# Patient Record
Sex: Male | Born: 1940
Health system: Southern US, Community
[De-identification: ages and names within clinical notes are randomized; demographics above are authoritative.]

## PROBLEM LIST (undated history)

## (undated) DIAGNOSIS — M109 Gout, unspecified: Secondary | ICD-10-CM

## (undated) DIAGNOSIS — T4145XA Adverse effect of unspecified anesthetic, initial encounter: Secondary | ICD-10-CM

## (undated) DIAGNOSIS — Z8719 Personal history of other diseases of the digestive system: Secondary | ICD-10-CM

## (undated) DIAGNOSIS — I5042 Chronic combined systolic (congestive) and diastolic (congestive) heart failure: Secondary | ICD-10-CM

## (undated) DIAGNOSIS — M1990 Unspecified osteoarthritis, unspecified site: Secondary | ICD-10-CM

## (undated) DIAGNOSIS — I4510 Unspecified right bundle-branch block: Secondary | ICD-10-CM

## (undated) DIAGNOSIS — T8859XA Other complications of anesthesia, initial encounter: Secondary | ICD-10-CM

## (undated) DIAGNOSIS — I10 Essential (primary) hypertension: Secondary | ICD-10-CM

## (undated) DIAGNOSIS — Z87442 Personal history of urinary calculi: Secondary | ICD-10-CM

## (undated) DIAGNOSIS — I480 Paroxysmal atrial fibrillation: Secondary | ICD-10-CM

## (undated) DIAGNOSIS — Z8679 Personal history of other diseases of the circulatory system: Secondary | ICD-10-CM

## (undated) DIAGNOSIS — K5790 Diverticulosis of intestine, part unspecified, without perforation or abscess without bleeding: Secondary | ICD-10-CM

## (undated) DIAGNOSIS — I452 Bifascicular block: Secondary | ICD-10-CM

## (undated) DIAGNOSIS — Z7901 Long term (current) use of anticoagulants: Secondary | ICD-10-CM

## (undated) HISTORY — PX: AORTIC VALVE REPLACEMENT: SHX41

## (undated) HISTORY — DX: Gout, unspecified: M10.9

## (undated) HISTORY — DX: Chronic combined systolic (congestive) and diastolic (congestive) heart failure: I50.42

## (undated) HISTORY — DX: Personal history of urinary calculi: Z87.442

## (undated) HISTORY — DX: Paroxysmal atrial fibrillation: I48.0

## (undated) HISTORY — PX: CATARACT EXTRACTION W/ INTRAOCULAR LENS  IMPLANT, BILATERAL: SHX1307

---

## 1999-07-13 ENCOUNTER — Ambulatory Visit (HOSPITAL_COMMUNITY): Admission: RE | Admit: 1999-07-13 | Discharge: 1999-07-13 | Payer: Self-pay | Admitting: Family Medicine

## 2002-07-30 ENCOUNTER — Ambulatory Visit (HOSPITAL_COMMUNITY): Admission: RE | Admit: 2002-07-30 | Discharge: 2002-07-30 | Payer: Self-pay | Admitting: Family Medicine

## 2002-07-30 ENCOUNTER — Encounter: Payer: Self-pay | Admitting: Cardiology

## 2003-04-09 ENCOUNTER — Encounter: Payer: Self-pay | Admitting: Cardiothoracic Surgery

## 2003-04-13 ENCOUNTER — Inpatient Hospital Stay (HOSPITAL_COMMUNITY): Admission: RE | Admit: 2003-04-13 | Discharge: 2003-04-23 | Payer: Self-pay | Admitting: Cardiothoracic Surgery

## 2003-04-14 ENCOUNTER — Encounter: Payer: Self-pay | Admitting: Cardiothoracic Surgery

## 2003-04-14 ENCOUNTER — Encounter (INDEPENDENT_AMBULATORY_CARE_PROVIDER_SITE_OTHER): Payer: Self-pay | Admitting: Specialist

## 2003-04-15 ENCOUNTER — Encounter: Payer: Self-pay | Admitting: Cardiothoracic Surgery

## 2003-04-16 ENCOUNTER — Encounter: Payer: Self-pay | Admitting: Cardiothoracic Surgery

## 2003-04-17 ENCOUNTER — Encounter: Payer: Self-pay | Admitting: Cardiothoracic Surgery

## 2003-05-01 ENCOUNTER — Encounter: Payer: Self-pay | Admitting: Emergency Medicine

## 2003-05-01 ENCOUNTER — Inpatient Hospital Stay (HOSPITAL_COMMUNITY): Admission: EM | Admit: 2003-05-01 | Discharge: 2003-05-04 | Payer: Self-pay | Admitting: Emergency Medicine

## 2003-05-26 ENCOUNTER — Inpatient Hospital Stay (HOSPITAL_COMMUNITY): Admission: AD | Admit: 2003-05-26 | Discharge: 2003-06-05 | Payer: Self-pay | Admitting: Internal Medicine

## 2003-10-04 ENCOUNTER — Inpatient Hospital Stay (HOSPITAL_COMMUNITY): Admission: RE | Admit: 2003-10-04 | Discharge: 2003-10-14 | Payer: Self-pay | Admitting: Surgery

## 2003-10-05 ENCOUNTER — Encounter (INDEPENDENT_AMBULATORY_CARE_PROVIDER_SITE_OTHER): Payer: Self-pay | Admitting: Specialist

## 2003-10-06 ENCOUNTER — Encounter (INDEPENDENT_AMBULATORY_CARE_PROVIDER_SITE_OTHER): Payer: Self-pay | Admitting: *Deleted

## 2003-10-06 HISTORY — PX: HEMICOLECTOMY: SHX854

## 2006-05-29 ENCOUNTER — Ambulatory Visit: Payer: Self-pay | Admitting: Internal Medicine

## 2006-07-04 ENCOUNTER — Ambulatory Visit: Payer: Self-pay | Admitting: Internal Medicine

## 2006-07-12 ENCOUNTER — Ambulatory Visit: Payer: Self-pay | Admitting: Pulmonary Disease

## 2006-07-19 ENCOUNTER — Inpatient Hospital Stay (HOSPITAL_COMMUNITY): Admission: AD | Admit: 2006-07-19 | Discharge: 2006-07-24 | Payer: Self-pay | Admitting: *Deleted

## 2006-07-23 ENCOUNTER — Encounter (INDEPENDENT_AMBULATORY_CARE_PROVIDER_SITE_OTHER): Payer: Self-pay | Admitting: Specialist

## 2006-07-23 HISTORY — PX: LAPAROSCOPIC CHOLECYSTECTOMY: SUR755

## 2006-08-07 ENCOUNTER — Ambulatory Visit: Payer: Self-pay | Admitting: Internal Medicine

## 2006-08-08 ENCOUNTER — Ambulatory Visit: Payer: Self-pay | Admitting: Cardiology

## 2006-08-27 ENCOUNTER — Ambulatory Visit: Payer: Self-pay | Admitting: Internal Medicine

## 2006-10-24 ENCOUNTER — Ambulatory Visit: Payer: Self-pay | Admitting: Internal Medicine

## 2007-09-24 ENCOUNTER — Encounter: Payer: Self-pay | Admitting: Internal Medicine

## 2007-11-04 DIAGNOSIS — K219 Gastro-esophageal reflux disease without esophagitis: Secondary | ICD-10-CM | POA: Insufficient documentation

## 2007-11-04 DIAGNOSIS — R05 Cough: Secondary | ICD-10-CM

## 2007-11-04 DIAGNOSIS — I10 Essential (primary) hypertension: Secondary | ICD-10-CM | POA: Insufficient documentation

## 2007-11-04 DIAGNOSIS — R059 Cough, unspecified: Secondary | ICD-10-CM | POA: Insufficient documentation

## 2007-11-04 DIAGNOSIS — J310 Chronic rhinitis: Secondary | ICD-10-CM | POA: Insufficient documentation

## 2007-11-05 ENCOUNTER — Ambulatory Visit: Payer: Self-pay | Admitting: Internal Medicine

## 2008-04-28 ENCOUNTER — Encounter: Payer: Self-pay | Admitting: Cardiology

## 2008-04-28 HISTORY — PX: TRANSTHORACIC ECHOCARDIOGRAM: SHX275

## 2010-04-21 ENCOUNTER — Ambulatory Visit: Payer: Self-pay | Admitting: Cardiology

## 2010-08-13 ENCOUNTER — Encounter: Payer: Self-pay | Admitting: Internal Medicine

## 2010-12-09 NOTE — Op Note (Signed)
[name], [name]              ACCOUNT NO.:  [id number]   MEDICAL RECORD NO.:  [id number]          PATIENT TYPE:  INP   LOCATION:  5732                         FACILITY:  [facility]   PHYSICIAN:  Lebron Conners, M.D.   DATE OF BIRTH:  05/16/1941   DATE OF PROCEDURE:  07/23/2006  DATE OF DISCHARGE:                               OPERATIVE REPORT   PREOPERATIVE DIAGNOSIS:  Cholelithiasis, recent pancreatitis.   POSTOPERATIVE DIAGNOSIS:  Cholelithiasis, recent pancreatitis.   OPERATION:  Laparoscopic cholecystectomy with operative cholangiogram.   SURGEON:  Lebron Conners, M.D.   ANESTHESIA:  General and local.   BLOOD LOSS:  Minimal.   COMPLICATIONS:  None.   SPECIMEN:  Gallbladder to the lab.   The patient to PACU in excellent condition.   PROCEDURE:  After the patient was monitored and asleep and had routine  preparation and draping of the abdomen, I infused the area just below  the umbilicus with local anesthetic and then made about a 3 cm  transverse incision, dissected down through the fat and cut the fascia  in the midline for about 2 cm then bluntly entered the peritoneal  cavity.  I saw some adhesions in the right lower quadrant but no other  adhesions.  I saw that after putting in the scope which was secured with  a 0 Vicryl pursestring suture in the fascia utilizing a Hassan cannula.  The gallbladder did not appear acutely inflamed.  There was some  swelling of the retroperitoneum.  I put in three additional laparoscopic  ports under direct view through anesthetized sites.  Then with the  patient positioned head-up, foot down and rotated to the left, I  retracted the fundus of the gallbladder toward the head and pulled the  infundibulum laterally.  No adhesions were present.  The hepatoduodenal  ligament was quite fatty, but I dissected it until I saw the cystic duct  and the cystic artery quite clearly.  I clipped and divided the cystic  artery, then I clipped the  cystic duct as it emerged from the  infundibulum and made a small cut in it just distal to that and  cannulated it with a Cook cholangiogram catheter.  Utilizing  fluoroscopy, I performed a cholangiogram, demonstrated normal-sized  ducts with free flow into the duodenum and no evident filling defects.  I then removed the cholangiogram catheter and clipped the distal cystic  duct with three clips and divided it.  I removed the gallbladder from  the liver utilizing the cautery and mostly using the hook dissection.  I  accidentally made a couple of holes in the gallbladder and spilled quite  a bit of bile and a few small gallstones.  I took care to collect all  the spillage.  After detaching the gallbladder from the liver, I placed  it in a plastic pouch and held it above the liver while I copiously  irrigated the area and removed the irrigant.  I was satisfied with  hemostasis, but because of that need to reinstitute heparinization in  the immediate postop period, I packed the gallbladder fossa with  Surgicel.  The sponge, needle and instrument counts were correct.  I  removed the gallbladder from the abdomen through the umbilical incision  and tied the  pursestring suture.  I saw no bleeding from that site and saw that no  viscera were trapped.  I removed the two lateral ports under direct view  and saw no bleeding.  After allowing the carbon dioxide to escape, I  removed the epigastric port.  I closed all skin incisions with  intracuticular 4-0 Vicryl and Steri-Strips.  He tolerated the operation  well.      Lebron Conners, M.D.  Electronically Signed     WB/MEDQ  D:  07/23/2006  T:  07/23/2006  Job:  [id number]

## 2010-12-09 NOTE — H&P (Signed)
NAME:  Adrian Lambert, Adrian Lambert                        ACCOUNT NO.:  [id number]   MEDICAL RECORD NO.:  [id number]                   PATIENT TYPE:  INP   LOCATION:  NA                                   FACILITY:  [facility]   PHYSICIAN:  Peter M. Swaziland, M.D.               DATE OF BIRTH:  [birth year]   DATE OF ADMISSION:  04/13/2003  DATE OF DISCHARGE:                                HISTORY & PHYSICAL   HISTORY OF PRESENT ILLNESS:  The patient is a 70 year old white male who in  general has been in excellent health.  Approximately eight months ago he was  noted to have a murmur.  This led to an echocardiogram in January 2004 which  demonstrated normal left ventricular function.  The aortic valve was  thickened.  It was a suboptimal study and Doppler demonstrated a peak aortic  valve gradient of 30 mmHg and a mean gradient of 20 mmHg.  At that time his  valve area was estimated at 1 cm squared.  Since that time the patient has  been followed medically.  More recently, he has been noticing that he gets  short of breath when he climbs up steps or walks up a hill or has an  extended walk.  Sometimes when he is playing with his grandchildren or  lifting something he will also get tightness in his chest.  He occasionally  gets lightheaded if he bends over or gets up too quickly.  He was re-  evaluated with an echocardiogram on February 04, 2003.  This showed a  significant increase in his aortic valve gradient to a peak gradient to 69  mmHg, a mean gradient of 40 mmHg and an aortic valve area of 0.52 cm  squared.  This was felt to be a technically adequate study and superior to  his prior study.  Given these findings of critical aortic stenosis and new  symptoms, it was recommended that he be considered for aortic valve  replacement.  He subsequently was seen by Dr. Tyrone Sage and is now admitted  for cardiac catheterization with subsequent aortic valve surgery.   PAST MEDICAL HISTORY:  1. History of  hypertension.  2. History of kidney stones.  3. He has no history of hypercholesterolemia or diabetes.   ALLERGIES:  He has no known allergies.   CURRENT MEDICATIONS:  1. Norvasc 10 mg per day.  2. Avapro 150 mg per day.  3. Ibuprofen 800 mg per day.   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  He is married  and has two children.  He denies tobacco use.  He drinks a couple of beers  on the weekends.   FAMILY HISTORY:  Father is age 5 and has hypertension.  Mother is age 28  with a history of coronary artery disease and diabetes.  Two siblings are  alive and well.   REVIEW OF  SYMPTOMS:  VASCULAR:  The patient has had no history of  claudication.  NEUROLOGIC:  He has no history of TIA or stroke.  HEMATOLOGIC:  No history of bleeding difficulties.  He does have a history  of gout.   PHYSICAL EXAMINATION:  GENERAL:  The patient is a pleasant white male in no  distress.  He is obese.  VITAL SIGNS:  Weight is 245, blood pressure 138/80, pulse 70 and regular.  HEENT EXAM:  Pupils equal, round and reactive to light and accommodation.  Extraocular movements are full.  The oropharynx is clear.  The neck is  without JVD, adenopathy or bruits.  Carotid upstrokes are delayed.  LUNGS:  Clear.  CARDIAC EXAM:  Regular rate and rhythm with a harsh grade 2/6 systolic  murmur heard in the aortic area radiating to the carotids and left sternal  border.  There is no S3.  ABDOMEN:  Obese, soft and non-tender.  There are no masses or  hepatosplenomegaly.  Femoral and pedal pulses are 2+ and symmetric.  He is  without edema.  NEUROLOGIC EXAM:  Nonfocal.   LABORATORY DATA:  Resting EKG is normal.  Chest x-ray shows cardiomegaly and  is otherwise normal.  Subsequent laboratories are pending.   IMPRESSION:  1. Critical aortic stenosis now with symptoms of dyspnea and occasional     chest pain.  2. Hypertension.   PLAN:  Admit for cardiac catheterization and plans for aortic  valve  replacement on April 14, 2003 by Dr. Tyrone Sage.                                                Peter M. Swaziland, M.D.    PMJ/MEDQ  D:  04/09/2003  T:  04/10/2003  Job:  [id number]   cc:   Caryn Bee L. Little, M.D.  [address]  [place]  [place] [address]  Fax: [phone]   Gwenith Daily. Tyrone Sage, M.D.  [address]  [place]  [place] [address]

## 2010-12-09 NOTE — H&P (Signed)
NAME:  Adrian Lambert, Adrian Lambert              ACCOUNT NO.:  [id number]   MEDICAL RECORD NO.:  [id number]          PATIENT TYPE:  INP   LOCATION:  5707                         FACILITY:  [facility]   PHYSICIAN:  Corinna L. Lendell Caprice, MDDATE OF BIRTH:  1941-05-19   DATE OF ADMISSION:  07/19/2006  DATE OF DISCHARGE:                              HISTORY & PHYSICAL   CHIEF COMPLAINT:  Pancreatitis.   HISTORY OF PRESENT ILLNESS:  Adrian Lambert is a pleasant 70 year old white  male patient who was diagnosed with acute pancreatitis as an outpatient  and has been directly admitted for treatment, and he reports having  sudden onset of upper abdominal pain starting last night.  He has also  had several episodes of vomiting.  He has been unable to keep much down.  He has no previous history of pancreatitis.  He drinks several drinks a  day.  Denies a binge of alcohol recently.  He has no previous liver  disease.  He had laboratories as an outpatient, which showed elevated  amylase, lipase and liver function tests.  He also had an ultrasound  done at [facility], which showed mild gallbladder sludge, no  gallstones, and a normal common duct caliber.  He also reportedly had a  CT of the abdomen.  Wet reading is reportedly unremarkable.   PAST MEDICAL HISTORY:  1. 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  2. 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  3. History of postoperative atrial fibrillation in 2004.  4. History of gout.  5. Hypertension.  6. Benign prostatic hypertrophy.  7. Pulmonary fibrosis.  8. Chronic cough, for which she sees Dr. Sherene Sires.   MEDICATIONS:  1. Exforge 5/160 mg a day.  2. Colchicine as needed.  3. Benefiber as needed.  4. Coumadin 5 mg nightly except for Saturday, 2.5 mg.  5. UroXatral 10 mg a day.   SOCIAL HISTORY:  The patient drinks 2-6 drinks on occasion.  He does not  smoke.  He is married  and is here with his daughter, who is a short Engineer, mining.   FAMILY HISTORY:  Reviewed and as per previous dictations.   REVIEW OF SYSTEMS:  As above.   PHYSICAL EXAMINATION:  VITAL SIGNS:  His temperature is 101.2, pulse  112, respiratory rate 20, blood pressure 90/69.  GENERAL:  The patient is well-nourished, well-developed, in a mild  amount of pain.  HEENT:  Normocephalic, atraumatic.  Pupils equal, round, reactive to  light.  Moist mucous membranes.  NECK:  Supple.  No JVD.  No carotid bruits.  LUNGS:  Clear to auscultation bilaterally without wheezes, rhonchi or  rales.  CARDIOVASCULAR:  Regular rate and rhythm.  Has has a mechanical click  and murmur.  ABDOMEN:  Normal bowel sounds, soft.  He does have some voluntary  guarding and tenderness in the upper abdominal area.  GU, RECTAL:  Deferred.  EXTREMITIES:  No clubbing, cyanosis or edema.  SKIN:  No rash.  PSYCHIATRIC:  Normal affect.  NEUROLOGIC:  Alert and oriented.  Cranial nerves and  sensorimotor exam  are intact.   Labs from the office show an amylase of greater than 900, lipase of  greater than 400.  Total protein 6.2, albumin 4.4, total bilirubin 3.1,  direct bilirubin 1.5, alkaline phosphatase 110, AST 285, ALT 163.  White  blood cell count 18.8 with 91% neutrophils, hemoglobin 13.8, hematocrit  43, platelet count 217.  Ultrasound from [facility] shows  mild gallbladder sludge, no gallstones.  CT scan from [facility]:  Official report is pending.   ASSESSMENT AND PLAN:  1. Acute pancreatitis, consider alcohol-related, consider a passed      gallstone.  I will repeat his labs here.  If he continues to be      febrile, may need to start empiric antibiotics.  Get an MRCP due to      his mechanical aortic valve and if there is concern about      obstruction, he may need a GI consult to evaluate for possible      ERCP.  I will also check a triglyceride level.  2. Aortic valve replacement.   I will check INR and continue Coumadin.  3. Hypertension.  Hold his medications for now.  4. History of gout, quiescent.  5. Benign prostatic hypertrophy.      Corinna L. Lendell Caprice, MD  Electronically Signed     CLS/MEDQ  D:  07/20/2006  T:  07/20/2006  Job:  [id number]   cc:   Caryn Bee L. Little, M.D.

## 2010-12-09 NOTE — Op Note (Signed)
NAME:  Adrian Lambert, Adrian Lambert                        ACCOUNT NO.:  [id number]   MEDICAL RECORD NO.:  [id number]                   PATIENT TYPE:  INP   LOCATION:  2315                                 FACILITY:  [facility]   PHYSICIAN:  Gwenith Daily. Tyrone Sage, M.D.            DATE OF BIRTH:  [birth year]   DATE OF PROCEDURE:  04/14/2003  DATE OF DISCHARGE:                                 OPERATIVE REPORT   PREOPERATIVE DIAGNOSES:  Critical aortic stenosis.   POSTOPERATIVE DIAGNOSES:  Critical aortic stenosis.   OPERATION PERFORMED:  Aortic valve replacement with a #25 aortic mechanical  valve prosthesis.   SURGEON:  Gwenith Daily. Tyrone Sage, M.D.   ASSISTANT:  Salvatore Decent. Dorris Fetch, M.D.   ANESTHESIA:  General.   INDICATIONS FOR PROCEDURE:  The patient is a 70 year old male who has had a  known murmur for several years, who presents with increasing shortness of  breath and fatigue with serial echocardiograms done over the past 12 to 18  months that showed progression of critical aortic stenosis.  Over  approximately a nine to 10 month period, his aortic valve area went from 1.2  to 0.5.  With his increased symptoms and progression of aortic stenosis,  aortic valve replacement was recommended to the patient. He was admitted for  cardiac catheterization to rule out concomitant coronary artery disease.  This was done by Dr. Swaziland demonstrating the valve was unable to be  crossed.  The coronaries were clear of significant obstructive disease.  The  patient had no contraindications to Coumadin and after discussion preferred  mechanical valve prosthesis and signed informed consent.   DESCRIPTION OF PROCEDURE:  With Swann-Ganz and arterial line monitors in  place, the patient underwent general endotracheal anesthesia without  incident.  The skin of the chest and legs was prepped with Betadine and  draped in the usual sterile manner.  A median sternotomy was performed.  The  pericardium was opened.  The  patient had evidence of left ventricular  hypertrophy.  The patient was systemically heparinized.  The ascending aorta  and the right atrium were cannulated in the aortic root.  A bent  cardioplegia needle was introduced into the ascending aorta.  The patient  was placed on cardiopulmonary bypass at 2.4L per minute per meter squared.  A right superior pulmonary vein vent was then placed.  The patient's body  temperature was cooled to 30 degrees.  An aortic crossclamp was applied.  of cold blood potassium cardioplegia was administered with rapid  diastolic arrest of the heart.  Myocardial septal temperature was monitored  throughout the crossclamp period.  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  The right  side cuff was also severely limited but to a lesser degree.  The valve was  excised, the annulus debrided.  The valve was sized with a 25 St. Jude  mechanical aortic valve prosthesis, model [id number], serial  [id number].  With  #2 Tycron pledgeted sutures on the ventricular surface were placed  circumferentially in the annulus.  With these sutures, the valve was then  secured in place and seated well with free movement of the leaflets.  There  was no impingement on the left or right coronary ostium.  The aortotomy was  then closed with horizontal mattress suture with felt strips.  Prior to  complete closure of the aortotomy, the heart was allowed to passively fill  and deair.  Aortic cross-clamp was removed.  Total crossclamp time was 80  minutes.  A 16 gauge needle was introduced into the left ventricular apex to  deair the heart.  The patient initially was in complete heart block;  however, prior to the end of the case, he converted to a sinus rhythm.  Atrial and ventricular pacing wires were applied.  The patient was rewarmed  to 37 degrees.   He was then ventilated and weaned from cardiopulmonary  bypass without difficulty.  He remained hemodynamically stable, was  decannulated in the usual fashion.  Protamine sulfate was administered.  With the operative field hemostatic, two atrial and two ventricular pacing  wires were applied.  Graft markers were applied.  A left pleural tube and  two mediastinal tubes were left in place.  Sternum was closed with #6  stainless steel wire.  Fascia closed with interrupted 0 Vicryl, running 3-0  Vicryl in the subcutaneous tissues and 4-0 subcuticular stitch in the skin  edges.  Dry dressings were applied.  Sponge and needle counts were reported  as correct at the completion of the procedure.  The patient tolerated the  procedure without obvious complication and was transferred to the surgical  intensive care unit for further postoperative care.                                                 Gwenith Daily Tyrone Sage, M.D.    Tyson Babinski  D:  04/15/2003  T:  04/15/2003  Job:  [id number]   cc:   Peter M. Swaziland, M.D.  [address]  [place], [place] [address]  Fax: [phone]

## 2010-12-09 NOTE — Op Note (Signed)
NAME:  Adrian Lambert, Adrian Lambert                        ACCOUNT NO.:  [id number]   MEDICAL RECORD NO.:  [id number]                   PATIENT TYPE:  INP   LOCATION:  2315                                 FACILITY:  [facility]   PHYSICIAN:  Burna Forts, M.D.             DATE OF BIRTH:  1941-06-14   DATE OF PROCEDURE:  04/14/2003  DATE OF DISCHARGE:                                 OPERATIVE REPORT   PROCEDURE PERFORMED:  Intraoperative transesophageal echocardiogram.   INDICATIONS FOR PROCEDURE:  Adrian Lambert is a 70 year old gentleman, a  patient of Edward B. Tyrone Sage, M.D., who presents for aortic valve  replacement.  The morning of surgery, he was brought to the holding area  where under local anesthesia with sedation, the pulmonary artery catheter  and radial arterial lines are inserted.  He was then transported to the  operating room for routine induction of general anesthesia, whereupon the  trachea was intubated, the TE probe was then lubricated and passed  oropharyngeally into the stomach and then withdrawn for imaging of the  cardiac structures.   PRECARDIOPULMONARY BYPASS TRANSESOPHAGEAL ECHOCARDIOGRAM EXAMINATION:  Left ventricle:  This is a minimally thickened left ventricular chamber.  All segments appear to contract in a short axis view; however, I am  impressed by what appears to be global depression of the cardiac function in  this short axis view.  Long axis view again reveals anterior and posterior  walls that are contractile but globally depressed in their function.  Papillary muscles are well outlined and seen.  In the long axis view, peak  and mean gradients are measured at about 48 and mean about 33.  The aortic  valve area is calculated at about 0.6.   Mitral valve:  This is a thin, compliant, mobile mitral valve apparatus.  Coaptation appears to take place below the level of the annulus as is  suspected.  There is a trace branch of regurgitant flow noted.   Aortic  valve:  This is a heavily calcified aortic valve apparatus.  It is  essentially bicuspid in its shape and function.  There is a noncoronary  leaflet portion that appears to be mobile.  The other what would have been  right and left leaflets which are either fused or a single leaflet are  essentially immobile.  On the long axis view there is 4+ turbulent flow  across the valve.  There is no aortic regurgitant flow noted.  Aortic valve  area by planimetry is again approximately 0.6 to 0.7 by planimetry.   Left atrium:  Normal left atrial chamber.  The appendage is seen and masses  are noted within.   Right ventricle, tricuspid valve and right atrium:  Essentially normal.   The patient was placed on cardiopulmonary bypass, hypothermia was begun. The  diseased aortic valve was excised and replaced with a #25 St. Jude  bivalvular apparatus.  Rewarming and deairing maneuvers were  carried out.  The patient then separated from cardiopulmonary bypass with the initial  attempt.   POST CARDIOPULMONARY BYPASS TRANSESOPHAGEAL ECHOCARDIOGRAM EXAMINATION  (LIMITED EXAM):  Left ventricle:  The left ventricular contractility is as previously  described which is globally depressed in the early postoperative period.  However, with the addition of inotropes and pacing, there does appear to be  improvement of the global left ventricular function in a short axis view.  Again, all segments appear to be moving inward and contractile.   Aortic valve:  Replacement of diseased aortic valve could now be seen.  The  bivalvular leaflets of this St. Jude apparatus.  The valve itself appeared  to be seated well in the appropriate fashion, appeared to be functioning  entirely in a normal manner.  Leaflets were mobile and the long axis view  revealed normal flow across this valve with essentially no regurgitant flow  noted.   The rest of the cardiac exam was as previously described and the patient  returned to the  cardiac intensive care unit in stable condition.                                               Burna Forts, M.D.    JTM/MEDQ  D:  04/14/2003  T:  04/15/2003  Job:  [id number]

## 2010-12-09 NOTE — Consult Note (Signed)
NAMEHALDEN, Adrian Lambert              ACCOUNT NO.:  [id number]   MEDICAL RECORD NO.:  [id number]          PATIENT TYPE:  INP   LOCATION:  5732                         FACILITY:  [facility]   PHYSICIAN:  Shirley Friar, MDDATE OF BIRTH:  27-Feb-1941   DATE OF CONSULTATION:  07/20/2006  DATE OF DISCHARGE:                                 CONSULTATION   REASON FOR CONSULTATION:  Abnormal LFT's.  Evaluate for pancreatitis.   HISTORY OF PRESENT ILLNESS:  A 70 year old white male who was in his  usual state of health until the day after Christmas when he developed  the acute onset of abdominal pain that he thought was indigestion.  It  did occur while he was at a Christmas party and was associated with dry  heaving x2 and vomiting x3.  He saw his physician the following day and  was sent for an ultrasound, which showed gallbladder sludge and no  gallstones.  The ultrasound was done at an outside facility.  A CT scan  was also done and was reportedly unremarkable, although the dictation is  not available at the time of this consult.  He had a temperature 103.0  this morning on admission and was found to have an amylase of 506 and  lipase of 1506.  His total bilirubin was 2.9, AST 218, ALT 247 with a  normal alkaline phosphatase of 92.  His white blood count was 19.1.  He  reportedly has been drinking 6 drinks a day for the last several weeks.  He normally drinks a 6-pack on the weekends.  He denies any past history  of gallstone problems or pancreatitis.  His lipase has gone down to 718  and amylase of 292.  His transaminases have also declined down to an AST  of 120, an ALT of 186.  His total bilirubin was slightly higher at 3.1  with indirect fraction of 2.2.  He is on chronic Coumadin secondary to  aortic valve replacement with an INR of 2.0 on admission.  Currently, he  is afebrile with decreased pain and no further vomiting.  Upon initial  discussion of his case with Dr. Lendell Caprice, I have  recommended a  pancreatic CT, and the results of that show possible acute cholecystitis  without evidence of acute pancreatitis.   PAST MEDICAL HISTORY:  1. 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  2. History of diverticular abscess status post laparoscopic-assisted      left hemicolectomy in 2005.  3. History of gout.  4. History of hypertension.  5. Multiple fibrosis.  6. Chronic cough.  7. History of postoperative atrial fibrillation in 2004.  8. BPH.   MEDICATIONS UPON ADMISSION:  1. Exforge, which is Norvasc and valsartan combination.  2. Colchicine as needed.  3. Coumadin.  4. UroXatral.   ALLERGIES:  1. ACE INHIBITORS.  2. CELEBREX.   SOCIAL HISTORY:  Drinks a 6-pack on the weekends.  Recently has been  drinking 6 drinks a day.  Denies smoking.   REVIEW OF SYSTEMS:  As stated above.   PHYSICAL EXAMINATION:  VITAL SIGNS:  Temperature 98.9, pulse 89, blood  pressure 102/58, O2 saturations 95% on room air.  GENERAL:  Alert and in no acute distress.  CHEST:  Clear to auscultation anteriorly.  CARDIOVASCULAR:  Regular regular and rhythm.  ABDOMEN:  Mild epigastric and right upper quadrant tenderness, soft,  nondistended, positive bowel sounds.  Obese.   LABORATORY DATA:  White blood count 14.9, hemoglobin 12, platelet count  141, INR of 2.0, total bilirubin 3.1.  AST 120, ALT 186, amylase 292,  lipase 718 (down from amylase of 506 and lipase over 1500 ).   IMPRESSION:  A 70 year old white male with a history as noted above, and  based on his level of lipase elevation and an equal ratio of AST to ALT,  I think he may have passed a gallstone causing biliary pancreatitis.  Despite his normal ALT, I think he must have passed a gallstone leading  to his constellation of symptoms.  At this time, I do not think he has  any evidence of cholangitis.  However, I will change his antibiotic to  Unasyn and recommend an abdominal ultrasound.   Currently, no indication  for urgent ERCP, but may need to reconsider this procedure if his LFTs  rise or if he has dilated bile ducts on ultrasound or onset of  cholangitis/sepsis.   Will follow closely.  Thank you for the consult.      Shirley Friar, MD  Electronically Signed     VCS/MEDQ  D:  07/20/2006  T:  07/21/2006  Job:  [id number]

## 2010-12-09 NOTE — Assessment & Plan Note (Signed)
[facility]                               PULMONARY OFFICE NOTE   NAME:SEYMOURDemetrias, Goodbar                     MRN:          [id number]  DATE:05/29/2006                            DOB:          [birth year]    HISTORY:  This is a 70 year old white male, never smoker, seen here  previously for what was felt to be an ACE cough by Dr. Jayme Cloud in 2004. The  records indicate that after the ACE inhibitor was stopped his cough was  completely eliminated and his breathing was much better. Subsequently, I  understand he has undergone an aortic valve replacement and is back now  complaining of progressively worsening dyspnea to the point where he can  only do slow ADLs without giving out and actually has worsened subsequent  to the valve replacement and has been associated with about a 10-pound  weight gain. He says that his dyspnea is about the same every day and is  associated with hoarseness and a dry cough. He did not actually recall his  previous conversation with Dr. Jayme Cloud regarding the nature of his cough or  the fact that he was intolerant to ACE inhibitors, which is labeled by our  charts as an intolerance to ACE inhibitors.   The patient denies any excessive sputum production, orthopnea, PND or  fevers, chills or sweats, overt sinus or reflux symptoms.   PAST MEDICAL HISTORY:  Significant for:  1. Hypertension, for which he is on Lotrel.  2. Status post remote colon surgery for diverticulitis.  3. Heart valve replacement surgery by Dr. Tyrone Sage in October 2004.  4. Also, has a history of nephrolithiasis.   ALLERGIES:  None known.   MEDICATIONS:  Taken include Lotrel, Coumadin, colchicine, Flomax and fiber.   SOCIAL HISTORY:  He has never smoked. 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   FAMILY HISTORY:  Positive for emphysema and heart disease and negative for  atopy.   REVIEW OF SYSTEMS:  Taken in detail  on the worksheet and is significant for  the problems as outlined above.   PHYSICAL EXAMINATION:  This is a slightly hoarse, white male with classic  voice fatigue and throat clearing during the interview and exam.  HEENT: However, was normal. Oropharynx was clear. Dentition was intact.  Nasal turbinates normal. Ear canals clear bilaterally.  NECK: Was supple without cervical adenopathy, tenderness. Trachea is midline  without organomegaly.  LUNGS: Lung fields perfectly clear bilaterally to auscultation and  percussion with minimal pseudo wheeze present. He did have a few crackles on  inspiration in the bases.  Heart is a regular rhythm without murmur, gallop or rub.  ABDOMEN: Obese, otherwise benign.  EXTREMITIES: Are warm without calf tenderness, cyanosis, clubbing or edema.   CT scan of the abdomen was reviewed from May 10, 2006, indicating very  mild fibrotic changes in the bases only bilaterally.   IMPRESSION:  1. Classic upper airway syndrome of coughing and dyspnea that is      disproportionate to objective findings and consistent with ACE  inhibitor intolerance, which was the same diagnosis made here three      years ago. Although intuitively one would think that if an ACE      inhibitor causes a problem there ought to be a clear line between the      cause and effect, this is not always the case. In my experience,      usually it takes a second insult to bring out ACE inhibitor intolerance      such as an endotracheal tube, upper respiratory infection, an allergy      or reflux, for which this patient is at risk based on his body habitus.   I therefore recommended that he substitute Enforge 5/160 one daily for the  next six weeks and then return here for PFTs to evaluate problem #2.   1. Mild fibrotic changes appear on CT scan are probably unrelated and      chronic and do not represent pulmonary fibrosis. However, I would like      the patient to return for a full  set of PFTs and a plain x-ray to see      if there is any microscopic evidence of pulmonary fibrosis when he      returns.    ______________________________  Charlaine Dalton Sherene Sires, MD, [facility]    MBW/MedQ  DD: 05/29/2006  DT: 05/29/2006  Job #: [id number]   cc:   Bertram Millard. Dahlstedt, M.D.  Anna Genre Little, M.D.  Peter M. [place], M.D.

## 2010-12-09 NOTE — H&P (Signed)
NAME:  Adrian Lambert, Adrian Lambert                        ACCOUNT NO.:  [id number]   MEDICAL RECORD NO.:  [id number]                   PATIENT TYPE:  INP   LOCATION:  2017                                 FACILITY:  [facility]   PHYSICIAN:  Sherin Quarry, MD                   DATE OF BIRTH:  09-15-1940   DATE OF ADMISSION:  05/01/2003  DATE OF DISCHARGE:                                HISTORY & PHYSICAL   HISTORY OF PRESENT ILLNESS:  Adrian Lambert is a 70 year old man who  underwent aortic valve replacement with a St. Jude mechanical aortic valve  during his hospitalization of April 13, 2003.  His postoperative course  was marked by development of atrial fibrillation which apparently has  resolved.  Since his discharge he has been doing reasonably well and has  been receiving ongoing physical therapy.  However, several problems have  occurred recently which led to his presentation to the emergency room.  First, tonight the patient was brushing his teeth when he felt intensely  dizzy.  He stumbled to his chair and sat down where his wife observed him to  apparently black out for a period of about 30 seconds.  On arousal he felt  very weak and lightheaded.  He was extremely diaphoretic.  He did not seem  to be experiencing any obvious chest pain.  He was not short of breath.  His  wife contacted [facility] and was advised to transport the  patient to the emergency room.  In the emergency room he was noted to be in  a sinus rhythm and had no recurrence of the syncopal episode.  Two, on  discharge from the hospital the patient was noted to have a phlebitic vein  in his right arm.  He was given a seven day course of Keflex and since he  has been taking this medication the redness and tenderness in area of the  vein apparently has substantially improved.  He still has an indurated,  somewhat tender vein in the antecubital fossa of the right arm.  Tonight the  patient was noted to  have a temperature of 100.2.  Review of the serial  chest x-rays obtained showed that he has resolving atelectasis at the bases  and his current chest x-ray looks substantially better than the previous  one.  Evaluation of fever will be important.  Third, the patient is  currently having an acute episode of gout presenting as podagra involving  the left great toe.  He says that he has gout about one to two times per  year and that he normally treats this with Indocin.  In light of all these  problems, the patient is being admitted for further evaluation and  treatment.   MEDICATIONS:  1. Coumadin 2.5 mg daily.  2. Amiodarone 400 mg b.i.d.  3. Lopressor 25 mg b.i.d.  4. Ultram p.r.n.  for pain.  5. Colchicine 0.6 mg which he has taken one dose for gout.   PAST MEDICAL HISTORY:  1. Hypertension.  The patient has a history of well controlled hypertension.     There is no previous history of coronary artery disease, angina, or     exertional chest pain.  2. History of kidney stones.  The patient has had episodes of kidney stone     in the past.  He has never required any surgical procedure or     lithotripsy.   OPERATION:  The only operation he has had was the previously mentioned  aortic valve replacement.   FAMILY HISTORY:  The patient's mother has congestive heart failure and  diabetes.  His siblings are in good health.   SOCIAL HISTORY:  The patient has no history of alcohol or drug abuse.  He  lives with his wife and is receiving ongoing physical therapy.   REVIEW OF SYSTEMS:  HEENT:  Head:  He denies headache or dizziness.  Eyes:  He denies visual blurring or diplopia.  Ear, nose, and throat:  Denies ear  ache, sinus pain, or sore throat.  CHEST:  He feels that his breathing is  gradually improved.  He is not having a productive cough.  CARDIOVASCULAR:  There has been no orthopnea or PND.  He feels his respiratory status is  slightly improved.  GASTROINTESTINAL:  Denies  nausea, vomiting, abdominal  pain, change in bowel habits, melena, or hematochezia.  GENITOURINARY:  Denies dysuria, urinary frequency, hesitancy, or nocturia.  RHEUMATOLOGIC:  As previously noted, he is experiencing podagra in his left great toe.  HEMATOLOGIC:  There is no history of easy bleeding or bruising.  NEUROLOGIC:  There is no history of seizure or stroke.   PHYSICAL EXAMINATION:  VITAL SIGNS:  Heart rate 65, blood pressure 142/70,  respirations 12.  GENERAL:  This is a pleasant, cooperative man who appears to be in no acute  distress.  HEENT:  Within normal limits.  CHEST:  Diminished breath sounds at the bases.  CARDIOVASCULAR:  Crisp valve click.  There are no rubs or gallops.  No  murmurs.  ABDOMEN:  Within normal limits.  There are no masses, tenderness,  organomegaly.  NEUROLOGIC:  Within normal limits.  EXTREMITIES:  Remarkable for two findings.  The first is tenderness and  redness of the MTP joint of the great toe of the left foot.  The second  significant finding is an area of phlebitis with induration in the  antecubital fossa of the right arm.  There is no apparent fluctuance.   LABORATORIES:  As mentioned previously, his chest x-ray shows resolving  bibasilar atelectasis.  INR 1.9 on Coumadin therapy.  Creatinine 1.3.  Hemoglobin 12.  Electrolytes are within normal limits.  Glucose is 119.   IMPRESSION:  1. Acute brief syncopal episode, rule out significant arrhythmia.  2. Fever of uncertain etiology.  This could represent a fever secondary to     the phlebitis in his right arm.  It is also conceivable this is a     cardiomyotomy syndrome.  It is important to consider the possibility this     could be a valve infection, although he does not seem to be particularly     ill at this time.  3. Atelectasis on chest x-ray which appears to be resolving.  4. Gout involving the great toe of the left foot. 5. Status post aortic valve replacement with #25 St. Jude  mechanical aortic     valve.  6. Hypertension.  7. History of kidney stones.  8. History of postoperative atrial fibrillation.   PLAN:  The patient will be admitted to telemetry.  We will treat his gout  with colchicine and Celebrex.  I think it would be best to withhold  prednisone at this time given the concern about his fevers.  Will evaluate  his temperatures.  I think it is important to obtain serial blood cultures.  I am going to empirically put him on vancomycin and Tequin pending the  results of these cultures.  It would probably be a good idea for the  surgeons to  take a look at the phlebitis on his right arm just to be sure that incision  and drainage is not indicated.  Will also make sure he gets incentive  spirometry.  As previously mentioned, Peter M. [place], M.D. is the patient's  cardiologist and he will be notified of the patient's admission.  His usual  medications will be continued.                                                Sherin Quarry, MD    SY/MEDQ  D:  05/01/2003  T:  05/01/2003  Job:  [id number]   cc:   Clarene Duke, M.D.  [facility]   Peter M. [place], M.D.  [address]  [place], [place] [address]  Fax: [phone]   Gwenith Daily. Tyrone Sage, M.D.  [address]  [place]  [place] [address]

## 2010-12-09 NOTE — H&P (Signed)
NAME:  Adrian Lambert, Adrian Lambert                        ACCOUNT NO.:  [id number]   MEDICAL RECORD NO.:  [id number]                   PATIENT TYPE:  INP   LOCATION:  5705                                 FACILITY:  [facility]   PHYSICIAN:  Jackie Plum, M.D.             DATE OF BIRTH:  1940-11-03   DATE OF ADMISSION:  05/26/2003  DATE OF DISCHARGE:                                HISTORY & PHYSICAL   PROBLEM LIST:  1. Left lower quadrant abdominal pain secondary to diverticulitis.  2. History of aortic valve replacement on chronic Coumadin therapy.  3. Hypertension.  4. Gouty arthritis.  5. Kidney stones.  6. History of atrial fibrillation.  7. Obesity.   CHIEF COMPLAINT:  Abdominal pain for five days.   HISTORY OF PRESENT ILLNESS:  The patient is a 70 year old Caucasian male who  presents with left lower quadrant abdominal pain.  The pain started about  five days ago, is described as pain of moderate to severe intensity.  The  pain was nonradiating.  Said there was some mild nausea without any vomiting  as well as fever without any chills.  The patient went to his primary care  physician, Dr. Caryn Bee L. Little, who referred him for CT imaging and CT  reviewed the diverticulitis.  Thus, he was subsequently referred here for  further management of diverticulitis.   The patient was recently discharged from the hospital in October after a  syncopal episode.  The patient denies any chest pain, shortness of breath,  or significant cardiopulmonary symptomatology.  Denies any dizziness or any  visual changes.  No history of sore throat, sinus pain, or headache.  He  does not have any cough, paroxysm nocturnal dyspnea, or orthopnea.  He  denies any diarrhea or constipation, bright red blood per rectum,  hematochezia, or hemoptysis.  No history of frequency, micturition, or  dysuria.  He denies any atretic pain at this moment.   PAST MEDICAL HISTORY:  Please see problem list above.   MEDICATIONS:  1. Lopressor 25 mg p.o. b.i.d.  2. Coumadin 2.5 mg p.o. daily.  3. Amiodarone 50 mg b.i.d.  4. Tramadol.   FAMILY HISTORY:  Notable for congestive heart failure and diabetes in his  mother.   SOCIAL HISTORY:  He is married and lives with his wife and children.  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  Does not smoke cigarettes or  use illicit drugs.  He drinks a few glasses of wine every night.   REVIEW OF SYMPTOMS:  Significant positive/negatives are __________  unremarkable.   PHYSICAL EXAMINATION:  VITAL SIGNS:  Blood pressure 131/83, pulse 89,  respiratory rate 20, temperature 101.9.  GENERAL:  He was in moderate painful distress.  No acute cardiopulmonary  distress.  HEENT:  Normocephalic and atraumatic.  He was not pale and nonicteric.  Pupils are equal, round, and reactive to light.  Extraocular movements  intact.  Oropharynx was slightly dry; however, there was no pharyngeal  exudation or erythema.  TMs were unremarkable.  NECK:  Supple.  No JVD.  No thyromegaly.  LUNGS:  Clear to auscultation.  CARDIOVASCULAR:  Notable for absence of any gallops or murmurs.  I could  hear the valvular click.  ABDOMEN:  Obese.  He had tenderness in his left lower quadrant area with  some guarding.  No rib tenderness was appreciated.  Bowel sounds were  present.  They were normal active.  EXTREMITIES:  No edema.  No clubbing.  NEUROLOGICAL:  Alert and oriented x 3.  No deficits.   LABORATORY DATA:  CT of the abdomen reportedly showed diverticulitis in his  colon per telephone report to me.  Not available for my evaluation.   Diverticulitis in a 70 year old gentleman with cardiac history:  Plan of  care is to admit the patient to telemetry bed.  We will obtain complete  blood count and basic metabolic panel.  We will offer him supportive care  and give him intravenous fluid supplementation.  His INR will be checked to  make sure that his INR is therapeutic for  his aortic valve replacement.  We  will start antibiotics, i.e. Flagyl and Cipro.  We will continue him on  clear liquids at this time.  He will receive pain medication as needed as  needed.   I spent some time explaining to the patient and his wife as well as his son  at bed side today regarding the course of diverticular disease and  diverticulitis.  All their questions were answered.                                                Jackie Plum, M.D.    GO/MEDQ  D:  05/26/2003  T:  05/26/2003  Job:  [id number]   cc:   Corinna L. Lendell Caprice, MD  Fax: [phone]   Anna Genre. Little, M.D.  [address]  [place]  [place] [address]  Fax: [phone]

## 2010-12-09 NOTE — H&P (Signed)
NAME:  Adrian Lambert, Adrian Lambert                        ACCOUNT NO.:  [id number]   MEDICAL RECORD NO.:  [id number]                   PATIENT TYPE:  INP   LOCATION:  5732                                 FACILITY:  [facility]   PHYSICIAN:  Abigail Miyamoto, M.D.              DATE OF BIRTH:  1941-03-15   DATE OF ADMISSION:  10/04/2003  DATE OF DISCHARGE:  10/14/2003                                HISTORY & PHYSICAL   CHIEF COMPLAINT:  History of diverticulitis.   HISTORY OF PRESENT ILLNESS:  This is a 70 year old gentleman who presented  several months earlier with a diverticulitis and diverticular abscess  needing percutaneous drainage.  He improved and has recovered and now  presents for elective laparoscopic partial colectomy.  Given the fact that  he is on Coumadin for heart valve, the decision was made to admit him to the  hospital preoperatively for placement on heparin and following of his PT,  PTT, as well as bowel preparation.   PAST MEDICAL HISTORY:  Significant for valvular heart disease status post  valve replacement.  History of atrial fibrillation and history of  hypertension.   MEDICATIONS:  1. Lopressor.  2. Amoxicillin.  3. Coumadin.   SOCIAL HISTORY:  He does not smoke and does not drink alcohol.   REVIEW OF SYSTEMS:  Otherwise negative.   PHYSICAL EXAMINATION:  GENERAL:  A well-developed, well-nourished, gentleman  in no acute distress.  He is well in appearance.  HEENT:  Eyes anicteric.  Pupils are reactive bilaterally.  LUNGS:  Clear to auscultation bilaterally with normal respiratory effort.  HEART:  Regular rate and rhythm with the sounds of his heart valve.  There  are no crackles.  ABDOMEN:  Soft and nontender.  No organomegaly.  No hernias.  EXTREMITIES:  Warm and well perfused.   IMPRESSION:  This is a patient with a history of diverticulitis with abscess  who is on chronic anticoagulation for heart valve.   PLAN:  To admit him to the hospital, place him on  heparin, and plan elective  partial colectomy.                                                Abigail Miyamoto, M.D.    DB/MEDQ  D:  11/24/2003  T:  11/24/2003  Job:  [id number]

## 2010-12-09 NOTE — Consult Note (Signed)
NAME:  Adrian Lambert, Adrian Lambert                        ACCOUNT NO.:  [id number]   MEDICAL RECORD NO.:  [id number]                   PATIENT TYPE:  INP   LOCATION:  3730                                 FACILITY:  [facility]   PHYSICIAN:  Abigail Miyamoto, M.D.              DATE OF BIRTH:  11-11-1940   DATE OF CONSULTATION:  05/30/2003  DATE OF DISCHARGE:                                   CONSULTATION   REPORT TITLE:  SURGICAL CONSULTATION   CONSULTING PHYSICIAN:  Abigail Miyamoto, M.D.   REFERRING PHYSICIAN:  Deirdre Peer. Polite, M.D.   CHIEF COMPLAINT:  Diverticular abscess.   HISTORY OF PRESENT ILLNESS:  This is a 70 year old gentleman who was  admitted with sigmoid diverticulitis on May 26, 2003.  He had been  having left lower quadrant abdominal pain for approximately five days.  I  have been asked to see him by Dr. Nehemiah Settle secondary to CT scan findings of a  diverticular abscess.  The patient reports that prior to admission he had  been having abdominal pain for about five days, which was moderate to severe  in intensity.  He has had diverticulitis approximately two years ago.  He  had had some mild nausea, but no vomiting and had had some fever, but no  chills.  He reports that he has been living in [place].  He has had no  dysuria or hematuria.  He has had no chest pain or shortness of breath.   PAST MEDICAL HISTORY:  Significant for having had an aortic valve  replacement and is on chronic Coumadin therapy secondary to this.  He also  has a history of hypertension, gouty arthritis, kidney stones, a history of  atrial fibrillation, and obesity.   MEDICATIONS:  Lopressor, Coumadin, amiodarone, and tramadol.   PAST SURGICAL HISTORY:  Aortic valve replacement.   FAMILY HISTORY:  Notable for congestive heart failure and diabetes.   SOCIAL HISTORY:  He is married and lives with his wife and children.  He  does not smoke cigarettes.  He drinks a few glasses of wine.  The review  of  systems is otherwise unremarkable.   PHYSICAL EXAMINATION:  GENERAL:  A mildly obese gentleman in no acute  distress.  VITAL SIGNS:  He is afebrile.  His vital signs are stable.  HEENT:  Eyes:  He is anicteric.  Pupils react bilaterally.  Ears, Nose,  Mouth, and Throat:  External ears and nose are normal.  Hearing is normal.  The oropharynx is clear.  NECK:  Supple.  There is no cervical adenopathy or thyromegaly.  LUNGS:  Clear to auscultation bilaterally with normal respiratory effort.  CARDIOVASCULAR:  Regular rate and rhythm with the sound of a mechanical  valve.  There is no peripheral edema.  ABDOMEN:  Soft with moderate tenderness and guarding of the left lower  quadrant.  There is no organomegaly.  There are no hernias.  EXTREMITIES:  Warm and well perfused.   LABORATORY DATA:  White blood count 10.6, hemoglobin 11.3, and platelets  244.  Creatinine 1.0 and potassium 3.9.  PT 28.8 with INR 3.4.   CT scan of the abdomen and pelvis shows sigmoid diverticulitis with a 3 x  3.5 cm abscess.   ASSESSMENT/PLAN:  This is a pleasant 70 year old gentleman with sigmoid  diverticulitis and a diverticular abscess.  This is complicated by the fact  that he is on chronic anticoagulation from his heart valve standpoint.  I  have reviewed the CT scan with the radiologist and the abscess would be  amenable to aspiration __________.  In order to do this, he would need to  start heparin and stop his Coumadin in order to allow his INR to return to  normal.  I think this would allow him to resolve his process more quickly.  I have my doubts that the abscess would resolve on antibiotics alone.   The patient is requesting his cardiologist's opinion regarding any changes  in his anticoagulation.  As he is not acutely ill, we can proceed slowly.  If he does become acutely sicker, we would need to reverse his Coumadin with  fresh frozen plasma and vitamin K.   I will follow Mr. Pellegrino  closely.                                               Abigail Miyamoto, M.D.    DB/MEDQ  D:  05/30/2003  T:  05/30/2003  Job:  [id number]   cc:   Deirdre Peer. Polite, M.D.  [address]  [place], [place] [address]  Fax: [phone]

## 2010-12-09 NOTE — Discharge Summary (Signed)
NAME:  Adrian Lambert, Adrian Lambert                        ACCOUNT NO.:  [id number]   MEDICAL RECORD NO.:  [id number]                   PATIENT TYPE:  INP   LOCATION:  2024                                 FACILITY:  [facility]   PHYSICIAN:  Gwenith Daily. Tyrone Sage, M.D.            DATE OF BIRTH:  [birth year]   DATE OF ADMISSION:  04/13/2003  DATE OF DISCHARGE:  04/23/2003                                 DISCHARGE SUMMARY   PRIMARY ADMITTING DIAGNOSIS:  Shortness of breath.   ADDITIONAL DISCHARGE DIAGNOSES:  1. Critical aortic stenosis.  2. Hypertension.  3. History of kidney stones.  4. Postoperative atrial fibrillation and atrial flutter.   PROCEDURE PERFORMED:  1. Aortic valve replacement with #25 St. Jude mechanical aortic valve.  2. Cardiac catheterization.   HISTORY:  The patient is a 70 year old white male who, over the past several  months, has noticed that he gets short of breath when climbing steps or  walking for an extended period of time.  On exam, he is noted to have a  murmur.  He underwent an echocardiogram in January 2004, which showed normal  left ventricular hypertrophy with a thickened aortic valve.  He has been  followed medically since that time, but finds that his symptoms are getting  worse and now he is getting light-headed in associated with his other  symptoms.  A repeat echo was performed on February 04, 2003, which showed a  significant increase in his aortic valve gradient to a peak gradient now of  69 mmHg, a mean gradient of 40 mmHg, and an aortic valve area of 0.52 cm  squared.  Because of these findings and his worsening symptoms, it was  recommended that he undergo cardiac catheterization for further evaluation  of his valve as well as coronaries in preparation for possible aortic valve  replacement.   HOSPITAL COURSE:  He was admitted on September 20, and underwent cardiac  catheterization by Dr. Swaziland.  He was found to have severe aortic stenosis,  as well as  normal coronaries.  He was seen in consultation by Dr. Tyrone Sage  and it was felt that he should undergo aortic valve replacement at this  time.  After discussion of the risks, benefits, and alternatives to the  procedure, he consented and was taken to the operating room on 04/14/03.  He  underwent aortic valve replacement with a #25 St. Jude mechanical valve.  He  tolerated the procedure well and was transferred to the SICU in stable  condition.  He was extubated shortly after surgery.  He was hemodynamically  stable and doing well on postoperative day #1.  He was maintained initially  on Neo-Synephrine drip.  This was weaned over the course of postoperative  day #1 and was discontinued.  By postoperative day #2, he was stable and was  transferred to the floor.  At that time, his anticoagulation was initiated.  On postoperative day #5,  he developed atrial fibrillation with rates  initially controlled, but converting over into atrial flutter with rates in  the 140s.  His INR was subtherapeutic at that time, at 1.5.  He was started  on an amiodarone drip as well as Lopressor.  He was then converted to p.o.  amiodarone.  He initially converted to normal sinus rhythm, but over the  course of the next 24 hours, went back into atrial fibrillation with rates  in the 130s.  He was followed closely by Dr. Swaziland throughout, and his  doses of amiodarone and Lopressor were titrated appropriately.  During this  time, he was also diuresed for postoperative volume overload.  By  postoperative day #9, his INR was therapeutic at 2.4.  He was still in  atrial flutter, but his rates were controlled in the 80s and had been so for  48 hours.  He had been diuresed adequately.  He had remained afebrile and  his vital signs were stable.  His surgical incision sites were healing well.  He was ambulating in the halls with cardiac rehabilitation, phase I, as well  as independently.  His pacing wires had been removed.   He had been arranged  some supplemental oxygen and was maintaining O2 saturations of greater than  90% on room air.  His other laboratories had remained stable with hemoglobin  of 9.5, hematocrit of 29.2, platelets 145, white blood count 12.6.  Blood  chemistries were also stable with a BUN of 16, creatinine 1.0, and potassium  4.0.  It was felt that if he had remained rate controlled on amiodarone and  Lopressor and his anticoagulation was therapeutic, that he could be  discharged home with close outpatient followup.   DISCHARGE MEDICATIONS:  1. Coumadin 2.5 mg daily with blood work to be checked by Dr. Elvis Coil     office in two days.  2. Amiodarone 400 mg b.i.d.  3. Lopressor 25 mg b.i.d.  4. Ultram 50-100 mg q.4h. p.r.n. pain.   DISCHARGE INSTRUCTIONS:  He is to refrain from driving, heavy lifting, or  strenuous activity.  He will continue daily ambulation and use of his  incentive spirometer.  He may continue a low-fat, low-sodium diet.  He will  shower daily and clean his incisions with soap and water.   DISCHARGE FOLLOWUP:  He is asked to make an appointment to see Dr. Swaziland in  two weeks and have a chest x-ray at that visit.  He will follow up with Dr.  Elvis Coil office for Coumadin management, as previously mentioned.  The [facility]  office will contact him with an appointment to see Dr. Tyrone Sage in  approximately three weeks.  He is asked to bring his chest x-ray to this  visit for Dr. Dennie Maizes review.  He will call our office in the interim if  he experiences any problems or has any questions.      Coral Ceo, P.A.                        Gwenith Daily Tyrone Sage, M.D.    GC/MEDQ  D:  04/29/2003  T:  04/30/2003  Job:  [id number]   cc:   Peter M. Swaziland, M.D.  [address]  [place], [place] [address]  Fax: [phone]   Anna Genre. Little, M.D.  [address]  [place]  [place] [address]  Fax: [phone]

## 2010-12-09 NOTE — Assessment & Plan Note (Signed)
[facility]                             PULMONARY OFFICE NOTE   DIVON, KRABILL                       MRN:          [id number]  DATE:07/04/2006                            DOB:          11-26-1940    HISTORY:  A very nice 70 year old white male never smoker with  refractory cough associated with dyspnea and hoarseness status post  aortic valve replacement in October 2004.  Dr. Retta Diones asked me  actually to see him for pulmonary fibrosis on abdominal scan but I did  not think that had anything to do with his symptoms and recommended he  return here today for PFTs and eliminate ACE inhibitors empirically and  replace them with Exforge 5/160 one daily.  He says the cough and  breathing are only a little bit better.  The cough tends to be worse  as the day goes on associated with marked hoarseness but no significant  sputum production, orthopnea, PND, fevers, chills, sweats or leg  swelling.   For full inventory of medication please see face sheet, dated July 04, 2006.   PHYSICAL EXAMINATION:  GENERAL:  This is a robust, ambulatory, obese  white male in no acute distress weighing 239, which is up another 7  pounds from previous visit.  Blood pressure 112/64.  HEENT:  Unremarkable, pharynx clear except for minimum erythema.  No  cobblestoning or excess postnasal drainage.  Nasal turbinates are also  normal.  NECK:  Supple without cervical adenopathy or tenderness.  Trachea is  midline, no thyromegaly.  LUNG FIELDS:  Completely clear bilaterally to auscultation and  percussion with no cough elicited on inspiratory or expiratory  maneuvers.  HEART:  Regular rate and rhythm with a 1/6 systolic murmur and slightly  mechanical-sounding S2.  ABDOMEN:  Obese but benign.  EXTREMITIES:  Warm without calf tenderness, cyanosis, clubbing, or  edema.   Chest x-ray was reviewed today and reveals minimal scarring at the  bases.  PFTs are essentially  within normal limits except for a diffusion  capacity of 73% not corrected for hemoglobin.   IMPRESSION:  1. There is no evidence that the pulmonary fibrosis is contributing to      either his dyspnea or his cough.  Pulmonary fibrosis when it causes      cough usually is associated with crackles on inspiration and cough      on inspiratory maneuvers, which the patient lacks.  2. Also, the patient had a previous history of ACE inhibitor      intolerance which suggests an upper airway source of coughing but      probably not ACE-inhibitor induced but rather exacerbated.      Specifically, given his abdominal girth most likely he has reflux      and ACE inhibitor was simply aggravating a reflux cough.  To test      this hypothesis I recommend maintaining him off of ACE inhibitors,      on Exforge 5/160 samples for another 4 weeks, and I gave him      samples of Protonix 40 mg to take  30 minutes to 60 minutes before      breakfast each and every day, and then a quick cough suppression      trial with tramadol and prednisone for 6 days.   I reviewed all these issues with him in writing.  Also, a diet was  reviewed with the patient aimed at reducing the likelihood of reflux,  and I also explained the cyclical mechanism of coughing inducing reflux  including coughing to him in detail.   Followup will be in 4 weeks.   In terms of the fibrotic changes, which I believe are real, it is  difficult to know whether these represent previous acute lung injury or  an early stage of pulmonary fibrosis.  It is critical that the patient  understand conditioning and maintain a level of conditioning where he  can tell us if he begins to notice a decrease in exercise tolerance.  Followup of the pulmonary fibrosis should probably occur in 6 months in  the context of an office visit with chest x-ray and PFTs.  Will arrange  for this after the present cough, dyspnea and hoarseness are addressed  to the  patient's satisfaction.     Charlaine Dalton. Sherene Sires, MD, [facility]  Electronically Signed    MBW/MedQ  DD: 07/04/2006  DT: 07/04/2006  Job #: [id number]   cc:   Caryn Bee L. Little, M.D.  Peter M. Swaziland, M.D.  Bertram Millard. Dahlstedt, M.D.

## 2010-12-09 NOTE — Assessment & Plan Note (Signed)
[facility]                             PULMONARY OFFICE NOTE   NAME:[name], [name]                     MRN:          [id number]  DATE:10/24/2006                            DOB:          1940/12/02    HISTORY:  A 70 year old white male who comes back all smiles having  stopped ACE inhibitors in November 2007, but having difficulty with  eradication of cough until we treated him with combination of Aciphex  during the day and Pepcid at bedtime, along with heavy suppression of  cyclical cough with Delsym and Tramadol.  He has been cough-free for the  last several weeks and on no cough suppressants or acid reflux  medications.   PHYSICAL EXAMINATION:  He is a pleasant, ambulatory, moderately obese  white male, in no acute distress.  VITAL SIGNS:  Stable with Blood pressure 134/74 after taking his Exforge  5/160 this morning.  HEENT:  Unremarkable, oropharynx clear.  LUNGS:  Fields completely clear bilaterally to auscultation and  percussion.  Regular rhythm without murmur, gallop, rub.  ABDOMEN:  Soft, benign.  EXTREMITIES:  Warm without calf tenderness, cyanosis, clubbing, edema.   Sinus CT scan reviewed from July 29, 2006 shows mild thickening but no  air fluid levels.   Chest x-ray dated August 04, 2005 is normal.   IMPRESSION:  Classic cyclical cough that probably began as a viral upper  respiratory infection and then developed secondary reflux.  I explained  the barrier disruption that occurs with viral upper respiratory  infections to help the patient understand that when he starts coughing  for any reason and has secondary reflux, the barrier disruption will  lead to a much more sensitive upper airway in terms of perpetuating the  cough.  Therefore it is important to not only suppress the cough, but  also eliminate acid.  If not chronically, at least acutely with over the  counter Prilosec and Pepcid.   Pulmonary followup can  therefore be p.r.n.   In terms of his blood pressure management he is doing great on the  Exforge 5/160 but it may not be on his drug formulary.  I gave him a  month's worth of free samples to see Dr. Clarene Duke in the meantime for  alternative therapy if needed for financial reasons but I would avoid  ACE inhibitors in this individual, who now twice has demonstrated the  same pattern of cough while on ACE inhibitors.    Adrian Lambert. Adrian Sires, MD, [facility]  Electronically Signed   MBW/MedQ  DD: 10/24/2006  DT: 10/24/2006  Job #: [id number]   cc:   Adrian Lambert, M.D.

## 2010-12-09 NOTE — Op Note (Signed)
NAME:  Adrian Lambert, Adrian Lambert                        ACCOUNT NO.:  [id number]   MEDICAL RECORD NO.:  [id number]                   PATIENT TYPE:  INP   LOCATION:  5727                                 FACILITY:  [facility]   PHYSICIAN:  Petra Kuba, M.D.                 DATE OF BIRTH:  19-Jul-1941   DATE OF PROCEDURE:  10/05/2003  DATE OF DISCHARGE:                                 OPERATIVE REPORT   PROCEDURE:  Colonoscopy.   INDICATIONS:  Abnormal CT scan preop.  Consent was signed after risks,  benefits, methods, and options thoroughly discussed in the office.   MEDICINES USED:  Demerol 50 mg, Versed 6 mg.   DESCRIPTION OF PROCEDURE:  Rectal inspection was pertinent for external  hemorrhoids, small.  Digital exam was negative.  The video pediatric  colonoscope was inserted, fairly easily advanced around the colon to the  cecum.  On insertion, significant left-sided diverticula were seen.  To  advance to the cecum required some abdominal pressure but no position  changes.  No other obvious abnormality was seen on insertion.  The cecum was  identified by the appendiceal orifice and the ileocecal valve.  The scope  was inserted a short way into the terminal ileum, which was normal.  Photo  documentation was obtained.  Prep was adequate.  There was some liquid stool  that required washing and suctioning.  On slow withdrawal through the colon  the cecum was normal.  In the ascending colon a few diverticula were seen.  The scope was slowly withdrawn with another scattered diverticula around the  splenic flexure.  In the mid-transverse a questionable small polyp was seen  and cold biopsied x2.  The scope was then further withdrawn.  The most  significant diverticula were in the more proximal sigmoid-distal descending  from about 45-35 cm.  No other abnormalities were seen.  The distal sigmoid  was normal without signs of diverticula.  Anorectal pull-through and  retroflexion back in the rectum  confirmed some small hemorrhoids.  The scope  was inserted a short way up the left side of the colon, air was suctioned,  and the scope removed.  The patient tolerated the procedure well.  There was  no obvious immediate complication.   ENDOSCOPIC DIAGNOSES:  1. Internal-external hemorrhoids.  2. At 35-45 cm significant diverticula, mostly proximal sigmoid-distal     descending.  3. Occasional splenic flexure and right-sided colon diverticula.  4. Questionable small mid-transverse polyp cold biopsied.  5. Otherwise within normal limits to the terminal ileum.   PLAN:  Per surgical options, okay to resume clear liquids and heparin.  Recheck colon pending pathology.  Be on standby to help p.r.n.  Petra Kuba, M.D.    MEM/MEDQ  D:  10/05/2003  T:  10/05/2003  Job:  [id number]   cc:   Abigail Miyamoto, M.D.  [address]  [place]  [place] [address]  Fax: [phone]   Anna Genre. Little, M.D.  [address]  [place]  [place] [address]  Fax: [phone]

## 2010-12-09 NOTE — Assessment & Plan Note (Signed)
[facility]                             PULMONARY OFFICE NOTE   NAME:Adrian Lambert, Bearce                     MRN:          [id number]  DATE:07/12/2006                            DOB:          06/17/1941    HISTORY OF PRESENT ILLNESS:  The patient is a 70 year old white male  patient of Dr. Sherene Sires, who has a known history of refractory cough,  associated with dyspnea, hoarseness, status post aortic valve  replacement in 2004 and underlying pulmonary fibrosis.  Last visit, the  patient was changed off his ACE inhibitor, given Protonix to be taken  every day and a prednisone taper, along with aggressive cough prevention  with Tramadol.  The patient reports his cough has improved.  However,  over the last two days, he has developed very loose stools, greater than  five stools, being very watery.  He has associated decreased appetite  and lower abdominal cramping with stools.  He denies any fever, bloody  stools, recent antibiotic or travel, chest pain or shortness of breath.   PAST MEDICAL HISTORY:  Reviewed.   CURRENT MEDICATIONS:  Reviewed.   PHYSICAL EXAMINATION:  The patient is a pleasant male, in no acute  distress.  He is afebrile with stable vital signs.  His O2 saturation is  96% on room air.  HEENT:  Sclerae are anicteric.  Posterior pharynx is clear.  Oral mucosa  is pink and moist.  NECK:  The neck is supple, without adenopathy.  LUNGS:  Lung sounds reveal coarse breath sounds without any wheezing  crackles.  CARDIAC:  Regular rate.  ABDOMEN:  Soft with positive bowel sounds throughout all four quadrants.  Nontender.  No guarding or rebound noted.  Negative CVA tenderness.  EXTREMITIES:  Warm without any calf cyanosis, clubbing or edema.   IMPRESSION AND PLAN:  1. Refractory cough with suspected upper airway instability, secondary      to ACE inhibitor.  The patient will continue on his current cough      suppression regimen and off the ACE  inhibitor.  Followup back with      Dr. Sherene Sires in two weeks as scheduled or sooner, if needed.  2. New onset diarrhea.  Suspect this is viral in nature.  However,      will currently hold Protonix until symptoms have totally resolved      and then we will discuss at next office visit whether we need to      restart this.  To note also, the patient is on Colchicine, which I      have advised him to hold, as well, because of the potential for      associated diarrhea.  The patient will hold his fiber in his diet,      advancing clear liquids in his diet, and may use Levsin 0.125 mg      tablets as needed every 8 hours for abdominal cramping.  The      patient also may use Imodium AD      times one dose to see if that helps decrease his stools.  If this      does not improve, he will need to follow back up with his primary      care physician.      Rubye Oaks, NP  Electronically Signed      Charlaine Dalton. Sherene Sires, MD, [facility]  Electronically Signed   TP/MedQ  DD: 07/12/2006  DT: 07/13/2006  Job #: [id number]

## 2010-12-09 NOTE — Discharge Summary (Signed)
NAME:  Adrian Lambert, Adrian Lambert                        ACCOUNT NO.:  [id number]   MEDICAL RECORD NO.:  [id number]                   PATIENT TYPE:  INP   LOCATION:  5732                                 FACILITY:  [facility]   PHYSICIAN:  Abigail Miyamoto, M.D.              DATE OF BIRTH:  [birth year]   DATE OF ADMISSION:  10/04/2003  DATE OF DISCHARGE:  10/14/2003                                 DISCHARGE SUMMARY   DISCHARGE DIAGNOSES:  1. History of sigmoid diverticulitis with abscess, status post laparoscopic-     assisted sigmoid colectomy.  2. Gout.  3. Postoperative ileus.  4. Need for long-term anticoagulation.   SUMMARY OF HISTORY:  Adrian Lambert is a 70 year old gentleman who is on  Coumadin for replaced heart valve, who has had a history of diverticulitis  with abscess and now presents for elective laparoscopic-assisted partial  colectomy.   HOSPITAL COURSE:  The patient was admitted on October 04, 2003 and placed on  intravenous heparin.  His Coumadin was stopped at this time.  On October 05, 2003, his INR was 1.3 and he was continued on his heparin.  On October 05, 2003 also, bowel preparation was begun.  On October 06, 2003, he was taken to  the operating room where he underwent a laparoscopic-assisted left partial  colectomy.  He tolerated the procedure well and was taken back in stable  condition to a regular surgical floor.  His heparin was started later that  evening.  On postoperative day 1, he was continued on his heparin and  Coumadin was initiated.  His abdomen at this point remained soft.  On  postoperative day 2, he did have a temperature of 102 and this was felt to  be secondary to his atelectasis.  He was started on lipids at this time and  his heparin was stopped as his INR had increased to 1.9.  His Foley catheter  was also removed at this time.  He began complaining of some gout and was  started on colchicine.  On postoperative day 3, he was having flatus.  He  was still  febrile but his white blood count at this time was 11 and INR was  1.9.  He was continued on his colchicine and pulmonary toilet was continued  as well.  Over the next several days, he continued to slowly improve except  for his gout; he was tried on Motrin and Toradol and this seemed to help.  He was also continued on IV antibiotics at this time.  By postoperative day  5, he was passing flatus and having bowel movements and incision was healing  well.  His white blood count returned to normal on postoperative day 6 to  8.8.  We entertained doing a CAT scan secondary to his fevers, but he  quickly defervesced, so the CAT scan was held.  At this point, his PCA was  stopped and he  began ambulating better.  By October 13, 2003, he was  continuing to do quite well, his ileus had resolved and he was also  afebrile.  By October 14, 2003, he remained afebrile, he was having bowel  movements, his INR was 2.0 and the decision was made to discharge the  patient to home.  His skin staples were removed prior to discharge.   DISCHARGE DIET:  He may take a regular diet.   DISCHARGE ACTIVITY:  He is to no heavy lifting greater than 20 pounds for 6  weeks.   DISCHARGE MEDICATIONS:  He is to resume his home medications.  He will take  Percocet and Advil for pain.   WOUND CARE:  He may shower.   DISCHARGE FOLLOWUP:  He will follow up in my office next Tuesday, post  discharge.                                                Abigail Miyamoto, M.D.    DB/MEDQ  D:  11/24/2003  T:  11/25/2003  Job:  [id number]

## 2010-12-09 NOTE — Consult Note (Signed)
NAME:  Adrian Lambert, Adrian Lambert              ACCOUNT NO.:  [id number]   MEDICAL RECORD NO.:  [id number]          PATIENT TYPE:  INP   LOCATION:  5732                         FACILITY:  [facility]   PHYSICIAN:  Shirley Friar, MDDATE OF BIRTH:  09/07/1940   DATE OF CONSULTATION:  DATE OF DISCHARGE:                                 CONSULTATION   No dictation for this job.      Shirley Friar, MD     VCS/MEDQ  D:  07/20/2006  T:  07/21/2006  Job:  [id number]

## 2010-12-09 NOTE — Assessment & Plan Note (Signed)
[facility]                             PULMONARY OFFICE NOTE   NAME:Adrian Lambert, Adrian Lambert                     MRN:          [id number]  DATE:08/27/2006                            DOB:          [birth year]    PULMONARY/EXTENDED FOLLOWUP OFFICE VISIT:   HISTORY:  This is a 70 year old white male who states his cough is  better, but still present, having stopped ACE inhibitors in November  2007.  He denies any significant dyspnea, fevers, chills, sweats,  orthopnea, PND or leg swelling.   MEDICINES:  Reviewed in the column dated August 27, 2006.  Note that he  was not able to tolerate Protonix because of diarrhea and has been  taking Pepcid consistently b.i.d.  A course of prednisone made no  difference at all in terms of the cough.   PHYSICAL EXAMINATION:  He is a pleasant, ambulatory white male who  clears his throat frequently during the exam.  HEENT:  Unremarkable.  Oropharynx clear.  LUNGS:  Fields are completely clear bilaterally to auscultation and  percussion.  HEART:  Regular rhythm without murmur, gallop or rub.  ABDOMEN:  Soft and benign.  EXTREMITIES:  Warm without calf tenderness, cyanosis, clubbing or edema.   CT scan of the sinuses was reviewed from August 08, 2006 and is normal,  except for minimal sinus thickening.   Chest x-ray from July 04, 2006 was also reviewed and is normal.   IMPRESSION:  Classical chronic cyclical cough that started on ACE  inhibitors, almost completely resolved off of ACE inhibitors, then  exacerbated again.  Most likely the ACE inhibitors are not the cause of  the cough, but rather amplified a cough that was present because of  silent reflux.  I therefore recommended the following:   1. Rechallenge him with AcipHex dose, 20-mg tablets, 30-60 minutes      before breakfast daily and change Pepcid to 20 mg nightly only.  2. For cough, use Delsym two teaspoons b.i.d. and if still coughing,      add  tramadol 50 mg one every 4 hours.  Use sugar-free Jolly      Ranchers or ice or water instead of cough drops to suppress the      urge to cough.   I spent extra time, 15-20 to 25 minutes, going over a cyclical cough  regimen with the patient in detail to get his enthusiasm.  I would like  to see him back at the end of 4 weeks.     Charlaine Dalton. Sherene Sires, MD, [facility]  Electronically Signed    MBW/MedQ  DD: 08/27/2006  DT: 08/28/2006  Job #: [id number]   cc:   Caryn Bee L. Little, M.D.

## 2010-12-09 NOTE — Discharge Summary (Signed)
NAME:  Adrian Lambert, Adrian Lambert                        ACCOUNT NO.:  [id number]   MEDICAL RECORD NO.:  [id number]                   PATIENT TYPE:  INP   LOCATION:  3730                                 FACILITY:  [facility]   PHYSICIAN:  Melissa L. Ladona Ridgel, MD               DATE OF BIRTH:  [birth year]   DATE OF ADMISSION:  05/26/2003  DATE OF DISCHARGE:  06/05/2003                                 DISCHARGE SUMMARY   ADMITTING DIAGNOSES:  1. Left lower quadrant abdominal pain.  2. Diverticulitis.  3. Aortic valve replacement.  4. Hypertension.  5. Gouty arthritis.  6. Past medical history of atrial fibrillation.   DISCHARGE DIAGNOSES:  1. Diverticulitis.  2. Intra-abdominal abscess secondary to diverticular rupture.  3. Aortic valve replacement.  4. Hypertension.  5. History of atrial fibrillation.   HISTORY OF PRESENT ILLNESS:  The patient is a 70 year old Caucasian male who  presented to the emergency room on May 26, 2003, with new-onset left  lower quadrant pain.  He states that 5 days prior to admission that he began  with some mild nausea and vomiting as well as some diarrhea.  The pain was  non radiating.  He did not report any fever or chills.  He presented to his  primary care physician who referred him for abdominal CT, which showed  diverticulitis.  He was admitted to the hospital for further management of  the increasing abdominal discomfort.  On admission, he was started on IV  rehydration, Flagyl, as well as Cipro.  He was restricted to a clear-liquid  diet because of increased abdominal discomfort.  On day 2 and 3 of  admission, the patient was noted to have still persistent tolerance for p.o.  intake, was mildly febrile, with a slight elevation in his white blood cell  count.  He remained on IV Antibiotics and IV fluid, and on November 5 a  repeat CAT scan was undertaken to determine whether there was any change in  his abdominal findings.  CT scan of the abdomen revealed a  6-mm low-density  lesion within the left hepatic lobe, too small to characterize.  Some  possible polyps or stones in the posterior wall of the gallbladder and  diffuse diverticulosis of the colon.  Of note, they also determined that  there was an acute inflammation in the left lower quadrant along the sigmoid  colon focally with adjacent inflamed sigmoid colon.  There was extra-luminal  air and fluid collection consistent with a diverticular abscess measuring 3  x 3.3 cm.  There was no associated obstruction or ascites.  General surgery  was consulted and the CT was reviewed with radiology and surgery, which  determined that the collection would be easily aspirated.  Complicating  factor for this gentleman was his need for anticoagulation secondary to  recent aortic valve replacement.  Determined that his Coumadin would be  removed and he would be permitted  to slowly trend down before attempting CT-  guided drainage of the abscess.  By November 9, the patient's INR remained  elevated despite removal of his Coumadin and it was determined that a small  dose of FFP would be utilized to try to correct his INR for CT-guided  drainage.  On November 10, the patient was determined to be safe for CT-  guided drainage despite slight elevation in his INR.  It was undertaken  under the concomitant therapy with FFP infusion and was completed without  complication.  Postprocedure, the patient had no complaints.  He tolerated  the procedure well.  He continued on Cipro and Flagyl while awaiting  cultures to return and no bridging heparin was deemed necessary by  Cardiology and thus his Coumadin was restarted without any heparin coverage.  From a cardiovascular standpoint, the patient remained stable during the  course of the hospitalization.  He was in normal sinus rhythm and continued  on his amiodarone without problem.  By November 11, the wound culture began  to show signs of gram-positive cocci in  chains and pairs.  Further  identification of the organism was awaited before changing his antibiotics.  His Coumadin was restarted and the patient was permitted to eat to ascertain  whether he would tolerate p.o. diet.  He stated his abdomen was less  painful.  He was afebrile and his white count was 10.9.  He did experience  several bouts of diarrhea which tested C. difficile-negative.  By November  12, the patient was tolerating a normal diet without difficulty.  His  diarrhea had discontinued.  His temperature was 98.5.  His blood pressure  was 132/94.  Pulse was 65.  Respiration rate was 20.  His general exam was  in no acute distress.  Pupils were equal, round, and reactive to light.  Extraocular muscles were intact.  His mucous membranes were moist.  Neck was  supple.  His chest was clear to auscultation.  There were no rhonchi, rales,  or wheezes.  Cardiovascular exam was regular rate and rhythm.  Positive S1  and S2 with obvious click.  Abdomen was soft, nontender, nondistended.  His  postprocedure course, however, was complicated by an acute rash related to  his last dose of FFP.  This resolved with Benadryl and some topical therapy.  At the time of discharge, there was no evidence for rash on his abdomen.  His extremities showed no edema and no new rashes.   His laboratories returned with finding of abundant Enterococcus growing from  the abscess fluid that was aspirated.  He was discharged to home on p.o.  Augmentin x14 days as the drainage itself was deemed therapeutic.   At the time of discharge, the patient's medications were as follows:  1. Augmentin 500 and 125 p.o. b.i.d.  2. Coumadin 5 mg p.o. nightly x2 days.  The patient was then requested to     contact his primary care physician's office after having his INR checked     to determine the appropriate dose for his Coumadin.  3. Amiodarone 15 mg p.o. b.i.d.  4. Continue Lopressor 25 mg p.o. b.i.d.  He was instructed  on the appropriate diet for his diverticular disease in  terms of irradiating nut seeds and was to continue on bland food until he  was feeling much more improved.  He was scheduled to have his Coumadin level  drawn on November 15 by his primary care physician's office and was to  follow up with Dr. Clarene Duke on Thursday, November 18 at 10:30.  He also had an  appointment to be scheduled with Dr. Rayburn Ma in 3-4 weeks and was given the  phone number to schedule that appointment.   In summary, this is a 70 year old white male status post CT-guided drainage  of a diverticular abscess.  1. GI:  Resolved abdominal pain status post CT-guided drainage of     diverticular abscess, treated with Cipro, Flagyl, and continue therapy     with Augmentin at home.  2. St. Jude valve, currently stable.  His INR at discharge is 1.9.  He was     to continue his Coumadin and have followup on Monday with his primary     care physician.  3. Atrial fibrillation.  The patient remained in normal sinus rhythm on his     amiodarone and showed no problems during the course of his     hospitalization.  The patient was discharged to home in stable condition     with followup with Dr. Clarene Duke and Dr. Rayburn Ma.                                                Melissa L. Ladona Ridgel, MD    MLT/MEDQ  D:  06/14/2003  T:  06/14/2003  Job:  [id number]

## 2010-12-09 NOTE — Assessment & Plan Note (Signed)
[facility]                             PULMONARY OFFICE NOTE   BASCOM, BIEL                     MRN:          [id number]  DATE:08/07/2006                            DOB:          [birth year]    HISTORY:  This is a 69 year old white male with a cough dating back all  the way to 2004. It was much better after she stopped ACE inhibitors but  never 100% improved and when I saw the patient on July 04, 2006 for  the first time I noted profound hoarseness with a dry cough and more of  a daytime than a nighttime pattern consistent with reflux. I had found  that the combination of ACE inhibitors and reflux appears to be  synergistic for destabilizing the upper airway and recommended initially  that he be maintained of ACE inhibitors, and rechallenged with high  doses of PPI therapy which apparently he could not tolerate due to  diarrhea.   He returns today about 50% better overall off of ACE inhibitors but  has not been able to maintain PPI therapy because of diarrhea.   The patient does state that the best medicine he has ever found to  suppress this cough is Mucinex DM and takes maybe 1 a day but no more.   PHYSICAL EXAMINATION:  GENERAL:  He is a somber but not overtly  depressed appearing ambulatory man in no acute distress.  VITAL SIGNS:  He had stable vital signs.  HEENT:  Unremarkable. Nasal turbinate is normal. Oropharynx is clear  with no evidence of excessive post nasal drainage or cobblestoning. Ear  canals clear bilaterally.  NECK:  Supple without cervical adenopathy or tenderness. The trachea is  midline, no thyromegaly.  LUNGS:  Lung fields reveal trace crackles in the bases but no  significant cough on inspiratory maneuver.  HEART:  There is a regular rate and rhythm without murmur, gallop or  rub.  ABDOMEN:  Soft and benign.  EXTREMITIES:  Warm without calf tenderness, cyanosis, clubbing or edema.   Chest x-ray reviewed  from July 04, 2006 essentially normal.   IMPRESSION:  Chronic upper airway cough probably represents a cyclical  pattern with reflux either playing a primary or secondary role. I  recommended that we rechallenge him with acid reflux treatment but  acknowledge that he cannot take high doses of proton pump inhibitors. I  thought we would try starting him on Pepcid 20 mg b.i.d. along with a 6  day course of prednisone and continuing him on Mucinex DM which he  ranked as the most effective at cough suppression but increased it to 2  tablets b.i.d.   I also recommended proceeding to a sinus CT scan at this point and  considering a methacholine challenge test if this is negative and he  fails to respond to the above measures.   I reviewed his entire clinical course with him in detail, spent an extra  15-20 minutes of a 25 minute visit emphasizing the importance of  understanding cyclical coughing and the fact that the  cough at this point may  actually be perpetuating itself so he needs to  push the Mucinex DM more vigorously and we could certainly supplement  that with Tramadol if needed.     Charlaine Dalton. Sherene Sires, MD, [facility]  Electronically Signed    MBW/MedQ  DD: 08/07/2006  DT: 08/08/2006  Job #: [id number]   cc:   Caryn Bee L. Little, M.D.

## 2010-12-09 NOTE — Discharge Summary (Signed)
NAME:  Adrian Lambert, Adrian Lambert                        ACCOUNT NO.:  [id number]   MEDICAL RECORD NO.:  [id number]                   PATIENT TYPE:  INP   LOCATION:  2017                                 FACILITY:  [facility]   PHYSICIAN:  Melissa L. Ladona Ridgel, MD               DATE OF BIRTH:  [birth year]   DATE OF ADMISSION:  05/01/2003  DATE OF DISCHARGE:  05/04/2003                                 DISCHARGE SUMMARY   PHYSICIANS:  1. Anna Genre Little, M.D., primary care physician.  2. Peter M. [place], M.D., cardiologist.  3. Gwenith Daily. Tyrone Sage, M.D., cardiovascular surgeon.   DISCHARGE DIAGNOSES:  1. Syncope, thought to be secondary to amiodarone dosing.  2. Atelectasis, resolved.  3. Gout, resolved.  Status post aortic valve replacement on chronic Coumadin     therapy.  4. Hypertension, stable.  5. History of kidney stones.  6. History of atrial fibrillation.  7. Phlebitis, resolved.   DISCHARGE MEDICATIONS:  1. Amiodarone 200 mg p.o. b.i.d.  2. Celebrex 200 mg p.o. b.i.d.  3. Colchicine 0.6 mg p.o. b.i.d. decreasing to daily.  4. Lopressor 25 mg p.o. b.i.d.  5. Ultram 50 mg q.6h. p.r.n.  6. Ambien 5 mg p.o. q.h.s.  7. Coumadin 5 mg q.p.m.  8. Cortisone 0.5% to rash t.i.d.   ALLERGIES:  No known drug allergies.   PROCEDURE:  None.   HISTORY OF PRESENT ILLNESS:  A 70 year old white male who was discharged  from the hospital seven days ago status post AVR.  Postoperatively, he  developed atrial fibrillation.  He has now returned to normal sinus rhythm  and he has been progressing relatively well but has had several new problems  recently including dizziness with syncope, phlebitis of the right arm,  resolving atelectasis, and gout.  He is admitted for further treatment.   HOSPITAL COURSE:  The patient is admitted to telemetry unit.  Gout was  treated with colchicine and Celebrex.  His phlebitis is treated with  antibiotic therapy with good result.   The patient was evaluated by the  cardiology team.  Dr. Peter M. [place] found  him to have no new arrhythmias or any worsening cardiac signs; however, the  patient was on amiodarone 400 mg b.i.d.  Dr. Peter M. [place] decreased this  to 200 mg b.i.d.  The patient responded well and had no further syncope or  dizziness.   The patient was maintained on his Coumadin for his AVR and his INR remained  therapeutic throughout his stay.  The patient was evaluated by CVTS surgery  during this hospitalization for monitoring only.  They had no other  recommendations.   The patient did have some diarrhea while in the hospital.  He was tested for  C. Difficile and found to be negative.  Blood cultures were also performed  and found to be negative in five days.  The patient's phlebitis was treated  with Avelox  with good resolution.  Also, there was an associated rash with  that phlebitis which was treated with topical cortisone with good results.  The patient is instructed to follow up with his primary care physician  regarding his rash and phlebitis.   At the time of discharge, the patient was free from complaints, signs and  symptoms of distress.  He is afebrile.  Vital signs are stable.  He is to  follow up with Dr. Peter M. [place], Dr. Anna Genre. Little, and Dr. Ramon Dredge B.  Gerhardt.   DISCHARGE LABORATORY DATA:  White blood cell count 11.8, hemoglobin 10.4,  hematocrit 33.0, platelet count 352,000.  PT 20.2, INR 2.2, sodium 140,  potassium 4.6, glucose 91, BUN 13, creatinine 1.2.   CONSULTATIONS:  1. Peter M. [place], M.D.  2. Gwenith Daily. Tyrone Sage, M.D.   CONDITION ON DISCHARGE:  Good.   DISPOSITION:  Discharge to home.   FOLLOW UP:  The patient is scheduled to follow up with Dr. Peter M. [place]  on the Friday following discharge.  CVTS service will be calling the patient  to schedule a follow-up appointment and the patient is instructed to see Dr.  Caryn Bee L. Little within seven days.      Ellender Hose. Davis, N.P.                     Melissa L. Ladona Ridgel, MD    SMD/MEDQ  D:  05/18/2003  T:  05/18/2003  Job:  [id number]

## 2010-12-09 NOTE — Op Note (Signed)
NAME:  Adrian Lambert                        ACCOUNT NO.:  [id number]   MEDICAL RECORD NO.:  [id number]                   PATIENT TYPE:  INP   LOCATION:  5732                                 FACILITY:  [facility]   PHYSICIAN:  Abigail Miyamoto, M.D.              DATE OF BIRTH:  [birth year]   DATE OF PROCEDURE:  10/06/2003  DATE OF DISCHARGE:                                 OPERATIVE REPORT   PREOPERATIVE DIAGNOSIS:  History of diverticulitis.   POSTOPERATIVE DIAGNOSIS:  History of diverticulitis.   PROCEDURE:  Laparoscopic-assisted left hemicolectomy.   SURGEON:  Abigail Miyamoto, M.D.   ASSISTANT:  Gita Kudo, M.D.   ANESTHESIA:  General endotracheal anesthesia.   ESTIMATED BLOOD LOSS:  Minimal.   INDICATIONS:  Adrian Lambert is a 70 year old gentleman who several months  ago had a severe bout of complicated diverticulitis with peridiverticular  abscess needing percutaneous drainage.  He is now resolved from that process  and the decision has been made to proceed with elective left partial  colectomy.  Perioperatively he underwent endoscopy by Petra Kuba, M.D.,  which was fairly unremarkable.   FINDINGS:  The patient was found to have a left colon full of diverticula  without any evidence of acute inflammation or peridiverticular abscess.   PROCEDURE IN DETAIL:  The patient was brought to the operating room and  identified as Adrian Lambert.  He was placed supine on the operating table  and general anesthesia was induced.  The patient was then placed in  lithotomy position.  His abdomen was then prepped and draped in the usual  sterile fashion.  Using a #15 blade, a small vertical incision was made  below the umbilicus.  The incision was carried down through the fascia,  which was then opened with a scalpel.  A hemostat was then used to pass into  the peritoneal cavity.  A 0 Vicryl pursestring suture was then placed around  the fascial opening.  The Hasson port was  placed through the opening and  insufflation of the abdomen was begun.  Next a 5 mm port was placed in the  right lower quadrant under direct vision.  Then an 11 mm port was placed in  the patient's midline in the upper midline under direct vision as well.  The  patient was then placed in a Trendelenburg position.  The sigmoid colon was  found to be quite redundant and down in the pelvis.  Some of the sigmoid  colon was also stuck to the pelvic sidewall.  The left colon was mobilized  along the white line of Toldt with the Harmonic scalpel.  The sigmoid colon  appeared to peel easily off the lateral pelvic and abdominal sidewall.  The  ureter on the left side was identified and spared.  Mobilization continued  up the left colon toward the splenic flexure.  This likewise was done with  the Harmonic scalpel.  The colon continued to be mobilized further along the  left side and easily fit toward the midline, and again the redundant sigmoid  elevated easily out of the pelvis.  The white line of Toldt was taken down  toward the pelvis as well and toward the pelvic reflection with the Harmonic  scalpel.  At this point after enough colon was mobilized, a decision was  made to convert to the open part of the procedure. Next a transverse  incision was made in the patient's left lower quadrant.  The incision was  carried down through the fascia with the electrocautery.  The muscle fibers  underneath the viscera were then cauterized with the electrocautery,  separating them.  The peritoneum was then opened the entire length of the  incision as well.  The colon was then pulled up out of the incision.  Slightly more mobilization had to be performed with the electrocautery just  to free up one further band of colon.  The rectum was identified and  proximal left colon as well that was free of diverticula.  Allen bowel  clamps were then brought across the proximal and distal colon and Kocher  clamps as  well, and the colon was transected both proximally and distally  with the scalpel.  The mesentery between the two segments of transected  colon were then taken down with Kelly clamps and 2-0 silk ties.  The entire  sigmoid and left colon were then completely removed from the field and sent  to pathology for identification.  The end of colon and rectum that were left  in place were then examined, and the mucosa appeared quite viable.  A silk  end-to-end anastomosis was then performed using interrupted 3-0 silk  sutures.  The posterior part of the anastomosis was performed first and then  the anterior part.  The circumferential anastomosis appeared to be intact  without any tension, and the ends appeared quite viable.  This anastomosis  was then returned into the abdominal cavity.  The abdominal cavity was then  irrigated with several liters of normal saline.  Again, hemostasis appeared  to be achieved.  The posterior fascia and peritoneum were then closed with a  running 0 Vicryl suture.  The anterior fascia was then closed with a running  #1 PDS suture.  The skin was then irrigated and closed with skin staples.  The abdomen was reinsufflated and the anastomosis was examined and found to  be intact, and the abdominal cavity appeared free of any gross hemorrhage.  All ports were then removed under direct vision, and the abdomen was  deflated.  The 0 Vicryl at the umbilicus was then tied in place,closing the  fascial defect.  All port sites were then closed with skin staples.  The  patient tolerated the procedure well.  All sponge, needle, and instrument  counts were correct at the end of the procedure.  The patient was then  extubated in the operating room and taken in stable condition to the  recovery room.                                               Abigail Miyamoto, M.D.    DB/MEDQ  D:  10/06/2003  T:  10/07/2003  Job:  [id number]  cc:   Petra Kuba, M.D.  [address]  [place]  [place] [address]  Fax: [phone]

## 2010-12-09 NOTE — Discharge Summary (Signed)
NAME:  BRIANA, NEWMAN              ACCOUNT NO.:  [id number]   MEDICAL RECORD NO.:  [id number]          PATIENT TYPE:  INP   LOCATION:  5732                         FACILITY:  [facility]   PHYSICIAN:  Corinna L. Lendell Caprice, MDDATE OF BIRTH:  10-01-1940   DATE OF ADMISSION:  07/19/2006  DATE OF DISCHARGE:  07/24/2006                               DISCHARGE SUMMARY   DISCHARGE DIAGNOSES:  1. Biliary pancreatitis.  2. Cholelithiasis.  3. Chronic cough.  4. 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  5. History of diverticular abscess with subsequent laparoscopic      assisted left hemicolectomy.  6. Hypertension.  7. History of gout.  8. Benign prostatic hypertrophy.  9. Pulmonary fibrosis.   DISCHARGE MEDICATIONS:  1. Lovenox 100 mg subcutaneously twice a day until INR is greater than      or equal to 2.05.  2. Warfarin 10 mg p.o. today and then 5 mg everyday.  3. Tessalon Perles 100 mg p.o. t.i.d. as needed for cough.  4. Exforge 5/160 mg day.  5. Vicodin every 4 hours as needed for pain.  6. UroXatral 10 mg a day.   Follow up with Dr. Clarene Duke within a week to make any necessary  adjustments on Coumadin.  Follow up with Dr. Orson Slick in 2-3 weeks.  Activity, no heavy lifting for 2 weeks.  Diet, low-salt.  Coumadin  friendly.  Condition stable.   CONSULTATIONS:  Dr. Doy Mince,  Dr. Jacqulyn Bath.   PROCEDURES:  Laparoscopic cholecystectomy with operative cholangiograms  on 07/23/2006 by Dr. Orson Slick   PERTINENT LABORATORY:  White blood cell count on admission was 19,000.  Otherwise unremarkable CBC.  At discharge his white count is 6.  INR on  admission was 2.0, PTT 43.  At discharge is 1.5.  Complete metabolic  panel on admission is significant for an AST of 218, ALT of 247, total  bilirubin 2.9, alkaline phosphate is normal.  Lipase 1506.  Amylase 506.  At discharge his lipase is 64, AST is 38, ALT 77, total bilirubin 1.4.   SPECIAL STUDIES IN RADIOLOGY:   CT of the pancreas showed findings  suspicious for acute cholecystitis.  No evidence for acute pancreatitis,  __________  complications, fatty liver, renal  cyst.  Right upper  quadrant ultrasound showed suspicion for acute cholecystitis.  Intraoperative cholangiogram showed negative was negative for retained  common duct stone.  EKG showed normal sinus rhythm with occasional PVC,  nonspecific interventricular conduction delay.   HISTORY AND HOSPITAL COURSE:  Mr. Duran is a pleasant 70 year old  white male patient of Dr. Catha Gosselin who had been seen by Dr. Rosey Bath the day of admission in the office.  He had acute onset of  vomiting and epigastric pain as well as fevers.  An outpatient amylase,  lipase and LFTs were done which showed evidence of pancreatitis and  transaminitis.  He had an outpatient CAT scan and ultrasound which  showed possibly some edema around the gallbladder but otherwise fairly  unremarkable.  Patient was directly admitted for treatment of the acute  pancreatitis.  Please see  H&P for complete admission details.  He had a  temperature of 101.2, pulse 112, blood pressure 90/69, respiratory rate  20.  He had voluntary guarding and tenderness in the upper abdomen.  The  patient was made n.p.o.  Started on IV fluids, antiemetics and pain  medications.  He had fevers and was started empirically on antibiotics.  Because he had a mechanical aortic valve, we did not perform an MRCP but  there was concern of a passed gallstone due to his increased LFTs.  He  does have a history of drinking 2-6 drinks a night but denies previous  pancreatitis or liver disease.  Dr. Bosie Clos was consulted and he  recommended dedicated pancreatic CAT scan which was done.  Subsequently  a right upper quadrant ultrasound was done.  Despite having a normal  alkaline phosphatase, we suspect that he did have biliary pancreatitis  and may have passed a stone.  He was on Coumadin as an  outpatient and  surgery was consulted to evaluate for a cholecystectomy.  His  pancreatitis improved clinically and by enzymes his Coumadin was held  and he was started on heparin drip.  He subsequently underwent a  laparoscopic cholecystectomy in house and did quite well.  At this time,  he will be discharged home on Coumadin and Lovenox until his INR is  greater than 2.5.  His INR was somewhat subtherapeutic on admission and  I have increased his Coumadin dose.  Home health has been arranged to  assist with Lovenox injections and daily INRs until his Coumadin is  therapeutic at 2.5 to 3.5.   His blood pressure initially was borderline low and his  antihypertensives were held throughout the entirety of his  hospitalization.  At the time of discharge, his blood pressure is about  120 systolic.  His appetite has been good and he has a blood pressure  cuff at home.  I told him that he can resume his blood pressure  medication once his blood pressure began to rise but he may need to hold  it for a day or two to prevent hypotension.   He has complaints of continued cough.  Apparently this has been worked  up as an outpatient by Dr. Sherene Sires and he was started initially on Protonix  which caused some diarrhea and was subsequently stopped.  The patient  was given Tessalon here and this did improve his symptoms.   His other medical problems remained stable during his hospitalization.      Corinna L. Lendell Caprice, MD  Electronically Signed     CLS/MEDQ  D:  07/24/2006  T:  07/24/2006  Job:  [id number]   cc:   Caryn Bee L. Little, M.D.  Lebron Conners, M.D.  Shirley Friar, MD

## 2011-06-21 ENCOUNTER — Other Ambulatory Visit: Payer: Self-pay | Admitting: Dermatology

## 2011-06-28 ENCOUNTER — Encounter: Payer: Self-pay | Admitting: Cardiology

## 2011-06-28 ENCOUNTER — Ambulatory Visit (INDEPENDENT_AMBULATORY_CARE_PROVIDER_SITE_OTHER): Payer: Medicare Other | Admitting: Cardiology

## 2011-06-28 VITALS — BP 138/80 | HR 72 | Ht 70.0 in | Wt 221.8 lb

## 2011-06-28 DIAGNOSIS — I359 Nonrheumatic aortic valve disorder, unspecified: Secondary | ICD-10-CM

## 2011-06-28 DIAGNOSIS — I10 Essential (primary) hypertension: Secondary | ICD-10-CM

## 2011-06-28 DIAGNOSIS — I452 Bifascicular block: Secondary | ICD-10-CM

## 2011-06-28 DIAGNOSIS — Z952 Presence of prosthetic heart valve: Secondary | ICD-10-CM

## 2011-06-28 DIAGNOSIS — Z8679 Personal history of other diseases of the circulatory system: Secondary | ICD-10-CM

## 2011-06-28 NOTE — Progress Notes (Signed)
Adrian Lambert Date of Birth: 11-15-1940 Medical Record #[id number]  History of Present Illness: [name] is seen today for yearly followup. He has a history of severe aortic stenosis and is status post aortic valve replacement with a #25 mm [employer] mechanical prosthesis September 2004. He is on chronic Coumadin therapy. He reports that he has done well over the past year. His wife has been ill and as a result he has had to eat out more and has gained 6 pounds. He denies any chest pain, shortness of breath, or palpitations. His Coumadin is followed by Dr. Clarene Lambert.  Current Outpatient Prescriptions on File Prior to Visit  Medication Sig Dispense Refill  . allopurinol (ZYLOPRIM) 300 MG tablet Take 300 mg by mouth daily.        Marland Kitchen amLODipine-valsartan (EXFORGE) 5-160 MG per tablet Take 1 tablet by mouth daily.        . finasteride (PROSCAR) 5 MG tablet Take 5 mg by mouth daily.        . fish oil-omega-3 fatty acids 1000 MG capsule Take 1 g by mouth daily.        Marland Kitchen warfarin (COUMADIN) 5 MG tablet Take 5 mg by mouth daily.          Allergies  Allergen Reactions  . Celecoxib     Past Medical History  Diagnosis Date  . History of hypertension   . History of kidney stones   . SOB (shortness of breath)   . Fatigue   . Aortic stenosis 2004    critical aortic stenosis  . History of kidney stones   . AF (atrial fibrillation)   . Atrial flutter   . Syncopal episodes     rule out significant arrhythmia  . Fever     Fever of uncertain etiology  . Atelectasis     on chest x-ray which appears to be resolving  . Gout     involving the great toe of the left foot  . Phlebitis     resolved  . Abdominal pain, left lower quadrant     secondary to diverticulitis  . Diverticulitis   . Gouty arthritis   . Obesity   . Pancreatitis     Biliary pancreatitis  . Cholelithiasis   . Chronic cough   . Pulmonary fibrosis     Past Surgical History  Procedure Date  . Doppler echocardiography  January 2004  . Aortic valve replacement April 13, 2003  . Hemicolectomy 10/06/2003    Laparoscopic-assisted left hemicolectomy    History  Smoking status  . Unknown If Ever Smoked  Smokeless tobacco  . Not on file    History  Alcohol Use No    Family History  Problem Relation Age of Onset  . Heart failure Mother   . Diabetes Mother     Review of Systems: As noted in history of present illness.  All other systems were reviewed and are negative.  Physical Exam: BP 138/80  Pulse 72  Ht 5\' 10"  (1.778 m)  Wt 100.608 kg (221 lb 12.8 oz)  BMI 31.83 kg/m2 The patient is alert and oriented x 3.  The mood and affect are normal.  The skin is warm and dry.  Color is normal.  The HEENT exam reveals that the sclera are nonicteric.  The mucous membranes are moist.  The carotids are 2+ without bruits.  There is no thyromegaly.  There is no JVD.  The lungs are clear.  The chest  wall is non tender.  The heart exam reveals a regular rate with a good mechanical aortic valve click.  There are no murmurs, gallops, or rubs.  The PMI is not displaced.   Abdominal exam reveals good bowel sounds.  There is no guarding or rebound.  There is no hepatosplenomegaly or tenderness.  There are no masses.  Exam of the legs reveal no clubbing, cyanosis, or edema.  The legs are without rashes.  The distal pulses are intact.  Cranial nerves II - XII are intact.  Motor and sensory functions are intact.  The gait is normal.  LABORATORY DATA: ECG today demonstrates normal sinus rhythm with occasional PACs. 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 These findings are old.  Assessment / Plan:

## 2011-06-28 NOTE — Patient Instructions (Signed)
Continue your current medications.  Try and lose weight.  I will see you again in 1 year.

## 2011-06-28 NOTE — Assessment & Plan Note (Signed)
He has a left anterior fascicular block and right bundle branch block. This is asymptomatic and chronic.

## 2011-06-28 NOTE — Assessment & Plan Note (Signed)
Blood pressure is well controlled on his current therapy. We will continue.

## 2011-06-28 NOTE — Assessment & Plan Note (Signed)
Valve is functioning well by exam. He is asymptomatic. His last echocardiogram was in October of 2009. He needs to continue on long-term anticoagulation with Coumadin. He needs routine SBE prophylaxis. I will followup again in one year.

## 2012-01-17 ENCOUNTER — Other Ambulatory Visit: Payer: Self-pay | Admitting: Dermatology

## 2012-06-26 ENCOUNTER — Encounter (HOSPITAL_COMMUNITY): Payer: Self-pay | Admitting: General Practice

## 2012-06-26 ENCOUNTER — Inpatient Hospital Stay (HOSPITAL_COMMUNITY)
Admission: AD | Admit: 2012-06-26 | Discharge: 2012-06-30 | DRG: 603 | Disposition: A | Discharge status: Home / Self Care | Payer: Medicare Other | Source: Ambulatory Visit | Attending: Internal Medicine | Admitting: Internal Medicine

## 2012-06-26 DIAGNOSIS — Z7901 Long term (current) use of anticoagulants: Secondary | ICD-10-CM

## 2012-06-26 DIAGNOSIS — L0390 Cellulitis, unspecified: Secondary | ICD-10-CM | POA: Diagnosis present

## 2012-06-26 DIAGNOSIS — Z6832 Body mass index (BMI) 32.0-32.9, adult: Secondary | ICD-10-CM

## 2012-06-26 DIAGNOSIS — Z952 Presence of prosthetic heart valve: Secondary | ICD-10-CM

## 2012-06-26 DIAGNOSIS — Z954 Presence of other heart-valve replacement: Secondary | ICD-10-CM

## 2012-06-26 DIAGNOSIS — I4891 Unspecified atrial fibrillation: Secondary | ICD-10-CM | POA: Diagnosis present

## 2012-06-26 DIAGNOSIS — Z79899 Other long term (current) drug therapy: Secondary | ICD-10-CM

## 2012-06-26 DIAGNOSIS — L02419 Cutaneous abscess of limb, unspecified: Principal | ICD-10-CM | POA: Diagnosis present

## 2012-06-26 DIAGNOSIS — I10 Essential (primary) hypertension: Secondary | ICD-10-CM | POA: Diagnosis present

## 2012-06-26 DIAGNOSIS — M109 Gout, unspecified: Secondary | ICD-10-CM | POA: Diagnosis present

## 2012-06-26 DIAGNOSIS — Z792 Long term (current) use of antibiotics: Secondary | ICD-10-CM

## 2012-06-26 DIAGNOSIS — Z9049 Acquired absence of other specified parts of digestive tract: Secondary | ICD-10-CM

## 2012-06-26 DIAGNOSIS — Z87442 Personal history of urinary calculi: Secondary | ICD-10-CM

## 2012-06-26 DIAGNOSIS — Z888 Allergy status to other drugs, medicaments and biological substances status: Secondary | ICD-10-CM

## 2012-06-26 DIAGNOSIS — L0291 Cutaneous abscess, unspecified: Secondary | ICD-10-CM

## 2012-06-26 DIAGNOSIS — E669 Obesity, unspecified: Secondary | ICD-10-CM | POA: Diagnosis present

## 2012-06-26 HISTORY — DX: Unspecified osteoarthritis, unspecified site: M19.90

## 2012-06-26 HISTORY — DX: Adverse effect of unspecified anesthetic, initial encounter: T41.45XA

## 2012-06-26 HISTORY — DX: Other complications of anesthesia, initial encounter: T88.59XA

## 2012-06-26 HISTORY — DX: Essential (primary) hypertension: I10

## 2012-06-26 LAB — LACTIC ACID, PLASMA: Lactic Acid, Venous: 2.3 mmol/L — ABNORMAL HIGH (ref 0.5–2.2)

## 2012-06-26 LAB — CBC
HCT: 40 % (ref 39.0–52.0)
MCH: 19.9 pg — ABNORMAL LOW (ref 26.0–34.0)
MCHC: 33 g/dL (ref 30.0–36.0)
MCV: 60.4 fL — ABNORMAL LOW (ref 78.0–100.0)
RDW: 15.8 % — ABNORMAL HIGH (ref 11.5–15.5)

## 2012-06-26 LAB — COMPREHENSIVE METABOLIC PANEL
Albumin: 3.7 g/dL (ref 3.5–5.2)
BUN: 19 mg/dL (ref 6–23)
Calcium: 9.2 mg/dL (ref 8.4–10.5)
Creatinine, Ser: 0.98 mg/dL (ref 0.50–1.35)
Total Bilirubin: 0.8 mg/dL (ref 0.3–1.2)
Total Protein: 6.6 g/dL (ref 6.0–8.3)

## 2012-06-26 MED ORDER — ACETAMINOPHEN 650 MG RE SUPP: 650.0000 mg | Freq: Four times a day (QID) | RECTAL | Status: DC | PRN

## 2012-06-26 MED ORDER — ONDANSETRON HCL 4 MG/2ML IJ SOLN: 4.0000 mg | Freq: Four times a day (QID) | INTRAMUSCULAR | Status: DC | PRN

## 2012-06-26 MED ORDER — FINASTERIDE 5 MG PO TABS
5.0000 mg | ORAL_TABLET | Freq: Every day | ORAL | Status: DC
  Administered 2012-06-26 – 2012-06-30 (×5): 5 mg via ORAL
  Filled 2012-06-26 (×5): qty 1

## 2012-06-26 MED ORDER — OXYCODONE HCL 5 MG PO TABS
5.0000 mg | ORAL_TABLET | ORAL | Status: DC | PRN
  Administered 2012-06-27 – 2012-06-30 (×9): 5 mg via ORAL
  Filled 2012-06-26 (×9): qty 1

## 2012-06-26 MED ORDER — VANCOMYCIN HCL 10 G IV SOLR
2000.0000 mg | Freq: Once | INTRAVENOUS | Status: AC
  Administered 2012-06-26: 2000 mg via INTRAVENOUS
  Filled 2012-06-26: qty 2000

## 2012-06-26 MED ORDER — ACETAMINOPHEN 325 MG PO TABS
650.0000 mg | ORAL_TABLET | Freq: Four times a day (QID) | ORAL | Status: DC | PRN
  Administered 2012-06-29 – 2012-06-30 (×3): 650 mg via ORAL
  Filled 2012-06-26 (×3): qty 2

## 2012-06-26 MED ORDER — WARFARIN - PHARMACIST DOSING INPATIENT
Freq: Every day | Status: DC
  Administered 2012-06-27: 18:00:00

## 2012-06-26 MED ORDER — ENOXAPARIN SODIUM 40 MG/0.4ML ~~LOC~~ SOLN
40.0000 mg | SUBCUTANEOUS | Status: DC
  Filled 2012-06-26: qty 0.4

## 2012-06-26 MED ORDER — ALLOPURINOL 300 MG PO TABS
300.0000 mg | ORAL_TABLET | Freq: Every day | ORAL | Status: DC
  Administered 2012-06-27 – 2012-06-30 (×4): 300 mg via ORAL
  Filled 2012-06-26 (×5): qty 1

## 2012-06-26 MED ORDER — ONDANSETRON HCL 4 MG PO TABS: 4.0000 mg | ORAL_TABLET | Freq: Four times a day (QID) | ORAL | Status: DC | PRN

## 2012-06-26 MED ORDER — VANCOMYCIN HCL IN DEXTROSE 1-5 GM/200ML-% IV SOLN
1000.0000 mg | Freq: Two times a day (BID) | INTRAVENOUS | Status: DC
  Administered 2012-06-27 – 2012-06-30 (×7): 1000 mg via INTRAVENOUS
  Filled 2012-06-26 (×9): qty 200

## 2012-06-26 MED ORDER — AMLODIPINE BESYLATE-VALSARTAN 5-160 MG PO TABS: 1.0000 | ORAL_TABLET | Freq: Every day | ORAL | Status: DC

## 2012-06-26 MED ORDER — AMLODIPINE BESYLATE 5 MG PO TABS
5.0000 mg | ORAL_TABLET | Freq: Every day | ORAL | Status: DC
  Administered 2012-06-27 – 2012-06-30 (×4): 5 mg via ORAL
  Filled 2012-06-26 (×5): qty 1

## 2012-06-26 MED ORDER — IRBESARTAN 150 MG PO TABS
150.0000 mg | ORAL_TABLET | Freq: Every day | ORAL | Status: DC
  Administered 2012-06-27 – 2012-06-30 (×4): 150 mg via ORAL
  Filled 2012-06-26 (×5): qty 1

## 2012-06-26 MED ORDER — WARFARIN SODIUM 5 MG PO TABS
5.0000 mg | ORAL_TABLET | Freq: Once | ORAL | Status: AC
  Administered 2012-06-26: 5 mg via ORAL
  Filled 2012-06-26: qty 1

## 2012-06-26 MED ORDER — SODIUM CHLORIDE 0.9 % IV SOLN
INTRAVENOUS | Status: DC
  Administered 2012-06-26 – 2012-06-30 (×6): via INTRAVENOUS

## 2012-06-26 MED ORDER — SENNA 8.6 MG PO TABS
1.0000 | ORAL_TABLET | Freq: Every day | ORAL | Status: DC | PRN
  Administered 2012-06-29: 8.6 mg via ORAL
  Filled 2012-06-26: qty 1

## 2012-06-26 NOTE — H&P (Signed)
[facility]          History and Physical    PCP:  Mickie Hillier, MD   Cardiologist Dr. Peter [place]  Chief Complaint:  Direct admit for cellulitis  HPI: [name] is a 71 year old white male with past medical history of mechanical aortic valve replacement on Coumadin, paroxysmal atrial fibrillation, hemicolectomy, hypertension etc. was in his usual state of health until Friday evening. Patient reports wearing new trekking boots While taking his dog for a walk on Friday subsequently noticed a little bit of friction and irritation above his right ankle. And Saturday he noticed pain and a little bit of swelling above his right ankle which progressively got worse and red and hot by the next day. The redness and swelling started spreading upwards to involve 1/2 of his right lower leg, he had a suspicion for gout and went to see his primary care doctor who gave him a IM shot of Unasyn on Monday and Tuesday, and sent him home on oral Augmentin,  he had checked labs and asked him to return on Wednesday, he was seen today by Dr. Madelin Rear and since there wasn't much improvement in the redness or swelling and in fact his white count went up to 14.9 today with low-grade fevers in the office he was sent to [place] as a direct admit. History of feeling feverish and chills over the past couple of days. Patient denies history of diabetes, any trauma, insect or animal bites. He denies any pain or swelling in his calf   Allergies:   Allergies  Allergen Reactions  . Celecoxib       Past Medical History  Diagnosis Date  . History of hypertension   . History of kidney stones   . SOB (shortness of breath)   . Fatigue   . Aortic stenosis 2004    critical aortic stenosis  . History of kidney stones   . AF (atrial fibrillation)   . Atrial flutter   . Syncopal episodes     rule out significant arrhythmia  . Fever     Fever of uncertain etiology  . Atelectasis     on  chest x-ray which appears to be resolving  . Gout     involving the great toe of the left foot  . Phlebitis     resolved  . Abdominal pain, left lower quadrant     secondary to diverticulitis  . Diverticulitis   . Gouty arthritis   . Obesity   . Pancreatitis     Biliary pancreatitis  . Cholelithiasis   . Chronic cough   . Pulmonary fibrosis     Past Surgical History  Procedure Date  . Doppler echocardiography January 2004  . Aortic valve replacement April 13, 2003  . Hemicolectomy 10/06/2003    Laparoscopic-assisted left hemicolectomy    Prior to Admission medications   Medication Sig Start Date End Date Taking? Authorizing Provider  allopurinol (ZYLOPRIM) 300 MG tablet Take 300 mg by mouth daily.      Historical Provider, MD  amLODipine-valsartan (EXFORGE) 5-160 MG per tablet Take 1 tablet by mouth daily.      Historical Provider, MD  AMOXICILLIN PO Take by mouth as needed.      Historical Provider, MD  FIBER PO Take by mouth daily.      Historical Provider, MD  finasteride (PROSCAR) 5 MG tablet Take 5 mg by mouth daily.      Historical Provider, MD  fish oil-omega-3 fatty  acids 1000 MG capsule Take 1 g by mouth daily.      Historical Provider, MD  warfarin (COUMADIN) 5 MG tablet Take 5 mg by mouth daily.      Historical Provider, MD    Social History:Married, lives at home with his wife, drinks alcohol only on occasions, denies smoking cigarettes   has an unknown smoking status. He does not have any smokeless tobacco history on file. He reports that he does not drink alcohol or use illicit drugs.  Family History  Problem Relation Age of Onset  . Heart failure Mother   . Diabetes Mother     Review of Systems:  Constitutional: Denies fever, chills, diaphoresis, appetite change and fatigue.  HEENT: Denies photophobia, eye pain, redness, hearing loss, ear pain, congestion, sore throat, rhinorrhea, sneezing, mouth sores, trouble swallowing, neck pain, neck stiffness  and tinnitus.   Respiratory: Denies SOB, DOE, cough, chest tightness,  and wheezing.   Cardiovascular: Denies chest pain, palpitations and leg swelling.  Gastrointestinal: Denies nausea, vomiting, abdominal pain, diarrhea, constipation, blood in stool and abdominal distention.  Genitourinary: Denies dysuria, urgency, frequency, hematuria, flank pain and difficulty urinating.  Musculoskeletal: Denies myalgias, back pain, joint swelling, arthralgias and gait problem.  Skin: Denies pallor, rash and wound.  Neurological: Denies dizziness, seizures, syncope, weakness, light-headedness, numbness and headaches.  Hematological: Denies adenopathy. Easy bruising, personal or family bleeding history  Psychiatric/Behavioral: Denies suicidal ideation, mood changes, confusion, nervousness, sleep disturbance and agitation   Physical Exam: Blood pressure 129/80, pulse 92, temperature 98.2 F (36.8 C), temperature source Oral, resp. rate 18, height 5\' 10"  (1.778 m), weight 101.606 kg (224 lb), SpO2 98.00%. General alert awake oriented well-developed well-nourished male in no acute distress HEENT pupils equal reactive to light oral mucosa moist and pink neck obese no JVD CVS S1-S2 regular rate rhythm metallic click noted no murmurs rubs or gallops Lungs clear auscultation bilaterally Abdomen soft obese nontender with normal bowel sounds no organomegaly Extremities right lower extremity with swelling erythema and redness involving the distal half of his lower leg, no areas of skin breakdown, dorsalis the pedis and posterior tibial pulses appreciated, distal sensations and capillary refill and intact Neurological moves all extremities no localizing signs Psychiatric appropriate mood and affect  Labs on Admission: Labs from PCPs office WBC of 14.9 with 82% granulocytes, hemoglobin of 13.4 and platelet count of 245 INR 2.6  Radiological Exams on Admission: No results found.  Assessment/Plan  1. Right  lower extremity cellulitis failed outpatient treatment   admit to MedSurg bed IV vancomycin Check stat B. met and lactic acid Elevated right lower extremity Since there is no history of diabetes or animal bites I do not feel the need for gram-negative coverage at this point.  2. history of aortic stenosis status post metallic aortic valve replacement in 2004 Continue Coumadin per pharmacy  3. history paroxysmal atrial fibrillation: Currently rate controlled, in sinus rhythm by auscultation, continue Coumadin  4.  HYPERTENSION: Continue home antihypertensives  DVT prophylaxis with Lovenox    CODE STATUS full Family communication: Discussed the plan with patient at bedside Disposition inpatient  S/P AVR (aortic valve replacement)    Time Spent on Admission:  [facility] Pager: [phone] 06/26/2012, 5:28 PM

## 2012-06-26 NOTE — Progress Notes (Signed)
ARRIVED TO 6N#16, AMBULATES WITH CANE, DR. Jomarie Longs NOTIFIED OF ADMIT, ORIENTED TO ROOM AND SURROUNDINGS, DENIES NAUSEA, C/O RIGHT LEGPAIN 4/10

## 2012-06-26 NOTE — Progress Notes (Signed)
ANTIBIOTIC & ANTICOAGULATION CONSULT NOTE - INITIAL  Pharmacy Consult for Vancomycin; Coumadin Indication: cellulitis; hx AVR and Afib  Allergies  Allergen Reactions  . Celecoxib   Patient Measurements: Height: 5\' 10"  (177.8 cm) Weight: 224 lb (101.606 kg) IBW/kg (Calculated) : 73  Vital Signs: Temp: 98.2 F (36.8 C) (12/04 1648) Temp src: Oral (12/04 1648) BP: 129/80 mmHg (12/04 1648) Pulse Rate: 92  (12/04 1648)  Labs: 12/4: SCr 0.98, WBC 11.9, LA 2.3, H/H 13.2/40, Plts 182  Microbiology: No results found for this or any previous visit (from the past 720 hour(s)).  Medications:  Prescriptions prior to admission  Medication Sig Dispense Refill  . allopurinol (ZYLOPRIM) 300 MG tablet Take 300 mg by mouth daily.        Marland Kitchen amLODipine-valsartan (EXFORGE) 5-160 MG per tablet Take 1 tablet by mouth daily.        . AMOXICILLIN PO Take by mouth as needed.        Marland Kitchen FIBER PO Take by mouth daily.        . finasteride (PROSCAR) 5 MG tablet Take 5 mg by mouth daily.        . fish oil-omega-3 fatty acids 1000 MG capsule Take 1 g by mouth daily.        Marland Kitchen warfarin (COUMADIN) 5 MG tablet Take 5 mg by mouth daily.         Assessment: 71 YO obese male with AVR and Afib on Coumadin PTA now admitted with cellulitis of right lower extremity to start Vancomycin and resume Coumadin per pharmacy dosing.  WBC is mildly elevated. SCr is 0.98/estCrCl~ 80-7mL/min.  No cultures pending.   INR is therapeutic on admission at 2.11.  Home Coumadin regimen was 5mg  daily with last dose on 06/25/12.   Goal of Therapy:  Vancomycin trough level 10-15 mcg/ml  Plan:  1. Vancomycin 2g IV now, then 1g IV q12h. 2. Follow-up cultures, renal function, and clinical status. 3. Coumadin 5mg  po x1 tonight. 4. Follow-up daily PT/INR.   Fayne Norrie 06/26/2012,5:55 PM

## 2012-06-27 LAB — BASIC METABOLIC PANEL
BUN: 17 mg/dL (ref 6–23)
Chloride: 104 mEq/L (ref 96–112)
GFR calc Af Amer: >90 mL/min (ref >90)
Potassium: 4.7 mEq/L (ref 3.5–5.1)

## 2012-06-27 LAB — CBC
HCT: 40.8 % (ref 39.0–52.0)
Hemoglobin: 13.5 g/dL (ref 13.0–17.0)
RDW: 15.8 % — ABNORMAL HIGH (ref 11.5–15.5)
WBC: 12.1 10*3/uL — ABNORMAL HIGH (ref 4.0–10.5)

## 2012-06-27 LAB — PROTIME-INR
INR: 2.03 — ABNORMAL HIGH (ref 0.00–1.49)
Prothrombin Time: 22.1 seconds — ABNORMAL HIGH (ref 11.6–15.2)

## 2012-06-27 MED ORDER — WARFARIN SODIUM 6 MG PO TABS
6.0000 mg | ORAL_TABLET | Freq: Once | ORAL | Status: AC
  Administered 2012-06-27: 6 mg via ORAL
  Filled 2012-06-27 (×2): qty 1

## 2012-06-27 MED ORDER — PIPERACILLIN-TAZOBACTAM 3.375 G IVPB
3.3750 g | Freq: Three times a day (TID) | INTRAVENOUS | Status: DC
  Administered 2012-06-27 – 2012-06-30 (×9): 3.375 g via INTRAVENOUS
  Filled 2012-06-27 (×11): qty 50

## 2012-06-27 NOTE — Progress Notes (Signed)
[facility] PROGRESS NOTE  [name]:[id number] DOB: [birth year] DOA: 06/26/2012 PCP: Mickie Hillier, MD  Assessment/Plan: Active Problems:  HYPERTENSION  S/P AVR (aortic valve replacement)  Cellulitis    1. Right lower extremity cellulitis failed outpatient treatment  Add Zosyn to vancomycin Doubt DVT and INR is therapeutic   2. history of aortic stenosis status post metallic aortic valve replacement in 2004  Continue Coumadin per pharmacy , INR is 2.03 today, pharmacy to continue dosing  3. history paroxysmal atrial fibrillation: Currently rate controlled, in sinus rhythm by auscultation, continue Coumadin  4. 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 was in his usual state of health until Friday evening. Patient reports wearing new trekking boots While taking his dog for a walk on Friday subsequently noticed a little bit of friction and irritation above his right ankle.  And Saturday he noticed pain and a little bit of swelling above his right ankle which progressively got worse and red and hot by the next day. The redness and swelling started spreading upwards to involve 1/2 of his right lower leg, he had a suspicion for gout and went to see his primary care doctor who gave him a IM shot of Unasyn on Monday and Tuesday, and sent him home on oral Augmentin, he had checked labs and asked him to return on Wednesday, he was seen today by Dr. Madelin Rear and since there wasn't much improvement in the redness or swelling and in fact his white count went up to 14.9 today with low-grade fevers in the office he was sent to [place] as a direct  admit.  History of feeling feverish and chills over the past couple of days.  Patient denies history of diabetes, any trauma, insect or animal bites. He denies any pain or swelling in his calf   Consultants:  Pharmacy  Procedures:  None  Antibiotics: Vancomycin started 12/4 HPI/Subjective: Somewhat improved today  Objective: Filed Vitals:   06/26/12 2128 06/27/12 0203 06/27/12 0549 06/27/12 0930  BP: 125/85 123/76 121/79 121/81  Pulse: 89 86 81 88  Temp: 98.7 F (37.1 C) 98.6 F (37 C) 98.7 F (37.1 C) 98.6 F (37 C)  TempSrc: Oral Oral Oral Oral  Resp: 18 18 18 18   Height:      Weight:      SpO2: 96% 97% 95% 97%    Intake/Output Summary (Last 24 hours) at 06/27/12 1253 Last data filed at 06/27/12 0930  Gross per 24 hour  Intake    600 ml  Output    900 ml  Net   -300 ml    Exam:  HENT:  Head: Atraumatic.  Nose: Nose normal.  Mouth/Throat: Oropharynx is clear and moist.  Eyes: Conjunctivae are normal. Pupils are equal, round, and reactive to light. No scleral icterus.  Neck: Neck supple. No tracheal deviation present.  Cardiovascular: Normal rate, regular rhythm, normal heart sounds and intact distal pulses.  Pulmonary/Chest: Effort normal and breath sounds normal. No respiratory distress.  Abdominal: Soft. Normal appearance and bowel sounds are normal. She exhibits no distension. There is no tenderness.  Musculoskeletal: She exhibits no edema and no tenderness.  Neurological: She is alert. No cranial nerve deficit.  Data Reviewed: Basic Metabolic Panel:  Lab 06/27/12 9147 06/26/12 1741  NA 141 137  K 4.7 4.4  CL 104 99  CO2 28 28  GLUCOSE 104* 133*  BUN 17 19  CREATININE 0.96 0.98  CALCIUM 9.2 9.2  MG -- --  PHOS -- --    Liver Function Tests:  Lab 06/26/12 1741  AST 30  ALT 34  ALKPHOS 92  BILITOT 0.8  PROT 6.6  ALBUMIN 3.7   No results found for this basename: LIPASE:5,AMYLASE:5 in the last 168 hours No results found for this  basename: AMMONIA:5 in the last 168 hours  CBC:  Lab 06/27/12 0540 06/26/12 1741  WBC 12.1* 11.9*  NEUTROABS -- --  HGB 13.5 13.2  HCT 40.8 40.0  MCV 60.5* 60.4*  PLT 197 182    Cardiac Enzymes: No results found for this basename: CKTOTAL:5,CKMB:5,CKMBINDEX:5,TROPONINI:5 in the last 168 hours BNP (last 3 results) No results found for this basename: PROBNP:3 in the last 8760 hours   CBG: No results found for this basename: GLUCAP:5 in the last 168 hours  No results found for this or any previous visit (from the past 240 hour(s)).   Studies: No results found.  Scheduled Meds:   . allopurinol  300 mg Oral Daily  . amLODipine  5 mg Oral Daily  . finasteride  5 mg Oral Daily  . irbesartan  150 mg Oral Daily  . [COMPLETED] vancomycin  2,000 mg Intravenous Once  . vancomycin  1,000 mg Intravenous Q12H  . [COMPLETED] warfarin  5 mg Oral ONCE-1800  . Warfarin - Pharmacist Dosing Inpatient   Does not apply q1800  . [DISCONTINUED] amLODipine-valsartan  1 tablet Oral Daily  . [DISCONTINUED] enoxaparin (LOVENOX) injection  40 mg Subcutaneous Q24H   Continuous Infusions:   . sodium chloride 75 mL/hr at 06/27/12 1200    Active Problems:  HYPERTENSION  S/P AVR (aortic valve replacement)  Cellulitis    Time spent: 40 minutes   [facility]  [facility] Pager [phone]. If 8PM-8AM, please contact night-coverage at [email or web address], password [facility] 06/27/2012, 12:53 PM  LOS: 1 day

## 2012-06-27 NOTE — Progress Notes (Signed)
ANTIBIOTIC & ANTICOAGULATION CONSULT NOTE - INITIAL  Pharmacy Consult for Zosyn/Coumadin Indication: cellulitis; hx AVR and Afib  Allergies  Allergen Reactions  . Celecoxib Itching and Rash    Celebrex  Patient Measurements: Height: 5\' 10"  (177.8 cm) Weight: 224 lb (101.606 kg) IBW/kg (Calculated) : 73  Vital Signs: Temp: 98.6 F (37 C) (12/05 0930) Temp src: Oral (12/05 0930) BP: 121/81 mmHg (12/05 0930) Pulse Rate: 88  (12/05 0930)  Labs: 12/4: SCr 0.98, WBC 11.9, LA 2.3, H/H 13.2/40, Plts 182  Microbiology: No results found for this or any previous visit (from the past 720 hour(s)).  Medications:  Prescriptions prior to admission  Medication Sig Dispense Refill  . allopurinol (ZYLOPRIM) 300 MG tablet Take 300 mg by mouth daily.       Marland Kitchen amLODipine-valsartan (EXFORGE) 5-160 MG per tablet Take 1 tablet by mouth daily.       Marland Kitchen amoxicillin-clavulanate (AUGMENTIN) 875-125 MG per tablet Take 1 tablet by mouth 2 (two) times daily.      . colchicine 0.6 MG tablet Take 0.6 mg by mouth daily.      . finasteride (PROSCAR) 5 MG tablet Take 5 mg by mouth daily.       Marland Kitchen warfarin (COUMADIN) 5 MG tablet Take 5 mg by mouth daily.       . fish oil-omega-3 fatty acids 1000 MG capsule Take 1 g by mouth daily.        Assessment: 71 YO obese male with AVR and Afib on Coumadin PTA now admitted with cellulitis of right lower extremity and started Vancomycin on 12/4. Pt. Is afebrile, wbc slightly elevated at 12.1. No cultures, renal function stable, est. crcl 45ml/min, will add zosyn.   Coumadin has been resumed per pharmacy dosing. INR low-end therapeutic today at 2.03, slightly dropped from yesterday. Cbc stable, no bleeding per chart.  Home Coumadin regimen was 5mg  daily with last dose on 06/25/12.   Goal of Therapy:  INR = 2-3  Plan:  1. Zosyn 3.375 g IV Q8 (4 hr infusion) 2. Coumadin 6mg  po x1 tonight. 3. Follow-up daily PT/INR.  4. renal function, and clinical status  Bayard Hugger,  PharmD, BCPS  Clinical Pharmacist  Pager: [phone]  06/27/2012,1:10 PM

## 2012-06-27 NOTE — Progress Notes (Signed)
UR completed 

## 2012-06-28 ENCOUNTER — Inpatient Hospital Stay (HOSPITAL_COMMUNITY): Payer: Medicare Other

## 2012-06-28 LAB — URIC ACID: Uric Acid, Serum: 4.4 mg/dL (ref 4.0–7.8)

## 2012-06-28 MED ORDER — COLCHICINE 0.6 MG PO TABS
0.6000 mg | ORAL_TABLET | Freq: Two times a day (BID) | ORAL | Status: DC
  Administered 2012-06-28 – 2012-06-30 (×5): 0.6 mg via ORAL
  Filled 2012-06-28 (×6): qty 1

## 2012-06-28 MED ORDER — MORPHINE SULFATE 4 MG/ML IJ SOLN
4.0000 mg | Freq: Once | INTRAMUSCULAR | Status: AC
  Administered 2012-06-28: 4 mg via INTRAVENOUS
  Filled 2012-06-28: qty 1

## 2012-06-28 MED ORDER — WARFARIN SODIUM 5 MG PO TABS
5.0000 mg | ORAL_TABLET | Freq: Every day | ORAL | Status: DC
  Administered 2012-06-28 – 2012-06-29 (×2): 5 mg via ORAL
  Filled 2012-06-28 (×3): qty 1

## 2012-06-28 NOTE — Progress Notes (Addendum)
[facility] PROGRESS NOTE  JAMONTA GOERNER RUE:[id number] DOB: 10-06-1940 DOA: 06/26/2012 PCP: Mickie Hillier, MD  Assessment/Plan: Active Problems:  HYPERTENSION  S/P AVR (aortic valve replacement)  Cellulitis    1. Right lower extremity cellulitis failed outpatient treatment  Add Zosyn to vancomycin  Doubt DVT and INR is therapeutic , patient also seems to think that he may have gout, we'll check a uric acid plain films, start colchicine 2. history of aortic stenosis status post metallic aortic valve replacement in 2004  Continue Coumadin per pharmacy , INR is 2.03 today, pharmacy to continue dosing  3. history paroxysmal atrial fibrillation: Currently rate controlled, in sinus rhythm by auscultation, continue Coumadin  4. HYPERTENSION: Continue home antihypertensives    Code Status: full  Family Communication: family updated about patient's clinical progress  Disposition Plan: As above  Brief narrative:  Mr. 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 was in his usual state of health until Friday evening. Patient reports wearing new trekking boots While taking his dog for a walk on Friday subsequently noticed a little bit of friction and irritation above his right ankle.  And Saturday he noticed pain and a little bit of swelling above his right ankle which progressively got worse and red and hot by the next day. The redness and swelling started spreading upwards to involve 1/2 of his right lower leg, he had a suspicion for gout and went to see his primary care doctor who gave him a IM shot of Unasyn on Monday and Tuesday, and sent him home on oral Augmentin, he had checked labs and asked him to return on Wednesday, he was seen today by Dr. Madelin Rear and since there wasn't much improvement in the redness or swelling and in fact his white count  went up to 14.9 today with low-grade fevers in the office he was sent to [place] as a direct admit.  History of feeling feverish and chills over the past couple of days.  Patient denies history of diabetes, any trauma, insect or animal bites. He denies any pain or swelling in his calf  Consultants:  Pharmacy Procedures:  None Antibiotics:  Vancomycin started 12/4  HPI/Subjective:  Somewhat improved today   Objective: Filed Vitals:   06/27/12 1432 06/27/12 1748 06/27/12 2152 06/28/12 0559  BP: 130/76 129/81 124/78 117/79  Pulse: 85 90 85 76  Temp: 98.9 F (37.2 C) 99 F (37.2 C) 99.1 F (37.3 C) 98.2 F (36.8 C)  TempSrc: Oral Oral Oral Oral  Resp: 18 18 18 18   Height:      Weight:      SpO2: 98% 99% 99% 95%    Intake/Output Summary (Last 24 hours) at 06/28/12 1152 Last data filed at 06/28/12 0850  Gross per 24 hour  Intake 3978.75 ml  Output   1000 ml  Net 2978.75 ml    Exam:  HENT:  Head: Atraumatic.  Nose: Nose normal.  Mouth/Throat: Oropharynx is clear and moist.  Eyes: Conjunctivae are normal. Pupils are equal, round, and reactive to light. No scleral icterus.  Neck: Neck supple. No tracheal deviation present.  Cardiovascular: Normal rate, regular rhythm, normal heart sounds and intact distal pulses.  Pulmonary/Chest: Effort normal and breath sounds normal. No respiratory distress.  Abdominal: Soft. Normal appearance and bowel sounds are normal. She exhibits no distension. There is no tenderness.  Musculoskeletal: She exhibits no edema and no tenderness.  Neurological: She is alert. No cranial nerve deficit.    Data Reviewed: Basic Metabolic Panel:  Lab 06/27/12 1610 06/26/12 1741  NA 141 137  K 4.7 4.4  CL 104 99  CO2 28 28  GLUCOSE 104* 133*  BUN 17 19  CREATININE 0.96 0.98  CALCIUM 9.2 9.2  MG -- --  PHOS -- --    Liver Function Tests:  Lab 06/26/12 1741  AST 30  ALT 34  ALKPHOS 92  BILITOT 0.8  PROT 6.6  ALBUMIN 3.7   No  results found for this basename: LIPASE:5,AMYLASE:5 in the last 168 hours No results found for this basename: AMMONIA:5 in the last 168 hours  CBC:  Lab 06/27/12 0540 06/26/12 1741  WBC 12.1* 11.9*  NEUTROABS -- --  HGB 13.5 13.2  HCT 40.8 40.0  MCV 60.5* 60.4*  PLT 197 182    Cardiac Enzymes: No results found for this basename: CKTOTAL:5,CKMB:5,CKMBINDEX:5,TROPONINI:5 in the last 168 hours BNP (last 3 results) No results found for this basename: PROBNP:3 in the last 8760 hours   CBG: No results found for this basename: GLUCAP:5 in the last 168 hours  No results found for this or any previous visit (from the past 240 hour(s)).   Studies: No results found.  Scheduled Meds:   . allopurinol  300 mg Oral Daily  . amLODipine  5 mg Oral Daily  . finasteride  5 mg Oral Daily  . irbesartan  150 mg Oral Daily  . [COMPLETED]  morphine injection  4 mg Intravenous Once  . piperacillin-tazobactam (ZOSYN)  IV  3.375 g Intravenous Q8H  . vancomycin  1,000 mg Intravenous Q12H  . [COMPLETED] warfarin  6 mg Oral ONCE-1800  . Warfarin - Pharmacist Dosing Inpatient   Does not apply q1800   Continuous Infusions:   . sodium chloride 75 mL/hr at 06/28/12 1149    Active Problems:  HYPERTENSION  S/P AVR (aortic valve replacement)  Cellulitis    Time spent: 40 minutes   [facility]  [facility] Pager [phone]. If 8PM-8AM, please contact night-coverage at [email or web address], password [facility] 06/28/2012, 11:52 AM  LOS: 2 days

## 2012-06-28 NOTE — Progress Notes (Deleted)
No change in pt since initial assessment 

## 2012-06-28 NOTE — Progress Notes (Signed)
ANTICOAGULATION CONSULT NOTE - Follow Up Consult  Pharmacy Consult:  Coumadin Indication:  Hx AVR and Afib  Allergies  Allergen Reactions  . Celecoxib Itching and Rash    Celebrex    Patient Measurements: Height: 5\' 10"  (177.8 cm) Weight: 224 lb (101.606 kg) IBW/kg (Calculated) : 73   Vital Signs: Temp: 98.2 F (36.8 C) (12/06 0559) Temp src: Oral (12/06 0559) BP: 117/79 mmHg (12/06 0559) Pulse Rate: 76  (12/06 0559)  Labs:  Basename 06/28/12 0605 06/27/12 0540 06/26/12 1818 06/26/12 1741  HGB -- 13.5 -- 13.2  HCT -- 40.8 -- 40.0  PLT -- 197 -- 182  APTT -- -- -- --  LABPROT 23.9* 22.1* 22.8* --  INR 2.25* 2.03* 2.11* --  HEPARINUNFRC -- -- -- --  CREATININE -- 0.96 -- 0.98  CKTOTAL -- -- -- --  CKMB -- -- -- --  TROPONINI -- -- -- --    Estimated Creatinine Clearance: 84.3 ml/min (by C-G formula based on Cr of 0.96).      Assessment: 71 YO obese male with AVR and Afib to continue on Coumadin therapy.  INR remains therapeutic, no bleeding reported.   Goal of Therapy:  INR 2-3 Monitor platelets by anticoagulation protocol: Yes Vanc trough 10-15 mcg/mL     Plan:  - Continue Coumadin 5mg  PO daily - Daily PT / INR for now - Vanc 1gm IV Q12H - Zosyn 3.375gm IV Q8H, 4 hr infusion - Monitor renal fxn, clinical progress, vanc trough if indicated     Jerzy Roepke D. Laney Potash, PharmD, BCPS Pager:  [phone] 06/28/2012, 12:30 PM

## 2012-06-29 ENCOUNTER — Inpatient Hospital Stay (HOSPITAL_COMMUNITY): Payer: Medicare Other

## 2012-06-29 LAB — CBC
Hemoglobin: 12.3 g/dL — ABNORMAL LOW (ref 13.0–17.0)
Platelets: 204 10*3/uL (ref 150–400)
RBC: 6.09 MIL/uL — ABNORMAL HIGH (ref 4.22–5.81)
WBC: 10.4 10*3/uL (ref 4.0–10.5)

## 2012-06-29 MED ORDER — IOHEXOL 300 MG/ML  SOLN
80.0000 mL | Freq: Once | INTRAMUSCULAR | Status: AC | PRN
  Administered 2012-06-29: 80 mL via INTRAVENOUS

## 2012-06-29 MED ORDER — METHYLPREDNISOLONE SODIUM SUCC 40 MG IJ SOLR
40.0000 mg | Freq: Every day | INTRAMUSCULAR | Status: DC
  Administered 2012-06-29 – 2012-06-30 (×2): 40 mg via INTRAVENOUS
  Filled 2012-06-29 (×2): qty 1

## 2012-06-29 MED ORDER — DOXYCYCLINE HYCLATE 100 MG PO TABS: 100.0000 mg | ORAL_TABLET | Freq: Two times a day (BID) | ORAL | Status: AC

## 2012-06-29 MED ORDER — AMOXICILLIN-POT CLAVULANATE 875-125 MG PO TABS: 1.0000 | ORAL_TABLET | Freq: Two times a day (BID) | ORAL | Status: AC

## 2012-06-29 MED ORDER — POLYETHYLENE GLYCOL 3350 17 G PO PACK
17.0000 g | PACK | Freq: Every day | ORAL | Status: DC
  Administered 2012-06-29 – 2012-06-30 (×2): 17 g via ORAL
  Filled 2012-06-29 (×2): qty 1

## 2012-06-29 MED ORDER — OXYCODONE HCL 5 MG PO TABS: 5.0000 mg | ORAL_TABLET | Freq: Three times a day (TID) | ORAL | Status: DC | PRN

## 2012-06-29 NOTE — Evaluation (Signed)
Physical Therapy Evaluation Patient Details Name: Adrian Lambert MRN: [id number] DOB: Mar 05, 1941 Today's Date: 06/29/2012 Time: 4098-1191 PT Time Calculation (min): 13 min  PT Assessment / Plan / Recommendation Clinical Impression  Pt admitted with RLE cellulitis and gout. Pt with minimal gait deficits secondary to pain with RLE weight bearing. Pt able to complete all mobility at a supervision level only, no physical assist needed. All education completed for a safe d/c. No further PT needs, will not follow    PT Assessment  Patent does not need any further PT services    Follow Up Recommendations       Does the patient have the potential to tolerate intense rehabilitation      Barriers to Discharge        Equipment Recommendations       Recommendations for Other Services     Frequency      Precautions / Restrictions Precautions Precautions: None Restrictions Weight Bearing Restrictions: Yes RLE Weight Bearing: Weight bearing as tolerated   Pertinent Vitals/Pain Pain in RLE 6/10. RN aware. Pain meds given.       Mobility  Bed Mobility Bed Mobility: Not assessed Transfers Transfers: Sit to Stand;Stand to Sit Sit to Stand: 6: Modified independent (Device/Increase time) Stand to Sit: 6: Modified independent (Device/Increase time) Ambulation/Gait Ambulation/Gait Assistance: 5: Supervision Ambulation Distance (Feet): 150 Feet Assistive device: Straight cane Ambulation/Gait Assistance Details: Supervision secondary to pt with increased limp secondary to pain and decreased tolerance of RLE weight bearing. No gait deficits Gait Pattern: Step-to pattern;Decreased stance time - right;Decreased weight shift to right;Antalgic Gait velocity: slow gait speed Stairs: Yes Stairs Assistance: 5: Supervision Stairs Assistance Details (indicate cue type and reason): Supervision for safety only Stair Management Technique: One rail Left;Step to pattern;Forwards;With cane Number  of Stairs: 4     Shoulder Instructions     Exercises     PT Diagnosis:    PT Problem List:   PT Treatment Interventions:     PT Goals    Visit Information  Last PT Received On: 06/29/12 Assistance Needed: +1    Subjective Data  Patient Stated Goal: to go home son   Prior Functioning  Home Living Lives With: Spouse Available Help at Discharge: Family;Available 24 hours/day Type of Home: House Home Access: Stairs to enter [employer] of Steps: 2 Entrance Stairs-Rails: Right;Left Home Layout: Two level Alternate Level Stairs-Number of Steps: 12 Alternate Level Stairs-Rails: Right Bathroom Shower/Tub: Walk-in shower;Door [employer] Toilet: Handicapped height Bathroom Accessibility: Yes How Accessible: Accessible via walker Home Adaptive Equipment: Straight cane;Walker - rolling;Shower chair without back Prior Function Level of Independence: Independent Able to Take Stairs?: Yes Driving: Yes Vocation: Retired Musician: No difficulties Dominant Hand: Right    Cognition  Overall Cognitive Status: Appears within functional limits for tasks assessed/performed Arousal/Alertness: Awake/alert Orientation Level: Appears intact for tasks assessed Behavior During Session: [facility] for tasks performed    Extremity/Trunk Assessment Right Lower Extremity Assessment RLE ROM/Strength/Tone: Unable to fully assess;Due to pain RLE Sensation: WFL - Light Touch Left Lower Extremity Assessment LLE ROM/Strength/Tone: Within functional levels LLE Sensation: WFL - Light Touch   Balance    End of Session PT - End of Session Equipment Utilized During Treatment: Gait belt Activity Tolerance: Patient limited by pain Patient left: in chair;with call bell/phone within reach Nurse Communication: Mobility status  GP     Milana Kidney 06/29/2012, 5:30 PM  06/29/2012 Milana Kidney DPT PAGER: [phone] OFFICE: [phone]

## 2012-06-29 NOTE — Discharge Summary (Signed)
Physician Discharge Summary  CORDEL DREWES MRN: [id number] DOB/AGE: 1941-07-21 71 y.o.  PCP: Mickie Hillier, MD   Admit date: 06/26/2012 Discharge date: 06/29/2012  Discharge Diagnoses:  Acute gout flare  HYPERTENSION  S/P AVR (aortic valve replacement)  Cellulitis     Medication List     As of 06/29/2012 10:13 AM    TAKE these medications         allopurinol 300 MG tablet   Commonly known as: ZYLOPRIM   Take 300 mg by mouth daily.      amLODipine-valsartan 5-160 MG per tablet   Commonly known as: EXFORGE   Take 1 tablet by mouth daily.      amoxicillin-clavulanate 875-125 MG per tablet   Commonly known as: AUGMENTIN   Take 1 tablet by mouth 2 (two) times daily.      colchicine 0.6 MG tablet   Take 0.6 mg by mouth daily.      doxycycline 100 MG tablet   Commonly known as: VIBRA-TABS   Take 1 tablet (100 mg total) by mouth 2 (two) times daily.      finasteride 5 MG tablet   Commonly known as: PROSCAR   Take 5 mg by mouth daily.      fish oil-omega-3 fatty acids 1000 MG capsule   Take 1 g by mouth daily.      oxyCODONE 5 MG immediate release tablet   Commonly known as: Oxy IR/ROXICODONE   Take 1 tablet (5 mg total) by mouth every 8 (eight) hours as needed.      warfarin 5 MG tablet   Commonly known as: COUMADIN   Take 5 mg by mouth daily.        Discharge Condition: *Stable   Disposition:    Consults:  Significant Diagnostic Studies: Dg Foot 2 Views Right  06/28/2012  *RADIOLOGY REPORT*  Clinical Data: Right foot pain, swelling.  Evaluate for infection.  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 Areas of erosions noted within the first metatarsal head and adjacent proximal phalanx.  No acute bony abnormality. Specifically, no fracture, subluxation, or dislocation.  Soft tissues are intact.   Small plantar calcaneal spur.  No radiographic changes of osteomyelitis.   IMPRESSION: Stable degenerative changes with erosions within the right first MTP joint. No acute bony abnormality.   Original Report Authenticated By: Charlett Nose, M.D.      Microbiology: No results found for this or any previous visit (from the past 240 hour(s)).   Labs: Results for orders placed during the hospital encounter of 06/26/12 (from the past 48 hour(s))  PROTIME-INR     Status: Abnormal   Collection Time   06/28/12  6:05 AM      Component Value Range Comment   Prothrombin Time 23.9 (*) 11.6 - 15.2 seconds    INR 2.25 (*) 0.00 - 1.49   URIC ACID     Status: Normal   Collection Time   06/28/12  3:17 PM      Component Value Range Comment   Uric Acid, Serum 4.4  4.0 - 7.8 mg/dL   PROTIME-INR     Status: Abnormal   Collection Time   06/29/12  6:40 AM      Component Value Range Comment   Prothrombin Time 22.9 (*) 11.6 - 15.2 seconds    INR 2.13 (*) 0.00 - 1.49   CBC     Status: Abnormal  Collection Time   06/29/12  6:40 AM      Component Value Range Comment   WBC 10.4  4.0 - 10.5 K/uL    RBC 6.09 (*) 4.22 - 5.81 MIL/uL    Hemoglobin 12.3 (*) 13.0 - 17.0 g/dL    HCT 40.9  81.1 - 91.4 %    MCV 71.8 (*) 78.0 - 100.0 fL    MCH 20.2 (*) 26.0 - 34.0 pg    MCHC 28.1 (*) 30.0 - 36.0 g/dL    RDW 78.2 (*) 95.6 - 15.5 %    Platelets 204  150 - 400 K/uL      Adrian  Mr. Lambert is a 71 year old white male with past medical history of mechanical aortic valve replacement on Coumadin, paroxysmal atrial fibrillation, hemicolectomy, hypertension etc. was in his usual state of health until Friday evening. Patient reports wearing new trekking boots While taking his dog for a walk on Friday subsequently noticed a little bit of friction and irritation above his right ankle.  And Saturday he noticed pain and a little bit of swelling above his right ankle which progressively got worse and red and hot by the next day. The redness and swelling started spreading upwards to involve 1/2 of his right  lower leg, he had a suspicion for gout and went to see his primary care doctor who gave him a IM shot of Unasyn on Monday and Tuesday, and sent him home on oral Augmentin, he had checked labs and asked him to return on Wednesday, he was seen today by Dr. Madelin Rear and since there wasn't much improvement in the redness or swelling and in fact his white count went up to 14.9 today with low-grade fevers in the office he was sent to [place] as a direct admit.  History of feeling feverish and chills over the past couple of days.  Patient denies history of diabetes, any trauma, insect or animal bites. He denies any pain or swelling in his calf   HOSPITAL COURSE:  #1 right lower extremity cellulitis initially started on Zosyn and vancomycin He is clinically improved He is being switched to doxycycline and Augmentin for another 10 days  #2 history of aortic stenosis Continue anticoagulation he has been therapeutic during this hospitalization  #3 acute gout. The patient is on allopurinol and colchicine, colchicine was resumed, plain films were negative, his uric acid was 4.4  #4 paroxysmal atrial fibrillation rate controlled on anticoagulation  #5 hypertension stable     Discharge Exam:  Blood pressure 122/79, pulse 89, temperature 99 F (37.2 C), temperature source Oral, resp. rate 12, height 5\' 10"  (1.778 m), weight 101.606 kg (224 lb), SpO2 95.00%. General alert awake oriented well-developed well-nourished male in no acute distress  HEENT pupils equal reactive to light oral mucosa moist and pink neck obese no JVD  CVS S1-S2 regular rate rhythm metallic click noted no murmurs rubs or gallops  Lungs clear auscultation bilaterally  Abdomen soft obese nontender with normal bowel sounds no organomegaly  Extremities right lower extremity with swelling erythema and redness involving the distal half of his lower leg, no areas of skin breakdown, dorsalis the pedis and posterior tibial pulses  appreciated, distal sensations and capillary refill and intact  Neurological moves all extremities no localizing signs  Psychiatric appropriate mood and affect         Signed: Hazley Dezeeuw 06/29/2012, 10:13 AM

## 2012-06-29 NOTE — Progress Notes (Signed)
ANTICOAGULATION CONSULT NOTE - Follow Up Consult  Pharmacy Consult:  Coumadin Indication:  History of AVR and Afib  Allergies  Allergen Reactions  . Celecoxib Itching and Rash    Celebrex    Patient Measurements: Height: 5\' 10"  (177.8 cm) Weight: 224 lb (101.606 kg) IBW/kg (Calculated) : 73   Vital Signs: Temp: 99 F (37.2 C) (12/07 0551) Temp src: Oral (12/07 0551) BP: 122/79 mmHg (12/07 0551) Pulse Rate: 89  (12/07 0551)  Labs:  Basename 06/29/12 0640 06/28/12 0605 06/27/12 0540 06/26/12 1741  HGB 12.3* -- 13.5 --  HCT 43.7 -- 40.8 40.0  PLT 204 -- 197 182  APTT -- -- -- --  LABPROT 22.9* 23.9* 22.1* --  INR 2.13* 2.25* 2.03* --  HEPARINUNFRC -- -- -- --  CREATININE -- -- 0.96 0.98  CKTOTAL -- -- -- --  CKMB -- -- -- --  TROPONINI -- -- -- --    Estimated Creatinine Clearance: 84.3 ml/min (by C-G formula based on Cr of 0.96).      Assessment: 71 YO obese male with AVR and Afib to continue on Coumadin therapy.  INR remains therapeutic, no bleeding reported.   Goal of Therapy:  INR 2-3 Monitor platelets by anticoagulation protocol: Yes Vanc trough 10-15 mcg/mL     Plan:  - Continue Coumadin 5mg  PO daily - Daily PT / INR for now - Vanc 1gm IV Q12H - Zosyn 3.375gm IV Q8H, 4 hr infusion - Monitor renal fxn, clinical progress, vanc trough if indicated - BMET in AM    Future Yeldell D. Laney Potash, PharmD, BCPS Pager:  [phone] 06/29/2012, 1:37 PM

## 2012-06-30 LAB — PROTIME-INR
INR: 2.46 — ABNORMAL HIGH (ref 0.00–1.49)
Prothrombin Time: 25.5 seconds — ABNORMAL HIGH (ref 11.6–15.2)

## 2012-06-30 LAB — BASIC METABOLIC PANEL
Chloride: 103 mEq/L (ref 96–112)
GFR calc Af Amer: >90 mL/min (ref >90)
Potassium: 4.1 mEq/L (ref 3.5–5.1)
Sodium: 138 mEq/L (ref 135–145)

## 2012-06-30 MED ORDER — PREDNISONE 5 MG PO TABS: 30.0000 mg | ORAL_TABLET | Freq: Every day | ORAL | Status: AC

## 2012-06-30 NOTE — Progress Notes (Signed)
Pt wheeled out for discharge home accomp by wife.

## 2012-06-30 NOTE — Discharge Summary (Signed)
Physician Discharge Summary  Adrian Lambert MRN: [id number] DOB/AGE: 71-10-1940 71 y.o.  PCP: Mickie Hillier, MD   Admit date: 06/26/2012 Discharge date: 06/30/2012  Discharge Diagnoses:  Discharge Diagnoses:  Acute gout flare  HYPERTENSION  S/P AVR (aortic valve replacement)  Cellulitis       Medication List     As of 06/30/2012 10:32 AM    TAKE these medications         allopurinol 300 MG tablet   Commonly known as: ZYLOPRIM   Take 300 mg by mouth daily.      amLODipine-valsartan 5-160 MG per tablet   Commonly known as: EXFORGE   Take 1 tablet by mouth daily.      amoxicillin-clavulanate 875-125 MG per tablet   Commonly known as: AUGMENTIN   Take 1 tablet by mouth 2 (two) times daily.      colchicine 0.6 MG tablet   Take 0.6 mg by mouth daily.      doxycycline 100 MG tablet   Commonly known as: VIBRA-TABS   Take 1 tablet (100 mg total) by mouth 2 (two) times daily.      finasteride 5 MG tablet   Commonly known as: PROSCAR   Take 5 mg by mouth daily.      fish oil-omega-3 fatty acids 1000 MG capsule   Take 1 g by mouth daily.      oxyCODONE 5 MG immediate release tablet   Commonly known as: Oxy IR/ROXICODONE   Take 1 tablet (5 mg total) by mouth every 8 (eight) hours as needed.      predniSONE 5 MG tablet   Commonly known as: DELTASONE   Take 6 tablets (30 mg total) by mouth daily.      warfarin 5 MG tablet   Commonly known as: COUMADIN   Take 5 mg by mouth daily.        Discharge Condition: *Stable  Disposition:  Consults:   Significant Diagnostic Studies: Ct Foot Right W Contrast  06/29/2012  *RADIOLOGY REPORT*  Clinical Data:  Right lower extremity pain and swelling.  CT RIGHT LOWER EXTREMITY WITH CONTRAST  Technique:  Multidetector CT imaging of the right lower extremity was performed according to the standard protocol following intravenous contrast administration. Multiplanar CT image reconstructions were also generated.   Contrast: 80mL OMNIPAQUE IOHEXOL 300 MG/ML  SOLN  Comparison:  Right foot radiographs  Findings:  There is diffuse subcutaneous soft tissue swelling/edema involving the entire lower extremity but most notable involving the foot.  No discrete rim enhancing fluid collection to suggest a soft tissue abscess.  No destructive bony changes to suggest osteomyelitis.  There are changes suggestive of gouty arthritis involving the first metatarsal phalangeal joint.  IMPRESSION: Diffuse cellulitis but no findings to suggest myofasciitis, septic arthritis or focal soft tissue abscess.   Original Report Authenticated By: Rudie Meyer, M.D.    Ct Tibia Fibula Right W Contrast  06/29/2012  *RADIOLOGY REPORT*  Clinical Data:  Right lower extremity pain and swelling.  CT RIGHT LOWER EXTREMITY WITH CONTRAST  Technique:  Multidetector CT imaging of the right lower extremity was performed according to the standard protocol following intravenous contrast administration. Multiplanar CT image reconstructions were also generated.  Contrast: 80mL OMNIPAQUE IOHEXOL 300 MG/ML  SOLN  Comparison:  Right foot radiographs  Findings:  There is diffuse subcutaneous soft tissue swelling/edema involving the entire lower extremity but most notable involving the foot.  No discrete rim enhancing fluid collection to suggest a soft  tissue abscess.  No destructive bony changes to suggest osteomyelitis.  There are changes suggestive of gouty arthritis involving the first metatarsal phalangeal joint.  IMPRESSION: Diffuse cellulitis but no findings to suggest myofasciitis, septic arthritis or focal soft tissue abscess.   Original Report Authenticated By: Rudie Meyer, M.D.    Dg Foot 2 Views Right  06/28/2012  *RADIOLOGY REPORT*  Clinical Data: Right foot pain, swelling.  Evaluate for infection.  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 Areas of erosions noted within the first metatarsal  head and adjacent proximal phalanx.  No acute bony abnormality. Specifically, no fracture, subluxation, or dislocation.  Soft tissues are intact.   Small plantar calcaneal spur.  No radiographic changes of osteomyelitis.  IMPRESSION: Stable degenerative changes with erosions within the right first MTP joint. No acute bony abnormality.   Original Report Authenticated By: Charlett Nose, M.D.      Microbiology: Recent Results (from the past 240 hour(s))  CULTURE, BLOOD (ROUTINE X 2)     Status: Normal (Preliminary result)   Collection Time   06/29/12  1:55 PM      Component Value Range Status Comment   Specimen Description BLOOD ARM LEFT   Final    Special Requests BOTTLES DRAWN AEROBIC AND ANAEROBIC 10CC EACH   Final    Culture  Setup Time 06/29/2012 22:02   Final    Culture     Final    Value:        BLOOD CULTURE RECEIVED NO GROWTH TO DATE CULTURE WILL BE HELD FOR 5 DAYS BEFORE ISSUING A FINAL NEGATIVE REPORT   Report Status PENDING   Incomplete   CULTURE, BLOOD (ROUTINE X 2)     Status: Normal (Preliminary result)   Collection Time   06/29/12  2:00 PM      Component Value Range Status Comment   Specimen Description BLOOD LEFT ARM   Final    Special Requests     Final    Value: BOTTLES DRAWN AEROBIC AND ANAEROBIC 10CC AER 5CC ANA   Culture  Setup Time 06/29/2012 22:02   Final    Culture     Final    Value:        BLOOD CULTURE RECEIVED NO GROWTH TO DATE CULTURE WILL BE HELD FOR 5 DAYS BEFORE ISSUING A FINAL NEGATIVE REPORT   Report Status PENDING   Incomplete      Labs: Results for orders placed during the hospital encounter of 06/26/12 (from the past 48 hour(s))  URIC ACID     Status: Normal   Collection Time   06/28/12  3:17 PM      Component Value Range Comment   Uric Acid, Serum 4.4  4.0 - 7.8 mg/dL   PROTIME-INR     Status: Abnormal   Collection Time   06/29/12  6:40 AM      Component Value Range Comment   Prothrombin Time 22.9 (*) 11.6 - 15.2 seconds    INR 2.13 (*) 0.00 -  1.49   CBC     Status: Abnormal   Collection Time   06/29/12  6:40 AM      Component Value Range Comment   WBC 10.4  4.0 - 10.5 K/uL    RBC 6.09 (*) 4.22 - 5.81 MIL/uL    Hemoglobin 12.3 (*) 13.0 - 17.0 g/dL    HCT 40.9  81.1 - 91.4 %    MCV 71.8 (*)  78.0 - 100.0 fL    MCH 20.2 (*) 26.0 - 34.0 pg    MCHC 28.1 (*) 30.0 - 36.0 g/dL    RDW 16.1 (*) 09.6 - 15.5 %    Platelets 204  150 - 400 K/uL   CULTURE, BLOOD (ROUTINE X 2)     Status: Normal (Preliminary result)   Collection Time   06/29/12  1:55 PM      Component Value Range Comment   Specimen Description BLOOD ARM LEFT      Special Requests BOTTLES DRAWN AEROBIC AND ANAEROBIC 10CC EACH      Culture  Setup Time 06/29/2012 22:02      Culture        Value:        BLOOD CULTURE RECEIVED NO GROWTH TO DATE CULTURE WILL BE HELD FOR 5 DAYS BEFORE ISSUING A FINAL NEGATIVE REPORT   Report Status PENDING     CULTURE, BLOOD (ROUTINE X 2)     Status: Normal (Preliminary result)   Collection Time   06/29/12  2:00 PM      Component Value Range Comment   Specimen Description BLOOD LEFT ARM      Special Requests        Value: BOTTLES DRAWN AEROBIC AND ANAEROBIC 10CC AER 5CC ANA   Culture  Setup Time 06/29/2012 22:02      Culture        Value:        BLOOD CULTURE RECEIVED NO GROWTH TO DATE CULTURE WILL BE HELD FOR 5 DAYS BEFORE ISSUING A FINAL NEGATIVE REPORT   Report Status PENDING     PROTIME-INR     Status: Abnormal   Collection Time   06/30/12  7:35 AM      Component Value Range Comment   Prothrombin Time 25.5 (*) 11.6 - 15.2 seconds    INR 2.46 (*) 0.00 - 1.49   BASIC METABOLIC PANEL     Status: Abnormal   Collection Time   06/30/12  7:35 AM      Component Value Range Comment   Sodium 138  135 - 145 mEq/L    Potassium 4.1  3.5 - 5.1 mEq/L    Chloride 103  96 - 112 mEq/L    CO2 25  19 - 32 mEq/L    Glucose, Bld 127 (*) 70 - 99 mg/dL    BUN 15  6 - 23 mg/dL    Creatinine, Ser 0.45  0.50 - 1.35 mg/dL    Calcium 9.1  8.4 - 40.9 mg/dL     GFR calc non Af Amer 84 (*) >90 mL/min    GFR calc Af Amer >90  >90 mL/min     HPI  Adrian Lambert is a 71 year old white male with past medical history of mechanical aortic valve replacement on Coumadin, paroxysmal atrial fibrillation, hemicolectomy, hypertension etc. was in his usual state of health until Friday evening. Patient reports wearing new trekking boots While taking his dog for a walk on Friday subsequently noticed a little bit of friction and irritation above his right ankle.  And Saturday he noticed pain and a little bit of swelling above his right ankle which progressively got worse and red and hot by the next day. The redness and swelling started spreading upwards to involve 1/2 of his right lower leg, he had a suspicion for gout and went to see his primary care doctor who gave him a IM shot of Unasyn on Monday and Tuesday, and sent  him home on oral Augmentin, he had checked labs and asked him to return on Wednesday, he was seen today by Dr. Madelin Rear and since there wasn't much improvement in the redness or swelling and in fact his white count went up to 14.9 today with low-grade fevers in the office he was sent to [place] as a direct admit.  History of feeling feverish and chills over the past couple of days.  Patient denies history of diabetes, any trauma, insect or animal bites. He denies any pain or swelling in his calf   HOSPITAL COURSE:  #1 right lower extremity cellulitis initially started on Zosyn and vancomycin  He is clinically improved  He is being switched to doxycycline and Augmentin for another 10 days  CT showed Diffuse cellulitis but no findings to suggest myofasciitis, septic  arthritis or focal soft tissue abscess. Added steroids for possible gout with significant improvement   #2 history of aortic stenosis  Continue anticoagulation he has been therapeutic during this hospitalization   #3 acute gout. The patient is on allopurinol and colchicine, colchicine  was resumed, plain films were negative, his uric acid was 4.4 . Continue steroids and colchicine  #4 paroxysmal atrial fibrillation rate controlled on anticoagulation  #5 hypertension stable    Discharge Exam:  Blood pressure 122/79, pulse 89, temperature 99 F (37.2 C), temperature source Oral, resp. rate 12, height 5\' 10"  (1.778 m), weight 101.606 kg (224 lb), SpO2 95.00%.  General alert awake oriented well-developed well-nourished male in no acute distress  HEENT pupils equal reactive to light oral mucosa moist and pink neck obese no JVD  CVS S1-S2 regular rate rhythm metallic click noted no murmurs rubs or gallops  Lungs clear auscultation bilaterally  Abdomen soft obese nontender with normal bowel sounds no organomegaly  Extremities right lower extremity with swelling erythema and redness involving the distal half of his lower leg, no areas of skin breakdown, dorsalis the pedis and posterior tibial pulses appreciated, distal sensations and capillary refill and intact  Neurological moves all extremities no localizing signs  Psychiatric appropriate mood and affect             Follow-up Information    Follow up with Mickie Hillier, MD. Schedule an appointment as soon as possible for a visit in 1 week.   Contact information:   [address] [place] [address] [phone]          Signed: Richarda Overlie 06/30/2012, 10:32 AM

## 2012-06-30 NOTE — Progress Notes (Signed)
Pt for discharge to home accomp by wife.  Rx given for augmentin, doxycycline, prednisone and oxy and explained.  All discharge instructions reviewed and copy given to pt.  FU w/ Dr. Clarene Duke for 1 week.  Pt understands what to call MD for.  No further questions about home self care.

## 2012-07-05 LAB — CULTURE, BLOOD (ROUTINE X 2): Culture: NO GROWTH

## 2012-07-19 ENCOUNTER — Telehealth: Payer: Self-pay | Admitting: Cardiology

## 2012-07-19 NOTE — Telephone Encounter (Signed)
I called and spoke with Alycia Rossetti at [facility] about the [facility]. Jude AV. Given the model number he researched that there was no problem for pt to have an MRI. Pt is aware and will inform Dr. Luiz Blare. Mylo Red RN

## 2012-07-19 NOTE — Telephone Encounter (Signed)
New problem:   Upcoming MRI ordering by Dr. Luiz Blare.  H/o artificial heart valve. Is this o.k. For him to do this.

## 2012-08-28 ENCOUNTER — Telehealth: Payer: Self-pay | Admitting: Cardiology

## 2012-08-28 NOTE — Telephone Encounter (Signed)
New Problem:    Patient called in because he needs to stop his coumadin 3 days prior to an upcomming cataract surgery and in March he needs to stop for 5 days prior to a prostate biopsy and would like to know if that would be ok.  Please call back.

## 2012-08-28 NOTE — Telephone Encounter (Signed)
Spoke to patient was told ok with Dr.Jordan to hold coumadin 3 days before cataract surgery and ok to  hold coumadin 5 days before prostate biopsy in March.

## 2012-10-08 ENCOUNTER — Telehealth: Payer: Self-pay | Admitting: Cardiology

## 2012-10-08 NOTE — Telephone Encounter (Signed)
New Problem:    Patient called in because he has a prostate biopsy, has stopped his coumadin three days ago and would like to know if he needed a Lovenox Bridge for his upcoming procedure within the next two days.  Please call back.

## 2012-10-08 NOTE — Telephone Encounter (Signed)
He does not need Lovenox bridge for prostate Bx.  Peter [place]az[place] MD, [facility]

## 2012-10-08 NOTE — Telephone Encounter (Signed)
Spoke to patient was told Dr.Jordan advised does not need a Lovenox bridge for prostate biopsy.

## 2012-10-08 NOTE — Telephone Encounter (Signed)
Spoke to patient he stated he is having a prostate biopsy tomorrow 10/09/12 and he has been holding coumadin for the past 3 days.States wants to make sure he does not need a Lovenox Bridge.Message sent to Dr.Jordan for advice.

## 2012-10-09 HISTORY — PX: PROSTATE BIOPSY: SHX241

## 2012-10-10 ENCOUNTER — Telehealth: Payer: Self-pay | Admitting: Cardiology

## 2012-10-10 NOTE — Telephone Encounter (Signed)
New problem    Pt has been off coumadin for 6 days and dr who done biopsy advise not to take coumadin until bleeding has completely stopped. Pt is concerned and want to know when should he start back. Please call pt concerning this matter.

## 2012-10-10 NOTE — Telephone Encounter (Signed)
Spoke with patient he stated he was told not to restart coumadin until bleeding stopped.States he only had a very small amount of bright red bleeding with bowel movement this morning,none since.Advised to restart coumadin this afternoon 5 mg daily.Advised to call PCP 10/11/12 for PT appointment.

## 2012-10-11 NOTE — Telephone Encounter (Signed)
Patient called no answer.LMTC. 

## 2012-10-11 NOTE — Telephone Encounter (Signed)
F/u   Patient returning nurse call, he can be reached at hm#

## 2012-11-25 ENCOUNTER — Encounter: Payer: Self-pay | Admitting: Radiation Oncology

## 2012-11-26 ENCOUNTER — Ambulatory Visit
Admission: RE | Admit: 2012-11-26 | Discharge: 2012-11-26 | Disposition: A | Discharge status: Home / Self Care | Payer: Medicare Other | Source: Ambulatory Visit | Attending: Radiation Oncology | Admitting: Radiation Oncology

## 2012-11-26 ENCOUNTER — Encounter: Payer: Self-pay | Admitting: Radiation Oncology

## 2012-11-26 VITALS — BP 124/72 | HR 78 | Temp 98.0°F | Wt 229.4 lb

## 2012-11-26 DIAGNOSIS — I4891 Unspecified atrial fibrillation: Secondary | ICD-10-CM | POA: Insufficient documentation

## 2012-11-26 DIAGNOSIS — I4892 Unspecified atrial flutter: Secondary | ICD-10-CM | POA: Insufficient documentation

## 2012-11-26 DIAGNOSIS — C61 Malignant neoplasm of prostate: Secondary | ICD-10-CM | POA: Insufficient documentation

## 2012-11-26 DIAGNOSIS — Z7901 Long term (current) use of anticoagulants: Secondary | ICD-10-CM | POA: Insufficient documentation

## 2012-11-26 DIAGNOSIS — Z954 Presence of other heart-valve replacement: Secondary | ICD-10-CM | POA: Insufficient documentation

## 2012-11-26 NOTE — Progress Notes (Signed)
[facility] Radiation Oncology NEW PATIENT EVALUATION  Name: Adrian Lambert MRN: [id number]  Date:   11/26/2012           DOB: Aug 21, 1940  Status: outpatient   CC: Mickie Hillier, MD  Marcine Matar, MD    REFERRING PHYSICIAN: Marcine Matar, MD   DIAGNOSIS: Stage TI C. intermediate risk adenocarcinoma prostate   HISTORY OF PRESENT ILLNESS:  Adrian Lambert is a 72 y.o. male who is seen today for the courtesy of Dr. Retta Diones for discussion of possible radiation therapy in the management of his stage TI C. intermediate risk adenocarcinoma prostate. In 2011 he underwent ultrasound-guided biopsies for a PSA of just over 4 and biopsies were benign. His gland volume at that time was approximately 80 cc. He was placed on finasteride. His PSA on 08/01/2012 rose to 6.37 while on finasteride. He underwent ultrasound-guided biopsies on 10/09/2012. His then have Gleason 7 (3+4) involving 40% of one core from the left lateral apex and Gleason 6 (3+3) involving less than 5% of one core from the right apex and 20% of one core from the left apex. Gland volume was approximately 49.4 cc. He still has significant obstruction symptomatology, but his I PSS score is only 11. No GI difficulties. He does have erectile dysfunction. Of note is that he is on Coumadin for aortic valve replacement.  PREVIOUS RADIATION THERAPY: No   PAST MEDICAL HISTORY:  has a past medical history of SOB (shortness of breath); Fatigue; Aortic stenosis (2004); AF (atrial fibrillation); Atrial flutter; Syncopal episodes; Fever; Atelectasis; Phlebitis; Abdominal pain, left lower quadrant; Diverticulitis; Obesity; Pancreatitis; Cholelithiasis; Chronic cough; Kidney stones; Complication of anesthesia; Hypertension; Heart murmur; Pneumonia (1970's); History of bronchitis; Gouty arthritis; Arthritis; and Cellulitis (06/26/2012).     PAST SURGICAL HISTORY:  Past Surgical History  Procedure Laterality Date  .  Doppler echocardiography  January 2004  . Aortic valve replacement  April 13, 2003  . Hemicolectomy  10/06/2003    Laparoscopic-assisted left hemicolectomy  . Colon surgery    . Cholecystectomy  ~ 2010  . Cardiac valve replacement  2004    AVR  . Cataract extraction w/ intraocular lens implant  2010    "left" (06/26/2012)  . Prostate biopsy  10/09/2012    gleason 3+4=7     FAMILY HISTORY: family history includes Diabetes in his mother; Heart failure in his mother; and Prostate cancer in his father. his follows diagnosed with prostate cancer his 28s. He does congestive heart failure at age 58. His mother died from complications of diabetes at age 30.   SOCIAL HISTORY:  reports that he has never smoked. He has never used smokeless tobacco. He reports that he drinks about 1.8 ounces of alcohol per week. He reports that he does not use illicit drugs. Married with 4 children. 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   ALLERGIES: Celecoxib   MEDICATIONS:  Current Outpatient Prescriptions  Medication Sig Dispense Refill  . allopurinol (ZYLOPRIM) 300 MG tablet Take 300 mg by mouth daily.       Marland Kitchen amLODipine-valsartan (EXFORGE) 5-160 MG per tablet Take 1 tablet by mouth daily.       . colchicine 0.6 MG tablet Take 0.6 mg by mouth daily. As needed      . finasteride (PROSCAR) 5 MG tablet Take 5 mg by mouth daily.       . polycarbophil (FIBERCON) 625 MG tablet Take 625 mg by mouth daily. 2 po bid      .  warfarin (COUMADIN) 5 MG tablet Take 5 mg by mouth daily.        No current facility-administered medications for this encounter.     REVIEW OF SYSTEMS:  Pertinent items are noted in HPI.    PHYSICAL EXAM:  weight is 229 lb 6.4 oz (104.055 kg). His temperature is 98 F (36.7 C). His blood pressure is 124/72 and his pulse is 78. His oxygen saturation is 97%.   Alert and oriented 72 year old white male appearing his stated age. Head and neck examination: Grossly  unremarkable. Nodes: Without palpable cervical or supraclavicular lymphadenopathy. Chest: Lungs clear. Heart: Regular rate rhythm. Back: Without spinal or CVA discomfort on palpation. Abdomen: Without masses organomegaly. Genitalia: Unremarkable to inspection. Rectal: The prostate gland is slightly enlarged and is without focal induration or nodularity. Extremities: Trace ankle edema. Neurologic examination: Grossly nonfocal.  LABORATORY DATA:  Lab Results  Component Value Date   WBC 10.4 06/29/2012   HGB 12.3* 06/29/2012   HCT 43.7 06/29/2012   MCV 71.8* 06/29/2012   PLT 204 06/29/2012   Lab Results  Component Value Date   NA 138 06/30/2012   K 4.1 06/30/2012   CL 103 06/30/2012   CO2 25 06/30/2012   Lab Results  Component Value Date   ALT 34 06/26/2012   AST 30 06/26/2012   ALKPHOS 92 06/26/2012   BILITOT 0.8 06/26/2012   PSA 6.37 (uncorrected) from 08/01/2012.   IMPRESSION: Stage TI C. intermediate risk adenocarcinoma prostate. I explained to the patient that his prognosis is related to his stage, PSA level, and Gleason score. His stage is favorable, his Gleason score is of intermediate favorability. His corrected PSA is just over 10, but based on his prostate volume I would consider his PSA to still be in the borderline favorable range. Altogether I feel that he has intermediate risk disease. We discussed treatment options including surgery versus close surveillance versus radiation therapy. Radiation therapy options include seed implantation with or without 5 weeks of external beam radiation therapy or 8 weeks of external beam/IMRT. Based on his relatively low disease volume and isolated Gleason 7, then he would be a candidate for seed implantation alone and not require 5 weeks of external beam. We discussed the potential acute and late toxicities of radiation therapy. Seed implantation may be to more acute urinary toxicity but based on his I PSS score of 11., Think this would certainly be  tolerable. He states that he would have to stop his Coumadin for his seed implant. After a lengthy discussion he is most interested in seed implantation. He would like to proceed with a CT arch study this morning and he is scheduled for seed implantation with Dr. Retta Diones. Consent is signed today.   PLAN: As discussed above.  I spent 60 minutes minutes face to face with the patient and more than 50% of that time was spent in counseling and/or coordination of care.

## 2012-11-26 NOTE — Progress Notes (Signed)
GU Location of Tumor / Histology: Prostate Cancer  If Prostate Cancer, Gleason 3+3=6 and 3+4 =7 PSA 6.37 on 08/01/2012  Patient has mild to moderate ED but no treatment for this.  Biopsies of prostate 08/2009 and 10/09/12 Prostate volume 49 cc's  Past/Anticipated interventions by urology, if ZOX:[id number] prescribed finasteride.Urologist believes patient good candidate for brachytherapy.  Past/Anticipated interventions by medical oncology, if any: none  Weight changes, if any: NO  Bowel/Bladder complaints, if any: NO  Nausea/Vomiting, if any: No  Pain issues, if any:  Not currently but does have flare-up of gout at times.  SAFETY ISSUES:  Prior radiation? No  Pacemaker/ICD? No  Possible current pregnancy? No  Is the patient on methotrexate? No  Current Complaints / other details:Concerned about taking care of prostate cancer so that there is no problem in the future.

## 2012-11-26 NOTE — Addendum Note (Signed)
Encounter addended by: Tessa Lerner, RN on: 11/26/2012  1:42 PM<BR>     Documentation filed: Inpatient Patient Education

## 2012-11-26 NOTE — Progress Notes (Signed)
Please see the Nurse Progress Note in the MD Initial Consult Encounter for this patient. 

## 2012-11-26 NOTE — Progress Notes (Addendum)
Complex simulation/treatment planning note: The patient was taken to the CT simulator. He was placed supine. His pelvis was scanned. The CT data set was sent to the treatment planning system. I contoured his prostate and his prostate volume was found to be 54.7 cc. Prostate length is 4.8 cm. Prostate volume was projected over the pubic arch in the arch is open. He is a candidate for seed implantation. I prescribing 14,500 cGy utilizing I-125 seeds. He is to be implanted with [employer] system.

## 2012-11-26 NOTE — Addendum Note (Signed)
Encounter addended by: Maryln Gottron, MD on: 11/26/2012 11:50 AM<BR>     Documentation filed: Notes Section

## 2012-11-27 NOTE — Addendum Note (Signed)
Encounter addended by: Jarrette Dehner Marie Anabella Capshaw, RN on: 11/27/2012  8:42 AM<BR>     Documentation filed: Charges VN

## 2012-11-29 ENCOUNTER — Other Ambulatory Visit: Payer: Self-pay | Admitting: Urology

## 2012-11-29 ENCOUNTER — Telehealth: Payer: Self-pay | Admitting: *Deleted

## 2012-11-29 NOTE — Telephone Encounter (Signed)
CALLED PATIENT TO INFORM OF IMPLANT DATE, LVM FOR A RETURN CALL 

## 2012-12-02 ENCOUNTER — Other Ambulatory Visit: Payer: Self-pay | Admitting: Urology

## 2012-12-03 ENCOUNTER — Other Ambulatory Visit: Payer: Self-pay

## 2012-12-03 ENCOUNTER — Ambulatory Visit (HOSPITAL_BASED_OUTPATIENT_CLINIC_OR_DEPARTMENT_OTHER)
Admission: RE | Admit: 2012-12-03 | Discharge: 2012-12-03 | Disposition: A | Discharge status: Home / Self Care | Payer: Medicare Other | Source: Ambulatory Visit | Attending: Urology | Admitting: Urology

## 2012-12-03 ENCOUNTER — Encounter (HOSPITAL_BASED_OUTPATIENT_CLINIC_OR_DEPARTMENT_OTHER)
Admission: RE | Admit: 2012-12-03 | Discharge: 2012-12-03 | Disposition: A | Discharge status: Home / Self Care | Payer: Medicare Other | Source: Ambulatory Visit | Attending: Urology | Admitting: Urology

## 2012-12-03 DIAGNOSIS — M5380 Other specified dorsopathies, site unspecified: Secondary | ICD-10-CM | POA: Insufficient documentation

## 2012-12-03 DIAGNOSIS — C61 Malignant neoplasm of prostate: Secondary | ICD-10-CM | POA: Insufficient documentation

## 2012-12-03 DIAGNOSIS — Z01818 Encounter for other preprocedural examination: Secondary | ICD-10-CM | POA: Insufficient documentation

## 2012-12-03 DIAGNOSIS — I700 Atherosclerosis of aorta: Secondary | ICD-10-CM | POA: Insufficient documentation

## 2012-12-03 DIAGNOSIS — Z954 Presence of other heart-valve replacement: Secondary | ICD-10-CM | POA: Insufficient documentation

## 2013-01-01 ENCOUNTER — Encounter (HOSPITAL_BASED_OUTPATIENT_CLINIC_OR_DEPARTMENT_OTHER): Payer: Self-pay | Admitting: *Deleted

## 2013-01-02 ENCOUNTER — Encounter (HOSPITAL_BASED_OUTPATIENT_CLINIC_OR_DEPARTMENT_OTHER): Payer: Self-pay | Admitting: *Deleted

## 2013-01-06 ENCOUNTER — Encounter (HOSPITAL_BASED_OUTPATIENT_CLINIC_OR_DEPARTMENT_OTHER): Payer: Self-pay | Admitting: *Deleted

## 2013-01-06 LAB — COMPREHENSIVE METABOLIC PANEL
Albumin: 4 g/dL (ref 3.5–5.2)
Alkaline Phosphatase: 106 U/L (ref 39–117)
BUN: 19 mg/dL (ref 6–23)
CO2: 26 mEq/L (ref 19–32)
Chloride: 101 mEq/L (ref 96–112)
Glucose, Bld: 129 mg/dL — ABNORMAL HIGH (ref 70–99)
Potassium: 4.2 mEq/L (ref 3.5–5.1)
Total Bilirubin: 1.1 mg/dL (ref 0.3–1.2)

## 2013-01-06 LAB — CBC
HCT: 39.4 % (ref 39.0–52.0)
Hemoglobin: 13 g/dL (ref 13.0–17.0)
RBC: 6.57 MIL/uL — ABNORMAL HIGH (ref 4.22–5.81)
RDW: 16.3 % — ABNORMAL HIGH (ref 11.5–15.5)
WBC: 9.9 10*3/uL (ref 4.0–10.5)

## 2013-01-06 LAB — PROTIME-INR: Prothrombin Time: 18.5 seconds — ABNORMAL HIGH (ref 11.6–15.2)

## 2013-01-06 LAB — APTT: aPTT: 41 seconds — ABNORMAL HIGH (ref 24–37)

## 2013-01-06 NOTE — Progress Notes (Signed)
NPO AFTER MN. ARRIVE AT 1045. CURRENT LAB WORK DONE TODAY. CURRENT EKG AND CXR IN EPIC AND CHART. WILL DO FLEET ENEMA AM OF SURG.

## 2013-01-08 ENCOUNTER — Telehealth: Payer: Self-pay | Admitting: *Deleted

## 2013-01-08 NOTE — H&P (Signed)
Urology History and Physical Exam  CC: Prostate cancer  HPI: 72 year old male presents for brachytherapy. His history is as follows:   His initial biopsy was in February 2011. Because of elevated PSA  (6.37 while on finasteride) as well as a high PCA 3 test, he underwent repeat ultrasound and biopsy of his prostate on 10/09/2012. At that time, prostatic volume was 49 cc. 3/12 biopsies came back adenocarcinoma as follows:  Right apex medial, Gleason 3+3, less than 5% of core Left apex medial, Gleason 3+3, 20% of core left apex lateral, Gleason 3+4, 40% of core.  He underwent consultation, and has chosen to have brachytherapy.   PMH: Past Medical History  Diagnosis Date  . Complication of anesthesia     "wake up w/a start; hallucinations" (06/26/2012)  . Hypertension   . History of kidney stones   . Gouty arthritis     "have had it in both feet, ankles, right knee" (06/26/2012)  . Arthritis     "probably in my thumbs" (06/26/2012)  . Anticoagulant long-term use   . History of aortic stenosis     CRITICAL  2004  S/P AVR  . S/P AVR (aortic valve replacement)     2004  . Diverticulosis   . History of diverticulitis of colon   . History of pancreatitis   . Incomplete RBBB   . Bifascicular block   . History of atrial fibrillation     PSH: Past Surgical History  Procedure Laterality Date  . Aortic valve replacement  04-14-2003  DR GERHARDT    #66mm [facility] JUDE MECHNICAL PROSTHESIS  . Hemicolectomy  10/06/2003    Laparoscopic-assisted left hemicolectomy for diverticulitis  . Prostate biopsy  10/09/2012    gleason 3+4=7  . Laparoscopic cholecystectomy  07-23-2006  . Cataract extraction w/ intraocular lens  implant, bilateral  RIGHT 2010/  LEFT DEC 2013  . Transthoracic echocardiogram  04-28-2008    MILD LVH / NORMAL LSF/ NORMAL AORTIC VALVE MECHANICAL PROSTHESIS FUNCTION/ MILD LAE/ EF 55-60%    Allergies: Allergies  Allergen Reactions  . Celecoxib Rash    Celebrex     Medications: No prescriptions prior to admission     Social History: History   Social History  . Marital Status: Married    Spouse Name: N/A    Number of Children: 4  . 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 Smoking status: Never Smoker   . Smokeless tobacco: Never Used  . Alcohol Use: 1.8 oz/week    3 Cans of beer per week     Comment:  "may have 2-3 beers on the weekend"  . Drug Use: No  . Sexually Active: Not on file   Other Topics Concern  . Not on file   Social History Narrative  . No narrative on file    Family History: Family History  Problem Relation Age of Onset  . Heart failure Mother   . Diabetes Mother   . Prostate cancer Father     Review of Systems: Positive:  Negative:   A further 10 point review of systems was negative except what is listed in the HPI.  Physical Exam: @VITALS2 @ General: No acute distress.  Awake. Head:  Normocephalic.  Atraumatic. ENT:  EOMI.  Mucous membranes moist Neck:  Supple.  No lymphadenopathy. CV:  S1 present. S2 present. Regular rate. Pulmonary: Equal effort bilaterally.  Clear to auscultation  bilaterally. Abdomen: Soft.  Non tender to palpation.Mildly obese. Skin:  Normal turgor.  No visible rash. Extremity: No gross deformity of bilateral upper extremities.  No gross deformity of    bilateral lower extremities. Neurologic: Alert. Appropriate mood.   Studies:  Recent Labs     01/06/13  0857  HGB  13.0  WBC  9.9  PLT  174    Recent Labs     01/06/13  0857  NA  137  K  4.2  CL  101  CO2  26  BUN  19  CREATININE  1.05  CALCIUM  9.3  GFRNONAA  69*  GFRAA  80*     Recent Labs     01/06/13  0857  INR  1.59*  APTT  41*     No components found with this basename: ABG,     Assessment:  Adenocarcinoma of the prostate. Clinical stage TI C.-3/12 biopsies positive for adenocarcinoma, 2 with small volume Gleason 3 +3  disease, one with Gleason 3+4, all at the apex. Prostatic volume was just under 50 cc.   Plan: I-125 brachytherapy

## 2013-01-08 NOTE — Telephone Encounter (Signed)
CALLED PATIENT TO REMIND OF IMPLANT ON 01-09-13, LVM FOR  A RETURN CALL

## 2013-01-09 ENCOUNTER — Ambulatory Visit (HOSPITAL_BASED_OUTPATIENT_CLINIC_OR_DEPARTMENT_OTHER): Payer: Medicare Other | Admitting: Anesthesiology

## 2013-01-09 ENCOUNTER — Ambulatory Visit (HOSPITAL_BASED_OUTPATIENT_CLINIC_OR_DEPARTMENT_OTHER)
Admission: RE | Admit: 2013-01-09 | Discharge: 2013-01-09 | Disposition: A | Discharge status: Home / Self Care | Payer: Medicare Other | Source: Ambulatory Visit | Attending: Urology | Admitting: Urology

## 2013-01-09 ENCOUNTER — Ambulatory Visit (HOSPITAL_COMMUNITY): Payer: Medicare Other

## 2013-01-09 ENCOUNTER — Encounter: Payer: Self-pay | Admitting: Radiation Oncology

## 2013-01-09 ENCOUNTER — Encounter (HOSPITAL_BASED_OUTPATIENT_CLINIC_OR_DEPARTMENT_OTHER): Payer: Self-pay | Admitting: *Deleted

## 2013-01-09 ENCOUNTER — Encounter (HOSPITAL_BASED_OUTPATIENT_CLINIC_OR_DEPARTMENT_OTHER)
Admission: RE | Disposition: A | Discharge status: Home / Self Care | Payer: Self-pay | Source: Ambulatory Visit | Attending: Urology

## 2013-01-09 ENCOUNTER — Encounter (HOSPITAL_BASED_OUTPATIENT_CLINIC_OR_DEPARTMENT_OTHER): Payer: Self-pay | Admitting: Anesthesiology

## 2013-01-09 DIAGNOSIS — I10 Essential (primary) hypertension: Secondary | ICD-10-CM | POA: Insufficient documentation

## 2013-01-09 DIAGNOSIS — C61 Malignant neoplasm of prostate: Secondary | ICD-10-CM

## 2013-01-09 DIAGNOSIS — Z7901 Long term (current) use of anticoagulants: Secondary | ICD-10-CM | POA: Insufficient documentation

## 2013-01-09 HISTORY — DX: Unspecified right bundle-branch block: I45.10

## 2013-01-09 HISTORY — DX: Long term (current) use of anticoagulants: Z79.01

## 2013-01-09 HISTORY — DX: Diverticulosis of intestine, part unspecified, without perforation or abscess without bleeding: K57.90

## 2013-01-09 HISTORY — DX: Personal history of other diseases of the digestive system: Z87.19

## 2013-01-09 HISTORY — DX: Bifascicular block: I45.2

## 2013-01-09 HISTORY — DX: Personal history of other diseases of the circulatory system: Z86.79

## 2013-01-09 HISTORY — PX: RADIOACTIVE SEED IMPLANT: SHX5150

## 2013-01-09 SURGERY — INSERTION, RADIATION SOURCE, PROSTATE
Anesthesia: General | Site: Prostate | Wound class: Clean

## 2013-01-09 MED ORDER — FENTANYL CITRATE 0.05 MG/ML IJ SOLN
INTRAMUSCULAR | Status: DC | PRN
  Administered 2013-01-09 (×6): 25 ug via INTRAVENOUS
  Administered 2013-01-09: 50 ug via INTRAVENOUS

## 2013-01-09 MED ORDER — SODIUM CHLORIDE 0.9 % IV SOLN
250.0000 mL | INTRAVENOUS | Status: DC | PRN
  Filled 2013-01-09: qty 250

## 2013-01-09 MED ORDER — IOHEXOL 350 MG/ML SOLN
INTRAVENOUS | Status: DC | PRN
  Administered 2013-01-09: 7 mL

## 2013-01-09 MED ORDER — ONDANSETRON HCL 4 MG/2ML IJ SOLN
4.0000 mg | Freq: Four times a day (QID) | INTRAMUSCULAR | Status: DC | PRN
  Filled 2013-01-09: qty 2

## 2013-01-09 MED ORDER — CIPROFLOXACIN IN D5W 400 MG/200ML IV SOLN
400.0000 mg | INTRAVENOUS | Status: AC
  Administered 2013-01-09: 400 mg via INTRAVENOUS
  Filled 2013-01-09: qty 200

## 2013-01-09 MED ORDER — ACETAMINOPHEN 325 MG PO TABS
650.0000 mg | ORAL_TABLET | ORAL | Status: DC | PRN
  Filled 2013-01-09: qty 2

## 2013-01-09 MED ORDER — LIDOCAINE HCL (CARDIAC) 20 MG/ML IV SOLN
INTRAVENOUS | Status: DC | PRN
  Administered 2013-01-09: 50 mg via INTRAVENOUS

## 2013-01-09 MED ORDER — CIPROFLOXACIN HCL 250 MG PO TABS: 250.0000 mg | ORAL_TABLET | Freq: Two times a day (BID) | ORAL | Status: DC

## 2013-01-09 MED ORDER — OXYCODONE HCL 5 MG PO TABS
5.0000 mg | ORAL_TABLET | ORAL | Status: DC | PRN
  Filled 2013-01-09: qty 2

## 2013-01-09 MED ORDER — HYDROCODONE-ACETAMINOPHEN 5-325 MG PO TABS: 1.0000 | ORAL_TABLET | ORAL | Status: DC | PRN

## 2013-01-09 MED ORDER — ACETAMINOPHEN 650 MG RE SUPP
650.0000 mg | RECTAL | Status: DC | PRN
  Filled 2013-01-09: qty 1

## 2013-01-09 MED ORDER — ONDANSETRON HCL 4 MG/2ML IJ SOLN
INTRAMUSCULAR | Status: DC | PRN
  Administered 2013-01-09: 4 mg via INTRAVENOUS

## 2013-01-09 MED ORDER — SODIUM CHLORIDE 0.9 % IJ SOLN
3.0000 mL | INTRAMUSCULAR | Status: DC | PRN
  Filled 2013-01-09: qty 3

## 2013-01-09 MED ORDER — MIDAZOLAM HCL 5 MG/5ML IJ SOLN
INTRAMUSCULAR | Status: DC | PRN
  Administered 2013-01-09: 1 mg via INTRAVENOUS

## 2013-01-09 MED ORDER — FENTANYL CITRATE 0.05 MG/ML IJ SOLN
25.0000 ug | INTRAMUSCULAR | Status: DC | PRN
  Filled 2013-01-09: qty 1

## 2013-01-09 MED ORDER — DEXAMETHASONE SODIUM PHOSPHATE 4 MG/ML IJ SOLN
INTRAMUSCULAR | Status: DC | PRN
  Administered 2013-01-09: 8 mg via INTRAVENOUS

## 2013-01-09 MED ORDER — EPHEDRINE SULFATE 50 MG/ML IJ SOLN
INTRAMUSCULAR | Status: DC | PRN
  Administered 2013-01-09: 5 mg via INTRAVENOUS
  Administered 2013-01-09: 10 mg via INTRAVENOUS

## 2013-01-09 MED ORDER — PROPOFOL 10 MG/ML IV BOLUS
INTRAVENOUS | Status: DC | PRN
  Administered 2013-01-09: 160 mg via INTRAVENOUS

## 2013-01-09 MED ORDER — STERILE WATER FOR IRRIGATION IR SOLN
Status: DC | PRN
  Administered 2013-01-09: 3000 mL

## 2013-01-09 MED ORDER — LACTATED RINGERS IV SOLN
INTRAVENOUS | Status: DC
  Administered 2013-01-09: 100 mL/h via INTRAVENOUS
  Administered 2013-01-09: 14:00:00 via INTRAVENOUS
  Filled 2013-01-09: qty 1000

## 2013-01-09 MED ORDER — FLEET ENEMA 7-19 GM/118ML RE ENEM
1.0000 | ENEMA | Freq: Once | RECTAL | Status: DC
  Filled 2013-01-09: qty 1

## 2013-01-09 MED ORDER — LACTATED RINGERS IV SOLN
INTRAVENOUS | Status: DC
  Filled 2013-01-09: qty 1000

## 2013-01-09 MED ORDER — SODIUM CHLORIDE 0.9 % IJ SOLN
3.0000 mL | Freq: Two times a day (BID) | INTRAMUSCULAR | Status: DC
  Filled 2013-01-09: qty 3

## 2013-01-09 SURGICAL SUPPLY — 25 items
BAG URINE DRAINAGE (UROLOGICAL SUPPLIES) ×2 IMPLANT
BLADE SURG ROTATE 9660 (MISCELLANEOUS) ×2 IMPLANT
CATH FOLEY 2WAY SLVR  5CC 16FR (CATHETERS) ×1
CATH FOLEY 2WAY SLVR 5CC 16FR (CATHETERS) ×1 IMPLANT
CATH ROBINSON RED A/P 20FR (CATHETERS) ×2 IMPLANT
CLOTH BEACON ORANGE TIMEOUT ST (SAFETY) ×2 IMPLANT
COVER MAYO STAND STRL (DRAPES) ×2 IMPLANT
COVER TABLE BACK 60X90 (DRAPES) ×2 IMPLANT
DRSG TEGADERM 4X4.75 (GAUZE/BANDAGES/DRESSINGS) ×2 IMPLANT
DRSG TEGADERM 8X12 (GAUZE/BANDAGES/DRESSINGS) ×2 IMPLANT
GAUZE SPONGE 4X4 12PLY STRL LF (GAUZE/BANDAGES/DRESSINGS) ×1 IMPLANT
GLOVE BIO SURGEON STRL SZ7.5 (GLOVE) ×5 IMPLANT
GLOVE BIO SURGEON STRL SZ8 (GLOVE) ×4 IMPLANT
GLOVE BIOGEL M 7.0 STRL (GLOVE) ×2 IMPLANT
GLOVE ECLIPSE 8.0 STRL XLNG CF (GLOVE) IMPLANT
GLOVE INDICATOR 7.0 STRL GRN (GLOVE) ×1 IMPLANT
GOWN PREVENTION PLUS LG XLONG (DISPOSABLE) ×3 IMPLANT
GOWN STRL REIN XL XLG (GOWN DISPOSABLE) ×2 IMPLANT
HOLDER FOLEY CATH W/STRAP (MISCELLANEOUS) ×2 IMPLANT
PACK CYSTOSCOPY (CUSTOM PROCEDURE TRAY) ×2 IMPLANT
SYRINGE 10CC LL (SYRINGE) ×2 IMPLANT
UNDERPAD 30X30 INCONTINENT (UNDERPADS AND DIAPERS) ×4 IMPLANT
WATER STERILE IRR 3000ML UROMA (IV SOLUTION) ×2 IMPLANT
WATER STERILE IRR 500ML POUR (IV SOLUTION) ×2 IMPLANT
brachy seeds ×66 IMPLANT

## 2013-01-09 NOTE — Anesthesia Preprocedure Evaluation (Signed)
Anesthesia Evaluation  Patient identified by MRN, date of birth, ID band Patient awake    Reviewed: Allergy & Precautions, H&P , NPO status , Patient's Chart, lab work & pertinent test results  History of Anesthesia Complications (+) Emergence Delirium  Airway Mallampati: II TM Distance: >3 FB Neck ROM: full    Dental  (+) Caps and Dental Advisory Given 2 upper front teeth capped:   Pulmonary neg pulmonary ROS,  breath sounds clear to auscultation  Pulmonary exam normal       Cardiovascular Exercise Tolerance: Good hypertension, Pt. on medications + dysrhythmias Atrial Fibrillation Rhythm:regular Rate:Normal  History AVR.  ECG NSR   Neuro/Psych negative neurological ROS  negative psych ROS   GI/Hepatic negative GI ROS, Neg liver ROS,   Endo/Other  negative endocrine ROS  Renal/GU negative Renal ROS  negative genitourinary   Musculoskeletal   Abdominal   Peds  Hematology negative hematology ROS (+)   Anesthesia Other Findings   Reproductive/Obstetrics negative OB ROS                           Anesthesia Physical Anesthesia Plan  ASA: III  Anesthesia Plan: General   Post-op Pain Management:    Induction: Intravenous  Airway Management Planned: LMA  Additional Equipment:   Intra-op Plan:   Post-operative Plan:   Informed Consent: I have reviewed the patients History and Physical, chart, labs and discussed the procedure including the risks, benefits and alternatives for the proposed anesthesia with the patient or authorized representative who has indicated his/her understanding and acceptance.   Dental Advisory Given  Plan Discussed with: CRNA and Surgeon  Anesthesia Plan Comments:         Anesthesia Quick Evaluation

## 2013-01-09 NOTE — Op Note (Signed)
Preoperative diagnosis: Clinical stage TI C adenocarcinoma the prostate   Postoperative diagnosis: Same   Procedure: I-125 prostate seed implantation with Nucletron robotic implanter   Surgeon: Bertram Millard. Deejay Koppelman M.D.  Radiation Oncologist: Chipper Herb, MD  Anesthesia: Gen.   Indications: Patient  was diagnosed with clinical stage TI C prostate cancer. We had extensive discussion with him about treatment options versus. He elected to proceed with seed implantation. He underwent consultation my office as well as with Dr. Dayton Scrape. He appeared to understand the advantages disadvantages potential risks of this treatment option. Full informed consent has been obtained.   Technique and findings: Patient was brought the operating room where he had successful induction of general anesthesia. He was placed in dorso-lithotomy position and prepped and draped in usual manner. Appropriate surgical timeout was performed. Radiation oncology department placed a transrectal ultrasound probe anchoring stand. Foley catheter with contrast in the balloon was inserted without difficulty. Anchoring needles were placed within the prostate. Rectal tube was placed. Real-time contouring of the urethra prostate and rectum were performed and the dosing parameters were established. Targeted dose was 145 gray.  I was then called  to the operating suite suite for placement of the needles. A second timeout was performed. All needle passage was done with real-time transrectal ultrasound guidance with the sagittal plane. A total of 25 needles were placed. The implantation itself was done with the robotic implanter. 66 active seeds were implanted. A Foley catheter was removed and flexible cystoscopy failed to show any seeds outside the prostate.  The patient was brought to recovery room in stable condition.

## 2013-01-09 NOTE — Progress Notes (Addendum)
[facility] Radiation Oncology Brachytherapy Operative Procedure Note  Name: VIRGILIO BROADHEAD MRN: [id number]  Date:   11/29/2012           DOB: 1940/10/06  Status:outpatient    WU:[id number],YNWGN Juel Burrow, MD  Dr. Marcine Matar DIAGNOSIS: A 72 year old gentlemen with stage T1 C. adenocarcinoma of the prostate with a Gleason of 7 and a PSA of 6.37.  PROCEDURE: Insertion of radioactive I-125 seeds into the prostate gland.  RADIATION DOSE: 145 Gy, definitive therapy.  TECHNIQUE: JAYRON MAQUEDA was brought to the operating room with Dr. Retta Diones. He was placed in the dorsolithotomy position. He was catheterized and a rectal tube was inserted. The perineum was shaved, prepped and draped. The ultrasound probe was then introduced into the rectum to see the prostate gland.  TREATMENT DEVICE: A needle grid was attached to the ultrasound probe stand and anchor needles were placed.  COMPLEX ISODOSE CALCULATION: The prostate was imaged in 3D using a sagittal sweep of the prostate probe. These images were transferred to the planning computer. There, the prostate, urethra and rectum were defined on each axial reconstructed image. Then, the software created an optimized plan and a few seed positions were adjusted. Then the accepted plan was uploaded to the seed Selectron afterloading unit.  SPECIAL TREATMENT PROCEDURE/SUPERVISION AND HANDLING: The Nucletron FIRST system was used to place the needles under sagittal guidance. A total of 24 needles were used to deposit 66 seeds in the prostate gland. The individual seed activity was 0.55 mCi for a total implant activity of 36.0 mCi.  COMPLEX SIMULATION: At the end of the procedure, an anterior radiograph of the pelvis was obtained to document seed positioning and count. Cystoscopy was performed to check the urethra and bladder.  MICRODOSIMETRY: At the end of the procedure, the patient was emitting 0.06 mrem/hr at 1 meter. Accordingly, he was  considered safe for hospital discharge.  PLAN: The patient will return to the radiation oncology clinic for post implant CT dosimetry in three weeks.

## 2013-01-09 NOTE — Transfer of Care (Signed)
Immediate Anesthesia Transfer of Care Note  Patient: Adrian Lambert  Procedure(s) Performed: Procedure(s) (LRB): RADIOACTIVE SEED IMPLANT (N/A)  Patient Location: PACU  Anesthesia Type: General  Level of Consciousness: awake, oriented, sedated and patient cooperative  Airway & Oxygen Therapy: Patient Spontanous Breathing and Patient connected to face mask oxygen  Post-op Assessment: Report given to PACU RN and Post -op Vital signs reviewed and stable  Post vital signs: Reviewed and stable  Complications: No apparent anesthesia complications

## 2013-01-09 NOTE — Interval H&P Note (Signed)
Pt ready for procedure

## 2013-01-09 NOTE — Progress Notes (Signed)
[facility] Radiation Oncology End of Treatment Note  Name:Adrian Lambert  Date: 01/09/2013 WGN:[id number] DOB:12-Aug-1940   Status:outpatient    CC: Mickie Hillier, MD  Dr. Marcine Matar  REFERRING PHYSICIAN: Dr. Marcine Matar    DIAGNOSIS: Stage TI C. intermediate risk adenocarcinoma prostate   INDICATION FOR TREATMENT: Curative   TREATMENT DATES: Implant date 01/10/2039                          SITE/DOSE:  Prostate, 14,500 cGy, isotope I-125 utilizing 66 seeds and 24 active needles. Individual seed activity 0.55 mCi per seed for a total implant activity of 36.0 mCi                                      NARRATIVE: The patient appears to undergone a successful Nucletron seed Selectron implant with Dr. Retta Diones.                         PLAN: Routine followup in 3 weeks.  We'll obtain a CT scan at that time for his post implant dosimetry.   Patient instructed to call if questions or worsening complaints in interim.

## 2013-01-09 NOTE — Anesthesia Procedure Notes (Signed)
Procedure Name: LMA Insertion Date/Time: 01/09/2013 12:25 PM Performed by: Renella Cunas D Pre-anesthesia Checklist: Patient identified, Emergency Drugs available, Suction available and Patient being monitored Patient Re-evaluated:Patient Re-evaluated prior to inductionOxygen Delivery Method: Circle System Utilized Preoxygenation: Pre-oxygenation with 100% oxygen Intubation Type: IV induction Ventilation: Mask ventilation without difficulty LMA: LMA inserted LMA Size: 4.0 Number of attempts: 1 Airway Equipment and Method: bite block Placement Confirmation: positive ETCO2 Tube secured with: Tape Dental Injury: Teeth and Oropharynx as per pre-operative assessment

## 2013-01-10 NOTE — Anesthesia Postprocedure Evaluation (Signed)
  Anesthesia Post-op Note  Patient: Adrian Lambert  Procedure(s) Performed: Procedure(s) (LRB): RADIOACTIVE SEED IMPLANT (N/A)  Patient Location: PACU  Anesthesia Type: General  Level of Consciousness: awake and alert   Airway and Oxygen Therapy: Patient Spontanous Breathing  Post-op Pain: mild  Post-op Assessment: Post-op Vital signs reviewed, Patient's Cardiovascular Status Stable, Respiratory Function Stable, Patent Airway and No signs of Nausea or vomiting  Last Vitals:  Filed Vitals:   01/09/13 1600  BP: 131/76  Pulse: 80  Temp: 36.1 C  Resp: 18    Post-op Vital Signs: stable   Complications: No apparent anesthesia complications

## 2013-01-13 ENCOUNTER — Encounter (HOSPITAL_BASED_OUTPATIENT_CLINIC_OR_DEPARTMENT_OTHER): Payer: Self-pay | Admitting: Urology

## 2013-01-23 ENCOUNTER — Telehealth: Payer: Self-pay | Admitting: *Deleted

## 2013-01-23 NOTE — Telephone Encounter (Signed)
CALLED PATIENT TO REMIND OF APPTS. FOR 01-27-13, LVM FOR A RETURN CALL

## 2013-01-27 ENCOUNTER — Ambulatory Visit
Admission: RE | Admit: 2013-01-27 | Discharge: 2013-01-27 | Disposition: A | Discharge status: Home / Self Care | Payer: Medicare Other | Source: Ambulatory Visit | Attending: Radiation Oncology | Admitting: Radiation Oncology

## 2013-01-27 ENCOUNTER — Encounter: Payer: Self-pay | Admitting: Radiation Oncology

## 2013-01-27 VITALS — BP 109/77 | HR 84 | Temp 98.3°F | Resp 18 | Wt 227.7 lb

## 2013-01-27 DIAGNOSIS — C61 Malignant neoplasm of prostate: Secondary | ICD-10-CM

## 2013-01-27 NOTE — Progress Notes (Signed)
CC: Adrian Lambert, Adrian Lambert Rectal examination not performed today Followup note:  Adrian Lambert returns today approximately 3 weeks following his prostate seed implant in the management of his stage TI C. intermediate risk adenocarcinoma prostate. He is been having difficulty with his force of stream and the sensation of incomplete emptying of his bladder. No dysuria. He does have mild constipation for which she uses a stool softener.  His CT scan today shows what appears to be an excellent seed distribution. The apex is well covered.  Physical examination: Alert and oriented. Filed Vitals:   01/27/13 1125  BP: 109/77  Pulse: 84  Temp: 98.3 F (36.8 C)  Resp: 18   Rectal examination not performed.  Impression: Satisfactory progress with the exception of mild to moderate outlet obstruction. I was going to prescribe tamsulosin, but there is apparently a contraindication in patients with an allergy to Celebrex. Therefore, I will have Adrian Lambert prescribed an alpha blocker when he sees him for a followup visit this Thursday.  Plan: Followup visit with Adrian Lambert this Thursday. We will move ahead with his post implant dosimetry and for the results of Adrian Lambert. I asked that Adrian Lambert keep me posted on his progress/PSA determinations.

## 2013-01-27 NOTE — Progress Notes (Signed)
Reports voiding every two hours during the night. Reports blood in stool and urine have resolved. Reports his urine stream starts and stops off. Reports weak urine stream. Reports difficulty completely emptying his bladder. Denies pain or burning associated with urination but, does report it "feels different." Reports mild constipation for which he is taking a stool softener.

## 2013-01-27 NOTE — Progress Notes (Signed)
Complex simulation note: The patient was taken to the CT simulator. His pelvis was scanned. The CT data set was sent to the [facility] system for contouring the prostate and rectum. He'll then undergo 3-D simulation to assess the quality of his implant.

## 2013-02-12 ENCOUNTER — Encounter: Payer: Self-pay | Admitting: Radiation Oncology

## 2013-02-12 NOTE — Progress Notes (Signed)
CC: Dr. Marcine Matar, Dr. Catha Gosselin   Post implant CT dosimetry/3-D simulation note: The patient underwent post-implant CT dosimetry/3-D simulation on 02/10/2013 to assess the quality of his implant. His intraoperative prostate volume ultrasound was 43.6 cc while his postoperative prostate volume by CT was 42.8 cc, a good correlation . Dose volume histograms were obtained for the prostate and rectum. His prostate D 90 was 105.3% and V100 93.0%, both excellent. Only 0.14 cc of rectum received the prescribed dose of 14,500 cGy. In summary, he has excellent post implant dosimetry with a low risk for late rectal toxicity.

## 2013-02-26 ENCOUNTER — Other Ambulatory Visit: Payer: Self-pay

## 2013-05-29 ENCOUNTER — Other Ambulatory Visit: Payer: Self-pay

## 2013-07-11 ENCOUNTER — Encounter: Payer: Self-pay | Admitting: *Deleted

## 2015-01-18 ENCOUNTER — Other Ambulatory Visit: Payer: Self-pay

## 2015-02-02 ENCOUNTER — Ambulatory Visit (INDEPENDENT_AMBULATORY_CARE_PROVIDER_SITE_OTHER): Payer: Medicare Other | Admitting: Cardiology

## 2015-02-02 ENCOUNTER — Encounter: Payer: Self-pay | Admitting: Cardiology

## 2015-02-02 VITALS — BP 122/80 | HR 83 | Ht 70.0 in | Wt 229.0 lb

## 2015-02-02 DIAGNOSIS — I452 Bifascicular block: Secondary | ICD-10-CM

## 2015-02-02 DIAGNOSIS — Z8679 Personal history of other diseases of the circulatory system: Secondary | ICD-10-CM

## 2015-02-02 DIAGNOSIS — I10 Essential (primary) hypertension: Secondary | ICD-10-CM

## 2015-02-02 DIAGNOSIS — Z952 Presence of prosthetic heart valve: Secondary | ICD-10-CM

## 2015-02-02 DIAGNOSIS — Z954 Presence of other heart-valve replacement: Secondary | ICD-10-CM | POA: Diagnosis not present

## 2015-02-02 NOTE — Patient Instructions (Signed)
Continue your current therapy  I will see you in one year   

## 2015-02-02 NOTE — Progress Notes (Signed)
Adrian Lambert Date of Birth: 1940/12/09 Medical Record #[id number]  History of Present Illness: [name] is seen for followup AVR. He has a history of severe aortic stenosis and is status post aortic valve replacement with a #25 mm St. Jude mechanical prosthesis September 2004. He is on chronic Coumadin therapy. He was last seen in 2012. He reports that he has done well. He denies any chest pain, shortness of breath, or palpitations. His Coumadin is followed by Dr. Rex Kras. He has been treated for prostate CA with radioactive seed implant. He does exercise regularly walking 1 mi/day.  Current Outpatient Prescriptions on File Prior to Visit  Medication Sig Dispense Refill  . allopurinol (ZYLOPRIM) 300 MG tablet Take 300 mg by mouth daily.     Marland Kitchen amLODipine-valsartan (EXFORGE) 5-160 MG per tablet Take 1 tablet by mouth daily.     . colchicine 0.6 MG tablet Take 0.6 mg by mouth as needed. As needed    . Psyllium (METAMUCIL PO) Take by mouth daily.    Marland Kitchen warfarin (COUMADIN) 5 MG tablet Take 5 mg by mouth daily.      No current facility-administered medications on file prior to visit.    Allergies  Allergen Reactions  . Celecoxib Rash    Celebrex    Past Medical History  Diagnosis Date  . Complication of anesthesia     "wake up w/a start; hallucinations" (06/26/2012)  . Hypertension   . History of kidney stones   . Gouty arthritis     "have had it in both feet, ankles, right knee" (06/26/2012)  . Arthritis     "probably in my thumbs" (06/26/2012)  . Anticoagulant long-term use   . History of aortic stenosis     CRITICAL  2004  S/P AVR  . S/P AVR (aortic valve replacement)     2004  . Diverticulosis   . History of diverticulitis of colon   . History of pancreatitis   . Incomplete RBBB   . Bifascicular block   . History of atrial fibrillation     Past Surgical History  Procedure Laterality Date  . Aortic valve replacement  04-14-2003  DR GERHARDT    #37mm ST JUDE  MECHNICAL PROSTHESIS  . Hemicolectomy  10/06/2003    Laparoscopic-assisted left hemicolectomy for diverticulitis  . Prostate biopsy  10/09/2012    gleason 3+4=7  . Laparoscopic cholecystectomy  07-23-2006  . Cataract extraction w/ intraocular lens  implant, bilateral  RIGHT 2010/  LEFT DEC 2013  . Transthoracic echocardiogram  04-28-2008    MILD LVH / NORMAL LSF/ NORMAL AORTIC VALVE MECHANICAL PROSTHESIS FUNCTION/ MILD LAE/ EF 55-60%  . Radioactive seed implant N/A 01/09/2013    Procedure: RADIOACTIVE SEED IMPLANT;  Surgeon: Franchot Gallo, MD;  Location: [facility];  Service: Urology;  Laterality: N/A;    History  Smoking status  . Never Smoker   Smokeless tobacco  . Never Used    History  Alcohol Use  . 1.8 oz/week  . 3 Cans of beer per week    Comment:  "may have 2-3 beers on the weekend"    Family History  Problem Relation Age of Onset  . Heart failure Mother   . Diabetes Mother   . Prostate cancer Father     Review of Systems: As noted in history of present illness.  All other systems were reviewed and are negative.  Physical Exam: BP 122/80 mmHg  Pulse 83  Ht 5\' 10"  (1.778 m)  Wt 103.874 kg (229 lb)  BMI 32.86 kg/m2 The patient is alert and oriented x 3.  The mood and affect are normal.  The skin is warm and dry.  Color is normal.  The HEENT exam reveals that the sclera are nonicteric.  The mucous membranes are moist.  The carotids are 2+ without bruits.  There is no thyromegaly.  There is no JVD.  The lungs are clear.  The chest wall is non tender.  The heart exam reveals a regular rate with a good mechanical aortic valve click.  There are no murmurs, gallops, or rubs.  The PMI is not displaced.   Abdominal exam reveals good bowel sounds.  There is no guarding or rebound.  There is no hepatosplenomegaly or tenderness.  There are no masses.  Exam of the legs reveal no clubbing, cyanosis, or edema.  The legs are without rashes.  The distal pulses are  intact.  Cranial nerves II - XII are intact.  Motor and sensory functions are intact.  The gait is normal.  LABORATORY DATA: ECG today demonstrates normal sinus rhythm. He has a left anterior fascicular block and an incomplete right bundle branch block. These findings are old. I have personally reviewed and interpreted this study.   Assessment / Plan: 1. History of severe AS s/p mechanical AVR in 2004. Normal valve function by exam. He is asymptomatic. Continue long term coumadin therapy. Recommendations for SBE prophylaxis reviewed.   2. HTN controlled.

## 2015-08-30 DIAGNOSIS — Z7901 Long term (current) use of anticoagulants: Secondary | ICD-10-CM | POA: Diagnosis not present

## 2015-09-15 DIAGNOSIS — D0362 Melanoma in situ of left upper limb, including shoulder: Secondary | ICD-10-CM | POA: Diagnosis not present

## 2015-09-15 DIAGNOSIS — Z86018 Personal history of other benign neoplasm: Secondary | ICD-10-CM | POA: Diagnosis not present

## 2015-09-15 DIAGNOSIS — L570 Actinic keratosis: Secondary | ICD-10-CM | POA: Diagnosis not present

## 2015-09-15 DIAGNOSIS — Z23 Encounter for immunization: Secondary | ICD-10-CM | POA: Diagnosis not present

## 2015-09-15 DIAGNOSIS — Z85828 Personal history of other malignant neoplasm of skin: Secondary | ICD-10-CM | POA: Diagnosis not present

## 2015-09-15 DIAGNOSIS — D485 Neoplasm of uncertain behavior of skin: Secondary | ICD-10-CM | POA: Diagnosis not present

## 2015-09-15 DIAGNOSIS — D225 Melanocytic nevi of trunk: Secondary | ICD-10-CM | POA: Diagnosis not present

## 2015-09-24 DIAGNOSIS — T814XXA Infection following a procedure, initial encounter: Secondary | ICD-10-CM | POA: Diagnosis not present

## 2015-10-01 DIAGNOSIS — L905 Scar conditions and fibrosis of skin: Secondary | ICD-10-CM | POA: Diagnosis not present

## 2015-10-01 DIAGNOSIS — D0359 Melanoma in situ of other part of trunk: Secondary | ICD-10-CM | POA: Diagnosis not present

## 2015-10-27 DIAGNOSIS — Z952 Presence of prosthetic heart valve: Secondary | ICD-10-CM | POA: Diagnosis not present

## 2015-10-27 DIAGNOSIS — Z7901 Long term (current) use of anticoagulants: Secondary | ICD-10-CM | POA: Diagnosis not present

## 2015-12-01 DIAGNOSIS — Z952 Presence of prosthetic heart valve: Secondary | ICD-10-CM | POA: Diagnosis not present

## 2015-12-01 DIAGNOSIS — E782 Mixed hyperlipidemia: Secondary | ICD-10-CM | POA: Diagnosis not present

## 2015-12-01 DIAGNOSIS — I10 Essential (primary) hypertension: Secondary | ICD-10-CM | POA: Diagnosis not present

## 2015-12-01 DIAGNOSIS — Z23 Encounter for immunization: Secondary | ICD-10-CM | POA: Diagnosis not present

## 2015-12-01 DIAGNOSIS — Z0000 Encounter for general adult medical examination without abnormal findings: Secondary | ICD-10-CM | POA: Diagnosis not present

## 2015-12-01 DIAGNOSIS — Z8546 Personal history of malignant neoplasm of prostate: Secondary | ICD-10-CM | POA: Diagnosis not present

## 2015-12-01 DIAGNOSIS — M109 Gout, unspecified: Secondary | ICD-10-CM | POA: Diagnosis not present

## 2015-12-01 DIAGNOSIS — R7301 Impaired fasting glucose: Secondary | ICD-10-CM | POA: Diagnosis not present

## 2015-12-29 DIAGNOSIS — Z952 Presence of prosthetic heart valve: Secondary | ICD-10-CM | POA: Diagnosis not present

## 2015-12-29 DIAGNOSIS — Z7901 Long term (current) use of anticoagulants: Secondary | ICD-10-CM | POA: Diagnosis not present

## 2016-01-12 DIAGNOSIS — C61 Malignant neoplasm of prostate: Secondary | ICD-10-CM | POA: Diagnosis not present

## 2016-01-19 DIAGNOSIS — N3281 Overactive bladder: Secondary | ICD-10-CM | POA: Diagnosis not present

## 2016-01-19 DIAGNOSIS — R3915 Urgency of urination: Secondary | ICD-10-CM | POA: Diagnosis not present

## 2016-01-19 DIAGNOSIS — N401 Enlarged prostate with lower urinary tract symptoms: Secondary | ICD-10-CM | POA: Diagnosis not present

## 2016-01-19 DIAGNOSIS — C61 Malignant neoplasm of prostate: Secondary | ICD-10-CM | POA: Diagnosis not present

## 2016-01-20 ENCOUNTER — Encounter: Payer: Self-pay | Admitting: Cardiology

## 2016-01-20 ENCOUNTER — Ambulatory Visit (INDEPENDENT_AMBULATORY_CARE_PROVIDER_SITE_OTHER): Payer: Medicare Other | Admitting: Cardiology

## 2016-01-20 VITALS — BP 120/82 | HR 78 | Ht 70.0 in | Wt 227.0 lb

## 2016-01-20 DIAGNOSIS — Z954 Presence of other heart-valve replacement: Secondary | ICD-10-CM

## 2016-01-20 DIAGNOSIS — I10 Essential (primary) hypertension: Secondary | ICD-10-CM | POA: Diagnosis not present

## 2016-01-20 DIAGNOSIS — Z8679 Personal history of other diseases of the circulatory system: Secondary | ICD-10-CM | POA: Diagnosis not present

## 2016-01-20 DIAGNOSIS — I452 Bifascicular block: Secondary | ICD-10-CM

## 2016-01-20 DIAGNOSIS — Z952 Presence of prosthetic heart valve: Secondary | ICD-10-CM

## 2016-01-20 NOTE — Patient Instructions (Signed)
We will schedule you for an Echocardiogram  Continue your current therapy   

## 2016-01-20 NOTE — Progress Notes (Signed)
Adrian Lambert Date of Birth: 1941/01/28 Medical Record [id number]  History of Present Illness: [name] is seen for followup AVR. He has a history of severe aortic stenosis and is status post aortic valve replacement with a #25 mm St. Jude mechanical prosthesis September 2004. Last Echo in 2009.  He is on chronic Coumadin therapy.  He reports that he is doing well. He denies any chest pain or palpitations. He does have some chronic SOB that he attributes to his weight. His Coumadin is followed by Dr. Rex Kras. He has been treated for prostate CA with radioactive seed implant. He reports his last PSA was excellent. He does exercise regularly walking 1 mi/day.  Current Outpatient Prescriptions on File Prior to Visit  Medication Sig Dispense Refill  . allopurinol (ZYLOPRIM) 300 MG tablet Take 300 mg by mouth daily.     Marland Kitchen amLODipine-valsartan (EXFORGE) 5-160 MG per tablet Take 1 tablet by mouth daily.     . colchicine 0.6 MG tablet Take 0.6 mg by mouth as needed. As needed    . Psyllium (METAMUCIL PO) Take by mouth daily.    . tamsulosin (FLOMAX) 0.4 MG CAPS capsule Take 0.4 mg by mouth daily.    Marland Kitchen tolterodine (DETROL LA) 4 MG 24 hr capsule Take 4 mg by mouth daily. Take 1 cap daily  0  . warfarin (COUMADIN) 5 MG tablet Take 5 mg by mouth daily.      No current facility-administered medications on file prior to visit.    Allergies  Allergen Reactions  . Celecoxib Rash    Celebrex    Past Medical History  Diagnosis Date  . Complication of anesthesia     "wake up w/a start; hallucinations" (06/26/2012)  . Hypertension   . History of kidney stones   . Gouty arthritis     "have had it in both feet, ankles, right knee" (06/26/2012)  . Arthritis     "probably in my thumbs" (06/26/2012)  . Anticoagulant long-term use   . History of aortic stenosis     CRITICAL  2004  S/P AVR  . S/P AVR (aortic valve replacement)     2004  . Diverticulosis   . History of diverticulitis of colon    . History of pancreatitis   . Incomplete RBBB   . Bifascicular block   . History of atrial fibrillation     Past Surgical History  Procedure Laterality Date  . Aortic valve replacement  04-14-2003  DR GERHARDT    #49mm ST JUDE MECHNICAL PROSTHESIS  . Hemicolectomy  10/06/2003    Laparoscopic-assisted left hemicolectomy for diverticulitis  . Prostate biopsy  10/09/2012    gleason 3+4=7  . Laparoscopic cholecystectomy  07-23-2006  . Cataract extraction w/ intraocular lens  implant, bilateral  RIGHT 2010/  LEFT DEC 2013  . Transthoracic echocardiogram  04-28-2008    MILD LVH / NORMAL LSF/ NORMAL AORTIC VALVE MECHANICAL PROSTHESIS FUNCTION/ MILD LAE/ EF 55-60%  . Radioactive seed implant N/A 01/09/2013    Procedure: RADIOACTIVE SEED IMPLANT;  Surgeon: Franchot Gallo, MD;  Location: [facility];  Service: Urology;  Laterality: N/A;    History  Smoking status  . Never Smoker   Smokeless tobacco  . Never Used    History  Alcohol Use  . 1.8 oz/week  . 3 Cans of beer per week    Comment:  "may have 2-3 beers on the weekend"    Family History  Problem Relation Age of Onset  .  Heart failure Mother   . Diabetes Mother   . Prostate cancer Father     Review of Systems: As noted in history of present illness.  All other systems were reviewed and are negative.  Physical Exam: BP 120/82 mmHg  Pulse 78  Ht 5\' 10"  (1.778 m)  Wt 227 lb (102.967 kg)  BMI 32.57 kg/m2 WD obese WM in NAD.   The HEENT exam is normal.   The carotids are 2+ without bruits.  There is no thyromegaly.  There is no JVD.  The lungs are clear.  The chest wall is non tender.  The heart exam reveals a regular rate with a good mechanical aortic valve click.  There are no murmurs, gallops, or rubs.  The PMI is not displaced.   Abdominal exam reveals good bowel sounds.  There is no guarding or rebound.  There is no hepatosplenomegaly or tenderness.  There are no masses.  Exam of the legs reveal no  clubbing, cyanosis, or edema.  The legs are without rashes.  The distal pulses are intact.  Cranial nerves II - XII are intact.  Motor and sensory functions are intact.  The gait is normal.  LABORATORY DATA: ECG today demonstrates normal sinus rhythm. He has a left anterior fascicular block and an incomplete right bundle branch block. These findings are unchanged from 2014. I have personally reviewed and interpreted this study.   Assessment / Plan: 1. History of severe AS s/p mechanical AVR in 2004. Normal valve function by exam. He does have some chronic dyspnea. Continue long term coumadin therapy. Recommendations for SBE prophylaxis reviewed. Will update Echo at this time.  2. HTN controlled.  Labs followed by primary care. Follow up in one year.

## 2016-01-20 NOTE — Addendum Note (Signed)
Addended by: Kathyrn Lass on: 01/20/2016 09:59 AM   Modules accepted: Orders

## 2016-02-08 ENCOUNTER — Other Ambulatory Visit: Payer: Self-pay

## 2016-02-08 ENCOUNTER — Other Ambulatory Visit (HOSPITAL_COMMUNITY): Payer: Self-pay | Admitting: *Deleted

## 2016-02-08 ENCOUNTER — Ambulatory Visit (HOSPITAL_COMMUNITY): Payer: Medicare Other | Attending: Internal Medicine

## 2016-02-08 DIAGNOSIS — I452 Bifascicular block: Secondary | ICD-10-CM | POA: Insufficient documentation

## 2016-02-08 DIAGNOSIS — I340 Nonrheumatic mitral (valve) insufficiency: Secondary | ICD-10-CM | POA: Insufficient documentation

## 2016-02-08 DIAGNOSIS — I351 Nonrheumatic aortic (valve) insufficiency: Secondary | ICD-10-CM | POA: Insufficient documentation

## 2016-02-08 DIAGNOSIS — Z952 Presence of prosthetic heart valve: Secondary | ICD-10-CM | POA: Insufficient documentation

## 2016-02-08 DIAGNOSIS — I119 Hypertensive heart disease without heart failure: Secondary | ICD-10-CM | POA: Diagnosis not present

## 2016-02-08 DIAGNOSIS — Z954 Presence of other heart-valve replacement: Secondary | ICD-10-CM

## 2016-02-08 DIAGNOSIS — I10 Essential (primary) hypertension: Secondary | ICD-10-CM | POA: Diagnosis not present

## 2016-02-08 DIAGNOSIS — Z8679 Personal history of other diseases of the circulatory system: Secondary | ICD-10-CM | POA: Insufficient documentation

## 2016-02-08 DIAGNOSIS — I77810 Thoracic aortic ectasia: Secondary | ICD-10-CM | POA: Diagnosis not present

## 2016-02-08 MED ORDER — PERFLUTREN LIPID MICROSPHERE
1.0000 mL | INTRAVENOUS | Status: AC | PRN
  Administered 2016-02-08: 2 mL via INTRAVENOUS

## 2016-03-01 DIAGNOSIS — Z952 Presence of prosthetic heart valve: Secondary | ICD-10-CM | POA: Diagnosis not present

## 2016-03-01 DIAGNOSIS — Z7901 Long term (current) use of anticoagulants: Secondary | ICD-10-CM | POA: Diagnosis not present

## 2016-04-03 DIAGNOSIS — M67912 Unspecified disorder of synovium and tendon, left shoulder: Secondary | ICD-10-CM | POA: Diagnosis not present

## 2016-04-19 DIAGNOSIS — Z23 Encounter for immunization: Secondary | ICD-10-CM | POA: Diagnosis not present

## 2016-04-19 DIAGNOSIS — Z952 Presence of prosthetic heart valve: Secondary | ICD-10-CM | POA: Diagnosis not present

## 2016-04-19 DIAGNOSIS — Z7901 Long term (current) use of anticoagulants: Secondary | ICD-10-CM | POA: Diagnosis not present

## 2016-05-11 DIAGNOSIS — Z952 Presence of prosthetic heart valve: Secondary | ICD-10-CM | POA: Diagnosis not present

## 2016-05-11 DIAGNOSIS — Z7901 Long term (current) use of anticoagulants: Secondary | ICD-10-CM | POA: Diagnosis not present

## 2016-06-12 DIAGNOSIS — R05 Cough: Secondary | ICD-10-CM | POA: Diagnosis not present

## 2016-06-12 DIAGNOSIS — Z7901 Long term (current) use of anticoagulants: Secondary | ICD-10-CM | POA: Diagnosis not present

## 2016-06-14 DIAGNOSIS — Z952 Presence of prosthetic heart valve: Secondary | ICD-10-CM | POA: Diagnosis not present

## 2016-06-14 DIAGNOSIS — Z7901 Long term (current) use of anticoagulants: Secondary | ICD-10-CM | POA: Diagnosis not present

## 2016-06-19 DIAGNOSIS — J329 Chronic sinusitis, unspecified: Secondary | ICD-10-CM | POA: Diagnosis not present

## 2016-06-21 DIAGNOSIS — Z952 Presence of prosthetic heart valve: Secondary | ICD-10-CM | POA: Diagnosis not present

## 2016-06-21 DIAGNOSIS — Z7901 Long term (current) use of anticoagulants: Secondary | ICD-10-CM | POA: Diagnosis not present

## 2016-07-05 DIAGNOSIS — Z7901 Long term (current) use of anticoagulants: Secondary | ICD-10-CM | POA: Diagnosis not present

## 2016-07-05 DIAGNOSIS — Z952 Presence of prosthetic heart valve: Secondary | ICD-10-CM | POA: Diagnosis not present

## 2016-07-14 DIAGNOSIS — R062 Wheezing: Secondary | ICD-10-CM | POA: Diagnosis not present

## 2016-07-14 DIAGNOSIS — J209 Acute bronchitis, unspecified: Secondary | ICD-10-CM | POA: Diagnosis not present

## 2016-07-20 DIAGNOSIS — Z952 Presence of prosthetic heart valve: Secondary | ICD-10-CM | POA: Diagnosis not present

## 2016-07-20 DIAGNOSIS — Z7901 Long term (current) use of anticoagulants: Secondary | ICD-10-CM | POA: Diagnosis not present

## 2016-07-20 DIAGNOSIS — R7301 Impaired fasting glucose: Secondary | ICD-10-CM | POA: Diagnosis not present

## 2016-07-28 DIAGNOSIS — Z7901 Long term (current) use of anticoagulants: Secondary | ICD-10-CM | POA: Diagnosis not present

## 2016-07-28 DIAGNOSIS — Z952 Presence of prosthetic heart valve: Secondary | ICD-10-CM | POA: Diagnosis not present

## 2016-08-07 ENCOUNTER — Ambulatory Visit (INDEPENDENT_AMBULATORY_CARE_PROVIDER_SITE_OTHER): Payer: Medicare Other | Admitting: Physician Assistant

## 2016-08-07 ENCOUNTER — Telehealth: Payer: Self-pay | Admitting: Cardiology

## 2016-08-07 ENCOUNTER — Encounter: Payer: Self-pay | Admitting: Physician Assistant

## 2016-08-07 VITALS — BP 100/78 | HR 111 | Ht 70.0 in | Wt 233.0 lb

## 2016-08-07 DIAGNOSIS — R0602 Shortness of breath: Secondary | ICD-10-CM | POA: Diagnosis not present

## 2016-08-07 DIAGNOSIS — I10 Essential (primary) hypertension: Secondary | ICD-10-CM

## 2016-08-07 DIAGNOSIS — I5043 Acute on chronic combined systolic (congestive) and diastolic (congestive) heart failure: Secondary | ICD-10-CM

## 2016-08-07 DIAGNOSIS — Z952 Presence of prosthetic heart valve: Secondary | ICD-10-CM

## 2016-08-07 DIAGNOSIS — Z79899 Other long term (current) drug therapy: Secondary | ICD-10-CM | POA: Diagnosis not present

## 2016-08-07 DIAGNOSIS — I4891 Unspecified atrial fibrillation: Secondary | ICD-10-CM | POA: Diagnosis not present

## 2016-08-07 MED ORDER — FUROSEMIDE 40 MG PO TABS: 40.0000 mg | ORAL_TABLET | Freq: Every day | ORAL | 3 refills | Status: DC

## 2016-08-07 MED ORDER — METOPROLOL TARTRATE 25 MG PO TABS: 12.5000 mg | ORAL_TABLET | Freq: Two times a day (BID) | ORAL | 3 refills | Status: DC

## 2016-08-07 NOTE — Telephone Encounter (Signed)
Pt reporting dyspneic on exertion, x 2 weeks. Reports cough. Denies swelling on legs, fluid/weight gain etc. He's not currently short of breath at rest. Denies chest pain.  Notes he was treated by PCP for cough/congestion, still having problem.  States concern for artificial heart valve and wanted to seek evaluation, see whether echo, other testing was recommended. Aware we can see for APP schedule opening today. Have added him to schedule and he's aware of appt details.

## 2016-08-07 NOTE — Patient Instructions (Signed)
Medication Instructions:  STOP taking amlodipine/valsartan.  START taking metoprolol tartrate 12.5mg  (1/2 tablet) two times a day. START lasix 40mg  (1 tablet) once daily.  Labwork: Have lab work completed TODAY.  Testing/Procedures: NONE  Follow-Up: Friday, 1/19 at 8am with Almyra Deforest, PA    If you need a refill on your cardiac medications before your next appointment, please call your pharmacy.

## 2016-08-07 NOTE — Progress Notes (Signed)
Cardiology Office Note    Date:  08/07/2016   ID:  Adrian Lambert, DOB [birth year], MRN [id number]  PCP:  Adrian Pac, MD  Cardiologist:  Dr. Martinique  Chief Complaint  Patient presents with  . Follow-up    pt c/o of dry cough and SOB on minimal exertion and gasping for breath when laying down--since after Thanksgiving. Has also noticed occasional lightheadedness when bending over. Antibiotics, Prednisone and cough medications have not helped. Also states he has noticed mild swelling in feet and ankles    History of Present Illness:  Adrian Lambert is a 76 y.o. male with PMH of HTN and h/o severe AS s/p #25 mm St Jude mechanical prosthetic aortic valve replacement 03/2003. He has been on chronic Coumadin therapy. His last echocardiogram obtained on 02/08/2016 showed EF 45-50%, overall poor quality and a caustic windows despite Definity IV contrast, there was inferolateral hypokinesis, grade 2 diastolic dysfunction, ascending aortic diameter was 44 mm. His Coumadin level is followed by Adrian Lambert. He has also been treated for prostate cancer with radioactive seed implant. Although paroxysmal atrial fibrillation is listed as a past diagnosis, however I am unable to locate any EKG dating back to 2012 that showed atrial fibrillation.   According to Adrian Lambert, he has been having worsening shortness of breath for the past 6 weeks. He has finished a course of antibiotic and prednisone therapy without any improvement. For the past several weeks he has also been noticing increasing lower extremity edema as well. He denies any chest discomfort or palpitation. During today office visit, and it was noted patient is in atrial fibrillation with RVR. He also appears to be massively fluid overloaded with at least 2-3+ pitting edema. He has been having a nonproductive cough for the past several weeks as well. He also endorsed orthopnea and paroxysmal nocturnal dyspnea. He is clearly in acute on chronic  combined systolic and diastolic heart failure. This is likely exacerbated by the new onset of atrial fibrillation with RVR. Despite a prior diagnosis of atrial fibrillation documented in his past medical history, patient denies any prior knowledge of atrial fibrillation and I could not locate any previous EKG that confirmed atrial fibrillation prior to this point. Fortunately, he has been followed by Adrian Lambert and his Coumadin level is well controlled. I will recheck her Coumadin level today. If his INR is therapeutic, we can potentially consider cardioversion as an option once we request PT/INR record from Adrian Lambert office. His blood pressure is borderline today, I will discontinue amlodipine-valsartan and started on rate control medication metoprolol tartrate 12.5 mg twice a day. I will also initiate Lasix 40 mg daily. I will obtain basic metabolic panel, CBC, and BNP as well. I plan to see the patient back this Friday for evaluation of his volume status and heart rate. Given the degree of volume overload, we may have to consider a repeat echocardiogram later once the heart rate is under better control and he is fully diuresed.   Past Medical History:  Diagnosis Date  . Anticoagulant long-term use   . Arthritis    "probably in my thumbs" (06/26/2012)  . Bifascicular block   . Complication of anesthesia    "wake up w/a start; hallucinations" (06/26/2012)  . Diverticulosis   . Gouty arthritis    "have had it in both feet, ankles, right knee" (06/26/2012)  . History of aortic stenosis    CRITICAL  2004  S/P AVR  . History of atrial  fibrillation   . History of diverticulitis of colon   . History of kidney stones   . History of pancreatitis   . Hypertension   . Incomplete RBBB   . S/P AVR (aortic valve replacement)    2004    Past Surgical History:  Procedure Laterality Date  . AORTIC VALVE REPLACEMENT  04-14-2003  DR Adrian Lambert   #63mm ST JUDE MECHNICAL PROSTHESIS  . CATARACT EXTRACTION W/  INTRAOCULAR LENS  IMPLANT, BILATERAL  RIGHT 2010/  LEFT DEC 2013  . HEMICOLECTOMY  10/06/2003   Laparoscopic-assisted left hemicolectomy for diverticulitis  . LAPAROSCOPIC CHOLECYSTECTOMY  07-23-2006  . PROSTATE BIOPSY  10/09/2012   gleason 3+4=7  . RADIOACTIVE SEED IMPLANT N/A 01/09/2013   Procedure: RADIOACTIVE SEED IMPLANT;  Surgeon: Adrian Gallo, MD;  Location: [facility];  Service: Urology;  Laterality: N/A;  . TRANSTHORACIC ECHOCARDIOGRAM  04-28-2008   MILD LVH / NORMAL LSF/ NORMAL AORTIC VALVE MECHANICAL PROSTHESIS FUNCTION/ MILD LAE/ EF 55-60%    Current Medications: Outpatient Medications Prior to Visit  Medication Sig Dispense Refill  . allopurinol (ZYLOPRIM) 300 MG tablet Take 300 mg by mouth daily.     . colchicine 0.6 MG tablet Take 0.6 mg by mouth as needed. As needed    . Psyllium (METAMUCIL PO) Take by mouth daily.    . tamsulosin (FLOMAX) 0.4 MG CAPS capsule Take 0.4 mg by mouth daily.    Marland Kitchen tolterodine (DETROL LA) 4 MG 24 hr capsule Take 4 mg by mouth daily. Take 1 cap daily  0  . warfarin (COUMADIN) 5 MG tablet Take 5 mg by mouth daily.     Marland Kitchen amLODipine-valsartan (EXFORGE) 5-160 MG per tablet Take 1 tablet by mouth daily.      No facility-administered medications prior to visit.      Allergies:   Celecoxib   Social History   Social History  . Marital status: Married    Spouse name: N/A  . Number of children: 4  . 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 Smoking status: Never Smoker  . Smokeless tobacco: Never Used  . Alcohol use 1.8 oz/week    3 Cans of beer per week     Comment:  "may have 2-3 beers on the weekend"  . Drug use: No  . Sexual activity: Not Asked   Other Topics Concern  . None   Social History Narrative  . None     Family History:  The patient's family history includes Diabetes in his mother; Heart failure in his mother; Prostate cancer in his  father.   ROS:   Please see the history of present illness.    ROS All other systems reviewed and are negative.   PHYSICAL EXAM:   VS:  BP 100/78 (BP Location: Right Arm, Patient Position: Sitting, Cuff Size: Large)   Pulse (!) 111   Ht 5\' 10"  (1.778 m)   Wt 233 lb (105.7 kg)   BMI 33.43 kg/m    GEN: Well nourished, well developed, in no acute distress  HEENT: normal  Neck: no JVD, carotid bruits, or masses Cardiac: Irregularly irregular; no rubs, or gallops. 2-3 + pitting edema in BLE, crisp aortic valve click Respiratory:  clear to auscultation bilaterally, normal work of breathing GI: soft, nontender, nondistended, + BS MS: no deformity or atrophy  Skin: warm and dry, no rash Neuro:  Alert and Oriented x  3, Strength and sensation are intact Psych: euthymic mood, full affect  Wt Readings from Last 3 Encounters:  08/07/16 233 lb (105.7 kg)  01/20/16 227 lb (103 kg)  02/02/15 229 lb (103.9 kg)      Studies/Labs Reviewed:   EKG:  EKG is ordered today.  The ekg ordered today demonstrates Atrial fibrillation with RVR  Recent Labs: No results found for requested labs within last 8760 hours.   Lipid Panel No results found for: CHOL, TRIG, HDL, CHOLHDL, VLDL, LDLCALC, LDLDIRECT  Additional studies/ records that were reviewed today include:   Echo 02/08/2016 LV EF: 45% -   50%  - Procedure narrative: Transthoracic echocardiography for left   ventricular function evaluation and for assessment of valvular   function. Image quality was poor. The study was technically   difficult, as a result of poor acoustic windows, poor sound wave   transmission, and body habitus. Intravenous contrast (Definity)   was administered to opacify the LV. - Left ventricle: The cavity size was normal. There was moderate   concentric hypertrophy. Incoordinate septal motion. Systolic   function was mildly reduced. The estimated ejection fraction was   in the range of 45% to 50%. Inferolateral  hypokinesis. Doppler   parameters are consistent with pseudonormal left ventricular   relaxation (grade 2 diastolic dysfunction). The E/e&' ratio is   >15, suggesting elevated LV fililng pressure. Ejection fraction   (MOD, 2-plane): 45%. - Aortic valve: Mechanical aortic valve. No obstruction. There was   trivial regurgitation. Mean gradient (S): 14 mm Hg. Peak gradient   (S): 27 mm Hg. - Aorta: Ascending aortic diameter: 44 mm (S). - Ascending aorta: The ascending aorta was mildly dilated. - Mitral valve: Mildly thickened leaflets . There was trivial   regurgitation. - Left atrium: Moderately dilated. - Tricuspid valve: There was no significant regurgitation. - Inferior vena cava: The vessel was normal in size. The   respirophasic diameter changes were in the normal range (>= 50%),   consistent with normal central venous pressure.  Impressions:  - Compared to prior studies, the LVEF is reduced to 45-50%. There   apperas to be inferolateral hypokinesis. Stage 2 DD with elevated   LV filling pressure is noted. The mechanical AVR is not   obstructed.   ASSESSMENT:    1. Acute on chronic combined systolic and diastolic CHF (congestive heart failure) ([place])   2. SOB (shortness of breath)   3. Atrial fibrillation, unspecified type ([place])   4. Medication management   5. H/O heart valve replacement with mechanical valve   6. Essential hypertension      PLAN:  In order of problems listed above:  1. Atrial fibrillation with RVR: Likely has been ongoing since early December. Fortunately patient has been taking Coumadin for mechanical aortic valve. We'll check PT/INR today. Will need better rate control, however his blood pressure is borderline, I will discontinue his amlodipine-valsartan and initiate metoprolol titrate 12.5 mg twice a day. His EF is likely lower by this point, once he is able to tolerate metoprolol tartrate, I may switch to metoprolol succinate.   2. Acute on chronic  combined systolic and diastolic heart failure: He has at least 2-3+ pitting edema in bilateral lower extremity. I will start him on Lasix 40 mg daily. He has been having orthopnea and paroxysmal nocturnal dyspnea. The only reason I did not send him to the hospital today is because his O2 saturation is 95% on room air and he also mentions his  degree of shortness of breath has not changed in the last week. We can potentially consider having a repeat echocardiogram later, however prior to that he will need his heart rate to be under better control and volume status improved. His EF is likely lower by this point given persistent atrial fibrillation with RVR. Check BMET, CBC, PT/INR and BNP  3. H/o severe AS s/p St Jude mechanical aortic valve replacement: On chronic Coumadin, level checked by Adrian Lambert  4. HTN: We will switch his amlodipine-valsartan to metoprolol tartrate 12.5 mg twice a day, we will uptitrate metoprolol as needed. Likely transition to metoprolol succinate XL at some point.    Medication Adjustments/Labs and Tests Ordered: Current medicines are reviewed at length with the patient today.  Concerns regarding medicines are outlined above.  Medication changes, Labs and Tests ordered today are listed in the Patient Instructions below. Patient Instructions  Medication Instructions:  STOP taking amlodipine/valsartan.  START taking metoprolol tartrate 12.5mg  (1/2 tablet) two times a day. START lasix 40mg  (1 tablet) once daily.  Labwork: Have lab work completed TODAY.  Testing/Procedures: NONE  Follow-Up: Friday, 1/19 at 8am with Almyra Deforest, PA    If you need a refill on your cardiac medications before your next appointment, please call your pharmacy.      Hilbert Corrigan, [place]  08/07/2016 6:55 PM    [facility] [address], [place], [place]  [address] Phone: [phone]; Fax: [phone]

## 2016-08-08 LAB — CBC
HEMATOCRIT: 41.7 % (ref 38.5–50.0)
HEMOGLOBIN: 13 g/dL — AB (ref 13.2–17.1)
MCH: 19.6 pg — ABNORMAL LOW (ref 27.0–33.0)
MCHC: 31.2 g/dL — AB (ref 32.0–36.0)
MCV: 62.9 fL — AB (ref 80.0–100.0)
Platelets: 196 10*3/uL (ref 140–400)
RBC: 6.63 MIL/uL — ABNORMAL HIGH (ref 4.20–5.80)
RDW: 18.4 % — ABNORMAL HIGH (ref 11.0–15.0)
WBC: 12 10*3/uL — ABNORMAL HIGH (ref 3.8–10.8)

## 2016-08-08 LAB — BASIC METABOLIC PANEL
BUN: 19 mg/dL (ref 7–25)
CALCIUM: 9.5 mg/dL (ref 8.6–10.3)
CO2: 27 mmol/L (ref 20–31)
Chloride: 103 mmol/L (ref 98–110)
Creat: 1.22 mg/dL — ABNORMAL HIGH (ref 0.70–1.18)
Glucose, Bld: 101 mg/dL — ABNORMAL HIGH (ref 65–99)
POTASSIUM: 5.4 mmol/L — AB (ref 3.5–5.3)
SODIUM: 139 mmol/L (ref 135–146)

## 2016-08-08 LAB — PROTIME-INR
INR: 1.6 — ABNORMAL HIGH
Prothrombin Time: 16.6 s — ABNORMAL HIGH (ref 9.0–11.5)

## 2016-08-08 LAB — BRAIN NATRIURETIC PEPTIDE: BRAIN NATRIURETIC PEPTIDE: 328.4 pg/mL — AB (ref <100)

## 2016-08-08 NOTE — Telephone Encounter (Signed)
Thank you. Patient seen yesterday, treatment plan adjusted

## 2016-08-11 ENCOUNTER — Ambulatory Visit (HOSPITAL_COMMUNITY)
Admission: RE | Admit: 2016-08-11 | Discharge: 2016-08-11 | Disposition: A | Discharge status: Home / Self Care | Payer: Medicare Other | Source: Ambulatory Visit | Attending: Family Medicine | Admitting: Family Medicine

## 2016-08-11 ENCOUNTER — Other Ambulatory Visit (HOSPITAL_COMMUNITY): Payer: Medicare Other

## 2016-08-11 ENCOUNTER — Ambulatory Visit: Payer: Medicare Other | Admitting: Physician Assistant

## 2016-08-11 ENCOUNTER — Other Ambulatory Visit: Payer: Self-pay

## 2016-08-11 DIAGNOSIS — I4891 Unspecified atrial fibrillation: Secondary | ICD-10-CM | POA: Diagnosis not present

## 2016-08-11 DIAGNOSIS — Z952 Presence of prosthetic heart valve: Secondary | ICD-10-CM

## 2016-08-11 DIAGNOSIS — Z7901 Long term (current) use of anticoagulants: Secondary | ICD-10-CM | POA: Diagnosis not present

## 2016-08-11 MED ORDER — PERFLUTREN LIPID MICROSPHERE
1.0000 mL | INTRAVENOUS | Status: AC | PRN
  Administered 2016-08-11: 3 mL via INTRAVENOUS
  Filled 2016-08-11: qty 10

## 2016-08-11 NOTE — Progress Notes (Signed)
  Echocardiogram 2D Echocardiogram has been performed.  Adrian Lambert 08/11/2016, 3:28 PM

## 2016-08-14 ENCOUNTER — Telehealth: Payer: Self-pay | Admitting: Physician Assistant

## 2016-08-14 NOTE — Telephone Encounter (Signed)
Thank you :)

## 2016-08-14 NOTE — Telephone Encounter (Signed)
Pt says Eulas Post was supposed to be getting back with him about his medicine,He says he had not heard anything from him.

## 2016-08-14 NOTE — Telephone Encounter (Signed)
I spoke to patient. States he got a call from [place] but was driving and needed a call back. He thinks this is based on echo, didn't know if changes were recommended for his medication. He sees Geneticist, molecular for his f/u. Can you give some guidance on this? Anything required as far as changes or will this be addressed at appt tomorrow?

## 2016-08-14 NOTE — Telephone Encounter (Signed)
I have called the patient myself, echo was poor study but it did show EF is down. Apparent followup has been rescheduled with Gerald Stabs tomorrow as since his 8AM appt with me last Friday was cancelled due to [place] weather. Our goal is still rate control for now and evaluate volume status. He is taking coumadin, his INR was subtherapeutic. Once his INR is therapeutic for 3 weeks, may consider DCCV. On followup tomorrow, likely change metoprolol tartrate to Metoprolol succinate and add ACEI/ARB and spironolactone. Eventually he will need a repeat echo to reassess EF, the timing will be determined later, likely not for another 3 month.

## 2016-08-15 ENCOUNTER — Encounter (HOSPITAL_COMMUNITY): Payer: Self-pay | Admitting: *Deleted

## 2016-08-15 ENCOUNTER — Emergency Department (HOSPITAL_COMMUNITY): Payer: Medicare Other

## 2016-08-15 ENCOUNTER — Inpatient Hospital Stay (HOSPITAL_COMMUNITY)
Admission: EM | Admit: 2016-08-15 | Discharge: 2016-08-29 | DRG: 291 | Disposition: A | Discharge status: Home / Self Care | Payer: Medicare Other | Attending: Cardiology | Admitting: Cardiology

## 2016-08-15 ENCOUNTER — Encounter: Payer: Self-pay | Admitting: Nurse Practitioner

## 2016-08-15 ENCOUNTER — Ambulatory Visit (INDEPENDENT_AMBULATORY_CARE_PROVIDER_SITE_OTHER): Payer: Medicare Other | Admitting: Nurse Practitioner

## 2016-08-15 DIAGNOSIS — Z888 Allergy status to other drugs, medicaments and biological substances status: Secondary | ICD-10-CM

## 2016-08-15 DIAGNOSIS — I4891 Unspecified atrial fibrillation: Secondary | ICD-10-CM | POA: Diagnosis present

## 2016-08-15 DIAGNOSIS — Z7901 Long term (current) use of anticoagulants: Secondary | ICD-10-CM

## 2016-08-15 DIAGNOSIS — I481 Persistent atrial fibrillation: Secondary | ICD-10-CM | POA: Diagnosis present

## 2016-08-15 DIAGNOSIS — I5023 Acute on chronic systolic (congestive) heart failure: Secondary | ICD-10-CM

## 2016-08-15 DIAGNOSIS — C61 Malignant neoplasm of prostate: Secondary | ICD-10-CM | POA: Diagnosis not present

## 2016-08-15 DIAGNOSIS — Z7982 Long term (current) use of aspirin: Secondary | ICD-10-CM

## 2016-08-15 DIAGNOSIS — E876 Hypokalemia: Secondary | ICD-10-CM | POA: Diagnosis present

## 2016-08-15 DIAGNOSIS — Q211 Atrial septal defect: Secondary | ICD-10-CM

## 2016-08-15 DIAGNOSIS — Z8249 Family history of ischemic heart disease and other diseases of the circulatory system: Secondary | ICD-10-CM

## 2016-08-15 DIAGNOSIS — I5043 Acute on chronic combined systolic (congestive) and diastolic (congestive) heart failure: Secondary | ICD-10-CM | POA: Diagnosis not present

## 2016-08-15 DIAGNOSIS — E669 Obesity, unspecified: Secondary | ICD-10-CM | POA: Diagnosis present

## 2016-08-15 DIAGNOSIS — Z9841 Cataract extraction status, right eye: Secondary | ICD-10-CM | POA: Diagnosis not present

## 2016-08-15 DIAGNOSIS — Z961 Presence of intraocular lens: Secondary | ICD-10-CM | POA: Diagnosis present

## 2016-08-15 DIAGNOSIS — N183 Chronic kidney disease, stage 3 (moderate): Secondary | ICD-10-CM | POA: Diagnosis present

## 2016-08-15 DIAGNOSIS — Z87442 Personal history of urinary calculi: Secondary | ICD-10-CM | POA: Diagnosis not present

## 2016-08-15 DIAGNOSIS — I340 Nonrheumatic mitral (valve) insufficiency: Secondary | ICD-10-CM | POA: Diagnosis not present

## 2016-08-15 DIAGNOSIS — Z952 Presence of prosthetic heart valve: Secondary | ICD-10-CM

## 2016-08-15 DIAGNOSIS — Z8042 Family history of malignant neoplasm of prostate: Secondary | ICD-10-CM

## 2016-08-15 DIAGNOSIS — M109 Gout, unspecified: Secondary | ICD-10-CM | POA: Diagnosis present

## 2016-08-15 DIAGNOSIS — Z833 Family history of diabetes mellitus: Secondary | ICD-10-CM

## 2016-08-15 DIAGNOSIS — E871 Hypo-osmolality and hyponatremia: Secondary | ICD-10-CM | POA: Diagnosis not present

## 2016-08-15 DIAGNOSIS — Z9889 Other specified postprocedural states: Secondary | ICD-10-CM

## 2016-08-15 DIAGNOSIS — Z9049 Acquired absence of other specified parts of digestive tract: Secondary | ICD-10-CM

## 2016-08-15 DIAGNOSIS — I110 Hypertensive heart disease with heart failure: Secondary | ICD-10-CM | POA: Diagnosis not present

## 2016-08-15 DIAGNOSIS — I10 Essential (primary) hypertension: Secondary | ICD-10-CM | POA: Diagnosis not present

## 2016-08-15 DIAGNOSIS — I480 Paroxysmal atrial fibrillation: Secondary | ICD-10-CM | POA: Diagnosis not present

## 2016-08-15 DIAGNOSIS — I429 Cardiomyopathy, unspecified: Secondary | ICD-10-CM | POA: Diagnosis not present

## 2016-08-15 DIAGNOSIS — N179 Acute kidney failure, unspecified: Secondary | ICD-10-CM | POA: Diagnosis present

## 2016-08-15 DIAGNOSIS — Z452 Encounter for adjustment and management of vascular access device: Secondary | ICD-10-CM | POA: Diagnosis not present

## 2016-08-15 DIAGNOSIS — I509 Heart failure, unspecified: Secondary | ICD-10-CM | POA: Diagnosis not present

## 2016-08-15 DIAGNOSIS — Z9842 Cataract extraction status, left eye: Secondary | ICD-10-CM

## 2016-08-15 DIAGNOSIS — I4819 Other persistent atrial fibrillation: Secondary | ICD-10-CM

## 2016-08-15 DIAGNOSIS — R05 Cough: Secondary | ICD-10-CM | POA: Diagnosis not present

## 2016-08-15 DIAGNOSIS — Z79899 Other long term (current) drug therapy: Secondary | ICD-10-CM

## 2016-08-15 DIAGNOSIS — I130 Hypertensive heart and chronic kidney disease with heart failure and stage 1 through stage 4 chronic kidney disease, or unspecified chronic kidney disease: Secondary | ICD-10-CM | POA: Diagnosis not present

## 2016-08-15 DIAGNOSIS — N289 Disorder of kidney and ureter, unspecified: Secondary | ICD-10-CM | POA: Diagnosis not present

## 2016-08-15 LAB — CBC WITH DIFFERENTIAL/PLATELET
BASOS PCT: 0 %
Basophils Absolute: 0 10*3/uL (ref 0.0–0.1)
Eosinophils Absolute: 0 10*3/uL (ref 0.0–0.7)
Eosinophils Relative: 0 %
HEMATOCRIT: 40.2 % (ref 39.0–52.0)
HEMOGLOBIN: 13.2 g/dL (ref 13.0–17.0)
LYMPHS PCT: 10 %
Lymphs Abs: 1.2 10*3/uL (ref 0.7–4.0)
MCH: 19.5 pg — ABNORMAL LOW (ref 26.0–34.0)
MCHC: 32.8 g/dL (ref 30.0–36.0)
MCV: 59.5 fL — AB (ref 78.0–100.0)
Monocytes Absolute: 0.9 10*3/uL (ref 0.1–1.0)
Monocytes Relative: 7 %
NEUTROS PCT: 83 %
Neutro Abs: 10.1 10*3/uL — ABNORMAL HIGH (ref 1.7–7.7)
PLATELETS: 283 10*3/uL (ref 150–400)
RBC: 6.76 MIL/uL — ABNORMAL HIGH (ref 4.22–5.81)
RDW: 17.1 % — ABNORMAL HIGH (ref 11.5–15.5)
WBC: 12.2 10*3/uL — ABNORMAL HIGH (ref 4.0–10.5)

## 2016-08-15 LAB — COMPREHENSIVE METABOLIC PANEL
ALK PHOS: 89 U/L (ref 38–126)
ALT: 34 U/L (ref 17–63)
AST: 35 U/L (ref 15–41)
Albumin: 3.9 g/dL (ref 3.5–5.0)
Anion gap: 13 (ref 5–15)
BILIRUBIN TOTAL: 1.5 mg/dL — AB (ref 0.3–1.2)
BUN: 24 mg/dL — AB (ref 6–20)
CHLORIDE: 90 mmol/L — AB (ref 101–111)
CO2: 25 mmol/L (ref 22–32)
CREATININE: 1.29 mg/dL — AB (ref 0.61–1.24)
Calcium: 9 mg/dL (ref 8.9–10.3)
GFR calc Af Amer: >60 mL/min (ref >60)
GFR, EST NON AFRICAN AMERICAN: 53 mL/min — AB (ref >60)
Glucose, Bld: 130 mg/dL — ABNORMAL HIGH (ref 65–99)
Potassium: 4.2 mmol/L (ref 3.5–5.1)
Sodium: 128 mmol/L — ABNORMAL LOW (ref 135–145)
Total Protein: 6.3 g/dL — ABNORMAL LOW (ref 6.5–8.1)

## 2016-08-15 LAB — CBC
HEMATOCRIT: 41.4 % (ref 39.0–52.0)
HEMOGLOBIN: 13.5 g/dL (ref 13.0–17.0)
MCH: 19.4 pg — ABNORMAL LOW (ref 26.0–34.0)
MCHC: 32.6 g/dL (ref 30.0–36.0)
MCV: 59.5 fL — ABNORMAL LOW (ref 78.0–100.0)
Platelets: 301 10*3/uL (ref 150–400)
RBC: 6.96 MIL/uL — ABNORMAL HIGH (ref 4.22–5.81)
RDW: 17 % — ABNORMAL HIGH (ref 11.5–15.5)
WBC: 13.2 10*3/uL — AB (ref 4.0–10.5)

## 2016-08-15 LAB — BASIC METABOLIC PANEL
ANION GAP: 13 (ref 5–15)
BUN: 25 mg/dL — ABNORMAL HIGH (ref 6–20)
CALCIUM: 9.1 mg/dL (ref 8.9–10.3)
CO2: 24 mmol/L (ref 22–32)
Chloride: 90 mmol/L — ABNORMAL LOW (ref 101–111)
Creatinine, Ser: 1.34 mg/dL — ABNORMAL HIGH (ref 0.61–1.24)
GFR, EST AFRICAN AMERICAN: 58 mL/min — AB (ref >60)
GFR, EST NON AFRICAN AMERICAN: 50 mL/min — AB (ref >60)
Glucose, Bld: 143 mg/dL — ABNORMAL HIGH (ref 65–99)
POTASSIUM: 4.6 mmol/L (ref 3.5–5.1)
SODIUM: 127 mmol/L — AB (ref 135–145)

## 2016-08-15 LAB — I-STAT CHEM 8, ED
BUN: 29 mg/dL — ABNORMAL HIGH (ref 6–20)
Calcium, Ion: 1 mmol/L — ABNORMAL LOW (ref 1.15–1.40)
Chloride: 92 mmol/L — ABNORMAL LOW (ref 101–111)
Creatinine, Ser: 1.2 mg/dL (ref 0.61–1.24)
GLUCOSE: 147 mg/dL — AB (ref 65–99)
HEMATOCRIT: 47 % (ref 39.0–52.0)
HEMOGLOBIN: 16 g/dL (ref 13.0–17.0)
POTASSIUM: 4.6 mmol/L (ref 3.5–5.1)
Sodium: 126 mmol/L — ABNORMAL LOW (ref 135–145)
TCO2: 25 mmol/L (ref 0–100)

## 2016-08-15 LAB — MAGNESIUM: MAGNESIUM: 2.2 mg/dL (ref 1.7–2.4)

## 2016-08-15 LAB — I-STAT CG4 LACTIC ACID, ED: Lactic Acid, Venous: 3.03 mmol/L (ref 0.5–1.9)

## 2016-08-15 LAB — PROTIME-INR
INR: 2.13
PROTHROMBIN TIME: 24.1 s — AB (ref 11.4–15.2)

## 2016-08-15 LAB — BRAIN NATRIURETIC PEPTIDE: B Natriuretic Peptide: 630.8 pg/mL — ABNORMAL HIGH (ref 0.0–100.0)

## 2016-08-15 LAB — TSH: TSH: 3.453 u[IU]/mL (ref 0.350–4.500)

## 2016-08-15 MED ORDER — SODIUM CHLORIDE 0.9% FLUSH
3.0000 mL | Freq: Two times a day (BID) | INTRAVENOUS | Status: DC
  Administered 2016-08-15 – 2016-08-26 (×14): 3 mL via INTRAVENOUS

## 2016-08-15 MED ORDER — TAMSULOSIN HCL 0.4 MG PO CAPS
0.4000 mg | ORAL_CAPSULE | Freq: Every day | ORAL | Status: DC
  Administered 2016-08-15: 0.4 mg via ORAL
  Filled 2016-08-15: qty 1

## 2016-08-15 MED ORDER — FUROSEMIDE 10 MG/ML IJ SOLN
80.0000 mg | Freq: Two times a day (BID) | INTRAMUSCULAR | Status: DC
  Administered 2016-08-16 (×2): 80 mg via INTRAVENOUS
  Filled 2016-08-15 (×4): qty 8

## 2016-08-15 MED ORDER — METOPROLOL SUCCINATE ER 25 MG PO TB24
25.0000 mg | ORAL_TABLET | Freq: Every day | ORAL | Status: DC
  Administered 2016-08-15 – 2016-08-29 (×15): 25 mg via ORAL
  Filled 2016-08-15 (×15): qty 1

## 2016-08-15 MED ORDER — ALLOPURINOL 300 MG PO TABS
300.0000 mg | ORAL_TABLET | Freq: Every day | ORAL | Status: DC
  Administered 2016-08-16 – 2016-08-29 (×14): 300 mg via ORAL
  Filled 2016-08-15 (×15): qty 1

## 2016-08-15 MED ORDER — ONDANSETRON HCL 4 MG/2ML IJ SOLN: 4.0000 mg | Freq: Four times a day (QID) | INTRAMUSCULAR | Status: DC | PRN

## 2016-08-15 MED ORDER — ACETAMINOPHEN 325 MG PO TABS: 650.0000 mg | ORAL_TABLET | ORAL | Status: DC | PRN

## 2016-08-15 MED ORDER — WARFARIN SODIUM 2.5 MG PO TABS
2.5000 mg | ORAL_TABLET | ORAL | Status: DC
  Administered 2016-08-15 – 2016-08-19 (×3): 2.5 mg via ORAL
  Filled 2016-08-15 (×4): qty 1

## 2016-08-15 MED ORDER — WARFARIN - PHARMACIST DOSING INPATIENT
Freq: Every day | Status: DC
  Administered 2016-08-15: 1
  Administered 2016-08-16 – 2016-08-21 (×4)

## 2016-08-15 MED ORDER — FESOTERODINE FUMARATE ER 4 MG PO TB24
4.0000 mg | ORAL_TABLET | Freq: Every day | ORAL | Status: DC
  Administered 2016-08-15 – 2016-08-29 (×15): 4 mg via ORAL
  Filled 2016-08-15 (×15): qty 1

## 2016-08-15 MED ORDER — SODIUM CHLORIDE 0.9 % IV SOLN
250.0000 mL | INTRAVENOUS | Status: DC | PRN
  Administered 2016-08-18: 09:00:00 via INTRAVENOUS

## 2016-08-15 MED ORDER — GUAIFENESIN-DM 100-10 MG/5ML PO SYRP
5.0000 mL | ORAL_SOLUTION | ORAL | Status: DC | PRN
  Administered 2016-08-15 – 2016-08-19 (×6): 5 mL via ORAL
  Filled 2016-08-15 (×6): qty 5

## 2016-08-15 MED ORDER — TAMSULOSIN HCL 0.4 MG PO CAPS
0.4000 mg | ORAL_CAPSULE | ORAL | Status: DC
  Administered 2016-08-17 – 2016-08-29 (×8): 0.4 mg via ORAL
  Filled 2016-08-15 (×11): qty 1

## 2016-08-15 MED ORDER — METOPROLOL SUCCINATE ER 25 MG PO TB24
25.0000 mg | ORAL_TABLET | Freq: Every day | ORAL | Status: DC
  Filled 2016-08-15: qty 1

## 2016-08-15 MED ORDER — FUROSEMIDE 10 MG/ML IJ SOLN
40.0000 mg | Freq: Once | INTRAMUSCULAR | Status: AC
  Administered 2016-08-15: 40 mg via INTRAVENOUS
  Filled 2016-08-15: qty 4

## 2016-08-15 MED ORDER — SODIUM CHLORIDE 0.9% FLUSH: 3.0000 mL | INTRAVENOUS | Status: DC | PRN

## 2016-08-15 MED ORDER — WARFARIN SODIUM 5 MG PO TABS
5.0000 mg | ORAL_TABLET | ORAL | Status: DC
  Administered 2016-08-16 – 2016-08-18 (×2): 5 mg via ORAL
  Filled 2016-08-15 (×2): qty 1

## 2016-08-15 NOTE — Progress Notes (Signed)
ANTICOAGULATION CONSULT NOTE - Initial Consult  Pharmacy Consult for coumadin Indication: AVR and afib  Allergies  Allergen Reactions  . Celecoxib Rash and Other (See Comments)    Celebrex    Patient Measurements:   Vital Signs: BP: 91/71 (01/23 1430) Pulse Rate: 102 (01/23 1430)  Labs:  Recent Labs  08/15/16 1146 08/15/16 1208  HGB 13.5 16.0  HCT 41.4 47.0  PLT 301  --   LABPROT 24.1*  --   INR 2.13  --   CREATININE 1.34* 1.20    Estimated Creatinine Clearance: 66.4 mL/min (by C-G formula based on SCr of 1.2 mg/dL).   Medical History: Past Medical History:  Diagnosis Date  . Anticoagulant long-term use   . Arthritis    "probably in my thumbs" (06/26/2012)  . Bifascicular block   . Chronic combined systolic and diastolic CHF (congestive heart failure) ([place])    a. 01/2016 Echo: EF 45-50%, gr2 DD, inflat AK (poor acoustic windows);  b. 07/2016 Echo: EF 25-30%, mild LVH, PASP 73mmHg (pt in Afib).  . Complication of anesthesia    "wake up w/a start; hallucinations" (06/26/2012)  . Critical Aortic Stenosis    a. 03/2003 s/p SJM #25 mech prosthetic AoV;  b. 07/2016 Echo: EF 25-30% (in setting of AF), AoV area 1.72 cm^2 (VTI), 1.75 cm^2 (Vmean).  . Diverticulosis   . Gouty arthritis    "have had it in both feet, ankles, right knee" (06/26/2012)  . History of diverticulitis of colon   . History of kidney stones   . History of pancreatitis   . Hypertension   . Incomplete RBBB   . PAF (paroxysmal atrial fibrillation) (HCC)    a. CHA2DS2VASc = 4-->chronic coumadin in setting of mech AoV;  b. 07/2016 Recurrent AF RVR.    Assessment: 76 yo M with DOE x 2 weeks and cough.  Pharmacy consulted to continue home coumadin.  Coumadin PTA for  [employer]  AVR 03/2003 and afib; home dose 5 mg MWFSun 2.5 mg TTSat last dose 1/22 at 2100 - followed by Dr Rex Kras, INR goal 2-3 per pt report.  INR is therapeutic at 2.13 on home dose. CBC WNL.  No bleeding reported.  Goal of Therapy:  INR  2-3   Plan: continue home dose  Daily INR  Eudelia Bunch, Pharm.D. QP:[id number] 08/15/2016 3:04 PM

## 2016-08-15 NOTE — ED Notes (Signed)
Patient in hall.  Unable to place on cardiac monitoring.

## 2016-08-15 NOTE — Care Management Note (Signed)
Case Management Note  Patient Details  Name: Adrian Lambert MRN: [id number] Date of Birth: 1941-01-21  Subjective/Objective:                  From home with spouse. /75 y/o ? with the above complex PMH including severe AS s/p SJM mech AoV in 03/2003.  He has been on chronic coumadin since. He also has a h/o HTN, obesity, and mild LV dysfxn  EF 40-45% in 01/2016.    Action/Plan: Admit status INPATIENT (CHF, AFIB); anticipate discharge [place].   Expected Discharge Date:   (unsure)               Expected Discharge Plan:  [place]  In-House Referral:  NA  Discharge planning Services  CM Consult  Post Acute Care Choice:    Choice offered to:     DME Arranged:    DME Agency:     HH Arranged:    HH Agency:     Status of Service:  In process, will continue to follow  If discussed at Long Length of Stay Meetings, dates discussed:    Additional Comments:  Fuller Mandril, RN 08/15/2016, 2:27 PM

## 2016-08-15 NOTE — Progress Notes (Signed)
Cardiology Clinic Note   Patient Name: Adrian Lambert Date of Encounter: 08/15/2016  Primary Care Provider:  Gennette Pac, MD Primary Cardiologist:  P. Martinique, MD   Patient Profile    76 y/o ? with the above complex PMH including severe AS s/p SJM mech AoV in 03/2003.  He has been on chronic coumadin since. He also has a h/o HTN, obesity, and mild LV dysfxn  EF 40-45% in 01/2016.   Past Medical History    Past Medical History:  Diagnosis Date  . Anticoagulant long-term use   . Arthritis    "probably in my thumbs" (06/26/2012)  . Bifascicular block   . Chronic combined systolic and diastolic CHF (congestive heart failure) ([place])    a. 01/2016 Echo: EF 45-50%, gr2 DD, inflat AK (poor acoustic windows);  b. 07/2016 Echo: EF 25-30%, mild LVH, PASP 60mmHg (pt in Afib).  . Complication of anesthesia    "wake up w/a start; hallucinations" (06/26/2012)  . Critical Aortic Stenosis    a. 03/2003 s/p SJM #25 mech prosthetic AoV;  b. 07/2016 Echo: EF 25-30% (in setting of AF), AoV area 1.72 cm^2 (VTI), 1.75 cm^2 (Vmean).  . Diverticulosis   . Gouty arthritis    "have had it in both feet, ankles, right knee" (06/26/2012)  . History of diverticulitis of colon   . History of kidney stones   . History of pancreatitis   . Hypertension   . Incomplete RBBB   . PAF (paroxysmal atrial fibrillation) (HCC)    a. CHA2DS2VASc = 4-->chronic coumadin in setting of mech AoV;  b. 07/2016 Recurrent AF RVR.   Past Surgical History:  Procedure Laterality Date  . AORTIC VALVE REPLACEMENT  04-14-2003  DR Servando Snare   #46mm ST JUDE MECHNICAL PROSTHESIS  . CATARACT EXTRACTION W/ INTRAOCULAR LENS  IMPLANT, BILATERAL  RIGHT 2010/  LEFT DEC 2013  . HEMICOLECTOMY  10/06/2003   Laparoscopic-assisted left hemicolectomy for diverticulitis  . LAPAROSCOPIC CHOLECYSTECTOMY  07-23-2006  . PROSTATE BIOPSY  10/09/2012   gleason 3+4=7  . RADIOACTIVE SEED IMPLANT N/A 01/09/2013   Procedure: RADIOACTIVE SEED IMPLANT;   Surgeon: Franchot Gallo, MD;  Location: [facility];  Service: Urology;  Laterality: N/A;  . TRANSTHORACIC ECHOCARDIOGRAM  04-28-2008   MILD LVH / NORMAL LSF/ NORMAL AORTIC VALVE MECHANICAL PROSTHESIS FUNCTION/ MILD LAE/ EF 55-60%    Allergies  Allergies  Allergen Reactions  . Celecoxib Rash    Celebrex    History of Present Illness    76 y/o ? with the above complex PMH including severe AS s/p SJM mech AoV in 03/2003.  He has been on chronic coumadin since. He also has a h/o HTN, obesity, and mild LV dysfxn  EF 40-45% in 01/2016.  About a month ago, he had cough and dyspnea and was dx with bronchitis.  Following outpt primary care treatment with abx and oral steroids, he actually got worse.  He was seen 1/15 and was noted to be in rapid AFib with significant volume overload and ~ 6 lbs wt gain.  He was placed on toprol and lasix and arrangements were made for echo and early f/u.  Echo was done last week and showed LV dysfxn with EF of 25-30%.  Pt says that since last visit, he has had progressive dyspnea with orthopnea, pnd, further wt gain (up 12 more lbs since last visit), increasing abd girth, lower ext edema into thighs, and scrotal swelling.  He has not had c/p or palpitations.  He remains in afib.   Home Medications    Prior to Admission medications   Medication Sig Start Date End Date Taking? Authorizing Provider  allopurinol (ZYLOPRIM) 300 MG tablet Take 300 mg by mouth daily.    Yes Historical Provider, MD  colchicine 0.6 MG tablet Take 0.6 mg by mouth as needed. As needed   Yes Historical Provider, MD  furosemide (LASIX) 40 MG tablet Take 1 tablet (40 mg total) by mouth daily. 08/07/16 11/05/16 Yes Almyra Deforest, PA  metoprolol tartrate (LOPRESSOR) 25 MG tablet Take 0.5 tablets (12.5 mg total) by mouth 2 (two) times daily. 08/07/16 11/05/16 Yes Almyra Deforest, PA  Psyllium (METAMUCIL PO) Take by mouth daily.   Yes Historical Provider, MD  tamsulosin (FLOMAX) 0.4 MG CAPS capsule  Take 0.4 mg by mouth daily.   Yes Historical Provider, MD  tolterodine (DETROL LA) 4 MG 24 hr capsule Take 4 mg by mouth daily. Take 1 cap daily 11/16/14  Yes Historical Provider, MD  warfarin (COUMADIN) 5 MG tablet Take 5 mg by mouth daily.    Yes Historical Provider, MD    Family History    Family History  Problem Relation Age of Onset  . Heart failure Mother   . Diabetes Mother   . Prostate cancer Father     Social History    Social History   Social History  . Marital status: Married    Spouse name: N/A  . Number of children: 4  . 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 Smoking status: Never Smoker  . Smokeless tobacco: Never Used  . Alcohol use 1.8 oz/week    3 Cans of beer per week     Comment:  "may have 2-3 beers on the weekend"  . Drug use: No  . Sexual activity: Not on file   Other Topics Concern  . Not on file   Social History Narrative  . No narrative on file     Review of Systems    General:  No chills, fever, night sweats or weight changes.  Cardiovascular:  No chest pain, +++ dyspnea on exertion, +++ edema, +++ orthopnea, no palpitations,+++ paroxysmal nocturnal dyspnea. Dermatological: No rash, lesions/masses Respiratory: +++ cough, +++ dyspnea Urologic: No hematuria, dysuria Abdominal:   No nausea, vomiting, diarrhea, bright red blood per rectum, melena, or hematemesis +++ bloating/inc abd girth. Neurologic:  No visual changes, wkns, changes in mental status. All other systems reviewed and are otherwise negative except as noted above.  Physical Exam    VS:  BP 124/60   Pulse (!) 111   Ht 5\' 10"  (1.778 m)   Wt 245 lb (111.1 kg)   SpO2 93% Comment: 95% after 5 mins of room air and rest.  BMI 35.15 kg/m  , BMI Body mass index is 35.15 kg/m. GEN: Well nourished, well developed, in no acute distress.  HEENT: normal.  Neck: Supple, no carotid bruits, or masses.  JVD to  earlobe. Cardiac: IR, IR, no murmurs, mech S2, no rubs, or gallops. No clubbing, cyanosis.  3+ bilat le edema to upper thighs.  Radials/DP/PT 2+ and equal bilaterally.  Respiratory:  Respirations regular and unlabored, diminished breath sounds bilat bases. GI: protuberant/distended/edematous, BS + x 4. MS: no deformity or atrophy. Skin: warm and dry, no rash. Neuro:  Strength and sensation are intact. Psych: Normal affect.  Accessory Clinical Findings    ECG -afib,  110, LAD, inc RBBB, ? Prior inf/ant infarcts  Assessment & Plan   1.  Acute on chronic combined systolic/diastolic CHF:  Pt has had progressive dyspnea, orthopnea, edema, inc abd girth, and wt gain in the setting of new afib and LV dysfxn.  Echo last week showed EF 25-30%.  He is up 12 lbs since last weeks visit and notes very little output with lasix 40 daily.  We had a long discussion today, and given massive volume overload and progressive Ss, he will be best served with admission, diuresis, and TEE/DCCV once volume is stable.  Cont  blocker.  Plan to use lasix 80 IV bid. BP was soft last week, better today.  Add ARB if BP room following diuresis.  2.  Persistent Afib:  Pt eval last week due to progressive dyspnea and found to be in afib.  Rate 110 on  blocker therapy.  He does not tolerate this well, with significant volume overload as outlined above.  Admit for diuresis  TEE/DCCV once volume stable.  INR was subRx on 1/15.  Says he was 2.1 @ PCP visit last Friday.  3.  AS s/p Mech AoV:  Stable mechanical valve by recent echo.  Cont coumadin.  4.  HTN:  Stable.  Murray Hodgkins, NP 08/15/2016, 11:11 AM

## 2016-08-15 NOTE — ED Notes (Signed)
Patient is stable and ready to be transport to the floor at this time.  Report was called to 3E RN.  Belongings taken with the patient to the floor.   

## 2016-08-15 NOTE — Patient Instructions (Addendum)
Go to the Emergency Department at [facility]

## 2016-08-15 NOTE — ED Notes (Signed)
CRITICAL VALUE ALERT  Critical value received:  Lactic 3.03  Date of notification:  01/23/20187  Time of notification:  1215  Critical value read back:Yes.    Nurse who received alert:  MG Braulio Conte, RN  MD notified (1st page):  Dr. Winfred Leeds  Time of first page:  1215  MD notified (2nd page):  Time of second page:  Responding MD:  Dr. Winfred Leeds  Time MD responded:  [phone]

## 2016-08-15 NOTE — ED Triage Notes (Signed)
Sob and cough x  3 -4 weeks has been tx for bronchititis

## 2016-08-15 NOTE — H&P (Signed)
Cardiology Clinic Note   Patient Name: Adrian Lambert Date of Encounter: 08/15/2016  Primary Care Provider:  Gennette Pac, MD Primary Cardiologist:  P. Martinique, MD   Patient Profile    76 y/o ? with the above complex PMH including severe AS s/p SJM mech AoV in 03/2003.  He has been on chronic coumadin since. He also has a h/o HTN, obesity, and mild LV dysfxn  EF 40-45% in 01/2016.   Past Medical History        Past Medical History:  Diagnosis Date  . Anticoagulant long-term use   . Arthritis    "probably in my thumbs" (06/26/2012)  . Bifascicular block   . Chronic combined systolic and diastolic CHF (congestive heart failure) ([place])    a. 01/2016 Echo: EF 45-50%, gr2 DD, inflat AK (poor acoustic windows);  b. 07/2016 Echo: EF 25-30%, mild LVH, PASP 23mmHg (pt in Afib).  . Complication of anesthesia    "wake up w/a start; hallucinations" (06/26/2012)  . Critical Aortic Stenosis    a. 03/2003 s/p SJM #25 mech prosthetic AoV;  b. 07/2016 Echo: EF 25-30% (in setting of AF), AoV area 1.72 cm^2 (VTI), 1.75 cm^2 (Vmean).  . Diverticulosis   . Gouty arthritis    "have had it in both feet, ankles, right knee" (06/26/2012)  . History of diverticulitis of colon   . History of kidney stones   . History of pancreatitis   . Hypertension   . Incomplete RBBB   . PAF (paroxysmal atrial fibrillation) (HCC)    a. CHA2DS2VASc = 4-->chronic coumadin in setting of mech AoV;  b. 07/2016 Recurrent AF RVR.   Past Surgical History:  Procedure Laterality Date  . AORTIC VALVE REPLACEMENT  04-14-2003  DR Servando Snare   #44mm ST JUDE MECHNICAL PROSTHESIS  . CATARACT EXTRACTION W/ INTRAOCULAR LENS  IMPLANT, BILATERAL  RIGHT 2010/  LEFT DEC 2013  . HEMICOLECTOMY  10/06/2003   Laparoscopic-assisted left hemicolectomy for diverticulitis  . LAPAROSCOPIC CHOLECYSTECTOMY  07-23-2006  . PROSTATE BIOPSY  10/09/2012   gleason 3+4=7  . RADIOACTIVE SEED IMPLANT N/A 01/09/2013    Procedure: RADIOACTIVE SEED IMPLANT;  Surgeon: Franchot Gallo, MD;  Location: [facility];  Service: Urology;  Laterality: N/A;  . TRANSTHORACIC ECHOCARDIOGRAM  04-28-2008   MILD LVH / NORMAL LSF/ NORMAL AORTIC VALVE MECHANICAL PROSTHESIS FUNCTION/ MILD LAE/ EF 55-60%    Allergies       Allergies  Allergen Reactions  . Celecoxib Rash    Celebrex    History of Present Illness    76 y/o ? with the above complex PMH including severe AS s/p SJM mech AoV in 03/2003.  He has been on chronic coumadin since. He also has a h/o HTN, obesity, and mild LV dysfxn  EF 40-45% in 01/2016.  About a month ago, he had cough and dyspnea and was dx with bronchitis.  Following outpt primary care treatment with abx and oral steroids, he actually got worse.  He was seen 1/15 and was noted to be in rapid AFib with significant volume overload and ~ 6 lbs wt gain.  He was placed on toprol and lasix and arrangements were made for echo and early f/u.  Echo was done last week and showed LV dysfxn with EF of 25-30%.  Pt says that since last visit, he has had progressive dyspnea with orthopnea, pnd, further wt gain (up 12 more lbs since last visit), increasing abd girth, lower ext edema into thighs, and scrotal swelling.  He has not had c/p or palpitations.  He remains in afib.   Home Medications           Prior to Admission medications   Medication Sig Start Date End Date Taking? Authorizing Provider  allopurinol (ZYLOPRIM) 300 MG tablet Take 300 mg by mouth daily.    Yes Historical Provider, MD  colchicine 0.6 MG tablet Take 0.6 mg by mouth as needed. As needed   Yes Historical Provider, MD  furosemide (LASIX) 40 MG tablet Take 1 tablet (40 mg total) by mouth daily. 08/07/16 11/05/16 Yes Almyra Deforest, PA  metoprolol tartrate (LOPRESSOR) 25 MG tablet Take 0.5 tablets (12.5 mg total) by mouth 2 (two) times daily. 08/07/16 11/05/16 Yes Almyra Deforest, PA  Psyllium (METAMUCIL PO) Take by mouth daily.    Yes Historical Provider, MD  tamsulosin (FLOMAX) 0.4 MG CAPS capsule Take 0.4 mg by mouth daily.   Yes Historical Provider, MD  tolterodine (DETROL LA) 4 MG 24 hr capsule Take 4 mg by mouth daily. Take 1 cap daily 11/16/14  Yes Historical Provider, MD  warfarin (COUMADIN) 5 MG tablet Take 5 mg by mouth daily.    Yes Historical Provider, MD    Family History         Family History  Problem Relation Age of Onset  . Heart failure Mother   . Diabetes Mother   . Prostate cancer Father     Social History    Social History        Social History  . Marital status: Married    Spouse name: N/A  . Number of children: 4  . 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 Smoking status: Never Smoker  . Smokeless tobacco: Never Used  . Alcohol use 1.8 oz/week    3 Cans of beer per week     Comment:  "may have 2-3 beers on the weekend"  . Drug use: No  . Sexual activity: Not on file       Other Topics Concern  . Not on file      Social History Narrative  . No narrative on file     Review of Systems    General:  No chills, fever, night sweats or weight changes.  Cardiovascular:  No chest pain, +++ dyspnea on exertion, +++ edema, +++ orthopnea, no palpitations,+++ paroxysmal nocturnal dyspnea. Dermatological: No rash, lesions/masses Respiratory: +++ cough, +++ dyspnea Urologic: No hematuria, dysuria Abdominal:   No nausea, vomiting, diarrhea, bright red blood per rectum, melena, or hematemesis +++ bloating/inc abd girth. Neurologic:  No visual changes, wkns, changes in mental status. All other systems reviewed and are otherwise negative except as noted above.  Physical Exam    VS:  BP 124/60   Pulse (!) 111   Ht 5\' 10"  (1.778 m)   Wt 245 lb (111.1 kg)   SpO2 93% Comment: 95% after 5 mins of room air and rest.  BMI 35.15 kg/m  , BMI Body mass index is 35.15  kg/m. GEN: Well nourished, well developed, in no acute distress.  HEENT: normal.  Neck: Supple, no carotid bruits, or masses.  JVD to earlobe. Cardiac: IR, IR, no murmurs, mech S2, no rubs, or gallops. No clubbing, cyanosis.  3+ bilat le edema to upper thighs.  Radials/DP/PT 2+ and equal bilaterally.  Respiratory:  Respirations regular  and unlabored, diminished breath sounds bilat bases. GI: protuberant/distended/edematous, BS + x 4. MS: no deformity or atrophy. Skin: warm and dry, no rash. Neuro:  Strength and sensation are intact. Psych: Normal affect.  Accessory Clinical Findings    ECG -afib, 110, LAD, inc RBBB, ? Prior inf/ant infarcts  Assessment & Plan   1.  Acute on chronic combined systolic/diastolic CHF:  Pt has had progressive dyspnea, orthopnea, edema, inc abd girth, and wt gain in the setting of new afib and LV dysfxn.  Echo last week showed EF 25-30%.  He is up 12 lbs since last weeks visit and notes very little output with lasix 40 daily.  We had a long discussion today, and given massive volume overload and progressive Ss, he will be best served with admission, diuresis, and TEE/DCCV once volume is stable.  Cont ? blocker.  Plan to use lasix 80 IV bid. BP was soft last week, better today.  Add ARB if BP room following diuresis.  2.  Persistent Afib:  Pt eval last week due to progressive dyspnea and found to be in afib.  Rate 110 on ? blocker therapy.  He does not tolerate this well, with significant volume overload as outlined above.  Admit for diuresis  TEE/DCCV once volume stable.  INR was subRx on 1/15.  Says he was 2.1 @ PCP visit last Friday.  3.  AS s/p Mech AoV:  Stable mechanical valve by recent echo.  Cont coumadin.  4.  HTN:  Stable.  Murray Hodgkins, NP 08/15/2016, 11:11 AM   Patient seen and examined. Agree with assessment and plan.  Adrian Lambert is a 76 year old male who is status post mechanical aortic valve replacement for severe aortic  stenosis in 2004 and has been on chronic warfarin anticoagulation.  Previously, ejection fraction was in the 40-45% range in July 2017.  He has recently developed symptoms consistent with acute on chronic combined systolic and diastolic heart failure in the setting of new onset atrial fibrillation.  A repeat echo Doppler study one week ago showed reduced LV function now at 25-30%.  He has been on diuretic therapy with minimal benefit.  He agree presented today complaining of progressive dyspnea, PND and orthopnea.  He will be admitted for IV diuresis.  We will plan to initiate ARB therapy in light of his LV dysfunction.  He is INR was subtherapeutic.  One week ago which had improved to 2.1.  Following diuresis, consider TEE guided DC cardioversion once volume status is stable.  If renal function remained stable with his LV dysfunction.  He may be a candidate for future ARB/neprilysin inhibition once he is stable.   Troy Sine, MD, [facility] 08/15/2016 6:39 PM

## 2016-08-15 NOTE — ED Provider Notes (Signed)
[facility] DEPT Provider Note   CSN: [id number] Arrival date & time: 08/15/16  1134     History   Chief Complaint Chief Complaint  Patient presents with  . Shortness of Breath  . Chest Pain    HPI Adrian Lambert is a 76 y.o. male.  76 yo M with a cc of sob. Going on for past month.  Occurring with lower extremity swelling.  Saw cardiology started on lasix, no noted improvement.    The history is provided by the patient.  Shortness of Breath  This is a new problem. The average episode lasts 1 month. The problem occurs continuously.The current episode started more than 1 week ago. The problem has been rapidly worsening. Associated symptoms include leg swelling. Pertinent negatives include no fever, no headaches, no chest pain, no vomiting, no abdominal pain and no rash. He has tried nothing for the symptoms. The treatment provided no relief. Associated medical issues include heart failure.  Chest Pain   Associated symptoms include shortness of breath. Pertinent negatives include no abdominal pain, no fever, no headaches, no palpitations and no vomiting.    Past Medical History:  Diagnosis Date  . Anticoagulant long-term use   . Arthritis    "probably in my thumbs" (06/26/2012)  . Bifascicular block   . Chronic combined systolic and diastolic CHF (congestive heart failure) ([place])    a. 01/2016 Echo: EF 45-50%, gr2 DD, inflat AK (poor acoustic windows);  b. 07/2016 Echo: EF 25-30%, mild LVH, PASP 12mmHg (pt in Afib).  . Complication of anesthesia    "wake up w/a start; hallucinations" (06/26/2012)  . Critical Aortic Stenosis    a. 03/2003 s/p SJM #25 mech prosthetic AoV;  b. 07/2016 Echo: EF 25-30% (in setting of AF), AoV area 1.72 cm^2 (VTI), 1.75 cm^2 (Vmean).  . Diverticulosis   . Gouty arthritis    "have had it in both feet, ankles, right knee" (06/26/2012)  . History of diverticulitis of colon   . History of kidney stones   . History of pancreatitis   . Hypertension   .  Incomplete RBBB   . PAF (paroxysmal atrial fibrillation) (HCC)    a. CHA2DS2VASc = 4-->chronic coumadin in setting of mech AoV;  b. 07/2016 Recurrent AF RVR.    Patient Active Problem List   Diagnosis Date Noted  . Acute on chronic combined systolic and diastolic CHF, NYHA class 3 ([place]) 08/15/2016  . Prostate cancer ([place]) 11/26/2012  . Cellulitis 06/26/2012  . S/P AVR (aortic valve replacement) 06/28/2011  . History of aortic stenosis 06/28/2011  . Bifascicular block 06/28/2011  . Essential hypertension 11/04/2007  . RHINITIS 11/04/2007  . GERD 11/04/2007  . COUGH 11/04/2007    Past Surgical History:  Procedure Laterality Date  . AORTIC VALVE REPLACEMENT  04-14-2003  DR Servando Snare   #36mm [facility] JUDE MECHNICAL PROSTHESIS  . CATARACT EXTRACTION W/ INTRAOCULAR LENS  IMPLANT, BILATERAL  RIGHT 2010/  LEFT DEC 2013  . HEMICOLECTOMY  10/06/2003   Laparoscopic-assisted left hemicolectomy for diverticulitis  . LAPAROSCOPIC CHOLECYSTECTOMY  07-23-2006  . PROSTATE BIOPSY  10/09/2012   gleason 3+4=7  . RADIOACTIVE SEED IMPLANT N/A 01/09/2013   Procedure: RADIOACTIVE SEED IMPLANT;  Surgeon: Franchot Gallo, MD;  Location: [facility];  Service: Urology;  Laterality: N/A;  . TRANSTHORACIC ECHOCARDIOGRAM  04-28-2008   MILD LVH / NORMAL LSF/ NORMAL AORTIC VALVE MECHANICAL PROSTHESIS FUNCTION/ MILD LAE/ EF 55-60%       Home Medications    Prior to  Admission medications   Medication Sig Start Date End Date Taking? Authorizing Provider  allopurinol (ZYLOPRIM) 300 MG tablet Take 300 mg by mouth daily.    Yes Historical Provider, MD  colchicine 0.6 MG tablet Take 0.6 mg by mouth as needed (for gout).    Yes Historical Provider, MD  furosemide (LASIX) 40 MG tablet Take 1 tablet (40 mg total) by mouth daily. 08/07/16 11/05/16 Yes Almyra Deforest, PA  metoprolol tartrate (LOPRESSOR) 25 MG tablet Take 0.5 tablets (12.5 mg total) by mouth 2 (two) times daily. 08/07/16 11/05/16 Yes Almyra Deforest, PA    Psyllium (METAMUCIL PO) Take 2 capsules by mouth 2 (two) times daily.    Yes Historical Provider, MD  tamsulosin (FLOMAX) 0.4 MG CAPS capsule Take 0.4 mg by mouth every other day.    Yes Historical Provider, MD  tolterodine (DETROL LA) 4 MG 24 hr capsule Take 4 mg by mouth daily.  11/16/14  Yes Historical Provider, MD  warfarin (COUMADIN) 5 MG tablet Take 2.5-5 mg by mouth See admin instructions. Take 5 mg by mouth Monday, Wednesday, Friday and Sunday. Take 2.5 mg by mouth on all other days.   Yes Historical Provider, MD    Family History Family History  Problem Relation Age of Onset  . Heart failure Mother   . Diabetes Mother   . Prostate cancer Father     Social History Social History  Substance Use Topics  . Smoking status: Never Smoker  . Smokeless tobacco: Never Used  . Alcohol use 1.8 oz/week    3 Cans of beer per week     Comment:  "may have 2-3 beers on the weekend"     Allergies   Celecoxib   Review of Systems Review of Systems  Constitutional: Negative for chills and fever.  HENT: Negative for congestion and facial swelling.   Eyes: Negative for discharge and visual disturbance.  Respiratory: Positive for shortness of breath.   Cardiovascular: Positive for leg swelling. Negative for chest pain and palpitations.  Gastrointestinal: Negative for abdominal pain, diarrhea and vomiting.  Musculoskeletal: Negative for arthralgias and myalgias.  Skin: Negative for color change and rash.  Neurological: Negative for tremors, syncope and headaches.  Psychiatric/Behavioral: Negative for confusion and dysphoric mood.     Physical Exam Updated Vital Signs BP 110/75 (BP Location: Right Arm)   Pulse (!) 110   Temp 97.6 F (36.4 C) (Oral)   Resp 20   Ht 5\' 10"  (1.778 m)   Wt 237 lb 12.8 oz (107.9 kg) Comment: a scale  SpO2 97%   BMI 34.12 kg/m   Physical Exam  Constitutional: He is oriented to person, place, and time. He appears well-developed and well-nourished.   HENT:  Head: Normocephalic and atraumatic.  JVD up to the jaw  Eyes: EOM are normal. Pupils are equal, round, and reactive to light.  Neck: Normal range of motion. Neck supple. No JVD present.  Cardiovascular: Normal rate and regular rhythm.  Exam reveals no gallop and no friction rub.   No murmur heard. Pulmonary/Chest: He is in respiratory distress. He has no wheezes.  Diminished diffusely  Abdominal: He exhibits no distension and no mass. There is no tenderness. There is no rebound and no guarding.  Musculoskeletal: Normal range of motion. He exhibits edema and tenderness (4+ to the abdomen).  Neurological: He is alert and oriented to person, place, and time.  Skin: No rash noted. No pallor.  Psychiatric: He has a normal mood and affect. His behavior  is normal.  Nursing note and vitals reviewed.    ED Treatments / Results  Labs (all labs ordered are listed, but only abnormal results are displayed) Labs Reviewed  BASIC METABOLIC PANEL - Abnormal; Notable for the following:       Result Value   Sodium 127 (*)    Chloride 90 (*)    Glucose, Bld 143 (*)    BUN 25 (*)    Creatinine, Ser 1.34 (*)    GFR calc non Af Amer 50 (*)    GFR calc Af Amer 58 (*)    All other components within normal limits  CBC - Abnormal; Notable for the following:    WBC 13.2 (*)    RBC 6.96 (*)    MCV 59.5 (*)    MCH 19.4 (*)    RDW 17.0 (*)    All other components within normal limits  PROTIME-INR - Abnormal; Notable for the following:    Prothrombin Time 24.1 (*)    All other components within normal limits  BRAIN NATRIURETIC PEPTIDE - Abnormal; Notable for the following:    B Natriuretic Peptide 630.8 (*)    All other components within normal limits  CBC WITH DIFFERENTIAL/PLATELET - Abnormal; Notable for the following:    WBC 12.2 (*)    RBC 6.76 (*)    MCV 59.5 (*)    MCH 19.5 (*)    RDW 17.1 (*)    Neutro Abs 10.1 (*)    All other components within normal limits  COMPREHENSIVE  METABOLIC PANEL - Abnormal; Notable for the following:    Sodium 128 (*)    Chloride 90 (*)    Glucose, Bld 130 (*)    BUN 24 (*)    Creatinine, Ser 1.29 (*)    Total Protein 6.3 (*)    Total Bilirubin 1.5 (*)    GFR calc non Af Amer 53 (*)    All other components within normal limits  BASIC METABOLIC PANEL - Abnormal; Notable for the following:    Sodium 130 (*)    Chloride 92 (*)    Glucose, Bld 116 (*)    BUN 24 (*)    GFR calc non Af Amer 59 (*)    All other components within normal limits  I-STAT CHEM 8, ED - Abnormal; Notable for the following:    Sodium 126 (*)    Chloride 92 (*)    BUN 29 (*)    Glucose, Bld 147 (*)    Calcium, Ion 1.00 (*)    All other components within normal limits  I-STAT CG4 LACTIC ACID, ED - Abnormal; Notable for the following:    Lactic Acid, Venous 3.03 (*)    All other components within normal limits  MAGNESIUM  TSH  PROTIME-INR  I-STAT TROPOININ, ED    EKG  EKG Interpretation  Date/Time:  Tuesday August 15 2016 11:40:28 EST Ventricular Rate:  110 PR Interval:    QRS Duration: 118 QT Interval:  348 QTC Calculation: 470 R Axis:   -51 Text Interpretation:  Atrial fibrillation with rapid ventricular response with premature ventricular or aberrantly conducted complexes Left axis deviation Right bundle branch block Inferior infarct , age undetermined Anterior infarct , age undetermined Abnormal ECG No significant change since last tracing Confirmed by Maleta Pacha MD, Quillian Quince [id number]) on 08/15/2016 11:49:09 AM Also confirmed by Tyrone Nine MD, DANIEL [id number]), editor Stout CT, Leda Gauze [phone])  on 08/15/2016 12:46:02 PM       Radiology Dg Chest 2  View  Result Date: 08/16/2016 CLINICAL DATA:  CHF, cough, hypertension EXAM: CHEST  2 VIEW COMPARISON:  PA and lateral chest x-ray of August 15, 2016 FINDINGS: The lungs are reasonably well inflated. The interstitial markings remain increased diffusely with areas of patchy density noted bilaterally. There small  pleural effusions bilaterally. The cardiac silhouette is enlarged. The pulmonary vascularity is slightly less engorged. The patient has undergone previous aortic valve replacement. There is calcification in the wall of the aortic arch. The observed bony thorax exhibits no acute abnormality. IMPRESSION: CHF with interstitial edema and small bilateral pleural effusions little change since yesterday's study. Thoracic aortic atherosclerosis. Electronically Signed   By: David  Martinique M.D.   On: 08/16/2016 08:24   Dg Chest 2 View  Result Date: 08/15/2016 CLINICAL DATA:  3-4 weeks of cough. Treated for bronchitis. History of CHF, aortic stenosis, and atrial fibrillation. EXAM: CHEST  2 VIEW COMPARISON:  Chest x-ray of June 12, 2016 FINDINGS: The lungs are reasonably well inflated. The interstitial markings are chronically increased but have become more conspicuous since the previous study. The cardiac silhouette is enlarged. The pulmonary vascularity is more congested centrally. There are small bilateral pleural effusions. The patient has undergone previous aortic valve replacement. There is calcification in the wall of the aortic arch. The bony thorax exhibits no acute abnormality. IMPRESSION: CHF with interstitial edema and small bilateral pleural effusions. There may be superimposed pneumonia in the appropriate clinical setting. Electronically Signed   By: David  [place] M.D.   On: 08/15/2016 12:34    Procedures Procedures (including critical care time)  Medications Ordered in ED Medications  acetaminophen (TYLENOL) tablet 650 mg (not administered)  ondansetron (ZOFRAN) injection 4 mg (not administered)  0.9 %  sodium chloride infusion (not administered)  sodium chloride flush (NS) 0.9 % injection 3 mL (3 mLs Intravenous Given 08/16/16 1000)  sodium chloride flush (NS) 0.9 % injection 3 mL (not administered)  furosemide (LASIX) injection 80 mg (80 mg Intravenous Given 08/16/16 0621)  allopurinol  (ZYLOPRIM) tablet 300 mg (300 mg Oral Given 08/16/16 0802)  metoprolol succinate (TOPROL-XL) 24 hr tablet 25 mg (25 mg Oral Given 08/16/16 0802)  warfarin (COUMADIN) tablet 5 mg (not administered)    And  warfarin (COUMADIN) tablet 2.5 mg (2.5 mg Oral Given 08/15/16 1658)  Warfarin - Pharmacist Dosing Inpatient (1 each Does not apply Given 08/15/16 1658)  tamsulosin (FLOMAX) capsule 0.4 mg (not administered)  fesoterodine (TOVIAZ) tablet 4 mg (4 mg Oral Given 08/16/16 0802)  guaiFENesin-dextromethorphan (ROBITUSSIN DM) 100-10 MG/5ML syrup 5 mL (5 mLs Oral Given 08/15/16 2201)  furosemide (LASIX) injection 40 mg (40 mg Intravenous Given 08/15/16 1239)  furosemide (LASIX) injection 40 mg (40 mg Intravenous Given 08/15/16 1424)     Initial Impression / Assessment and Plan / ED Course  I have reviewed the triage vital signs and the nursing notes.  Pertinent labs & imaging results that were available during my care of the patient were reviewed by me and considered in my medical decision making (see chart for details).     76 yo M with sob and leg swelling.  Also with cough.  Sent from cards clinic for admission.  Given lasix.  Admit.   The patients results and plan were reviewed and discussed.   Any x-rays performed were independently reviewed by myself.   Differential diagnosis were considered with the presenting HPI.  Medications  acetaminophen (TYLENOL) tablet 650 mg (not administered)  ondansetron (ZOFRAN) injection 4 mg (not administered)  0.9 %  sodium chloride infusion (not administered)  sodium chloride flush (NS) 0.9 % injection 3 mL (3 mLs Intravenous Given 08/16/16 1000)  sodium chloride flush (NS) 0.9 % injection 3 mL (not administered)  furosemide (LASIX) injection 80 mg (80 mg Intravenous Given 08/16/16 0621)  allopurinol (ZYLOPRIM) tablet 300 mg (300 mg Oral Given 08/16/16 0802)  metoprolol succinate (TOPROL-XL) 24 hr tablet 25 mg (25 mg Oral Given 08/16/16 0802)  warfarin  (COUMADIN) tablet 5 mg (not administered)    And  warfarin (COUMADIN) tablet 2.5 mg (2.5 mg Oral Given 08/15/16 1658)  Warfarin - Pharmacist Dosing Inpatient (1 each Does not apply Given 08/15/16 1658)  tamsulosin (FLOMAX) capsule 0.4 mg (not administered)  fesoterodine (TOVIAZ) tablet 4 mg (4 mg Oral Given 08/16/16 0802)  guaiFENesin-dextromethorphan (ROBITUSSIN DM) 100-10 MG/5ML syrup 5 mL (5 mLs Oral Given 08/15/16 2201)  furosemide (LASIX) injection 40 mg (40 mg Intravenous Given 08/15/16 1239)  furosemide (LASIX) injection 40 mg (40 mg Intravenous Given 08/15/16 1424)    Vitals:   08/15/16 2040 08/16/16 0537 08/16/16 0539 08/16/16 0741  BP: 122/72 (!) 125/98  110/75  Pulse: (!) 102 87  (!) 110  Resp: 20 20  20   Temp: 97.7 F (36.5 C) 97.3 F (36.3 C)  97.6 F (36.4 C)  TempSrc: Oral Oral  Oral  SpO2: 97% 93% 97% 97%  Weight:   237 lb 12.8 oz (107.9 kg)   Height:        Final diagnoses:  Acute on chronic systolic congestive heart failure (HCC)  Acute on chronic combined systolic and diastolic CHF, NYHA class 3 (HCC)  CHF (congestive heart failure) (HCC)      Final Clinical Impressions(s) / ED Diagnoses   Final diagnoses:  Acute on chronic systolic congestive heart failure (HCC)  Acute on chronic combined systolic and diastolic CHF, NYHA class 3 (HCC)  CHF (congestive heart failure) [facility])    New Prescriptions Current Discharge Medication List       Deno Etienne, DO 08/16/16 [id number]

## 2016-08-16 ENCOUNTER — Inpatient Hospital Stay (HOSPITAL_COMMUNITY): Payer: Medicare Other

## 2016-08-16 DIAGNOSIS — I4891 Unspecified atrial fibrillation: Secondary | ICD-10-CM | POA: Diagnosis present

## 2016-08-16 LAB — BASIC METABOLIC PANEL
ANION GAP: 10 (ref 5–15)
BUN: 24 mg/dL — ABNORMAL HIGH (ref 6–20)
CALCIUM: 9 mg/dL (ref 8.9–10.3)
CO2: 28 mmol/L (ref 22–32)
Chloride: 92 mmol/L — ABNORMAL LOW (ref 101–111)
Creatinine, Ser: 1.17 mg/dL (ref 0.61–1.24)
GFR calc Af Amer: >60 mL/min (ref >60)
GFR, EST NON AFRICAN AMERICAN: 59 mL/min — AB (ref >60)
GLUCOSE: 116 mg/dL — AB (ref 65–99)
POTASSIUM: 4.6 mmol/L (ref 3.5–5.1)
SODIUM: 130 mmol/L — AB (ref 135–145)

## 2016-08-16 LAB — PROTIME-INR
INR: 2.27
PROTHROMBIN TIME: 25.5 s — AB (ref 11.4–15.2)

## 2016-08-16 MED ORDER — AMIODARONE HCL 200 MG PO TABS
400.0000 mg | ORAL_TABLET | Freq: Two times a day (BID) | ORAL | Status: DC
  Administered 2016-08-16 – 2016-08-22 (×13): 400 mg via ORAL
  Filled 2016-08-16 (×13): qty 2

## 2016-08-16 MED ORDER — METOLAZONE 2.5 MG PO TABS
2.5000 mg | ORAL_TABLET | Freq: Every day | ORAL | Status: DC
  Administered 2016-08-16: 2.5 mg via ORAL
  Filled 2016-08-16 (×2): qty 1

## 2016-08-16 NOTE — Progress Notes (Addendum)
Patient complaining of being short of breath upon awakening this morning. Patient placed on 2L on [place]. Patient O2 sats 93%. Patient lung sounds clear/diminished bilaterally. Cardiology Fellow Wynonia Lawman paged and notified of patient's condition. Will continue to monitor. Verbal order received to give 8am dose of lasix now. Order initiated. Will continue to monitor patient.

## 2016-08-16 NOTE — Progress Notes (Signed)
Patient complained of being short of breath after using the bathroom. I checked his oxygen saturation levels= 97%. Instructed patient to take slow, deep, breaths in order to slow his breathing and moved the urinal closer to his bed.

## 2016-08-16 NOTE — Progress Notes (Signed)
Progress Note  Patient Name: Adrian Lambert Date of Encounter: 08/16/2016  Primary Cardiologist: Dr. [place]   Subjective   Still with orthopnea, PND. Feels SOB. Denies chest pain .   Inpatient Medications    Scheduled Meds: . allopurinol  300 mg Oral Daily  . fesoterodine  4 mg Oral Daily  . furosemide  80 mg Intravenous BID  . metoprolol succinate  25 mg Oral Daily  . sodium chloride flush  3 mL Intravenous Q12H  . [START ON 08/17/2016] tamsulosin  0.4 mg Oral QODAY  . warfarin  5 mg Oral Once per day on Sun Mon Wed Fri   And  . warfarin  2.5 mg Oral Once per day on Tue Thu Sat  . Warfarin - Pharmacist Dosing Inpatient   Does not apply q1800   Continuous Infusions:  PRN Meds: sodium chloride, acetaminophen, guaiFENesin-dextromethorphan, ondansetron (ZOFRAN) IV, sodium chloride flush   Vital Signs    Vitals:   08/15/16 2040 08/16/16 0537 08/16/16 0539 08/16/16 0741  BP: 122/72 (!) 125/98  110/75  Pulse: (!) 102 87  (!) 110  Resp: 20 20  20   Temp: 97.7 F (36.5 C) 97.3 F (36.3 C)  97.6 F (36.4 C)  TempSrc: Oral Oral  Oral  SpO2: 97% 93% 97% 97%  Weight:   237 lb 12.8 oz (107.9 kg)   Height:        Intake/Output Summary (Last 24 hours) at 08/16/16 0933 Last data filed at 08/16/16 [id number]  Gross per 24 hour  Intake              655 ml  Output             1600 ml  Net             -945 ml   Filed Weights   08/15/16 1613 08/16/16 0539  Weight: 236 lb 11.2 oz (107.4 kg) 237 lb 12.8 oz (107.9 kg)    Telemetry     Afib, rates in the 100's- Personally Reviewed  ECG    Afib, RBBB- Personally Reviewed  Physical Exam   GEN: No acute distress.  Neck: JVD to jaw.  Cardiac: IRRR, no murmurs, rubs, or gallops.  Respiratory: Diminished throughout. Crackles in bases.  GI: Soft, nontender, non-distended  MS: 2+ pretibial edema; No deformity. Neuro:  AAOx3. Psych: Normal affect  Labs    Chemistry Recent Labs Lab 08/15/16 1146 08/15/16 1208  08/15/16 1419 08/16/16 0436  NA 127* 126* 128* 130*  K 4.6 4.6 4.2 4.6  CL 90* 92* 90* 92*  CO2 24  --  25 28  GLUCOSE 143* 147* 130* 116*  BUN 25* 29* 24* 24*  CREATININE 1.34* 1.20 1.29* 1.17  CALCIUM 9.1  --  9.0 9.0  PROT  --   --  6.3*  --   ALBUMIN  --   --  3.9  --   AST  --   --  35  --   ALT  --   --  34  --   ALKPHOS  --   --  89  --   BILITOT  --   --  1.5*  --   GFRNONAA 50*  --  53* 59*  GFRAA 58*  --  >60 >60  ANIONGAP 13  --  13 10     Hematology Recent Labs Lab 08/15/16 1146 08/15/16 1208 08/15/16 1419  WBC 13.2*  --  12.2*  RBC 6.96*  --  6.76*  HGB 13.5 16.0 13.2  HCT 41.4 47.0 40.2  MCV 59.5*  --  59.5*  MCH 19.4*  --  19.5*  MCHC 32.6  --  32.8  RDW 17.0*  --  17.1*  PLT 301  --  283     BNP Recent Labs Lab 08/15/16 1147  BNP 630.8*       Radiology    Dg Chest 2 View  Result Date: 08/16/2016 CLINICAL DATA:  CHF, cough, hypertension EXAM: CHEST  2 VIEW COMPARISON:  PA and lateral chest x-ray of August 15, 2016 FINDINGS: The lungs are reasonably well inflated. The interstitial markings remain increased diffusely with areas of patchy density noted bilaterally. There small pleural effusions bilaterally. The cardiac silhouette is enlarged. The pulmonary vascularity is slightly less engorged. The patient has undergone previous aortic valve replacement. There is calcification in the wall of the aortic arch. The observed bony thorax exhibits no acute abnormality. IMPRESSION: CHF with interstitial edema and small bilateral pleural effusions little change since yesterday's study. Thoracic aortic atherosclerosis. Electronically Signed   By: David  [place] M.D.   On: 08/16/2016 08:24   Dg Chest 2 View  Result Date: 08/15/2016 CLINICAL DATA:  3-4 weeks of cough. Treated for bronchitis. History of CHF, aortic stenosis, and atrial fibrillation. EXAM: CHEST  2 VIEW COMPARISON:  Chest x-ray of June 12, 2016 FINDINGS: The lungs are reasonably well  inflated. The interstitial markings are chronically increased but have become more conspicuous since the previous study. The cardiac silhouette is enlarged. The pulmonary vascularity is more congested centrally. There are small bilateral pleural effusions. The patient has undergone previous aortic valve replacement. There is calcification in the wall of the aortic arch. The bony thorax exhibits no acute abnormality. IMPRESSION: CHF with interstitial edema and small bilateral pleural effusions. There may be superimposed pneumonia in the appropriate clinical setting. Electronically Signed   By: David  [place] M.D.   On: 08/15/2016 12:34    Cardiac Studies   Echocardiography Study Conclusions  - Left ventricle: The cavity size was normal. Wall thickness was   increased in a pattern of mild LVH. Systolic function was   severely reduced. The estimated ejection fraction was in the   range of 25% to 30%. - Aortic valve: A bioprosthesis was present. Valve area (VTI): 1.72   cm^2. Valve area (Vmax): 1.74 cm^2. Valve area (Vmean): 1.75   cm^2. - Right atrium: The atrium was mildly dilated. - Pulmonary arteries: Systolic pressure was mildly increased. PA   peak pressure: 33 mm Hg (S).  Impressions:  - Poor quality echo.   The LV endocardium is not well seen. With contrast, the LV   function is seen to be severely depressed.   The prosthetic AV is not well seen .   Patient Profile     76 y.o. male with a past medical history of severe AS s/p SJM mech AoV in 03/2003. He has been on chronic coumadin since. He also has a h/o HTN, obesity, and mild LV dysfxn EF 40-45% in 01/2016. Found to be in Afib and volume overloaded, admitted for diuresis and TEE/DCCV  Assessment & Plan    1. Acute on chronic combined systolic/diastolic CHF: Pt has had progressive dyspnea, orthopnea, edema, inc abd girth, and wt gain in the setting of new afib and LV dysfxn.   Echo last week showed EF 25-30%. He is up  12 lbs since last weeks office visit and notes very little output with lasix 40 daily.  Admitted on 08/16/16, out 1L. On IV Lasix 80mg  BID. Will add 2.5 metolazone today.   Continue metoprolol, no ACE-I in the setting of chronic renal disease.    2. Persistent Afib: Pt eval last week due to progressive dyspnea and found to be in afib. Rate 110 on ?blocker therapy. He does not tolerate this well, with significant volume overload as outlined above. TEE/DCCV on Friday. Will start antiarrhythmic therapy as well with Amiodarone 400mg  BID today to increase chance of NSR.   INR is 2.13.   3. AS s/p Mech AoV: Stable mechanical valve by recent echo. Cont coumadin.  4. HTN: Stable.    Signed, Arbutus Leas, NP  08/16/2016, 9:33 AM   Patient seen and examined and history reviewed. Agree with above findings and plan. 76 yo WM admitted with progressive CHF. Started with ? Bronchitis but did not improve with antibiotic therapy. Later found to be in Afib. He has been on chronic coumadin but one recent INR down to 1.5. Other levels therapeutic. He is diuresing some. Will give a one time dose of metolazone today. Continue IV lasix. Will start oral load of amiodarone. Plan TEE guided DCCV on Friday. Need to restrict free water with hyponatremia.   Sheala Dosh [place], [place] 08/16/2016 10:02 AM

## 2016-08-17 DIAGNOSIS — I10 Essential (primary) hypertension: Secondary | ICD-10-CM

## 2016-08-17 LAB — BASIC METABOLIC PANEL
Anion gap: 11 (ref 5–15)
BUN: 32 mg/dL — AB (ref 6–20)
CHLORIDE: 89 mmol/L — AB (ref 101–111)
CO2: 27 mmol/L (ref 22–32)
CREATININE: 1.4 mg/dL — AB (ref 0.61–1.24)
Calcium: 8.9 mg/dL (ref 8.9–10.3)
GFR calc Af Amer: 55 mL/min — ABNORMAL LOW (ref >60)
GFR calc non Af Amer: 48 mL/min — ABNORMAL LOW (ref >60)
Glucose, Bld: 129 mg/dL — ABNORMAL HIGH (ref 65–99)
POTASSIUM: 3.8 mmol/L (ref 3.5–5.1)
Sodium: 127 mmol/L — ABNORMAL LOW (ref 135–145)

## 2016-08-17 LAB — PROTIME-INR
INR: 2.53
Prothrombin Time: 27.7 seconds — ABNORMAL HIGH (ref 11.4–15.2)

## 2016-08-17 MED ORDER — MAGNESIUM HYDROXIDE 400 MG/5ML PO SUSP
30.0000 mL | Freq: Once | ORAL | Status: AC
  Administered 2016-08-17: 30 mL via ORAL
  Filled 2016-08-17: qty 30

## 2016-08-17 MED ORDER — FUROSEMIDE 10 MG/ML IJ SOLN
8.0000 mg/h | INTRAVENOUS | Status: DC
  Administered 2016-08-17: 8 mg/h via INTRAVENOUS
  Filled 2016-08-17 (×4): qty 25

## 2016-08-17 NOTE — Progress Notes (Signed)
Nutrition Education Note  RD consulted for nutrition education regarding new onset CHF.  RD provided "Low Sodium Nutrition Therapy" handout from the [employer]. Reviewed patient's dietary recall. Pt reports that he already doesn't use any added salt and he rarely eats out. His wife cooks for him at home. Provided examples on ways to decrease sodium intake in diet. Discouraged intake of processed foods. Encouraged fresh fruits and vegetables as well as whole grain sources of carbohydrates to maximize fiber intake. Discussed tips when eating out. Discussed reading nutrition labels and sodium guidelines per serving.   RD discussed why it is important for patient to adhere to diet recommendations, and emphasized the role of fluids, foods to avoid, and importance of weighing self daily. Teach back method used.  Expect good compliance. Pt states that he can eliminated american cheese and eat swiss cheese instead. He reports plans to check nutrition labels of snack foods that he eats at home and find lower sodium alternatives if necessary.   Body mass index is 33.85 kg/m. Pt meets criteria for Obesity based on current BMI. Pt states that he usually weighs about 223 lbs. (Current weight is 235 lbs).   Current diet order is Heart healthy, patient is consuming approximately 100% of meals at this time. Labs and medications reviewed. No further nutrition interventions warranted at this time. RD contact information provided. If additional nutrition issues arise, please re-consult RD.   Scarlette Ar RD, CSP, LDN Inpatient Clinical Dietitian Pager: [phone] After Hours Pager: [phone]

## 2016-08-17 NOTE — Progress Notes (Addendum)
Progress Note  Patient Name: Adrian Lambert Date of Encounter: 08/17/2016  Primary Cardiologist: Dr. Martinique   Subjective   Sitting up, feels ok this morning. Breathing better but not as baseline.   Inpatient Medications    Scheduled Meds: . allopurinol  300 mg Oral Daily  . amiodarone  400 mg Oral BID  . fesoterodine  4 mg Oral Daily  . furosemide  80 mg Intravenous BID  . metolazone  2.5 mg Oral Daily  . metoprolol succinate  25 mg Oral Daily  . sodium chloride flush  3 mL Intravenous Q12H  . tamsulosin  0.4 mg Oral QODAY  . warfarin  5 mg Oral Once per day on Sun Mon Wed Fri   And  . warfarin  2.5 mg Oral Once per day on Tue Thu Sat  . Warfarin - Pharmacist Dosing Inpatient   Does not apply q1800   Continuous Infusions:  PRN Meds: sodium chloride, acetaminophen, guaiFENesin-dextromethorphan, ondansetron (ZOFRAN) IV, sodium chloride flush   Vital Signs    Vitals:   08/16/16 0741 08/16/16 2014 08/17/16 0554 08/17/16 0751  BP: 110/75 105/80 121/77 109/88  Pulse: (!) 110 97 85 87  Resp: 20 20 20 20   Temp: 97.6 F (36.4 C) 97.8 F (36.6 C) 97.5 F (36.4 C) 98.3 F (36.8 C)  TempSrc: Oral Oral Oral Oral  SpO2: 97% 97% 97% 96%  Weight:   235 lb 14.4 oz (107 kg)   Height:        Intake/Output Summary (Last 24 hours) at 08/17/16 0836 Last data filed at 08/17/16 0600  Gross per 24 hour  Intake             1283 ml  Output             1550 ml  Net             -267 ml   Filed Weights   08/15/16 1613 08/16/16 0539 08/17/16 0554  Weight: 236 lb 11.2 oz (107.4 kg) 237 lb 12.8 oz (107.9 kg) 235 lb 14.4 oz (107 kg)    Telemetry    Afib - Personally Reviewed  ECG    Afib, RBB- Personally Reviewed  Physical Exam   GEN: No acute distress.  Neck: No JVD Cardiac: IRRR, no murmurs, rubs, or gallops.  Respiratory:Crackles in bilateral lower lobes GI: Soft, nontender, taught MS: No edema; No deformity. Neuro:  AAOx3. Psych: Normal affect  Labs      Chemistry Recent Labs Lab 08/15/16 1419 08/16/16 0436 08/17/16 0530  NA 128* 130* 127*  K 4.2 4.6 3.8  CL 90* 92* 89*  CO2 25 28 27   GLUCOSE 130* 116* 129*  BUN 24* 24* 32*  CREATININE 1.29* 1.17 1.40*  CALCIUM 9.0 9.0 8.9  PROT 6.3*  --   --   ALBUMIN 3.9  --   --   AST 35  --   --   ALT 34  --   --   ALKPHOS 89  --   --   BILITOT 1.5*  --   --   GFRNONAA 53* 59* 48*  GFRAA >60 >60 55*  ANIONGAP 13 10 11      Hematology Recent Labs Lab 08/15/16 1146 08/15/16 1208 08/15/16 1419  WBC 13.2*  --  12.2*  RBC 6.96*  --  6.76*  HGB 13.5 16.0 13.2  HCT 41.4 47.0 40.2  MCV 59.5*  --  59.5*  MCH 19.4*  --  19.5*  MCHC 32.6  --  32.8  RDW 17.0*  --  17.1*  PLT 301  --  283    Cardiac EnzymesNo results for input(s): TROPONINI in the last 168 hours. No results for input(s): TROPIPOC in the last 168 hours.   BNP Recent Labs Lab 08/15/16 1147  BNP 630.8*     DDimer No results for input(s): DDIMER in the last 168 hours.   Radiology    Dg Chest 2 View  Result Date: 08/16/2016 CLINICAL DATA:  CHF, cough, hypertension EXAM: CHEST  2 VIEW COMPARISON:  PA and lateral chest x-ray of August 15, 2016 FINDINGS: The lungs are reasonably well inflated. The interstitial markings remain increased diffusely with areas of patchy density noted bilaterally. There small pleural effusions bilaterally. The cardiac silhouette is enlarged. The pulmonary vascularity is slightly less engorged. The patient has undergone previous aortic valve replacement. There is calcification in the wall of the aortic arch. The observed bony thorax exhibits no acute abnormality. IMPRESSION: CHF with interstitial edema and small bilateral pleural effusions little change since yesterday's study. Thoracic aortic atherosclerosis. Electronically Signed   By: David  Martinique M.D.   On: 08/16/2016 08:24   Dg Chest 2 View  Result Date: 08/15/2016 CLINICAL DATA:  3-4 weeks of cough. Treated for bronchitis. History of  CHF, aortic stenosis, and atrial fibrillation. EXAM: CHEST  2 VIEW COMPARISON:  Chest x-ray of June 12, 2016 FINDINGS: The lungs are reasonably well inflated. The interstitial markings are chronically increased but have become more conspicuous since the previous study. The cardiac silhouette is enlarged. The pulmonary vascularity is more congested centrally. There are small bilateral pleural effusions. The patient has undergone previous aortic valve replacement. There is calcification in the wall of the aortic arch. The bony thorax exhibits no acute abnormality. IMPRESSION: CHF with interstitial edema and small bilateral pleural effusions. There may be superimposed pneumonia in the appropriate clinical setting. Electronically Signed   By: David  Martinique M.D.   On: 08/15/2016 12:34    Cardiac Studies   Echocardiography 08/11/16 Study Conclusions  - Left ventricle: The cavity size was normal. Wall thickness was   increased in a pattern of mild LVH. Systolic function was   severely reduced. The estimated ejection fraction was in the   range of 25% to 30%. - Aortic valve: A bioprosthesis was present. Valve area (VTI): 1.72   cm^2. Valve area (Vmax): 1.74 cm^2. Valve area (Vmean): 1.75   cm^2. - Right atrium: The atrium was mildly dilated. - Pulmonary arteries: Systolic pressure was mildly increased. PA   peak pressure: 33 mm Hg (S).  Impressions:  - Poor quality echo.   The LV endocardium is not well seen. With contrast, the LV   function is seen to be severely depressed.   The prosthetic AV is not well seen .   Patient Profile     76 y.o. male with a past medical history of severe AS s/p SJM mech AoV in 03/2003. He has been on chronic coumadin since. He also has a h/o HTN, obesity, and mild LV dysfxn EF 40-45% in 01/2016. Found to be in Afib and volume overloaded, admitted for diuresis and TEE/DCCV  Assessment & Plan    1. Acute on chronic combined systolic/diastolic CHF: Pt has  had progressive dyspnea, orthopnea, edema, inc abd girth, and wt gain in the setting of new afib and LV dysfxn.   Admitted on 08/16/16, out 1.4L. On IV Lasix 80mg  BID, continue today.   Continue metoprolol, no ACE-I in the setting  of chronic renal disease.   2. Persistent Afib: Pt eval last week due to progressive dyspnea and found to be in afib. Rate 110 on ?blocker therapy. He does not tolerate this well, with significant volume overload as outlined above. TEE/DCCV on Friday. Will start antiarrhythmic therapy as well with Amiodarone 400mg  BID  to increase chance of NSR.   INR is 2.53   3. AS s/p Mech AoV: Stable mechanical valve by recent echo. Cont coumadin.  4. HTN: Stable.  5. Acute on chronic kidney disease stage 3.   Signed, Arbutus Leas, NP  08/17/2016, 8:36 AM   Patient seen and examined and history reviewed. Agree with above findings and plan. Still notes SOB but a little better. Still has significant edema 2-3+. Diuresis is not as brisk as I would like. Creatinine increased and more hyponatremia following metolazone yesterday. Will change IV lasix to continuous infusion. No further metolazone. Restrict fluids. Rate control has improved. Coumadin is therapeutic. Starting oral amiodarone. Will plan TEE/DCCV tomorrow.   Peter Martinique, [place] 08/17/2016 9:19 AM

## 2016-08-18 ENCOUNTER — Inpatient Hospital Stay (HOSPITAL_COMMUNITY): Payer: Medicare Other | Admitting: Anesthesiology

## 2016-08-18 ENCOUNTER — Encounter (HOSPITAL_COMMUNITY)
Admission: EM | Disposition: A | Discharge status: Home / Self Care | Payer: Self-pay | Source: Home / Self Care | Attending: Cardiology

## 2016-08-18 ENCOUNTER — Encounter (HOSPITAL_COMMUNITY): Payer: Self-pay | Admitting: *Deleted

## 2016-08-18 ENCOUNTER — Inpatient Hospital Stay (HOSPITAL_COMMUNITY): Payer: Medicare Other

## 2016-08-18 DIAGNOSIS — I340 Nonrheumatic mitral (valve) insufficiency: Secondary | ICD-10-CM

## 2016-08-18 DIAGNOSIS — I4891 Unspecified atrial fibrillation: Secondary | ICD-10-CM

## 2016-08-18 HISTORY — PX: TEE WITHOUT CARDIOVERSION: SHX5443

## 2016-08-18 HISTORY — PX: CARDIOVERSION: SHX1299

## 2016-08-18 LAB — MAGNESIUM: MAGNESIUM: 2.2 mg/dL (ref 1.7–2.4)

## 2016-08-18 LAB — BASIC METABOLIC PANEL
Anion gap: 12 (ref 5–15)
BUN: 38 mg/dL — AB (ref 6–20)
CO2: 31 mmol/L (ref 22–32)
CREATININE: 1.44 mg/dL — AB (ref 0.61–1.24)
Calcium: 8.7 mg/dL — ABNORMAL LOW (ref 8.9–10.3)
Chloride: 86 mmol/L — ABNORMAL LOW (ref 101–111)
GFR calc Af Amer: 53 mL/min — ABNORMAL LOW (ref >60)
GFR, EST NON AFRICAN AMERICAN: 46 mL/min — AB (ref >60)
Glucose, Bld: 126 mg/dL — ABNORMAL HIGH (ref 65–99)
Potassium: 3.2 mmol/L — ABNORMAL LOW (ref 3.5–5.1)
SODIUM: 129 mmol/L — AB (ref 135–145)

## 2016-08-18 LAB — PROTIME-INR
INR: 2.81
PROTHROMBIN TIME: 30.2 s — AB (ref 11.4–15.2)

## 2016-08-18 SURGERY — CARDIOVERSION
Anesthesia: Monitor Anesthesia Care

## 2016-08-18 MED ORDER — POTASSIUM CHLORIDE CRYS ER 20 MEQ PO TBCR
40.0000 meq | EXTENDED_RELEASE_TABLET | Freq: Once | ORAL | Status: AC
  Administered 2016-08-18: 40 meq via ORAL
  Filled 2016-08-18: qty 2

## 2016-08-18 MED ORDER — PROPOFOL 500 MG/50ML IV EMUL
INTRAVENOUS | Status: DC | PRN
  Administered 2016-08-18: 75 ug/kg/min via INTRAVENOUS

## 2016-08-18 MED ORDER — MENTHOL 3 MG MT LOZG
1.0000 | LOZENGE | OROMUCOSAL | Status: DC | PRN
  Administered 2016-08-18: 3 mg via ORAL
  Filled 2016-08-18 (×2): qty 9

## 2016-08-18 MED ORDER — SODIUM CHLORIDE 0.9 % IV SOLN
INTRAVENOUS | Status: DC
  Administered 2016-08-18: 07:00:00 via INTRAVENOUS

## 2016-08-18 MED ORDER — LIDOCAINE HCL (CARDIAC) 20 MG/ML IV SOLN
INTRAVENOUS | Status: DC | PRN
  Administered 2016-08-18: 40 mg via INTRATRACHEAL

## 2016-08-18 MED ORDER — PHENYLEPHRINE HCL 10 MG/ML IJ SOLN
INTRAMUSCULAR | Status: DC | PRN
  Administered 2016-08-18 (×2): 120 ug via INTRAVENOUS

## 2016-08-18 MED ORDER — PROPOFOL 10 MG/ML IV BOLUS
INTRAVENOUS | Status: DC | PRN
  Administered 2016-08-18: 30 mg via INTRAVENOUS

## 2016-08-18 MED ORDER — POLYETHYLENE GLYCOL 3350 17 G PO PACK
17.0000 g | PACK | Freq: Once | ORAL | Status: AC
  Administered 2016-08-18: 17 g via ORAL
  Filled 2016-08-18: qty 1

## 2016-08-18 NOTE — H&P (View-Only) (Signed)
Progress Note  Patient Name: Adrian Lambert Date of Encounter: 08/17/2016  Primary Cardiologist: Dr. Martinique   Subjective   Sitting up, feels ok this morning. Breathing better but not as baseline.   Inpatient Medications    Scheduled Meds: . allopurinol  300 mg Oral Daily  . amiodarone  400 mg Oral BID  . fesoterodine  4 mg Oral Daily  . furosemide  80 mg Intravenous BID  . metolazone  2.5 mg Oral Daily  . metoprolol succinate  25 mg Oral Daily  . sodium chloride flush  3 mL Intravenous Q12H  . tamsulosin  0.4 mg Oral QODAY  . warfarin  5 mg Oral Once per day on Sun Mon Wed Fri   And  . warfarin  2.5 mg Oral Once per day on Tue Thu Sat  . Warfarin - Pharmacist Dosing Inpatient   Does not apply q1800   Continuous Infusions:  PRN Meds: sodium chloride, acetaminophen, guaiFENesin-dextromethorphan, ondansetron (ZOFRAN) IV, sodium chloride flush   Vital Signs    Vitals:   08/16/16 0741 08/16/16 2014 08/17/16 0554 08/17/16 0751  BP: 110/75 105/80 121/77 109/88  Pulse: (!) 110 97 85 87  Resp: 20 20 20 20   Temp: 97.6 F (36.4 C) 97.8 F (36.6 C) 97.5 F (36.4 C) 98.3 F (36.8 C)  TempSrc: Oral Oral Oral Oral  SpO2: 97% 97% 97% 96%  Weight:   235 lb 14.4 oz (107 kg)   Height:        Intake/Output Summary (Last 24 hours) at 08/17/16 0836 Last data filed at 08/17/16 0600  Gross per 24 hour  Intake             1283 ml  Output             1550 ml  Net             -267 ml   Filed Weights   08/15/16 1613 08/16/16 0539 08/17/16 0554  Weight: 236 lb 11.2 oz (107.4 kg) 237 lb 12.8 oz (107.9 kg) 235 lb 14.4 oz (107 kg)    Telemetry    Afib - Personally Reviewed  ECG    Afib, RBB- Personally Reviewed  Physical Exam   GEN: No acute distress.  Neck: No JVD Cardiac: IRRR, no murmurs, rubs, or gallops.  Respiratory:Crackles in bilateral lower lobes GI: Soft, nontender, taught MS: No edema; No deformity. Neuro:  AAOx3. Psych: Normal affect  Labs      Chemistry Recent Labs Lab 08/15/16 1419 08/16/16 0436 08/17/16 0530  NA 128* 130* 127*  K 4.2 4.6 3.8  CL 90* 92* 89*  CO2 25 28 27   GLUCOSE 130* 116* 129*  BUN 24* 24* 32*  CREATININE 1.29* 1.17 1.40*  CALCIUM 9.0 9.0 8.9  PROT 6.3*  --   --   ALBUMIN 3.9  --   --   AST 35  --   --   ALT 34  --   --   ALKPHOS 89  --   --   BILITOT 1.5*  --   --   GFRNONAA 53* 59* 48*  GFRAA >60 >60 55*  ANIONGAP 13 10 11      Hematology Recent Labs Lab 08/15/16 1146 08/15/16 1208 08/15/16 1419  WBC 13.2*  --  12.2*  RBC 6.96*  --  6.76*  HGB 13.5 16.0 13.2  HCT 41.4 47.0 40.2  MCV 59.5*  --  59.5*  MCH 19.4*  --  19.5*  MCHC 32.6  --  32.8  RDW 17.0*  --  17.1*  PLT 301  --  283    Cardiac EnzymesNo results for input(s): TROPONINI in the last 168 hours. No results for input(s): TROPIPOC in the last 168 hours.   BNP Recent Labs Lab 08/15/16 1147  BNP 630.8*     DDimer No results for input(s): DDIMER in the last 168 hours.   Radiology    Dg Chest 2 View  Result Date: 08/16/2016 CLINICAL DATA:  CHF, cough, hypertension EXAM: CHEST  2 VIEW COMPARISON:  PA and lateral chest x-ray of August 15, 2016 FINDINGS: The lungs are reasonably well inflated. The interstitial markings remain increased diffusely with areas of patchy density noted bilaterally. There small pleural effusions bilaterally. The cardiac silhouette is enlarged. The pulmonary vascularity is slightly less engorged. The patient has undergone previous aortic valve replacement. There is calcification in the wall of the aortic arch. The observed bony thorax exhibits no acute abnormality. IMPRESSION: CHF with interstitial edema and small bilateral pleural effusions little change since yesterday's study. Thoracic aortic atherosclerosis. Electronically Signed   By: David  Martinique M.D.   On: 08/16/2016 08:24   Dg Chest 2 View  Result Date: 08/15/2016 CLINICAL DATA:  3-4 weeks of cough. Treated for bronchitis. History of  CHF, aortic stenosis, and atrial fibrillation. EXAM: CHEST  2 VIEW COMPARISON:  Chest x-ray of June 12, 2016 FINDINGS: The lungs are reasonably well inflated. The interstitial markings are chronically increased but have become more conspicuous since the previous study. The cardiac silhouette is enlarged. The pulmonary vascularity is more congested centrally. There are small bilateral pleural effusions. The patient has undergone previous aortic valve replacement. There is calcification in the wall of the aortic arch. The bony thorax exhibits no acute abnormality. IMPRESSION: CHF with interstitial edema and small bilateral pleural effusions. There may be superimposed pneumonia in the appropriate clinical setting. Electronically Signed   By: David  Martinique M.D.   On: 08/15/2016 12:34    Cardiac Studies   Echocardiography 08/11/16 Study Conclusions  - Left ventricle: The cavity size was normal. Wall thickness was   increased in a pattern of mild LVH. Systolic function was   severely reduced. The estimated ejection fraction was in the   range of 25% to 30%. - Aortic valve: A bioprosthesis was present. Valve area (VTI): 1.72   cm^2. Valve area (Vmax): 1.74 cm^2. Valve area (Vmean): 1.75   cm^2. - Right atrium: The atrium was mildly dilated. - Pulmonary arteries: Systolic pressure was mildly increased. PA   peak pressure: 33 mm Hg (S).  Impressions:  - Poor quality echo.   The LV endocardium is not well seen. With contrast, the LV   function is seen to be severely depressed.   The prosthetic AV is not well seen .   Patient Profile     76 y.o. male with a past medical history of severe AS s/p SJM mech AoV in 03/2003. He has been on chronic coumadin since. He also has a h/o HTN, obesity, and mild LV dysfxn EF 40-45% in 01/2016. Found to be in Afib and volume overloaded, admitted for diuresis and TEE/DCCV  Assessment & Plan    1. Acute on chronic combined systolic/diastolic CHF: Pt has  had progressive dyspnea, orthopnea, edema, inc abd girth, and wt gain in the setting of new afib and LV dysfxn.   Admitted on 08/16/16, out 1.4L. On IV Lasix 80mg  BID, continue today.   Continue metoprolol, no ACE-I in the setting  of chronic renal disease.   2. Persistent Afib: Pt eval last week due to progressive dyspnea and found to be in afib. Rate 110 on ?blocker therapy. He does not tolerate this well, with significant volume overload as outlined above. TEE/DCCV on Friday. Will start antiarrhythmic therapy as well with Amiodarone 400mg  BID  to increase chance of NSR.   INR is 2.53   3. AS s/p Mech AoV: Stable mechanical valve by recent echo. Cont coumadin.  4. HTN: Stable.  5. Acute on chronic kidney disease stage 3.   Signed, Arbutus Leas, NP  08/17/2016, 8:36 AM   Patient seen and examined and history reviewed. Agree with above findings and plan. Still notes SOB but a little better. Still has significant edema 2-3+. Diuresis is not as brisk as I would like. Creatinine increased and more hyponatremia following metolazone yesterday. Will change IV lasix to continuous infusion. No further metolazone. Restrict fluids. Rate control has improved. Coumadin is therapeutic. Starting oral amiodarone. Will plan TEE/DCCV tomorrow.   Peter Martinique, [place] 08/17/2016 9:19 AM

## 2016-08-18 NOTE — Progress Notes (Signed)
[place] for coumadin Indication: AVR and afib  Allergies  Allergen Reactions  . Celecoxib Rash and Other (See Comments)    Celebrex    Patient Measurements:   Vital Signs: Temp: 98 F (36.7 C) (01/26 0831) Temp Source: Oral (01/26 0831) BP: 107/80 (01/26 1044) Pulse Rate: 85 (01/26 1044)  Labs:  Recent Labs  08/15/16 1146 08/15/16 1208 08/15/16 1419 08/16/16 0436 08/16/16 0923 08/17/16 0530 08/18/16 0424  HGB 13.5 16.0 13.2  --   --   --   --   HCT 41.4 47.0 40.2  --   --   --   --   PLT 301  --  283  --   --   --   --   LABPROT 24.1*  --   --   --  25.5* 27.7* 30.2*  INR 2.13  --   --   --  2.27 2.53 2.81  CREATININE 1.34* 1.20 1.29* 1.17  --  1.40* 1.44*    Estimated Creatinine Clearance: 53.9 mL/min (by C-G formula based on SCr of 1.44 mg/dL (H)).   Medical History: Past Medical History:  Diagnosis Date  . Anticoagulant long-term use   . Arthritis    "probably in my thumbs" (06/26/2012)  . Bifascicular block   . Chronic combined systolic and diastolic CHF (congestive heart failure) ([place])    a. 01/2016 Echo: EF 45-50%, gr2 DD, inflat AK (poor acoustic windows);  b. 07/2016 Echo: EF 25-30%, mild LVH, PASP 72mmHg (pt in Afib).  . Complication of anesthesia    "wake up w/a start; hallucinations" (06/26/2012)  . Critical Aortic Stenosis    a. 03/2003 s/p SJM #25 mech prosthetic AoV;  b. 07/2016 Echo: EF 25-30% (in setting of AF), AoV area 1.72 cm^2 (VTI), 1.75 cm^2 (Vmean).  . Diverticulosis   . Gouty arthritis    "have had it in both feet, ankles, right knee" (06/26/2012)  . History of diverticulitis of colon   . History of kidney stones   . History of pancreatitis   . Hypertension   . Incomplete RBBB   . PAF (paroxysmal atrial fibrillation) ([facility])    a. CHA2DS2VASc = 4-->chronic coumadin in setting of mech AoV;  b. 07/2016 Recurrent AF RVR.    Assessment: 76 yo M with DOE x 2 weeks and cough.  Pharmacy consulted to  continue home warfarin.  Warfarin PTA for [employer] AVR 03/2003 and afib.  INR remains therapeutic throughout stay. Pharmacy is continuing home regimen and will write an updated note every three days or as changes need to be made.  PTA Warfarin Dose: 2.5mg  Tues/Thurs/Sat and 5mg  AODs  Goal of Therapy:  INR 2-3   Plan:  Continue home dose  Daily INR  Andrey Cota. Diona Foley, PharmD, BCPS Clinical Pharmacist [phone] 08/18/2016 10:53 AM

## 2016-08-18 NOTE — Progress Notes (Signed)
Progress Note  Patient Name: Adrian Lambert Date of Encounter: 08/18/2016  Primary Cardiologist: Dr. Martinique   Subjective   S/p DCCV this am.  Breathing better but not as baseline.   Inpatient Medications    Scheduled Meds: . allopurinol  300 mg Oral Daily  . amiodarone  400 mg Oral BID  . fesoterodine  4 mg Oral Daily  . metoprolol succinate  25 mg Oral Daily  . sodium chloride flush  3 mL Intravenous Q12H  . tamsulosin  0.4 mg Oral QODAY  . warfarin  5 mg Oral Once per day on Sun Mon Wed Fri   And  . warfarin  2.5 mg Oral Once per day on Tue Thu Sat  . Warfarin - Pharmacist Dosing Inpatient   Does not apply q1800   Continuous Infusions: . furosemide (LASIX) infusion 8 mg/hr (08/17/16 1105)   PRN Meds: sodium chloride, acetaminophen, guaiFENesin-dextromethorphan, ondansetron (ZOFRAN) IV, sodium chloride flush   Vital Signs    Vitals:   08/18/16 1005 08/18/16 1015 08/18/16 1025 08/18/16 1044  BP: 109/69 105/72 115/85 107/80  Pulse: 85 86 87 85  Resp: 19 (!) 21 17 18   Temp:      TempSrc:      SpO2: 97% 95% 95% 97%  Weight:      Height:        Intake/Output Summary (Last 24 hours) at 08/18/16 1108 Last data filed at 08/18/16 1050  Gross per 24 hour  Intake          1074.33 ml  Output             2825 ml  Net         -1750.67 ml   Filed Weights   08/16/16 0539 08/17/16 0554 08/18/16 0439  Weight: 237 lb 12.8 oz (107.9 kg) 235 lb 14.4 oz (107 kg) 232 lb 11.2 oz (105.6 kg)    Telemetry    NSR with first degree AV block. 9 beat run NSVT - Personally Reviewed  ECG    NSR first degree AV block. RBBB. Personally Reviewed  Physical Exam   GEN: No acute distress.  Neck: No JVD Cardiac: RRR, no murmurs, rubs, or gallops. 1+ pitting edema. Respiratory: minimal crackles RLL GI: Soft, nontender, taught MS: No edema; No deformity. Neuro:  AAOx3. Psych: Normal affect  Labs    Chemistry  Recent Labs Lab 08/15/16 1419 08/16/16 0436 08/17/16 0530  08/18/16 0424  NA 128* 130* 127* 129*  K 4.2 4.6 3.8 3.2*  CL 90* 92* 89* 86*  CO2 25 28 27 31   GLUCOSE 130* 116* 129* 126*  BUN 24* 24* 32* 38*  CREATININE 1.29* 1.17 1.40* 1.44*  CALCIUM 9.0 9.0 8.9 8.7*  PROT 6.3*  --   --   --   ALBUMIN 3.9  --   --   --   AST 35  --   --   --   ALT 34  --   --   --   ALKPHOS 89  --   --   --   BILITOT 1.5*  --   --   --   GFRNONAA 53* 59* 48* 46*  GFRAA >60 >60 55* 53*  ANIONGAP 13 10 11 12      Hematology  Recent Labs Lab 08/15/16 1146 08/15/16 1208 08/15/16 1419  WBC 13.2*  --  12.2*  RBC 6.96*  --  6.76*  HGB 13.5 16.0 13.2  HCT 41.4 47.0 40.2  MCV 59.5*  --  59.5*  MCH 19.4*  --  19.5*  MCHC 32.6  --  32.8  RDW 17.0*  --  17.1*  PLT 301  --  283    Cardiac EnzymesNo results for input(s): TROPONINI in the last 168 hours. No results for input(s): TROPIPOC in the last 168 hours.   BNP  Recent Labs Lab 08/15/16 1147  BNP 630.8*     DDimer No results for input(s): DDIMER in the last 168 hours.   Radiology    No results found.  Cardiac Studies   Echocardiography 08/11/16 Study Conclusions  - Left ventricle: The cavity size was normal. Wall thickness was   increased in a pattern of mild LVH. Systolic function was   severely reduced. The estimated ejection fraction was in the   range of 25% to 30%. - Aortic valve: A bioprosthesis was present. Valve area (VTI): 1.72   cm^2. Valve area (Vmax): 1.74 cm^2. Valve area (Vmean): 1.75   cm^2. - Right atrium: The atrium was mildly dilated. - Pulmonary arteries: Systolic pressure was mildly increased. PA   peak pressure: 33 mm Hg (S).  Impressions:  - Poor quality echo.   The LV endocardium is not well seen. With contrast, the LV   function is seen to be severely depressed.   The prosthetic AV is not well seen .   Patient Profile     76 y.o. male with a past medical history of severe AS s/p SJM mech AoV in 03/2003. He has been on chronic coumadin since. He  also has a h/o HTN, obesity, and mild LV dysfxn EF 40-45% in 01/2016. Found to be in Afib and volume overloaded, admitted for diuresis and TEE/DCCV  Assessment & Plan    1. Acute on chronic combined systolic/diastolic CHF: Pt has had progressive dyspnea, orthopnea, edema, inc abd girth, and wt gain in the setting of new afib and LV dysfxn.   Improved diuresis since switched to IV lasix infusion. Negative 1.4 liters yesterday. Weight down 4 lbs.   Continue metoprolol, no ACE-I in the setting of chronic renal disease.   Continue IV lasix infusion. TEE indicates severe LV dysfunction with moderate MR. PFO. If he does not continue with clinical improvement may need heart failure to see.  2. Persistent Afib: Pt eval last week due to progressive dyspnea and found to be in afib. Rate 110 on ?blocker therapy. He does not tolerate this well, with significant volume overload as outlined above. TEE/DCCV on Friday. Loading Amiodarone 400mg  BID  to increase chance of maintaining NSR.   INR is therapeutic on coumadin. Pharmacy dosing.   3. AS s/p Mech AoV: Stable mechanical valve by recent echo. Cont coumadin.  4. HTN: Stable.  5. Acute on chronic kidney disease stage 3.  6. Hypokalemia. Will replete. Check serum magnesium.   Signed,  Peter Martinique, [place] 08/18/2016 11:08 AM

## 2016-08-18 NOTE — Interval H&P Note (Signed)
History and Physical Interval Note:  08/18/2016 9:35 AM  Adrian Lambert  has presented today for surgery, with the diagnosis of afib  The various methods of treatment have been discussed with the patient and family. After consideration of risks, benefits and other options for treatment, the patient has consented to  Procedure(s): CARDIOVERSION (N/A) TRANSESOPHAGEAL ECHOCARDIOGRAM (TEE) (N/A) as a surgical intervention .  The patient's history has been reviewed, patient examined, no change in status, stable for surgery.  I have reviewed the patient's chart and labs.  Questions were answered to the patient's satisfaction.     [name]

## 2016-08-18 NOTE — Anesthesia Postprocedure Evaluation (Signed)
Anesthesia Post Note  Patient: Adrian Lambert  Procedure(s) Performed: Procedure(s) (LRB): CARDIOVERSION (N/A) TRANSESOPHAGEAL ECHOCARDIOGRAM (TEE) (N/A)  Patient location during evaluation: Endoscopy Anesthesia Type: General Level of consciousness: awake and alert Pain management: pain level controlled Vital Signs Assessment: post-procedure vital signs reviewed and stable Respiratory status: spontaneous breathing, nonlabored ventilation, respiratory function stable and patient connected to nasal cannula oxygen Cardiovascular status: blood pressure returned to baseline and stable Postop Assessment: no signs of nausea or vomiting Anesthetic complications: no       Last Vitals:  Vitals:   08/18/16 1025 08/18/16 1044  BP: 115/85 107/80  Pulse: 87 85  Resp: 17 18  Temp:      Last Pain:  Vitals:   08/18/16 0831  TempSrc: Oral  PainSc:                  [name]

## 2016-08-18 NOTE — Transfer of Care (Signed)
Immediate Anesthesia Transfer of Care Note  Patient: Adrian Lambert  Procedure(s) Performed: Procedure(s): CARDIOVERSION (N/A) TRANSESOPHAGEAL ECHOCARDIOGRAM (TEE) (N/A)  Patient Location: Endoscopy Unit  Anesthesia Type:MAC  Level of Consciousness: awake, alert  and oriented  Airway & Oxygen Therapy: Patient Spontanous Breathing and Patient connected to nasal cannula oxygen  Post-op Assessment: Report given to RN, Post -op Vital signs reviewed and stable and Patient moving all extremities X 4  Post vital signs: Reviewed and stable  Last Vitals:  Vitals:   08/18/16 0439 08/18/16 0831  BP: 97/82 107/67  Pulse: 85 83  Resp: 18 18  Temp: 36.3 C 36.7 C    Last Pain:  Vitals:   08/18/16 0831  TempSrc: Oral  PainSc:          Complications: No apparent anesthesia complications

## 2016-08-18 NOTE — Progress Notes (Signed)
Pt had 9 bts VTach. Asymptomatic and vitals stable. Dr. Martinique notified. Will continue to monitor.

## 2016-08-18 NOTE — CV Procedure (Signed)
    Transesophageal Echocardiogram Note  Adrian Lambert [id number] 1941-06-09  Procedure: Transesophageal Echocardiogram Indications: atrial fib   Procedure Details Consent: Obtained Time Out: Verified patient identification, verified procedure, site/side was marked, verified correct patient position, special equipment/implants available, Radiology Safety Procedures followed,  medications/allergies/relevent history reviewed, required imaging and test results available.  Performed  Medications:  During this procedure the patient is administered propofol - total Propofol 175 mg IV drip  for both the TEE and Cardioversion  Left Ventrical:  Severe LV dysfunction   Mitral Valve: moderate MR   Aortic Valve: mechanical prosthetic valve   Tricuspid Valve: mild - mod TR   Pulmonic Valve: not well vis  Left Atrium/ Left atrial appendage: no thrombi   Atrial septum: He  Has a PFO with evidence of Left to Right shunting visualized by color doppler and evidence of Right to Left shunting visualized by bubble contrast   Aorta: mild calcified atheroma.    Complications: No apparent complications Patient did tolerate procedure well.   We proceded with cardioversion following the TEE      Cardioversion Note  Adrian Lambert [id number] [birth year]  Procedure: DC Cardioversion Indications: atrial fib   Procedure Details Consent: Obtained Time Out: Verified patient identification, verified procedure, site/side was marked, verified correct patient position, special equipment/implants available, Radiology Safety Procedures followed,  medications/allergies/relevent history reviewed, required imaging and test results available.  Performed  The patient has been on adequate anticoagulation.  The patient received IV Propofol ( see above )  for sedation.  Synchronous cardioversion was performed at 120  joules.  The cardioversion was successful     Complications: No apparent  complications Patient did tolerate procedure well.   Thayer Headings, Brooke Bonito., MD, [facility] 08/18/2016, 9:55 AM

## 2016-08-18 NOTE — Anesthesia Preprocedure Evaluation (Signed)
Anesthesia Evaluation  Patient identified by MRN, date of birth, ID band Patient awake    Reviewed: Allergy & Precautions, NPO status , Patient's Chart, lab work & pertinent test results  Airway Mallampati: I  TM Distance: >3 FB Neck ROM: Full    Dental   Pulmonary    Pulmonary exam normal        Cardiovascular hypertension, Normal cardiovascular exam+ dysrhythmias Atrial Fibrillation      Neuro/Psych    GI/Hepatic GERD  Medicated and Controlled,  Endo/Other    Renal/GU      Musculoskeletal   Abdominal   Peds  Hematology   Anesthesia Other Findings   Reproductive/Obstetrics                             Anesthesia Physical Anesthesia Plan  ASA: III  Anesthesia Plan: MAC   Post-op Pain Management:    Induction: Intravenous  Airway Management Planned: Simple Face Mask  Additional Equipment:   Intra-op Plan:   Post-operative Plan:   Informed Consent: I have reviewed the patients History and Physical, chart, labs and discussed the procedure including the risks, benefits and alternatives for the proposed anesthesia with the patient or authorized representative who has indicated his/her understanding and acceptance.     Plan Discussed with: CRNA and Surgeon  Anesthesia Plan Comments:         Anesthesia Quick Evaluation

## 2016-08-19 ENCOUNTER — Encounter (HOSPITAL_COMMUNITY): Payer: Self-pay | Admitting: Cardiovascular Disease

## 2016-08-19 DIAGNOSIS — I480 Paroxysmal atrial fibrillation: Secondary | ICD-10-CM

## 2016-08-19 LAB — BASIC METABOLIC PANEL
Anion gap: 13 (ref 5–15)
BUN: 37 mg/dL — AB (ref 6–20)
CHLORIDE: 84 mmol/L — AB (ref 101–111)
CO2: 34 mmol/L — ABNORMAL HIGH (ref 22–32)
CREATININE: 1.3 mg/dL — AB (ref 0.61–1.24)
Calcium: 8.5 mg/dL — ABNORMAL LOW (ref 8.9–10.3)
GFR calc Af Amer: >60 mL/min (ref >60)
GFR, EST NON AFRICAN AMERICAN: 52 mL/min — AB (ref >60)
GLUCOSE: 117 mg/dL — AB (ref 65–99)
POTASSIUM: 2.9 mmol/L — AB (ref 3.5–5.1)
Sodium: 131 mmol/L — ABNORMAL LOW (ref 135–145)

## 2016-08-19 LAB — PROTIME-INR
INR: 2.92
Prothrombin Time: 31.1 seconds — ABNORMAL HIGH (ref 11.4–15.2)

## 2016-08-19 MED ORDER — FUROSEMIDE 10 MG/ML IJ SOLN
80.0000 mg | Freq: Two times a day (BID) | INTRAMUSCULAR | Status: DC
  Administered 2016-08-19 – 2016-08-20 (×2): 80 mg via INTRAVENOUS
  Filled 2016-08-19 (×2): qty 8

## 2016-08-19 MED ORDER — DOCUSATE SODIUM 100 MG PO CAPS
100.0000 mg | ORAL_CAPSULE | Freq: Once | ORAL | Status: AC
Start: 2016-08-19 — End: 2016-08-19
  Administered 2016-08-19: 100 mg via ORAL
  Filled 2016-08-19: qty 1

## 2016-08-19 MED ORDER — POTASSIUM CHLORIDE CRYS ER 20 MEQ PO TBCR
40.0000 meq | EXTENDED_RELEASE_TABLET | Freq: Once | ORAL | Status: AC
  Administered 2016-08-19: 40 meq via ORAL
  Filled 2016-08-19: qty 2

## 2016-08-19 MED ORDER — POTASSIUM CHLORIDE CRYS ER 20 MEQ PO TBCR
40.0000 meq | EXTENDED_RELEASE_TABLET | Freq: Every day | ORAL | Status: AC
  Administered 2016-08-19 – 2016-08-21 (×3): 40 meq via ORAL
  Filled 2016-08-19 (×3): qty 2

## 2016-08-19 NOTE — Progress Notes (Signed)
Received a call from [place] patient had 9 bts of Vtach and K level of 2.9. patient asymptomatic, no complaints of any pain or discomfort.  [facility]  MD paged, awaiting to call back.

## 2016-08-19 NOTE — Progress Notes (Signed)
Progress Note  Patient Name: Adrian Lambert Date of Encounter: 08/19/2016  Primary Cardiologist: Dr. Martinique   Subjective   Dyspnea improved since DCCV; no chest pain  Inpatient Medications    Scheduled Meds: . allopurinol  300 mg Oral Daily  . amiodarone  400 mg Oral BID  . docusate sodium  100 mg Oral Once  . fesoterodine  4 mg Oral Daily  . metoprolol succinate  25 mg Oral Daily  . potassium chloride  40 mEq Oral Daily  . sodium chloride flush  3 mL Intravenous Q12H  . tamsulosin  0.4 mg Oral QODAY  . warfarin  5 mg Oral Once per day on Sun Mon Wed Fri   And  . warfarin  2.5 mg Oral Once per day on Tue Thu Sat  . Warfarin - Pharmacist Dosing Inpatient   Does not apply q1800   Continuous Infusions: . furosemide (LASIX) infusion 8 mg/hr (08/17/16 1105)   PRN Meds: sodium chloride, acetaminophen, guaiFENesin-dextromethorphan, menthol-cetylpyridinium, ondansetron (ZOFRAN) IV, sodium chloride flush   Vital Signs    Vitals:   08/18/16 1833 08/18/16 2057 08/19/16 0520 08/19/16 1023  BP: 109/77 107/85 98/65 101/72  Pulse: 88 94 81 88  Resp: 18 18 18 18   Temp:   97.7 F (36.5 C) 97.5 F (36.4 C)  TempSrc:   Oral Oral  SpO2: 98% 98% 99% 96%  Weight:   228 lb 8 oz (103.6 kg)   Height:        Intake/Output Summary (Last 24 hours) at 08/19/16 1225 Last data filed at 08/19/16 0900  Gross per 24 hour  Intake             2275 ml  Output             4195 ml  Net            -1920 ml   Filed Weights   08/17/16 0554 08/18/16 0439 08/19/16 0520  Weight: 235 lb 14.4 oz (107 kg) 232 lb 11.2 oz (105.6 kg) 228 lb 8 oz (103.6 kg)    Telemetry    NSR with first degree AV block; occasional PVCs - Personally Reviewed   Physical Exam   GEN: No acute distress.  Neck: supple Cardiac: RRR, crisp mechanical valve sound Respiratory: CTA GI: Soft, nontender, taught MS: 1+ edema; No deformity. Neuro:  AAOx3. Psych: Normal affect  Labs    Chemistry  Recent Labs Lab  08/15/16 1419  08/17/16 0530 08/18/16 0424 08/19/16 0335  NA 128*  < > 127* 129* 131*  K 4.2  < > 3.8 3.2* 2.9*  CL 90*  < > 89* 86* 84*  CO2 25  < > 27 31 34*  GLUCOSE 130*  < > 129* 126* 117*  BUN 24*  < > 32* 38* 37*  CREATININE 1.29*  < > 1.40* 1.44* 1.30*  CALCIUM 9.0  < > 8.9 8.7* 8.5*  PROT 6.3*  --   --   --   --   ALBUMIN 3.9  --   --   --   --   AST 35  --   --   --   --   ALT 34  --   --   --   --   ALKPHOS 89  --   --   --   --   BILITOT 1.5*  --   --   --   --   GFRNONAA 53*  < > 48* 46*  52*  GFRAA >60  < > 55* 53* >60  ANIONGAP 13  < > 11 12 13   < > = values in this interval not displayed.   Hematology  Recent Labs Lab 08/15/16 1146 08/15/16 1208 08/15/16 1419  WBC 13.2*  --  12.2*  RBC 6.96*  --  6.76*  HGB 13.5 16.0 13.2  HCT 41.4 47.0 40.2  MCV 59.5*  --  59.5*  MCH 19.4*  --  19.5*  MCHC 32.6  --  32.8  RDW 17.0*  --  17.1*  PLT 301  --  283    BNP  Recent Labs Lab 08/15/16 1147  BNP 630.8*     Cardiac Studies   Echocardiography 08/11/16 Study Conclusions  - Left ventricle: The cavity size was normal. Wall thickness was   increased in a pattern of mild LVH. Systolic function was   severely reduced. The estimated ejection fraction was in the   range of 25% to 30%. - Aortic valve: A bioprosthesis was present. Valve area (VTI): 1.72   cm^2. Valve area (Vmax): 1.74 cm^2. Valve area (Vmean): 1.75   cm^2. - Right atrium: The atrium was mildly dilated. - Pulmonary arteries: Systolic pressure was mildly increased. PA   peak pressure: 33 mm Hg (S).  Impressions:  - Poor quality echo.   The LV endocardium is not well seen. With contrast, the LV   function is seen to be severely depressed.   The prosthetic AV is not well seen .   Patient Profile     76 y.o. male with a past medical history of severe AS s/p SJM mech AoV in 03/2003. He has been on chronic coumadin since. He also has a h/o HTN, obesity, and mild LV dysfxn EF 40-45%  in 01/2016. Found to be in Afib and volume overloaded, admitted for diuresis and TEE/DCCV  Assessment & Plan    1. Acute on chronic combined systolic/diastolic CHF: Pts volume status improving and he feels much better following DCCV. Will change lasix to 80 mg IV BID; follow renal function. TEE indicates severe LV dysfunction with moderate MR. PFO. I/O - 1570  2. Persistent Afib: Pt remains in sinus following TEE/DCCV; continue amiodarone load and metoprolol.  3. AS s/p Mech AoV: Stable mechanical valve by recent echo. Cont coumadin.  4. Cardiomyopathy: LV function is nearly reduced. This is likely secondary to tachycardia. Continue beta blocker. We will plan to add an ACE inhibitor later if renal function and blood pressure allow. Will need follow-up echocardiogram in 3 months to see if LV function has improved.  5. Acute on chronic kidney disease stage 3- follow renal function with diuresis.  6. Hypokalemia. Will replete.    Alex Gardener, [place] 08/19/2016 12:25 PM

## 2016-08-19 NOTE — Progress Notes (Signed)
Patient alert and oriented, no shortness of breath, denies pain. Family at bedside. Will continue to monitor the patient.

## 2016-08-19 NOTE — Progress Notes (Signed)
Addressed constipation with patient, wanted to try Colace since he normally takes it. Received verbal order from MD to give Colace.

## 2016-08-20 LAB — PROTIME-INR
INR: 3.3
Prothrombin Time: 34.3 seconds — ABNORMAL HIGH (ref 11.4–15.2)

## 2016-08-20 LAB — BASIC METABOLIC PANEL
Anion gap: 13 (ref 5–15)
BUN: 33 mg/dL — ABNORMAL HIGH (ref 6–20)
CALCIUM: 8.7 mg/dL — AB (ref 8.9–10.3)
CO2: 32 mmol/L (ref 22–32)
Chloride: 86 mmol/L — ABNORMAL LOW (ref 101–111)
Creatinine, Ser: 1.21 mg/dL (ref 0.61–1.24)
GFR calc Af Amer: >60 mL/min (ref >60)
GFR calc non Af Amer: 57 mL/min — ABNORMAL LOW (ref >60)
GLUCOSE: 125 mg/dL — AB (ref 65–99)
POTASSIUM: 3.4 mmol/L — AB (ref 3.5–5.1)
Sodium: 131 mmol/L — ABNORMAL LOW (ref 135–145)

## 2016-08-20 MED ORDER — POTASSIUM CHLORIDE CRYS ER 20 MEQ PO TBCR
40.0000 meq | EXTENDED_RELEASE_TABLET | Freq: Once | ORAL | Status: AC
  Administered 2016-08-20: 40 meq via ORAL
  Filled 2016-08-20: qty 2

## 2016-08-20 MED ORDER — DEXTROSE 5 % IV SOLN
120.0000 mg | Freq: Two times a day (BID) | INTRAVENOUS | Status: DC
  Administered 2016-08-20 – 2016-08-22 (×4): 120 mg via INTRAVENOUS
  Filled 2016-08-20 (×7): qty 12

## 2016-08-20 NOTE — Progress Notes (Signed)
Progress Note  Patient Name: Adrian Lambert Date of Encounter: 08/20/2016  Primary Cardiologist: Dr. Martinique   Subjective   Mild dyspnea; no chest pain  Inpatient Medications    Scheduled Meds: . allopurinol  300 mg Oral Daily  . amiodarone  400 mg Oral BID  . fesoterodine  4 mg Oral Daily  . furosemide  80 mg Intravenous BID  . metoprolol succinate  25 mg Oral Daily  . potassium chloride  40 mEq Oral Daily  . sodium chloride flush  3 mL Intravenous Q12H  . tamsulosin  0.4 mg Oral QODAY  . Warfarin - Pharmacist Dosing Inpatient   Does not apply q1800   Continuous Infusions:  PRN Meds: sodium chloride, acetaminophen, guaiFENesin-dextromethorphan, menthol-cetylpyridinium, ondansetron (ZOFRAN) IV, sodium chloride flush   Vital Signs    Vitals:   08/20/16 0620 08/20/16 0930 08/20/16 1000 08/20/16 1028  BP: 95/77 111/64 111/64 103/72  Pulse: 81 85    Resp:  18    Temp: 97.9 F (36.6 C) 98.1 F (36.7 C)  98.2 F (36.8 C)  TempSrc: Oral Oral  Oral  SpO2: 96% 99%  98%  Weight: 228 lb 4.8 oz (103.6 kg)     Height:        Intake/Output Summary (Last 24 hours) at 08/20/16 1132 Last data filed at 08/20/16 0900  Gross per 24 hour  Intake              987 ml  Output             2150 ml  Net            -1163 ml   Filed Weights   08/18/16 0439 08/19/16 0520 08/20/16 0620  Weight: 232 lb 11.2 oz (105.6 kg) 228 lb 8 oz (103.6 kg) 228 lb 4.8 oz (103.6 kg)    Telemetry    NSR converting back to atrial fibrillation - Personally Reviewed   Physical Exam   GEN: No acute distress.  Neck: supple Cardiac: irregular, crisp mechanical valve sound Respiratory: CTA GI: Soft, nontender, taught MS: 1+ edema; No deformity. Neuro:  AAOx3. Psych: Normal affect  Labs    Chemistry  Recent Labs Lab 08/15/16 1419  08/18/16 0424 08/19/16 0335 08/20/16 0455  NA 128*  < > 129* 131* 131*  K 4.2  < > 3.2* 2.9* 3.4*  CL 90*  < > 86* 84* 86*  CO2 25  < > 31 34* 32    GLUCOSE 130*  < > 126* 117* 125*  BUN 24*  < > 38* 37* 33*  CREATININE 1.29*  < > 1.44* 1.30* 1.21  CALCIUM 9.0  < > 8.7* 8.5* 8.7*  PROT 6.3*  --   --   --   --   ALBUMIN 3.9  --   --   --   --   AST 35  --   --   --   --   ALT 34  --   --   --   --   ALKPHOS 89  --   --   --   --   BILITOT 1.5*  --   --   --   --   GFRNONAA 53*  < > 46* 52* 57*  GFRAA >60  < > 53* >60 >60  ANIONGAP 13  < > 12 13 13   < > = values in this interval not displayed.   Hematology  Recent Labs Lab 08/15/16 1146 08/15/16 1208 08/15/16  1419  WBC 13.2*  --  12.2*  RBC 6.96*  --  6.76*  HGB 13.5 16.0 13.2  HCT 41.4 47.0 40.2  MCV 59.5*  --  59.5*  MCH 19.4*  --  19.5*  MCHC 32.6  --  32.8  RDW 17.0*  --  17.1*  PLT 301  --  283    BNP  Recent Labs Lab 08/15/16 1147  BNP 630.8*     Cardiac Studies   Echocardiography 08/11/16 Study Conclusions  - Left ventricle: The cavity size was normal. Wall thickness was   increased in a pattern of mild LVH. Systolic function was   severely reduced. The estimated ejection fraction was in the   range of 25% to 30%. - Aortic valve: A bioprosthesis was present. Valve area (VTI): 1.72   cm^2. Valve area (Vmax): 1.74 cm^2. Valve area (Vmean): 1.75   cm^2. - Right atrium: The atrium was mildly dilated. - Pulmonary arteries: Systolic pressure was mildly increased. PA   peak pressure: 33 mm Hg (S).  Impressions:  - Poor quality echo.   The LV endocardium is not well seen. With contrast, the LV   function is seen to be severely depressed.   The prosthetic AV is not well seen .   Patient Profile     76 y.o. male with a past medical history of severe AS s/p SJM mech AoV in 03/2003. He has been on chronic coumadin since. He also has a h/o HTN, obesity, and mild LV dysfxn EF 40-45% in 01/2016. Found to be in Afib and volume overloaded, admitted for diuresis and TEE/DCCV  Assessment & Plan    1. Acute on chronic combined systolic/diastolic CHF:  Pts remains volume overloaded and back in atrial fibrillation. I/O-1463. Will change lasix to 120 mg IV BID; follow renal function. TEE indicates severe LV dysfunction with moderate MR. PFO. I/O - 1570  2. Persistent Afib: Pt back in atrial fibrillation following TEE/DCCV; continue amiodarone load and metoprolol. Would favor repeat DCCV 3 weeks once more amiodarone in system.  3. AS s/p Mech AoV: Stable mechanical valve by recent echo. Cont coumadin.  4. Cardiomyopathy: LV function is newly reduced. This is likely secondary to tachycardia. Continue beta blocker. We will plan to add an ACE inhibitor later if renal function and blood pressure allow. Will need follow-up echocardiogram in 3 months to see if LV function has improved.  5. Acute on chronic kidney disease stage 3- follow renal function with diuresis.  6. Hypokalemia. Will replete.    Alex Gardener, [place] 08/20/2016 11:32 AM

## 2016-08-20 NOTE — Progress Notes (Signed)
Patient did well last night, still has a cough and a little short of breath when walking.

## 2016-08-20 NOTE — Progress Notes (Signed)
[place] for coumadin Indication: AVR and afib  Allergies  Allergen Reactions  . Celecoxib Rash and Other (See Comments)    Celebrex    Patient Measurements:   Vital Signs: Temp: 97.9 F (36.6 C) (01/28 0620) Temp Source: Oral (01/28 0620) BP: 95/77 (01/28 0620) Pulse Rate: 81 (01/28 0620)  Labs:  Recent Labs  08/18/16 0424 08/19/16 0335 08/20/16 0455  LABPROT 30.2* 31.1* 34.3*  INR 2.81 2.92 3.30  CREATININE 1.44* 1.30* 1.21    Estimated Creatinine Clearance: 63.6 mL/min (by C-G formula based on SCr of 1.21 mg/dL).   Medical History: Past Medical History:  Diagnosis Date  . Anticoagulant long-term use   . Arthritis    "probably in my thumbs" (06/26/2012)  . Bifascicular block   . Chronic combined systolic and diastolic CHF (congestive heart failure) ([place])    a. 01/2016 Echo: EF 45-50%, gr2 DD, inflat AK (poor acoustic windows);  b. 07/2016 Echo: EF 25-30%, mild LVH, PASP 73mmHg (pt in Afib).  . Complication of anesthesia    "wake up w/a start; hallucinations" (06/26/2012)  . Critical Aortic Stenosis    a. 03/2003 s/p SJM #25 mech prosthetic AoV;  b. 07/2016 Echo: EF 25-30% (in setting of AF), AoV area 1.72 cm^2 (VTI), 1.75 cm^2 (Vmean).  . Diverticulosis   . Gouty arthritis    "have had it in both feet, ankles, right knee" (06/26/2012)  . History of diverticulitis of colon   . History of kidney stones   . History of pancreatitis   . Hypertension   . Incomplete RBBB   . PAF (paroxysmal atrial fibrillation) (H[facility])    a. CHA2DS2VASc = 4-->chronic coumadin in setting of mech AoV;  b. 07/2016 Recurrent AF RVR.    Assessment: 76 yo M with DOE x 2 weeks and cough.  Pharmacy consulted to continue home warfarin. Warfarin PTA for [employer] AVR 03/2003 and AFib - INR goal is 2.0-3.0 per patient. Previously initiated on PTA regimen but now INR trending up and currently supratherapeutic at 3.30. Will hold for now, consider resuming PTA  regimen once INR stabilizes.  PTA Warfarin Dose: 2.5mg  Tues/Thurs/Sat and 5mg  AODs  Goal of Therapy:  INR 2-3   Plan:  -Hold warfarin tonight -Daily INR -Resume PTA regimen as indicated  Arrie Senate, PharmD PGY-1 Pharmacy Resident Pager: [phone] 08/20/2016

## 2016-08-21 ENCOUNTER — Inpatient Hospital Stay (HOSPITAL_COMMUNITY): Payer: Medicare Other

## 2016-08-21 DIAGNOSIS — I420 Dilated cardiomyopathy: Secondary | ICD-10-CM

## 2016-08-21 LAB — BASIC METABOLIC PANEL
ANION GAP: 12 (ref 5–15)
BUN: 34 mg/dL — ABNORMAL HIGH (ref 6–20)
CALCIUM: 9.1 mg/dL (ref 8.9–10.3)
CO2: 33 mmol/L — AB (ref 22–32)
Chloride: 87 mmol/L — ABNORMAL LOW (ref 101–111)
Creatinine, Ser: 1.36 mg/dL — ABNORMAL HIGH (ref 0.61–1.24)
GFR, EST AFRICAN AMERICAN: 57 mL/min — AB (ref >60)
GFR, EST NON AFRICAN AMERICAN: 49 mL/min — AB (ref >60)
Glucose, Bld: 128 mg/dL — ABNORMAL HIGH (ref 65–99)
Potassium: 4.2 mmol/L (ref 3.5–5.1)
Sodium: 132 mmol/L — ABNORMAL LOW (ref 135–145)

## 2016-08-21 LAB — PROTIME-INR
INR: 3.09
PROTHROMBIN TIME: 32.6 s — AB (ref 11.4–15.2)

## 2016-08-21 MED ORDER — CLOTRIMAZOLE 1 % EX CREA
TOPICAL_CREAM | Freq: Two times a day (BID) | CUTANEOUS | Status: DC | PRN
  Filled 2016-08-21: qty 15

## 2016-08-21 MED ORDER — TOLNAFTATE 1 % EX CREA
TOPICAL_CREAM | Freq: Two times a day (BID) | CUTANEOUS | Status: DC | PRN
  Filled 2016-08-21: qty 30

## 2016-08-21 MED ORDER — SODIUM CHLORIDE 0.9% FLUSH
10.0000 mL | INTRAVENOUS | Status: DC | PRN
  Administered 2016-08-22: 10 mL
  Filled 2016-08-21: qty 40

## 2016-08-21 MED ORDER — WARFARIN SODIUM 2.5 MG PO TABS
2.5000 mg | ORAL_TABLET | Freq: Once | ORAL | Status: AC
  Administered 2016-08-21: 2.5 mg via ORAL
  Filled 2016-08-21: qty 1

## 2016-08-21 MED ORDER — SODIUM CHLORIDE 0.9% FLUSH
10.0000 mL | Freq: Two times a day (BID) | INTRAVENOUS | Status: DC
Start: 2016-08-21 — End: 2016-08-29
  Administered 2016-08-23 – 2016-08-28 (×3): 10 mL

## 2016-08-21 NOTE — Progress Notes (Signed)
Progress Note  Patient Name: Adrian Lambert Date of Encounter: 08/21/2016  Primary Cardiologist: Dr. Martinique   Subjective   Feels weak. Endorses orthopnea and PND last night. Denies chest pain.   Inpatient Medications    Scheduled Meds: . allopurinol  300 mg Oral Daily  . amiodarone  400 mg Oral BID  . fesoterodine  4 mg Oral Daily  . furosemide  120 mg Intravenous BID  . metoprolol succinate  25 mg Oral Daily  . sodium chloride flush  3 mL Intravenous Q12H  . tamsulosin  0.4 mg Oral QODAY  . Warfarin - Pharmacist Dosing Inpatient   Does not apply q1800   Continuous Infusions:  PRN Meds: sodium chloride, acetaminophen, guaiFENesin-dextromethorphan, menthol-cetylpyridinium, ondansetron (ZOFRAN) IV, sodium chloride flush   Vital Signs    Vitals:   08/20/16 1028 08/20/16 1400 08/20/16 2027 08/21/16 0452  BP: 103/72 110/83 110/78 118/60  Pulse:  68 67 89  Resp:  18  16  Temp: 98.2 F (36.8 C) 97.5 F (36.4 C) 98.4 F (36.9 C) 98.2 F (36.8 C)  TempSrc: Oral Oral Oral Oral  SpO2: 98% 100% 99% 98%  Weight:    229 lb 4.8 oz (104 kg)  Height:        Intake/Output Summary (Last 24 hours) at 08/21/16 0837 Last data filed at 08/21/16 0810  Gross per 24 hour  Intake             1382 ml  Output             1827 ml  Net             -445 ml   Filed Weights   08/19/16 0520 08/20/16 0620 08/21/16 0452  Weight: 228 lb 8 oz (103.6 kg) 228 lb 4.8 oz (103.6 kg) 229 lb 4.8 oz (104 kg)    Telemetry    atrial fib, rates in the 80-90's. - Personally Reviewed    Physical Exam   GEN: No acute distress.  Neck: supple, JVD to jaw. Cardiac: IRR, Mechanical valve click in S2. No rubs, or gallops.  Respiratory: crackles bilteral lower lobes, scattered expiratory wheezes.   GI: Soft, nontender, non-distended  MS: 2+ pretibial edema; No deformity. Neuro:  Nonfocal  Psych: Normal affect   Labs    Chemistry Recent Labs Lab 08/15/16 1419  08/19/16 0335 08/20/16 0455  08/21/16 0336  NA 128*  < > 131* 131* 132*  K 4.2  < > 2.9* 3.4* 4.2  CL 90*  < > 84* 86* 87*  CO2 25  < > 34* 32 33*  GLUCOSE 130*  < > 117* 125* 128*  BUN 24*  < > 37* 33* 34*  CREATININE 1.29*  < > 1.30* 1.21 1.36*  CALCIUM 9.0  < > 8.5* 8.7* 9.1  PROT 6.3*  --   --   --   --   ALBUMIN 3.9  --   --   --   --   AST 35  --   --   --   --   ALT 34  --   --   --   --   ALKPHOS 89  --   --   --   --   BILITOT 1.5*  --   --   --   --   GFRNONAA 53*  < > 52* 57* 49*  GFRAA >60  < > >60 >60 57*  ANIONGAP 13  < > 13 13 12   < > =  values in this interval not displayed.   Hematology Recent Labs Lab 08/15/16 1146 08/15/16 1208 08/15/16 1419  WBC 13.2*  --  12.2*  RBC 6.96*  --  6.76*  HGB 13.5 16.0 13.2  HCT 41.4 47.0 40.2  MCV 59.5*  --  59.5*  MCH 19.4*  --  19.5*  MCHC 32.6  --  32.8  RDW 17.0*  --  17.1*  PLT 301  --  283      BNP Recent Labs Lab 08/15/16 1147  BNP 630.8*       Radiology    No results found.  Cardiac Studies   Echocardiography 08/11/16 Study Conclusions  - Left ventricle: The cavity size was normal. Wall thickness was   increased in a pattern of mild LVH. Systolic function was   severely reduced. The estimated ejection fraction was in the   range of 25% to 30%. - Aortic valve: A bioprosthesis was present. Valve area (VTI): 1.72   cm^2. Valve area (Vmax): 1.74 cm^2. Valve area (Vmean): 1.75   cm^2. - Right atrium: The atrium was mildly dilated. - Pulmonary arteries: Systolic pressure was mildly increased. PA   peak pressure: 33 mm Hg (S).  Impressions:  - Poor quality echo.   The LV endocardium is not well seen. With contrast, the LV   function is seen to be severely depressed.   The prosthetic AV is not well seen .   Patient Profile     76 y.o. male admitted from [facility] office with new onset rapid atrial fib and reduced EF of 25-30% (previously had been 40-45% in July 2017). Also with history of mechanical AVR in 2004 (on  Coumadin).   08/15/16- admitted started IV diuresis. Weight 236 lbs.  08/16/16- PO amio started, metolazone 2.5mg  for one dose added. Weight 237.  08/17/16- Cr bumped from 1.17 to 1.44, started lasix gtt at 8mg /hr. Weight 235.  08/18/16 - weight down 3 pounds to 232 lbs. TEE - PFO with evidence of left to right shunting. Successful DCCV to NSR.  08/19/16 - Lasix changed to 120mg  IV BID. Back in Afib. Weight 228 lbs.  08/20/16 - Weight stable, still symptomatic with orthopnea.  08/21/16 - Creatinine 1.36. -6L total .   Assessment & Plan    1. Acute on chronic combined systolic/diastolic CHF: Pts remains volume overloaded and back in atrial fibrillation, rate controlled.   Continue IV diuresis, consider PICC line for CVP and Co ox.   2. Persistent Afib:Pt back in atrial fibrillation following TEE/DCCV; continue amiodarone load and metoprolol. Would favor repeat DCCV 3 weeks once more amiodarone in system.  3. AS s/p Mech [id number] mechanical valve by recent echo. Cont coumadin.  4. Cardiomyopathy: LV function is newly reduced. This is likely secondary to tachycardia. Continue beta blocker. We will plan to add an ACE inhibitor later if renal function and blood pressure allow. Will need follow-up echocardiogram in 3 months to see if LV function has improved.  5. Acute on chronic kidney disease stage 3- follow renal function with diuresis.  6. Hypokalemia.   Signed, Arbutus Leas, NP  08/21/2016, 8:37 AM    I have seen, examined and evaluated the patient this AM on rounds along with Jettie Booze, NP.  After reviewing all the available data and chart, we discussed the patients laboratory, study & physical findings as well as symptoms in detail. I agree with her findings, examination as well as impression recommendations as per our discussion.    Difficult  situation with very slow moving diuresis process. New diagnosis of exacerbated acute on chronic combined systolic and diastolic heart  failure with now worsening cardiomyopathy and reduced EF. Probably related to A. fib however we cannot exclude CAD. Unfortunately he is anticoagulated with intentions for potential redo cardioversion now that he is on amiodarone. -- Hopefully this can. I do think that obtaining some objective data with CoOx from a PICC line would help. This would also blood draws and further treatment.  PICC line is probably easier than right heart catheterization.  I would like to continue with diuresis as we are doing currently, but if he has not had any progressive improvement, we would probably need to consider potential of inotropic support. Unfortunately, we were limited and trying to use Zaroxolyn with the bump in creatinine.    Glenetta Hew, M.D., M.S. Interventional Cardiologist   Pager # [phone] Phone # [phone] [address] [place], [place] [address]

## 2016-08-21 NOTE — Progress Notes (Signed)
Peripherally Inserted Central Catheter/Midline Placement  The IV Nurse has discussed with the patient and/or persons authorized to consent for the patient, the purpose of this procedure and the potential benefits and risks involved with this procedure.  The benefits include less needle sticks, lab draws from the catheter, and the patient may be discharged home with the catheter. Risks include, but not limited to, infection, bleeding, blood clot (thrombus formation), and puncture of an artery; nerve damage and irregular heartbeat and possibility to perform a PICC exchange if needed/ordered by physician.  Alternatives to this procedure were also discussed.  Bard Power PICC patient education guide, fact sheet on infection prevention and patient information card has been provided to patient /or left at bedside.    PICC/Midline Placement Documentation  PICC Double Lumen [id number] PICC Right Basilic 42 cm 0 cm (Active)  Indication for Insertion or Continuance of Line Chronic illness with exacerbations (CF, Sickle Cell, etc.) 08/21/2016  9:02 PM  Exposed Catheter (cm) 0 cm 08/21/2016  9:02 PM  Site Assessment Clean;Dry;Intact 08/21/2016  9:02 PM  Lumen #1 Status Flushed;Saline locked;Blood return noted 08/21/2016  9:02 PM  Lumen #2 Status Flushed;Saline locked;Blood return noted 08/21/2016  9:02 PM  Dressing Type Transparent 08/21/2016  9:02 PM  Dressing Status Clean;Dry;Intact 08/21/2016  9:02 PM  Dressing Change Due 08/28/16 08/21/2016  9:02 PM       Gordan Payment 08/21/2016, 9:03 PM

## 2016-08-21 NOTE — Progress Notes (Signed)
[place] for warfarin Indication: mechanical AVR and afib  Allergies  Allergen Reactions  . Celecoxib Rash and Other (See Comments)    Celebrex    Patient Measurements:   Vital Signs: Temp: 98.2 F (36.8 C) (01/29 0452) Temp Source: Oral (01/29 0452) BP: 118/60 (01/29 0452) Pulse Rate: 89 (01/29 0452)  Labs:  Recent Labs  08/19/16 0335 08/20/16 0455 08/21/16 0336  LABPROT 31.1* 34.3* 32.6*  INR 2.92 3.30 3.09  CREATININE 1.30* 1.21 1.36*    Estimated Creatinine Clearance: 56.7 mL/min (by C-G formula based on SCr of 1.36 mg/dL (H)).   Assessment: 76 yo M with DOE x 2 weeks and cough.  Pharmacy consulted to continue home warfarin. Warfarin PTA for St Jude AVR 03/2003 and AFib - INR goal is 2-3 per patient.   INR slightly above therapeutic range at 3.09 but trending down after holding dose yesterday. Amiodarone new this admit so likely patient will need decrease in weekly regimen upon discharge.  PTA Warfarin Dose: 2.5mg  Tues/Thurs/Sat and 5mg  AODs (27.5 mg/wk)  Goal of Therapy:  INR 2-3   Plan:  -Warfarin 2.5 mg PO tonight -Daily INR -Monitor for s/sx of bleeding -Changing to 2.5 mg daily except 5 mg twice weekly would be ~22% decrease  Renold Genta, PharmD, BCPS Clinical Pharmacist Phone for today - [place] - [phone] 08/21/2016 10:43 AM

## 2016-08-21 NOTE — Progress Notes (Signed)
Pt educated about safety and bed alarm during the night however he refuses to be on bed alarm. Will continue to monitor.  Cletis Muma, RN

## 2016-08-21 NOTE — Care Management Important Message (Signed)
Important Message  Patient Details  Name: Adrian Lambert MRN: [id number] Date of Birth: [birth year]   Medicare Important Message Given:  Yes    Orbie Pyo 08/21/2016, 1:38 PM

## 2016-08-22 LAB — PROTIME-INR
INR: 2.77
Prothrombin Time: 29.8 seconds — ABNORMAL HIGH (ref 11.4–15.2)

## 2016-08-22 LAB — COOXEMETRY PANEL
Carboxyhemoglobin: 1.3 % (ref 0.5–1.5)
METHEMOGLOBIN: 0.7 % (ref 0.0–1.5)
O2 Saturation: 44 %
Total hemoglobin: 13.7 g/dL (ref 12.0–16.0)

## 2016-08-22 LAB — MRSA PCR SCREENING: MRSA BY PCR: NEGATIVE

## 2016-08-22 MED ORDER — MILRINONE LACTATE IN DEXTROSE 20-5 MG/100ML-% IV SOLN
0.1250 ug/kg/min | INTRAVENOUS | Status: AC
  Administered 2016-08-22 – 2016-08-26 (×8): 0.25 ug/kg/min via INTRAVENOUS
  Filled 2016-08-22 (×10): qty 100

## 2016-08-22 MED ORDER — ORAL CARE MOUTH RINSE
15.0000 mL | Freq: Two times a day (BID) | OROMUCOSAL | Status: DC
  Administered 2016-08-22 – 2016-08-29 (×10): 15 mL via OROMUCOSAL

## 2016-08-22 MED ORDER — WARFARIN SODIUM 2.5 MG PO TABS
2.5000 mg | ORAL_TABLET | Freq: Once | ORAL | Status: AC
  Administered 2016-08-22: 2.5 mg via ORAL
  Filled 2016-08-22: qty 1

## 2016-08-22 MED ORDER — GUAIFENESIN-DM 100-10 MG/5ML PO SYRP
5.0000 mL | ORAL_SOLUTION | ORAL | Status: DC | PRN
  Administered 2016-08-22 (×2): 5 mL via ORAL
  Filled 2016-08-22 (×3): qty 5

## 2016-08-22 MED ORDER — TRAZODONE HCL 50 MG PO TABS
25.0000 mg | ORAL_TABLET | Freq: Once | ORAL | Status: AC
  Administered 2016-08-22: 25 mg via ORAL
  Filled 2016-08-22: qty 1

## 2016-08-22 MED ORDER — AMIODARONE HCL IN DEXTROSE 360-4.14 MG/200ML-% IV SOLN
30.0000 mg/h | INTRAVENOUS | Status: DC
  Administered 2016-08-23 – 2016-08-29 (×10): 30 mg/h via INTRAVENOUS
  Filled 2016-08-22 (×16): qty 200

## 2016-08-22 MED ORDER — FUROSEMIDE 10 MG/ML IJ SOLN
120.0000 mg | Freq: Two times a day (BID) | INTRAMUSCULAR | Status: DC
  Filled 2016-08-22 (×2): qty 12

## 2016-08-22 MED ORDER — AMIODARONE HCL IN DEXTROSE 360-4.14 MG/200ML-% IV SOLN
60.0000 mg/h | INTRAVENOUS | Status: AC
  Administered 2016-08-22 (×2): 60 mg/h via INTRAVENOUS

## 2016-08-22 NOTE — Progress Notes (Signed)
Pt complained of dry cough and not being able to sleep. Paged cardiology. Verbal order given for Trazodone 25 mg and Robitussin. Will continue to monitor.   Nevah Dalal, RN

## 2016-08-22 NOTE — Progress Notes (Signed)
Progress Note  Patient Name: Adrian Lambert Date of Encounter: 08/22/2016  Primary Cardiologist: Dr. Martinique   Subjective   Feels tired, complains of orthopnea.   Inpatient Medications    Scheduled Meds: . allopurinol  300 mg Oral Daily  . amiodarone  400 mg Oral BID  . fesoterodine  4 mg Oral Daily  . furosemide  120 mg Intravenous BID  . mouth rinse  15 mL Mouth Rinse BID  . metoprolol succinate  25 mg Oral Daily  . sodium chloride flush  10-40 mL Intracatheter Q12H  . sodium chloride flush  3 mL Intravenous Q12H  . tamsulosin  0.4 mg Oral QODAY  . warfarin  2.5 mg Oral ONCE-1800  . Warfarin - Pharmacist Dosing Inpatient   Does not apply q1800   Continuous Infusions:  PRN Meds: sodium chloride, acetaminophen, clotrimazole, guaiFENesin-dextromethorphan, menthol-cetylpyridinium, ondansetron (ZOFRAN) IV, sodium chloride flush, sodium chloride flush   Vital Signs    Vitals:   08/21/16 1149 08/21/16 2005 08/22/16 0610 08/22/16 0700  BP: 95/71 105/80 102/78 112/77  Pulse: 74 80 72 78  Resp: 18 20 20    Temp: 98.2 F (36.8 C) 97.8 F (36.6 C) 97.5 F (36.4 C) 97.3 F (36.3 C)  TempSrc: Oral Oral Oral Oral  SpO2: 98% 98% 97% 99%  Weight:   231 lb 6.4 oz (105 kg)   Height:        Intake/Output Summary (Last 24 hours) at 08/22/16 1223 Last data filed at 08/22/16 0900  Gross per 24 hour  Intake              954 ml  Output             1500 ml  Net             -546 ml   Filed Weights   08/20/16 0620 08/21/16 0452 08/22/16 0610  Weight: 228 lb 4.8 oz (103.6 kg) 229 lb 4.8 oz (104 kg) 231 lb 6.4 oz (105 kg)    Telemetry    Afib, rate controlled.  - Personally Reviewed    Physical Exam   GEN: No acute distress.   Neck: JVD to jaw  Cardiac: IRRR, Mechanical valve click in S2. No rubs, or gallops.  Respiratory: Crackles in bilateral lower lobes, scattered expiratory wheezes.  GI: Soft, nontender, distended.  MS: 2+ pretibial edema.  Neuro:  Nonfocal    Psych: Normal affect   Labs    Chemistry Recent Labs Lab 08/15/16 1419  08/19/16 0335 08/20/16 0455 08/21/16 0336  NA 128*  < > 131* 131* 132*  K 4.2  < > 2.9* 3.4* 4.2  CL 90*  < > 84* 86* 87*  CO2 25  < > 34* 32 33*  GLUCOSE 130*  < > 117* 125* 128*  BUN 24*  < > 37* 33* 34*  CREATININE 1.29*  < > 1.30* 1.21 1.36*  CALCIUM 9.0  < > 8.5* 8.7* 9.1  PROT 6.3*  --   --   --   --   ALBUMIN 3.9  --   --   --   --   AST 35  --   --   --   --   ALT 34  --   --   --   --   ALKPHOS 89  --   --   --   --   BILITOT 1.5*  --   --   --   --   [facility]  53*  < > 52* 57* 49*  GFRAA >60  < > >60 >60 57*  ANIONGAP 13  < > 13 13 12   < > = values in this interval not displayed.   Hematology Recent Labs Lab 08/15/16 1419  WBC 12.2*  RBC 6.76*  HGB 13.2  HCT 40.2  MCV 59.5*  MCH 19.5*  MCHC 32.8  RDW 17.1*  PLT 283      Radiology    Dg Chest Port 1 View  Result Date: 08/21/2016 CLINICAL DATA:  Confirm line placement. EXAM: PORTABLE CHEST 1 VIEW COMPARISON:  Chest x-ray dated 08/16/2016. FINDINGS: Right-sided PICC line now in place with tip well positioned at the level of the lower SVC. Cardiomegaly appears stable. Overall cardiomediastinal silhouette appears stable in size and configuration. Atherosclerotic changes noted at the aortic arch. Patchy bilateral airspace opacities are unchanged. There are probable small bilateral pleural effusions which are unchanged. IMPRESSION: 1. Right-sided PICC line now in place with tip well-positioned at the level of the lower SVC. No evidence of procedural complicating feature. 2. Stable cardiomegaly with bilateral interstitial prominence suggesting CHF/volume overload. Probable small bilateral pleural effusions. No new lung findings. 3. Aortic atherosclerosis. Electronically Signed   By: Franki Cabot M.D.   On: 08/21/2016 21:18    Cardiac Studies   Transesophageal Echocardiography Study Conclusions  - Left ventricle: Systolic function  was severely reduced. The   estimated ejection fraction was in the range of 20% to 25%. - Aortic valve: A mechanical prosthesis was present. There was   trivial regurgitation. - Mitral valve: No evidence of vegetation. There was moderate   regurgitation. - Left atrium: No evidence of thrombus in the atrial cavity or   appendage. - Atrial septum: There was a small patent foramen ovale. There was   a bidirectional shunt through a patent foramen ovale.  Patient Profile     76 y.o. male admitted from [facility] office with new onset rapid atrial fib and reduced EF of 25-30% (previously had been 40-45% in July 2017). Also with history of mechanical AVR in 2004 (on Coumadin).   08/15/16- admitted started IV diuresis. Weight 236 lbs.  08/16/16- PO amio started, metolazone 2.5mg  for one dose added. Weight 237.  08/17/16- Cr bumped from 1.17 to 1.44, started lasix gtt at 8mg /hr. Weight 235.  08/18/16 - weight down 3 pounds to 232 lbs. TEE - PFO with evidence of left to right shunting. Successful DCCV to NSR.  08/19/16 - Lasix changed to 120mg  IV BID. Back in Afib. Weight 228 lbs.  08/20/16 - Weight stable, still symptomatic with orthopnea.  08/21/16 - Creatinine 1.36. -6L total .  08/22/16 - PICC placed, transferred to step down.   Assessment & Plan    1. Acute on chronic combined systolic/diastolic CHF: Pts remains volume overloaded and back in atrial fibrillation, rate controlled. Will start on Amio gtt.   PICC placed, will transfer to stepdown for further management CHF team to see in the am.   2. Persistent Afib:Pt back in atrial fibrillationfollowing TEE/DCCV. See above.   3. AS s/p Mech [id number] mechanical valve by recent echo. Cont coumadin.  4. Cardiomyopathy: LV function is newly reduced. This is likely secondary to tachycardia. Continue beta blocker. We will plan to add an ACE inhibitor later if renal function and blood pressure allow. Will need follow-up echocardiogram in 3  months to see if LV function has improved.  5. Acute on chronic kidney disease stage 3- follow renal function with diuresis.  6. Hypokalemia. - Replete  Signed, Arbutus Leas, NP  08/22/2016, 12:23 PM     I have seen, examined and evaluated the patient this AM along with Jettie Booze, NP.  After reviewing all the available data and chart, we discussed the patients laboratory, study & physical findings as well as symptoms in detail. I agree with her findings, examination as well as impression recommendations as per our discussion.    This point, Mr. Khaza does not seem to be progressing very rapidly despite aggressive diuresis. He is net negative, but still has significant edema and is no better as far as his dyspnea goes. Thankfully, his A. fib is well controlled from a rate standpoint with amiodarone.  We have placed a PICC line to check CoOx to determine cardiac output. After discussing with advanced heart failure team the recommendation would be to transfer to stepdown unit and oriented likely consider starting milrinone for benefit. While doing so, we will likely need to convert to IV amiodarone to avoid tachycardic response. - The use of PICC line will allow [place] to monitor his central venous pressures as well as CoOx and avoid right heart catheterization   Now would continue current dose of Lasix and monitor, but low threshold for increasing. Need to closely monitor renal function.  He is on anticoagulation, and if we are unable to improve his symptoms, may need to reconsider cardioversion attempt during this hospital stay versus waiting another 3 weeks prior to retry.  Replace potassium  Once he is transferred and started on milrinone, I will discuss with the advanced heart failure team to determine if they will take over care versus keep him on current rounding team.    Glenetta Hew, M.D., M.S. Interventional Cardiologist   Pager # [phone] Phone # [phone] [address] [place], [place] [address]

## 2016-08-22 NOTE — Progress Notes (Signed)
[place] for warfarin Indication: mechanical AVR and afib  Allergies  Allergen Reactions  . Celecoxib Rash and Other (See Comments)    Celebrex    Patient Measurements:   Vital Signs: Temp: 97.3 F (36.3 C) (01/30 0700) Temp Source: Oral (01/30 0700) BP: 112/77 (01/30 0700) Pulse Rate: 78 (01/30 0700)  Labs:  Recent Labs  08/20/16 0455 08/21/16 0336 08/22/16 0858  LABPROT 34.3* 32.6* 29.8*  INR 3.30 3.09 2.77  CREATININE 1.21 1.36*  --     Estimated Creatinine Clearance: 57 mL/min (by C-G formula based on SCr of 1.36 mg/dL (H)).   Assessment: 76 yo M with DOE x 2 weeks and cough.  Pharmacy consulted to continue home warfarin. Warfarin PTA for St Jude AVR 03/2003 and AFib - INR goal is 2-3 per patient.   INR is now at goal at 2.77. Of note, amiodarone is new this admit so may need new dose at discharge.   PTA Warfarin Dose: 2.5mg  Tues/Thurs/Sat and 5mg  AODs (27.5 mg/wk)  Goal of Therapy:  INR 2-3   Plan:  -Repeat Warfarin 2.5 mg PO tonight -Daily INR -Monitor for s/sx of bleeding -Changing to 2.5 mg daily except 5 mg twice weekly would be ~22% decrease  Salome Arnt, PharmD, BCPS Pager # [phone] 08/22/2016 11:01 AM

## 2016-08-22 NOTE — Progress Notes (Addendum)
Pt request something to help him sleep text paged on call Cardiology and was given verbal order for Trazadone

## 2016-08-22 NOTE — Progress Notes (Signed)
Patient alert and oriented, slept during the night. Still has a cough, Robitussin PRN was given during the night.   Joni Colegrove, RN

## 2016-08-23 DIAGNOSIS — I4891 Unspecified atrial fibrillation: Secondary | ICD-10-CM

## 2016-08-23 LAB — BASIC METABOLIC PANEL
Anion gap: 11 (ref 5–15)
BUN: 30 mg/dL — AB (ref 6–20)
CHLORIDE: 83 mmol/L — AB (ref 101–111)
CO2: 33 mmol/L — ABNORMAL HIGH (ref 22–32)
Calcium: 8.3 mg/dL — ABNORMAL LOW (ref 8.9–10.3)
Creatinine, Ser: 1.26 mg/dL — ABNORMAL HIGH (ref 0.61–1.24)
GFR calc Af Amer: >60 mL/min (ref >60)
GFR calc non Af Amer: 54 mL/min — ABNORMAL LOW (ref >60)
Glucose, Bld: 219 mg/dL — ABNORMAL HIGH (ref 65–99)
POTASSIUM: 3.7 mmol/L (ref 3.5–5.1)
SODIUM: 127 mmol/L — AB (ref 135–145)

## 2016-08-23 LAB — CBC
HEMATOCRIT: 40 % (ref 39.0–52.0)
HEMOGLOBIN: 12.7 g/dL — AB (ref 13.0–17.0)
MCH: 19.2 pg — ABNORMAL LOW (ref 26.0–34.0)
MCHC: 31.8 g/dL (ref 30.0–36.0)
MCV: 60.6 fL — AB (ref 78.0–100.0)
Platelets: 190 10*3/uL (ref 150–400)
RBC: 6.6 MIL/uL — AB (ref 4.22–5.81)
RDW: 16.8 % — ABNORMAL HIGH (ref 11.5–15.5)
WBC: 15.2 10*3/uL — AB (ref 4.0–10.5)

## 2016-08-23 LAB — PROTIME-INR
INR: 2.75
PROTHROMBIN TIME: 29.6 s — AB (ref 11.4–15.2)

## 2016-08-23 LAB — COOXEMETRY PANEL
Carboxyhemoglobin: 1 % (ref 0.5–1.5)
Carboxyhemoglobin: 1 % (ref 0.5–1.5)
Methemoglobin: 1 % (ref 0.0–1.5)
Methemoglobin: 1.1 % (ref 0.0–1.5)
O2 Saturation: 59.8 %
O2 Saturation: 50.6 %
TOTAL HEMOGLOBIN: 13.4 g/dL (ref 12.0–16.0)
Total hemoglobin: 13.3 g/dL (ref 12.0–16.0)

## 2016-08-23 MED ORDER — WARFARIN SODIUM 5 MG PO TABS: 5.0000 mg | ORAL_TABLET | Freq: Once | ORAL | Status: DC

## 2016-08-23 MED ORDER — POTASSIUM CHLORIDE CRYS ER 20 MEQ PO TBCR
40.0000 meq | EXTENDED_RELEASE_TABLET | Freq: Once | ORAL | Status: AC
  Administered 2016-08-23: 40 meq via ORAL
  Filled 2016-08-23: qty 2

## 2016-08-23 MED ORDER — WARFARIN SODIUM 2.5 MG PO TABS
2.5000 mg | ORAL_TABLET | Freq: Once | ORAL | Status: AC
  Administered 2016-08-23: 2.5 mg via ORAL
  Filled 2016-08-23: qty 1

## 2016-08-23 MED ORDER — SPIRONOLACTONE 25 MG PO TABS
12.5000 mg | ORAL_TABLET | Freq: Every day | ORAL | Status: DC
  Administered 2016-08-23 – 2016-08-29 (×7): 12.5 mg via ORAL
  Filled 2016-08-23 (×7): qty 1

## 2016-08-23 MED ORDER — ASPIRIN 81 MG PO CHEW
81.0000 mg | CHEWABLE_TABLET | Freq: Every day | ORAL | Status: DC
  Administered 2016-08-23 – 2016-08-29 (×7): 81 mg via ORAL
  Filled 2016-08-23 (×7): qty 1

## 2016-08-23 MED ORDER — FUROSEMIDE 10 MG/ML IJ SOLN
80.0000 mg | Freq: Once | INTRAMUSCULAR | Status: AC
  Administered 2016-08-23: 80 mg via INTRAVENOUS
  Filled 2016-08-23: qty 8

## 2016-08-23 MED ORDER — DIGOXIN 125 MCG PO TABS
0.1250 mg | ORAL_TABLET | Freq: Every day | ORAL | Status: DC
  Administered 2016-08-23 – 2016-08-29 (×7): 0.125 mg via ORAL
  Filled 2016-08-23 (×7): qty 1

## 2016-08-23 MED ORDER — METOLAZONE 5 MG PO TABS
2.5000 mg | ORAL_TABLET | Freq: Once | ORAL | Status: AC
Start: 2016-08-23 — End: 2016-08-23
  Administered 2016-08-23: 2.5 mg via ORAL
  Filled 2016-08-23: qty 1

## 2016-08-23 MED ORDER — FUROSEMIDE 10 MG/ML IJ SOLN
12.0000 mg/h | INTRAVENOUS | Status: DC
  Administered 2016-08-23 – 2016-08-25 (×2): 12 mg/h via INTRAVENOUS
  Filled 2016-08-23 (×5): qty 25

## 2016-08-23 NOTE — Progress Notes (Signed)
Patient ID: Adrian Lambert, male   DOB: 1941/05/13, 76 y.o.   MRN: [id number]   SUBJECTIVE: Patient only got 1 dose of Lasix yesterday, minimal diuresis.  He was started on milrinone yesterday with rise in co-ox from 44% to 60%.  No labs today.  Dyspnea walking around room, +orthopnea.   Patient was admitted on 1/23 with afib/RVR and volume overload.  Fairly minimal diuresis so far.  DCCV attempted on amiodarone on 1/26 but went back into atrial fibrillation.  Remains in atrial fibrillation currently.   TEE (1/18): EF 20-25%, mechanical AoV ok.    Vitals:   08/22/16 2002 08/23/16 0015 08/23/16 0436 08/23/16 0810  BP: 107/81 115/81 116/87 115/89  Pulse: 83 87 98 97  Resp: 20 15 17 16   Temp: 98.1 F (36.7 C) 97.8 F (36.6 C) 97.7 F (36.5 C) 97.9 F (36.6 C)  TempSrc: Oral Oral Oral Oral  SpO2: 96% 96% 97% 98%  Weight:   234 lb 1.6 oz (106.2 kg)   Height:        Intake/Output Summary (Last 24 hours) at 08/23/16 0827 Last data filed at 08/23/16 0600  Gross per 24 hour  Intake          1504.96 ml  Output              650 ml  Net           854.96 ml    LABS: Basic Metabolic Panel:  Recent Labs  08/21/16 0336  NA 132*  K 4.2  CL 87*  CO2 33*  GLUCOSE 128*  BUN 34*  CREATININE 1.36*  CALCIUM 9.1   Liver Function Tests: No results for input(s): AST, ALT, ALKPHOS, BILITOT, PROT, ALBUMIN in the last 72 hours. No results for input(s): LIPASE, AMYLASE in the last 72 hours. CBC: No results for input(s): WBC, NEUTROABS, HGB, HCT, MCV, PLT in the last 72 hours. Cardiac Enzymes: No results for input(s): CKTOTAL, CKMB, CKMBINDEX, TROPONINI in the last 72 hours. BNP: Invalid input(s): POCBNP D-Dimer: No results for input(s): DDIMER in the last 72 hours. Hemoglobin A1C: No results for input(s): HGBA1C in the last 72 hours. Fasting Lipid Panel: No results for input(s): CHOL, HDL, LDLCALC, TRIG, CHOLHDL, LDLDIRECT in the last 72 hours. Thyroid Function Tests: No results  for input(s): TSH, T4TOTAL, T3FREE, THYROIDAB in the last 72 hours.  Invalid input(s): FREET3 Anemia Panel: No results for input(s): VITAMINB12, FOLATE, FERRITIN, TIBC, IRON, RETICCTPCT in the last 72 hours.  RADIOLOGY: Dg Chest 2 View  Result Date: 08/16/2016 CLINICAL DATA:  CHF, cough, hypertension EXAM: CHEST  2 VIEW COMPARISON:  PA and lateral chest x-ray of August 15, 2016 FINDINGS: The lungs are reasonably well inflated. The interstitial markings remain increased diffusely with areas of patchy density noted bilaterally. There small pleural effusions bilaterally. The cardiac silhouette is enlarged. The pulmonary vascularity is slightly less engorged. The patient has undergone previous aortic valve replacement. There is calcification in the wall of the aortic arch. The observed bony thorax exhibits no acute abnormality. IMPRESSION: CHF with interstitial edema and small bilateral pleural effusions little change since yesterday's study. Thoracic aortic atherosclerosis. Electronically Signed   By: David  Martinique M.D.   On: 08/16/2016 08:24   Dg Chest 2 View  Result Date: 08/15/2016 CLINICAL DATA:  3-4 weeks of cough. Treated for bronchitis. History of CHF, aortic stenosis, and atrial fibrillation. EXAM: CHEST  2 VIEW COMPARISON:  Chest x-ray of June 12, 2016 FINDINGS: The lungs are reasonably  well inflated. The interstitial markings are chronically increased but have become more conspicuous since the previous study. The cardiac silhouette is enlarged. The pulmonary vascularity is more congested centrally. There are small bilateral pleural effusions. The patient has undergone previous aortic valve replacement. There is calcification in the wall of the aortic arch. The bony thorax exhibits no acute abnormality. IMPRESSION: CHF with interstitial edema and small bilateral pleural effusions. There may be superimposed pneumonia in the appropriate clinical setting. Electronically Signed   By: David   Martinique M.D.   On: 08/15/2016 12:34   Dg Chest Port 1 View  Result Date: 08/21/2016 CLINICAL DATA:  Confirm line placement. EXAM: PORTABLE CHEST 1 VIEW COMPARISON:  Chest x-ray dated 08/16/2016. FINDINGS: Right-sided PICC line now in place with tip well positioned at the level of the lower SVC. Cardiomegaly appears stable. Overall cardiomediastinal silhouette appears stable in size and configuration. Atherosclerotic changes noted at the aortic arch. Patchy bilateral airspace opacities are unchanged. There are probable small bilateral pleural effusions which are unchanged. IMPRESSION: 1. Right-sided PICC line now in place with tip well-positioned at the level of the lower SVC. No evidence of procedural complicating feature. 2. Stable cardiomegaly with bilateral interstitial prominence suggesting CHF/volume overload. Probable small bilateral pleural effusions. No new lung findings. 3. Aortic atherosclerosis. Electronically Signed   By: Franki Cabot M.D.   On: 08/21/2016 21:18    PHYSICAL EXAM General: NAD Neck: JVP 14+ cm, no thyromegaly or thyroid nodule.  Lungs: Basilar crackles. CV: Nondisplaced PMI.  Heart irregular S1/S2, mechanical S2, no S3/S4, 2/6 early SEM RUSB.  2+ edema to knees.   Abdomen: Soft, nontender, no hepatosplenomegaly, moderate distention.  Neurologic: Alert and oriented x 3.  Psych: Normal affect. Extremities: No clubbing or cyanosis.   TELEMETRY: Reviewed telemetry pt in atrial fibrillation, rate 70s.   ASSESSMENT AND PLAN: 76 yo with history of mechanical aortic valve and prior echo (7/17) with EF 40-45% was admitted with atrial fibrillation/RVR and acute on chronic systolic CHF.  1. Acute on chronic systolic CHF: EF on TEE this admission 20-25%, mechanical aortic valve ok.  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  No chest pain.  He is short of breath with minimal exertion.  PICC placed, co-ox 44% initially.  Co-ox rose to 60% after starting milrinone 0.25.  He remains markedly volume overloaded and has not had much diuresis so far.  No labs today.  - Send stat BMET.  - If BMET ok, Lasix 80 mg IV x 1 then Lasix gtt at 12 mg/hr with a dose of metolazone 2.5.   - Continue milrinone 0.25, follow co-ox.  - Follow CVP off line.  - Add digoxin 0.125 daily.  - Add spironolactone 12.5 daily.  - Can continue low dose Toprol XL for now.  - Eventually add ARB.  - Ideally, would get him back in NSR.  After diuresis, will wean off milrinone and try again with DCCV.  Hopefully more success with more amiodarone on board.   - Needs eventual workup for CAD.  Given no ACS/chest pain, reasonable to do DCCV to NSR and wait a couple of months with repeat echo.  If EF remains low, can then hold anticoagulation for cath.  2. Mechanical aortic valve: Continue warfarin and should be on ASA 81. Valve looked ok on TEE. 3. Atrial fibrillation: Admitted with new atrial fibrillation with RVR and CHF.  EF down to 20-25%.  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  Need to get him back in NSR.  DCCV on 1/26 but back to afib after.  Rate controlled now.  - After full diuresis, would wean off milrinone and repeat DCCV.  Hopefully will hold better after more amiodarone loading.  - Continue warfarin.  4. AKI: Mild rise in creatinine in hospital.  Follow closely with diuresis, needs BMET today.   35 minutes critical care time.   [name] 08/23/2016 8:40 AM

## 2016-08-23 NOTE — Progress Notes (Signed)
[place] for warfarin Indication: mechanical AVR and afib  Allergies  Allergen Reactions  . Celecoxib Rash and Other (See Comments)    Celebrex    Patient Measurements:   Vital Signs: Temp: 98 F (36.7 C) (01/31 1206) Temp Source: Oral (01/31 1206) BP: 116/79 (01/31 1206) Pulse Rate: 86 (01/31 1220)  Labs:  Recent Labs  08/21/16 0336 08/22/16 0858 08/23/16 0500 08/23/16 0830 08/23/16 0838  HGB  --   --   --   --  12.7*  HCT  --   --   --   --  40.0  PLT  --   --   --   --  190  LABPROT 32.6* 29.8* 29.6*  --   --   INR 3.09 2.77 2.75  --   --   CREATININE 1.36*  --   --  1.26*  --     Estimated Creatinine Clearance: 61.8 mL/min (by C-G formula based on SCr of 1.26 mg/dL (H)).   Assessment: 76 yo M with DOE x 2 weeks and cough.  Pharmacy consulted to continue home warfarin. Warfarin PTA for [employer] AVR 03/2003 and AFib - INR goal is 2-3 per patient.   INR is now at goal at 2.75. Of note, amiodarone is new this admit so may need new dose at discharge. Dose not given on 1/28  PTA Warfarin Dose: 2.5mg  Tues/Thurs/Sat and 5mg  AODs (27.5 mg/wk)  Goal of Therapy:  INR 2-3   Plan:  -Repeat Warfarin 2.5 mg PO tonight -Daily INR -Monitor for s/sx of bleeding -Changing to 2.5 mg daily except 5 mg twice weekly would be ~22% decrease   Thank you for allowing [place] to participate in this patients care.  Jens Som, PharmD Clinical phone for 08/23/2016 from 7a-3:30p: x [phone] If after 3:30p, please call main pharmacy at: [phone] 08/23/2016 12:48 PM

## 2016-08-24 LAB — BASIC METABOLIC PANEL
ANION GAP: 13 (ref 5–15)
BUN: 28 mg/dL — ABNORMAL HIGH (ref 6–20)
CO2: 37 mmol/L — AB (ref 22–32)
Calcium: 8.9 mg/dL (ref 8.9–10.3)
Chloride: 80 mmol/L — ABNORMAL LOW (ref 101–111)
Creatinine, Ser: 1.45 mg/dL — ABNORMAL HIGH (ref 0.61–1.24)
GFR, EST AFRICAN AMERICAN: 53 mL/min — AB (ref >60)
GFR, EST NON AFRICAN AMERICAN: 46 mL/min — AB (ref >60)
GLUCOSE: 165 mg/dL — AB (ref 65–99)
POTASSIUM: 3.6 mmol/L (ref 3.5–5.1)
Sodium: 130 mmol/L — ABNORMAL LOW (ref 135–145)

## 2016-08-24 LAB — COOXEMETRY PANEL
CARBOXYHEMOGLOBIN: 1.4 % (ref 0.5–1.5)
Methemoglobin: 0.7 % (ref 0.0–1.5)
O2 Saturation: 62.8 %
TOTAL HEMOGLOBIN: 13.8 g/dL (ref 12.0–16.0)

## 2016-08-24 LAB — PROTIME-INR
INR: 2.51
PROTHROMBIN TIME: 27.6 s — AB (ref 11.4–15.2)

## 2016-08-24 LAB — CBC
HCT: 40.3 % (ref 39.0–52.0)
HEMOGLOBIN: 12.9 g/dL — AB (ref 13.0–17.0)
MCH: 19.1 pg — ABNORMAL LOW (ref 26.0–34.0)
MCHC: 32 g/dL (ref 30.0–36.0)
MCV: 59.6 fL — ABNORMAL LOW (ref 78.0–100.0)
Platelets: 184 10*3/uL (ref 150–400)
RBC: 6.76 MIL/uL — ABNORMAL HIGH (ref 4.22–5.81)
RDW: 16.3 % — ABNORMAL HIGH (ref 11.5–15.5)
WBC: 14.7 10*3/uL — ABNORMAL HIGH (ref 4.0–10.5)

## 2016-08-24 MED ORDER — WARFARIN SODIUM 2.5 MG PO TABS
2.5000 mg | ORAL_TABLET | Freq: Once | ORAL | Status: AC
  Administered 2016-08-24: 2.5 mg via ORAL
  Filled 2016-08-24: qty 1

## 2016-08-24 MED ORDER — POTASSIUM CHLORIDE CRYS ER 20 MEQ PO TBCR
40.0000 meq | EXTENDED_RELEASE_TABLET | Freq: Once | ORAL | Status: AC
  Administered 2016-08-24: 40 meq via ORAL
  Filled 2016-08-24: qty 2

## 2016-08-24 NOTE — Care Management Important Message (Signed)
Important Message  Patient Details  Name: Adrian Lambert MRN: [id number] Date of Birth: [birth year]   Medicare Important Message Given:  Yes    [name] 08/24/2016, 11:59 AM

## 2016-08-24 NOTE — Progress Notes (Signed)
Patient ID: Adrian Lambert, male   DOB: [birth year], 76 y.o.   MRN: [id number]   SUBJECTIVE:  Patient was admitted on 1/23 with afib/RVR and volume overload.  Fairly minimal diuresis so far.  DCCV attempted on amiodarone on 1/26 but went back into atrial fibrillation.    Yesterday diuresed with lasix drip + metolazone. Also on milrinone for low output. Brisk diuresis noted. Negative 4.2 liters. Weight down 9 pounds. Remains in A fib  Denies SOB. Feeling better.   TEE (1/18): EF 20-25%, mechanical AoV ok.    Vitals:   08/23/16 2135 08/24/16 0041 08/24/16 0408 08/24/16 0730  BP: 99/61 98/67 98/83  96/69  Pulse: 86 95 77 81  Resp: 18 17 (!) 22 18  Temp: 98.1 F (36.7 C) 98.6 F (37 C) 98 F (36.7 C) 97.8 F (36.6 C)  TempSrc:  Oral Oral Oral  SpO2: 96% 93% 95% 95%  Weight:   225 lb (102.1 kg)   Height:        Intake/Output Summary (Last 24 hours) at 08/24/16 0757 Last data filed at 08/24/16 0716  Gross per 24 hour  Intake           1464.8 ml  Output             6375 ml  Net          -4910.2 ml    LABS: Basic Metabolic Panel:  Recent Labs  08/23/16 0830 08/24/16 0430  NA 127* 130*  K 3.7 3.6  CL 83* 80*  CO2 33* 37*  GLUCOSE 219* 165*  BUN 30* 28*  CREATININE 1.26* 1.45*  CALCIUM 8.3* 8.9   Liver Function Tests: No results for input(s): AST, ALT, ALKPHOS, BILITOT, PROT, ALBUMIN in the last 72 hours. No results for input(s): LIPASE, AMYLASE in the last 72 hours. CBC:  Recent Labs  08/23/16 0838 08/24/16 0430  WBC 15.2* 14.7*  HGB 12.7* 12.9*  HCT 40.0 40.3  MCV 60.6* 59.6*  PLT 190 184   Cardiac Enzymes: No results for input(s): CKTOTAL, CKMB, CKMBINDEX, TROPONINI in the last 72 hours. BNP: Invalid input(s): POCBNP D-Dimer: No results for input(s): DDIMER in the last 72 hours. Hemoglobin A1C: No results for input(s): HGBA1C in the last 72 hours. Fasting Lipid Panel: No results for input(s): CHOL, HDL, LDLCALC, TRIG, CHOLHDL, LDLDIRECT in the last  72 hours. Thyroid Function Tests: No results for input(s): TSH, T4TOTAL, T3FREE, THYROIDAB in the last 72 hours.  Invalid input(s): FREET3 Anemia Panel: No results for input(s): VITAMINB12, FOLATE, FERRITIN, TIBC, IRON, RETICCTPCT in the last 72 hours.  RADIOLOGY: Dg Chest 2 View  Result Date: 08/16/2016 CLINICAL DATA:  CHF, cough, hypertension EXAM: CHEST  2 VIEW COMPARISON:  PA and lateral chest x-ray of August 15, 2016 FINDINGS: The lungs are reasonably well inflated. The interstitial markings remain increased diffusely with areas of patchy density noted bilaterally. There small pleural effusions bilaterally. The cardiac silhouette is enlarged. The pulmonary vascularity is slightly less engorged. The patient has undergone previous aortic valve replacement. There is calcification in the wall of the aortic arch. The observed bony thorax exhibits no acute abnormality. IMPRESSION: CHF with interstitial edema and small bilateral pleural effusions little change since yesterday's study. Thoracic aortic atherosclerosis. Electronically Signed   By: David  Martinique M.D.   On: 08/16/2016 08:24   Dg Chest 2 View  Result Date: 08/15/2016 CLINICAL DATA:  3-4 weeks of cough. Treated for bronchitis. History of CHF, aortic stenosis, and atrial fibrillation. EXAM: CHEST  2 VIEW COMPARISON:  Chest x-ray of June 12, 2016 FINDINGS: The lungs are reasonably well inflated. The interstitial markings are chronically increased but have become more conspicuous since the previous study. The cardiac silhouette is enlarged. The pulmonary vascularity is more congested centrally. There are small bilateral pleural effusions. The patient has undergone previous aortic valve replacement. There is calcification in the wall of the aortic arch. The bony thorax exhibits no acute abnormality. IMPRESSION: CHF with interstitial edema and small bilateral pleural effusions. There may be superimposed pneumonia in the appropriate clinical  setting. Electronically Signed   By: David  Martinique M.D.   On: 08/15/2016 12:34   Dg Chest Port 1 View  Result Date: 08/21/2016 CLINICAL DATA:  Confirm line placement. EXAM: PORTABLE CHEST 1 VIEW COMPARISON:  Chest x-ray dated 08/16/2016. FINDINGS: Right-sided PICC line now in place with tip well positioned at the level of the lower SVC. Cardiomegaly appears stable. Overall cardiomediastinal silhouette appears stable in size and configuration. Atherosclerotic changes noted at the aortic arch. Patchy bilateral airspace opacities are unchanged. There are probable small bilateral pleural effusions which are unchanged. IMPRESSION: 1. Right-sided PICC line now in place with tip well-positioned at the level of the lower SVC. No evidence of procedural complicating feature. 2. Stable cardiomegaly with bilateral interstitial prominence suggesting CHF/volume overload. Probable small bilateral pleural effusions. No new lung findings. 3. Aortic atherosclerosis. Electronically Signed   By: Franki Cabot M.D.   On: 08/21/2016 21:18    PHYSICAL EXAM CVP ~6-7 General: NAD Neck: JVP 6-7 cm, no thyromegaly or thyroid nodule.  Lungs: LLL crackles.  CV: Nondisplaced PMI.  Heart irregular S1/S2, mechanical S2, no S3/S4, 2/6 early SEM RUSB.  2+ edema to knees.   Abdomen: Soft, nontender, no hepatosplenomegaly, moderate distention.  Neurologic: Alert and oriented x 3.  Psych: Normal affect. Extremities: No clubbing or cyanosis. RUE PICC   TELEMETRY: A fib 70--80s   ASSESSMENT AND PLAN: 76 yo with history of mechanical aortic valve and prior echo (7/17) with EF 40-45% was admitted with atrial fibrillation/RVR and acute on chronic systolic CHF.  1. Acute on chronic systolic CHF: EF on TEE this admission 20-25%, mechanical aortic valve ok.  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  No chest pain.  He is short of breath with minimal exertion.  PICC placed, co-ox  44% initially. Co-ox rose to 60% after starting milrinone 0.25.  Todays CO-OX is 63% on milrinone 0.25 mcg. Creatinine trending up 1.2>1.4.  CVP coming down ~6 but with significant lower extremity edema.  - Continue  Lasix gtt at 12 mg/hr another day (no metolazone today). Hopefully can transition to po tomorrow.  - Continue milrinone 0.25, follow co-ox.  - Continue digoxin 0.125 daily.  - Continue spironolactone 12.5 daily. BP soft and with creatinine trending up will continue at current dose.  - Can continue low dose Toprol XL for now.  - Eventually add ARB. BP soft will not add for now. - Add ted hose   - Ideally, would get him back in NSR.  After diuresis, will wean off milrinone and try again with DCCV.  Hopefully more success with more amiodarone on board.   - Needs eventual workup for CAD.  Given no ACS/chest pain, reasonable to do DCCV to NSR and wait a couple of months with repeat echo.  If EF remains low, can then hold anticoagulation for cath.  2. Mechanical aortic valve: Continue warfarin  and should be on ASA 81. Valve looked ok on TEE. 3. Atrial fibrillation: Admitted with new atrial fibrillation with RVR and CHF.  EF down to 20-25%.  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  Need to get him back in NSR.  DCCV on 1/26 but back to afib after.  Rate controlled now.  - After full diuresis, would wean off milrinone and repeat DCCV.  Hopefully will hold better after more amiodarone loading.  - Continue warfarin. INR 2.5  4. AKI: Creatinine trending up 1.2>1.4. BMET daily.   Amy Clegg NP-C  08/24/2016 7:57 AM   Patient seen with NP, agree with the above note.  Good diuresis yesterday and CVP down.  Co-ox better.  Still with distended abdomen and a fair amount of peripheral edema.  Will continue Lasix gtt today, no metolazone.  Once fully diuresed and we have titrated down on milrinone, will re-attempt DCCV.   [name] 08/24/2016 9:40 AM

## 2016-08-24 NOTE — Progress Notes (Signed)
[place] for warfarin Indication: mechanical AVR and afib  Allergies  Allergen Reactions  . Celecoxib Rash and Other (See Comments)    Celebrex    Patient Measurements:   Vital Signs: Temp: 97.8 F (36.6 C) (02/01 0730) Temp Source: Oral (02/01 0730) BP: 100/71 (02/01 0955) Pulse Rate: 87 (02/01 0955)  Labs:  Recent Labs  08/22/16 0858 08/23/16 0500 08/23/16 0830 08/23/16 0838 08/24/16 0430  HGB  --   --   --  12.7* 12.9*  HCT  --   --   --  40.0 40.3  PLT  --   --   --  190 184  LABPROT 29.8* 29.6*  --   --  27.6*  INR 2.77 2.75  --   --  2.51  CREATININE  --   --  1.26*  --  1.45*    Estimated Creatinine Clearance: 52.7 mL/min (by C-G formula based on SCr of 1.45 mg/dL (H)).   Assessment: 76 yo M with DOE x 2 weeks and cough.  Pharmacy consulted to continue home warfarin. Warfarin PTA for [employer] AVR 03/2003 and AFib - INR goal is 2-3 per patient.   INR is therapeutic at 2.51. No bleeding noted, CBC stable, PO intake good.  Of note, amiodarone is new this admit so may need new dose at discharge. Dose not given on 1/28  PTA Warfarin Dose: 2.5mg  Tues/Thurs/Sat and 5mg  AODs (27.5 mg/wk)  Goal of Therapy:  INR 2-3   Plan:  -Repeat Warfarin 2.5 mg PO tonight -Daily INR -Monitor for s/sx of bleeding -Changing to 2.5 mg daily except 5 mg twice weekly would be ~22% decrease   Thank you for allowing [place] to participate in this patients care.  Jens Som, PharmD Clinical phone for 08/24/2016 from 7a-3:30p: x [phone] If after 3:30p, please call main pharmacy at: [phone] 08/24/2016 10:53 AM

## 2016-08-24 NOTE — Care Management Note (Addendum)
Case Management Note  Patient Details  Name: Adrian Lambert MRN: [id number] Date of Birth: 02/28/1941  Subjective/Objective:  Pt  Presented with Acute on Chronic CHF. Initiated on IV Milrinone and IV Lasix Gtt. Per notes plan to re-attempt DCCV.  Pt is from home with spouse. Plan will be to return home once stable.                   Action/Plan: CM will continue to monitor if pt will need [place] once stable for d/c.   Expected Discharge Date:                 Expected Discharge Plan:  [place]  In-House Referral:  NA  Discharge planning Services  CM Consult  Post Acute Care Choice:   Home Health Choice offered to:   Patient, Spouse  DME Arranged:   [facility] DME Agency:    [place]:   PT  [place]:   [place]  Status of Service:Completed If discussed at [place] of Stay Meetings, dates discussed:  08-24-16  Additional Comments: 1158 08-29-16 Jacqlyn Krauss, RN,BSN [phone] PT recommendations for HHPT- CM did speak with pt in regards to home PT. Agency List provided and pt chose [facility]. CM will make referral and SOC to begin within 24-48 hours post d/c. DME RW to be delivered to room before d/c. No further needs from CM at this time.  Bethena Roys, RN 08/24/2016, 10:35 AM

## 2016-08-25 LAB — PROTIME-INR
INR: 2.3
PROTHROMBIN TIME: 25.7 s — AB (ref 11.4–15.2)

## 2016-08-25 LAB — CBC
HEMATOCRIT: 41.4 % (ref 39.0–52.0)
HEMOGLOBIN: 13.4 g/dL (ref 13.0–17.0)
MCH: 19.3 pg — AB (ref 26.0–34.0)
MCHC: 32.4 g/dL (ref 30.0–36.0)
MCV: 59.5 fL — ABNORMAL LOW (ref 78.0–100.0)
Platelets: 176 10*3/uL (ref 150–400)
RBC: 6.96 MIL/uL — ABNORMAL HIGH (ref 4.22–5.81)
RDW: 16.4 % — ABNORMAL HIGH (ref 11.5–15.5)
WBC: 14.9 10*3/uL — ABNORMAL HIGH (ref 4.0–10.5)

## 2016-08-25 LAB — BASIC METABOLIC PANEL
ANION GAP: 13 (ref 5–15)
BUN: 26 mg/dL — ABNORMAL HIGH (ref 6–20)
CO2: 39 mmol/L — AB (ref 22–32)
Calcium: 9.1 mg/dL (ref 8.9–10.3)
Chloride: 77 mmol/L — ABNORMAL LOW (ref 101–111)
Creatinine, Ser: 1.31 mg/dL — ABNORMAL HIGH (ref 0.61–1.24)
GFR calc Af Amer: 60 mL/min — ABNORMAL LOW (ref >60)
GFR calc non Af Amer: 52 mL/min — ABNORMAL LOW (ref >60)
GLUCOSE: 121 mg/dL — AB (ref 65–99)
POTASSIUM: 3.4 mmol/L — AB (ref 3.5–5.1)
Sodium: 129 mmol/L — ABNORMAL LOW (ref 135–145)

## 2016-08-25 LAB — COOXEMETRY PANEL
Carboxyhemoglobin: 1 % (ref 0.5–1.5)
Methemoglobin: 0.9 % (ref 0.0–1.5)
O2 Saturation: 75.7 %
Total hemoglobin: 13.9 g/dL (ref 12.0–16.0)

## 2016-08-25 MED ORDER — MAGNESIUM HYDROXIDE 400 MG/5ML PO SUSP
15.0000 mL | Freq: Once | ORAL | Status: AC
  Administered 2016-08-25: 15 mL via ORAL
  Filled 2016-08-25: qty 30

## 2016-08-25 MED ORDER — WARFARIN SODIUM 5 MG PO TABS
5.0000 mg | ORAL_TABLET | Freq: Once | ORAL | Status: AC
  Administered 2016-08-25: 5 mg via ORAL
  Filled 2016-08-25: qty 1

## 2016-08-25 MED ORDER — POTASSIUM CHLORIDE CRYS ER 20 MEQ PO TBCR
40.0000 meq | EXTENDED_RELEASE_TABLET | Freq: Once | ORAL | Status: AC
  Administered 2016-08-25: 40 meq via ORAL
  Filled 2016-08-25: qty 2

## 2016-08-25 MED ORDER — SODIUM CHLORIDE 0.9% FLUSH
3.0000 mL | Freq: Two times a day (BID) | INTRAVENOUS | Status: DC
  Administered 2016-08-27 – 2016-08-28 (×2): 3 mL via INTRAVENOUS

## 2016-08-25 MED ORDER — SODIUM CHLORIDE 0.9 % IV SOLN
250.0000 mL | INTRAVENOUS | Status: DC
  Administered 2016-08-28: 13:00:00 via INTRAVENOUS

## 2016-08-25 MED ORDER — SODIUM CHLORIDE 0.9% FLUSH: 3.0000 mL | INTRAVENOUS | Status: DC | PRN

## 2016-08-25 NOTE — Progress Notes (Addendum)
Patient ID: Adrian Lambert, male   DOB: [birth year], 76 y.o.   MRN: [id number]   SUBJECTIVE:  Patient was admitted on 1/23 with afib/RVR and volume overload.  Fairly minimal diuresis initially.  DCCV attempted on amiodarone on 1/26 but went back into atrial fibrillation.    Yesterday diuresed with lasix drip + metolazone. Also on milrinone for low output. CO-OX stable.  Brisk diuresis noted. Negative 4.8 liters. Weight down another 4 pounds. Remains in A fib  Denies SOB.    TEE (1/18): EF 20-25%, mechanical AoV ok.   Scheduled Meds: . allopurinol  300 mg Oral Daily  . aspirin  81 mg Oral Daily  . digoxin  0.125 mg Oral Daily  . fesoterodine  4 mg Oral Daily  . mouth rinse  15 mL Mouth Rinse BID  . metoprolol succinate  25 mg Oral Daily  . sodium chloride flush  10-40 mL Intracatheter Q12H  . sodium chloride flush  3 mL Intravenous Q12H  . sodium chloride flush  3 mL Intravenous Q12H  . spironolactone  12.5 mg Oral Daily  . tamsulosin  0.4 mg Oral QODAY  . Warfarin - Pharmacist Dosing Inpatient   Does not apply q1800   Continuous Infusions: . sodium chloride    . amiodarone 30 mg/hr (08/25/16 0417)  . furosemide (LASIX) infusion 12 mg/hr (08/24/16 1834)  . milrinone 0.25 mcg/kg/min (08/24/16 1834)   PRN Meds:.sodium chloride, acetaminophen, clotrimazole, guaiFENesin-dextromethorphan, menthol-cetylpyridinium, ondansetron (ZOFRAN) IV, sodium chloride flush, sodium chloride flush, sodium chloride flush    Vitals:   08/24/16 1145 08/24/16 1557 08/24/16 2312 08/25/16 0421  BP: 103/71 95/61 92/60  95/60  Pulse: 80 75 88 73  Resp: 16 17 18 17   Temp: 97.6 F (36.4 C) 98.2 F (36.8 C)  98.5 F (36.9 C)  TempSrc: Oral Oral Oral Oral  SpO2: 97% 97% 97% 95%  Weight:    221 lb 9.6 oz (100.5 kg)  Height:        Intake/Output Summary (Last 24 hours) at 08/25/16 0723 Last data filed at 08/25/16 0600  Gross per 24 hour  Intake           1191.6 ml  Output             5350 ml  Net           -4158.4 ml    LABS: Basic Metabolic Panel:  Recent Labs  08/24/16 0430 08/25/16 0513  NA 130* 129*  K 3.6 3.4*  CL 80* 77*  CO2 37* 39*  GLUCOSE 165* 121*  BUN 28* 26*  CREATININE 1.45* 1.31*  CALCIUM 8.9 9.1   Liver Function Tests: No results for input(s): AST, ALT, ALKPHOS, BILITOT, PROT, ALBUMIN in the last 72 hours. No results for input(s): LIPASE, AMYLASE in the last 72 hours. CBC:  Recent Labs  08/24/16 0430 08/25/16 0513  WBC 14.7* 14.9*  HGB 12.9* 13.4  HCT 40.3 41.4  MCV 59.6* 59.5*  PLT 184 176   Cardiac Enzymes: No results for input(s): CKTOTAL, CKMB, CKMBINDEX, TROPONINI in the last 72 hours. BNP: Invalid input(s): POCBNP D-Dimer: No results for input(s): DDIMER in the last 72 hours. Hemoglobin A1C: No results for input(s): HGBA1C in the last 72 hours. Fasting Lipid Panel: No results for input(s): CHOL, HDL, LDLCALC, TRIG, CHOLHDL, LDLDIRECT in the last 72 hours. Thyroid Function Tests: No results for input(s): TSH, T4TOTAL, T3FREE, THYROIDAB in the last 72 hours.  Invalid input(s): FREET3 Anemia Panel: No results for input(s): VITAMINB12, FOLATE,  FERRITIN, TIBC, IRON, RETICCTPCT in the last 72 hours.  RADIOLOGY: Dg Chest 2 View  Result Date: 08/16/2016 CLINICAL DATA:  CHF, cough, hypertension EXAM: CHEST  2 VIEW COMPARISON:  PA and lateral chest x-ray of August 15, 2016 FINDINGS: The lungs are reasonably well inflated. The interstitial markings remain increased diffusely with areas of patchy density noted bilaterally. There small pleural effusions bilaterally. The cardiac silhouette is enlarged. The pulmonary vascularity is slightly less engorged. The patient has undergone previous aortic valve replacement. There is calcification in the wall of the aortic arch. The observed bony thorax exhibits no acute abnormality. IMPRESSION: CHF with interstitial edema and small bilateral pleural effusions little change since yesterday's study. Thoracic  aortic atherosclerosis. Electronically Signed   By: David  Martinique M.D.   On: 08/16/2016 08:24   Dg Chest 2 View  Result Date: 08/15/2016 CLINICAL DATA:  3-4 weeks of cough. Treated for bronchitis. History of CHF, aortic stenosis, and atrial fibrillation. EXAM: CHEST  2 VIEW COMPARISON:  Chest x-ray of June 12, 2016 FINDINGS: The lungs are reasonably well inflated. The interstitial markings are chronically increased but have become more conspicuous since the previous study. The cardiac silhouette is enlarged. The pulmonary vascularity is more congested centrally. There are small bilateral pleural effusions. The patient has undergone previous aortic valve replacement. There is calcification in the wall of the aortic arch. The bony thorax exhibits no acute abnormality. IMPRESSION: CHF with interstitial edema and small bilateral pleural effusions. There may be superimposed pneumonia in the appropriate clinical setting. Electronically Signed   By: David  [place] M.D.   On: 08/15/2016 12:34   Dg Chest Port 1 View  Result Date: 08/21/2016 CLINICAL DATA:  Confirm line placement. EXAM: PORTABLE CHEST 1 VIEW COMPARISON:  Chest x-ray dated 08/16/2016. FINDINGS: Right-sided PICC line now in place with tip well positioned at the level of the lower SVC. Cardiomegaly appears stable. Overall cardiomediastinal silhouette appears stable in size and configuration. Atherosclerotic changes noted at the aortic arch. Patchy bilateral airspace opacities are unchanged. There are probable small bilateral pleural effusions which are unchanged. IMPRESSION: 1. Right-sided PICC line now in place with tip well-positioned at the level of the lower SVC. No evidence of procedural complicating feature. 2. Stable cardiomegaly with bilateral interstitial prominence suggesting CHF/volume overload. Probable small bilateral pleural effusions. No new lung findings. 3. Aortic atherosclerosis. Electronically Signed   By: Franki Cabot M.D.   On:  08/21/2016 21:18    PHYSICAL EXAM CVP ~4-5 General: NAD Neck: JVP 5-6 cm, no thyromegaly or thyroid nodule.  Lungs: decreased LLL  CV: Nondisplaced PMI.  Heart irregular S1/S2, mechanical S2, no S3/S4, 2/6 early SEM RUSB.  2+ edema to knees.   Abdomen: Soft, nontender, no hepatosplenomegaly, moderate distention.  Neurologic: Alert and oriented x 3.  Psych: Normal affect. Extremities: No clubbing or cyanosis. RUE PICC   TELEMETRY: A fib 70s   ASSESSMENT AND PLAN: 76 yo with history of mechanical aortic valve and prior echo (7/17) with EF 40-45% was admitted with atrial fibrillation/RVR and acute on chronic systolic CHF.  1. Acute on chronic systolic CHF: EF on TEE this admission 20-25%, mechanical aortic valve ok.  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  No chest pain.  He is short of breath with minimal exertion.  PICC placed, co-ox 44% initially. Co-ox rose to 60% after starting milrinone 0.25.  Todays CO-OX is 76% on milrinone 0.25 mcg. Creatinine trend  flat 1.3  CVP down but with significant lower extremity edema.  - Continue  Lasix gtt at 12 mg/hr another day (no metolazone today). Push one-two more day with IV diuresis as renal function remains stable.  - Continue milrinone 0.25, follow co-ox.  - Continue digoxin 0.125 daily.  - Continue spironolactone 12.5 daily. BP soft  - Can continue low dose Toprol XL for now.  - Eventually add ARB. BP remains soft will not add for now. - Continue ted hose.    - Consult cardiac rehab.  - Ideally, would get him back in NSR.  After diuresis, will wean off milrinone and try again with DCCV.  Hopefully more success with more amiodarone on board.   - Needs eventual workup for CAD.  Given no ACS/chest pain, reasonable to do DCCV to NSR and wait a couple of months with repeat echo.  If EF remains low, can then hold anticoagulation for cath.  2. Mechanical aortic valve: Continue warfarin and  should be on ASA 81. Valve looked ok on TEE. 3. Atrial fibrillation: Admitted with new atrial fibrillation with RVR and CHF.  EF down to 20-25%.  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  Need to get him back in NSR.  DCCV on 1/26 but back to afib after.  Rate controlled now.  - After full diuresis, would wean off milrinone and repeat DCCV.  Hopefully will hold better after more amiodarone loading.  - Continue warfarin. INR 2.3 - Set up DC-CV Monday or Tuesday. 4. AKI: Creatinine trending up 1.2>1.4>1.3. BMET daily. 5. Hypokalemia-  Supplement potassium   Amy Clegg NP-C  08/25/2016 7:23 AM   Patient seen with NP, agree with the above note.  Doing well, weight coming down and diuresing nicely.  Good co-ox.  Would give 1 more day of IV Lasix then to po tomorrow.  At that point, will begin titrating off milrinone.  Would like to arrange DCCV for Monday.   [name] 08/25/2016 9:12 AM

## 2016-08-25 NOTE — Progress Notes (Signed)
CARDIAC REHAB PHASE I   PRE:  Rate/Rhythm: 76 afib    BP: sitting 98/77    SaO2: 96 2L  MODE:  Ambulation: 500 ft   POST:  Rate/Rhythm: 82 afib    BP: sitting 109/80     SaO2: 96 2L  Able to stand and walk with RW and 2L O2. No actual assist needed except for equipment. He prefers to wear O2 for comfort. No real SOB walking at slow pace, sts he feels much better. Return to recliner. Encouraged more walking, to read materials and watch videos (HF and afib). Will f/u tomorrow. [place], ACSM 08/25/2016 3:07 PM

## 2016-08-25 NOTE — Progress Notes (Signed)
Castor for warfarin Indication: mechanical AVR and afib  Allergies  Allergen Reactions  . Celecoxib Rash and Other (See Comments)    Celebrex    Patient Measurements:   Vital Signs: Temp: 97.5 F (36.4 C) (02/02 0916) Temp Source: Axillary (02/02 0916) BP: 98/63 (02/02 0916) Pulse Rate: 67 (02/02 0916)  Labs:  Recent Labs  08/23/16 0500 08/23/16 0830  08/23/16 0838 08/24/16 0430 08/25/16 0513  HGB  --   --   < > 12.7* 12.9* 13.4  HCT  --   --   --  40.0 40.3 41.4  PLT  --   --   --  190 184 176  LABPROT 29.6*  --   --   --  27.6* 25.7*  INR 2.75  --   --   --  2.51 2.30  CREATININE  --  1.26*  --   --  1.45* 1.31*  < > = values in this interval not displayed.  Estimated Creatinine Clearance: 57.6 mL/min (by C-G formula based on SCr of 1.31 mg/dL (H)).   Assessment: 76 yo M with DOE x 2 weeks and cough.  Pharmacy consulted to continue home warfarin. Warfarin PTA for St Jude AVR 03/2003 and AFib - INR goal is 2-3 per patient.   INR is therapeutic at 2.3. No bleeding noted, CBC stable, PO intake good.  Of note, amiodarone is new this admit so may need new dose at discharge. Dose not given on 1/28  PTA Warfarin Dose: 2.5mg  Tues/Thurs/Sat and 5mg  AODs (27.5 mg/wk)  Goal of Therapy:  INR 2-3   Plan:  -Give Warfarin 5 mg PO tonight -Daily INR -Monitor for s/sx of bleeding -Changing to 2.5 mg daily except 5 mg twice weekly would be ~22% decrease   Thank you for allowing [place] to participate in this patients care.  Jens Som, PharmD Clinical phone for 08/25/2016 from 7a-3:30p: x [phone] If after 3:30p, please call main pharmacy at: [phone] 08/25/2016 12:07 PM

## 2016-08-26 DIAGNOSIS — N289 Disorder of kidney and ureter, unspecified: Secondary | ICD-10-CM

## 2016-08-26 LAB — CBC WITH DIFFERENTIAL/PLATELET
BASOS ABS: 0 10*3/uL (ref 0.0–0.1)
BASOS PCT: 0 %
EOS ABS: 0.1 10*3/uL (ref 0.0–0.7)
Eosinophils Relative: 1 %
HCT: 40.9 % (ref 39.0–52.0)
Hemoglobin: 13.4 g/dL (ref 13.0–17.0)
Lymphocytes Relative: 8 %
Lymphs Abs: 1.1 10*3/uL (ref 0.7–4.0)
MCH: 19.5 pg — AB (ref 26.0–34.0)
MCHC: 32.8 g/dL (ref 30.0–36.0)
MCV: 59.4 fL — ABNORMAL LOW (ref 78.0–100.0)
MONO ABS: 1.3 10*3/uL — AB (ref 0.1–1.0)
Monocytes Relative: 10 %
NEUTROS PCT: 81 %
Neutro Abs: 10.9 10*3/uL — ABNORMAL HIGH (ref 1.7–7.7)
PLATELETS: 181 10*3/uL (ref 150–400)
RBC: 6.88 MIL/uL — ABNORMAL HIGH (ref 4.22–5.81)
RDW: 16.7 % — ABNORMAL HIGH (ref 11.5–15.5)
WBC: 13.4 10*3/uL — AB (ref 4.0–10.5)

## 2016-08-26 LAB — COOXEMETRY PANEL
CARBOXYHEMOGLOBIN: 1 % (ref 0.5–1.5)
METHEMOGLOBIN: 0.9 % (ref 0.0–1.5)
O2 SAT: 60.7 %
Total hemoglobin: 13.9 g/dL (ref 12.0–16.0)

## 2016-08-26 LAB — BASIC METABOLIC PANEL
ANION GAP: 10 (ref 5–15)
BUN: 26 mg/dL — ABNORMAL HIGH (ref 6–20)
CALCIUM: 9.3 mg/dL (ref 8.9–10.3)
CO2: 39 mmol/L — AB (ref 22–32)
CREATININE: 1.41 mg/dL — AB (ref 0.61–1.24)
Chloride: 81 mmol/L — ABNORMAL LOW (ref 101–111)
GFR, EST AFRICAN AMERICAN: 55 mL/min — AB (ref >60)
GFR, EST NON AFRICAN AMERICAN: 47 mL/min — AB (ref >60)
Glucose, Bld: 127 mg/dL — ABNORMAL HIGH (ref 65–99)
Potassium: 3.7 mmol/L (ref 3.5–5.1)
SODIUM: 130 mmol/L — AB (ref 135–145)

## 2016-08-26 LAB — PROTIME-INR
INR: 2.39
Prothrombin Time: 26.5 seconds — ABNORMAL HIGH (ref 11.4–15.2)

## 2016-08-26 MED ORDER — FUROSEMIDE 80 MG PO TABS
120.0000 mg | ORAL_TABLET | Freq: Two times a day (BID) | ORAL | Status: DC
  Administered 2016-08-26 – 2016-08-28 (×5): 120 mg via ORAL
  Filled 2016-08-26 (×6): qty 1

## 2016-08-26 MED ORDER — WARFARIN SODIUM 5 MG PO TABS
5.0000 mg | ORAL_TABLET | Freq: Once | ORAL | Status: AC
Start: 2016-08-26 — End: 2016-08-26
  Administered 2016-08-26: 5 mg via ORAL
  Filled 2016-08-26: qty 1

## 2016-08-26 NOTE — Progress Notes (Signed)
ANTICOAGULATION CONSULT NOTE - Follow Up Consult  Pharmacy Consult for Coumadin Indication: mechanical AVR and afib  Allergies  Allergen Reactions  . Celecoxib Rash and Other (See Comments)    Celebrex    Patient Measurements: Height: 5\' 10"  (177.8 cm) Weight: 220 lb 14.4 oz (100.2 kg) IBW/kg (Calculated) : 73  Vital Signs: Temp: 97.5 F (36.4 C) (02/03 1100) Temp Source: Oral (02/03 1100) BP: 95/62 (02/03 1100) Pulse Rate: 62 (02/03 1100)  Labs:  Recent Labs  08/24/16 0430 08/25/16 0513 08/26/16 0436  HGB 12.9* 13.4 13.4  HCT 40.3 41.4 40.9  PLT 184 176 181  LABPROT 27.6* 25.7* 26.5*  INR 2.51 2.30 2.39  CREATININE 1.45* 1.31* 1.41*    Estimated Creatinine Clearance: 53.7 mL/min (by C-G formula based on SCr of 1.41 mg/dL (H)).  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   INR is therapeutic today at 2.39.  New to amiodarone this admission., has had variable Coumadin doses.    Most recent outpatient regimen is 2.5 mg on MWFSun and 5 mg on TTSat.   Wife and patient report previously on 5 mg daily until 10-day Prednisone course, beginning 07/14/16, which effected INR.  Goal of Therapy:  INR 2-3 per patient report Monitor platelets by anticoagulation protocol: Yes   Plan:   Coumadin 5 mg again today.  Daily PT/INR.  Arty Baumgartner, [place] Pager: [phone] 08/26/2016,12:03 PM

## 2016-08-26 NOTE — Progress Notes (Signed)
CARDIAC REHAB PHASE I   PRE:  Rate/Rhythm: 82 Afib  BP:  Supine:   Sitting: 107/73  Standing:    SaO2: 99% RA  MODE:  Ambulation: 500 ft   POST:  Rate/Rhythm: 84 Afib  BP:  Supine:   Sitting: 115/74  Standing:    SaO2: 95% RA  1400-1425 Pt tolerated ambulation well with assist x1 and pushing rolling walker. Gait steady, encouraged pursed-lip breathing, maintained SaO2 between 92-94% on room air during ambulation. SaO2 dropped to 90% briefly at the end of the walk but recovered quickly. Back to chair after ambulation, with legs elevated, call bell within reach.  Seward Carol, MS, ACSM CEP

## 2016-08-26 NOTE — Progress Notes (Signed)
Progress Note  Patient Name: Adrian Lambert Date of Encounter: 08/26/2016  Primary Cardiologist: [place]  Subjective   Feels better. Breathing improved. Edema substantially reduced. Difficulty sleeping due to frequent urination. Denies angina  Inpatient Medications    Scheduled Meds: . allopurinol  300 mg Oral Daily  . aspirin  81 mg Oral Daily  . digoxin  0.125 mg Oral Daily  . fesoterodine  4 mg Oral Daily  . mouth rinse  15 mL Mouth Rinse BID  . metoprolol succinate  25 mg Oral Daily  . sodium chloride flush  10-40 mL Intracatheter Q12H  . sodium chloride flush  3 mL Intravenous Q12H  . sodium chloride flush  3 mL Intravenous Q12H  . spironolactone  12.5 mg Oral Daily  . tamsulosin  0.4 mg Oral QODAY  . Warfarin - Pharmacist Dosing Inpatient   Does not apply q1800   Continuous Infusions: . sodium chloride    . amiodarone 30 mg/hr (08/26/16 0645)  . furosemide (LASIX) infusion 12 mg/hr (08/25/16 2037)  . milrinone 0.25 mcg/kg/min (08/25/16 2201)   PRN Meds: sodium chloride, acetaminophen, clotrimazole, guaiFENesin-dextromethorphan, menthol-cetylpyridinium, ondansetron (ZOFRAN) IV, sodium chloride flush, sodium chloride flush, sodium chloride flush   Vital Signs    Vitals:   08/25/16 2055 08/25/16 2352 08/26/16 0455 08/26/16 0803  BP: 117/75 104/74 91/66   Pulse: 84 69 71 75  Resp: (!) 22 14 16 15   Temp: 98.3 F (36.8 C) 98.7 F (37.1 C) 98 F (36.7 C)   TempSrc: Oral Oral Oral   SpO2: 95% 95% 100% 93%  Weight:   100.2 kg (220 lb 14.4 oz)   Height:        Intake/Output Summary (Last 24 hours) at 08/26/16 0817 Last data filed at 08/26/16 0645  Gross per 24 hour  Intake          1015.93 ml  Output             4845 ml  Net         -3829.07 ml   Filed Weights   08/25/16 0421 08/25/16 0809 08/26/16 0455  Weight: 100.5 kg (221 lb 9.6 oz) 99.4 kg (219 lb 1.6 oz) 100.2 kg (220 lb 14.4 oz)    Telemetry    Atrial fibrillation with controlled response -  Personally Reviewed  Physical Exam  Alert and oriented, comfortable GEN: No acute distress.   Neck: No JVD Cardiac: RRR, no murmurs, rubs, or gallops.  Respiratory: Clear to auscultation bilaterally. GI: Soft, nontender, non-distended  MS: No edema; developing deep wrinkles on his legs No deformity. Neuro:  Nonfocal  Psych: Normal affect   Labs    Chemistry Recent Labs Lab 08/24/16 0430 08/25/16 0513 08/26/16 0436  NA 130* 129* 130*  K 3.6 3.4* 3.7  CL 80* 77* 81*  CO2 37* 39* 39*  GLUCOSE 165* 121* 127*  BUN 28* 26* 26*  CREATININE 1.45* 1.31* 1.41*  CALCIUM 8.9 9.1 9.3  GFRNONAA 46* 52* 47*  GFRAA 53* 60* 55*  ANIONGAP 13 13 10      Hematology Recent Labs Lab 08/24/16 0430 08/25/16 0513 08/26/16 0436  WBC 14.7* 14.9* 13.4*  RBC 6.76* 6.96* 6.88*  HGB 12.9* 13.4 13.4  HCT 40.3 41.4 40.9  MCV 59.6* 59.5* 59.4*  MCH 19.1* 19.3* 19.5*  MCHC 32.0 32.4 32.8  RDW 16.3* 16.4* 16.7*  PLT 184 176 181    Radiology    No results found.  Cardiac Studies   Echo 08/11/2016 -  Left ventricle: The cavity size was normal. Wall thickness was   increased in a pattern of mild LVH. Systolic function was   severely reduced. The estimated ejection fraction was in the   range of 25% to 30%. - Aortic valve: A bioprosthesis was present. Valve area (VTI): 1.72   cm^2. Valve area (Vmax): 1.74 cm^2. Valve area (Vmean): 1.75   cm^2. - Right atrium: The atrium was mildly dilated. - Pulmonary arteries: Systolic pressure was mildly increased. PA   peak pressure: 33 mm Hg (S).  Impressions:  - Poor quality echo.   The LV endocardium is not well seen. With contrast, the LV   function is seen to be severely depressed.   The prosthetic AV is not well seen .  TEE 01/262018  - Left ventricle: Systolic function was severely reduced. The   estimated ejection fraction was in the range of 20% to 25%. - Aortic valve: A mechanical prosthesis was present. There was   trivial  regurgitation. - Mitral valve: No evidence of vegetation. There was moderate   regurgitation. - Left atrium: No evidence of thrombus in the atrial cavity or   appendage. - Atrial septum: There was a small patent foramen ovale. There was   a bidirectional shunt through a patent foramen ovale.  Patient Profile     76 y.o. male with severe cardiomyopathy and acute decompensation of heart failure in the setting of atrial fibrillation of uncertain duration and background of previous aortic valve replacement with mechanical prosthesis  Assessment & Plan    1.Acute on chronic systolic heart failure: Prodigious diuresis (net 20 L), lost over 7 kg since admission and is at lowest weight in a year. We'll switch to oral diuretics. Plan to wean off milrinone over the weekend. Hopefully the biggest component of his cardio myopathy is tachycardia related and will gradually improve with rate and rhythm control. If this does not occur over the next several weeks, will need to repeat evaluation for possible coronary disease. 2. Atrial fibrillation: Had failed cardioversion attempt earlier this week, attempt again after amiodarone loading on Monday. This procedure has been fully reviewed with the patient and written informed consent has been obtained. 3. Mechanical AVR: Continue full anticoagulation, no abnormalities seen on transthoracic or transesophageal echo. 4. Acute on chronic renal insufficiency: Slight increase in creatinine has stabilized at around 1.4. Note persistent hyponatremia, mild.  Signed, Sanda Klein, MD  08/26/2016, 8:17 AM

## 2016-08-27 LAB — BASIC METABOLIC PANEL
ANION GAP: 14 (ref 5–15)
BUN: 29 mg/dL — ABNORMAL HIGH (ref 6–20)
CHLORIDE: 81 mmol/L — AB (ref 101–111)
CO2: 37 mmol/L — AB (ref 22–32)
CREATININE: 1.47 mg/dL — AB (ref 0.61–1.24)
Calcium: 9.2 mg/dL (ref 8.9–10.3)
GFR calc non Af Amer: 45 mL/min — ABNORMAL LOW (ref >60)
GFR, EST AFRICAN AMERICAN: 52 mL/min — AB (ref >60)
Glucose, Bld: 122 mg/dL — ABNORMAL HIGH (ref 65–99)
POTASSIUM: 3.4 mmol/L — AB (ref 3.5–5.1)
Sodium: 132 mmol/L — ABNORMAL LOW (ref 135–145)

## 2016-08-27 LAB — PROTIME-INR
INR: 2.43
Prothrombin Time: 26.8 seconds — ABNORMAL HIGH (ref 11.4–15.2)

## 2016-08-27 LAB — COOXEMETRY PANEL
Carboxyhemoglobin: 1.5 % (ref 0.5–1.5)
Methemoglobin: 0.7 % (ref 0.0–1.5)
O2 SAT: 57.9 %
Total hemoglobin: 14.1 g/dL (ref 12.0–16.0)

## 2016-08-27 MED ORDER — WARFARIN SODIUM 5 MG PO TABS
5.0000 mg | ORAL_TABLET | Freq: Once | ORAL | Status: AC
  Administered 2016-08-27: 5 mg via ORAL
  Filled 2016-08-27: qty 1

## 2016-08-27 NOTE — Progress Notes (Signed)
Progress Note  Patient Name: Adrian Lambert Date of Encounter: 08/27/2016  Primary Cardiologist: [place]  Subjective   Was able to walk with physical therapy yesterday. He did have some oxygen desaturation, but rebounded quickly. Continues to have good urine output even after switching to oral diuretics. Sodium levels continue to improve, slight deterioration in creatinine.  Plan to wean off milrinone today. Atrial fibrillation with controlled ventricular rate in the 70s on the monitor.  Inpatient Medications    Scheduled Meds: . allopurinol  300 mg Oral Daily  . aspirin  81 mg Oral Daily  . digoxin  0.125 mg Oral Daily  . fesoterodine  4 mg Oral Daily  . furosemide  120 mg Oral BID  . mouth rinse  15 mL Mouth Rinse BID  . metoprolol succinate  25 mg Oral Daily  . sodium chloride flush  10-40 mL Intracatheter Q12H  . sodium chloride flush  3 mL Intravenous Q12H  . sodium chloride flush  3 mL Intravenous Q12H  . spironolactone  12.5 mg Oral Daily  . tamsulosin  0.4 mg Oral QODAY  . Warfarin - Pharmacist Dosing Inpatient   Does not apply q1800   Continuous Infusions: . sodium chloride    . amiodarone 30 mg/hr (08/27/16 [id number])  . milrinone 0.25 mcg/kg/min (08/26/16 2351)   PRN Meds: sodium chloride, acetaminophen, clotrimazole, guaiFENesin-dextromethorphan, menthol-cetylpyridinium, ondansetron (ZOFRAN) IV, sodium chloride flush, sodium chloride flush, sodium chloride flush   Vital Signs    Vitals:   08/27/16 0402 08/27/16 0455 08/27/16 0833 08/27/16 0838  BP: (!) 86/68 (!) 89/65 (!) 96/58   Pulse: 70 71 65 73  Resp: 18 17 16 18   Temp: 98.1 F (36.7 C)   97.2 F (36.2 C)  TempSrc: Oral   Oral  SpO2: 94% 93% 94% 96%  Weight: 97.8 kg (215 lb 11.2 oz)     Height:        Intake/Output Summary (Last 24 hours) at 08/27/16 1015 Last data filed at 08/27/16 0500  Gross per 24 hour  Intake           977.78 ml  Output             2025 ml  Net         -1047.22 ml    Filed Weights   08/25/16 0809 08/26/16 0455 08/27/16 0402  Weight: 99.4 kg (219 lb 1.6 oz) 100.2 kg (220 lb 14.4 oz) 97.8 kg (215 lb 11.2 oz)    Telemetry    Atrial fibrillation with controlled rate - Personally Reviewed   Physical Exam  Alert and oriented, comfortable GEN: No acute distress.   Neck: No JVD Cardiac: Irregular, no murmurs, rubs, or gallops.  Respiratory: Clear to auscultation bilaterally. GI: Soft, nontender, non-distended  MS: No calf edema, 1+ ankle edema; No deformity. Neuro:  Nonfocal  Psych: Normal affect   Labs    Chemistry Recent Labs Lab 08/25/16 0513 08/26/16 0436 08/27/16 0500  NA 129* 130* 132*  K 3.4* 3.7 3.4*  CL 77* 81* 81*  CO2 39* 39* 37*  GLUCOSE 121* 127* 122*  BUN 26* 26* 29*  CREATININE 1.31* 1.41* 1.47*  CALCIUM 9.1 9.3 9.2  GFRNONAA 52* 47* 45*  GFRAA 60* 55* 52*  ANIONGAP 13 10 14      Hematology Recent Labs Lab 08/24/16 0430 08/25/16 0513 08/26/16 0436  WBC 14.7* 14.9* 13.4*  RBC 6.76* 6.96* 6.88*  HGB 12.9* 13.4 13.4  HCT 40.3 41.4 40.9  MCV 59.6*  59.5* 59.4*  MCH 19.1* 19.3* 19.5*  MCHC 32.0 32.4 32.8  RDW 16.3* 16.4* 16.7*  PLT 184 176 181    Radiology    No results found.  Cardiac Studies   Echo 08/11/2016 - Left ventricle: The cavity size was normal. Wall thickness was increased in a pattern of mild LVH. Systolic function was severely reduced. The estimated ejection fraction was in the range of 25% to 30%. - Aortic valve: A bioprosthesis was present. Valve area (VTI): 1.72 cm^2. Valve area (Vmax): 1.74 cm^2. Valve area (Vmean): 1.75 cm^2. - Right atrium: The atrium was mildly dilated. - Pulmonary arteries: Systolic pressure was mildly increased. PA peak pressure: 33 mm Hg (S).  Impressions:  - Poor quality echo. The LV endocardium is not well seen. With contrast, the LV function is seen to be severely depressed. The prosthetic AV is not well seen .  TEE  01/262018  - Left ventricle: Systolic function was severely reduced. The estimated ejection fraction was in the range of 20% to 25%. - Aortic valve: A mechanical prosthesis was present. There was trivial regurgitation. - Mitral valve: No evidence of vegetation. There was moderate regurgitation. - Left atrium: No evidence of thrombus in the atrial cavity or appendage. - Atrial septum: There was a small patent foramen ovale. There was a bidirectional shunt through a patent foramen ovale.  Patient Profile     76 y.o. male with severe cardiomyopathy (EF 25%) and acute decompensation of chronic heart failure in the setting of atrial fibrillation of uncertain duration and previous history of aortic valve replacement with a mechanical prosthesis.  Assessment & Plan    1. Acute on chronic systolic heart failure: He has lost about 10 kg since admission, appears to be approaching euvolemia. Hyponatremia is improving. Switched to oral diuretics. Will wean off milrinone today. Hopefully most of the problem is tachycardia related cardiomyopathy and will see recovery of left ventricular systolic function. Before current events, estimated EF was around 40%. 2. AFib: Scheduled for another attempt at cardioversion tomorrow afternoon with Dr. Benjamine Mola. Will keep on intravenous amiodarone until then. This procedure has been fully reviewed with the patient and written informed consent has been obtained. 3. Acute on chronic renal insufficiency: Slight worsening of renal function, but suspect trend will reverse since we have switched him to oral diuretics. 4. Mechanical AVR: Normal function by echo and TEE. Continue warfarin anticoagulation.  Signed, Sanda Klein, MD  08/27/2016, 10:15 AM

## 2016-08-27 NOTE — Progress Notes (Signed)
ANTICOAGULATION CONSULT NOTE - Follow Up Consult  Pharmacy Consult for Coumadin Indication: mechanical AVR and afib  Allergies  Allergen Reactions  . Celecoxib Rash and Other (See Comments)    Celebrex    Patient Measurements: Height: 5\' 10"  (177.8 cm) Weight: 215 lb 11.2 oz (97.8 kg) IBW/kg (Calculated) : 73  Vital Signs: Temp: 97.2 F (36.2 C) (02/04 0838) Temp Source: Oral (02/04 0838) BP: 96/58 (02/04 0833) Pulse Rate: 73 (02/04 0838)  Labs:  Recent Labs  08/25/16 0513 08/26/16 0436 08/27/16 0500  HGB 13.4 13.4  --   HCT 41.4 40.9  --   PLT 176 181  --   LABPROT 25.7* 26.5* 26.8*  INR 2.30 2.39 2.43  CREATININE 1.31* 1.41* 1.47*    Estimated Creatinine Clearance: 50.9 mL/min (by C-G formula based on SCr of 1.47 mg/dL (H)).  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   INR is therapeutic today at 2.43.  New to amiodarone this admission., has had variable Coumadin doses.    Most recent outpatient regimen is 2.5 mg on MWFSun and 5 mg on TTSat.   Wife and patient report previously on 5 mg daily until 10-day Prednisone course, beginning 07/14/16, which effected INR.  Goal of Therapy:  INR 2-3 per patient report Monitor platelets by anticoagulation protocol: Yes   Plan:   Coumadin 5 mg again today.  Daily PT/INR.  Arty Baumgartner, [place] Pager: [phone] 08/27/2016,12:40 PM

## 2016-08-28 ENCOUNTER — Encounter (HOSPITAL_COMMUNITY): Payer: Self-pay | Admitting: *Deleted

## 2016-08-28 ENCOUNTER — Inpatient Hospital Stay (HOSPITAL_COMMUNITY): Payer: Medicare Other | Admitting: Certified Registered Nurse Anesthetist

## 2016-08-28 ENCOUNTER — Encounter (HOSPITAL_COMMUNITY)
Admission: EM | Disposition: A | Discharge status: Home / Self Care | Payer: Self-pay | Source: Home / Self Care | Attending: Cardiology

## 2016-08-28 DIAGNOSIS — I4891 Unspecified atrial fibrillation: Secondary | ICD-10-CM

## 2016-08-28 HISTORY — PX: CARDIOVERSION: SHX1299

## 2016-08-28 LAB — BASIC METABOLIC PANEL
Anion gap: 10 (ref 5–15)
BUN: 28 mg/dL — AB (ref 6–20)
CO2: 36 mmol/L — ABNORMAL HIGH (ref 22–32)
CREATININE: 1.41 mg/dL — AB (ref 0.61–1.24)
Calcium: 9 mg/dL (ref 8.9–10.3)
Chloride: 86 mmol/L — ABNORMAL LOW (ref 101–111)
GFR calc Af Amer: 55 mL/min — ABNORMAL LOW (ref >60)
GFR, EST NON AFRICAN AMERICAN: 47 mL/min — AB (ref >60)
Glucose, Bld: 116 mg/dL — ABNORMAL HIGH (ref 65–99)
Potassium: 3.7 mmol/L (ref 3.5–5.1)
SODIUM: 132 mmol/L — AB (ref 135–145)

## 2016-08-28 LAB — PROTIME-INR
INR: 2.67
Prothrombin Time: 28.9 seconds — ABNORMAL HIGH (ref 11.4–15.2)

## 2016-08-28 LAB — COOXEMETRY PANEL
Carboxyhemoglobin: 0.8 % (ref 0.5–1.5)
METHEMOGLOBIN: 0.9 % (ref 0.0–1.5)
O2 SAT: 53 %
TOTAL HEMOGLOBIN: 14.5 g/dL (ref 12.0–16.0)

## 2016-08-28 SURGERY — CARDIOVERSION
Anesthesia: General

## 2016-08-28 MED ORDER — LIDOCAINE 2% (20 MG/ML) 5 ML SYRINGE
INTRAMUSCULAR | Status: DC | PRN
  Administered 2016-08-28: 60 mg via INTRAVENOUS

## 2016-08-28 MED ORDER — PROPOFOL 10 MG/ML IV BOLUS
INTRAVENOUS | Status: DC | PRN
  Administered 2016-08-28: 60 mg via INTRAVENOUS
  Administered 2016-08-28: 20 mg via INTRAVENOUS

## 2016-08-28 MED ORDER — WARFARIN SODIUM 2.5 MG PO TABS
2.5000 mg | ORAL_TABLET | Freq: Once | ORAL | Status: AC
  Administered 2016-08-28: 2.5 mg via ORAL
  Filled 2016-08-28: qty 1

## 2016-08-28 MED ORDER — TORSEMIDE 20 MG PO TABS
60.0000 mg | ORAL_TABLET | Freq: Every day | ORAL | Status: DC
  Administered 2016-08-29: 60 mg via ORAL
  Filled 2016-08-28: qty 3

## 2016-08-28 NOTE — H&P (View-Only) (Signed)
Patient ID: Adrian Lambert, male   DOB: 07-15-1941, 76 y.o.   MRN: [id number]   SUBJECTIVE:  Patient was admitted on 1/23 with afib/RVR and volume overload.  Fairly minimal diuresis initially.  DCCV attempted on amiodarone on 1/26 but went back into atrial fibrillation.    He was started on milrinone for low output and diuresed well, CVP down to 5-6.  22 lbs down total.  Transitioned to po yesterday.  Milrinone stopped yesterday.  Co-ox lower at 53% off milrinone.  He remains in atrial fibrillation.   Denies SOB.    TEE (1/18): EF 20-25%, mechanical AoV ok.   Scheduled Meds: . allopurinol  300 mg Oral Daily  . aspirin  81 mg Oral Daily  . digoxin  0.125 mg Oral Daily  . fesoterodine  4 mg Oral Daily  . mouth rinse  15 mL Mouth Rinse BID  . metoprolol succinate  25 mg Oral Daily  . sodium chloride flush  10-40 mL Intracatheter Q12H  . sodium chloride flush  3 mL Intravenous Q12H  . sodium chloride flush  3 mL Intravenous Q12H  . spironolactone  12.5 mg Oral Daily  . tamsulosin  0.4 mg Oral QODAY  . [START ON 08/29/2016] torsemide  60 mg Oral Daily  . warfarin  2.5 mg Oral ONCE-1800  . Warfarin - Pharmacist Dosing Inpatient   Does not apply q1800   Continuous Infusions: . sodium chloride    . amiodarone 30 mg/hr (08/27/16 1815)   PRN Meds:.sodium chloride, acetaminophen, clotrimazole, guaiFENesin-dextromethorphan, menthol-cetylpyridinium, ondansetron (ZOFRAN) IV, sodium chloride flush, sodium chloride flush, sodium chloride flush    Vitals:   08/28/16 0500 08/28/16 0519 08/28/16 0807 08/28/16 0835  BP:  (!) 88/66 96/69 96/73   Pulse: 61 69 67 65  Resp: 17 16 18    Temp: 98.1 F (36.7 C)  98.3 F (36.8 C)   TempSrc: Oral  Oral   SpO2: 97% 94% 97%   Weight: 214 lb 4.8 oz (97.2 kg)     Height:        Intake/Output Summary (Last 24 hours) at 08/28/16 0844 Last data filed at 08/28/16 0800  Gross per 24 hour  Intake          1420.15 ml  Output             2900 ml  Net          -1479.85 ml    LABS: Basic Metabolic Panel:  Recent Labs  08/27/16 0500 08/28/16 0500  NA 132* 132*  K 3.4* 3.7  CL 81* 86*  CO2 37* 36*  GLUCOSE 122* 116*  BUN 29* 28*  CREATININE 1.47* 1.41*  CALCIUM 9.2 9.0   Liver Function Tests: No results for input(s): AST, ALT, ALKPHOS, BILITOT, PROT, ALBUMIN in the last 72 hours. No results for input(s): LIPASE, AMYLASE in the last 72 hours. CBC:  Recent Labs  08/26/16 0436  WBC 13.4*  NEUTROABS 10.9*  HGB 13.4  HCT 40.9  MCV 59.4*  PLT 181   Cardiac Enzymes: No results for input(s): CKTOTAL, CKMB, CKMBINDEX, TROPONINI in the last 72 hours. BNP: Invalid input(s): POCBNP D-Dimer: No results for input(s): DDIMER in the last 72 hours. Hemoglobin A1C: No results for input(s): HGBA1C in the last 72 hours. Fasting Lipid Panel: No results for input(s): CHOL, HDL, LDLCALC, TRIG, CHOLHDL, LDLDIRECT in the last 72 hours. Thyroid Function Tests: No results for input(s): TSH, T4TOTAL, T3FREE, THYROIDAB in the last 72 hours.  Invalid input(s): FREET3 Anemia  Panel: No results for input(s): VITAMINB12, FOLATE, FERRITIN, TIBC, IRON, RETICCTPCT in the last 72 hours.  RADIOLOGY: Dg Chest 2 View  Result Date: 08/16/2016 CLINICAL DATA:  CHF, cough, hypertension EXAM: CHEST  2 VIEW COMPARISON:  PA and lateral chest x-ray of August 15, 2016 FINDINGS: The lungs are reasonably well inflated. The interstitial markings remain increased diffusely with areas of patchy density noted bilaterally. There small pleural effusions bilaterally. The cardiac silhouette is enlarged. The pulmonary vascularity is slightly less engorged. The patient has undergone previous aortic valve replacement. There is calcification in the wall of the aortic arch. The observed bony thorax exhibits no acute abnormality. IMPRESSION: CHF with interstitial edema and small bilateral pleural effusions little change since yesterday's study. Thoracic aortic atherosclerosis.  Electronically Signed   By: David  Martinique M.D.   On: 08/16/2016 08:24   Dg Chest 2 View  Result Date: 08/15/2016 CLINICAL DATA:  3-4 weeks of cough. Treated for bronchitis. History of CHF, aortic stenosis, and atrial fibrillation. EXAM: CHEST  2 VIEW COMPARISON:  Chest x-ray of June 12, 2016 FINDINGS: The lungs are reasonably well inflated. The interstitial markings are chronically increased but have become more conspicuous since the previous study. The cardiac silhouette is enlarged. The pulmonary vascularity is more congested centrally. There are small bilateral pleural effusions. The patient has undergone previous aortic valve replacement. There is calcification in the wall of the aortic arch. The bony thorax exhibits no acute abnormality. IMPRESSION: CHF with interstitial edema and small bilateral pleural effusions. There may be superimposed pneumonia in the appropriate clinical setting. Electronically Signed   By: David  Martinique M.D.   On: 08/15/2016 12:34   Dg Chest Port 1 View  Result Date: 08/21/2016 CLINICAL DATA:  Confirm line placement. EXAM: PORTABLE CHEST 1 VIEW COMPARISON:  Chest x-ray dated 08/16/2016. FINDINGS: Right-sided PICC line now in place with tip well positioned at the level of the lower SVC. Cardiomegaly appears stable. Overall cardiomediastinal silhouette appears stable in size and configuration. Atherosclerotic changes noted at the aortic arch. Patchy bilateral airspace opacities are unchanged. There are probable small bilateral pleural effusions which are unchanged. IMPRESSION: 1. Right-sided PICC line now in place with tip well-positioned at the level of the lower SVC. No evidence of procedural complicating feature. 2. Stable cardiomegaly with bilateral interstitial prominence suggesting CHF/volume overload. Probable small bilateral pleural effusions. No new lung findings. 3. Aortic atherosclerosis. Electronically Signed   By: Franki Cabot M.D.   On: 08/21/2016 21:18     PHYSICAL EXAM CVP ~4-5 General: NAD Neck: JVP 5-6 cm, no thyromegaly or thyroid nodule.  Lungs: decreased LLL  CV: Nondisplaced PMI.  Heart irregular S1/S2, mechanical S2, no S3/S4, 2/6 early SEM RUSB.  1+ edema 3/4 to knees.   Abdomen: Soft, nontender, no hepatosplenomegaly, moderate distention.  Neurologic: Alert and oriented x 3.  Psych: Normal affect. Extremities: No clubbing or cyanosis. RUE PICC   TELEMETRY: A fib 70s   ASSESSMENT AND PLAN: 76 yo with history of mechanical aortic valve and prior echo (7/17) with EF 40-45% was admitted with atrial fibrillation/RVR and acute on chronic systolic CHF.  1. Acute on chronic systolic CHF: EF on TEE this admission 20-25%, mechanical aortic valve ok.  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  No chest pain.  He was short of breath with minimal exertion.  PICC placed, co-ox 44% initially. Co-ox rose to 60% after starting milrinone 0.25. He has been  diuresed, weight down total 22 lbs with CVP 5-6.  Milrinone stopped 2/4, co-ox lower at 53% today.  - Will use torsemide 60 mg daily for home. - Leave off milrinone.  - Continue digoxin 0.125 daily, check level in am.  - Continue spironolactone 12.5 daily.   - Can continue low dose Toprol XL.  - Eventually add ARB. BP remains soft will not add for now. - Continue ted hose.    - Continue cardiac rehab.  - Ideally, would get him back in NSR.  Will attempt DCCV tomorrow now that off milrinone and fully diuresed. - Needs eventual workup for CAD.  Given no ACS/chest pain, reasonable to do DCCV to NSR and wait a couple of months with repeat echo.  If EF remains low, can then hold anticoagulation for cath.  2. Mechanical aortic valve: Continue warfarin and should be on ASA 81. Valve looked ok on TEE. 3. Atrial fibrillation: Admitted with new atrial fibrillation with RVR and CHF.  EF down to 20-25%.  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  Need to get  him back in NSR.  DCCV on 1/26 but back to afib after.  Rate controlled now.  - DCCV today now that off milrinone and diuresed.  Hopefully will hold better after more amiodarone loading.  - Continue warfarin. INR therapeutic.  - Continue IV amiodarone until after DCCV.  4. AKI: Creatinine 1.4, now on po diuretics.   [name] 08/28/2016 8:44 AM

## 2016-08-28 NOTE — Care Management Important Message (Signed)
Important Message  Patient Details  Name: Adrian Lambert MRN: [id number] Date of Birth: Oct 04, 1940   Medicare Important Message Given:  Yes    Orbie Pyo 08/28/2016, 4:16 PM

## 2016-08-28 NOTE — Progress Notes (Signed)
CARDIAC REHAB PHASE I   PRE:  Rate/Rhythm: 62 afib  BP:  Supine:   Sitting: 90/62  Standing:    SaO2: 92-95%RA  MODE:  Ambulation: 500 ft   POST:  Rate/Rhythm: 84 afib  BP:  Supine:   Sitting: 111/84  Standing:    SaO2: 96%RA 1011-1053 Pt walked 500 ft on RA with rolling walker and I monitored IV pole. Pt tolerated well. Pt stated he might go home today after procedure. I reviewed zones of CHF booklet  with pt and stressed when to call MD. Encouraged daily weights and reviewed when to call MD with weight gain. Gave pt low sodium diets and stressed 2000 mg restriction. Offered CRP 2 but pt declined as he has attended before and wants to walk on his own. Tried to put CHF video on for pt to view but system not working at this time.   Graylon Good, RN BSN  08/28/2016 10:48 AM

## 2016-08-28 NOTE — Progress Notes (Signed)
Patient ID: Adrian Lambert, male   DOB: [birth year], 76 y.o.   MRN: [id number]   SUBJECTIVE:  Patient was admitted on 1/23 with afib/RVR and volume overload.  Fairly minimal diuresis initially.  DCCV attempted on amiodarone on 1/26 but went back into atrial fibrillation.    He was started on milrinone for low output and diuresed well, CVP down to 5-6.  22 lbs down total.  Transitioned to po yesterday.  Milrinone stopped yesterday.  Co-ox lower at 53% off milrinone.  He remains in atrial fibrillation.   Denies SOB.    TEE (1/18): EF 20-25%, mechanical AoV ok.   Scheduled Meds: . allopurinol  300 mg Oral Daily  . aspirin  81 mg Oral Daily  . digoxin  0.125 mg Oral Daily  . fesoterodine  4 mg Oral Daily  . mouth rinse  15 mL Mouth Rinse BID  . metoprolol succinate  25 mg Oral Daily  . sodium chloride flush  10-40 mL Intracatheter Q12H  . sodium chloride flush  3 mL Intravenous Q12H  . sodium chloride flush  3 mL Intravenous Q12H  . spironolactone  12.5 mg Oral Daily  . tamsulosin  0.4 mg Oral QODAY  . [START ON 08/29/2016] torsemide  60 mg Oral Daily  . warfarin  2.5 mg Oral ONCE-1800  . Warfarin - Pharmacist Dosing Inpatient   Does not apply q1800   Continuous Infusions: . sodium chloride    . amiodarone 30 mg/hr (08/27/16 1815)   PRN Meds:.sodium chloride, acetaminophen, clotrimazole, guaiFENesin-dextromethorphan, menthol-cetylpyridinium, ondansetron (ZOFRAN) IV, sodium chloride flush, sodium chloride flush, sodium chloride flush    Vitals:   08/28/16 0500 08/28/16 0519 08/28/16 0807 08/28/16 0835  BP:  (!) 88/66 96/69 96/73   Pulse: 61 69 67 65  Resp: 17 16 18    Temp: 98.1 F (36.7 C)  98.3 F (36.8 C)   TempSrc: Oral  Oral   SpO2: 97% 94% 97%   Weight: 214 lb 4.8 oz (97.2 kg)     Height:        Intake/Output Summary (Last 24 hours) at 08/28/16 0844 Last data filed at 08/28/16 0800  Gross per 24 hour  Intake          1420.15 ml  Output             2900 ml  Net          -1479.85 ml    LABS: Basic Metabolic Panel:  Recent Labs  08/27/16 0500 08/28/16 0500  NA 132* 132*  K 3.4* 3.7  CL 81* 86*  CO2 37* 36*  GLUCOSE 122* 116*  BUN 29* 28*  CREATININE 1.47* 1.41*  CALCIUM 9.2 9.0   Liver Function Tests: No results for input(s): AST, ALT, ALKPHOS, BILITOT, PROT, ALBUMIN in the last 72 hours. No results for input(s): LIPASE, AMYLASE in the last 72 hours. CBC:  Recent Labs  08/26/16 0436  WBC 13.4*  NEUTROABS 10.9*  HGB 13.4  HCT 40.9  MCV 59.4*  PLT 181   Cardiac Enzymes: No results for input(s): CKTOTAL, CKMB, CKMBINDEX, TROPONINI in the last 72 hours. BNP: Invalid input(s): POCBNP D-Dimer: No results for input(s): DDIMER in the last 72 hours. Hemoglobin A1C: No results for input(s): HGBA1C in the last 72 hours. Fasting Lipid Panel: No results for input(s): CHOL, HDL, LDLCALC, TRIG, CHOLHDL, LDLDIRECT in the last 72 hours. Thyroid Function Tests: No results for input(s): TSH, T4TOTAL, T3FREE, THYROIDAB in the last 72 hours.  Invalid input(s): FREET3 Anemia  Panel: No results for input(s): VITAMINB12, FOLATE, FERRITIN, TIBC, IRON, RETICCTPCT in the last 72 hours.  RADIOLOGY: Dg Chest 2 View  Result Date: 08/16/2016 CLINICAL DATA:  CHF, cough, hypertension EXAM: CHEST  2 VIEW COMPARISON:  PA and lateral chest x-ray of August 15, 2016 FINDINGS: The lungs are reasonably well inflated. The interstitial markings remain increased diffusely with areas of patchy density noted bilaterally. There small pleural effusions bilaterally. The cardiac silhouette is enlarged. The pulmonary vascularity is slightly less engorged. The patient has undergone previous aortic valve replacement. There is calcification in the wall of the aortic arch. The observed bony thorax exhibits no acute abnormality. IMPRESSION: CHF with interstitial edema and small bilateral pleural effusions little change since yesterday's study. Thoracic aortic atherosclerosis.  Electronically Signed   By: David  Martinique M.D.   On: 08/16/2016 08:24   Dg Chest 2 View  Result Date: 08/15/2016 CLINICAL DATA:  3-4 weeks of cough. Treated for bronchitis. History of CHF, aortic stenosis, and atrial fibrillation. EXAM: CHEST  2 VIEW COMPARISON:  Chest x-ray of June 12, 2016 FINDINGS: The lungs are reasonably well inflated. The interstitial markings are chronically increased but have become more conspicuous since the previous study. The cardiac silhouette is enlarged. The pulmonary vascularity is more congested centrally. There are small bilateral pleural effusions. The patient has undergone previous aortic valve replacement. There is calcification in the wall of the aortic arch. The bony thorax exhibits no acute abnormality. IMPRESSION: CHF with interstitial edema and small bilateral pleural effusions. There may be superimposed pneumonia in the appropriate clinical setting. Electronically Signed   By: David  Martinique M.D.   On: 08/15/2016 12:34   Dg Chest Port 1 View  Result Date: 08/21/2016 CLINICAL DATA:  Confirm line placement. EXAM: PORTABLE CHEST 1 VIEW COMPARISON:  Chest x-ray dated 08/16/2016. FINDINGS: Right-sided PICC line now in place with tip well positioned at the level of the lower SVC. Cardiomegaly appears stable. Overall cardiomediastinal silhouette appears stable in size and configuration. Atherosclerotic changes noted at the aortic arch. Patchy bilateral airspace opacities are unchanged. There are probable small bilateral pleural effusions which are unchanged. IMPRESSION: 1. Right-sided PICC line now in place with tip well-positioned at the level of the lower SVC. No evidence of procedural complicating feature. 2. Stable cardiomegaly with bilateral interstitial prominence suggesting CHF/volume overload. Probable small bilateral pleural effusions. No new lung findings. 3. Aortic atherosclerosis. Electronically Signed   By: Franki Cabot M.D.   On: 08/21/2016 21:18     PHYSICAL EXAM CVP ~4-5 General: NAD Neck: JVP 5-6 cm, no thyromegaly or thyroid nodule.  Lungs: decreased LLL  CV: Nondisplaced PMI.  Heart irregular S1/S2, mechanical S2, no S3/S4, 2/6 early SEM RUSB.  1+ edema 3/4 to knees.   Abdomen: Soft, nontender, no hepatosplenomegaly, moderate distention.  Neurologic: Alert and oriented x 3.  Psych: Normal affect. Extremities: No clubbing or cyanosis. RUE PICC   TELEMETRY: A fib 70s   ASSESSMENT AND PLAN: 76 yo with history of mechanical aortic valve and prior echo (7/17) with EF 40-45% was admitted with atrial fibrillation/RVR and acute on chronic systolic CHF.  1. Acute on chronic systolic CHF: EF on TEE this admission 20-25%, mechanical aortic valve ok.  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  No chest pain.  He was short of breath with minimal exertion.  PICC placed, co-ox 44% initially. Co-ox rose to 60% after starting milrinone 0.25. He has been  diuresed, weight down total 22 lbs with CVP 5-6.  Milrinone stopped 2/4, co-ox lower at 53% today.  - Will use torsemide 60 mg daily for home. - Leave off milrinone.  - Continue digoxin 0.125 daily, check level in am.  - Continue spironolactone 12.5 daily.   - Can continue low dose Toprol XL.  - Eventually add ARB. BP remains soft will not add for now. - Continue ted hose.    - Continue cardiac rehab.  - Ideally, would get him back in NSR.  Will attempt DCCV tomorrow now that off milrinone and fully diuresed. - Needs eventual workup for CAD.  Given no ACS/chest pain, reasonable to do DCCV to NSR and wait a couple of months with repeat echo.  If EF remains low, can then hold anticoagulation for cath.  2. Mechanical aortic valve: Continue warfarin and should be on ASA 81. Valve looked ok on TEE. 3. Atrial fibrillation: Admitted with new atrial fibrillation with RVR and CHF.  EF down to 20-25%.  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  Need to get  him back in NSR.  DCCV on 1/26 but back to afib after.  Rate controlled now.  - DCCV today now that off milrinone and diuresed.  Hopefully will hold better after more amiodarone loading.  - Continue warfarin. INR therapeutic.  - Continue IV amiodarone until after DCCV.  4. AKI: Creatinine 1.4, now on po diuretics.   [name] 08/28/2016 8:44 AM

## 2016-08-28 NOTE — Transfer of Care (Signed)
Immediate Anesthesia Transfer of Care Note  Patient: Adrian Lambert  Procedure(s) Performed: Procedure(s): CARDIOVERSION (N/A)  Patient Location: Endoscopy Unit  Anesthesia Type:General  Level of Consciousness: awake  Airway & Oxygen Therapy: Patient Spontanous Breathing  Post-op Assessment: Report given to RN and Post -op Vital signs reviewed and stable  Post vital signs: Reviewed and stable  Last Vitals:  Vitals:   08/28/16 1154 08/28/16 1250  BP: 98/75 106/81  Pulse: 61 66  Resp: 18 19  Temp: 37 C 36.6 C    Last Pain:  Vitals:   08/28/16 1250  TempSrc: Oral  PainSc:       Patients Stated Pain Goal: 0 ([id number])  Complications: No apparent anesthesia complications

## 2016-08-28 NOTE — Progress Notes (Signed)
ANTICOAGULATION CONSULT NOTE - Follow Up Consult  Pharmacy Consult for Coumadin Indication: mechanical AVR and afib  Allergies  Allergen Reactions  . Celecoxib Rash and Other (See Comments)    Celebrex    Patient Measurements: Height: 5\' 10"  (177.8 cm) Weight: 214 lb 4.8 oz (97.2 kg) IBW/kg (Calculated) : 73  Vital Signs: Temp: 98.3 F (36.8 C) (02/05 0807) Temp Source: Oral (02/05 0807) BP: 96/69 (02/05 0807) Pulse Rate: 67 (02/05 0807)  Labs:  Recent Labs  08/26/16 0436 08/27/16 0500 08/28/16 0500  HGB 13.4  --   --   HCT 40.9  --   --   PLT 181  --   --   LABPROT 26.5* 26.8* 28.9*  INR 2.39 2.43 2.67  CREATININE 1.41* 1.47* 1.41*    Estimated Creatinine Clearance: 53 mL/min (by C-G formula based on SCr of 1.41 mg/dL (H)).   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  INR therapeutic; no bleeding reported.  Most recent outpatient regimen is 2.5 mg on MWFSun and 5 mg on TTSat.   Goal of Therapy:  INR 2-3 per patient report    Plan:  - Coumadin 2.5mg  PO today - Daily PT / INR - F/U checking digoxin level, add ACEi/ARB when appropriate    Royal Vandevoort D. Mina Marble, PharmD, BCPS Pager:  [phone] 08/28/2016, 8:26 AM

## 2016-08-28 NOTE — Anesthesia Postprocedure Evaluation (Signed)
Anesthesia Post Note  Patient: Adrian Lambert  Procedure(s) Performed: Procedure(s) (LRB): CARDIOVERSION (N/A)  Patient location during evaluation: PACU Anesthesia Type: General Level of consciousness: awake Pain management: pain level controlled Vital Signs Assessment: post-procedure vital signs reviewed and stable Anesthetic complications: no       Last Vitals:  Vitals:   08/28/16 1400 08/28/16 1410  BP: (P) 100/69 102/68  Pulse:  65  Resp:  20  Temp:      Last Pain:  Vitals:   08/28/16 1357  TempSrc: Oral  PainSc:                  Leatta Alewine

## 2016-08-28 NOTE — Procedures (Signed)
Electrical Cardioversion Procedure Note Adrian Lambert [id number] 06-13-1941  Procedure: Electrical Cardioversion Indications:  Atrial Fibrillation  Procedure Details Consent: Risks of procedure as well as the alternatives and risks of each were explained to the (patient/caregiver).  Consent for procedure obtained. Time Out: Verified patient identification, verified procedure, site/side was marked, verified correct patient position, special equipment/implants available, medications/allergies/relevent history reviewed, required imaging and test results available.  Performed  Patient placed on cardiac monitor, pulse oximetry, supplemental oxygen as necessary.  Sedation given: Propofol per anesthesiology Pacer pads placed anterior and posterior chest.  Cardioverted 1 time(s).  Cardioverted at [name].  Evaluation Findings: Post procedure EKG shows: NSR Complications: None Patient did tolerate procedure well.   [name] 08/28/2016, 1:41 PM

## 2016-08-28 NOTE — Anesthesia Preprocedure Evaluation (Addendum)
Anesthesia Evaluation  Patient identified by MRN, date of birth, ID band Patient awake    Reviewed: Allergy & Precautions, NPO status , Patient's Chart, lab work & pertinent test results  History of Anesthesia Complications (+) PONV  Airway Mallampati: II  TM Distance: >3 FB     Dental   Pulmonary    breath sounds clear to auscultation       Cardiovascular hypertension, +CHF  + dysrhythmias  Rhythm:Irregular Rate:Normal     Neuro/Psych negative neurological ROS     GI/Hepatic Neg liver ROS, GERD  ,  Endo/Other  negative endocrine ROS  Renal/GU negative Renal ROS     Musculoskeletal  (+) Arthritis ,   Abdominal   Peds  Hematology   Anesthesia Other Findings   Reproductive/Obstetrics                             Anesthesia Physical Anesthesia Plan  ASA: III  Anesthesia Plan: General   Post-op Pain Management:    Induction: Intravenous  Airway Management Planned: Mask  Additional Equipment:   Intra-op Plan:   Post-operative Plan:   Informed Consent: I have reviewed the patients History and Physical, chart, labs and discussed the procedure including the risks, benefits and alternatives for the proposed anesthesia with the patient or authorized representative who has indicated his/her understanding and acceptance.   Dental advisory given  Plan Discussed with: CRNA, Anesthesiologist and Surgeon  Anesthesia Plan Comments:         Anesthesia Quick Evaluation

## 2016-08-28 NOTE — Interval H&P Note (Signed)
History and Physical Interval Note:  08/28/2016 1:34 PM  Adrian Lambert  has presented today for surgery, with the diagnosis of AFIB  The various methods of treatment have been discussed with the patient and family. After consideration of risks, benefits and other options for treatment, the patient has consented to  Procedure(s): CARDIOVERSION (N/A) as a surgical intervention .  The patient's history has been reviewed, patient examined, no change in status, stable for surgery.  I have reviewed the patient's chart and labs.  Questions were answered to the patient's satisfaction.     Adrian Lambert [employer]

## 2016-08-28 NOTE — Progress Notes (Signed)
Pharmacist Heart Failure Core Measure Documentation  Assessment: Adrian Lambert has an EF documented as 25-30% on ECHO by 08/11/16.  Rationale: Heart failure patients with left ventricular systolic dysfunction (LVSD) and an EF < 40% should be prescribed an angiotensin converting enzyme inhibitor (ACEI) or angiotensin receptor blocker (ARB) at discharge unless a contraindication is documented in the medical record.  This patient is not currently on an ACEI or ARB for HF.  This note is being placed in the record in order to provide documentation that a contraindication to the use of these agents is present for this encounter.  ACE Inhibitor or Angiotensin Receptor Blocker is contraindicated (specify all that apply)  []   ACEI allergy AND ARB allergy []   Angioedema []   Moderate or severe aortic stenosis []   Hyperkalemia [x]   Hypotension []   Renal artery stenosis [x]   Worsening renal function, preexisting renal disease or dysfunction   Legrand Lasser D. Mina Marble, PharmD, BCPS Pager:  [phone] 08/28/2016, 8:29 AM

## 2016-08-29 ENCOUNTER — Encounter (HOSPITAL_COMMUNITY): Payer: Self-pay | Admitting: Cardiology

## 2016-08-29 LAB — COOXEMETRY PANEL
Carboxyhemoglobin: 1.1 % (ref 0.5–1.5)
Methemoglobin: 1 % (ref 0.0–1.5)
O2 Saturation: 71.6 %
Total hemoglobin: 14.2 g/dL (ref 12.0–16.0)

## 2016-08-29 LAB — BASIC METABOLIC PANEL WITH GFR
Anion gap: 11 (ref 5–15)
BUN: 25 mg/dL — ABNORMAL HIGH (ref 6–20)
CO2: 36 mmol/L — ABNORMAL HIGH (ref 22–32)
Calcium: 8.9 mg/dL (ref 8.9–10.3)
Chloride: 87 mmol/L — ABNORMAL LOW (ref 101–111)
Creatinine, Ser: 1.34 mg/dL — ABNORMAL HIGH (ref 0.61–1.24)
GFR calc Af Amer: 58 mL/min — ABNORMAL LOW
GFR calc non Af Amer: 50 mL/min — ABNORMAL LOW
Glucose, Bld: 89 mg/dL (ref 65–99)
Potassium: 3.5 mmol/L (ref 3.5–5.1)
Sodium: 134 mmol/L — ABNORMAL LOW (ref 135–145)

## 2016-08-29 LAB — CBC
HEMATOCRIT: 41.9 % (ref 39.0–52.0)
Hemoglobin: 13.7 g/dL (ref 13.0–17.0)
MCH: 19.5 pg — AB (ref 26.0–34.0)
MCHC: 32.7 g/dL (ref 30.0–36.0)
MCV: 59.5 fL — AB (ref 78.0–100.0)
Platelets: 163 10*3/uL (ref 150–400)
RBC: 7.04 MIL/uL — AB (ref 4.22–5.81)
RDW: 17 % — ABNORMAL HIGH (ref 11.5–15.5)
WBC: 13.2 10*3/uL — ABNORMAL HIGH (ref 4.0–10.5)

## 2016-08-29 LAB — DIGOXIN LEVEL: Digoxin Level: 0.4 ng/mL — ABNORMAL LOW (ref 0.8–2.0)

## 2016-08-29 LAB — PROTIME-INR
INR: 3.38
PROTHROMBIN TIME: 35 s — AB (ref 11.4–15.2)

## 2016-08-29 MED ORDER — SPIRONOLACTONE 25 MG PO TABS: 12.5000 mg | ORAL_TABLET | Freq: Every day | ORAL | 6 refills | Status: DC

## 2016-08-29 MED ORDER — METOPROLOL SUCCINATE ER 25 MG PO TB24: 25.0000 mg | ORAL_TABLET | Freq: Every day | ORAL | 6 refills | Status: DC

## 2016-08-29 MED ORDER — DIGOXIN 125 MCG PO TABS: 0.1250 mg | ORAL_TABLET | Freq: Every day | ORAL | 6 refills | Status: DC

## 2016-08-29 MED ORDER — AMIODARONE HCL 200 MG PO TABS
200.0000 mg | ORAL_TABLET | Freq: Two times a day (BID) | ORAL | Status: DC
Start: 2016-08-29 — End: 2016-08-29
  Administered 2016-08-29: 200 mg via ORAL
  Filled 2016-08-29: qty 1

## 2016-08-29 MED ORDER — POTASSIUM CHLORIDE CRYS ER 20 MEQ PO TBCR
20.0000 meq | EXTENDED_RELEASE_TABLET | Freq: Every day | ORAL | Status: DC
  Administered 2016-08-29: 20 meq via ORAL
  Filled 2016-08-29: qty 1

## 2016-08-29 MED ORDER — ASPIRIN 81 MG PO CHEW: 81.0000 mg | CHEWABLE_TABLET | Freq: Every day | ORAL | 6 refills | Status: DC

## 2016-08-29 MED ORDER — POTASSIUM CHLORIDE CRYS ER 20 MEQ PO TBCR: 20.0000 meq | EXTENDED_RELEASE_TABLET | Freq: Every day | ORAL | 6 refills | Status: DC

## 2016-08-29 MED ORDER — AMIODARONE HCL 200 MG PO TABS: ORAL_TABLET | ORAL | 6 refills | Status: DC

## 2016-08-29 MED ORDER — TORSEMIDE 20 MG PO TABS: 60.0000 mg | ORAL_TABLET | Freq: Every day | ORAL | 6 refills | Status: DC

## 2016-08-29 MED ORDER — AMIODARONE HCL 200 MG PO TABS: 200.0000 mg | ORAL_TABLET | Freq: Two times a day (BID) | ORAL | 6 refills | Status: DC

## 2016-08-29 MED ORDER — WARFARIN SODIUM 5 MG PO TABS: 2.5000 mg | ORAL_TABLET | ORAL | 6 refills | Status: DC

## 2016-08-29 NOTE — Progress Notes (Signed)
Patient ID: Adrian Lambert, male   DOB: [birth year], 76 y.o.   MRN: [id number]   SUBJECTIVE:  Patient was admitted on 1/23 with afib/RVR and volume overload.  Fairly minimal diuresis initially.  DCCV attempted on amiodarone on 1/26 but went back into atrial fibrillation.    Yesterday he had successful DC-CV. On amio drip overnight.   Denies SOB.  Wants to go home   TEE (1/18): EF 20-25%, mechanical AoV ok.   Scheduled Meds: . allopurinol  300 mg Oral Daily  . aspirin  81 mg Oral Daily  . digoxin  0.125 mg Oral Daily  . fesoterodine  4 mg Oral Daily  . mouth rinse  15 mL Mouth Rinse BID  . metoprolol succinate  25 mg Oral Daily  . sodium chloride flush  10-40 mL Intracatheter Q12H  . spironolactone  12.5 mg Oral Daily  . tamsulosin  0.4 mg Oral QODAY  . torsemide  60 mg Oral Daily  . Warfarin - Pharmacist Dosing Inpatient   Does not apply q1800   Continuous Infusions: . amiodarone 30 mg/hr (08/29/16 0358)   PRN Meds:.sodium chloride, acetaminophen, clotrimazole, guaiFENesin-dextromethorphan, menthol-cetylpyridinium, ondansetron (ZOFRAN) IV, sodium chloride flush    Vitals:   08/28/16 1410 08/28/16 1944 08/29/16 0500 08/29/16 0805  BP: 102/68 96/61 97/62    Pulse: 65 64 70 64  Resp: 20 18 16 15   Temp: 97.8 F (36.6 C) 98.7 F (37.1 C) 98.4 F (36.9 C) 97.9 F (36.6 C)  TempSrc: Oral Oral Oral Oral  SpO2:  96% 94% 97%  Weight:   212 lb 14.4 oz (96.6 kg)   Height:        Intake/Output Summary (Last 24 hours) at 08/29/16 0815 Last data filed at 08/29/16 0400  Gross per 24 hour  Intake          1398.56 ml  Output             2175 ml  Net          -776.44 ml    LABS: Basic Metabolic Panel:  Recent Labs  08/28/16 0500 08/29/16 0437  NA 132* 134*  K 3.7 3.5  CL 86* 87*  CO2 36* 36*  GLUCOSE 116* 89  BUN 28* 25*  CREATININE 1.41* 1.34*  CALCIUM 9.0 8.9   Liver Function Tests: No results for input(s): AST, ALT, ALKPHOS, BILITOT, PROT, ALBUMIN in the last  72 hours. No results for input(s): LIPASE, AMYLASE in the last 72 hours. CBC:  Recent Labs  08/29/16 0437  WBC 13.2*  HGB 13.7  HCT 41.9  MCV 59.5*  PLT 163   Cardiac Enzymes: No results for input(s): CKTOTAL, CKMB, CKMBINDEX, TROPONINI in the last 72 hours. BNP: Invalid input(s): POCBNP D-Dimer: No results for input(s): DDIMER in the last 72 hours. Hemoglobin A1C: No results for input(s): HGBA1C in the last 72 hours. Fasting Lipid Panel: No results for input(s): CHOL, HDL, LDLCALC, TRIG, CHOLHDL, LDLDIRECT in the last 72 hours. Thyroid Function Tests: No results for input(s): TSH, T4TOTAL, T3FREE, THYROIDAB in the last 72 hours.  Invalid input(s): FREET3 Anemia Panel: No results for input(s): VITAMINB12, FOLATE, FERRITIN, TIBC, IRON, RETICCTPCT in the last 72 hours.  RADIOLOGY: Dg Chest 2 View  Result Date: 08/16/2016 CLINICAL DATA:  CHF, cough, hypertension EXAM: CHEST  2 VIEW COMPARISON:  PA and lateral chest x-ray of August 15, 2016 FINDINGS: The lungs are reasonably well inflated. The interstitial markings remain increased diffusely with areas of patchy density noted bilaterally. There  small pleural effusions bilaterally. The cardiac silhouette is enlarged. The pulmonary vascularity is slightly less engorged. The patient has undergone previous aortic valve replacement. There is calcification in the wall of the aortic arch. The observed bony thorax exhibits no acute abnormality. IMPRESSION: CHF with interstitial edema and small bilateral pleural effusions little change since yesterday's study. Thoracic aortic atherosclerosis. Electronically Signed   By: David  Martinique M.D.   On: 08/16/2016 08:24   Dg Chest 2 View  Result Date: 08/15/2016 CLINICAL DATA:  3-4 weeks of cough. Treated for bronchitis. History of CHF, aortic stenosis, and atrial fibrillation. EXAM: CHEST  2 VIEW COMPARISON:  Chest x-ray of June 12, 2016 FINDINGS: The lungs are reasonably well inflated. The  interstitial markings are chronically increased but have become more conspicuous since the previous study. The cardiac silhouette is enlarged. The pulmonary vascularity is more congested centrally. There are small bilateral pleural effusions. The patient has undergone previous aortic valve replacement. There is calcification in the wall of the aortic arch. The bony thorax exhibits no acute abnormality. IMPRESSION: CHF with interstitial edema and small bilateral pleural effusions. There may be superimposed pneumonia in the appropriate clinical setting. Electronically Signed   By: David  Martinique M.D.   On: 08/15/2016 12:34   Dg Chest Port 1 View  Result Date: 08/21/2016 CLINICAL DATA:  Confirm line placement. EXAM: PORTABLE CHEST 1 VIEW COMPARISON:  Chest x-ray dated 08/16/2016. FINDINGS: Right-sided PICC line now in place with tip well positioned at the level of the lower SVC. Cardiomegaly appears stable. Overall cardiomediastinal silhouette appears stable in size and configuration. Atherosclerotic changes noted at the aortic arch. Patchy bilateral airspace opacities are unchanged. There are probable small bilateral pleural effusions which are unchanged. IMPRESSION: 1. Right-sided PICC line now in place with tip well-positioned at the level of the lower SVC. No evidence of procedural complicating feature. 2. Stable cardiomegaly with bilateral interstitial prominence suggesting CHF/volume overload. Probable small bilateral pleural effusions. No new lung findings. 3. Aortic atherosclerosis. Electronically Signed   By: Franki Cabot M.D.   On: 08/21/2016 21:18    PHYSICAL EXAM CVP ~2-3 General: NAD Neck: JVP 5-6 cm, no thyromegaly or thyroid nodule.  Lungs: decreased LLL  CV: Nondisplaced PMI.  Heart irregular S1/S2, mechanical S2, no S3/S4, 2/6 early SEM RUSB.  Trace-1+ edema   Abdomen: Soft, nontender, no hepatosplenomegaly, moderate distention.  Neurologic: Alert and oriented x 3.  Psych: Normal  affect. Extremities: No clubbing or cyanosis. RUE PICC   TELEMETRY: NSR 60s   ASSESSMENT AND PLAN: 76 yo with history of mechanical aortic valve and prior echo (7/17) with EF 40-45% was admitted with atrial fibrillation/RVR and acute on chronic systolic CHF.  1. Acute on chronic systolic CHF: EF on TEE this admission 20-25%, mechanical aortic valve ok.  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  No chest pain.  He was short of breath with minimal exertion.  PICC placed, co-ox 44% initially. Co-ox rose to 60% after starting milrinone 0.25. He has been diuresed, weight down total 26 lbs with CVP 5-6.  Milrinone stopped 2/4, co-ox stable 72%.  Volume status stable.  - Continue current dose to torsemide.  - Leave off milrinone.  - Continue digoxin 0.125 daily, dig level 0.4  - Continue spironolactone 12.5 daily.   - Can continue low dose Toprol XL.  - Eventually add ARB. BP remains soft will not add for now. Consider as an outpatient.  -  Given no ACS/chest pain, and with restoration of NSR, will repeat ECHO in a couple months.  If pure tachy-mediated CMP, would expect EF to improve.  If EF remains low, should have coronary angiography.  2. Mechanical aortic valve: Continue warfarin and should be on ASA 81. Valve looked ok on TEE. 3. Atrial fibrillation: Admitted with new atrial fibrillation with RVR and CHF.  EF down to 20-25%.  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  Need to keep him in NSR.  DCCV on 1/26 but back to afib after.  Successful DCCV on 2/5, remains in NSR now.  - Continue warfarin. INR 3.38 . Hold coumadin today then resume current home regimen.  Recheck INR on Friday with Dr Rex Kras PCP.   -Stop IV amio. Start amio 200 mg twice a day.  4. AKI: Creatinine 1.34. Stable.  Home today. Follow up in HF clinic 09/12/16.     Amy Clegg NP-C  08/29/2016 8:15 AM   Patient seen with NP, agree with the above note.  He remains in NSR.  Rate  continues to trend down.  No dyspnea.  Would send home today, followup in 2 wks.  Has coumadin clinic followup later this week.  Cardiac meds for home: ASA 81, warfarin, spironolactone 12.5 daily, digoxin 0.125 daily, Toprol XL 25 daily, torsemide 60 daily, amiodarone 200 mg bid x 7 days then 200 daily, KCl 20 daily.   [name] 08/29/2016 9:06 AM

## 2016-08-29 NOTE — Discharge Instructions (Signed)
Heart Failure  Heart failure means your heart has trouble pumping blood. This makes it hard for your body to work well. Heart failure is usually a long-term (chronic) condition. You must take good care of yourself and follow your doctor's treatment plan.  HOME CARE   Take your heart medicine as told by your doctor.    Do not stop taking medicine unless your doctor tells you to.    Do not skip any dose of medicine.    Refill your medicines before they run out.    Take other medicines only as told by your doctor or pharmacist.   Stay active if told by your doctor. The elderly and people with severe heart failure should talk with a doctor about physical activity.   Eat heart-healthy foods. Choose foods that are without trans fat and are low in saturated fat, cholesterol, and salt (sodium). This includes fresh or frozen fruits and vegetables, fish, lean meats, fat-free or low-fat dairy foods, whole grains, and high-fiber foods. Lentils and dried peas and beans (legumes) are also good choices.   Limit salt if told by your doctor.   Cook in a healthy way. Roast, grill, broil, bake, poach, steam, or stir-fry foods.   Limit fluids as told by your doctor.   Weigh yourself every morning. Do this after you pee (urinate) and before you eat breakfast. Write down your weight to give to your doctor.   Take your blood pressure and write it down if your doctor tells you to.   Ask your doctor how to check your pulse. Check your pulse as told.   Lose weight if told by your doctor.   Stop smoking or chewing tobacco. Do not use gum or patches that help you quit without your doctor's approval.   Schedule and go to doctor visits as told.   Nonpregnant women should have no more than 1 drink a day. Men should have no more than 2 drinks a day. Talk to your doctor about drinking alcohol.   Stop illegal drug use.   Stay current with shots (immunizations).   Manage your health conditions as told by your doctor.   Learn to  manage your stress.   Rest when you are tired.   If it is really hot outside:    Avoid intense activities.    Use air conditioning or fans, or get in a cooler place.    Avoid caffeine and alcohol.    Wear loose-fitting, lightweight, and light-colored clothing.   If it is really cold outside:    Avoid intense activities.    Layer your clothing.    Wear mittens or gloves, a hat, and a scarf when going outside.    Avoid alcohol.   Learn about heart failure and get support as needed.   Get help to maintain or improve your quality of life and your ability to care for yourself as needed.  GET HELP IF:    You gain weight quickly.   You are more short of breath than usual.   You cannot do your normal activities.   You tire easily.   You cough more than normal, especially with activity.   You have any or more puffiness (swelling) in areas such as your hands, feet, ankles, or belly (abdomen).   You cannot sleep because it is hard to breathe.   You feel like your heart is beating fast (palpitations).   You get dizzy or light-headed when you stand up.  GET HELP   RIGHT AWAY IF:    You have trouble breathing.   There is a change in mental status, such as becoming less alert or not being able to focus.   You have chest pain or discomfort.   You faint.  MAKE SURE YOU:    Understand these instructions.   Will watch your condition.   Will get help right away if you are not doing well or get worse.     This information is not intended to replace advice given to you by your health care provider. Make sure you discuss any questions you have with your health care provider.     Document Released: 04/18/2008 Document Revised: 07/31/2014 Document Reviewed: 08/26/2012  Elsevier Interactive Patient Education 2017 [employer]

## 2016-08-29 NOTE — Discharge Summary (Signed)
Advanced Heart Failure Team  Discharge Summary   Patient ID: Adrian Lambert MRN: [id number], DOB/AGE: [birth year] 76 y.o. Admit date: 08/15/2016 D/C date:     08/29/2016   Primary Discharge Diagnoses:  1. A/C Systolic Heart Failure - EF on TEE this admission 20-25%, mechanical aortic valve ok.  2. Mechanical aortic valve 3. A fib RVR 4. AKI  Hospital Course:  76 y/o ?with the above complex PMH including severe AS s/p SJM mech AoV in 03/2003. He has been on chronic coumadin since. He also has a h/o HTN, obesity, and mild LV dysfxn EF 40-45% in 01/2016. Prior to admit he had a cough and dyspnea and was dx with bronchitis. Following outpt primary care treatment with abx and oral steroids, he actually got worse. He was seen 1/15 and was noted to be in rapid AFib with significant volume overload and ~ 6 lbs wt gain. He was placed on toprol and lasix and arrangements were made for echo and early f/u. Echo prior to admit with  LV dysfxn with EF of 25-30%. Admitted with increased dyspnea a leg edema.    1. Acute on chronic systolic CHF: EF on TEE this admission 20-25%, mechanical aortic valve ok.  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  No chest pain.  He was short of breath with minimal exertion.  PICC placed, co-ox 44% initially. Co-ox rose to 60% after starting milrinone 0.25. Once fully diuresed milrinone was weaned off. He was diuresed with IV lasix and transitioned to torsemide 60 mg daily. Overall diuresed 26 pounds. CO-OX remained stable off milrinone.  - Continue low dose Toprol XL, dig, and spiro.  Anticipate adding ARB in the community. BP soft while hospitalized so ARB was not started.  -  Given no ACS/chest pain, and with restoration of NSR, will repeat ECHO in a couple months.  If pure tachy-mediated CMP, would expect EF to improve.  If EF remains low, should have coronary angiography.  2. Mechanical aortic valve: Continue  warfarin and should be on ASA 81. Valve looked ok on TEE. 3. Atrial fibrillation: Admitted with new atrial fibrillation with RVR and CHF.  EF down to 20-25%.  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  Need to keep him in NSR.  DCCV on 1/26 but back to afib after.  Successful DCCV on 2/5, remains in NSR now. Plan to continue amio 200 mg twice a day x7 days then 200 mg daily. .  - Continue warfarin. INR 3.38 . Hold coumadin today then resume current home regimen.  Recheck INR on Friday with Dr Rex Kras PCP.   4. AKI: Creatinine 1.34. Stable.  He is being discharged in stable condition. Plan to follow up in the HF clinic later this month and check BMET/EKG at that time.   Discharge Weight : 212 pounds.  Discharge Vitals: Blood pressure 92/63, pulse 64, temperature 97.9 F (36.6 C), temperature source Oral, resp. rate 15, height 5\' 10"  (1.778 m), weight 212 lb 14.4 oz (96.6 kg), SpO2 97 %.  Labs: Lab Results  Component Value Date   WBC 13.2 (H) 08/29/2016   HGB 13.7 08/29/2016   HCT 41.9 08/29/2016   MCV 59.5 (L) 08/29/2016   PLT 163 08/29/2016     Recent Labs Lab 08/29/16 0437  NA 134*  K 3.5  CL 87*  CO2 36*  BUN 25*  CREATININE 1.34*  CALCIUM 8.9  GLUCOSE 89   No results found for:  CHOL, HDL, LDLCALC, TRIG BNP (last 3 results)  Recent Labs  08/07/16 1548 08/15/16 1147  BNP 328.4* 630.8*    ProBNP (last 3 results) No results for input(s): PROBNP in the last 8760 hours.   Diagnostic Studies/Procedures   No results found.  Discharge Medications   Allergies as of 08/29/2016      Reactions   Celecoxib Rash, Other (See Comments)   Celebrex      Medication List    STOP taking these medications   furosemide 40 MG tablet Commonly known as:  LASIX   metoprolol tartrate 25 MG tablet Commonly known as:  LOPRESSOR     TAKE these medications   allopurinol 300 MG tablet Commonly known as:  ZYLOPRIM Take 300 mg by mouth daily.   amiodarone 200 MG tablet Commonly known as:   PACERONE Take 1 tablet (200 mg total) by mouth 2 (two) times daily.   aspirin 81 MG chewable tablet Chew 1 tablet (81 mg total) by mouth daily.   colchicine 0.6 MG tablet Take 0.6 mg by mouth as needed (for gout).   digoxin 0.125 MG tablet Commonly known as:  LANOXIN Take 1 tablet (0.125 mg total) by mouth daily.   METAMUCIL PO Take 2 capsules by mouth 2 (two) times daily.   metoprolol succinate 25 MG 24 hr tablet Commonly known as:  TOPROL-XL Take 1 tablet (25 mg total) by mouth daily.   potassium chloride SA 20 MEQ tablet Commonly known as:  K-DUR,KLOR-CON Take 1 tablet (20 mEq total) by mouth daily.   spironolactone 25 MG tablet Commonly known as:  ALDACTONE Take 0.5 tablets (12.5 mg total) by mouth daily.   tamsulosin 0.4 MG Caps capsule Commonly known as:  FLOMAX Take 0.4 mg by mouth every other day.   tolterodine 4 MG 24 hr capsule Commonly known as:  DETROL LA Take 4 mg by mouth daily.   torsemide 20 MG tablet Commonly known as:  DEMADEX Take 3 tablets (60 mg total) by mouth daily.   warfarin 5 MG tablet Commonly known as:  COUMADIN Take 0.5-1 tablets (2.5-5 mg total) by mouth See admin instructions. Take 5 mg by mouth Monday, Wednesday, Friday and Sunday. Take 2.5 mg by mouth on all other days. Start taking on:  08/30/2016       Disposition   The patient will be discharged in stable condition to home. Discharge Instructions    (HEART FAILURE PATIENTS) Call MD:  Anytime you have any of the following symptoms: 1) 3 pound weight gain in 24 hours or 5 pounds in 1 week 2) shortness of breath, with or without a dry hacking cough 3) swelling in the hands, feet or stomach 4) if you have to sleep on extra pillows at night in order to breathe.    Complete by:  As directed    Diet - low sodium heart healthy    Complete by:  As directed    Heart Failure patients record your daily weight using the same scale at the same time of day    Complete by:  As directed     Increase activity slowly    Complete by:  As directed    PICC line removal    Complete by:  As directed      Follow-up Information    [name], MD Follow up on 09/12/2016.   Specialty:  Cardiology Why:  1:30  [id number]  Contact information: [address] [place] [address] [phone]  Gennette Pac, MD Follow up on 09/01/2016.   Specialty:  Family Medicine Why:  at 10:00 Contact information: [place] [address] [phone]             Duration of Discharge Encounter: Greater than 35 minutes   Signed, [place]  Np-C  08/29/2016, 9:13 AM

## 2016-08-29 NOTE — Progress Notes (Signed)
CARDIAC REHAB PHASE I   PRE:  Rate/Rhythm: 87  BP:  Sitting: 111/79        SaO2: 97 RA  MODE:  Ambulation: 550 ft   POST:  Rate/Rhythm: 79  BP:  Sitting: 118/87         SaO2: 95 RA  Pt ambulated 550 ft on RA, rolling walker, assist x1, steady gait, tolerated well, denies any complaints. Briefly reviewed education with pt and wife, able to perform teach back. Pt to recliner after walk, call bell within reach, awaiting discharge.   [id number] Lenna Sciara, RN, BSN 08/29/2016 12:09 PM

## 2016-08-29 NOTE — Progress Notes (Signed)
Physical Therapy Evaluation Patient Details Name: Adrian Lambert MRN: [id number] DOB: [birth year] Today's Date: 08/29/2016   History of Present Illness  Patient presented to the hospital with chronic cystolic and dystolic CHF. He hasd been in the hospital for 2 weeks. He is to be discharged today.   Clinical Impression  Patient presents with deconditioning after being in the hospital for 2 weeks. He ambulated 180' with fatigue. He would benefit from a few visits of home health to establish a home exercise plan. He has a treadmill he would like to use again but he was advised to start with a guided exercise program first to build strength. He has no need for further skilled acute therapy.     Follow Up Recommendations Home health PT    Equipment Recommendations  Rolling walker with 5" wheels    Recommendations for Other Services       Precautions / Restrictions Precautions Precautions: None Restrictions Weight Bearing Restrictions: No      Mobility  Bed Mobility Overal bed mobility: Independent             General bed mobility comments: was abkle to sit up without assistance   Transfers Overall transfer level: Needs assistance Equipment used: Rolling walker (2 wheeled) Transfers: Sit to/from Stand Sit to Stand: Supervision         General transfer comment: supervision for balacne upon initial standing   Ambulation/Gait Ambulation/Gait assistance: Supervision Ambulation Distance (Feet): 175 Feet Assistive device: Rolling walker (2 wheeled)       General Gait Details: slow but steady gait pattern. No syncope or dyspnea duirng ambualtion   Stairs            Wheelchair Mobility    Modified Rankin (Stroke Patients Only)       Balance Overall balance assessment: Needs assistance Sitting-balance support: No upper extremity supported Sitting balance-Leahy Scale: Good     Standing balance support: Bilateral upper extremity supported Standing  balance-Leahy Scale: Poor                               Pertinent Vitals/Pain Pain Assessment: No/denies pain    Home Living Family/patient expects to be discharged to:: Private residence Living Arrangements: Spouse/significant other Available Help at Discharge: Family Type of Home: Apartment Home Access: Level entry     Home Layout: One level   Additional Comments: stairs to go down into the basemet to his tredmill     Prior Function Level of Independence: Independent         Comments: Patient has a walker but he only used it occasionally      Hand Dominance   Dominant Hand: Right    Extremity/Trunk Assessment   Upper Extremity Assessment Upper Extremity Assessment: Overall WFL for tasks assessed    Lower Extremity Assessment Lower Extremity Assessment: Overall WFL for tasks assessed       Communication   Communication: No difficulties  Cognition   Behavior During Therapy: WFL for tasks assessed/performed Overall Cognitive Status: Within Functional Limits for tasks assessed                      General Comments      Exercises     Assessment/Plan    PT Assessment All further PT needs can be met in the next venue of care  PT Problem List Decreased strength;Decreased activity tolerance  PT Treatment Interventions      PT Goals (Current goals can be found in the Care Plan section)  Acute Rehab PT Goals Patient Stated Goal: to go home  PT Goal Formulation: With patient Time For Goal Achievement: 09/01/16 Potential to Achieve Goals: Good    Frequency     Barriers to discharge        Co-evaluation               End of Session Equipment Utilized During Treatment: Gait belt Activity Tolerance: Patient tolerated treatment well Patient left: in chair;with call bell/phone within reach;with family/visitor present Nurse Communication: Mobility status;Other (comment)         Time: 3754-3606 PT Time  Calculation (min) (ACUTE ONLY): 16 min   Charges:   PT Evaluation $PT Eval Low Complexity: 1 Procedure     PT G Codes:        Carney Living PT DPT  08/29/2016, 11:32 AM

## 2016-09-01 DIAGNOSIS — Z7901 Long term (current) use of anticoagulants: Secondary | ICD-10-CM | POA: Diagnosis not present

## 2016-09-01 DIAGNOSIS — Z952 Presence of prosthetic heart valve: Secondary | ICD-10-CM | POA: Diagnosis not present

## 2016-09-04 ENCOUNTER — Ambulatory Visit (HOSPITAL_COMMUNITY)
Admission: RE | Admit: 2016-09-04 | Discharge: 2016-09-04 | Disposition: A | Discharge status: Home / Self Care | Payer: Medicare Other | Source: Ambulatory Visit | Attending: Internal Medicine | Admitting: Internal Medicine

## 2016-09-04 ENCOUNTER — Telehealth (HOSPITAL_COMMUNITY): Payer: Self-pay

## 2016-09-04 ENCOUNTER — Telehealth: Payer: Self-pay | Admitting: Cardiology

## 2016-09-04 VITALS — BP 128/82 | HR 84 | Wt 202.0 lb

## 2016-09-04 DIAGNOSIS — Z7901 Long term (current) use of anticoagulants: Secondary | ICD-10-CM | POA: Diagnosis not present

## 2016-09-04 DIAGNOSIS — N179 Acute kidney failure, unspecified: Secondary | ICD-10-CM | POA: Insufficient documentation

## 2016-09-04 DIAGNOSIS — Z7982 Long term (current) use of aspirin: Secondary | ICD-10-CM | POA: Diagnosis not present

## 2016-09-04 DIAGNOSIS — I951 Orthostatic hypotension: Secondary | ICD-10-CM | POA: Insufficient documentation

## 2016-09-04 DIAGNOSIS — R55 Syncope and collapse: Secondary | ICD-10-CM

## 2016-09-04 DIAGNOSIS — I5022 Chronic systolic (congestive) heart failure: Secondary | ICD-10-CM | POA: Diagnosis not present

## 2016-09-04 DIAGNOSIS — I480 Paroxysmal atrial fibrillation: Secondary | ICD-10-CM | POA: Diagnosis not present

## 2016-09-04 DIAGNOSIS — Z952 Presence of prosthetic heart valve: Secondary | ICD-10-CM

## 2016-09-04 DIAGNOSIS — Z5189 Encounter for other specified aftercare: Secondary | ICD-10-CM | POA: Insufficient documentation

## 2016-09-04 DIAGNOSIS — Z79899 Other long term (current) drug therapy: Secondary | ICD-10-CM | POA: Insufficient documentation

## 2016-09-04 LAB — BRAIN NATRIURETIC PEPTIDE: B NATRIURETIC PEPTIDE 5: 325.9 pg/mL — AB (ref 0.0–100.0)

## 2016-09-04 LAB — CBC
HCT: 47 % (ref 39.0–52.0)
HEMOGLOBIN: 15.9 g/dL (ref 13.0–17.0)
MCH: 19.9 pg — AB (ref 26.0–34.0)
MCHC: 33.8 g/dL (ref 30.0–36.0)
MCV: 58.9 fL — ABNORMAL LOW (ref 78.0–100.0)
PLATELETS: 178 10*3/uL (ref 150–400)
RBC: 7.98 MIL/uL — AB (ref 4.22–5.81)
RDW: 17.6 % — ABNORMAL HIGH (ref 11.5–15.5)
WBC: 13.8 10*3/uL — AB (ref 4.0–10.5)

## 2016-09-04 LAB — BASIC METABOLIC PANEL
ANION GAP: 12 (ref 5–15)
BUN: 33 mg/dL — ABNORMAL HIGH (ref 6–20)
CHLORIDE: 93 mmol/L — AB (ref 101–111)
CO2: 30 mmol/L (ref 22–32)
Calcium: 9.7 mg/dL (ref 8.9–10.3)
Creatinine, Ser: 1.77 mg/dL — ABNORMAL HIGH (ref 0.61–1.24)
GFR calc Af Amer: 42 mL/min — ABNORMAL LOW (ref >60)
GFR, EST NON AFRICAN AMERICAN: 36 mL/min — AB (ref >60)
Glucose, Bld: 126 mg/dL — ABNORMAL HIGH (ref 65–99)
POTASSIUM: 5 mmol/L (ref 3.5–5.1)
SODIUM: 135 mmol/L (ref 135–145)

## 2016-09-04 LAB — DIGOXIN LEVEL: DIGOXIN LVL: 0.7 ng/mL — AB (ref 0.8–2.0)

## 2016-09-04 MED ORDER — AMIODARONE HCL 200 MG PO TABS: 200.0000 mg | ORAL_TABLET | Freq: Every day | ORAL | 6 refills | Status: DC

## 2016-09-04 MED ORDER — TORSEMIDE 20 MG PO TABS: 40.0000 mg | ORAL_TABLET | Freq: Every day | ORAL | 6 refills | Status: DC

## 2016-09-04 NOTE — Telephone Encounter (Signed)
Patient called CHF clinic to report syncopal episode last night x 1 event after standing up from chair to go to the bathroom.  States "I hit the floor and the next thing I remember I was looking up at the ceiling". Patient reports weight today 197 lbs (212 lbs 6 days ago at DC). Denies any s/s of CP, dizziness, SOB, and states he otherwise "feels fine". [facility] advised to hold am medications and have wife bring him to CHF clinic for evaluationa nd treatment this morning as soon as they are able. Patient aware and agreeable. Aware and agreeable to plan as stated above.  Renee Pain

## 2016-09-04 NOTE — Telephone Encounter (Signed)
New message       Pt was discharged from the hosp last Tuesday.  Talk to the nurse. Pt would not tell me any more

## 2016-09-04 NOTE — Telephone Encounter (Signed)
Returned call to patient.He stated he is presently on his way to [place].Stated he already spoke to someone about syncopal episode last night was told to come to [facility] now.

## 2016-09-04 NOTE — Patient Instructions (Signed)
STOP Potassium.  HOLD FOR TWO DAYS:  Torsemide and Spironolactone.  On Wednesday 09/06/2016:  Restart Torsemide at a REDUCED dose on 40 mg (2 tabs) once daily. Restart Spironolactone at same dose of 12.5 mg (1/2 tablet) once daily.  NO driving until your next appointment with Dr. Aundra Dubin.  Follow up 2 weeks with Dr. Aundra Dubin.  Do the following things EVERYDAY: 1) Weigh yourself in the morning before breakfast. Write it down and keep it in a log. 2) Take your medicines as prescribed 3) Eat low salt foods-Limit salt (sodium) to 2000 mg per day.  4) Stay as active as you can everyday 5) Limit all fluids for the day to less than 2 liters

## 2016-09-04 NOTE — Progress Notes (Signed)
PCP: Dr Rex Kras - Also manages INR  Primary Cardiologist: Dr Aundra Dubin    HPI: Adrian Lambert is a 76 year old male with a history of severe AS s/p SJM mech AoV in 03/2003, on chronic coumadin, HTN, obesity, and mild LV dysfxn EF 40-45% in 01/2016. Prior to admit in January he had a cough and dyspnea that was thought to be bronchitis so he was treated with abx and oral steroids. He was seen 08/07/16 by PCP and was noted to be in rapid AFib with significant volume overload.   Admitted 1/23 through 08/29/16 with marked volume overload, lower extremity edema, and A fib RVR. Placed on short term milrinone for low output and diuresed with IV lasix. Hospital course complicated by A fib. Once fully diuresed he had successful DC-CV on 2/5. Discharged on amio 200 mg twice a day. Placed torsemide  60 mg daily. Discharge weight 212 pounds.   Today he contacted to HF clinic after he had an unwitnessed syncopal episode. Says he stood up beside his bed to use the urinal and passed out. No associated symptoms. He was instructed to follow up in the HF clinic. Overall feeling ok. Denies SOB/PND/Orthopnea. Weight at home has been trending down since discharge 209>197 pounds. Following low salt diet. Not drinking much fluid. Appetite ok. Taking all medications.  Lives with his wife. Does not drink alcohol or smoke.    Labs  09/04/2016  Hgb 15.9 K 5.0 Creatinine 1.77   1. 08/18/2016 TEE 20-25%.  2. 08/11/2016: EF 20-25% ECHO 3. 08/28/2016 Successful DC-CV --> NSR 4 09/04/2016 Sycope--> over diuresis 5. AS S/P SJM mech AoV 03/2013 6. HTN 7. Obesity  ROS: All systems negative except as listed in HPI, PMH and Problem List.  SH:  Social History   Social History  . Marital status: Married    Spouse name: N/A  . Number of children: 4  . 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 Smoking status: Never Smoker  . Smokeless tobacco: Never Used  . Alcohol  use 1.8 oz/week    3 Cans of beer per week     Comment:  "may have 2-3 beers on the weekend"  . Drug use: No  . Sexual activity: Not on file   Other Topics Concern  . Not on file   Social History Narrative  . No narrative on file    FH:  Family History  Problem Relation Age of Onset  . Heart failure Mother   . Diabetes Mother   . Prostate cancer Father     Past Medical History:  Diagnosis Date  . Anticoagulant long-term use   . Arthritis    "probably in my thumbs" (06/26/2012)  . Bifascicular block   . Chronic combined systolic and diastolic CHF (congestive heart failure) ([facility])    a. 01/2016 Echo: EF 45-50%, gr2 DD, inflat AK (poor acoustic windows);  b. 07/2016 Echo: EF 25-30%, mild LVH, PASP 70mmHg (pt in Afib).  . Complication of anesthesia    "wake up w/a start; hallucinations" (06/26/2012)  . Critical Aortic Stenosis    a. 03/2003 s/p SJM #25 mech prosthetic AoV;  b. 07/2016 Echo: EF 25-30% (in setting of AF), AoV area 1.72 cm^2 (VTI), 1.75 cm^2 (Vmean).  . Diverticulosis   . Gouty arthritis    "have had it in both feet, ankles, right knee" (06/26/2012)  . History of diverticulitis of  colon   . History of kidney stones   . History of pancreatitis   . Hypertension   . Incomplete RBBB   . PAF (paroxysmal atrial fibrillation) ([facility])    a. CHA2DS2VASc = 4-->chronic coumadin in setting of mech AoV;  b. 07/2016 Recurrent AF RVR.    Current Outpatient Prescriptions  Medication Sig Dispense Refill  . allopurinol (ZYLOPRIM) 300 MG tablet Take 300 mg by mouth daily.     Marland Kitchen amiodarone (PACERONE) 200 MG tablet 200 mg twice a day x 1 week then 200 mg daily 60 tablet 6  . aspirin 81 MG chewable tablet Chew 1 tablet (81 mg total) by mouth daily. 30 tablet 6  . colchicine 0.6 MG tablet Take 0.6 mg by mouth as needed (for gout).     Marland Kitchen digoxin (LANOXIN) 0.125 MG tablet Take 1 tablet (0.125 mg total) by mouth daily. 30 tablet 6  . metoprolol succinate (TOPROL-XL) 25 MG 24 hr tablet Take 1  tablet (25 mg total) by mouth daily. 30 tablet 6  . potassium chloride SA (K-DUR,KLOR-CON) 20 MEQ tablet Take 1 tablet (20 mEq total) by mouth daily. 30 tablet 6  . Psyllium (METAMUCIL PO) Take 2 capsules by mouth 2 (two) times daily.     Marland Kitchen spironolactone (ALDACTONE) 25 MG tablet Take 0.5 tablets (12.5 mg total) by mouth daily. 30 tablet 6  . tamsulosin (FLOMAX) 0.4 MG CAPS capsule Take 0.4 mg by mouth every other day.     . tolterodine (DETROL LA) 4 MG 24 hr capsule Take 4 mg by mouth daily.   0  . torsemide (DEMADEX) 20 MG tablet Take 3 tablets (60 mg total) by mouth daily. 90 tablet 6  . warfarin (COUMADIN) 5 MG tablet Take 0.5-1 tablets (2.5-5 mg total) by mouth See admin instructions. Take 5 mg by mouth Monday, Wednesday, Friday and Sunday. Take 2.5 mg by mouth on all other days. 45 tablet 6   No current facility-administered medications for this encounter.     Vitals:   09/04/16 1102  BP: 128/82  Pulse: 84  SpO2: 94%  Weight: 202 lb (91.6 kg)   Filed Weights   09/04/16 1102  Weight: 202 lb (91.6 kg)   PHYSICAL EXAM: No other changes on PE unless listed 09/04/2016  Sitting 128/82 Standing 112/80  General:  Well appearing. No resp difficulty. Ambulated in the clinic  HEENT: normal Neck: supple. JVP flat. Carotids 2+ bilaterally; no bruits. No lymphadenopathy or thryomegaly appreciated. Cor: PMI normal. Regular. S1/S2, mechanical S2, no S3/S4. Lungs: clear Abdomen: soft, nontender, nondistended. No hepatosplenomegaly. No bruits or masses. Good bowel sounds. Extremities: no cyanosis, clubbing, rash, no lower extremtiy edema.  Neuro: alert & orientedx3, cranial nerves grossly intact. Moves all 4 extremities w/o difficulty. Affect pleasant.   ECG: Sinus Rhythm 80 bpm PR 226 ms    ASSESSMENT & PLAN: 1. Chronic Systolic HF: EF on TEE this admission 20-25%, mechanical aortic valve ok. Fall in EF may be related to tachycardia-mediated CMP but he has not had a coronary disease  workup. No chest pain. NYHA II- functionally doing ok despite syncopal episode.   Volume status low. Orthostatic BP dropped as above. Had syncopal episode earlier today likely due to over diuresis.   Hold torsemide and spiro for 2 days then cut back torsemide to 40 mg daily and restart 12.5 mg spiro daily.  Stop potassium.  Continue low dose Toprol XL and dig. Check dig level --> 0.7 today. BMET today with elevated creatinine.  Plan to repeat ECHO in 3 months after HF meds optimized.    2. Mechanical aortic valve: Continue warfarin and should be on ASA 81. Valve looked ok on TEE 07/2016  3. Atrial fibrillation: S/P DC-CV 08/28/16 Remains in NSR. INR managed by PCP.  Today he will cut back amio to 200 mg daily. He understands he will need yearly eyes exams. TSH LFTs ok in 07/2016. Check TSH/LFTs in a few months.   4. AKI: Creatinine  trending up from 1.34>1.77 As above over diuresed. Repeat in BMEt in 2 weeks. May need to lower torsemide dose.   5. Syncope- Orthostatic Hypotension. I suspect this is from over diuresis as above. Holding diuretics for few days. I have asked him to not drive until he is evaluated by Dr Aundra Dubin in a couple of weeks.    Follow up in 2 weeks with Dr Aundra Dubin. Check BMET today.   Fran Mcree NP-C  1:43 PM

## 2016-09-07 ENCOUNTER — Telehealth (HOSPITAL_COMMUNITY): Payer: Self-pay | Admitting: *Deleted

## 2016-09-07 DIAGNOSIS — Z952 Presence of prosthetic heart valve: Secondary | ICD-10-CM | POA: Diagnosis not present

## 2016-09-07 DIAGNOSIS — Z7901 Long term (current) use of anticoagulants: Secondary | ICD-10-CM | POA: Diagnosis not present

## 2016-09-07 NOTE — Telephone Encounter (Signed)
Patient called in reporting that he was standing using the bathroom this morning and fell straight down. He was seen here in clinic on Monday for a syncopal episode and was advised to hold all diuretics for a couple of days.  Patient restarted them yesterday but had not taking his meds yet this morning.    I spoke with Dr. Aundra Dubin and he advises patient to start wearing his compression hose and to hold diuretics for today again.    Patient understands and is agreeable with plan.  I told him to call back if it occurs again.  No further questions at this time.

## 2016-09-08 DIAGNOSIS — I5043 Acute on chronic combined systolic (congestive) and diastolic (congestive) heart failure: Secondary | ICD-10-CM | POA: Diagnosis not present

## 2016-09-12 ENCOUNTER — Inpatient Hospital Stay (HOSPITAL_COMMUNITY): Payer: Medicare Other

## 2016-09-12 ENCOUNTER — Telehealth (HOSPITAL_COMMUNITY): Payer: Self-pay

## 2016-09-12 NOTE — Telephone Encounter (Signed)
Have him come back in this week, work in with me later in week.  May need event monitor.

## 2016-09-12 NOTE — Telephone Encounter (Signed)
Called to check in on patient has had 2 reported syncopal episodes. States he is only taking 1 20 mg tablet of torsemide as "it's all I can stand to take, taking 2 makes me pass out". States he is still dizzy and disoriented and has no energy. Will forward to Dr. Aundra Dubin to see what other recommendations he may have.  Patient due to follow up in clinic in 2 weeks. Advised is s/s worsen to cal CHF clinic triage or report to ED.  Renee Pain, RN

## 2016-09-14 DIAGNOSIS — Z952 Presence of prosthetic heart valve: Secondary | ICD-10-CM | POA: Diagnosis not present

## 2016-09-14 DIAGNOSIS — Z7901 Long term (current) use of anticoagulants: Secondary | ICD-10-CM | POA: Diagnosis not present

## 2016-09-15 ENCOUNTER — Other Ambulatory Visit (HOSPITAL_COMMUNITY): Payer: Self-pay | Admitting: Cardiology

## 2016-09-15 NOTE — Telephone Encounter (Signed)
appt sch °

## 2016-09-18 ENCOUNTER — Encounter (HOSPITAL_COMMUNITY): Payer: Self-pay

## 2016-09-18 ENCOUNTER — Encounter (HOSPITAL_COMMUNITY): Payer: Self-pay | Admitting: *Deleted

## 2016-09-18 ENCOUNTER — Ambulatory Visit (HOSPITAL_COMMUNITY)
Admission: RE | Admit: 2016-09-18 | Discharge: 2016-09-18 | Disposition: A | Discharge status: Home / Self Care | Payer: Medicare Other | Source: Ambulatory Visit | Attending: Cardiology | Admitting: Cardiology

## 2016-09-18 VITALS — BP 115/70 | HR 80 | Wt 201.2 lb

## 2016-09-18 DIAGNOSIS — I4819 Other persistent atrial fibrillation: Secondary | ICD-10-CM

## 2016-09-18 DIAGNOSIS — Z8546 Personal history of malignant neoplasm of prostate: Secondary | ICD-10-CM | POA: Diagnosis not present

## 2016-09-18 DIAGNOSIS — I4891 Unspecified atrial fibrillation: Secondary | ICD-10-CM | POA: Diagnosis not present

## 2016-09-18 DIAGNOSIS — Z952 Presence of prosthetic heart valve: Secondary | ICD-10-CM | POA: Diagnosis not present

## 2016-09-18 DIAGNOSIS — I481 Persistent atrial fibrillation: Secondary | ICD-10-CM | POA: Diagnosis not present

## 2016-09-18 DIAGNOSIS — Z7901 Long term (current) use of anticoagulants: Secondary | ICD-10-CM | POA: Insufficient documentation

## 2016-09-18 DIAGNOSIS — I130 Hypertensive heart and chronic kidney disease with heart failure and stage 1 through stage 4 chronic kidney disease, or unspecified chronic kidney disease: Secondary | ICD-10-CM | POA: Diagnosis not present

## 2016-09-18 DIAGNOSIS — Z79899 Other long term (current) drug therapy: Secondary | ICD-10-CM | POA: Diagnosis not present

## 2016-09-18 DIAGNOSIS — R55 Syncope and collapse: Secondary | ICD-10-CM

## 2016-09-18 DIAGNOSIS — I712 Thoracic aortic aneurysm, without rupture: Secondary | ICD-10-CM | POA: Insufficient documentation

## 2016-09-18 DIAGNOSIS — I5022 Chronic systolic (congestive) heart failure: Secondary | ICD-10-CM | POA: Diagnosis not present

## 2016-09-18 LAB — BASIC METABOLIC PANEL
Anion gap: 12 (ref 5–15)
BUN: 28 mg/dL — AB (ref 6–20)
CO2: 28 mmol/L (ref 22–32)
Calcium: 9.7 mg/dL (ref 8.9–10.3)
Chloride: 93 mmol/L — ABNORMAL LOW (ref 101–111)
Creatinine, Ser: 1.54 mg/dL — ABNORMAL HIGH (ref 0.61–1.24)
GFR calc Af Amer: 49 mL/min — ABNORMAL LOW (ref >60)
GFR calc non Af Amer: 42 mL/min — ABNORMAL LOW (ref >60)
GLUCOSE: 173 mg/dL — AB (ref 65–99)
POTASSIUM: 4.5 mmol/L (ref 3.5–5.1)
Sodium: 133 mmol/L — ABNORMAL LOW (ref 135–145)

## 2016-09-18 LAB — PROTIME-INR
INR: 2.14
Prothrombin Time: 24.2 seconds — ABNORMAL HIGH (ref 11.4–15.2)

## 2016-09-18 LAB — DIGOXIN LEVEL: Digoxin Level: 1.2 ng/mL (ref 0.8–2.0)

## 2016-09-18 MED ORDER — AMIODARONE HCL 200 MG PO TABS: 200.0000 mg | ORAL_TABLET | Freq: Two times a day (BID) | ORAL | 6 refills | Status: DC

## 2016-09-18 NOTE — Progress Notes (Signed)
PCP: Dr Rex Kras - Also manages INR Cardiology: Dr. Martinique  HF Cardiology: Dr Aundra Dubin    HPI: [name] is a 76 year old male with a history of severe AS s/p St Jude mechanical AVR in 03/2003 on chronic coumadin, HTN, obesity, and mild LV dysfxn EF 40-45% on 01/2016 echo. Prior to admit in 1/18, he had a cough and dyspnea that was thought to be bronchitis so he was treated with abx and oral steroids. He was seen in cardiology office in 1/18 and was noted to be in rapid atrial fibrillation with significant volume overload.   Admitted 1/23 through 08/29/16 with marked volume overload, lower extremity edema, and atrial fibrillation with RVR.  Initial cardioversion failed and he was difficult to diurese. He was placed on milrinone for low output and diuresed with IV lasix with good response.  Weight came down considerably. Amiodarone was begun. Once fully diuresed, he had successful DC-CV on 08/28/16. Discharged on torsemide 60 mg daily. Discharge weight 212 pounds.   He continued to lose weight as an outpatient.  He called [place] a couple of weeks ago after a syncopal episode.  He stood up from sitting and got lightheaded with transient loss of consciousness.  He had been having orthostatic symptoms.  We saw him in the office and cut back on torsemide from 60 mg daily down eventually to 20 mg daily.  Creatinine up some to 1.7.    Today, he is feeling better.  Still no peripheral edema.  No palpitations but he is noted to be back in atrial fibrillation today.  Rate is controlled.  No dyspnea walking on flat ground but does get short of breath with steps.   No chest pain.  Rare orthostatic symptoms now, no further syncope/presyncope.   No BRBPR/melena.  He is wearing compression stockings.  No orthopnea/PND.   Labs (09/04/2016):  Hgb 15.9, K 5.0, Creatinine 1.77, digoxin 0.7, BNP 326   ECG: atrial fibrillation, RBBB, LAFB, inferior Qs, anterior Qs (reviewed personally)  PMH: 1. Aortic stenosis: # 25 St Jude  mechanical aortic valve in 9/14.   2. Chronic systolic CHF: Echo in [id number] with EF 45-50%.  Admitted in 1/18 with afib with RVR, ?tachycardia-mediated cardiomyopathy.  - Echo (1/18) with EF 25-30%, stable mechanical aortic valve.  - TEE (1/18) with EF 20-25%, stable mechanical aortic valve, moderate MR, small PFO.  - Admission 1/18 with acute on chronic systolic CHF, required milrinone, extensively diuresed.  3. Atrial fibrillation: First noted in [id number], uncertain how long it had been present (with RVR).  08/28/2016 Successful DC-CV --> NSR.  Back in atrial fibrillation at 09/18/16 office visit.  4. 09/04/2016 Syncope--> over-diuresis most likely.  5. HTN 6. Ascending aortic aneurysm: 4.4 cm on 7/17 echo.  7. Prostate cancer: Treated with radioactive seed implantation.  8. Nephrolithiasis.   ROS: All systems negative except as listed in HPI, PMH and Problem List.  SH:  Social History   Social History  . Marital status: Married    Spouse name: N/A  . Number of children: 4  . 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 Smoking status: Never Smoker  . Smokeless tobacco: Never Used  . Alcohol use 1.8 oz/week    3 Cans of beer per week     Comment:  "may have 2-3 beers on the weekend"  . Drug use: No  . Sexual activity: Not on  file   Other Topics Concern  . Not on file   Social History Narrative  . No narrative on file    FH:  Family History  Problem Relation Age of Onset  . Heart failure Mother   . Diabetes Mother   . Prostate cancer Father     Current Outpatient Prescriptions  Medication Sig Dispense Refill  . allopurinol (ZYLOPRIM) 300 MG tablet Take 300 mg by mouth daily.     Marland Kitchen amiodarone (PACERONE) 200 MG tablet Take 1 tablet (200 mg total) by mouth daily. 30 tablet 6  . aspirin 81 MG chewable tablet Chew 1 tablet (81 mg total) by mouth daily. 30 tablet 6  . colchicine 0.6 MG tablet Take 0.6 mg by mouth as  needed (for gout).     Marland Kitchen digoxin (LANOXIN) 0.125 MG tablet Take 1 tablet (0.125 mg total) by mouth daily. 30 tablet 6  . metoprolol succinate (TOPROL-XL) 25 MG 24 hr tablet Take 1 tablet (25 mg total) by mouth daily. 30 tablet 6  . Psyllium (METAMUCIL PO) Take 2 capsules by mouth 2 (two) times daily.     Marland Kitchen spironolactone (ALDACTONE) 25 MG tablet Take 0.5 tablets (12.5 mg total) by mouth daily. 30 tablet 6  . tamsulosin (FLOMAX) 0.4 MG CAPS capsule Take 0.4 mg by mouth every other day.     . tolterodine (DETROL LA) 4 MG 24 hr capsule Take 4 mg by mouth daily.   0  . torsemide (DEMADEX) 20 MG tablet Take 20 mg by mouth daily.    Marland Kitchen warfarin (COUMADIN) 5 MG tablet Take 0.5-1 tablets (2.5-5 mg total) by mouth See admin instructions. Take 5 mg by mouth Monday, Wednesday, Friday and Sunday. Take 2.5 mg by mouth on all other days. 45 tablet 6   No current facility-administered medications for this encounter.     Vitals:   09/18/16 1044  BP: 115/70  Pulse: 80  SpO2: 98%  Weight: 201 lb 4 oz (91.3 kg)   Filed Weights   09/18/16 1044  Weight: 201 lb 4 oz (91.3 kg)   PHYSICAL EXAM: General:  Well appearing. No resp difficulty. Ambulated into the clinic  HEENT: normal Neck: supple. JVP flat. Carotids 2+ bilaterally; no bruits. No lymphadenopathy or thryomegaly appreciated. Cor: PMI normal. Regular. S1/S2, mechanical S2, no S3/S4. 1/6 SEM RUSB.  Lungs: clear Abdomen: soft, nontender, nondistended. No hepatosplenomegaly. No bruits or masses. Good bowel sounds. Extremities: no cyanosis, clubbing, rash, no lower extremity edema.  Neuro: alert & orientedx3, cranial nerves grossly intact. Moves all 4 extremities w/o difficulty. Affect pleasant   ASSESSMENT & PLAN: 1. Chronic systolic CHF: EF on TEE in 1/18 20-25%, mechanical aortic valve ok.  Fall in EF may be related to tachycardia-mediated CMP but he has not had a coronary disease workup (echo 7/17 with EF 45-50%).  No chest pain.  He required  milrinone to assess diuresis during 1/18-2/18 admission.  Currently, NYHA class II symptoms and does not appear volume overloaded.  Unfortunately, he is back in atrial fibrillation. - Continue current dose to torsemide, 20 mg daily.  Needs BMET today.  - Continue digoxin 0.125 daily, check level today.   - Continue spironolactone 12.5 daily, K was 5 when last checked so will not increase today.   - Can continue low dose Toprol XL.  - Would like to add losartan 12.5 mg qhs.  Will get BMET today, and if K/creatinine have trended down, will likely start him on losartan.  - Given  no ACS/chest pain, will repeat echo in a couple months.  If pure tachycardia-mediated cardiomyopathy, would expect EF to improve to baseline.  If EF remains low, should have coronary angiography.  2. Mechanical aortic valve: Continue warfarin and ASA 81. Valve looked ok on 1/18 TEE. 3. Atrial fibrillation: Admitted 1/18 with atrial fibrillation with RVR and CHF.  EF down to 20-25%.  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  Need to keep him in NSR if possible.  DCCV during 2/18 admission but back in atrial fibrillation today.  Rate is controlled.  He has loaded more amiodarone so hopefully will have better chance of maintaining NSR if we repeat cardioversion. - He has had therapeutic INR since TEE.  Last INRs since discharge from hospital have been 3.38 on 2/6, 3.5 on 2/15, and 2.6 last week.  Will get INR today.  If today's INR is ok, will arrange for DCCV (without TEE) later this week (get point of care INR day of DCCV).  We discussed risks/benefits of procedure and he agrees to have it done.  - Increase amiodarone to 200 mg bid until DCCV, then back to 200 daily.  Will need to follow LFTs/TSH.  Will need regular eye exam.  Will need baseline PFTs.  - Long-term, would consider atrial fibrillation ablation.  Will have him see Dr. Rayann Heman or Dr. Curt Bears eventually.    4. CKD: Stage III.  Creatinine up recently with over-diuresis.  Recheck today.  5. Syncope: Suspect episode of syncope prior to last visit was due to over-diuresis (orthostatic).  6. Refer for cardiac rehab.   Followup in 3 wks.   [name] 09/18/2016 11:46 AM

## 2016-09-18 NOTE — Patient Instructions (Addendum)
Increase Amiodarone to 200 mg Twice daily FOR 1 WEEK, THEN back to 200 mg daily Labs today  Your physician has recommended that you have a Cardioversion (DCCV). Electrical Cardioversion uses a jolt of electricity to your heart either through paddles or wired patches attached to your chest. This is a controlled, usually prescheduled, procedure. Defibrillation is done under light anesthesia in the hospital, and you usually go home the day of the procedure. This is done to get your heart back into a normal rhythm. You are not awake for the procedure. Please see the instruction sheet given to you today.  You have been referred to Cardiac Rehab  Your physician recommends that you schedule a follow-up appointment in: 3 weeks

## 2016-09-20 ENCOUNTER — Telehealth (HOSPITAL_COMMUNITY): Payer: Self-pay | Admitting: *Deleted

## 2016-09-20 ENCOUNTER — Other Ambulatory Visit (HOSPITAL_COMMUNITY): Payer: Self-pay | Admitting: *Deleted

## 2016-09-20 DIAGNOSIS — I5042 Chronic combined systolic (congestive) and diastolic (congestive) heart failure: Secondary | ICD-10-CM

## 2016-09-20 MED ORDER — LOSARTAN POTASSIUM 25 MG PO TABS: 12.5000 mg | ORAL_TABLET | Freq: Every day | ORAL | 3 refills | Status: DC

## 2016-09-20 NOTE — Telephone Encounter (Signed)
Basic metabolic panel  Order: [id number]  Status:  Final result Visible to patient:  Yes (MyChart) Dx:  Atrial fibrillation, unspecified type...  Notes Recorded by Kennieth Rad, RN on 09/20/2016 at 3:55 PM EST Spoke with patient and he is agreeable with plan. Prescription sent to pharmacy, lab orders placed, and appointment scheduled. ------  Notes Recorded by Harvie Junior, CMA on 09/19/2016 at 4:19 PM EST Attempted to reach pt on preferred number listed in chart. No answer left VM for pt to callback for lab results and med changes. ------  Notes Recorded by Larey Dresser, MD on 09/18/2016 at 1:57 PM EST Have patient start losartan 12.5 mg qhs. He needs BMET again in 1 week, when he gets the BMET, have him also get a digoxin trough (level done prior to taking his daily dose).

## 2016-09-21 ENCOUNTER — Telehealth (HOSPITAL_COMMUNITY): Payer: Self-pay | Admitting: *Deleted

## 2016-09-21 NOTE — Telephone Encounter (Signed)
Patient called back this morning regarding Losartan that was started yesterday.  He is aware that it is prescribed for BP and he was confused why he was starting it because he doesn't have blood pressure issues.  I called him back but had to leave message.  I explained that it can be given for several reasons but he was prescribed to help improve his heart failure.  Told him to call us back if he still had more questions.

## 2016-09-22 ENCOUNTER — Ambulatory Visit (HOSPITAL_COMMUNITY): Payer: Medicare Other | Admitting: Anesthesiology

## 2016-09-22 ENCOUNTER — Ambulatory Visit (HOSPITAL_COMMUNITY)
Admission: RE | Admit: 2016-09-22 | Discharge: 2016-09-22 | Disposition: A | Discharge status: Home / Self Care | Payer: Medicare Other | Source: Ambulatory Visit | Attending: Cardiology | Admitting: Cardiology

## 2016-09-22 ENCOUNTER — Encounter (HOSPITAL_COMMUNITY): Payer: Medicare Other

## 2016-09-22 ENCOUNTER — Encounter (HOSPITAL_COMMUNITY)
Admission: RE | Disposition: A | Discharge status: Home / Self Care | Payer: Self-pay | Source: Ambulatory Visit | Attending: Cardiology

## 2016-09-22 DIAGNOSIS — I4891 Unspecified atrial fibrillation: Secondary | ICD-10-CM | POA: Diagnosis not present

## 2016-09-22 DIAGNOSIS — Z538 Procedure and treatment not carried out for other reasons: Secondary | ICD-10-CM | POA: Diagnosis not present

## 2016-09-22 DIAGNOSIS — I110 Hypertensive heart disease with heart failure: Secondary | ICD-10-CM | POA: Insufficient documentation

## 2016-09-22 DIAGNOSIS — I509 Heart failure, unspecified: Secondary | ICD-10-CM | POA: Insufficient documentation

## 2016-09-22 DIAGNOSIS — M1990 Unspecified osteoarthritis, unspecified site: Secondary | ICD-10-CM | POA: Diagnosis not present

## 2016-09-22 DIAGNOSIS — Z7901 Long term (current) use of anticoagulants: Secondary | ICD-10-CM | POA: Diagnosis not present

## 2016-09-22 DIAGNOSIS — K219 Gastro-esophageal reflux disease without esophagitis: Secondary | ICD-10-CM | POA: Diagnosis not present

## 2016-09-22 LAB — PROTIME-INR: INR: 2.5 — AB (ref 0.9–1.1)

## 2016-09-22 SURGERY — CANCELLED PROCEDURE
Anesthesia: General

## 2016-09-22 NOTE — Anesthesia Preprocedure Evaluation (Deleted)
Anesthesia Evaluation  Patient identified by MRN, date of birth, ID band Patient awake    Reviewed: Allergy & Precautions, H&P , NPO status , Patient's Chart, lab work & pertinent test results, reviewed documented beta blocker date and time   Airway Mallampati: II  TM Distance: >3 FB Neck ROM: Full    Dental no notable dental hx. (+) Teeth Intact, Dental Advisory Given   Pulmonary neg pulmonary ROS,    Pulmonary exam normal breath sounds clear to auscultation       Cardiovascular hypertension, Pt. on medications and Pt. on home beta blockers +CHF  + dysrhythmias Atrial Fibrillation  Rhythm:Regular Rate:Normal  S/p AVR   Neuro/Psych negative neurological ROS  negative psych ROS   GI/Hepatic Neg liver ROS, GERD  Controlled,  Endo/Other  negative endocrine ROS  Renal/GU negative Renal ROS  negative genitourinary   Musculoskeletal  (+) Arthritis , Osteoarthritis,    Abdominal   Peds  Hematology negative hematology ROS (+)   Anesthesia Other Findings   Reproductive/Obstetrics negative OB ROS                            Anesthesia Physical Anesthesia Plan  ASA: III  Anesthesia Plan: General   Post-op Pain Management:    Induction: Intravenous  Airway Management Planned: Mask  Additional Equipment:   Intra-op Plan:   Post-operative Plan:   Informed Consent: I have reviewed the patients History and Physical, chart, labs and discussed the procedure including the risks, benefits and alternatives for the proposed anesthesia with the patient or authorized representative who has indicated his/her understanding and acceptance.   Dental advisory given  Plan Discussed with: CRNA  Anesthesia Plan Comments:         Anesthesia Quick Evaluation

## 2016-09-22 NOTE — Progress Notes (Signed)
Patient arrived to endoscopy for outpatient cardioversion today. Sinus rhythm on monitor, 12 lead EKG confirms same. Call placed to Dr. Aundra Dubin, he reviewed EKG and gave order to cancel procedure for patient self converted. Patient and wife advised of this, they express understanding and agree.

## 2016-09-27 ENCOUNTER — Ambulatory Visit (HOSPITAL_COMMUNITY)
Admission: RE | Admit: 2016-09-27 | Discharge: 2016-09-27 | Disposition: A | Discharge status: Home / Self Care | Payer: Medicare Other | Source: Ambulatory Visit | Attending: Cardiology | Admitting: Cardiology

## 2016-09-27 DIAGNOSIS — I5042 Chronic combined systolic (congestive) and diastolic (congestive) heart failure: Secondary | ICD-10-CM | POA: Diagnosis present

## 2016-09-27 LAB — BASIC METABOLIC PANEL
Anion gap: 10 (ref 5–15)
BUN: 26 mg/dL — AB (ref 6–20)
CHLORIDE: 95 mmol/L — AB (ref 101–111)
CO2: 30 mmol/L (ref 22–32)
CREATININE: 1.44 mg/dL — AB (ref 0.61–1.24)
Calcium: 9.7 mg/dL (ref 8.9–10.3)
GFR calc Af Amer: 53 mL/min — ABNORMAL LOW (ref >60)
GFR calc non Af Amer: 46 mL/min — ABNORMAL LOW (ref >60)
Glucose, Bld: 126 mg/dL — ABNORMAL HIGH (ref 65–99)
Potassium: 4.8 mmol/L (ref 3.5–5.1)
SODIUM: 135 mmol/L (ref 135–145)

## 2016-09-27 LAB — DIGOXIN LEVEL: Digoxin Level: 0.8 ng/mL (ref 0.8–2.0)

## 2016-09-29 ENCOUNTER — Telehealth (HOSPITAL_COMMUNITY): Payer: Self-pay | Admitting: Family Medicine

## 2016-09-29 NOTE — Telephone Encounter (Signed)
Patient has [employer], insurance benefits verified. BCBS Medicare: $0 co-payment, no deductible, out of pocket $6700/$2013.32 has been met, 0% co-insurance, no pre-authorization, and limit 36 visits. Passport/reference [phone]. Information given to [facility] for review.

## 2016-10-06 ENCOUNTER — Encounter (HOSPITAL_COMMUNITY): Payer: Self-pay | Admitting: Pharmacist

## 2016-10-17 ENCOUNTER — Telehealth (HOSPITAL_COMMUNITY): Payer: Self-pay | Admitting: Pharmacist

## 2016-10-17 NOTE — Telephone Encounter (Signed)
Torsemide appeal approved through 10/05/17.   Ruta Hinds. Velva Harman, PharmD, BCPS, CPP Clinical Pharmacist Pager: [phone] Phone: [phone] 10/17/2016 10:21 AM

## 2016-10-18 ENCOUNTER — Ambulatory Visit (HOSPITAL_COMMUNITY)
Admission: RE | Admit: 2016-10-18 | Discharge: 2016-10-18 | Disposition: A | Discharge status: Home / Self Care | Payer: Medicare Other | Source: Ambulatory Visit | Attending: Cardiology | Admitting: Cardiology

## 2016-10-18 ENCOUNTER — Encounter (HOSPITAL_COMMUNITY): Payer: Self-pay

## 2016-10-18 VITALS — BP 102/63 | HR 79 | Wt 196.8 lb

## 2016-10-18 DIAGNOSIS — Z79899 Other long term (current) drug therapy: Secondary | ICD-10-CM | POA: Diagnosis not present

## 2016-10-18 DIAGNOSIS — N183 Chronic kidney disease, stage 3 (moderate): Secondary | ICD-10-CM | POA: Diagnosis not present

## 2016-10-18 DIAGNOSIS — E669 Obesity, unspecified: Secondary | ICD-10-CM | POA: Insufficient documentation

## 2016-10-18 DIAGNOSIS — I5022 Chronic systolic (congestive) heart failure: Secondary | ICD-10-CM | POA: Diagnosis not present

## 2016-10-18 DIAGNOSIS — I4891 Unspecified atrial fibrillation: Secondary | ICD-10-CM | POA: Diagnosis not present

## 2016-10-18 DIAGNOSIS — Z7982 Long term (current) use of aspirin: Secondary | ICD-10-CM | POA: Diagnosis not present

## 2016-10-18 DIAGNOSIS — Z952 Presence of prosthetic heart valve: Secondary | ICD-10-CM | POA: Diagnosis not present

## 2016-10-18 DIAGNOSIS — I130 Hypertensive heart and chronic kidney disease with heart failure and stage 1 through stage 4 chronic kidney disease, or unspecified chronic kidney disease: Secondary | ICD-10-CM | POA: Diagnosis not present

## 2016-10-18 DIAGNOSIS — R55 Syncope and collapse: Secondary | ICD-10-CM | POA: Diagnosis not present

## 2016-10-18 DIAGNOSIS — Z7901 Long term (current) use of anticoagulants: Secondary | ICD-10-CM | POA: Insufficient documentation

## 2016-10-18 LAB — COMPREHENSIVE METABOLIC PANEL
ALT: 49 U/L (ref 17–63)
AST: 54 U/L — AB (ref 15–41)
Albumin: 4.2 g/dL (ref 3.5–5.0)
Alkaline Phosphatase: 108 U/L (ref 38–126)
Anion gap: 14 (ref 5–15)
BUN: 17 mg/dL (ref 6–20)
CHLORIDE: 95 mmol/L — AB (ref 101–111)
CO2: 29 mmol/L (ref 22–32)
CREATININE: 1.43 mg/dL — AB (ref 0.61–1.24)
Calcium: 9.5 mg/dL (ref 8.9–10.3)
GFR calc Af Amer: 54 mL/min — ABNORMAL LOW (ref >60)
GFR calc non Af Amer: 46 mL/min — ABNORMAL LOW (ref >60)
Glucose, Bld: 134 mg/dL — ABNORMAL HIGH (ref 65–99)
POTASSIUM: 3.9 mmol/L (ref 3.5–5.1)
SODIUM: 138 mmol/L (ref 135–145)
Total Bilirubin: 1.8 mg/dL — ABNORMAL HIGH (ref 0.3–1.2)
Total Protein: 6.6 g/dL (ref 6.5–8.1)

## 2016-10-18 LAB — CBC
HEMATOCRIT: 40.9 % (ref 39.0–52.0)
HEMOGLOBIN: 13.3 g/dL (ref 13.0–17.0)
MCH: 19.1 pg — ABNORMAL LOW (ref 26.0–34.0)
MCHC: 32.5 g/dL (ref 30.0–36.0)
MCV: 58.6 fL — AB (ref 78.0–100.0)
Platelets: 172 10*3/uL (ref 150–400)
RBC: 6.98 MIL/uL — ABNORMAL HIGH (ref 4.22–5.81)
RDW: 18.7 % — AB (ref 11.5–15.5)
WBC: 10.6 10*3/uL — AB (ref 4.0–10.5)

## 2016-10-18 LAB — URINALYSIS, ROUTINE W REFLEX MICROSCOPIC
Bilirubin Urine: NEGATIVE
GLUCOSE, UA: NEGATIVE mg/dL
Hgb urine dipstick: NEGATIVE
KETONES UR: NEGATIVE mg/dL
LEUKOCYTES UA: NEGATIVE
Nitrite: NEGATIVE
PH: 5 (ref 5.0–8.0)
Protein, ur: NEGATIVE mg/dL
SPECIFIC GRAVITY, URINE: 1.01 (ref 1.005–1.030)

## 2016-10-18 LAB — TSH: TSH: 3.254 u[IU]/mL (ref 0.350–4.500)

## 2016-10-18 LAB — PROTIME-INR: INR: 2.1 — AB (ref 0.9–1.1)

## 2016-10-18 MED ORDER — LOSARTAN POTASSIUM 25 MG PO TABS: 25.0000 mg | ORAL_TABLET | Freq: Every day | ORAL | 3 refills | Status: DC

## 2016-10-18 NOTE — Patient Instructions (Signed)
Increase Losartan to 25 mg at bedtime  Labs today  Labs in 10 days  Your physician has recommended that you have a pulmonary function test. Pulmonary Function Tests are a group of tests that measure how well air moves in and out of your lungs.  You have been referred to Dr Rayann Heman or Dr Curt Bears for a-fib ablation  Your physician recommends that you schedule a follow-up appointment in: May with echocardiogram

## 2016-10-19 NOTE — Progress Notes (Signed)
PCP: Dr Rex Kras - Also manages [facility] Cardiology: Dr. [place]  HF Cardiology: Dr Aundra Dubin   HPI: Mr Dolney is a 76 year old male with a history of severe AS s/p St Jude mechanical AVR in 03/2003 on chronic coumadin, HTN, obesity, and mild LV dysfxn EF 40-45% on 01/2016 echo. Prior to admit in 1/18, he had a cough and dyspnea that was thought to be bronchitis so he was treated with abx and oral steroids. He was seen in cardiology office in 1/18 and was noted to be in rapid atrial fibrillation with significant volume overload.   Admitted 1/23 through 08/29/16 with marked volume overload, lower extremity edema, and atrial fibrillation with RVR.  Initial cardioversion failed and he was difficult to diurese. He was placed on milrinone for low output and diuresed with IV lasix with good response.  Weight came down considerably. Amiodarone was begun. Once fully diuresed, he had successful DC-CV on 08/28/16. Discharged on torsemide 60 mg daily. Discharge weight 212 pounds.   He continued to lose weight as an outpatient.  He had a syncopal episode where he stood up from sitting and got lightheaded with transient loss of consciousness.  He had been having orthostatic symptoms.  We saw him in the office and cut back on torsemide from 60 mg daily down eventually to 20 mg daily.  Creatinine up some to 1.7 at that time.    At last appointment, he was in atrial fibrillation.  He was set up for DCCV in 3/18, but was in NSR when he arrived to short stay. He is in NSR today.   He is doing well currently.  No palpitations.  Lightheaded only if he stands up fast from lying down.  Generally ok when he changes positions gradually.  No further syncope. No dyspnea walking on flat ground.  Mild dypsnea with steps.  No chest pain.  No BRBPR/melena.  He is wearing compression stockings.  No orthopnea/PND.   Labs (09/04/2016):  Hgb 15.9, K 5.0, Creatinine 1.77, digoxin 0.7, BNP 326  Labs (3/18): digoxin 0.8, K 4.8, creatinine 1.44  ECG  (personally reviewed): NSR, 1st degree AV block, poor RWP, RBBB, LAFB.   PMH: 1. Aortic stenosis: # 25 St Jude mechanical aortic valve in 9/14.   2. Chronic systolic CHF: Echo in 2/33 with EF 45-50%.  Admitted in 1/18 with afib with RVR, ?tachycardia-mediated cardiomyopathy.  - Echo (1/18) with EF 25-30%, stable mechanical aortic valve.  - TEE (1/18) with EF 20-25%, stable mechanical aortic valve, moderate MR, small PFO.  - Admission 1/18 with acute on chronic systolic CHF, required milrinone, extensively diuresed.  3. Atrial fibrillation: First noted in 0/07, uncertain how long it had been present (with RVR).  08/28/2016 Successful DC-CV --> NSR.  Back in atrial fibrillation at 09/18/16 office visit.  Set up for DCCV in 3/18 but back in NSR.   4. 09/04/2016 Syncope--> over-diuresis most likely.  5. HTN 6. Ascending aortic aneurysm: 4.4 cm on 7/17 echo.  7. Prostate cancer: Treated with radioactive seed implantation.  8. Nephrolithiasis.   ROS: All systems negative except as listed in HPI, PMH and Problem List.  SH:  Social History   Social History  . Marital status: Married    Spouse name: N/A  . Number of children: 4  . 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 Smoking status: Never Smoker  . Smokeless tobacco: Never  Used  . Alcohol use 1.8 oz/week    3 Cans of beer per week     Comment:  "may have 2-3 beers on the weekend"  . Drug use: No  . Sexual activity: Not on file   Other Topics Concern  . Not on file   Social History Narrative  . No narrative on file    FH:  Family History  Problem Relation Age of Onset  . Heart failure Mother   . Diabetes Mother   . Prostate cancer Father     Current Outpatient Prescriptions  Medication Sig Dispense Refill  . allopurinol (ZYLOPRIM) 300 MG tablet Take 300 mg by mouth daily.     Marland Kitchen amiodarone (PACERONE) 200 MG tablet Take 200 mg by mouth daily.    Marland Kitchen aspirin  81 MG chewable tablet Chew 1 tablet (81 mg total) by mouth daily. 30 tablet 6  . colchicine 0.6 MG tablet Take 0.6 mg by mouth as needed (for gout).     Marland Kitchen digoxin (LANOXIN) 0.125 MG tablet Take 1 tablet (0.125 mg total) by mouth daily. 30 tablet 6  . losartan (COZAAR) 25 MG tablet Take 1 tablet (25 mg total) by mouth at bedtime. 30 tablet 3  . metoprolol succinate (TOPROL-XL) 25 MG 24 hr tablet Take 1 tablet (25 mg total) by mouth daily. 30 tablet 6  . Psyllium (METAMUCIL PO) Take 2 capsules by mouth 2 (two) times daily.     Marland Kitchen spironolactone (ALDACTONE) 25 MG tablet Take 0.5 tablets (12.5 mg total) by mouth daily. 30 tablet 6  . tamsulosin (FLOMAX) 0.4 MG CAPS capsule Take 0.4 mg by mouth every other day.     . tolterodine (DETROL LA) 4 MG 24 hr capsule Take 4 mg by mouth daily.   0  . torsemide (DEMADEX) 20 MG tablet Take 20 mg by mouth daily.    Marland Kitchen warfarin (COUMADIN) 5 MG tablet Take 0.5-1 tablets (2.5-5 mg total) by mouth See admin instructions. Take 5 mg by mouth Monday, Wednesday, Friday and Sunday. Take 2.5 mg by mouth on all other days. 45 tablet 6   No current facility-administered medications for this encounter.     Vitals:   10/18/16 1501  BP: 102/63  Pulse: 79  SpO2: 97%  Weight: 196 lb 12 oz (89.2 kg)   Filed Weights   10/18/16 1501  Weight: 196 lb 12 oz (89.2 kg)   PHYSICAL EXAM: General:  NAD  HEENT: normal Neck: supple. JVP 7 cm. Carotids 2+ bilaterally; no bruits. No lymphadenopathy or thryomegaly appreciated. Cor: PMI lateral. Regular. S1/S2, mechanical S2, no S3/S4. 1/6 SEM RUSB.  Lungs: CTAB Abdomen: soft, nontender, nondistended. No hepatosplenomegaly. No bruits or masses. Good bowel sounds. Extremities: no cyanosis, clubbing, rash, no lower extremity edema.  Neuro: alert & orientedx3, cranial nerves grossly intact. Moves all 4 extremities w/o difficulty. Affect pleasant   ASSESSMENT & PLAN: 1. Chronic systolic CHF: EF on TEE in 1/18 20-25%, mechanical aortic  valve ok.  Fall in EF may be related to tachycardia-mediated CMP but he has not had a coronary disease workup (echo 7/17 with EF 45-50%).  No chest pain.  He required milrinone to assess diuresis during 1/18-2/18 admission.  Currently, NYHA class II symptoms and does not appear volume overloaded.  Medication titration is limited by soft BP and orthostasis.  - Continue current dose to torsemide, 20 mg daily.  Needs BMET today.  - Continue digoxin 0.125 daily, recent level ok.    - Continue  spironolactone 12.5 daily, K runs high normal so will not increase today.   - Can continue low dose Toprol XL.  - Increase losartan to 25 mg qhs, check BMET today and in 10 days.  - Given no ACS/chest pain, will repeat echo in 5/18 with followup.  If pure tachycardia-mediated cardiomyopathy, would expect EF to improve to baseline.  If EF remains low, should have coronary angiography.  - He will start cardiac rehab soon.  2. Mechanical aortic valve: Continue warfarin and ASA 81. Valve looked ok on 1/18 TEE. 3. Atrial fibrillation: Admitted 1/18 with atrial fibrillation with RVR and CHF.  EF down to 20-25%.  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  Need to keep him in NSR if possible.  DCCV during 2/18 admission and in NSR today. - Continue amiodarone 200 daily.  Will need to follow LFTs/TSH, check today.  Will need regular eye exam.  Will need baseline PFTs, will order today.  - Long-term, would consider atrial fibrillation ablation.  Will refer him to see Dr. Rayann Heman or Dr. Curt Bears.  4. CKD: Stage III.  BMET today.  5. Syncope: Suspect prior episode of syncope was due to over-diuresis (orthostatic).   Followup in 5/18 with echo.   [name] 10/19/2016

## 2016-10-29 ENCOUNTER — Telehealth: Payer: Self-pay | Admitting: Physician Assistant

## 2016-10-29 MED ORDER — LOSARTAN POTASSIUM 25 MG PO TABS: 25.0000 mg | ORAL_TABLET | Freq: Every day | ORAL | 11 refills | Status: DC

## 2016-10-29 NOTE — Telephone Encounter (Signed)
Pharmacy called to get Losartan prescription updated.  Dose was recently changed to 25 mg QD. New Rx called in to pharmacy.

## 2016-10-30 ENCOUNTER — Inpatient Hospital Stay (HOSPITAL_COMMUNITY): Admission: RE | Admit: 2016-10-30 | Payer: Medicare Other | Source: Ambulatory Visit

## 2016-10-30 ENCOUNTER — Encounter (HOSPITAL_COMMUNITY): Payer: Medicare Other

## 2016-11-01 ENCOUNTER — Encounter: Payer: Self-pay | Admitting: Cardiology

## 2016-11-01 ENCOUNTER — Ambulatory Visit (INDEPENDENT_AMBULATORY_CARE_PROVIDER_SITE_OTHER): Payer: Medicare Other | Admitting: Cardiology

## 2016-11-01 ENCOUNTER — Encounter: Payer: Self-pay | Admitting: *Deleted

## 2016-11-01 ENCOUNTER — Other Ambulatory Visit: Payer: Self-pay | Admitting: Cardiology

## 2016-11-01 VITALS — BP 114/72 | HR 68 | Ht 70.0 in | Wt 194.8 lb

## 2016-11-01 DIAGNOSIS — I481 Persistent atrial fibrillation: Secondary | ICD-10-CM

## 2016-11-01 DIAGNOSIS — I4819 Other persistent atrial fibrillation: Secondary | ICD-10-CM

## 2016-11-01 NOTE — Progress Notes (Signed)
Electrophysiology Office Note   Date:  11/01/2016   ID:  Adrian Lambert, DOB Dec 21, 1940, MRN [id number]  PCP:  Gennette Pac, MD  Cardiologist:  [place], [place] Primary Electrophysiologist:  Constance Haw, MD    Chief Complaint  Patient presents with  . Advice Only    PAF     History of Present Illness: Adrian Lambert is a 76 y.o. male who is being seen today for the evaluation of atrial fibrillation at the request of Hulan Fess, MD. Presenting today for electrophysiology evaluation. 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 He was admitted on 123 with volume overload, lower extremity edema and atrial fibrillation with rapid rates. His initial cardioversion attempt failed. He was diuresed with IV Lasix and was started on amiodarone and had successful cardioversion on 08/28/16. He was seen in heart failure clinic and was noted to be in atrial fibrillation. We'll set up for cardioversion on 3/18 but was in sinus rhythm when he arrived.    Today, he denies symptoms of palpitations, chest pain, orthopnea, PND, lower extremity edema, claudication, dizziness, presyncope, syncope, bleeding, or neurologic sequela. The patient is tolerating medications without difficulties. He says that he can walk up to half a mile on flat ground. He does get short of breath when climbing stairs. His main complaint is fatigue.   Past Medical History:  Diagnosis Date  . Anticoagulant long-term use   . Arthritis    "probably in my thumbs" (06/26/2012)  . Bifascicular block   . Chronic combined systolic and diastolic CHF (congestive heart failure) ([place])    a. 01/2016 Echo: EF 45-50%, gr2 DD, inflat AK (poor acoustic windows);  b. 07/2016 Echo: EF 25-30%, mild LVH, PASP  70mmHg (pt in Afib).  . Complication of anesthesia    "wake up w/a start; hallucinations" (06/26/2012)  . Critical Aortic Stenosis    a. 03/2003 s/p SJM #25 mech prosthetic AoV;  b. 07/2016 Echo: EF 25-30% (in setting of AF), AoV area 1.72 cm^2 (VTI), 1.75 cm^2 (Vmean).  . Diverticulosis   . Gouty arthritis    "have had it in both feet, ankles, right knee" (06/26/2012)  . History of diverticulitis of colon   . History of kidney stones   . History of pancreatitis   . Hypertension   . Incomplete RBBB   . PAF (paroxysmal atrial fibrillation) ([facility])    a. CHA2DS2VASc = 4-->chronic coumadin in setting of mech AoV;  b. 07/2016 Recurrent AF RVR.   Past Surgical History:  Procedure Laterality Date  . AORTIC VALVE REPLACEMENT  04-14-2003  DR Servando Snare   #56mm ST JUDE MECHNICAL PROSTHESIS  . CARDIOVERSION N/A 08/18/2016   Procedure: CARDIOVERSION;  Surgeon: Thayer Headings, MD;  Location: [facility] ENDOSCOPY;  Service: Cardiovascular;  Laterality: N/A;  . CARDIOVERSION N/A 08/28/2016   Procedure: CARDIOVERSION;  Surgeon: Larey Dresser, MD;  Location: [facility];  Service: Cardiovascular;  Laterality: N/A;  . CATARACT EXTRACTION W/ INTRAOCULAR LENS  IMPLANT, BILATERAL  RIGHT 2010/  LEFT DEC 2013  . HEMICOLECTOMY  10/06/2003   Laparoscopic-assisted left hemicolectomy for diverticulitis  . LAPAROSCOPIC CHOLECYSTECTOMY  07-23-2006  . PROSTATE BIOPSY  10/09/2012   gleason 3+4=7  . RADIOACTIVE SEED IMPLANT N/A 01/09/2013   Procedure: RADIOACTIVE SEED IMPLANT;  Surgeon: Franchot Gallo, MD;  Location:  [facility];  Service: Urology;  Laterality: N/A;  . TEE WITHOUT CARDIOVERSION N/A 08/18/2016   Procedure: TRANSESOPHAGEAL ECHOCARDIOGRAM (TEE);  Surgeon: Thayer Headings, MD;  Location: [facility];  Service: Cardiovascular;  Laterality: N/A;  . TRANSTHORACIC ECHOCARDIOGRAM  04-28-2008   MILD LVH / NORMAL LSF/ NORMAL AORTIC VALVE MECHANICAL PROSTHESIS FUNCTION/ MILD LAE/ EF 55-60%     Current  Outpatient Prescriptions  Medication Sig Dispense Refill  . allopurinol (ZYLOPRIM) 300 MG tablet Take 300 mg by mouth daily.     Marland Kitchen amiodarone (PACERONE) 200 MG tablet Take 200 mg by mouth daily.    Marland Kitchen aspirin 81 MG chewable tablet Chew 1 tablet (81 mg total) by mouth daily. 30 tablet 6  . colchicine 0.6 MG tablet Take 0.6 mg by mouth as needed (for gout).     Marland Kitchen digoxin (LANOXIN) 0.125 MG tablet Take 1 tablet (0.125 mg total) by mouth daily. 30 tablet 6  . losartan (COZAAR) 25 MG tablet Take 1 tablet (25 mg total) by mouth at bedtime. 30 tablet 11  . metoprolol succinate (TOPROL-XL) 25 MG 24 hr tablet Take 1 tablet (25 mg total) by mouth daily. 30 tablet 6  . Psyllium (METAMUCIL PO) Take 2 capsules by mouth 2 (two) times daily.     Marland Kitchen spironolactone (ALDACTONE) 25 MG tablet Take 25 mg by mouth daily.    . tamsulosin (FLOMAX) 0.4 MG CAPS capsule Take 0.4 mg by mouth every other day.     . tolterodine (DETROL LA) 4 MG 24 hr capsule Take 4 mg by mouth daily.   0  . torsemide (DEMADEX) 20 MG tablet Take 20 mg by mouth daily.    Marland Kitchen warfarin (COUMADIN) 5 MG tablet Take 0.5-1 tablets (2.5-5 mg total) by mouth See admin instructions. Take 5 mg by mouth Monday, Wednesday, Friday and Sunday. Take 2.5 mg by mouth on all other days. 45 tablet 6   No current facility-administered medications for this visit.     Allergies:   Celecoxib   Social History:  The patient  reports that he has never smoked. He has never used smokeless tobacco. He reports that he drinks about 1.8 oz of alcohol per week . He reports that he does not use drugs.   Family History:  The patient's family history includes Diabetes in his mother; Heart failure in his mother; Prostate cancer in his father.    ROS:  Please see the history of present illness.   Otherwise, review of systems is positive for DOE, fatigue.   All other systems are reviewed and negative.    PHYSICAL EXAM: VS:  BP 114/72   Pulse 68   Ht 5\' 10"  (1.778 m)   Wt  194 lb 12.8 oz (88.4 kg)   BMI 27.95 kg/m  , BMI Body mass index is 27.95 kg/m. GEN: Well nourished, well developed, in no acute distress  HEENT: normal  Neck: no JVD, carotid bruits, or masses Cardiac: RRR; mechanical click, no murmurs, rubs, or gallops,no edema  Respiratory:  clear to auscultation bilaterally, normal work of breathing GI: soft, nontender, nondistended, + BS MS: no deformity or atrophy  Skin: warm and dry Neuro:  Strength and sensation are intact Psych: euthymic mood, full affect  EKG:  EKG is not ordered today. Personal review of the ekg ordered 10/18/16 shows sinus rhythm, RBBB, LAFB, possible septal infarct  Recent Labs: 08/18/2016: Magnesium 2.2 09/04/2016: B Natriuretic Peptide 325.9 10/18/2016: ALT 49; BUN 17; Creatinine, Ser 1.43; Hemoglobin 13.3;  Platelets 172; Potassium 3.9; Sodium 138; TSH 3.254    Lipid Panel  No results found for: CHOL, TRIG, HDL, CHOLHDL, VLDL, LDLCALC, LDLDIRECT   Wt Readings from Last 3 Encounters:  11/01/16 194 lb 12.8 oz (88.4 kg)  10/18/16 196 lb 12 oz (89.2 kg)  09/22/16 201 lb (91.2 kg)      Other studies Reviewed: Additional studies/ records that were reviewed today include: TTE 08/11/16, TEE 08/18/16  Review of the above records today demonstrates:  - Left ventricle: The cavity size was normal. Wall thickness was   increased in a pattern of mild LVH. Systolic function was   severely reduced. The estimated ejection fraction was in the   range of 25% to 30%. - Aortic valve: A bioprosthesis was present. Valve area (VTI): 1.72   cm^2. Valve area (Vmax): 1.74 cm^2. Valve area (Vmean): 1.75   cm^2. - Right atrium: The atrium was mildly dilated. - Pulmonary arteries: Systolic pressure was mildly increased. PA   peak pressure: 33 mm Hg (S).  - Left ventricle: Systolic function was severely reduced. The   estimated ejection fraction was in the range of 20% to 25%. - Aortic valve: A mechanical prosthesis was present. There  was   trivial regurgitation. - Mitral valve: No evidence of vegetation. There was moderate   regurgitation. - Left atrium: No evidence of thrombus in the atrial cavity or   appendage. - Atrial septum: There was a small patent foramen ovale. There was   a bidirectional shunt through a patent foramen ovale.   ASSESSMENT AND PLAN:  1.  Atrial fibrillation: Currently on amiodarone. Warfarin for anticoagulation. 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 I discussed with him further options of therapy for his atrial fibrillation. We discussed both medical and ablative options. He would like to be off of his antiarrhythmic medications. Risks and benefits of ablation were discussed. Risks include bleeding, tamponade, heart block, stroke, and damage to surrounding organs. He understands these risks and has agreed to ablation.  This patients CHA2DS2-VASc Score and unadjusted Ischemic Stroke Rate (% per year) is equal to 4.8 % stroke rate/year from a score of 4  Above score calculated as 1 point each if present [CHF, HTN, DM, Vascular=MI/PAD/Aortic Plaque, Age if 65-74, or Male] Above score calculated as 2 points each if present [Age > 75, or Stroke/TIA/TE]   2. Chronic systolic heart failure: EF 20-25% to possibly related to a tachycardia myopathy due to his atrial fibrillation. Currently on optimal medical therapy with Aldactone, losartan, and Toprol-XL. Signa Cheek plan for a rhythm control strategy for his atrial fibrillation.  3. Aortic stenosis status post St. Jude mechanical valve: Valve functioning appropriately on recent echo. No changes necessary today.    Current medicines are reviewed at length with the patient today.   The patient does not have concerns regarding his medicines.  The following changes were made today:  none  Labs/ tests ordered today include:  No orders of the defined types were placed in this encounter.    Disposition:   FU with  Narda Fundora 3 months  Signed, Syrenity Klepacki Meredith Leeds, MD  11/01/2016 3:39 PM     [address] [place] [id number] [phone] (office) [phone] (fax)

## 2016-11-01 NOTE — Patient Instructions (Signed)
Medication Instructions:    Your physician recommends that you continue on your current medications as directed. Please refer to the Current Medication list given to you today.  --- If you need a refill on your cardiac medications before your next appointment, please call your pharmacy. ---  Labwork:  Please have pre procedure lab work drawn at your primary doctor's office between 4/24-5/5.  You will need a BMET, CBC w/ diff, & INR  Testing/Procedures: Your physician has recommended that you have an ablation. Catheter ablation is a medical procedure used to treat some cardiac arrhythmias (irregular heartbeats). During catheter ablation, a long, thin, flexible tube is put into a blood vessel in your groin (upper thigh), or neck. This tube is called an ablation catheter. It is then guided to your heart through the blood vessel. Radio frequency waves destroy small areas of heart tissue where abnormal heartbeats may cause an arrhythmia to start. Please see the instruction sheet given to you today.  Follow-Up:  Office will call you to arrange post ablation follow up appointments.  4 weeks post ablation with Roderic Palau, NP in the AFib clinic  3 months post ablation with Dr. Curt Bears  Thank you for choosing [facility]!!   Trinidad Curet, RN [phone]    Any Other Special Instructions Will Be Listed Below (If Applicable).   Cardiac Ablation Cardiac ablation is a procedure to disable (ablate) a small amount of heart tissue in very specific places. The heart has many electrical connections. Sometimes these connections are abnormal and can cause the heart to beat very fast or irregularly. Ablating some of the problem areas can improve the heart rhythm or return it to normal. Ablation may be done for people who:  Have Wolff-Parkinson-White syndrome.  Have fast heart rhythms (tachycardia).  Have taken medicines for an abnormal heart rhythm (arrhythmia) that were not effective or  caused side effects.  Have a high-risk heartbeat that may be life-threatening. During the procedure, a small incision is made in the neck or the groin, and a long, thin, flexible tube (catheter) is inserted into the incision and moved to the heart. Small devices (electrodes) on the tip of the catheter will send out electrical currents. A type of X-ray (fluoroscopy) will be used to help guide the catheter and to provide images of the heart. Tell a health care provider about:  Any allergies you have.  All medicines you are taking, including vitamins, herbs, eye drops, creams, and over-the-counter medicines.  Any problems you or family members have had with anesthetic medicines.  Any blood disorders you have.  Any surgeries you have had.  Any medical conditions you have, such as kidney failure.  Whether you are pregnant or may be pregnant. What are the risks? Generally, this is a safe procedure. However, problems may occur, including:  Infection.  Bruising and bleeding at the catheter insertion site.  Bleeding into the chest, especially into the sac that surrounds the heart. This is a serious complication.  Stroke or blood clots.  Damage to other structures or organs.  Allergic reaction to medicines or dyes.  Need for a permanent pacemaker if the normal electrical system is damaged. A pacemaker is a small computer that sends electrical signals to the heart and helps your heart beat normally.  The procedure not being fully effective. This may not be recognized until months later. Repeat ablation procedures are sometimes required. What happens before the procedure?  Follow instructions from your health care provider about eating or  drinking restrictions.  Ask your health care provider about:  Changing or stopping your regular medicines. This is especially important if you are taking diabetes medicines or blood thinners.  Taking medicines such as aspirin and ibuprofen. These  medicines can thin your blood. Do not take these medicines before your procedure if your health care provider instructs you not to.  Plan to have someone take you home from the hospital or clinic.  If you will be going home right after the procedure, plan to have someone with you for 24 hours. What happens during the procedure?  To lower your risk of infection:  Your health care team will wash or sanitize their hands.  Your skin will be washed with soap.  Hair may be removed from the incision area.  An IV tube will be inserted into one of your veins.  You will be given a medicine to help you relax (sedative).  The skin on your neck or groin will be numbed.  An incision will be made in your neck or your groin.  A needle will be inserted through the incision and into a large vein in your neck or groin.  A catheter will be inserted into the needle and moved to your heart.  Dye may be injected through the catheter to help your surgeon see the area of the heart that needs treatment.  Electrical currents will be sent from the catheter to ablate heart tissue in desired areas. There are three types of energy that may be used to ablate heart tissue:  Heat (radiofrequency energy).  Laser energy.  Extreme cold (cryoablation).  When the necessary tissue has been ablated, the catheter will be removed.  Pressure will be held on the catheter insertion area to prevent excessive bleeding.  A bandage (dressing) will be placed over the catheter insertion area. The procedure may vary among health care providers and hospitals. What happens after the procedure?  Your blood pressure, heart rate, breathing rate, and blood oxygen level will be monitored until the medicines you were given have worn off.  Your catheter insertion area will be monitored for bleeding. You will need to lie still for a few hours to ensure that you do not bleed from the catheter insertion area.  Do not drive for 24  hours or as long as directed by your health care provider. Summary  Cardiac ablation is a procedure to disable (ablate) a small amount of heart tissue in very specific places. Ablating some of the problem areas can improve the heart rhythm or return it to normal.  During the procedure, electrical currents will be sent from the catheter to ablate heart tissue in desired areas. This information is not intended to replace advice given to you by your health care provider. Make sure you discuss any questions you have with your health care provider. Document Released: 11/26/2008 Document Revised: 05/29/2016 Document Reviewed: 05/29/2016 Elsevier Interactive Patient Education  2017 [employer].

## 2016-11-02 ENCOUNTER — Ambulatory Visit (HOSPITAL_COMMUNITY)
Admission: RE | Admit: 2016-11-02 | Discharge: 2016-11-02 | Disposition: A | Discharge status: Home / Self Care | Payer: Medicare Other | Source: Ambulatory Visit | Attending: Cardiology | Admitting: Cardiology

## 2016-11-02 DIAGNOSIS — I5022 Chronic systolic (congestive) heart failure: Secondary | ICD-10-CM

## 2016-11-02 DIAGNOSIS — I4891 Unspecified atrial fibrillation: Secondary | ICD-10-CM | POA: Diagnosis not present

## 2016-11-02 LAB — BASIC METABOLIC PANEL
ANION GAP: 8 (ref 5–15)
BUN: 18 mg/dL (ref 6–20)
CO2: 29 mmol/L (ref 22–32)
Calcium: 9.4 mg/dL (ref 8.9–10.3)
Chloride: 100 mmol/L — ABNORMAL LOW (ref 101–111)
Creatinine, Ser: 1.31 mg/dL — ABNORMAL HIGH (ref 0.61–1.24)
GFR calc Af Amer: 60 mL/min — ABNORMAL LOW (ref >60)
GFR calc non Af Amer: 52 mL/min — ABNORMAL LOW (ref >60)
GLUCOSE: 192 mg/dL — AB (ref 65–99)
POTASSIUM: 4.3 mmol/L (ref 3.5–5.1)
Sodium: 137 mmol/L (ref 135–145)

## 2016-11-02 LAB — PULMONARY FUNCTION TEST
DL/VA % PRED: 72 %
DL/VA: 3.34 ml/min/mmHg/L
DLCO unc % pred: 50 %
DLCO unc: 16.46 ml/min/mmHg
FEF 25-75 Pre: 2.72 L/sec
FEF2575-%PRED-PRE: 123 %
FEV1-%PRED-PRE: 82 %
FEV1-Pre: 2.5 L
FEV1FVC-%PRED-PRE: 113 %
FEV6-%PRED-PRE: 77 %
FEV6-PRE: 3.05 L
FEV6FVC-%Pred-Pre: 106 %
FVC-%PRED-PRE: 72 %
FVC-PRE: 3.06 L
Pre FEV1/FVC ratio: 82 %
Pre FEV6/FVC Ratio: 100 %
RV % PRED: 100 %
RV: 2.58 L
TLC % PRED: 82 %
TLC: 5.83 L

## 2016-11-03 NOTE — Addendum Note (Signed)
Addended by: Stanton Kidney on: 11/03/2016 04:04 PM   Modules accepted: Orders

## 2016-11-09 ENCOUNTER — Telehealth (HOSPITAL_COMMUNITY): Payer: Self-pay | Admitting: *Deleted

## 2016-11-09 NOTE — Telephone Encounter (Signed)
Pulmonary function test  Order: [id number]  Status:  Edited Result - FINAL Visible to patient:  Yes (MyChart) Dx:  Atrial fibrillation, unspecified type...  Notes recorded by Kennieth Rad, RN on 11/09/2016 at 10:52 AM EDT Patient called back and he is aware and no further questions at this time. ------  Notes recorded by Scarlette Calico, RN on 11/08/2016 at 1:32 PM EDT Left message to call back ------  Notes recorded by Larey Dresser, MD on 11/05/2016 at 4:35 PM EDT He has mild restriction on his PFTs. Hopefully can stop amiodarone after ablation.

## 2016-11-09 NOTE — Telephone Encounter (Signed)
-----   Message from Adrian Meredith Leeds, MD sent at 11/09/2016  8:09 AM EDT ----- Regarding: RE: ok to proceed with cardiac rehab?? OK to proceed with rehab. Patient Adrian need 7 days after ablation until rehab can resume. ----- Message ----- From: Rowe Pavy, RN Sent: 11/08/2016   4:21 PM To: Larey Dresser, MD, Adrian Meredith Leeds, MD Subject: ok to proceed with cardiac rehab??             The above pt is scheduled to begin cardiac rehab on 4/26.  In preparation for this appt, medical review completed.  Pt is scheduled to have ablation completed on 5/8.  Do we need to hold on beginning exercise until ablation completed?  Or ok to proceed with CR as scheduled?  If so, may we have exertional high heart rate parameters to use until the ablation is completed.  Thanks for the guidance Cherre Huger, BSN Cardiac and Pulmonary Rehab Nurse Navigator

## 2016-11-09 NOTE — Telephone Encounter (Signed)
-----   Message from Larey Dresser, MD sent at 11/09/2016  1:27 PM EDT ----- Regarding: RE: ok to proceed with cardiac rehab?? Go for 130 ----- Message ----- From: Rowe Pavy, RN Sent: 11/09/2016  11:59 AM To: Larey Dresser, MD Subject: RE: ok to proceed with cardiac rehab??         Thanks For this patient who is 75 his target HR would be 116 (80% of the  age predicted max HR) I would anticipate with his Afib he may go higher what is an acceptable HR for exercise pre ablation? 100% of his age predicted HR is 145.  Your thoughts ----- Message ----- From: Larey Dresser, MD Sent: 11/08/2016  11:03 PM To: Rowe Pavy, RN Subject: RE: ok to proceed with cardiac rehab??         Ok to proceed ----- Message ----- From: Rowe Pavy, RN Sent: 11/08/2016   4:21 PM To: Larey Dresser, MD, Will Meredith Leeds, MD Subject: ok to proceed with cardiac rehab??             The above pt is scheduled to begin cardiac rehab on 4/26.  In preparation for this appt, medical review completed.  Pt is scheduled to have ablation completed on 5/8.  Do we need to hold on beginning exercise until ablation completed?  Or ok to proceed with CR as scheduled?  If so, may we have exertional high heart rate parameters to use until the ablation is completed.  Thanks for the guidance Cherre Huger, BSN Cardiac and Pulmonary Rehab Nurse Navigator

## 2016-11-13 ENCOUNTER — Telehealth (HOSPITAL_COMMUNITY): Payer: Self-pay | Admitting: *Deleted

## 2016-11-13 ENCOUNTER — Telehealth: Payer: Self-pay | Admitting: Cardiology

## 2016-11-13 DIAGNOSIS — H52223 Regular astigmatism, bilateral: Secondary | ICD-10-CM | POA: Diagnosis not present

## 2016-11-13 NOTE — Telephone Encounter (Signed)
Pt called stating he was supposed to be scheduled for a CT.  Looking through his orders it looks like Dr.Camnitz ordered it. Advised pt to call [place] office as they should be scheduling the appt since he was ordering physician .

## 2016-11-13 NOTE — Telephone Encounter (Signed)
Informed wife (DPR on file) that we are still waiting on insurance approval prior to scheduling the cardiac CT.  Explained that we will send another message to our billing dept to see status of things.  She understands the office will call once insurance has responded.

## 2016-11-13 NOTE — Telephone Encounter (Signed)
New message    Pt is calling to find out about scheduling cardiac CT. He said he's supposed to have one before his ablation, which is scheduled May 8th.

## 2016-11-14 ENCOUNTER — Telehealth (HOSPITAL_COMMUNITY): Payer: Self-pay | Admitting: Family Medicine

## 2016-11-14 ENCOUNTER — Telehealth (HOSPITAL_COMMUNITY): Payer: Self-pay | Admitting: Cardiac Rehabilitation

## 2016-11-14 ENCOUNTER — Encounter: Payer: Self-pay | Admitting: Cardiology

## 2016-11-14 ENCOUNTER — Other Ambulatory Visit: Payer: Self-pay | Admitting: *Deleted

## 2016-11-14 DIAGNOSIS — I480 Paroxysmal atrial fibrillation: Secondary | ICD-10-CM

## 2016-11-14 NOTE — Telephone Encounter (Signed)
-----   Message from Larey Dresser, MD sent at 11/14/2016  2:42 PM EDT ----- Regarding: RE: cardiac rehab  < 40 lbs lifting.  No HR restriction from what you normally would use.   ----- Message ----- From: Lowell Guitar, RN Sent: 11/14/2016  12:15 PM To: Larey Dresser, MD Subject: cardiac rehab                                  Dear Dr. Aundra Dubin,  Pt is scheduled to begin cardiac rehab.  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  Are there any activity restrictions?  Please indicate blood pressure parameters at rest and with exercise?   Thank you,  Andi Hence, RN, BSN Cardiac Pulmonary Rehab

## 2016-11-14 NOTE — Telephone Encounter (Signed)
Update:Verified [employer], insurance benefits through [employer]. No co-payment, no deductible, No Coinsurance, Out of pocket $6700/ pt has met $2162.76, 0% co-insurance. Reference [id number].... KJ

## 2016-11-16 ENCOUNTER — Encounter (HOSPITAL_COMMUNITY)
Admission: RE | Admit: 2016-11-16 | Discharge: 2016-11-16 | Disposition: A | Discharge status: Home / Self Care | Payer: Medicare Other | Source: Ambulatory Visit | Attending: Cardiology | Admitting: Cardiology

## 2016-11-16 VITALS — BP 114/70 | HR 70 | Ht 68.0 in | Wt 192.5 lb

## 2016-11-16 DIAGNOSIS — I5022 Chronic systolic (congestive) heart failure: Secondary | ICD-10-CM | POA: Diagnosis present

## 2016-11-16 DIAGNOSIS — I10 Essential (primary) hypertension: Secondary | ICD-10-CM | POA: Diagnosis not present

## 2016-11-16 DIAGNOSIS — Z7901 Long term (current) use of anticoagulants: Secondary | ICD-10-CM | POA: Diagnosis not present

## 2016-11-16 DIAGNOSIS — Z952 Presence of prosthetic heart valve: Secondary | ICD-10-CM | POA: Diagnosis not present

## 2016-11-16 NOTE — Progress Notes (Signed)
Cardiac Individual Treatment Plan  Patient Details  Name: Adrian Lambert MRN: [id number] Date of Birth: 19-Dec-1940 Referring Provider:     CARDIAC REHAB PHASE II ORIENTATION from 11/16/2016 in [place]  Referring Provider  [name] MD      Initial Encounter Date:    CARDIAC REHAB PHASE II ORIENTATION from 11/16/2016 in [place]  Date  11/16/16  Referring Provider  [name] MD      Visit Diagnosis: Heart failure, chronic systolic ([place])  Patient's Home Medications on Admission:  Current Outpatient Prescriptions:  .  allopurinol (ZYLOPRIM) 300 MG tablet, Take 300 mg by mouth daily. , Disp: , Rfl:  .  amiodarone (PACERONE) 200 MG tablet, Take 200 mg by mouth daily., Disp: , Rfl:  .  aspirin 81 MG chewable tablet, Chew 1 tablet (81 mg total) by mouth daily., Disp: 30 tablet, Rfl: 6 .  colchicine 0.6 MG tablet, Take 0.6 mg by mouth as needed (for gout). , Disp: , Rfl:  .  digoxin (LANOXIN) 0.125 MG tablet, Take 1 tablet (0.125 mg total) by mouth daily., Disp: 30 tablet, Rfl: 6 .  docusate sodium (COLACE) 100 MG capsule, Take 100 mg by mouth daily., Disp: , Rfl:  .  losartan (COZAAR) 25 MG tablet, Take 1 tablet (25 mg total) by mouth at bedtime., Disp: 30 tablet, Rfl: 11 .  metoprolol succinate (TOPROL-XL) 25 MG 24 hr tablet, Take 1 tablet (25 mg total) by mouth daily., Disp: 30 tablet, Rfl: 6 .  Psyllium (METAMUCIL PO), Take 2 capsules by mouth 2 (two) times daily. , Disp: , Rfl:  .  spironolactone (ALDACTONE) 25 MG tablet, Take 12.5 mg by mouth daily. Take 1/2 tab once daily, Disp: , Rfl:  .  tamsulosin (FLOMAX) 0.4 MG CAPS capsule, Take 0.4 mg by mouth every other day. , Disp: , Rfl:  .  tolterodine (DETROL LA) 4 MG 24 hr capsule, Take 4 mg by mouth daily. , Disp: , Rfl: 0 .  torsemide (DEMADEX) 20 MG tablet, Take 20 mg by mouth daily., Disp: , Rfl:  .  warfarin (COUMADIN) 5 MG tablet, Take 0.5-1 tablets  (2.5-5 mg total) by mouth See admin instructions. Take 5 mg by mouth Monday, Wednesday, Friday and Sunday. Take 2.5 mg by mouth on all other days. (Patient taking differently: Take 2.5-5 mg by mouth See admin instructions. Take 5 mg by mouth Monday. Take 2.5 mg by mouth on all other days.), Disp: 45 tablet, Rfl: 6  Past Medical History: Past Medical History:  Diagnosis Date  . Anticoagulant long-term use   . Arthritis    "probably in my thumbs" (06/26/2012)  . Bifascicular block   . Chronic combined systolic and diastolic CHF (congestive heart failure) ([place])    a. 01/2016 Echo: EF 45-50%, gr2 DD, inflat AK (poor acoustic windows);  b. 07/2016 Echo: EF 25-30%, mild LVH, PASP 38mmHg (pt in Afib).  . Complication of anesthesia    "wake up w/a start; hallucinations" (06/26/2012)  . Critical Aortic Stenosis    a. 03/2003 s/p SJM #25 mech prosthetic AoV;  b. 07/2016 Echo: EF 25-30% (in setting of AF), AoV area 1.72 cm^2 (VTI), 1.75 cm^2 (Vmean).  . Diverticulosis   . Gouty arthritis    "have had it in both feet, ankles, right knee" (06/26/2012)  . History of diverticulitis of colon   . History of kidney stones   . History of pancreatitis   . Hypertension   .  Incomplete RBBB   . PAF (paroxysmal atrial fibrillation) ([facility])    a. CHA2DS2VASc = 4-->chronic coumadin in setting of mech AoV;  b. 07/2016 Recurrent AF RVR.    Tobacco Use: History  Smoking Status  . Never Smoker  Smokeless Tobacco  . Never Used    Labs: Recent Review Flowsheet Data    Labs for ITP Cardiac and Pulmonary Rehab Latest Ref Rng & Units 08/25/2016 08/26/2016 08/27/2016 08/28/2016 08/29/2016   TCO2 0 - 100 mmol/L - - - - -   O2SAT % 75.7 60.7 57.9 53.0 71.6      Capillary Blood Glucose: Lab Results  Component Value Date   GLUCAP 123 (H) 01/09/2013     Exercise Target Goals: Date: 11/16/16  Exercise Program Goal: Individual exercise prescription set with THRR, safety & activity barriers. Participant demonstrates ability  to understand and report RPE using BORG scale, to self-measure pulse accurately, and to acknowledge the importance of the exercise prescription.  Exercise Prescription Goal: Starting with aerobic activity 30 plus minutes a day, 3 days per week for initial exercise prescription. Provide home exercise prescription and guidelines that participant acknowledges understanding prior to discharge.  Activity Barriers & Risk Stratification:     Activity Barriers & Cardiac Risk Stratification - 11/16/16 0810      Activity Barriers & Cardiac Risk Stratification   Activity Barriers Deconditioning;Muscular Weakness;Balance Concerns;History of Falls   Cardiac Risk Stratification High      6 Minute Walk:     6 Minute Walk    Row Name 11/16/16 1327         6 Minute Walk   Phase Initial     Distance 1046 feet     Walk Time 6 minutes     # of Rest Breaks 0     MPH 1.98     METS 1.75     RPE 12     VO2 Peak 6.13     Symptoms No     Resting HR 70 bpm     Resting BP 114/70     Max Ex. HR 77 bpm     Max Ex. BP 110/62     2 Minute Post BP 108/64        Oxygen Initial Assessment:   Oxygen Re-Evaluation:   Oxygen Discharge (Final Oxygen Re-Evaluation):   Initial Exercise Prescription:     Initial Exercise Prescription - 11/16/16 1300      Date of Initial Exercise RX and Referring Provider   Date 11/16/16   Referring Provider [name] MD     Treadmill   MPH 1.7   Grade 0   Minutes 10   METs 2.3     Recumbant Bike   Level 1.5   Minutes 10   METs 1     NuStep   Level 2   SPM 70   Minutes 10   METs 2     Prescription Details   Frequency (times per week) 3   Duration Progress to 30 minutes of continuous aerobic without signs/symptoms of physical distress     Intensity   THRR 40-80% of Max Heartrate 58-116   Ratings of Perceived Exertion 11-13   Perceived Dyspnea 0-4     Progression   Progression Continue to progress workloads to maintain intensity without  signs/symptoms of physical distress.     Resistance Training   Training Prescription Yes   Weight 2lbs   Reps 10-15      Perform Capillary Blood Glucose  checks as needed.  Exercise Prescription Changes:   Exercise Comments:   Exercise Goals and Review:     Exercise Goals    Row Name 11/16/16 0810             Exercise Goals   Increase Physical Activity Yes       Intervention Provide advice, education, support and counseling about physical activity/exercise needs.;Develop an individualized exercise prescription for aerobic and resistive training based on initial evaluation findings, risk stratification, comorbidities and participant's personal goals.       Expected Outcomes Achievement of increased cardiorespiratory fitness and enhanced flexibility, muscular endurance and strength shown through measurements of functional capacity and personal statement of participant.       Increase Strength and Stamina Yes       Intervention Provide advice, education, support and counseling about physical activity/exercise needs.;Develop an individualized exercise prescription for aerobic and resistive training based on initial evaluation findings, risk stratification, comorbidities and participant's personal goals.  get back to hiking and fishing. Learn how to exercise safely.       Expected Outcomes Achievement of increased cardiorespiratory fitness and enhanced flexibility, muscular endurance and strength shown through measurements of functional capacity and personal statement of participant.          Exercise Goals Re-Evaluation :    Discharge Exercise Prescription (Final Exercise Prescription Changes):   Nutrition:  Target Goals: Understanding of nutrition guidelines, daily intake of sodium 1500mg , cholesterol 200mg , calories 30% from fat and 7% or less from saturated fats, daily to have 5 or more servings of fruits and vegetables.  Biometrics:     Pre Biometrics - 11/16/16  1328      Pre Biometrics   Waist Circumference 42 inches   Hip Circumference 40 inches   Waist to Hip Ratio 1.05 %   Triceps Skinfold 14 mm   % Body Fat 27.7 %   Grip Strength 35 kg   Flexibility 9.25 in   Single Leg Stand 1.65 seconds       Nutrition Therapy Plan and Nutrition Goals:   Nutrition Discharge: Nutrition Scores:   Nutrition Goals Re-Evaluation:   Nutrition Goals Re-Evaluation:   Nutrition Goals Discharge (Final Nutrition Goals Re-Evaluation):   Psychosocial: Target Goals: Acknowledge presence or absence of significant depression and/or stress, maximize coping skills, provide positive support system. Participant is able to verbalize types and ability to use techniques and skills needed for reducing stress and depression.  Initial Review & Psychosocial Screening:     Initial Psych Review & Screening - 11/16/16 1608      Initial Review   Current issues with None Identified     Family Dynamics   Good Support System? Yes     Barriers   Psychosocial barriers to participate in program There are no identifiable barriers or psychosocial needs.     Screening Interventions   Interventions Encouraged to exercise      Quality of Life Scores:     Quality of Life - 11/16/16 1331      Quality of Life Scores   Health/Function Pre 24.47 %   Socioeconomic Pre 24.64 %   Psych/Spiritual Pre 30 %   Family Pre 26.4 %   GLOBAL Pre 25.93 %      PHQ-9: Recent Review Flowsheet Data    There is no flowsheet data to display.     Interpretation of Total Score  Total Score Depression Severity:  1-4 = Minimal depression, 5-9 = Mild depression, 10-14 = Moderate  depression, 15-19 = Moderately severe depression, 20-27 = Severe depression   Psychosocial Evaluation and Intervention:   Psychosocial Re-Evaluation:   Psychosocial Discharge (Final Psychosocial Re-Evaluation):   Vocational Rehabilitation: Provide vocational rehab assistance to qualifying  candidates.   Vocational Rehab Evaluation & Intervention:     Vocational Rehab - 11/16/16 1610      Initial Vocational Rehab Evaluation & Intervention   Assessment shows need for Vocational Rehabilitation No      Education: Education Goals: Education classes will be provided on a weekly basis, covering required topics. Participant will state understanding/return demonstration of topics presented.  Learning Barriers/Preferences:     Learning Barriers/Preferences - 11/16/16 1610      Learning Barriers/Preferences   Learning Barriers None   Learning Preferences Written Material;Video;Verbal Instruction;Skilled Demonstration      Education Topics: Count Your Pulse:  -Group instruction provided by verbal instruction, demonstration, patient participation and written materials to support subject.  Instructors address importance of being able to find your pulse and how to count your pulse when at home without a heart monitor.  Patients get hands on experience counting their pulse with staff help and individually.   Heart Attack, Angina, and Risk Factor Modification:  -Group instruction provided by verbal instruction, video, and written materials to support subject.  Instructors address signs and symptoms of angina and heart attacks.    Also discuss risk factors for heart disease and how to make changes to improve heart health risk factors.   Functional Fitness:  -Group instruction provided by verbal instruction, demonstration, patient participation, and written materials to support subject.  Instructors address safety measures for doing things around the house.  Discuss how to get up and down off the floor, how to pick things up properly, how to safely get out of a chair without assistance, and balance training.   Meditation and Mindfulness:  -Group instruction provided by verbal instruction, patient participation, and written materials to support subject.  Instructor addresses  importance of mindfulness and meditation practice to help reduce stress and improve awareness.  Instructor also leads participants through a meditation exercise.    Stretching for Flexibility and Mobility:  -Group instruction provided by verbal instruction, patient participation, and written materials to support subject.  Instructors lead participants through series of stretches that are designed to increase flexibility thus improving mobility.  These stretches are additional exercise for major muscle groups that are typically performed during regular warm up and cool down.   Hands Only CPR Anytime:  -Group instruction provided by verbal instruction, video, patient participation and written materials to support subject.  Instructors co-teach with AHA video for hands only CPR.  Participants get hands on experience with mannequins.   Nutrition I class: Heart Healthy Eating:  -Group instruction provided by PowerPoint slides, verbal discussion, and written materials to support subject matter. The instructor gives an explanation and review of the Therapeutic Lifestyle Changes diet recommendations, which includes a discussion on lipid goals, dietary fat, sodium, fiber, plant stanol/sterol esters, sugar, and the components of a well-balanced, healthy diet.   Nutrition II class: Lifestyle Skills:  -Group instruction provided by PowerPoint slides, verbal discussion, and written materials to support subject matter. The instructor gives an explanation and review of label reading, grocery shopping for heart health, heart healthy recipe modifications, and ways to make healthier choices when eating out.   Diabetes Question & Answer:  -Group instruction provided by PowerPoint slides, verbal discussion, and written materials to support subject matter. The instructor gives an  explanation and review of diabetes co-morbidities, pre- and post-prandial blood glucose goals, pre-exercise blood glucose goals, signs,  symptoms, and treatment of hypoglycemia and hyperglycemia, and foot care basics.   Diabetes Blitz:  -Group instruction provided by PowerPoint slides, verbal discussion, and written materials to support subject matter. The instructor gives an explanation and review of the physiology behind type 1 and type 2 diabetes, diabetes medications and rational behind using different medications, pre- and post-prandial blood glucose recommendations and Hemoglobin A1c goals, diabetes diet, and exercise including blood glucose guidelines for exercising safely.    Portion Distortion:  -Group instruction provided by PowerPoint slides, verbal discussion, written materials, and food models to support subject matter. The instructor gives an explanation of serving size versus portion size, changes in portions sizes over the last 20 years, and what consists of a serving from each food group.   Stress Management:  -Group instruction provided by verbal instruction, video, and written materials to support subject matter.  Instructors review role of stress in heart disease and how to cope with stress positively.     Exercising on Your Own:  -Group instruction provided by verbal instruction, power point, and written materials to support subject.  Instructors discuss benefits of exercise, components of exercise, frequency and intensity of exercise, and end points for exercise.  Also discuss use of nitroglycerin and activating EMS.  Review options of places to exercise outside of rehab.  Review guidelines for sex with heart disease.   Cardiac Drugs I:  -Group instruction provided by verbal instruction and written materials to support subject.  Instructor reviews cardiac drug classes: antiplatelets, anticoagulants, beta blockers, and statins.  Instructor discusses reasons, side effects, and lifestyle considerations for each drug class.   Cardiac Drugs II:  -Group instruction provided by verbal instruction and written  materials to support subject.  Instructor reviews cardiac drug classes: angiotensin converting enzyme inhibitors (ACE-I), angiotensin II receptor blockers (ARBs), nitrates, and calcium channel blockers.  Instructor discusses reasons, side effects, and lifestyle considerations for each drug class.   Anatomy and Physiology of the Circulatory System:  -Group instruction provided by verbal instruction, video, and written materials to support subject.  Reviews functional anatomy of heart, how it relates to various diagnoses, and what role the heart plays in the overall system.   Knowledge Questionnaire Score:     Knowledge Questionnaire Score - 11/16/16 1326      Knowledge Questionnaire Score   Pre Score 23/24      Core Components/Risk Factors/Patient Goals at Admission:     Personal Goals and Risk Factors at Admission - 11/16/16 1337      Core Components/Risk Factors/Patient Goals on Admission    Weight Management Yes;Weight Loss;Weight Maintenance   Intervention Weight Management: Develop a combined nutrition and exercise program designed to reach desired caloric intake, while maintaining appropriate intake of nutrient and fiber, sodium and fats, and appropriate energy expenditure required for the weight goal.;Weight Management: Provide education and appropriate resources to help participant work on and attain dietary goals.;Weight Management/Obesity: Establish reasonable short term and long term weight goals.   Expected Outcomes Short Term: Continue to assess and modify interventions until short term weight is achieved;Long Term: Adherence to nutrition and physical activity/exercise program aimed toward attainment of established weight goal;Weight Maintenance: Understanding of the daily nutrition guidelines, which includes 25-35% calories from fat, 7% or less cal from saturated fats, less than 200mg  cholesterol, less than 1.5gm of sodium, & 5 or more servings of fruits and vegetables  daily;Weight Loss: Understanding of general recommendations for a balanced deficit meal plan, which promotes 1-2 lb weight loss per week and includes a negative energy balance of [phone] kcal/d;Understanding recommendations for meals to include 15-35% energy as protein, 25-35% energy from fat, 35-60% energy from carbohydrates, less than 200mg  of dietary cholesterol, 20-35 gm of total fiber daily;Understanding of distribution of calorie intake throughout the day with the consumption of 4-5 meals/snacks   Hypertension Yes   Intervention Provide education on lifestyle modifcations including regular physical activity/exercise, weight management, moderate sodium restriction and increased consumption of fresh fruit, vegetables, and low fat dairy, alcohol moderation, and smoking cessation.;Monitor prescription use compliance.   Expected Outcomes Short Term: Continued assessment and intervention until BP is < 140/24mm HG in hypertensive participants. < 130/95mm HG in hypertensive participants with diabetes, heart failure or chronic kidney disease.;Long Term: Maintenance of blood pressure at goal levels.   Personal Goal Other Yes   Personal Goal Learn better medication management   Intervention Provide cardiac education classes geared toward medication management and risk factor modification to assist with improving pt's overall quality of life   Expected Outcomes Pt have increased knowledge and understanding of medication and modifiable risk factors      Core Components/Risk Factors/Patient Goals Review:    Core Components/Risk Factors/Patient Goals at Discharge (Final Review):    ITP Comments:     ITP Comments    Row Name 11/16/16 0809           ITP Comments Medical Director, Dr. Fransico Him          Comments: Patient attended orientation from 0800 to 1015 to review rules and guidelines for program.As a part of the orientation appt, pt competed warm up stretches and  completed 6 minute  walk test, Intitial ITP, and exercise prescription.  VSS. Telemetry-SR with first degree. Pt is scheduled for an ablation on 5/8.  Asymptomatic. Brief psychosocial assessment reveals no barriers to participation in cardiac rehab. Pt is pleasant and is looking forward to participating in cardiac rehab next week. Cherre Huger, BSN Cardiac and Training and development officer

## 2016-11-16 NOTE — Progress Notes (Signed)
Cardiac Rehab Medication Review by a Pharmacist  Does the patient  feel that his/her medications are working for him/her?  Yes  Has the patient been experiencing any side effects to the medications prescribed?  Yes  Does the patient measure his/her own blood pressure or blood glucose at home?  Yes  Does the patient have any problems obtaining medications due to transportation or finances?  No  Understanding of regimen: good Understanding of indications: good Potential of compliance: good   Pharmacist comments: 76 y/o M presents to cardiac rehab in good spirits and no distress. Med list updated. Pt endorses some dizziness sitting to standing. Counseled on the potential for low blood pressure and to follow up with his PCP if side effects continue.   Angela Burke, PharmD, BCPS Pharmacy Resident Pager: [phone] 11/16/2016 8:57 AM

## 2016-11-20 ENCOUNTER — Encounter (HOSPITAL_COMMUNITY): Payer: Self-pay

## 2016-11-20 ENCOUNTER — Encounter (HOSPITAL_COMMUNITY)
Admission: RE | Admit: 2016-11-20 | Discharge: 2016-11-20 | Disposition: A | Discharge status: Home / Self Care | Payer: Medicare Other | Source: Ambulatory Visit | Attending: Cardiology | Admitting: Cardiology

## 2016-11-20 DIAGNOSIS — I5022 Chronic systolic (congestive) heart failure: Secondary | ICD-10-CM

## 2016-11-20 NOTE — Progress Notes (Signed)
Daily Session Note  Patient Details  Name: Adrian Lambert MRN: [id number] Date of Birth: [birth year] Referring Provider:     CARDIAC REHAB PHASE II ORIENTATION from 11/16/2016 in [place]  Referring Provider  [name] MD      Encounter Date: 11/20/2016  Check In:     Session Check In - 11/20/16 0848      Check-In   Location [facility]   Staff Present Cleda Mccreedy, MS, Exercise Physiologist;Molly diVincenzo, MS, ACSM RCEP, Exercise Physiologist;Amber Fair, MS, ACSM RCEP, Exercise Physiologist;Joann Rion, RN, BSN   Supervising physician immediately available to respond to emergencies [facility] immediately available   Physician(s) Dr. Posey Pronto   Medication changes reported     No   Fall or balance concerns reported    No   Tobacco Cessation No Change   Warm-up and Cool-down Performed as group-led instruction   Resistance Training Performed Yes   VAD Patient? No     Pain Assessment   Currently in Pain? No/denies   Multiple Pain Sites No      Capillary Blood Glucose: No results found for this or any previous visit (from the past 24 hour(s)).    History  Smoking Status  . Never Smoker  Smokeless Tobacco  . Never Used    Goals Met:  Exercise tolerated well  Goals Unmet:  Not Applicable  Comments: Pt started cardiac rehab today.  Pt tolerated light exercise without difficulty. VSS, telemetry-sinus rhythm,  asymptomatic.  Medication list reconciled. Pt denies barriers to medicaiton compliance.  PSYCHOSOCIAL ASSESSMENT:  PHQ-0. Pt exhibits positive coping skills, hopeful outlook with supportive family. No psychosocial needs identified at this time, no psychosocial interventions necessary.    Pt enjoys fishing and photography, especially taking photos of his 11 grandchildren.   Pt goals for cardiac rehab are to resume usual activities without dyspnea and fatigue.   Pt oriented to exercise equipment and  routine.    Understanding verbalized.   Dr. Fransico Him is Medical Director for Cardiac Rehab at [facility].

## 2016-11-22 ENCOUNTER — Encounter (HOSPITAL_COMMUNITY)
Admission: RE | Admit: 2016-11-22 | Discharge: 2016-11-22 | Disposition: A | Discharge status: Home / Self Care | Payer: Medicare Other | Source: Ambulatory Visit | Attending: Cardiology | Admitting: Cardiology

## 2016-11-22 DIAGNOSIS — I5022 Chronic systolic (congestive) heart failure: Secondary | ICD-10-CM | POA: Diagnosis present

## 2016-11-23 ENCOUNTER — Encounter (HOSPITAL_COMMUNITY): Payer: Self-pay

## 2016-11-23 ENCOUNTER — Ambulatory Visit (HOSPITAL_COMMUNITY)
Admission: RE | Admit: 2016-11-23 | Discharge: 2016-11-23 | Disposition: A | Discharge status: Home / Self Care | Payer: Medicare Other | Source: Ambulatory Visit | Attending: Cardiology | Admitting: Cardiology

## 2016-11-23 DIAGNOSIS — I4891 Unspecified atrial fibrillation: Secondary | ICD-10-CM | POA: Diagnosis not present

## 2016-11-23 DIAGNOSIS — I480 Paroxysmal atrial fibrillation: Secondary | ICD-10-CM

## 2016-11-23 DIAGNOSIS — I712 Thoracic aortic aneurysm, without rupture: Secondary | ICD-10-CM | POA: Diagnosis not present

## 2016-11-23 MED ORDER — IOPAMIDOL (ISOVUE-370) INJECTION 76%
INTRAVENOUS | Status: AC
Start: 2016-11-23 — End: 2016-11-23
  Administered 2016-11-23: 80 mL
  Filled 2016-11-23: qty 100

## 2016-11-24 ENCOUNTER — Encounter (HOSPITAL_COMMUNITY)
Admission: RE | Admit: 2016-11-24 | Discharge: 2016-11-24 | Disposition: A | Discharge status: Home / Self Care | Payer: Medicare Other | Source: Ambulatory Visit | Attending: Cardiology | Admitting: Cardiology

## 2016-11-24 DIAGNOSIS — I5022 Chronic systolic (congestive) heart failure: Secondary | ICD-10-CM | POA: Diagnosis not present

## 2016-11-27 ENCOUNTER — Encounter (HOSPITAL_COMMUNITY)
Admission: RE | Admit: 2016-11-27 | Discharge: 2016-11-27 | Disposition: A | Discharge status: Home / Self Care | Payer: Medicare Other | Source: Ambulatory Visit | Attending: Cardiology | Admitting: Cardiology

## 2016-11-27 DIAGNOSIS — I5022 Chronic systolic (congestive) heart failure: Secondary | ICD-10-CM

## 2016-11-27 NOTE — Anesthesia Preprocedure Evaluation (Addendum)
Anesthesia Evaluation  Patient identified by MRN, date of birth, ID band Patient awake    Reviewed: Allergy & Precautions, H&P , Patient's Chart, lab work & pertinent test results, reviewed documented beta blocker date and time   Airway Mallampati: II  TM Distance: >3 FB Neck ROM: full    Dental no notable dental hx. (+) Teeth Intact   Pulmonary shortness of breath and with exertion,    Pulmonary exam normal breath sounds clear to auscultation       Cardiovascular hypertension, Pt. on medications and Pt. on home beta blockers + dysrhythmias Atrial Fibrillation  Rhythm:regular Rate:Normal     Neuro/Psych    GI/Hepatic GERD  Medicated and Controlled,  Endo/Other    Renal/GU      Musculoskeletal  (+) Arthritis , Osteoarthritis,    Abdominal   Peds  Hematology   Anesthesia Other Findings Hallucinations w/ anesthesia. We have long MAC case..... EF 25%. May add Versed if we use Ketamine as part of MAC  Reproductive/Obstetrics                            Anesthesia Physical Anesthesia Plan  ASA: II  Anesthesia Plan: MAC   Post-op Pain Management:    Induction: Intravenous  Airway Management Planned: Mask and Natural Airway  Additional Equipment:   Intra-op Plan:   Post-operative Plan:   Informed Consent: I have reviewed the patients History and Physical, chart, labs and discussed the procedure including the risks, benefits and alternatives for the proposed anesthesia with the patient or authorized representative who has indicated his/her understanding and acceptance.   Dental Advisory Given and Dental advisory given  Plan Discussed with: CRNA, Surgeon and Anesthesiologist  Anesthesia Plan Comments:        Anesthesia Quick Evaluation

## 2016-11-28 ENCOUNTER — Ambulatory Visit (HOSPITAL_COMMUNITY): Payer: Medicare Other | Admitting: Anesthesiology

## 2016-11-28 ENCOUNTER — Encounter (HOSPITAL_COMMUNITY): Payer: Self-pay | Admitting: Certified Registered Nurse Anesthetist

## 2016-11-28 ENCOUNTER — Ambulatory Visit (HOSPITAL_COMMUNITY)
Admission: RE | Admit: 2016-11-28 | Discharge: 2016-11-29 | Disposition: A | Discharge status: Home / Self Care | Payer: Medicare Other | Source: Ambulatory Visit | Attending: Cardiology | Admitting: Cardiology

## 2016-11-28 ENCOUNTER — Encounter (HOSPITAL_COMMUNITY)
Admission: RE | Disposition: A | Discharge status: Home / Self Care | Payer: Self-pay | Source: Ambulatory Visit | Attending: Cardiology

## 2016-11-28 DIAGNOSIS — I4891 Unspecified atrial fibrillation: Secondary | ICD-10-CM | POA: Diagnosis not present

## 2016-11-28 DIAGNOSIS — I480 Paroxysmal atrial fibrillation: Secondary | ICD-10-CM | POA: Diagnosis not present

## 2016-11-28 DIAGNOSIS — I10 Essential (primary) hypertension: Secondary | ICD-10-CM | POA: Diagnosis not present

## 2016-11-28 DIAGNOSIS — K219 Gastro-esophageal reflux disease without esophagitis: Secondary | ICD-10-CM | POA: Insufficient documentation

## 2016-11-28 DIAGNOSIS — Z7982 Long term (current) use of aspirin: Secondary | ICD-10-CM | POA: Insufficient documentation

## 2016-11-28 DIAGNOSIS — I429 Cardiomyopathy, unspecified: Secondary | ICD-10-CM | POA: Diagnosis not present

## 2016-11-28 DIAGNOSIS — Z79899 Other long term (current) drug therapy: Secondary | ICD-10-CM | POA: Diagnosis not present

## 2016-11-28 DIAGNOSIS — N183 Chronic kidney disease, stage 3 (moderate): Secondary | ICD-10-CM | POA: Diagnosis not present

## 2016-11-28 DIAGNOSIS — I5022 Chronic systolic (congestive) heart failure: Secondary | ICD-10-CM | POA: Insufficient documentation

## 2016-11-28 DIAGNOSIS — Z7901 Long term (current) use of anticoagulants: Secondary | ICD-10-CM | POA: Insufficient documentation

## 2016-11-28 DIAGNOSIS — I130 Hypertensive heart and chronic kidney disease with heart failure and stage 1 through stage 4 chronic kidney disease, or unspecified chronic kidney disease: Secondary | ICD-10-CM | POA: Diagnosis not present

## 2016-11-28 DIAGNOSIS — Z952 Presence of prosthetic heart valve: Secondary | ICD-10-CM | POA: Diagnosis not present

## 2016-11-28 DIAGNOSIS — I5043 Acute on chronic combined systolic (congestive) and diastolic (congestive) heart failure: Secondary | ICD-10-CM | POA: Diagnosis not present

## 2016-11-28 HISTORY — PX: ATRIAL FIBRILLATION ABLATION: EP1191

## 2016-11-28 LAB — POCT ACTIVATED CLOTTING TIME
ACTIVATED CLOTTING TIME: 312 s
ACTIVATED CLOTTING TIME: 335 s
ACTIVATED CLOTTING TIME: 180 s

## 2016-11-28 LAB — PROTIME-INR
INR: 2
Prothrombin Time: 23 seconds — ABNORMAL HIGH (ref 11.4–15.2)

## 2016-11-28 SURGERY — ATRIAL FIBRILLATION ABLATION
Anesthesia: Monitor Anesthesia Care

## 2016-11-28 MED ORDER — PROTAMINE SULFATE 10 MG/ML IV SOLN
INTRAVENOUS | Status: DC | PRN
  Administered 2016-11-28 (×4): 10 mg via INTRAVENOUS

## 2016-11-28 MED ORDER — BUPIVACAINE HCL (PF) 0.25 % IJ SOLN
INTRAMUSCULAR | Status: DC | PRN
  Administered 2016-11-28: 50 mL

## 2016-11-28 MED ORDER — FENTANYL CITRATE (PF) 100 MCG/2ML IJ SOLN
INTRAMUSCULAR | Status: DC | PRN
  Administered 2016-11-28: 100 ug via INTRAVENOUS

## 2016-11-28 MED ORDER — LIDOCAINE HCL (CARDIAC) 20 MG/ML IV SOLN
INTRAVENOUS | Status: DC | PRN
  Administered 2016-11-28: 80 mg via INTRAVENOUS

## 2016-11-28 MED ORDER — DIGOXIN 125 MCG PO TABS: 0.1250 mg | ORAL_TABLET | Freq: Every day | ORAL | Status: DC

## 2016-11-28 MED ORDER — WARFARIN SODIUM 5 MG PO TABS
5.0000 mg | ORAL_TABLET | ORAL | Status: AC
  Administered 2016-11-28: 5 mg via ORAL
  Filled 2016-11-28: qty 1

## 2016-11-28 MED ORDER — WARFARIN SODIUM 2.5 MG PO TABS: 2.5000 mg | ORAL_TABLET | ORAL | Status: DC

## 2016-11-28 MED ORDER — ROCURONIUM BROMIDE 100 MG/10ML IV SOLN
INTRAVENOUS | Status: DC | PRN
  Administered 2016-11-28: 50 mg via INTRAVENOUS
  Administered 2016-11-28: 10 mg via INTRAVENOUS

## 2016-11-28 MED ORDER — IOPAMIDOL (ISOVUE-370) INJECTION 76%
INTRAVENOUS | Status: AC
  Filled 2016-11-28: qty 50

## 2016-11-28 MED ORDER — SODIUM CHLORIDE 0.9% FLUSH
3.0000 mL | Freq: Two times a day (BID) | INTRAVENOUS | Status: DC
  Administered 2016-11-28: 3 mL via INTRAVENOUS

## 2016-11-28 MED ORDER — HEPARIN (PORCINE) IN NACL 2-0.9 UNIT/ML-% IJ SOLN
INTRAMUSCULAR | Status: AC
  Filled 2016-11-28: qty 500

## 2016-11-28 MED ORDER — TAMSULOSIN HCL 0.4 MG PO CAPS
0.4000 mg | ORAL_CAPSULE | ORAL | Status: DC
  Administered 2016-11-28: 0.4 mg via ORAL
  Filled 2016-11-28: qty 1

## 2016-11-28 MED ORDER — ASPIRIN 81 MG PO CHEW: 81.0000 mg | CHEWABLE_TABLET | Freq: Every day | ORAL | Status: DC

## 2016-11-28 MED ORDER — ONDANSETRON HCL 4 MG/2ML IJ SOLN: 4.0000 mg | Freq: Four times a day (QID) | INTRAMUSCULAR | Status: DC | PRN

## 2016-11-28 MED ORDER — DOCUSATE SODIUM 100 MG PO CAPS: 100.0000 mg | ORAL_CAPSULE | Freq: Every day | ORAL | Status: DC

## 2016-11-28 MED ORDER — METOPROLOL SUCCINATE ER 25 MG PO TB24: 25.0000 mg | ORAL_TABLET | Freq: Every day | ORAL | Status: DC

## 2016-11-28 MED ORDER — BUPIVACAINE HCL (PF) 0.25 % IJ SOLN
INTRAMUSCULAR | Status: AC
Start: 2016-11-28 — End: 2016-11-28
  Filled 2016-11-28: qty 30

## 2016-11-28 MED ORDER — HEPARIN SODIUM (PORCINE) 1000 UNIT/ML IJ SOLN
INTRAMUSCULAR | Status: DC | PRN
  Administered 2016-11-28: 2000 [IU] via INTRAVENOUS
  Administered 2016-11-28: 14000 [IU] via INTRAVENOUS
  Administered 2016-11-28: 1000 [IU] via INTRAVENOUS

## 2016-11-28 MED ORDER — SODIUM CHLORIDE 0.9% FLUSH: 3.0000 mL | INTRAVENOUS | Status: DC | PRN

## 2016-11-28 MED ORDER — ACETAMINOPHEN 325 MG PO TABS: 650.0000 mg | ORAL_TABLET | ORAL | Status: DC | PRN

## 2016-11-28 MED ORDER — BUPIVACAINE HCL (PF) 0.25 % IJ SOLN
INTRAMUSCULAR | Status: AC
  Filled 2016-11-28: qty 30

## 2016-11-28 MED ORDER — ONDANSETRON HCL 4 MG/2ML IJ SOLN
INTRAMUSCULAR | Status: DC | PRN
  Administered 2016-11-28: 4 mg via INTRAVENOUS

## 2016-11-28 MED ORDER — DOBUTAMINE IN D5W 4-5 MG/ML-% IV SOLN
INTRAVENOUS | Status: AC
Start: 2016-11-28 — End: 2016-11-28
  Filled 2016-11-28: qty 250

## 2016-11-28 MED ORDER — SPIRONOLACTONE 25 MG PO TABS: 12.5000 mg | ORAL_TABLET | Freq: Every day | ORAL | Status: DC

## 2016-11-28 MED ORDER — HEPARIN (PORCINE) IN NACL 2-0.9 UNIT/ML-% IJ SOLN
INTRAMUSCULAR | Status: DC | PRN
  Administered 2016-11-28 (×2): 1000 mL

## 2016-11-28 MED ORDER — SODIUM CHLORIDE 0.9 % IV SOLN: 250.0000 mL | INTRAVENOUS | Status: DC | PRN

## 2016-11-28 MED ORDER — PHENYLEPHRINE HCL 10 MG/ML IJ SOLN
INTRAMUSCULAR | Status: DC | PRN
  Administered 2016-11-28: 40 ug/min via INTRAVENOUS

## 2016-11-28 MED ORDER — ALLOPURINOL 300 MG PO TABS: 300.0000 mg | ORAL_TABLET | Freq: Every day | ORAL | Status: DC

## 2016-11-28 MED ORDER — LACTATED RINGERS IV SOLN
INTRAVENOUS | Status: DC | PRN
  Administered 2016-11-28: 08:00:00 via INTRAVENOUS

## 2016-11-28 MED ORDER — LOSARTAN POTASSIUM 25 MG PO TABS
25.0000 mg | ORAL_TABLET | Freq: Every day | ORAL | Status: DC
  Administered 2016-11-28: 25 mg via ORAL
  Filled 2016-11-28: qty 1

## 2016-11-28 MED ORDER — AMIODARONE HCL 200 MG PO TABS
200.0000 mg | ORAL_TABLET | Freq: Every day | ORAL | Status: DC
  Administered 2016-11-28: 200 mg via ORAL
  Filled 2016-11-28: qty 1

## 2016-11-28 MED ORDER — HEPARIN SODIUM (PORCINE) 1000 UNIT/ML IJ SOLN
INTRAMUSCULAR | Status: DC | PRN
  Administered 2016-11-28: 1000 [IU] via INTRAVENOUS

## 2016-11-28 MED ORDER — KETAMINE HCL 10 MG/ML IJ SOLN
INTRAMUSCULAR | Status: DC | PRN
  Administered 2016-11-28: 20 mg via INTRAVENOUS

## 2016-11-28 MED ORDER — HEPARIN SODIUM (PORCINE) 1000 UNIT/ML IJ SOLN
INTRAMUSCULAR | Status: AC
  Filled 2016-11-28: qty 1

## 2016-11-28 MED ORDER — DOBUTAMINE IN D5W 4-5 MG/ML-% IV SOLN
INTRAVENOUS | Status: DC | PRN
  Administered 2016-11-28: 20 ug/kg/min via INTRAVENOUS

## 2016-11-28 MED ORDER — WARFARIN - PHARMACIST DOSING INPATIENT: Freq: Every day | Status: DC

## 2016-11-28 MED ORDER — PROPOFOL 10 MG/ML IV BOLUS
INTRAVENOUS | Status: DC | PRN
  Administered 2016-11-28: 90 mg via INTRAVENOUS

## 2016-11-28 MED ORDER — TORSEMIDE 20 MG PO TABS: 20.0000 mg | ORAL_TABLET | Freq: Every day | ORAL | Status: DC

## 2016-11-28 MED ORDER — COLCHICINE 0.6 MG PO TABS: 0.6000 mg | ORAL_TABLET | Freq: Every day | ORAL | Status: DC | PRN

## 2016-11-28 MED ORDER — SUGAMMADEX SODIUM 200 MG/2ML IV SOLN
INTRAVENOUS | Status: DC | PRN
  Administered 2016-11-28: 200 mg via INTRAVENOUS

## 2016-11-28 MED ORDER — PSYLLIUM 95 % PO PACK
1.0000 | PACK | Freq: Two times a day (BID) | ORAL | Status: DC
  Administered 2016-11-28: 1 via ORAL
  Filled 2016-11-28: qty 1

## 2016-11-28 MED ORDER — FESOTERODINE FUMARATE ER 4 MG PO TB24
4.0000 mg | ORAL_TABLET | Freq: Every day | ORAL | Status: DC
  Administered 2016-11-28: 4 mg via ORAL
  Filled 2016-11-28 (×2): qty 1

## 2016-11-28 SURGICAL SUPPLY — 22 items
BAG SNAP BAND KOVER 36X36 (MISCELLANEOUS) ×2 IMPLANT
BLANKET WARM UNDERBOD FULL ACC (MISCELLANEOUS) ×2 IMPLANT
CATH SMTCH THERMOCOOL SF DF (CATHETERS) ×1 IMPLANT
CATH SOUNDSTAR ECO REPROCESSED (CATHETERS) ×1 IMPLANT
CATH VARIABLE LASSO NAV 2515 (CATHETERS) ×1 IMPLANT
CATH WEBSTER BI DIR CS D-F CRV (CATHETERS) ×1 IMPLANT
COVER SWIFTLINK CONNECTOR (BAG) ×2 IMPLANT
NDL TRANSSEPTAL BRK 98CM (NEEDLE) IMPLANT
NEEDLE TRANSSEPTAL BRK 98CM (NEEDLE) ×2 IMPLANT
PACK EP LATEX FREE (CUSTOM PROCEDURE TRAY) ×2
PACK EP LF (CUSTOM PROCEDURE TRAY) ×1 IMPLANT
PAD DEFIB LIFELINK (PAD) ×2 IMPLANT
PATCH CARTO3 (PAD) ×1 IMPLANT
PINNACLE LONG 7F 25CM (SHEATH) ×2
SHEATH AGILIS NXT 8.5F 71CM (SHEATH) ×2 IMPLANT
SHEATH AVANTI 11F 11CM (SHEATH) ×1 IMPLANT
SHEATH INTRO PINNACLE 7F 25CM (SHEATH) IMPLANT
SHEATH PINNACLE 7F 10CM (SHEATH) ×1 IMPLANT
SHEATH PINNACLE 8F 10CM (SHEATH) ×2 IMPLANT
SHEATH PINNACLE 9F 10CM (SHEATH) ×2 IMPLANT
TUBING SMART ABLATE COOLFLOW (TUBING) ×1 IMPLANT
WIRE HI TORQ VERSACORE-J 145CM (WIRE) ×1 IMPLANT

## 2016-11-28 NOTE — Transfer of Care (Signed)
Immediate Anesthesia Transfer of Care Note  Patient: Adrian Lambert  Procedure(s) Performed: Procedure(s): Atrial Fibrillation Ablation (N/A)  Patient Location: Cath Lab  Anesthesia Type:General  Level of Consciousness: awake, alert , oriented and patient cooperative  Airway & Oxygen Therapy: Patient Spontanous Breathing and Patient connected to nasal cannula oxygen  Post-op Assessment: Report given to RN, Post -op Vital signs reviewed and stable and Patient moving all extremities X 4  Post vital signs: Reviewed and stable  Last Vitals:  Vitals:   11/28/16 0544 11/28/16 1119  BP: 112/72   Pulse: 63   Temp: 36.7 C 36.4 C    Last Pain:  Vitals:   11/28/16 0544  TempSrc: Oral      Patients Stated Pain Goal: 3 (42/87/68 1157)  Complications: No apparent anesthesia complications

## 2016-11-28 NOTE — H&P (Signed)
Adrian Lambert is a 76 y.o. male with a history of atrial fibrillation and systolic heart failure with an EF of 45-50%. His EF has dropped since going into AF to 25-30%. He presents today for ablation of AF. On exam, RRR, no murmurs, lungs clear. Risks and benefits of the procedure were discussed. Risks include but not limited to bleeding, tamponade, heart block, stroke, and damage to surrounding organs. He understands the risks and has agreed to the procedure.  Neiman Roots,MD 11/28/2016 7:07 AM

## 2016-11-28 NOTE — Progress Notes (Addendum)
Site area: Right groin a 9 french venous sheath X2  Site Prior to Removal:  Level 0  Pressure Applied For 20 MINUTES    Bedrest Beginning at 1220p  Manual:   Yes.    Patient Status During Pull:  stable  Post Pull Groin Site:  Level 0  Post Pull Instructions Given:  Yes.    Post Pull Pulses Present:  Yes.    Dressing Applied:  Yes.    Comments:  VS remain stable BP 9546

## 2016-11-28 NOTE — Anesthesia Procedure Notes (Signed)
Procedure Name: Intubation Date/Time: 11/28/2016 8:14 AM Performed by: Carney Living Pre-anesthesia Checklist: Patient identified, Emergency Drugs available, Suction available, Patient being monitored and Timeout performed Patient Re-evaluated:Patient Re-evaluated prior to inductionOxygen Delivery Method: Circle system utilized Preoxygenation: Pre-oxygenation with 100% oxygen Intubation Type: IV induction Ventilation: Mask ventilation without difficulty and Oral airway inserted - appropriate to patient size Laryngoscope Size: Mac and 4 Grade View: Grade I Tube type: Oral Tube size: 7.5 mm Number of attempts: 1 Airway Equipment and Method: Stylet Placement Confirmation: ETT inserted through vocal cords under direct vision,  positive ETCO2 and breath sounds checked- equal and bilateral Secured at: 22 cm Tube secured with: Tape Dental Injury: Teeth and Oropharynx as per pre-operative assessment

## 2016-11-28 NOTE — Progress Notes (Addendum)
Site area: Left arterial radial sheath was removed by Nelda Severe RRT  Site Prior to Removal:  Level 0  Pressure Applied For 10 MINUTES    Minutes Beginning at 1020am   Manual:   Yes.    Patient Status During Pull:  stable  Post Pull Groin Site:  Level 0  Post Pull Instructions Given:  Yes.    Post Pull Pulses Present:  Yes.    Dressing Applied:  Yes.  Pressure dressing applied  Comments:

## 2016-11-28 NOTE — Progress Notes (Addendum)
Site area: Left groin a 7, and 11 french venous sheath was removed Nelda Severe RTR  Site Prior to Removal:  Level 0  Pressure Applied For 20 MINUTES    Bedrest Beginning at 1220p  Manual:   Yes.    Patient Status During Pull:  stable  Post Pull Groin Site:  Level 0  Post Pull Instructions Given:  Yes.    Post Pull Pulses Present:  Yes.    Dressing Applied:  Yes.    Comments:

## 2016-11-28 NOTE — Anesthesia Postprocedure Evaluation (Signed)
Anesthesia Post Note  Patient: Adrian Lambert  Procedure(s) Performed: Procedure(s) (LRB): Atrial Fibrillation Ablation (N/A)  Patient location during evaluation: PACU Anesthesia Type: General Level of consciousness: awake and alert Pain management: pain level controlled Vital Signs Assessment: post-procedure vital signs reviewed and stable Respiratory status: spontaneous breathing, nonlabored ventilation, respiratory function stable and patient connected to nasal cannula oxygen Cardiovascular status: blood pressure returned to baseline and stable Postop Assessment: no signs of nausea or vomiting Anesthetic complications: no       Last Vitals:  Vitals:   11/28/16 1545 11/28/16 2128  BP: (!) 90/56 (!) 91/59  Pulse: (!) 50 (!) 57  Resp: 15 12  Temp:  36.4 C    Last Pain:  Vitals:   11/28/16 2128  TempSrc: Oral                 Tashima Scarpulla EDWARD

## 2016-11-28 NOTE — Progress Notes (Addendum)
ANTICOAGULATION CONSULT NOTE - Initial Consult  Pharmacy Consult for Coumadin Indication: h/o afib on chronic coumadin pta s/p ablation on 11/28/16  Allergies  Allergen Reactions  . Celecoxib Rash and Other (See Comments)    Celebrex    Patient Measurements: Height: 5\' 9"  (175.3 cm) Weight: 184 lb (83.5 kg) IBW/kg (Calculated) : 70.7 Heparin Dosing Weight: 83.5 kg  Vital Signs: Temp: 98 F (36.7 C) (05/08 1300) Temp Source: Oral (05/08 1300) BP: 87/58 (05/08 1345) Pulse Rate: 55 (05/08 1422)  Labs:  Recent Labs  11/28/16 0644  LABPROT 23.0*  INR 2.00    CrCl cannot be calculated (Patient's most recent lab result is older than the maximum 21 days allowed.).   Medical History: Past Medical History:  Diagnosis Date  . Anticoagulant long-term use   . Arthritis    "probably in my thumbs" (06/26/2012)  . Bifascicular block   . Chronic combined systolic and diastolic CHF (congestive heart failure) ([place])    a. 01/2016 Echo: EF 45-50%, gr2 DD, inflat AK (poor acoustic windows);  b. 07/2016 Echo: EF 25-30%, mild LVH, PASP 63mmHg (pt in Afib).  . Complication of anesthesia    "wake up w/a start; hallucinations" (06/26/2012)  . Critical Aortic Stenosis    a. 03/2003 s/p SJM #25 mech prosthetic AoV;  b. 07/2016 Echo: EF 25-30% (in setting of AF), AoV area 1.72 cm^2 (VTI), 1.75 cm^2 (Vmean).  . Diverticulosis   . Gouty arthritis    "have had it in both feet, ankles, right knee" (06/26/2012)  . History of diverticulitis of colon   . History of kidney stones   . History of pancreatitis   . Hypertension   . Incomplete RBBB   . PAF (paroxysmal atrial fibrillation) ([facility])    a. CHA2DS2VASc = 4-->chronic coumadin in setting of mech AoV;  b. 07/2016 Recurrent AF RVR.    Medications:  Prescriptions Prior to Admission  Medication Sig Dispense Refill Last Dose  . allopurinol (ZYLOPRIM) 300 MG tablet Take 300 mg by mouth daily.    11/27/2016  . amiodarone (PACERONE) 200 MG tablet Take 200  mg by mouth daily.   11/27/2016  . aspirin 81 MG chewable tablet Chew 1 tablet (81 mg total) by mouth daily. 30 tablet 6 11/27/2016  . digoxin (LANOXIN) 0.125 MG tablet Take 1 tablet (0.125 mg total) by mouth daily. 30 tablet 6 11/27/2016  . docusate sodium (COLACE) 100 MG capsule Take 100 mg by mouth daily.   11/27/2016  . losartan (COZAAR) 25 MG tablet Take 1 tablet (25 mg total) by mouth at bedtime. 30 tablet 11 11/27/2016  . metoprolol succinate (TOPROL-XL) 25 MG 24 hr tablet Take 1 tablet (25 mg total) by mouth daily. 30 tablet 6 11/27/2016  . Psyllium (METAMUCIL PO) Take 2 capsules by mouth 2 (two) times daily.    11/27/2016  . spironolactone (ALDACTONE) 25 MG tablet Take 12.5 mg by mouth daily. Take 1/2 tab once daily   11/27/2016  . tamsulosin (FLOMAX) 0.4 MG CAPS capsule Take 0.4 mg by mouth every other day.    11/27/2016  . tolterodine (DETROL LA) 4 MG 24 hr capsule Take 4 mg by mouth daily.   0 11/27/2016  . torsemide (DEMADEX) 20 MG tablet Take 20 mg by mouth daily.   11/27/2016  . warfarin (COUMADIN) 5 MG tablet Take 0.5-1 tablets (2.5-5 mg total) by mouth See admin instructions. Take 5 mg by mouth Monday, Wednesday, Friday and Sunday. Take 2.5 mg by mouth on all other  days. (Patient taking differently: Take 2.5-5 mg by mouth See admin instructions. Takes 5 mg on Tuesday, Thursday, and Saturday and takes 2.5 mg (1/2) tab on Sun, Mon, Wed, and Fri) 45 tablet 6 11/27/2016  . colchicine 0.6 MG tablet Take 0.6 mg by mouth daily as needed (for gout).    More than a month at Unknown time    Assessment: 76 y.o male with a history of atrial fibrillation and systolic heart failure with an EF of 45-50%. He is on coumadin PTA.  He presents to [facility] 11/28/16 for ablation of AF.  Now s/p ablation, pharmacy to manage his coumadin therapy.  He takes Coumadin 5mg  every TTSat and 2.5 mg qMWFSun., last taken yesterday 11/27/16.  INR on admit = 2.0 is therapeutic.  No CBC done today. On 10/18/16 CBC was wnl:  Hgb was 13.3,  Hct was 40.9 and pltc was  172k.   No bleeding or hematoma reported post procedure.    Goal of Therapy:  INR 2-3 Monitor platelets by anticoagulation protocol: Yes   Plan:  Resume his PTA coumadin dose 5mg  every TTSat and 2.5 mg qMWFSun. Protime/INR daily  Thank you for allowing pharmacy to be part of this patients care team. Nicole Cella, [place] Clinical Pharmacist Pager: [phone] 8A-4P [phone] [phone] [employer] [phone] 11/28/2016,3:11 PM

## 2016-11-29 ENCOUNTER — Encounter (HOSPITAL_COMMUNITY): Admission: RE | Admit: 2016-11-29 | Payer: Medicare Other | Source: Ambulatory Visit

## 2016-11-29 DIAGNOSIS — Z79899 Other long term (current) drug therapy: Secondary | ICD-10-CM | POA: Diagnosis not present

## 2016-11-29 DIAGNOSIS — I429 Cardiomyopathy, unspecified: Secondary | ICD-10-CM | POA: Diagnosis not present

## 2016-11-29 DIAGNOSIS — Z952 Presence of prosthetic heart valve: Secondary | ICD-10-CM | POA: Diagnosis not present

## 2016-11-29 DIAGNOSIS — I480 Paroxysmal atrial fibrillation: Secondary | ICD-10-CM | POA: Diagnosis not present

## 2016-11-29 DIAGNOSIS — Z7901 Long term (current) use of anticoagulants: Secondary | ICD-10-CM | POA: Diagnosis not present

## 2016-11-29 DIAGNOSIS — N183 Chronic kidney disease, stage 3 (moderate): Secondary | ICD-10-CM | POA: Diagnosis not present

## 2016-11-29 DIAGNOSIS — I5022 Chronic systolic (congestive) heart failure: Secondary | ICD-10-CM | POA: Diagnosis not present

## 2016-11-29 DIAGNOSIS — I481 Persistent atrial fibrillation: Secondary | ICD-10-CM | POA: Diagnosis not present

## 2016-11-29 DIAGNOSIS — Z7982 Long term (current) use of aspirin: Secondary | ICD-10-CM | POA: Diagnosis not present

## 2016-11-29 DIAGNOSIS — I130 Hypertensive heart and chronic kidney disease with heart failure and stage 1 through stage 4 chronic kidney disease, or unspecified chronic kidney disease: Secondary | ICD-10-CM | POA: Diagnosis not present

## 2016-11-29 DIAGNOSIS — K219 Gastro-esophageal reflux disease without esophagitis: Secondary | ICD-10-CM | POA: Diagnosis not present

## 2016-11-29 LAB — PROTIME-INR
INR: 2.21
Prothrombin Time: 24.9 seconds — ABNORMAL HIGH (ref 11.4–15.2)

## 2016-11-29 NOTE — Discharge Summary (Signed)
ELECTROPHYSIOLOGY PROCEDURE DISCHARGE SUMMARY    Patient ID: Adrian Lambert,  MRN: [id number], DOB/AGE: [birth year] 76 y.o.  Admit date: 11/28/2016 Discharge date: 11/29/2016  Primary Care Physician: Adrian Fess, MD  Primary Cardiologist: Dr. Aundra Lambert Electrophysiologist: Dr. Curt Lambert  Primary Discharge Diagnosis:  1. Paroxysmal Afib     CHA2DS2Vasc is at least 4, on warfarin, monitored and managed by his PMD The patient has an appointment in place for an INR check, he cannot recall the date, believes is next week.      Secondary Discharge Diagnosis:  1. VHD w/mechanical AVR 2. NICM 3. Chronic CHF (systolic) 4. HTN 5. CRI (stageIII)   Procedures This Admission:  1.  Electrophysiology study and radiofrequency catheter ablation on 11/28/16 by Dr Adrian Lambert.   This study demonstrated  CONCLUSIONS: 1. Sinus rhythm upon presentation.   2. Successful electrical isolation and anatomical encircling of all four pulmonary veins with radiofrequency current. 3. No inducible arrhythmias following ablation both on and off of dobutamine 4. No early apparent complications.  Brief HPI: Adrian Lambert is a 76 y.o. male with a history of paroxysmal atrial fibrillation.  He has failed medical therapy with amiodarone. Risks, benefits, and alternatives to catheter ablation of atrial fibrillation were reviewed with the patient who wished to proceed.  The patient underwent cardiac CT prior to the procedure which demonstrated no LAA thrombus.    Hospital Course:  The patient was admitted and underwent EPS/RFCA of atrial fibrillation with details as outlined above.  He was monitored on telemetry overnight which demonstrated SR.  Groin sites were without complication on the day of discharge.  The patient was examined by Dr. Curt Lambert and considered to be stable for discharge.  Wound care and restrictions were reviewed with the patient.    Patient reports his baseline BP generally in 45'X systolic, this  morning I checked is 103/60.  He feels well this morning without complaints.  The patient Adrian Lambert be seen back by Adrian Palau, NP in 4 weeks and Dr Adrian Lambert in 12 weeks for post ablation follow up.    Physical Exam: Vitals:   11/28/16 1422 11/28/16 1545 11/28/16 2128 11/29/16 0339  BP:  (!) 90/56 (!) 91/59 93/60  Pulse: (!) 55 (!) 50 (!) 57 (!) 56  Resp:  15 12 14   Temp:   97.5 F (36.4 C) 98.1 F (36.7 C)  TempSrc:   Oral Oral  SpO2:  98% 100% 95%  Weight:    188 lb 3.2 oz (85.4 kg)  Height:        GEN- The patient is well appearing, alert and oriented x 3 today.   HEENT: normocephalic, atraumatic; sclera clear, conjunctiva pink; hearing intact; oropharynx clear; neck supple  Lungs- CTA b/l, normal work of breathing.  No wheezes, rales, rhonchi Heart- RRR,valve click is appreciated, rubs or gallops  GI- soft, non-tender, non-distended, bowel sounds present  Extremities- no clubbing, cyanosis, or edema; DP/PT/radial pulses 2+ bilaterally, groin sites are without hematoma/bruit MS- no significant deformity or atrophy Skin- warm and dry, no rash or lesion Psych- euthymic mood, full affect Neuro- strength and sensation are intact   Labs:   Lab Results  Component Value Date   WBC 10.6 (H) 10/18/2016   HGB 13.3 10/18/2016   HCT 40.9 10/18/2016   MCV 58.6 (L) 10/18/2016   PLT 172 10/18/2016   No results for input(s): NA, K, CL, CO2, BUN, CREATININE, CALCIUM, PROT, BILITOT, ALKPHOS, ALT, AST, GLUCOSE in the last 168 hours.  Invalid input(s): LABALBU   Discharge Medications:  Allergies as of 11/29/2016      Reactions   Celecoxib Rash, Other (See Comments)   Celebrex      Medication List    TAKE these medications   allopurinol 300 MG tablet Commonly known as:  ZYLOPRIM Take 300 mg by mouth daily.   amiodarone 200 MG tablet Commonly known as:  PACERONE Take 200 mg by mouth daily.   aspirin 81 MG chewable tablet Chew 1 tablet (81 mg total) by mouth daily.     colchicine 0.6 MG tablet Take 0.6 mg by mouth daily as needed (for gout).   digoxin 0.125 MG tablet Commonly known as:  LANOXIN Take 1 tablet (0.125 mg total) by mouth daily.   docusate sodium 100 MG capsule Commonly known as:  COLACE Take 100 mg by mouth daily.   losartan 25 MG tablet Commonly known as:  COZAAR Take 1 tablet (25 mg total) by mouth at bedtime.   METAMUCIL PO Take 2 capsules by mouth 2 (two) times daily.   metoprolol succinate 25 MG 24 hr tablet Commonly known as:  TOPROL-XL Take 1 tablet (25 mg total) by mouth daily.   spironolactone 25 MG tablet Commonly known as:  ALDACTONE Take 12.5 mg by mouth daily. Take 1/2 tab once daily   tamsulosin 0.4 MG Caps capsule Commonly known as:  FLOMAX Take 0.4 mg by mouth every other day.   tolterodine 4 MG 24 hr capsule Commonly known as:  DETROL LA Take 4 mg by mouth daily.   torsemide 20 MG tablet Commonly known as:  DEMADEX Take 20 mg by mouth daily.   warfarin 5 MG tablet Commonly known as:  COUMADIN Take 0.5-1 tablets (2.5-5 mg total) by mouth See admin instructions. Take 5 mg by mouth Monday, Wednesday, Friday and Sunday. Take 2.5 mg by mouth on all other days. What changed:  additional instructions       Disposition:  Home Discharge Instructions    Diet - low sodium heart healthy    Complete by:  As directed    Increase activity slowly    Complete by:  As directed      Follow-up Information    Adrian Fess, MD Follow up.   Specialty:  Family Medicine Why:  Please call and confirm your next lab draw/coumadin check, to have done within the next week Contact information: [place] [address] [phone]        [facility] Follow up on 12/26/2016.   Specialty:  Cardiology Why:  8:30AM Contact information: [address] [id number] [place] [address] [phone]       Constance Haw, MD Follow up on  02/28/2017.   Specialty:  Cardiology Why:  1:30PM Contact information: [address] [place] [address] [phone]        [place], Peter M, MD Follow up on 01/10/2017.   Specialty:  Cardiology Why:  3:40PM Contact information: [address] [place] [address] [place] Follow up on 12/05/2016.   Specialty:  Cardiology Why:  10:00AM Contact information: [address] [id number] [place] [phone]          Duration of Discharge Encounter: Greater than 30 minutes including physician time.  SignedTommye Standard, PA-C 11/29/2016 9:25 AM  I have seen and examined this patient  with Tommye Standard.  Agree with above, note added to reflect my findings.  On exam, RRR, no murmurs, lungs clear. Had ablation for atrial fibrillation. Procedure tolerated well. In sinus rhythm overnight. Plan for discharge with follow up in AF clinic.    Serina Nichter M. Adar Rase MD 12/04/2016 8:42 AM

## 2016-12-01 ENCOUNTER — Encounter (HOSPITAL_COMMUNITY): Admission: RE | Admit: 2016-12-01 | Payer: Medicare Other | Source: Ambulatory Visit

## 2016-12-04 ENCOUNTER — Encounter (HOSPITAL_COMMUNITY)
Admission: RE | Admit: 2016-12-04 | Discharge: 2016-12-04 | Disposition: A | Discharge status: Home / Self Care | Payer: Medicare Other | Source: Ambulatory Visit | Attending: Cardiology | Admitting: Cardiology

## 2016-12-04 DIAGNOSIS — I5022 Chronic systolic (congestive) heart failure: Secondary | ICD-10-CM

## 2016-12-05 ENCOUNTER — Ambulatory Visit (HOSPITAL_COMMUNITY)
Admission: RE | Admit: 2016-12-05 | Discharge: 2016-12-05 | Disposition: A | Discharge status: Home / Self Care | Payer: Medicare Other | Source: Ambulatory Visit | Attending: Family Medicine | Admitting: Family Medicine

## 2016-12-05 ENCOUNTER — Ambulatory Visit (HOSPITAL_BASED_OUTPATIENT_CLINIC_OR_DEPARTMENT_OTHER)
Admission: RE | Admit: 2016-12-05 | Discharge: 2016-12-05 | Disposition: A | Discharge status: Home / Self Care | Payer: Medicare Other | Source: Ambulatory Visit | Attending: Cardiology | Admitting: Cardiology

## 2016-12-05 ENCOUNTER — Encounter (HOSPITAL_COMMUNITY): Payer: Self-pay

## 2016-12-05 VITALS — BP 111/62 | HR 64 | Wt 188.2 lb

## 2016-12-05 DIAGNOSIS — I444 Left anterior fascicular block: Secondary | ICD-10-CM | POA: Diagnosis not present

## 2016-12-05 DIAGNOSIS — Z952 Presence of prosthetic heart valve: Secondary | ICD-10-CM | POA: Insufficient documentation

## 2016-12-05 DIAGNOSIS — I4891 Unspecified atrial fibrillation: Secondary | ICD-10-CM | POA: Diagnosis not present

## 2016-12-05 DIAGNOSIS — I517 Cardiomegaly: Secondary | ICD-10-CM | POA: Insufficient documentation

## 2016-12-05 DIAGNOSIS — I5042 Chronic combined systolic (congestive) and diastolic (congestive) heart failure: Secondary | ICD-10-CM | POA: Diagnosis not present

## 2016-12-05 DIAGNOSIS — I452 Bifascicular block: Secondary | ICD-10-CM | POA: Diagnosis not present

## 2016-12-05 DIAGNOSIS — I4510 Unspecified right bundle-branch block: Secondary | ICD-10-CM | POA: Diagnosis not present

## 2016-12-05 DIAGNOSIS — I5022 Chronic systolic (congestive) heart failure: Secondary | ICD-10-CM | POA: Diagnosis not present

## 2016-12-05 DIAGNOSIS — I77810 Thoracic aortic ectasia: Secondary | ICD-10-CM | POA: Diagnosis not present

## 2016-12-05 DIAGNOSIS — I348 Other nonrheumatic mitral valve disorders: Secondary | ICD-10-CM | POA: Insufficient documentation

## 2016-12-05 LAB — COMPREHENSIVE METABOLIC PANEL
ALK PHOS: 118 U/L (ref 38–126)
ALT: 39 U/L (ref 17–63)
AST: 52 U/L — ABNORMAL HIGH (ref 15–41)
Albumin: 4 g/dL (ref 3.5–5.0)
Anion gap: 8 (ref 5–15)
BILIRUBIN TOTAL: 1.6 mg/dL — AB (ref 0.3–1.2)
BUN: 21 mg/dL — ABNORMAL HIGH (ref 6–20)
CALCIUM: 9.3 mg/dL (ref 8.9–10.3)
CO2: 29 mmol/L (ref 22–32)
CREATININE: 1.16 mg/dL (ref 0.61–1.24)
Chloride: 101 mmol/L (ref 101–111)
GFR, EST NON AFRICAN AMERICAN: 60 mL/min — AB (ref >60)
Glucose, Bld: 102 mg/dL — ABNORMAL HIGH (ref 65–99)
Potassium: 4.2 mmol/L (ref 3.5–5.1)
SODIUM: 138 mmol/L (ref 135–145)
Total Protein: 6.6 g/dL (ref 6.5–8.1)

## 2016-12-05 LAB — PROTIME-INR
INR: 1.87
Prothrombin Time: 21.8 seconds — ABNORMAL HIGH (ref 11.4–15.2)

## 2016-12-05 LAB — TSH: TSH: 3.21 u[IU]/mL (ref 0.350–4.500)

## 2016-12-05 LAB — DIGOXIN LEVEL: DIGOXIN LVL: 0.9 ng/mL (ref 0.8–2.0)

## 2016-12-05 NOTE — Progress Notes (Signed)
  Echocardiogram 2D Echocardiogram has been performed.  Adrian Lambert 12/05/2016, 9:49 AM

## 2016-12-05 NOTE — Patient Instructions (Signed)
Labs today  We will contact you in 3 months to schedule your next appointment.  

## 2016-12-06 ENCOUNTER — Encounter (HOSPITAL_COMMUNITY): Payer: Medicare Other

## 2016-12-06 ENCOUNTER — Other Ambulatory Visit: Payer: Self-pay | Admitting: Family Medicine

## 2016-12-06 DIAGNOSIS — Z7901 Long term (current) use of anticoagulants: Secondary | ICD-10-CM | POA: Diagnosis not present

## 2016-12-06 DIAGNOSIS — R748 Abnormal levels of other serum enzymes: Secondary | ICD-10-CM | POA: Diagnosis not present

## 2016-12-06 DIAGNOSIS — I480 Paroxysmal atrial fibrillation: Secondary | ICD-10-CM | POA: Diagnosis not present

## 2016-12-06 DIAGNOSIS — I10 Essential (primary) hypertension: Secondary | ICD-10-CM | POA: Diagnosis not present

## 2016-12-06 NOTE — Progress Notes (Signed)
PCP: Dr Rex Kras - Also manages [facility] Cardiology: Dr. Martinique  HF Cardiology: Dr Aundra Dubin   HPI: Adrian Lambert is a 76 year old male with a history of severe AS s/p St Jude mechanical AVR in 03/2003 on chronic coumadin, HTN, obesity, and mild LV dysfxn EF 40-45% on 01/2016 echo. Prior to admit in 1/18, he had a cough and dyspnea that was thought to be bronchitis so he was treated with abx and oral steroids. He was seen in cardiology office in 1/18 and was noted to be in rapid atrial fibrillation with significant volume overload.   Admitted 1/23 through 08/29/16 with marked volume overload, lower extremity edema, and atrial fibrillation with RVR.  Initial cardioversion failed and he was difficult to diurese. He was placed on milrinone for low output and diuresed with IV lasix with good response.  Weight came down considerably. Amiodarone was begun. Once fully diuresed, he had successful DC-CV on 08/28/16. Discharged on torsemide 60 mg daily. Discharge weight 212 pounds. Echo and TEE in 1/18 had shown fall in EF to around 25%.   He continued to lose weight as an outpatient.  He had a syncopal episode where he stood up from sitting and got lightheaded with transient loss of consciousness.  He had been having orthostatic symptoms.  We saw him in the office and cut back on torsemide from 60 mg daily down eventually to 20 mg daily.  Creatinine up some to 1.7 at that time.    At a prior appt, he was back in atrial fibrillation.  He was set up for DCCV in 3/18, but was in NSR when he arrived to short stay. He had atrial fibrillation ablation with Dr. Curt Bears in 5/18.   Echo was done today and reviewed, EF up to 45% with mild diffuse hypokinesis, normal mechanical aortic valve, mildly decreased RV systolic function.    He is doing well currently.  No palpitations.  He is in NSR today.  He is short of breath with long walks, mild dyspnea with flight of stairs.  Weight is down another 8 lbs.  He has started cardiac rehab.  Lightheaded only if he stands up fast from lying down.  Generally ok when he changes positions gradually.  No further syncope.  No chest pain.  No BRBPR/melena.  No orthopnea/PND.   Labs (09/04/2016):  Hgb 15.9, K 5.0, Creatinine 1.77, digoxin 0.7, BNP 326  Labs (3/18): digoxin 0.8, K 4.8, creatinine 1.44, TSH normal, AST 54, ALT 49 Labs (4/18): K 4.3, creatinine 1.3  ECG (personally reviewed): NSR, RBBB, LAFB.   PMH: 1. Aortic stenosis: # 25 St Jude mechanical aortic valve in 9/14.   2. Chronic systolic CHF: Echo in 2/58 with EF 45-50%.  Admitted in 1/18 with afib with RVR, ?tachycardia-mediated cardiomyopathy.  - Echo (1/18) with EF 25-30%, stable mechanical aortic valve.  - TEE (1/18) with EF 20-25%, stable mechanical aortic valve, moderate Adrian, small PFO.  - Admission 1/18 with acute on chronic systolic CHF, required milrinone, extensively diuresed.  - Echo (5/18) with EF 45%, diffuse hypokinesis, normal RV size with mildly decreased systolic function, mechanical aortic valve with mean gradient 14 mmHg, ascending aorta 4.4 cm.  3. Atrial fibrillation: First noted in 5/27, uncertain how long it had been present (with RVR).  08/28/2016 Successful DC-CV --> NSR.  Back in atrial fibrillation at 09/18/16 office visit.  Set up for DCCV in 3/18 but back in NSR.   - Atrial fibrillation ablation in 5/18.  4. 09/04/2016  Syncope--> over-diuresis most likely.  5. HTN 6. Ascending aortic aneurysm: 4.4 cm on 7/17 echo, 4.4 cm on 5/18 echo.  7. Prostate cancer: Treated with radioactive seed implantation.  8. Nephrolithiasis.  9. PFTs (4/18): Mild restriction.   ROS: All systems negative except as listed in HPI, PMH and Problem List.  SH:  Social History   Social History  . Marital status: Married    Spouse name: N/A  . Number of children: 4  . 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 Smoking status: Never Smoker  . Smokeless  tobacco: Never Used  . Alcohol use 1.8 oz/week    3 Cans of beer per week     Comment:  "may have 2-3 beers on the weekend"  . Drug use: No  . Sexual activity: Not on file   Other Topics Concern  . Not on file   Social History Narrative  . No narrative on file    FH:  Family History  Problem Relation Age of Onset  . Heart failure Mother   . Diabetes Mother   . Prostate cancer Father     Current Outpatient Prescriptions  Medication Sig Dispense Refill  . allopurinol (ZYLOPRIM) 300 MG tablet Take 300 mg by mouth daily.     Marland Kitchen amiodarone (PACERONE) 200 MG tablet Take 200 mg by mouth daily.    Marland Kitchen aspirin 81 MG chewable tablet Chew 1 tablet (81 mg total) by mouth daily. 30 tablet 6  . colchicine 0.6 MG tablet Take 0.6 mg by mouth daily as needed (for gout).     Marland Kitchen digoxin (LANOXIN) 0.125 MG tablet Take 1 tablet (0.125 mg total) by mouth daily. 30 tablet 6  . docusate sodium (COLACE) 100 MG capsule Take 100 mg by mouth daily.    Marland Kitchen losartan (COZAAR) 25 MG tablet Take 1 tablet (25 mg total) by mouth at bedtime. 30 tablet 11  . metoprolol succinate (TOPROL-XL) 25 MG 24 hr tablet Take 1 tablet (25 mg total) by mouth daily. 30 tablet 6  . Psyllium (METAMUCIL PO) Take 2 capsules by mouth 2 (two) times daily.     Marland Kitchen spironolactone (ALDACTONE) 25 MG tablet Take 12.5 mg by mouth daily. Take 1/2 tab once daily    . tamsulosin (FLOMAX) 0.4 MG CAPS capsule Take 0.4 mg by mouth every other day.     . tolterodine (DETROL LA) 4 MG 24 hr capsule Take 4 mg by mouth daily.   0  . torsemide (DEMADEX) 20 MG tablet Take 20 mg by mouth daily.    Marland Kitchen warfarin (COUMADIN) 5 MG tablet Take 0.5-1 tablets (2.5-5 mg total) by mouth See admin instructions. Take 5 mg by mouth Monday, Wednesday, Friday and Sunday. Take 2.5 mg by mouth on all other days. (Patient taking differently: Take 2.5-5 mg by mouth See admin instructions. Takes 5 mg on Tuesday, Thursday, and Saturday and takes 2.5 mg (1/2) tab on Sun, Mon, Wed, and  Fri) 45 tablet 6   No current facility-administered medications for this encounter.     Vitals:   12/05/16 0958  BP: 111/62  Pulse: 64  SpO2: 96%  Weight: 188 lb 4 oz (85.4 kg)   Filed Weights   12/05/16 0958  Weight: 188 lb 4 oz (85.4 kg)   PHYSICAL EXAM: General:  NAD  HEENT: normal Neck: supple. JVP not elevated. Carotids 2+ bilaterally; no bruits. No lymphadenopathy  or thryomegaly appreciated. Cor: PMI normal. Regular. S1/S2, mechanical S2, no S3/S4. 1/6 SEM RUSB.  Lungs: CTAB Abdomen: soft, nontender, nondistended. No hepatosplenomegaly. No bruits or masses. Good bowel sounds. Extremities: no cyanosis, clubbing, rash.  Trace ankle edema.  Neuro: alert & orientedx3, cranial nerves grossly intact. Moves all 4 extremities w/o difficulty. Affect pleasant   ASSESSMENT & PLAN: 1. Chronic systolic CHF: EF on TEE in 1/18 20-25%, mechanical aortic valve ok.  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  He required milrinone to assist diuresis during 1/18-2/18 admission.  Now that he is back in NSR s/p atrial fibrillation ablation, echo today shows EF back up to 45% (personally reviewed). Currently, NYHA class II symptoms and does not appear volume overloaded.  Medication titration has been limited by lightheadedness, he is doing ok on current regimen.  - Continue current dose to torsemide, 20 mg daily.  Needs BMET today.  - Continue digoxin 0.125 daily, check level today.    - Continue spironolactone 12.5 daily.   - Can continue Toprol XL and losartan.  - Given no ACS/chest pain and improvement in EF back to his baseline (45%), I think we can defer coronary angiography.  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   - Continue cardiac rehab.   2. Mechanical aortic valve: Continue warfarin and ASA 81. Valve looked ok on 5/18 echo. 3. Atrial fibrillation: Admitted 1/18 with atrial fibrillation with RVR  and CHF.  EF down to 20-25%.  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  Need to keep him in NSR.  DCCV during 2/18 admission and now s/p atrial fibrillation ablation.  He is in NSR today.  - Continue amiodarone 200 daily for about 3 months post-ablation, then can hopefully stop.  Will need to follow LFTs/TSH, check today (AST was mildly elevated when last checked).  Will need regular eye exam.  Baseline PFTs done.  4. CKD: Stage III.  BMET today.  5. Syncope: Suspect prior episode of syncope was due to over-diuresis (orthostatic).  6. Ascending aortic aneurysm: 4.4 cm on 5/18 echo.  Will get MRA chest to follow in 5/19.   Followup in 3 months.   [name] 12/06/2016

## 2016-12-08 ENCOUNTER — Encounter (HOSPITAL_COMMUNITY): Payer: Medicare Other

## 2016-12-08 ENCOUNTER — Telehealth (HOSPITAL_COMMUNITY): Payer: Self-pay | Admitting: Cardiac Rehabilitation

## 2016-12-08 NOTE — Telephone Encounter (Signed)
-----   Message from Will Meredith Leeds, MD sent at 12/08/2016 11:46 AM EDT ----- Regarding: RE: cardiac rehab  That should be just fine. Patients usually need a week post ablation until normal activities.  Thanks! ----- Message ----- From: Lowell Guitar, RN Sent: 12/08/2016  10:06 AM To: Constance Haw, MD Subject: cardiac rehab                                  Dear Dr. Curt Bears,  Pt s/p afib ablation 11/28/16.  Is it ok for him to return to cardiac rehab?  Thank you, Andi Hence, RN, BSN Cardiac Pulmonary Rehab

## 2016-12-11 ENCOUNTER — Encounter (HOSPITAL_COMMUNITY)
Admission: RE | Admit: 2016-12-11 | Discharge: 2016-12-11 | Disposition: A | Discharge status: Home / Self Care | Payer: Medicare Other | Source: Ambulatory Visit | Attending: Cardiology | Admitting: Cardiology

## 2016-12-11 DIAGNOSIS — I5022 Chronic systolic (congestive) heart failure: Secondary | ICD-10-CM | POA: Diagnosis not present

## 2016-12-11 NOTE — Progress Notes (Signed)
Reviewed home exercise with pt today.  Pt plans to walk on treadmill (2, 15 minute bouts) for exercise 1-2x/week in addition to coming to cardiac rehab.  Reviewed THR, pulse, RPE, sign and symptoms, and when to call 911 or MD.  Also discussed weather considerations and indoor options.  Pt voiced understanding.    Adrian Lambert [employer]

## 2016-12-13 ENCOUNTER — Encounter (HOSPITAL_COMMUNITY)
Admission: RE | Admit: 2016-12-13 | Discharge: 2016-12-13 | Disposition: A | Discharge status: Home / Self Care | Payer: Medicare Other | Source: Ambulatory Visit | Attending: Cardiology | Admitting: Cardiology

## 2016-12-13 DIAGNOSIS — I5022 Chronic systolic (congestive) heart failure: Secondary | ICD-10-CM | POA: Diagnosis not present

## 2016-12-13 NOTE — Progress Notes (Signed)
Cardiac Individual Treatment Plan  Patient Details  Name: Adrian Lambert MRN: [id number] Date of Birth: 21-Aug-1940 Referring Provider:     CARDIAC REHAB PHASE II ORIENTATION from 11/16/2016 in [place]  Referring Provider  [name] MD      Initial Encounter Date:    CARDIAC REHAB PHASE II ORIENTATION from 11/16/2016 in [place]  Date  11/16/16  Referring Provider  [name] MD      Visit Diagnosis: Heart failure, chronic systolic ([place])  Patient's Home Medications on Admission:  Current Outpatient Prescriptions:  .  allopurinol (ZYLOPRIM) 300 MG tablet, Take 300 mg by mouth daily. , Disp: , Rfl:  .  amiodarone (PACERONE) 200 MG tablet, Take 200 mg by mouth daily., Disp: , Rfl:  .  aspirin 81 MG chewable tablet, Chew 1 tablet (81 mg total) by mouth daily., Disp: 30 tablet, Rfl: 6 .  colchicine 0.6 MG tablet, Take 0.6 mg by mouth daily as needed (for gout). , Disp: , Rfl:  .  digoxin (LANOXIN) 0.125 MG tablet, Take 1 tablet (0.125 mg total) by mouth daily., Disp: 30 tablet, Rfl: 6 .  docusate sodium (COLACE) 100 MG capsule, Take 100 mg by mouth daily., Disp: , Rfl:  .  losartan (COZAAR) 25 MG tablet, Take 1 tablet (25 mg total) by mouth at bedtime., Disp: 30 tablet, Rfl: 11 .  metoprolol succinate (TOPROL-XL) 25 MG 24 hr tablet, Take 1 tablet (25 mg total) by mouth daily., Disp: 30 tablet, Rfl: 6 .  Psyllium (METAMUCIL PO), Take 2 capsules by mouth 2 (two) times daily. , Disp: , Rfl:  .  spironolactone (ALDACTONE) 25 MG tablet, Take 12.5 mg by mouth daily. Take 1/2 tab once daily, Disp: , Rfl:  .  tamsulosin (FLOMAX) 0.4 MG CAPS capsule, Take 0.4 mg by mouth every other day. , Disp: , Rfl:  .  tolterodine (DETROL LA) 4 MG 24 hr capsule, Take 4 mg by mouth daily. , Disp: , Rfl: 0 .  torsemide (DEMADEX) 20 MG tablet, Take 20 mg by mouth daily., Disp: , Rfl:  .  warfarin (COUMADIN) 5 MG tablet, Take 0.5-1  tablets (2.5-5 mg total) by mouth See admin instructions. Take 5 mg by mouth Monday, Wednesday, Friday and Sunday. Take 2.5 mg by mouth on all other days. (Patient taking differently: Take 2.5-5 mg by mouth See admin instructions. Takes 5 mg on Tuesday, Thursday, and Saturday and takes 2.5 mg (1/2) tab on Sun, Mon, Wed, and Fri), Disp: 45 tablet, Rfl: 6  Past Medical History: Past Medical History:  Diagnosis Date  . Anticoagulant long-term use   . Arthritis    "probably in my thumbs" (06/26/2012)  . Bifascicular block   . Chronic combined systolic and diastolic CHF (congestive heart failure) ([place])    a. 01/2016 Echo: EF 45-50%, gr2 DD, inflat AK (poor acoustic windows);  b. 07/2016 Echo: EF 25-30%, mild LVH, PASP 54mmHg (pt in Afib).  . Complication of anesthesia    "wake up w/a start; hallucinations" (06/26/2012)  . Critical Aortic Stenosis    a. 03/2003 s/p SJM #25 mech prosthetic AoV;  b. 07/2016 Echo: EF 25-30% (in setting of AF), AoV area 1.72 cm^2 (VTI), 1.75 cm^2 (Vmean).  . Diverticulosis   . Gouty arthritis    "have had it in both feet, ankles, right knee" (06/26/2012)  . History of diverticulitis of colon   . History of kidney stones   . History of  pancreatitis   . Hypertension   . Incomplete RBBB   . PAF (paroxysmal atrial fibrillation) ([facility])    a. CHA2DS2VASc = 4-->chronic coumadin in setting of mech AoV;  b. 07/2016 Recurrent AF RVR.    Tobacco Use: History  Smoking Status  . Never Smoker  Smokeless Tobacco  . Never Used    Labs: Recent Review Flowsheet Data    Labs for ITP Cardiac and Pulmonary Rehab Latest Ref Rng & Units 08/25/2016 08/26/2016 08/27/2016 08/28/2016 08/29/2016   TCO2 0 - 100 mmol/L - - - - -   O2SAT % 75.7 60.7 57.9 53.0 71.6      Capillary Blood Glucose: Lab Results  Component Value Date   GLUCAP 123 (H) 01/09/2013     Exercise Target Goals:    Exercise Program Goal: Individual exercise prescription set with THRR, safety & activity barriers.  Participant demonstrates ability to understand and report RPE using BORG scale, to self-measure pulse accurately, and to acknowledge the importance of the exercise prescription.  Exercise Prescription Goal: Starting with aerobic activity 30 plus minutes a day, 3 days per week for initial exercise prescription. Provide home exercise prescription and guidelines that participant acknowledges understanding prior to discharge.  Activity Barriers & Risk Stratification:     Activity Barriers & Cardiac Risk Stratification - 11/16/16 0810      Activity Barriers & Cardiac Risk Stratification   Activity Barriers Deconditioning;Muscular Weakness;Balance Concerns;History of Falls   Cardiac Risk Stratification High      6 Minute Walk:     6 Minute Walk    Row Name 11/16/16 1327         6 Minute Walk   Phase Initial     Distance 1046 feet     Walk Time 6 minutes     # of Rest Breaks 0     MPH 1.98     METS 1.75     RPE 12     VO2 Peak 6.13     Symptoms No     Resting HR 70 bpm     Resting BP 114/70     Max Ex. HR 77 bpm     Max Ex. BP 110/62     2 Minute Post BP 108/64        Oxygen Initial Assessment:   Oxygen Re-Evaluation:   Oxygen Discharge (Final Oxygen Re-Evaluation):   Initial Exercise Prescription:     Initial Exercise Prescription - 11/16/16 1300      Date of Initial Exercise RX and Referring Provider   Date 11/16/16   Referring Provider [name] MD     Treadmill   MPH 1.7   Grade 0   Minutes 10   METs 2.3     Recumbant Bike   Level 1.5   Minutes 10   METs 1     NuStep   Level 2   SPM 70   Minutes 10   METs 2     Prescription Details   Frequency (times per week) 3   Duration Progress to 30 minutes of continuous aerobic without signs/symptoms of physical distress     Intensity   THRR 40-80% of Max Heartrate 58-116   Ratings of Perceived Exertion 11-13   Perceived Dyspnea 0-4     Progression   Progression Continue to progress  workloads to maintain intensity without signs/symptoms of physical distress.     Resistance Training   Training Prescription Yes   Weight 2lbs   Reps 10-15  Perform Capillary Blood Glucose checks as needed.  Exercise Prescription Changes:     Exercise Prescription Changes    Row Name 11/27/16 1000 12/13/16 1000           Response to Exercise   Blood Pressure (Admit) 94/60 95/60      Blood Pressure (Exercise) 126/64 118/70      Blood Pressure (Exit) 102/62 102/62      Heart Rate (Admit) 70 bpm 83 bpm      Heart Rate (Exercise) 87 bpm 100 bpm      Heart Rate (Exit) 67 bpm 76 bpm      Rating of Perceived Exertion (Exercise) 13 12      Symptoms none none      Comments pt was oriented to exercise equipment on 11/20/16. Pt responded well to first week of cardiac rehab pt was oriented to exercise equipment on 11/20/16. Pt responded well to first week of cardiac rehab      Duration Continue with 30 min of aerobic exercise without signs/symptoms of physical distress. Continue with 30 min of aerobic exercise without signs/symptoms of physical distress.      Intensity THRR unchanged THRR unchanged        Progression   Progression Continue to progress workloads to maintain intensity without signs/symptoms of physical distress. Continue to progress workloads to maintain intensity without signs/symptoms of physical distress.      Average METs 2.9 3        Resistance Training   Training Prescription Yes Yes      Weight 3lbs 3lbs      Reps 10-15 10-15      Time 10 Minutes 10 Minutes        Treadmill   MPH 2 2.1      Grade 0 1      Minutes 10 10      METs 2.53 2.9        Recumbant Bike   Level 2 2.5      Minutes 10 10      METs 2.8 2.8        NuStep   Level 3 3      SPM 80 80      Minutes 10 10      METs 3.5 3.2        Home Exercise Plan   Plans to continue exercise at  - Home (comment)  on treadmill 2(15' bouts)      Frequency  - Add 2 additional days to program  exercise sessions.      Initial Home Exercises Provided  - 12/11/16         Exercise Comments:     Exercise Comments    Row Name 12/13/16 1033           Exercise Comments Reviewed METs and goals. Pt is tolerating exercise well; will continue to monitor pt's exercise progression          Exercise Goals and Review:     Exercise Goals    Row Name 11/16/16 0810             Exercise Goals   Increase Physical Activity Yes       Intervention Provide advice, education, support and counseling about physical activity/exercise needs.;Develop an individualized exercise prescription for aerobic and resistive training based on initial evaluation findings, risk stratification, comorbidities and participant's personal goals.       Expected Outcomes Achievement of increased cardiorespiratory fitness and enhanced flexibility, muscular endurance  and strength shown through measurements of functional capacity and personal statement of participant.       Increase Strength and Stamina Yes       Intervention Provide advice, education, support and counseling about physical activity/exercise needs.;Develop an individualized exercise prescription for aerobic and resistive training based on initial evaluation findings, risk stratification, comorbidities and participant's personal goals.  get back to hiking and fishing. Learn how to exercise safely.       Expected Outcomes Achievement of increased cardiorespiratory fitness and enhanced flexibility, muscular endurance and strength shown through measurements of functional capacity and personal statement of participant.          Exercise Goals Re-Evaluation :     Exercise Goals Re-Evaluation    Row Name 12/11/16 1627             Exercise Goal Re-Evaluation   Exercise Goals Review Increase Physical Activity;Increase Strenth and Stamina       Comments Reviewed home exercise with pt today.  Pt plans to walk on treadmill (2, 15 minute bouts) for  exercise 1-2x/week in addition to coming to cardiac rehab.  Reviewed THR, pulse, RPE, sign and symptoms, and when to call 911 or MD.  Also discussed weather considerations and indoor options.  Pt voiced understanding.       Expected Outcomes Pt will be compliant with HEP and improve in cardiorespiratory fitness           Discharge Exercise Prescription (Final Exercise Prescription Changes):     Exercise Prescription Changes - 12/13/16 1000      Response to Exercise   Blood Pressure (Admit) 95/60   Blood Pressure (Exercise) 118/70   Blood Pressure (Exit) 102/62   Heart Rate (Admit) 83 bpm   Heart Rate (Exercise) 100 bpm   Heart Rate (Exit) 76 bpm   Rating of Perceived Exertion (Exercise) 12   Symptoms none   Comments pt was oriented to exercise equipment on 11/20/16. Pt responded well to first week of cardiac rehab   Duration Continue with 30 min of aerobic exercise without signs/symptoms of physical distress.   Intensity THRR unchanged     Progression   Progression Continue to progress workloads to maintain intensity without signs/symptoms of physical distress.   Average METs 3     Resistance Training   Training Prescription Yes   Weight 3lbs   Reps 10-15   Time 10 Minutes     Treadmill   MPH 2.1   Grade 1   Minutes 10   METs 2.9     Recumbant Bike   Level 2.5   Minutes 10   METs 2.8     NuStep   Level 3   SPM 80   Minutes 10   METs 3.2     Home Exercise Plan   Plans to continue exercise at Home (comment)  on treadmill 2(15' bouts)   Frequency Add 2 additional days to program exercise sessions.   Initial Home Exercises Provided 12/11/16      Nutrition:  Target Goals: Understanding of nutrition guidelines, daily intake of sodium 1500mg , cholesterol 200mg , calories 30% from fat and 7% or less from saturated fats, daily to have 5 or more servings of fruits and vegetables.  Biometrics:     Pre Biometrics - 11/16/16 1328      Pre Biometrics   Waist  Circumference 42 inches   Hip Circumference 40 inches   Waist to Hip Ratio 1.05 %   Triceps Skinfold 14 mm   %  Body Fat 27.7 %   Grip Strength 35 kg   Flexibility 9.25 in   Single Leg Stand 1.65 seconds       Nutrition Therapy Plan and Nutrition Goals:   Nutrition Discharge: Nutrition Scores:   Nutrition Goals Re-Evaluation:   Nutrition Goals Re-Evaluation:   Nutrition Goals Discharge (Final Nutrition Goals Re-Evaluation):   Psychosocial: Target Goals: Acknowledge presence or absence of significant depression and/or stress, maximize coping skills, provide positive support system. Participant is able to verbalize types and ability to use techniques and skills needed for reducing stress and depression.  Initial Review & Psychosocial Screening:     Initial Psych Review & Screening - 11/16/16 1608      Initial Review   Current issues with None Identified     Family Dynamics   Good Support System? Yes     Barriers   Psychosocial barriers to participate in program There are no identifiable barriers or psychosocial needs.     Screening Interventions   Interventions Encouraged to exercise      Quality of Life Scores:     Quality of Life - 11/24/16 1030      Quality of Life Scores   Health/Function Pre 24.47 %  pt concerned about fatigue, decreased stamina.  pt is concerned symptoms are medication related. pt scheduled for ablation 11/28/2016.  pt has discussed his concerns with Dr. Aundra Dubin.    Socioeconomic Pre 24.64 %   Psych/Spiritual Pre 30 %   Family Pre 26.4 %   GLOBAL Pre 25.93 %  overall scores good. pt encouraged continued CR participation will likely decrease symptoms, in addition to ablation intervention for arrhythmia control.   pt offered emotional support and reassurance.       PHQ-9: Recent Review Flowsheet Data    Depression screen [facility] 2/9 11/20/2016   Decreased Interest 0   Down, Depressed, Hopeless 0   PHQ - 2 Score 0     Interpretation of  Total Score  Total Score Depression Severity:  1-4 = Minimal depression, 5-9 = Mild depression, 10-14 = Moderate depression, 15-19 = Moderately severe depression, 20-27 = Severe depression   Psychosocial Evaluation and Intervention:     Psychosocial Evaluation - 11/20/16 0913      Psychosocial Evaluation & Interventions   Interventions Encouraged to exercise with the program and follow exercise prescription   Comments no psychosocial needs identified, no interventions necessary.    Expected Outcomes pt will demonstrate positive hopeful outlook with good coping skills.    Continue Psychosocial Services  No Follow up required      Psychosocial Re-Evaluation:   Psychosocial Discharge (Final Psychosocial Re-Evaluation):   Vocational Rehabilitation: Provide vocational rehab assistance to qualifying candidates.   Vocational Rehab Evaluation & Intervention:     Vocational Rehab - 11/16/16 1610      Initial Vocational Rehab Evaluation & Intervention   Assessment shows need for Vocational Rehabilitation No      Education: Education Goals: Education classes will be provided on a weekly basis, covering required topics. Participant will state understanding/return demonstration of topics presented.  Learning Barriers/Preferences:     Learning Barriers/Preferences - 11/16/16 4098      Learning Barriers/Preferences   Learning Barriers None   Learning Preferences Written Material;Video;Verbal Instruction;Skilled Demonstration      Education Topics: Count Your Pulse:  -Group instruction provided by verbal instruction, demonstration, patient participation and written materials to support subject.  Instructors address importance of being able to find your pulse and how to  count your pulse when at home without a heart monitor.  Patients get hands on experience counting their pulse with staff help and individually.   CARDIAC REHAB PHASE II EXERCISE from 11/24/2016 in [place]  Date  11/24/16  Instruction Review Code  2- meets goals/outcomes      Heart Attack, Angina, and Risk Factor Modification:  -Group instruction provided by verbal instruction, video, and written materials to support subject.  Instructors address signs and symptoms of angina and heart attacks.    Also discuss risk factors for heart disease and how to make changes to improve heart health risk factors.   Functional Fitness:  -Group instruction provided by verbal instruction, demonstration, patient participation, and written materials to support subject.  Instructors address safety measures for doing things around the house.  Discuss how to get up and down off the floor, how to pick things up properly, how to safely get out of a chair without assistance, and balance training.   Meditation and Mindfulness:  -Group instruction provided by verbal instruction, patient participation, and written materials to support subject.  Instructor addresses importance of mindfulness and meditation practice to help reduce stress and improve awareness.  Instructor also leads participants through a meditation exercise.    Stretching for Flexibility and Mobility:  -Group instruction provided by verbal instruction, patient participation, and written materials to support subject.  Instructors lead participants through series of stretches that are designed to increase flexibility thus improving mobility.  These stretches are additional exercise for major muscle groups that are typically performed during regular warm up and cool down.   Hands Only CPR:  -Group verbal, video, and participation provides a basic overview of AHA guidelines for community CPR. Role-play of emergencies allow participants the opportunity to practice calling for help and chest compression technique with discussion of AED use.   Hypertension: -Group verbal and written instruction that provides a basic overview of  hypertension including the most recent diagnostic guidelines, risk factor reduction with self-care instructions and medication management.    Nutrition I class: Heart Healthy Eating:  -Group instruction provided by PowerPoint slides, verbal discussion, and written materials to support subject matter. The instructor gives an explanation and review of the Therapeutic Lifestyle Changes diet recommendations, which includes a discussion on lipid goals, dietary fat, sodium, fiber, plant stanol/sterol esters, sugar, and the components of a well-balanced, healthy diet.   Nutrition II class: Lifestyle Skills:  -Group instruction provided by PowerPoint slides, verbal discussion, and written materials to support subject matter. The instructor gives an explanation and review of label reading, grocery shopping for heart health, heart healthy recipe modifications, and ways to make healthier choices when eating out.   Diabetes Question & Answer:  -Group instruction provided by PowerPoint slides, verbal discussion, and written materials to support subject matter. The instructor gives an explanation and review of diabetes co-morbidities, pre- and post-prandial blood glucose goals, pre-exercise blood glucose goals, signs, symptoms, and treatment of hypoglycemia and hyperglycemia, and foot care basics.   Diabetes Blitz:  -Group instruction provided by PowerPoint slides, verbal discussion, and written materials to support subject matter. The instructor gives an explanation and review of the physiology behind type 1 and type 2 diabetes, diabetes medications and rational behind using different medications, pre- and post-prandial blood glucose recommendations and Hemoglobin A1c goals, diabetes diet, and exercise including blood glucose guidelines for exercising safely.    Portion Distortion:  -Group instruction provided by PowerPoint slides, verbal discussion, written materials,  and food models to support subject  matter. The instructor gives an explanation of serving size versus portion size, changes in portions sizes over the last 20 years, and what consists of a serving from each food group.   Stress Management:  -Group instruction provided by verbal instruction, video, and written materials to support subject matter.  Instructors review role of stress in heart disease and how to cope with stress positively.     CARDIAC REHAB PHASE II EXERCISE from 11/24/2016 in [place]  Date  11/22/16  Instruction Review Code  2- meets goals/outcomes      Exercising on Your Own:  -Group instruction provided by verbal instruction, power point, and written materials to support subject.  Instructors discuss benefits of exercise, components of exercise, frequency and intensity of exercise, and end points for exercise.  Also discuss use of nitroglycerin and activating EMS.  Review options of places to exercise outside of rehab.  Review guidelines for sex with heart disease.   Cardiac Drugs I:  -Group instruction provided by verbal instruction and written materials to support subject.  Instructor reviews cardiac drug classes: antiplatelets, anticoagulants, beta blockers, and statins.  Instructor discusses reasons, side effects, and lifestyle considerations for each drug class.   Cardiac Drugs II:  -Group instruction provided by verbal instruction and written materials to support subject.  Instructor reviews cardiac drug classes: angiotensin converting enzyme inhibitors (ACE-I), angiotensin II receptor blockers (ARBs), nitrates, and calcium channel blockers.  Instructor discusses reasons, side effects, and lifestyle considerations for each drug class.   Anatomy and Physiology of the Circulatory System:  Group verbal and written instruction and models provide basic cardiac anatomy and physiology, with the coronary electrical and arterial systems. Review of: AMI, Angina, Valve disease, Heart  Failure, Peripheral Artery Disease, Cardiac Arrhythmia, Pacemakers, and the ICD.   Other Education:  -Group or individual verbal, written, or video instructions that support the educational goals of the cardiac rehab program.   Knowledge Questionnaire Score:     Knowledge Questionnaire Score - 11/16/16 1326      Knowledge Questionnaire Score   Pre Score 23/24      Core Components/Risk Factors/Patient Goals at Admission:     Personal Goals and Risk Factors at Admission - 11/16/16 1337      Core Components/Risk Factors/Patient Goals on Admission    Weight Management Yes;Weight Loss;Weight Maintenance   Intervention Weight Management: Develop a combined nutrition and exercise program designed to reach desired caloric intake, while maintaining appropriate intake of nutrient and fiber, sodium and fats, and appropriate energy expenditure required for the weight goal.;Weight Management: Provide education and appropriate resources to help participant work on and attain dietary goals.;Weight Management/Obesity: Establish reasonable short term and long term weight goals.   Expected Outcomes Short Term: Continue to assess and modify interventions until short term weight is achieved;Long Term: Adherence to nutrition and physical activity/exercise program aimed toward attainment of established weight goal;Weight Maintenance: Understanding of the daily nutrition guidelines, which includes 25-35% calories from fat, 7% or less cal from saturated fats, less than 200mg  cholesterol, less than 1.5gm of sodium, & 5 or more servings of fruits and vegetables daily;Weight Loss: Understanding of general recommendations for a balanced deficit meal plan, which promotes 1-2 lb weight loss per week and includes a negative energy balance of [phone] kcal/d;Understanding recommendations for meals to include 15-35% energy as protein, 25-35% energy from fat, 35-60% energy from carbohydrates, less than 200mg  of dietary  cholesterol, 20-35 gm  of total fiber daily;Understanding of distribution of calorie intake throughout the day with the consumption of 4-5 meals/snacks   Hypertension Yes   Intervention Provide education on lifestyle modifcations including regular physical activity/exercise, weight management, moderate sodium restriction and increased consumption of fresh fruit, vegetables, and low fat dairy, alcohol moderation, and smoking cessation.;Monitor prescription use compliance.   Expected Outcomes Short Term: Continued assessment and intervention until BP is < 140/67mm HG in hypertensive participants. < 130/48mm HG in hypertensive participants with diabetes, heart failure or chronic kidney disease.;Long Term: Maintenance of blood pressure at goal levels.   Personal Goal Other Yes   Personal Goal Learn better medication management   Intervention Provide cardiac education classes geared toward medication management and risk factor modification to assist with improving pt's overall quality of life   Expected Outcomes Pt have increased knowledge and understanding of medication and modifiable risk factors      Core Components/Risk Factors/Patient Goals Review:      Goals and Risk Factor Review    Row Name 12/13/16 1641             Core Components/Risk Factors/Patient Goals Review   Personal Goals Review Weight Management/Obesity;Hypertension;Other       Review pt demonstrates eagerness to participate in CR activities.        Expected Outcomes pt will participate in CR exercise, nutrition and lifestyle education opportunities to decrease overall CAD risk factors.           Core Components/Risk Factors/Patient Goals at Discharge (Final Review):      Goals and Risk Factor Review - 12/13/16 1641      Core Components/Risk Factors/Patient Goals Review   Personal Goals Review Weight Management/Obesity;Hypertension;Other   Review pt demonstrates eagerness to participate in CR activities.    Expected  Outcomes pt will participate in CR exercise, nutrition and lifestyle education opportunities to decrease overall CAD risk factors.       ITP Comments:     ITP Comments    Row Name 11/16/16 0809           ITP Comments Medical Director, Dr. Fransico Him          Comments: Pt is making expected progress toward personal goals after completing 6 sessions. Recommend continued exercise and life style modification education including  stress management and relaxation techniques to decrease cardiac risk profile.

## 2016-12-14 ENCOUNTER — Ambulatory Visit
Admission: RE | Admit: 2016-12-14 | Discharge: 2016-12-14 | Disposition: A | Discharge status: Home / Self Care | Payer: Medicare Other | Source: Ambulatory Visit | Attending: Family Medicine | Admitting: Family Medicine

## 2016-12-14 DIAGNOSIS — R7989 Other specified abnormal findings of blood chemistry: Secondary | ICD-10-CM | POA: Diagnosis not present

## 2016-12-14 DIAGNOSIS — R748 Abnormal levels of other serum enzymes: Secondary | ICD-10-CM

## 2016-12-15 ENCOUNTER — Telehealth: Payer: Self-pay | Admitting: Cardiology

## 2016-12-15 ENCOUNTER — Encounter (HOSPITAL_COMMUNITY): Payer: Medicare Other

## 2016-12-15 DIAGNOSIS — I4891 Unspecified atrial fibrillation: Secondary | ICD-10-CM | POA: Diagnosis not present

## 2016-12-15 DIAGNOSIS — Z8546 Personal history of malignant neoplasm of prostate: Secondary | ICD-10-CM | POA: Diagnosis not present

## 2016-12-15 DIAGNOSIS — Z0000 Encounter for general adult medical examination without abnormal findings: Secondary | ICD-10-CM | POA: Diagnosis not present

## 2016-12-15 DIAGNOSIS — Z952 Presence of prosthetic heart valve: Secondary | ICD-10-CM | POA: Diagnosis not present

## 2016-12-15 DIAGNOSIS — I5043 Acute on chronic combined systolic (congestive) and diastolic (congestive) heart failure: Secondary | ICD-10-CM

## 2016-12-15 NOTE — Telephone Encounter (Signed)
Cardiologist [place] Forwarding to [place] office to address as he saw the patient last week.   (Dr. Curt Bears is currently out on vacation and will not return until 6/5)

## 2016-12-15 NOTE — Telephone Encounter (Signed)
°  New Prob  Pt c/o BP issue: STAT if pt c/o blurred vision, one-sided weakness or slurred speech  1. What are your last 5 BP readings?  Most recent:100/68  Orthostatics performed at PCP office today- Lying: 80/60 Sitting: 82/60 Standing: 78/60  2. Are you having any other symptoms (ex. Dizziness, headache, blurred vision, passed out)? Pt states he occasionally gets lightheaded when standing up.  3. What is your BP issue?  Pt states PCP asked him to notify cardiology regarding his blood pressure readings today. States PCP is recommended possibly reducing blood pressure medication or eliminated all together per patient.

## 2016-12-19 MED ORDER — LOSARTAN POTASSIUM 25 MG PO TABS: 12.5000 mg | ORAL_TABLET | Freq: Every day | ORAL | 11 refills | Status: DC

## 2016-12-19 MED ORDER — METOPROLOL SUCCINATE ER 25 MG PO TB24: 12.5000 mg | ORAL_TABLET | Freq: Every day | ORAL | 6 refills | Status: DC

## 2016-12-19 NOTE — Telephone Encounter (Signed)
Pt aware, agreeable and verbalizes understanding 

## 2016-12-19 NOTE — Telephone Encounter (Addendum)
At last OV 12/05/16, BP was 111/62, no meds were added or changed.  Will send to Dr Aundra Dubin for review and recommendation

## 2016-12-19 NOTE — Telephone Encounter (Signed)
Would cut losartan in half to 12.5 mg daily and Toprol XL in half to 12.5 mg daily.  Call in with BP readings in a few days.

## 2016-12-20 ENCOUNTER — Encounter (HOSPITAL_COMMUNITY)
Admission: RE | Admit: 2016-12-20 | Discharge: 2016-12-20 | Disposition: A | Discharge status: Home / Self Care | Payer: Medicare Other | Source: Ambulatory Visit | Attending: Cardiology | Admitting: Cardiology

## 2016-12-20 DIAGNOSIS — I5022 Chronic systolic (congestive) heart failure: Secondary | ICD-10-CM

## 2016-12-22 ENCOUNTER — Encounter (HOSPITAL_COMMUNITY)
Admission: RE | Admit: 2016-12-22 | Discharge: 2016-12-22 | Disposition: A | Discharge status: Home / Self Care | Payer: Medicare Other | Source: Ambulatory Visit | Attending: Cardiology | Admitting: Cardiology

## 2016-12-22 DIAGNOSIS — I5022 Chronic systolic (congestive) heart failure: Secondary | ICD-10-CM | POA: Diagnosis present

## 2016-12-25 ENCOUNTER — Encounter (HOSPITAL_COMMUNITY)
Admission: RE | Admit: 2016-12-25 | Discharge: 2016-12-25 | Disposition: A | Discharge status: Home / Self Care | Payer: Medicare Other | Source: Ambulatory Visit | Attending: Cardiology | Admitting: Cardiology

## 2016-12-25 DIAGNOSIS — I5022 Chronic systolic (congestive) heart failure: Secondary | ICD-10-CM | POA: Diagnosis not present

## 2016-12-26 ENCOUNTER — Other Ambulatory Visit (HOSPITAL_COMMUNITY): Payer: Self-pay | Admitting: *Deleted

## 2016-12-26 ENCOUNTER — Encounter (HOSPITAL_COMMUNITY): Payer: Self-pay | Admitting: Nurse Practitioner

## 2016-12-26 ENCOUNTER — Ambulatory Visit (HOSPITAL_COMMUNITY)
Admission: RE | Admit: 2016-12-26 | Discharge: 2016-12-26 | Disposition: A | Discharge status: Home / Self Care | Payer: Medicare Other | Source: Ambulatory Visit | Attending: Nurse Practitioner | Admitting: Nurse Practitioner

## 2016-12-26 ENCOUNTER — Encounter (INDEPENDENT_AMBULATORY_CARE_PROVIDER_SITE_OTHER): Payer: Self-pay

## 2016-12-26 VITALS — BP 114/64 | HR 74 | Ht 69.0 in | Wt 187.4 lb

## 2016-12-26 DIAGNOSIS — Z888 Allergy status to other drugs, medicaments and biological substances status: Secondary | ICD-10-CM | POA: Diagnosis not present

## 2016-12-26 DIAGNOSIS — Z87442 Personal history of urinary calculi: Secondary | ICD-10-CM | POA: Diagnosis not present

## 2016-12-26 DIAGNOSIS — I110 Hypertensive heart disease with heart failure: Secondary | ICD-10-CM | POA: Insufficient documentation

## 2016-12-26 DIAGNOSIS — Z8249 Family history of ischemic heart disease and other diseases of the circulatory system: Secondary | ICD-10-CM | POA: Insufficient documentation

## 2016-12-26 DIAGNOSIS — I4891 Unspecified atrial fibrillation: Secondary | ICD-10-CM | POA: Diagnosis not present

## 2016-12-26 DIAGNOSIS — Z952 Presence of prosthetic heart valve: Secondary | ICD-10-CM | POA: Diagnosis not present

## 2016-12-26 DIAGNOSIS — Z7982 Long term (current) use of aspirin: Secondary | ICD-10-CM | POA: Diagnosis not present

## 2016-12-26 DIAGNOSIS — I5042 Chronic combined systolic (congestive) and diastolic (congestive) heart failure: Secondary | ICD-10-CM | POA: Diagnosis not present

## 2016-12-26 DIAGNOSIS — I959 Hypotension, unspecified: Secondary | ICD-10-CM | POA: Insufficient documentation

## 2016-12-26 DIAGNOSIS — Z833 Family history of diabetes mellitus: Secondary | ICD-10-CM | POA: Diagnosis not present

## 2016-12-26 DIAGNOSIS — Z7901 Long term (current) use of anticoagulants: Secondary | ICD-10-CM | POA: Diagnosis not present

## 2016-12-26 DIAGNOSIS — M1990 Unspecified osteoarthritis, unspecified site: Secondary | ICD-10-CM | POA: Insufficient documentation

## 2016-12-26 DIAGNOSIS — Z8042 Family history of malignant neoplasm of prostate: Secondary | ICD-10-CM | POA: Diagnosis not present

## 2016-12-26 DIAGNOSIS — Z9889 Other specified postprocedural states: Secondary | ICD-10-CM | POA: Diagnosis not present

## 2016-12-26 DIAGNOSIS — Z79899 Other long term (current) drug therapy: Secondary | ICD-10-CM | POA: Insufficient documentation

## 2016-12-26 MED ORDER — SPIRONOLACTONE 25 MG PO TABS: 12.5000 mg | ORAL_TABLET | ORAL | Status: DC

## 2016-12-26 MED ORDER — TORSEMIDE 20 MG PO TABS: 20.0000 mg | ORAL_TABLET | ORAL | Status: DC

## 2016-12-26 NOTE — Addendum Note (Signed)
Encounter addended by: Sherran Needs, NP on: 12/26/2016  1:43 PM<BR>    Actions taken: Sign clinical note

## 2016-12-26 NOTE — Progress Notes (Addendum)
Primary Care Physician: Hulan Fess, MD Referring Physician: Dr. Curt Bears AHF: Dr.. Beverley Fiedler is a 76 y.o. male with a h/o  afib ablation one month ago as well as chronic combined systolic and diastolic heart failure, as weall as AVR with SJ mechanical valve, on warfarin. He reports that he has not noted any afib since the procedure. No swallowing or rt groin issues. He does report since the ablation that he has had issues with low BP in cardiac rehab to the point that they will not let him exercise until he drinks water to get BP up. Often times he reports sys BP 80-90.  Also received word form PCP that liver enzyme  mildly elevated, ADT 41, questioning effect of amiodarone.   Today, he denies symptoms of palpitations, chest pain, shortness of breath, orthopnea, PND, lower extremity edema, dizziness, presyncope, syncope, or neurologic sequela. The patient is tolerating medications without difficulties and is otherwise without complaint today.   Past Medical History:  Diagnosis Date  . Anticoagulant long-term use   . Arthritis    "probably in my thumbs" (06/26/2012)  . Bifascicular block   . Chronic combined systolic and diastolic CHF (congestive heart failure) ([place])    a. 01/2016 Echo: EF 45-50%, gr2 DD, inflat AK (poor acoustic windows);  b. 07/2016 Echo: EF 25-30%, mild LVH, PASP 81mmHg (pt in Afib).  . Complication of anesthesia    "wake up w/a start; hallucinations" (06/26/2012)  . Critical Aortic Stenosis    a. 03/2003 s/p SJM #25 mech prosthetic AoV;  b. 07/2016 Echo: EF 25-30% (in setting of AF), AoV area 1.72 cm^2 (VTI), 1.75 cm^2 (Vmean).  . Diverticulosis   . Gouty arthritis    "have had it in both feet, ankles, right knee" (06/26/2012)  . History of diverticulitis of colon   . History of kidney stones   . History of pancreatitis   . Hypertension   . Incomplete RBBB   . PAF (paroxysmal atrial fibrillation) (HCC)    a. CHA2DS2VASc = 4-->chronic coumadin in  setting of mech AoV;  b. 07/2016 Recurrent AF RVR.   Past Surgical History:  Procedure Laterality Date  . AORTIC VALVE REPLACEMENT  04-14-2003  DR Servando Snare   #38mm ST JUDE MECHNICAL PROSTHESIS  . ATRIAL FIBRILLATION ABLATION N/A 11/28/2016   Procedure: Atrial Fibrillation Ablation;  Surgeon: Constance Haw, MD;  Location: [place] [facility];  Service: Cardiovascular;  Laterality: N/A;  . CARDIOVERSION N/A 08/18/2016   Procedure: CARDIOVERSION;  Surgeon: Thayer Headings, MD;  Location: [facility];  Service: Cardiovascular;  Laterality: N/A;  . CARDIOVERSION N/A 08/28/2016   Procedure: CARDIOVERSION;  Surgeon: Larey Dresser, MD;  Location: [place];  Service: Cardiovascular;  Laterality: N/A;  . CATARACT EXTRACTION W/ INTRAOCULAR LENS  IMPLANT, BILATERAL  RIGHT 2010/  LEFT DEC 2013  . HEMICOLECTOMY  10/06/2003   Laparoscopic-assisted left hemicolectomy for diverticulitis  . LAPAROSCOPIC CHOLECYSTECTOMY  07-23-2006  . PROSTATE BIOPSY  10/09/2012   gleason 3+4=7  . RADIOACTIVE SEED IMPLANT N/A 01/09/2013   Procedure: RADIOACTIVE SEED IMPLANT;  Surgeon: Franchot Gallo, MD;  Location: [facility];  Service: Urology;  Laterality: N/A;  . TEE WITHOUT CARDIOVERSION N/A 08/18/2016   Procedure: TRANSESOPHAGEAL ECHOCARDIOGRAM (TEE);  Surgeon: Thayer Headings, MD;  Location: [place];  Service: Cardiovascular;  Laterality: N/A;  . TRANSTHORACIC ECHOCARDIOGRAM  04-28-2008   MILD LVH / NORMAL LSF/ NORMAL AORTIC VALVE MECHANICAL PROSTHESIS FUNCTION/ MILD LAE/ EF 55-60%  Current Outpatient Prescriptions  Medication Sig Dispense Refill  . allopurinol (ZYLOPRIM) 300 MG tablet Take 300 mg by mouth daily.     Marland Kitchen amiodarone (PACERONE) 200 MG tablet Take 200 mg by mouth daily.    Marland Kitchen aspirin 81 MG chewable tablet Chew 1 tablet (81 mg total) by mouth daily. 30 tablet 6  . colchicine 0.6 MG tablet Take 0.6 mg by mouth daily as needed (for gout).     Marland Kitchen digoxin (LANOXIN) 0.125 MG  tablet Take 1 tablet (0.125 mg total) by mouth daily. 30 tablet 6  . docusate sodium (COLACE) 100 MG capsule Take 100 mg by mouth daily.    Marland Kitchen losartan (COZAAR) 25 MG tablet Take 0.5 tablets (12.5 mg total) by mouth at bedtime. 30 tablet 11  . metoprolol succinate (TOPROL-XL) 25 MG 24 hr tablet Take 0.5 tablets (12.5 mg total) by mouth daily. 30 tablet 6  . Psyllium (METAMUCIL PO) Take 2 capsules by mouth 2 (two) times daily.     Marland Kitchen spironolactone (ALDACTONE) 25 MG tablet Take 12.5 mg by mouth daily. Take 1/2 tab once daily    . tamsulosin (FLOMAX) 0.4 MG CAPS capsule Take 0.4 mg by mouth every other day.     . tolterodine (DETROL LA) 4 MG 24 hr capsule Take 4 mg by mouth daily.   0  . torsemide (DEMADEX) 20 MG tablet Take 20 mg by mouth daily.    Marland Kitchen warfarin (COUMADIN) 5 MG tablet Take 0.5-1 tablets (2.5-5 mg total) by mouth See admin instructions. Take 5 mg by mouth Monday, Wednesday, Friday and Sunday. Take 2.5 mg by mouth on all other days. (Patient taking differently: Take 2.5-5 mg by mouth See admin instructions. Takes 5 mg on Tuesday, Thursday, and Saturday and takes 2.5 mg (1/2) tab on Sun, Mon, Wed, and Fri) 45 tablet 6   No current facility-administered medications for this encounter.     Allergies  Allergen Reactions  . Celecoxib Rash and Other (See Comments)    Celebrex    Social History   Social History  . Marital status: Married    Spouse name: N/A  . Number of children: 4  . 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 Smoking status: Never Smoker  . Smokeless tobacco: Never Used  . Alcohol use 1.8 oz/week    3 Cans of beer per week     Comment:  "may have 2-3 beers on the weekend"  . Drug use: No  . Sexual activity: Not on file   Other Topics Concern  . Not on file   Social History Narrative  . No narrative on file    Family History  Problem Relation Age of Onset  . Heart failure Mother   .  Diabetes Mother   . Prostate cancer Father     ROS- All systems are reviewed and negative except as per the HPI above  Physical Exam: Vitals:   12/26/16 0829  BP: 114/64  Pulse: 74  Weight: 187 lb 6.4 oz (85 kg)  Height: 5\' 9"  (1.753 m)   Wt Readings from Last 3 Encounters:  12/26/16 187 lb 6.4 oz (85 kg)  12/05/16 188 lb 4 oz (85.4 kg)  11/29/16 188 lb 3.2 oz (85.4 kg)    Labs: Lab Results  Component Value Date   NA 138 12/05/2016   K 4.2 12/05/2016   CL 101 12/05/2016  CO2 29 12/05/2016   GLUCOSE 102 (H) 12/05/2016   BUN 21 (H) 12/05/2016   CREATININE 1.16 12/05/2016   CALCIUM 9.3 12/05/2016   MG 2.2 08/18/2016   Lab Results  Component Value Date   INR 1.87 12/05/2016   No results found for: CHOL, HDL, LDLCALC, TRIG   GEN- The patient is well appearing, alert and oriented x 3 today.   Head- normocephalic, atraumatic Eyes-  Sclera clear, conjunctiva pink Ears- hearing intact Oropharynx- clear Neck- supple, no JVP Lymph- no cervical lymphadenopathy Lungs- Clear to ausculation bilaterally, normal work of breathing Heart- Regular rate and rhythm, no murmurs, rubs or gallops, normal sounding mechanical valve, PMI not laterally displaced GI- soft, NT, ND, + BS Extremities- no clubbing, cyanosis, or edema MS- no significant deformity or atrophy Skin- no rash or lesion Psych- euthymic mood, full affect Neuro- strength and sensation are intact  EKG- NSR at 74 bpm, LAD , pr int 198 ms, qrs 116 ms, qtc 450 ms Epic records reviewed    Assessment and Plan: 1. Afib  S/p ablation and appears to be enjoying SR Continue warfarin for h/o mechanical valve and chadsvasc score of at least 3 Discussed with Dr. Curt Bears and he is ok with stopping amiodarone if he is staying in [place], pt aware  2. AM hypotension Move torsemide to lunch Discussed with Dr. Aundra Dubin as well and he also suggested to move spironolactone to bedtime  3. CHF Weight stable  F/u with Dr. Curt Bears  8/8 and Dr. Aundra Dubin 8/21   Geroge Baseman. Marybell Robards, [facility] [address] [place], [place] [address] [phone]

## 2016-12-27 ENCOUNTER — Encounter (HOSPITAL_COMMUNITY)
Admission: RE | Admit: 2016-12-27 | Discharge: 2016-12-27 | Disposition: A | Discharge status: Home / Self Care | Payer: Medicare Other | Source: Ambulatory Visit | Attending: Cardiology | Admitting: Cardiology

## 2016-12-27 DIAGNOSIS — I5022 Chronic systolic (congestive) heart failure: Secondary | ICD-10-CM

## 2016-12-27 NOTE — Progress Notes (Signed)
Adrian Lambert 76 y.o. male Nutrition Note Spoke with pt. Nutrition Plan and Nutrition Survey goals reviewed with pt. Pt is following Step 2 of the Therapeutic Lifestyle Changes diet. Pt with dx of CHF. Per discussion, pt does not use canned/convenience foods often. Pt eats most fresh/frozen vegetables. Pt reports he has been taking Coumadin "for a while." Pt aware of the need to follow a diet with consistent vitamin K intake. Pt expressed understanding of the information reviewed. Pt aware of nutrition education classes offered.  No results found for: HGBA1C Wt Readings from Last 3 Encounters:  12/26/16 187 lb 6.4 oz (85 kg)  12/05/16 188 lb 4 oz (85.4 kg)  11/29/16 188 lb 3.2 oz (85.4 kg)   Nutrition Diagnosis Food-and nutrition-related knowledge deficit related to lack of exposure to information as related to diagnosis of: ? CVD  Nutrition Intervention ? Pt's individual nutrition plan reviewed with pt. ? Benefits of adopting Therapeutic Lifestyle Changes discussed when Medficts reviewed. ? Pt to attend the Portion Distortion class ? Pt to attend the   ? Nutrition I class                  ? Nutrition II class ?  Continue client-centered nutrition education by RD, as part of interdisciplinary care.  Goal(s) ? Pt to describe the benefit of including fruits, vegetables, whole grains, and low-fat dairy products in a heart healthy meal plan.  Monitor and Evaluate progress toward nutrition goal with team. Derek Mound, M.Ed, RD, LDN, CDE 12/27/2016 9:54 AM

## 2016-12-29 ENCOUNTER — Encounter (HOSPITAL_COMMUNITY)
Admission: RE | Admit: 2016-12-29 | Discharge: 2016-12-29 | Disposition: A | Discharge status: Home / Self Care | Payer: Medicare Other | Source: Ambulatory Visit | Attending: Cardiology | Admitting: Cardiology

## 2016-12-29 DIAGNOSIS — I5022 Chronic systolic (congestive) heart failure: Secondary | ICD-10-CM

## 2017-01-01 ENCOUNTER — Encounter (HOSPITAL_COMMUNITY)
Admission: RE | Admit: 2017-01-01 | Discharge: 2017-01-01 | Disposition: A | Discharge status: Home / Self Care | Payer: Medicare Other | Source: Ambulatory Visit | Attending: Cardiology | Admitting: Cardiology

## 2017-01-01 DIAGNOSIS — I5022 Chronic systolic (congestive) heart failure: Secondary | ICD-10-CM

## 2017-01-03 ENCOUNTER — Encounter (HOSPITAL_COMMUNITY)
Admission: RE | Admit: 2017-01-03 | Discharge: 2017-01-03 | Disposition: A | Discharge status: Home / Self Care | Payer: Medicare Other | Source: Ambulatory Visit | Attending: Cardiology | Admitting: Cardiology

## 2017-01-03 DIAGNOSIS — Z952 Presence of prosthetic heart valve: Secondary | ICD-10-CM | POA: Diagnosis not present

## 2017-01-03 DIAGNOSIS — Z7901 Long term (current) use of anticoagulants: Secondary | ICD-10-CM | POA: Diagnosis not present

## 2017-01-03 DIAGNOSIS — I5022 Chronic systolic (congestive) heart failure: Secondary | ICD-10-CM

## 2017-01-05 ENCOUNTER — Encounter (HOSPITAL_COMMUNITY)
Admission: RE | Admit: 2017-01-05 | Discharge: 2017-01-05 | Disposition: A | Discharge status: Home / Self Care | Payer: Medicare Other | Source: Ambulatory Visit | Attending: Cardiology | Admitting: Cardiology

## 2017-01-05 DIAGNOSIS — I5022 Chronic systolic (congestive) heart failure: Secondary | ICD-10-CM | POA: Diagnosis not present

## 2017-01-08 ENCOUNTER — Encounter (HOSPITAL_COMMUNITY)
Admission: RE | Admit: 2017-01-08 | Discharge: 2017-01-08 | Disposition: A | Discharge status: Home / Self Care | Payer: Medicare Other | Source: Ambulatory Visit | Attending: Cardiology | Admitting: Cardiology

## 2017-01-08 DIAGNOSIS — I5022 Chronic systolic (congestive) heart failure: Secondary | ICD-10-CM

## 2017-01-08 NOTE — Progress Notes (Signed)
Adrian Lambert Date of Birth: 1941-01-29 Medical Record #[id number]  History of Present Illness: Adrian Lambert is seen for followup AVR and CHF. He has a history of severe aortic stenosis and is status post aortic valve replacement with a #25 mm [employer] Jude mechanical prosthesis September 2004.   He is on chronic Coumadin therapy. He has a history of obesity and HTN. Echo in 2017 showed EF 40-45%.  He was admitted 1/23 through 08/29/16 with marked volume overload, lower extremity edema, and atrial fibrillation with RVR.  Initial cardioversion failed and he was difficult to diurese. He was placed on milrinone for low output and diuresed with IV lasix with good response.  Weight came down considerably. Amiodarone was begun. Once fully diuresed, he had successful DC-CV on 08/28/16.  On post hospital appointment, he was back in atrial fibrillation.  He was set up for DCCV in 3/18, but was in NSR when he arrived to short stay. He was later seen by Dr. Curt Bears and underwent Afib ablation on 11/28/16. He has maintained NSR on follow up. He is also followed in the CHF clinic. Repeat Echo in May showed improvement in EF to 45%. His amiodarone was discontinued on 12/26/16.   On follow up today he is doing well. He is in Cardiac Rehab and feels his energy level is better. No dyspnea. Monitors weight daily and it has been stable. Weight is down from 245 lbs in January to 185 now. No palpitations. No dizziness. Tolerating medication well but titration limited by low BP. He does note more bruising on his arms.  Current Outpatient Prescriptions on File Prior to Visit  Medication Sig Dispense Refill  . allopurinol (ZYLOPRIM) 300 MG tablet Take 300 mg by mouth daily.     Marland Kitchen aspirin 81 MG chewable tablet Chew 1 tablet (81 mg total) by mouth daily. 30 tablet 6  . colchicine 0.6 MG tablet Take 0.6 mg by mouth daily as needed (for gout).     Marland Kitchen digoxin (LANOXIN) 0.125 MG tablet Take 1 tablet (0.125 mg total) by mouth daily. 30  tablet 6  . docusate sodium (COLACE) 100 MG capsule Take 100 mg by mouth daily.    Marland Kitchen losartan (COZAAR) 25 MG tablet Take 0.5 tablets (12.5 mg total) by mouth at bedtime. 30 tablet 11  . metoprolol succinate (TOPROL-XL) 25 MG 24 hr tablet Take 0.5 tablets (12.5 mg total) by mouth daily. 30 tablet 6  . Psyllium (METAMUCIL PO) Take 2 capsules by mouth 2 (two) times daily.     Marland Kitchen spironolactone (ALDACTONE) 25 MG tablet Take 0.5 tablets (12.5 mg total) by mouth daily after supper. Take 1/2 tab once daily    . tamsulosin (FLOMAX) 0.4 MG CAPS capsule Take 0.4 mg by mouth every other day.     . tolterodine (DETROL LA) 4 MG 24 hr capsule Take 4 mg by mouth daily.   0  . torsemide (DEMADEX) 20 MG tablet Take 1 tablet (20 mg total) by mouth PC lunch.    . warfarin (COUMADIN) 5 MG tablet Take 0.5-1 tablets (2.5-5 mg total) by mouth See admin instructions. Take 5 mg by mouth Monday, Wednesday, Friday and Sunday. Take 2.5 mg by mouth on all other days. (Patient taking differently: Take 2.5-5 mg by mouth See admin instructions. Takes 5 mg on Tuesday, Thursday, and Saturday and takes 2.5 mg (1/2) tab on Sun, Mon, Wed, and Fri) 45 tablet 6   No current facility-administered medications on file prior to visit.  Allergies  Allergen Reactions  . Celecoxib Rash and Other (See Comments)    Celebrex    Past Medical History:  Diagnosis Date  . Anticoagulant long-term use   . Arthritis    "probably in my thumbs" (06/26/2012)  . Bifascicular block   . Chronic combined systolic and diastolic CHF (congestive heart failure) ([place])    a. 01/2016 Echo: EF 45-50%, gr2 DD, inflat AK (poor acoustic windows);  b. 07/2016 Echo: EF 25-30%, mild LVH, PASP 61mmHg (pt in Afib).  . Complication of anesthesia    "wake up w/a start; hallucinations" (06/26/2012)  . Critical Aortic Stenosis    a. 03/2003 s/p SJM #25 mech prosthetic AoV;  b. 07/2016 Echo: EF 25-30% (in setting of AF), AoV area 1.72 cm^2 (VTI), 1.75 cm^2 (Vmean).  .  Diverticulosis   . Gouty arthritis    "have had it in both feet, ankles, right knee" (06/26/2012)  . History of diverticulitis of colon   . History of kidney stones   . History of pancreatitis   . Hypertension   . Incomplete RBBB   . PAF (paroxysmal atrial fibrillation) (HCC)    a. CHA2DS2VASc = 4-->chronic coumadin in setting of mech AoV;  b. 07/2016 Recurrent AF RVR.    Past Surgical History:  Procedure Laterality Date  . AORTIC VALVE REPLACEMENT  04-14-2003  DR Servando Snare   #76mm ST JUDE MECHNICAL PROSTHESIS  . ATRIAL FIBRILLATION ABLATION N/A 11/28/2016   Procedure: Atrial Fibrillation Ablation;  Surgeon: Constance Haw, MD;  Location: [place] [facility];  Service: Cardiovascular;  Laterality: N/A;  . CARDIOVERSION N/A 08/18/2016   Procedure: CARDIOVERSION;  Surgeon: Thayer Headings, MD;  Location: [facility] ENDOSCOPY;  Service: Cardiovascular;  Laterality: N/A;  . CARDIOVERSION N/A 08/28/2016   Procedure: CARDIOVERSION;  Surgeon: Larey Dresser, MD;  Location: [place];  Service: Cardiovascular;  Laterality: N/A;  . CATARACT EXTRACTION W/ INTRAOCULAR LENS  IMPLANT, BILATERAL  RIGHT 2010/  LEFT DEC 2013  . HEMICOLECTOMY  10/06/2003   Laparoscopic-assisted left hemicolectomy for diverticulitis  . LAPAROSCOPIC CHOLECYSTECTOMY  07-23-2006  . PROSTATE BIOPSY  10/09/2012   gleason 3+4=7  . RADIOACTIVE SEED IMPLANT N/A 01/09/2013   Procedure: RADIOACTIVE SEED IMPLANT;  Surgeon: Franchot Gallo, MD;  Location: [facility];  Service: Urology;  Laterality: N/A;  . TEE WITHOUT CARDIOVERSION N/A 08/18/2016   Procedure: TRANSESOPHAGEAL ECHOCARDIOGRAM (TEE);  Surgeon: Thayer Headings, MD;  Location: [place];  Service: Cardiovascular;  Laterality: N/A;  . TRANSTHORACIC ECHOCARDIOGRAM  04-28-2008   MILD LVH / NORMAL LSF/ NORMAL AORTIC VALVE MECHANICAL PROSTHESIS FUNCTION/ MILD LAE/ EF 55-60%    History  Smoking Status  . Never Smoker  Smokeless Tobacco  . Never Used     History  Alcohol Use  . 1.8 oz/week  . 3 Cans of beer per week    Comment:  "may have 2-3 beers on the weekend"    Family History  Problem Relation Age of Onset  . Heart failure Mother   . Diabetes Mother   . Prostate cancer Father     Review of Systems: As noted in history of present illness.  All other systems were reviewed and are negative.  Physical Exam: BP 104/60   Pulse 80   Ht 5\' 10"  (1.778 m)   Wt 185 lb (83.9 kg)   BMI 26.54 kg/m  WD  WM in NAD.   The HEENT exam is normal.   The carotids are 2+ without bruits.  There is  no thyromegaly.  There is no JVD.  The lungs are clear.  The heart exam reveals a regular rate with a good mechanical aortic valve click.  There are no murmurs, gallops, or rubs.  The PMI is not displaced.   Abdominal exam reveals good bowel sounds.  There is no guarding or rebound.  There is no hepatosplenomegaly or tenderness.  There are no masses.  Exam of the legs reveal tr-1+ edema.  The legs are without rashes.  The distal pulses are intact.  Some bruising on forearms. Cranial nerves II - XII are intact.  Motor and sensory functions are intact.  The gait is normal.  LABORATORY DATA:  Lab Results  Component Value Date   WBC 10.6 (H) 10/18/2016   HGB 13.3 10/18/2016   HCT 40.9 10/18/2016   PLT 172 10/18/2016   GLUCOSE 102 (H) 12/05/2016   ALT 39 12/05/2016   AST 52 (H) 12/05/2016   NA 138 12/05/2016   K 4.2 12/05/2016   CL 101 12/05/2016   CREATININE 1.16 12/05/2016   BUN 21 (H) 12/05/2016   CO2 29 12/05/2016   TSH 3.210 12/05/2016   INR 1.87 12/05/2016    Echo 12/05/16: Study Conclusions  - Left ventricle: The cavity size was normal. Wall thickness was   increased in a pattern of mild LVH. The estimated ejection   fraction was 45%. Diffuse hypokinesis. Doppler parameters are   consistent with abnormal left ventricular relaxation (grade 1   diastolic dysfunction). - Aortic valve: There is a Probation officer aortic valve. No    significant stenosis. There was trivial regurgitation. Mean   gradient (S): 12 mm Hg. - Aorta: Dilated ascending aorta. Ascending aortic diameter: 44 mm   (S). - Mitral valve: Mildly calcified annulus. There was trivial   regurgitation. - Left atrium: The atrium was mildly dilated. - Right ventricle: The cavity size was normal. Systolic function   was mildly reduced. - Tricuspid valve: Peak RV-RA gradient (S): 21 mm Hg. - Pulmonary arteries: PA peak pressure: 24 mm Hg (S). - Inferior vena cava: The vessel was normal in size. The   respirophasic diameter changes were in the normal range (>= 50%),   consistent with normal central venous pressure.  Impressions:  - Normal LV size with mild LV hypertrophy. EF 45%, diffuse   hypokinesis. Normal RV size with mildly decreased systolic   function. Mechanical aortic valve functioning normally.   Assessment / Plan: 1. History of severe AS s/p mechanical AVR in 2004. Normal valve function by exam and Echo.  Continue long term coumadin therapy. On ASA 81 mg daily. Recommendations for SBE prophylaxis reviewed.   2. . Chronic systolic CHF: EF on TEE in 1/18 20-25%, mechanical aortic valve ok. 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 Since Afib ablation and restoration of NSR EF improved to 45% which was his baseline. Currently, NYHA class II symptoms and does not appear volume overloaded. Weight is down considerably.  Medication titration has been limited by lightheadedness, he is doing ok on current regimen.  - Continue current dose to torsemide, 20 mg daily.  - Continue digoxin 0.125 daily, level 0.9.  - Continue spironolactone 12.5 daily.  - Can continue Toprol XL and losartan at low doses.   3. Atrial fibrillation: Admitted 1/18 with atrial fibrillation with RVR and CHF. EF down to 20-25%.  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 Need to keep himin NSR. DCCV during 2/18 admission and now s/p atrial fibrillation ablation.  He is in NSR today.  - amiodarone already discontinued.  4. CKD: Stage III.  stable.  5. HTN- controlled.  6. Ascending aortic aneurysm: 4.4 cm on 5/18 echo.  F/u with MRI one year.

## 2017-01-09 NOTE — Progress Notes (Signed)
Cardiac Individual Treatment Plan  Patient Details  Name: Adrian Lambert MRN: [id number] Date of Birth: [birth year] Referring Provider:     CARDIAC REHAB PHASE II ORIENTATION from 11/16/2016 in [place]  Referring Provider  [name] MD      Initial Encounter Date:    CARDIAC REHAB PHASE II ORIENTATION from 11/16/2016 in [place]  Date  11/16/16  Referring Provider  [name] MD      Visit Diagnosis: Heart failure, chronic systolic ([place])  Patient's Home Medications on Admission:  Current Outpatient Prescriptions:  .  allopurinol (ZYLOPRIM) 300 MG tablet, Take 300 mg by mouth daily. , Disp: , Rfl:  .  aspirin 81 MG chewable tablet, Chew 1 tablet (81 mg total) by mouth daily., Disp: 30 tablet, Rfl: 6 .  colchicine 0.6 MG tablet, Take 0.6 mg by mouth daily as needed (for gout). , Disp: , Rfl:  .  digoxin (LANOXIN) 0.125 MG tablet, Take 1 tablet (0.125 mg total) by mouth daily., Disp: 30 tablet, Rfl: 6 .  docusate sodium (COLACE) 100 MG capsule, Take 100 mg by mouth daily., Disp: , Rfl:  .  losartan (COZAAR) 25 MG tablet, Take 0.5 tablets (12.5 mg total) by mouth at bedtime., Disp: 30 tablet, Rfl: 11 .  metoprolol succinate (TOPROL-XL) 25 MG 24 hr tablet, Take 0.5 tablets (12.5 mg total) by mouth daily., Disp: 30 tablet, Rfl: 6 .  Psyllium (METAMUCIL PO), Take 2 capsules by mouth 2 (two) times daily. , Disp: , Rfl:  .  spironolactone (ALDACTONE) 25 MG tablet, Take 0.5 tablets (12.5 mg total) by mouth daily after supper. Take 1/2 tab once daily, Disp: , Rfl:  .  tamsulosin (FLOMAX) 0.4 MG CAPS capsule, Take 0.4 mg by mouth every other day. , Disp: , Rfl:  .  tolterodine (DETROL LA) 4 MG 24 hr capsule, Take 4 mg by mouth daily. , Disp: , Rfl: 0 .  torsemide (DEMADEX) 20 MG tablet, Take 1 tablet (20 mg total) by mouth PC lunch., Disp: , Rfl:  .  warfarin (COUMADIN) 5 MG tablet, Take 0.5-1 tablets (2.5-5 mg  total) by mouth See admin instructions. Take 5 mg by mouth Monday, Wednesday, Friday and Sunday. Take 2.5 mg by mouth on all other days. (Patient taking differently: Take 2.5-5 mg by mouth See admin instructions. Takes 5 mg on Tuesday, Thursday, and Saturday and takes 2.5 mg (1/2) tab on Sun, Mon, Wed, and Fri), Disp: 45 tablet, Rfl: 6  Past Medical History: Past Medical History:  Diagnosis Date  . Anticoagulant long-term use   . Arthritis    "probably in my thumbs" (06/26/2012)  . Bifascicular block   . Chronic combined systolic and diastolic CHF (congestive heart failure) ([place])    a. 01/2016 Echo: EF 45-50%, gr2 DD, inflat AK (poor acoustic windows);  b. 07/2016 Echo: EF 25-30%, mild LVH, PASP 26mmHg (pt in Afib).  . Complication of anesthesia    "wake up w/a start; hallucinations" (06/26/2012)  . Critical Aortic Stenosis    a. 03/2003 s/p SJM #25 mech prosthetic AoV;  b. 07/2016 Echo: EF 25-30% (in setting of AF), AoV area 1.72 cm^2 (VTI), 1.75 cm^2 (Vmean).  . Diverticulosis   . Gouty arthritis    "have had it in both feet, ankles, right knee" (06/26/2012)  . History of diverticulitis of colon   . History of kidney stones   . History of pancreatitis   . Hypertension   .  Incomplete RBBB   . PAF (paroxysmal atrial fibrillation) ([facility])    a. CHA2DS2VASc = 4-->chronic coumadin in setting of mech AoV;  b. 07/2016 Recurrent AF RVR.    Tobacco Use: History  Smoking Status  . Never Smoker  Smokeless Tobacco  . Never Used    Labs: Recent Review Flowsheet Data    Labs for ITP Cardiac and Pulmonary Rehab Latest Ref Rng & Units 08/25/2016 08/26/2016 08/27/2016 08/28/2016 08/29/2016   TCO2 0 - 100 mmol/L - - - - -   O2SAT % 75.7 60.7 57.9 53.0 71.6      Capillary Blood Glucose: Lab Results  Component Value Date   GLUCAP 123 (H) 01/09/2013     Exercise Target Goals:    Exercise Program Goal: Individual exercise prescription set with THRR, safety & activity barriers. Participant demonstrates  ability to understand and report RPE using BORG scale, to self-measure pulse accurately, and to acknowledge the importance of the exercise prescription.  Exercise Prescription Goal: Starting with aerobic activity 30 plus minutes a day, 3 days per week for initial exercise prescription. Provide home exercise prescription and guidelines that participant acknowledges understanding prior to discharge.  Activity Barriers & Risk Stratification:     Activity Barriers & Cardiac Risk Stratification - 11/16/16 0810      Activity Barriers & Cardiac Risk Stratification   Activity Barriers Deconditioning;Muscular Weakness;Balance Concerns;History of Falls   Cardiac Risk Stratification High      6 Minute Walk:     6 Minute Walk    Row Name 11/16/16 1327         6 Minute Walk   Phase Initial     Distance 1046 feet     Walk Time 6 minutes     # of Rest Breaks 0     MPH 1.98     METS 1.75     RPE 12     VO2 Peak 6.13     Symptoms No     Resting HR 70 bpm     Resting BP 114/70     Max Ex. HR 77 bpm     Max Ex. BP 110/62     2 Minute Post BP 108/64        Oxygen Initial Assessment:   Oxygen Re-Evaluation:   Oxygen Discharge (Final Oxygen Re-Evaluation):   Initial Exercise Prescription:     Initial Exercise Prescription - 11/16/16 1300      Date of Initial Exercise RX and Referring Provider   Date 11/16/16   Referring Provider [name] MD     Treadmill   MPH 1.7   Grade 0   Minutes 10   METs 2.3     Recumbant Bike   Level 1.5   Minutes 10   METs 1     NuStep   Level 2   SPM 70   Minutes 10   METs 2     Prescription Details   Frequency (times per week) 3   Duration Progress to 30 minutes of continuous aerobic without signs/symptoms of physical distress     Intensity   THRR 40-80% of Max Heartrate 58-116   Ratings of Perceived Exertion 11-13   Perceived Dyspnea 0-4     Progression   Progression Continue to progress workloads to maintain intensity  without signs/symptoms of physical distress.     Resistance Training   Training Prescription Yes   Weight 2lbs   Reps 10-15      Perform Capillary Blood Glucose  checks as needed.  Exercise Prescription Changes:     Exercise Prescription Changes    Row Name 11/27/16 1000 12/13/16 1000 12/26/16 1200         Response to Exercise   Blood Pressure (Admit) 94/60 95/60 104/70     Blood Pressure (Exercise) 126/64 118/70 122/74     Blood Pressure (Exit) 102/62 102/62 90/56  recheck after water 99/61     Heart Rate (Admit) 70 bpm 83 bpm 79 bpm     Heart Rate (Exercise) 87 bpm 100 bpm 99 bpm     Heart Rate (Exit) 67 bpm 76 bpm 79 bpm     Rating of Perceived Exertion (Exercise) 13 12 13      Symptoms none none none     Comments pt was oriented to exercise equipment on 11/20/16. Pt responded well to first week of cardiac rehab pt was oriented to exercise equipment on 11/20/16. Pt responded well to first week of cardiac rehab pt was oriented to exercise equipment on 11/20/16. Pt responded well to first week of cardiac rehab     Duration Continue with 30 min of aerobic exercise without signs/symptoms of physical distress. Continue with 30 min of aerobic exercise without signs/symptoms of physical distress. Continue with 30 min of aerobic exercise without signs/symptoms of physical distress.     Intensity THRR unchanged THRR unchanged THRR unchanged       Progression   Progression Continue to progress workloads to maintain intensity without signs/symptoms of physical distress. Continue to progress workloads to maintain intensity without signs/symptoms of physical distress. Continue to progress workloads to maintain intensity without signs/symptoms of physical distress.     Average METs 2.9 3 3.1       Resistance Training   Training Prescription Yes Yes Yes     Weight 3lbs 3lbs 3lbs     Reps 10-15 10-15 10-15     Time 10 Minutes 10 Minutes 10 Minutes       Treadmill   MPH 2 2.1 2.1     Grade  0 1 1     Minutes 10 10 10      METs 2.53 2.9 2.9       Recumbant Bike   Level 2 2.5 2.5     Minutes 10 10 10      METs 2.8 2.8 2.8       NuStep   Level 3 3 3      SPM 80 80 90     Minutes 10 10 10      METs 3.5 3.2 3.5       Home Exercise Plan   Plans to continue exercise at  - Home (comment)  on treadmill 2(15' bouts) Home (comment)  on treadmill 2(15' bouts)     Frequency  - Add 2 additional days to program exercise sessions. Add 2 additional days to program exercise sessions.     Initial Home Exercises Provided  - 12/11/16 12/11/16        Exercise Comments:     Exercise Comments    Row Name 12/13/16 1033 01/08/17 1011         Exercise Comments Reviewed METs and goals. Pt is tolerating exercise well; will continue to monitor pt's exercise progression Reviewed METs and goals. Pt is tolerating exercise well; will continue to monitor pt's exercise progression         Exercise Goals and Review:     Exercise Goals    Row Name 11/16/16 [id number]  Exercise Goals   Increase Physical Activity Yes       Intervention Provide advice, education, support and counseling about physical activity/exercise needs.;Develop an individualized exercise prescription for aerobic and resistive training based on initial evaluation findings, risk stratification, comorbidities and participant's personal goals.       Expected Outcomes Achievement of increased cardiorespiratory fitness and enhanced flexibility, muscular endurance and strength shown through measurements of functional capacity and personal statement of participant.       Increase Strength and Stamina Yes       Intervention Provide advice, education, support and counseling about physical activity/exercise needs.;Develop an individualized exercise prescription for aerobic and resistive training based on initial evaluation findings, risk stratification, comorbidities and participant's personal goals.  get back to hiking and  fishing. Learn how to exercise safely.       Expected Outcomes Achievement of increased cardiorespiratory fitness and enhanced flexibility, muscular endurance and strength shown through measurements of functional capacity and personal statement of participant.          Exercise Goals Re-Evaluation :     Exercise Goals Re-Evaluation    Row Name 12/11/16 1627 01/01/17 1732 01/08/17 1011         Exercise Goal Re-Evaluation   Exercise Goals Review Increase Physical Activity;Increase Strenth and Stamina Increase Physical Activity;Increase Strenth and Stamina  -     Comments Reviewed home exercise with pt today.  Pt plans to walk on treadmill (2, 15 minute bouts) for exercise 1-2x/week in addition to coming to cardiac rehab.  Reviewed THR, pulse, RPE, sign and symptoms, and when to call 911 or MD.  Also discussed weather considerations and indoor options.  Pt voiced understanding. HEP is walking at home.  pt reports increased ability to climb steps adn walk longer distances without dyspea.   HEP is walking at home.  pt reports increased ability to climb steps and walk longer distances without dyspnea.  Pt also struggles with balance. Gave HO on balance exercises.     Expected Outcomes Pt will be compliant with HEP and improve in cardiorespiratory fitness Pt will be compliant with HEP and improve in cardiorespiratory fitness Pt will be compliant with HEP and improve in cardiorespiratory fitness and balance         Discharge Exercise Prescription (Final Exercise Prescription Changes):     Exercise Prescription Changes - 12/26/16 1200      Response to Exercise   Blood Pressure (Admit) 104/70   Blood Pressure (Exercise) 122/74   Blood Pressure (Exit) 90/56  recheck after water 99/61   Heart Rate (Admit) 79 bpm   Heart Rate (Exercise) 99 bpm   Heart Rate (Exit) 79 bpm   Rating of Perceived Exertion (Exercise) 13   Symptoms none   Comments pt was oriented to exercise equipment on 11/20/16. Pt  responded well to first week of cardiac rehab   Duration Continue with 30 min of aerobic exercise without signs/symptoms of physical distress.   Intensity THRR unchanged     Progression   Progression Continue to progress workloads to maintain intensity without signs/symptoms of physical distress.   Average METs 3.1     Resistance Training   Training Prescription Yes   Weight 3lbs   Reps 10-15   Time 10 Minutes     Treadmill   MPH 2.1   Grade 1   Minutes 10   METs 2.9     Recumbant Bike   Level 2.5   Minutes 10   METs 2.8  NuStep   Level 3   SPM 90   Minutes 10   METs 3.5     Home Exercise Plan   Plans to continue exercise at Home (comment)  on treadmill 2(15' bouts)   Frequency Add 2 additional days to program exercise sessions.   Initial Home Exercises Provided 12/11/16      Nutrition:  Target Goals: Understanding of nutrition guidelines, daily intake of sodium 1500mg , cholesterol 200mg , calories 30% from fat and 7% or less from saturated fats, daily to have 5 or more servings of fruits and vegetables.  Biometrics:     Pre Biometrics - 11/16/16 1328      Pre Biometrics   Waist Circumference 42 inches   Hip Circumference 40 inches   Waist to Hip Ratio 1.05 %   Triceps Skinfold 14 mm   % Body Fat 27.7 %   Grip Strength 35 kg   Flexibility 9.25 in   Single Leg Stand 1.65 seconds       Nutrition Therapy Plan and Nutrition Goals:   Nutrition Discharge: Nutrition Scores:   Nutrition Goals Re-Evaluation:   Nutrition Goals Re-Evaluation:   Nutrition Goals Discharge (Final Nutrition Goals Re-Evaluation):   Psychosocial: Target Goals: Acknowledge presence or absence of significant depression and/or stress, maximize coping skills, provide positive support system. Participant is able to verbalize types and ability to use techniques and skills needed for reducing stress and depression.  Initial Review & Psychosocial Screening:     Initial  Psych Review & Screening - 11/16/16 1608      Initial Review   Current issues with None Identified     Family Dynamics   Good Support System? Yes     Barriers   Psychosocial barriers to participate in program There are no identifiable barriers or psychosocial needs.     Screening Interventions   Interventions Encouraged to exercise      Quality of Life Scores:     Quality of Life - 11/24/16 1030      Quality of Life Scores   Health/Function Pre 24.47 %  pt concerned about fatigue, decreased stamina.  pt is concerned symptoms are medication related. pt scheduled for ablation 11/28/2016.  pt has discussed his concerns with Dr. Aundra Dubin.    Socioeconomic Pre 24.64 %   Psych/Spiritual Pre 30 %   Family Pre 26.4 %   GLOBAL Pre 25.93 %  overall scores good. pt encouraged continued CR participation will likely decrease symptoms, in addition to ablation intervention for arrhythmia control.   pt offered emotional support and reassurance.       PHQ-9: Recent Review Flowsheet Data    Depression screen [facility] 2/9 11/20/2016   Decreased Interest 0   Down, Depressed, Hopeless 0   PHQ - 2 Score 0     Interpretation of Total Score  Total Score Depression Severity:  1-4 = Minimal depression, 5-9 = Mild depression, 10-14 = Moderate depression, 15-19 = Moderately severe depression, 20-27 = Severe depression   Psychosocial Evaluation and Intervention:     Psychosocial Evaluation - 11/20/16 0913      Psychosocial Evaluation & Interventions   Interventions Encouraged to exercise with the program and follow exercise prescription   Comments no psychosocial needs identified, no interventions necessary.    Expected Outcomes pt will demonstrate positive hopeful outlook with good coping skills.    Continue Psychosocial Services  No Follow up required      Psychosocial Re-Evaluation:     Psychosocial Re-Evaluation  [place] Name 01/01/17 1731             Psychosocial Re-Evaluation    Current issues with None Identified       Comments no psychosocial needs identified, no interventions necessary        Expected Outcomes pt will exhibit positive outlook with good coping skills.        Interventions Encouraged to attend Cardiac Rehabilitation for the exercise       Continue Psychosocial Services  No Follow up required          Psychosocial Discharge (Final Psychosocial Re-Evaluation):     Psychosocial Re-Evaluation - 01/01/17 1731      Psychosocial Re-Evaluation   Current issues with None Identified   Comments no psychosocial needs identified, no interventions necessary    Expected Outcomes pt will exhibit positive outlook with good coping skills.    Interventions Encouraged to attend Cardiac Rehabilitation for the exercise   Continue Psychosocial Services  No Follow up required      Vocational Rehabilitation: Provide vocational rehab assistance to qualifying candidates.   Vocational Rehab Evaluation & Intervention:     Vocational Rehab - 11/16/16 1610      Initial Vocational Rehab Evaluation & Intervention   Assessment shows need for Vocational Rehabilitation No      Education: Education Goals: Education classes will be provided on a weekly basis, covering required topics. Participant will state understanding/return demonstration of topics presented.  Learning Barriers/Preferences:     Learning Barriers/Preferences - 11/16/16 1610      Learning Barriers/Preferences   Learning Barriers None   Learning Preferences Written Material;Video;Verbal Instruction;Skilled Demonstration      Education Topics: Count Your Pulse:  -Group instruction provided by verbal instruction, demonstration, patient participation and written materials to support subject.  Instructors address importance of being able to find your pulse and how to count your pulse when at home without a heart monitor.  Patients get hands on experience counting their pulse with staff help and  individually.   CARDIAC REHAB PHASE II EXERCISE from 01/05/2017 in [place]  Date  12/22/16  Instruction Review Code  2- meets goals/outcomes      Heart Attack, Angina, and Risk Factor Modification:  -Group instruction provided by verbal instruction, video, and written materials to support subject.  Instructors address signs and symptoms of angina and heart attacks.    Also discuss risk factors for heart disease and how to make changes to improve heart health risk factors.   Functional Fitness:  -Group instruction provided by verbal instruction, demonstration, patient participation, and written materials to support subject.  Instructors address safety measures for doing things around the house.  Discuss how to get up and down off the floor, how to pick things up properly, how to safely get out of a chair without assistance, and balance training.   CARDIAC REHAB PHASE II EXERCISE from 01/05/2017 in [place]  Date  01/05/17  Instruction Review Code  2- meets goals/outcomes      Meditation and Mindfulness:  -Group instruction provided by verbal instruction, patient participation, and written materials to support subject.  Instructor addresses importance of mindfulness and meditation practice to help reduce stress and improve awareness.  Instructor also leads participants through a meditation exercise.    CARDIAC REHAB PHASE II EXERCISE from 01/05/2017 in [place]  Date  12/27/16  Instruction Review Code  2- meets goals/outcomes  Stretching for Flexibility and Mobility:  -Group instruction provided by verbal instruction, patient participation, and written materials to support subject.  Instructors lead participants through series of stretches that are designed to increase flexibility thus improving mobility.  These stretches are additional exercise for major muscle groups that are typically  performed during regular warm up and cool down.   Hands Only CPR:  -Group verbal, video, and participation provides a basic overview of AHA guidelines for community CPR. Role-play of emergencies allow participants the opportunity to practice calling for help and chest compression technique with discussion of AED use.   Hypertension: -Group verbal and written instruction that provides a basic overview of hypertension including the most recent diagnostic guidelines, risk factor reduction with self-care instructions and medication management.   CARDIAC REHAB PHASE II EXERCISE from 01/05/2017 in [place]  Date  12/29/16  Instruction Review Code  2- meets goals/outcomes       Nutrition I class: Heart Healthy Eating:  -Group instruction provided by PowerPoint slides, verbal discussion, and written materials to support subject matter. The instructor gives an explanation and review of the Therapeutic Lifestyle Changes diet recommendations, which includes a discussion on lipid goals, dietary fat, sodium, fiber, plant stanol/sterol esters, sugar, and the components of a well-balanced, healthy diet.   Nutrition II class: Lifestyle Skills:  -Group instruction provided by PowerPoint slides, verbal discussion, and written materials to support subject matter. The instructor gives an explanation and review of label reading, grocery shopping for heart health, heart healthy recipe modifications, and ways to make healthier choices when eating out.   Diabetes Question & Answer:  -Group instruction provided by PowerPoint slides, verbal discussion, and written materials to support subject matter. The instructor gives an explanation and review of diabetes co-morbidities, pre- and post-prandial blood glucose goals, pre-exercise blood glucose goals, signs, symptoms, and treatment of hypoglycemia and hyperglycemia, and foot care basics.   Diabetes Blitz:  -Group instruction  provided by PowerPoint slides, verbal discussion, and written materials to support subject matter. The instructor gives an explanation and review of the physiology behind type 1 and type 2 diabetes, diabetes medications and rational behind using different medications, pre- and post-prandial blood glucose recommendations and Hemoglobin A1c goals, diabetes diet, and exercise including blood glucose guidelines for exercising safely.    Portion Distortion:  -Group instruction provided by PowerPoint slides, verbal discussion, written materials, and food models to support subject matter. The instructor gives an explanation of serving size versus portion size, changes in portions sizes over the last 20 years, and what consists of a serving from each food group.   Stress Management:  -Group instruction provided by verbal instruction, video, and written materials to support subject matter.  Instructors review role of stress in heart disease and how to cope with stress positively.     CARDIAC REHAB PHASE II EXERCISE from 01/05/2017 in [place]  Date  11/22/16  Instruction Review Code  2- meets goals/outcomes      Exercising on Your Own:  -Group instruction provided by verbal instruction, power point, and written materials to support subject.  Instructors discuss benefits of exercise, components of exercise, frequency and intensity of exercise, and end points for exercise.  Also discuss use of nitroglycerin and activating EMS.  Review options of places to exercise outside of rehab.  Review guidelines for sex with heart disease.   Cardiac Drugs I:  -Group instruction provided by verbal instruction and written materials to  support subject.  Instructor reviews cardiac drug classes: antiplatelets, anticoagulants, beta blockers, and statins.  Instructor discusses reasons, side effects, and lifestyle considerations for each drug class.   Cardiac Drugs II:  -Group instruction  provided by verbal instruction and written materials to support subject.  Instructor reviews cardiac drug classes: angiotensin converting enzyme inhibitors (ACE-I), angiotensin II receptor blockers (ARBs), nitrates, and calcium channel blockers.  Instructor discusses reasons, side effects, and lifestyle considerations for each drug class.   CARDIAC REHAB PHASE II EXERCISE from 01/05/2017 in [place]  Date  01/03/17  Educator  Kennyth Lose  Instruction Review Code  2- meets goals/outcomes      Anatomy and Physiology of the Circulatory System:  Group verbal and written instruction and models provide basic cardiac anatomy and physiology, with the coronary electrical and arterial systems. Review of: AMI, Angina, Valve disease, Heart Failure, Peripheral Artery Disease, Cardiac Arrhythmia, Pacemakers, and the ICD.   Other Education:  -Group or individual verbal, written, or video instructions that support the educational goals of the cardiac rehab program.   Knowledge Questionnaire Score:     Knowledge Questionnaire Score - 11/16/16 1326      Knowledge Questionnaire Score   Pre Score 23/24      Core Components/Risk Factors/Patient Goals at Admission:     Personal Goals and Risk Factors at Admission - 11/16/16 1337      Core Components/Risk Factors/Patient Goals on Admission    Weight Management Yes;Weight Loss;Weight Maintenance   Intervention Weight Management: Develop a combined nutrition and exercise program designed to reach desired caloric intake, while maintaining appropriate intake of nutrient and fiber, sodium and fats, and appropriate energy expenditure required for the weight goal.;Weight Management: Provide education and appropriate resources to help participant work on and attain dietary goals.;Weight Management/Obesity: Establish reasonable short term and long term weight goals.   Expected Outcomes Short Term: Continue to assess and modify  interventions until short term weight is achieved;Long Term: Adherence to nutrition and physical activity/exercise program aimed toward attainment of established weight goal;Weight Maintenance: Understanding of the daily nutrition guidelines, which includes 25-35% calories from fat, 7% or less cal from saturated fats, less than 200mg  cholesterol, less than 1.5gm of sodium, & 5 or more servings of fruits and vegetables daily;Weight Loss: Understanding of general recommendations for a balanced deficit meal plan, which promotes 1-2 lb weight loss per week and includes a negative energy balance of [phone] kcal/d;Understanding recommendations for meals to include 15-35% energy as protein, 25-35% energy from fat, 35-60% energy from carbohydrates, less than 200mg  of dietary cholesterol, 20-35 gm of total fiber daily;Understanding of distribution of calorie intake throughout the day with the consumption of 4-5 meals/snacks   Hypertension Yes   Intervention Provide education on lifestyle modifcations including regular physical activity/exercise, weight management, moderate sodium restriction and increased consumption of fresh fruit, vegetables, and low fat dairy, alcohol moderation, and smoking cessation.;Monitor prescription use compliance.   Expected Outcomes Short Term: Continued assessment and intervention until BP is < 140/33mm HG in hypertensive participants. < 130/26mm HG in hypertensive participants with diabetes, heart failure or chronic kidney disease.;Long Term: Maintenance of blood pressure at goal levels.   Personal Goal Other Yes   Personal Goal Learn better medication management   Intervention Provide cardiac education classes geared toward medication management and risk factor modification to assist with improving pt's overall quality of life   Expected Outcomes Pt have increased knowledge and understanding of medication and modifiable risk factors  Core Components/Risk Factors/Patient Goals  Review:      Goals and Risk Factor Review    Row Name 12/13/16 1641 01/01/17 1731           Core Components/Risk Factors/Patient Goals Review   Personal Goals Review Weight Management/Obesity;Hypertension;Other Weight Management/Obesity;Hypertension;Other      Review pt demonstrates eagerness to participate in CR activities.  pt demonstrates eagerness to participate in CR activities.       Expected Outcomes pt will participate in CR exercise, nutrition and lifestyle education opportunities to decrease overall CAD risk factors.  pt will participate in CR exercise, nutrition and lifestyle education opportunities to decrease overall CAD risk factors.          Core Components/Risk Factors/Patient Goals at Discharge (Final Review):      Goals and Risk Factor Review - 01/01/17 1731      Core Components/Risk Factors/Patient Goals Review   Personal Goals Review Weight Management/Obesity;Hypertension;Other   Review pt demonstrates eagerness to participate in CR activities.    Expected Outcomes pt will participate in CR exercise, nutrition and lifestyle education opportunities to decrease overall CAD risk factors.       ITP Comments:     ITP Comments    Row Name 11/16/16 0809 01/09/17 1557         ITP Comments Medical Director, Dr. Fransico Him Medical Director, Dr. Fransico Him         Comments: Pt is making expected progress toward personal goals after completing 15 sessions. Recommend continued exercise and life style modification education including  stress management and relaxation techniques to decrease cardiac risk profile.

## 2017-01-10 ENCOUNTER — Encounter (HOSPITAL_COMMUNITY)
Admission: RE | Admit: 2017-01-10 | Discharge: 2017-01-10 | Disposition: A | Discharge status: Home / Self Care | Payer: Medicare Other | Source: Ambulatory Visit | Attending: Cardiology | Admitting: Cardiology

## 2017-01-10 ENCOUNTER — Ambulatory Visit (INDEPENDENT_AMBULATORY_CARE_PROVIDER_SITE_OTHER): Payer: Medicare Other | Admitting: Cardiology

## 2017-01-10 ENCOUNTER — Encounter: Payer: Self-pay | Admitting: Cardiology

## 2017-01-10 VITALS — BP 104/60 | HR 80 | Ht 70.0 in | Wt 185.0 lb

## 2017-01-10 DIAGNOSIS — Z952 Presence of prosthetic heart valve: Secondary | ICD-10-CM

## 2017-01-10 DIAGNOSIS — I5022 Chronic systolic (congestive) heart failure: Secondary | ICD-10-CM

## 2017-01-10 DIAGNOSIS — I480 Paroxysmal atrial fibrillation: Secondary | ICD-10-CM | POA: Diagnosis not present

## 2017-01-10 DIAGNOSIS — I5042 Chronic combined systolic (congestive) and diastolic (congestive) heart failure: Secondary | ICD-10-CM

## 2017-01-10 DIAGNOSIS — C61 Malignant neoplasm of prostate: Secondary | ICD-10-CM | POA: Diagnosis not present

## 2017-01-10 NOTE — Patient Instructions (Addendum)
Continue your current therapy  Continue sodium restriction  I will see you in 3 months

## 2017-01-12 ENCOUNTER — Encounter (HOSPITAL_COMMUNITY)
Admission: RE | Admit: 2017-01-12 | Discharge: 2017-01-12 | Disposition: A | Discharge status: Home / Self Care | Payer: Medicare Other | Source: Ambulatory Visit | Attending: Cardiology | Admitting: Cardiology

## 2017-01-12 DIAGNOSIS — I5022 Chronic systolic (congestive) heart failure: Secondary | ICD-10-CM

## 2017-01-15 ENCOUNTER — Encounter (HOSPITAL_COMMUNITY)
Admission: RE | Admit: 2017-01-15 | Discharge: 2017-01-15 | Disposition: A | Discharge status: Home / Self Care | Payer: Medicare Other | Source: Ambulatory Visit | Attending: Cardiology | Admitting: Cardiology

## 2017-01-15 DIAGNOSIS — I5022 Chronic systolic (congestive) heart failure: Secondary | ICD-10-CM | POA: Diagnosis not present

## 2017-01-17 ENCOUNTER — Encounter (HOSPITAL_COMMUNITY)
Admission: RE | Admit: 2017-01-17 | Discharge: 2017-01-17 | Disposition: A | Discharge status: Home / Self Care | Payer: Medicare Other | Source: Ambulatory Visit | Attending: Cardiology | Admitting: Cardiology

## 2017-01-17 DIAGNOSIS — R350 Frequency of micturition: Secondary | ICD-10-CM | POA: Diagnosis not present

## 2017-01-17 DIAGNOSIS — I5022 Chronic systolic (congestive) heart failure: Secondary | ICD-10-CM

## 2017-01-17 DIAGNOSIS — N3281 Overactive bladder: Secondary | ICD-10-CM | POA: Diagnosis not present

## 2017-01-17 DIAGNOSIS — Z8546 Personal history of malignant neoplasm of prostate: Secondary | ICD-10-CM | POA: Diagnosis not present

## 2017-01-17 DIAGNOSIS — N401 Enlarged prostate with lower urinary tract symptoms: Secondary | ICD-10-CM | POA: Diagnosis not present

## 2017-01-19 ENCOUNTER — Encounter (HOSPITAL_COMMUNITY)
Admission: RE | Admit: 2017-01-19 | Discharge: 2017-01-19 | Disposition: A | Discharge status: Home / Self Care | Payer: Medicare Other | Source: Ambulatory Visit | Attending: Cardiology | Admitting: Cardiology

## 2017-01-19 DIAGNOSIS — I5022 Chronic systolic (congestive) heart failure: Secondary | ICD-10-CM | POA: Diagnosis not present

## 2017-01-22 ENCOUNTER — Encounter (HOSPITAL_COMMUNITY): Payer: Medicare Other

## 2017-01-26 ENCOUNTER — Encounter (HOSPITAL_COMMUNITY)
Admission: RE | Admit: 2017-01-26 | Discharge: 2017-01-26 | Disposition: A | Discharge status: Home / Self Care | Payer: Medicare Other | Source: Ambulatory Visit | Attending: Cardiology | Admitting: Cardiology

## 2017-01-26 DIAGNOSIS — I5022 Chronic systolic (congestive) heart failure: Secondary | ICD-10-CM | POA: Insufficient documentation

## 2017-01-28 ENCOUNTER — Emergency Department (HOSPITAL_BASED_OUTPATIENT_CLINIC_OR_DEPARTMENT_OTHER): Payer: Medicare Other

## 2017-01-28 ENCOUNTER — Emergency Department (HOSPITAL_BASED_OUTPATIENT_CLINIC_OR_DEPARTMENT_OTHER)
Admission: EM | Admit: 2017-01-28 | Discharge: 2017-01-28 | Disposition: A | Discharge status: Home / Self Care | Payer: Medicare Other | Attending: Emergency Medicine | Admitting: Emergency Medicine

## 2017-01-28 ENCOUNTER — Encounter (HOSPITAL_BASED_OUTPATIENT_CLINIC_OR_DEPARTMENT_OTHER): Payer: Self-pay | Admitting: Emergency Medicine

## 2017-01-28 DIAGNOSIS — Y929 Unspecified place or not applicable: Secondary | ICD-10-CM | POA: Insufficient documentation

## 2017-01-28 DIAGNOSIS — Z7982 Long term (current) use of aspirin: Secondary | ICD-10-CM | POA: Insufficient documentation

## 2017-01-28 DIAGNOSIS — Z79899 Other long term (current) drug therapy: Secondary | ICD-10-CM | POA: Insufficient documentation

## 2017-01-28 DIAGNOSIS — X58XXXA Exposure to other specified factors, initial encounter: Secondary | ICD-10-CM | POA: Diagnosis not present

## 2017-01-28 DIAGNOSIS — S01311A Laceration without foreign body of right ear, initial encounter: Secondary | ICD-10-CM | POA: Diagnosis not present

## 2017-01-28 DIAGNOSIS — Z8546 Personal history of malignant neoplasm of prostate: Secondary | ICD-10-CM | POA: Diagnosis not present

## 2017-01-28 DIAGNOSIS — I110 Hypertensive heart disease with heart failure: Secondary | ICD-10-CM | POA: Diagnosis not present

## 2017-01-28 DIAGNOSIS — Y999 Unspecified external cause status: Secondary | ICD-10-CM | POA: Insufficient documentation

## 2017-01-28 DIAGNOSIS — Z952 Presence of prosthetic heart valve: Secondary | ICD-10-CM | POA: Diagnosis not present

## 2017-01-28 DIAGNOSIS — Y939 Activity, unspecified: Secondary | ICD-10-CM | POA: Diagnosis not present

## 2017-01-28 DIAGNOSIS — I5043 Acute on chronic combined systolic (congestive) and diastolic (congestive) heart failure: Secondary | ICD-10-CM | POA: Diagnosis not present

## 2017-01-28 DIAGNOSIS — S00401A Unspecified superficial injury of right ear, initial encounter: Secondary | ICD-10-CM | POA: Diagnosis present

## 2017-01-28 DIAGNOSIS — Z7901 Long term (current) use of anticoagulants: Secondary | ICD-10-CM | POA: Diagnosis not present

## 2017-01-28 DIAGNOSIS — Z23 Encounter for immunization: Secondary | ICD-10-CM | POA: Insufficient documentation

## 2017-01-28 DIAGNOSIS — S0990XA Unspecified injury of head, initial encounter: Secondary | ICD-10-CM | POA: Insufficient documentation

## 2017-01-28 MED ORDER — LIDOCAINE HCL (PF) 1 % IJ SOLN
5.0000 mL | Freq: Once | INTRAMUSCULAR | Status: AC
  Administered 2017-01-28: 5 mL via INTRADERMAL
  Filled 2017-01-28: qty 5

## 2017-01-28 MED ORDER — LIDOCAINE HCL (PF) 1 % IJ SOLN: 5.0000 mL | Freq: Once | INTRAMUSCULAR | Status: DC

## 2017-01-28 MED ORDER — TETANUS-DIPHTH-ACELL PERTUSSIS 5-2.5-18.5 LF-MCG/0.5 IM SUSP
0.5000 mL | Freq: Once | INTRAMUSCULAR | Status: AC
  Administered 2017-01-28: 0.5 mL via INTRAMUSCULAR
  Filled 2017-01-28: qty 0.5

## 2017-01-28 NOTE — ED Notes (Signed)
Patient transported to CT 

## 2017-01-28 NOTE — ED Provider Notes (Signed)
[facility] DEPT [facility] Provider Note   CSN: [id number] Arrival date & time: 01/28/17  1353 By signing my name below, I, Adrian Lambert, attest that this documentation has been prepared under the direction and in the presence of [name], MD . Electronically Signed: Dyke Lambert, Scribe. 01/28/2017. 3:22 PM.   History   Chief Complaint Chief Complaint  Patient presents with  . Ear Laceration   HPI Adrian Lambert is a 76 y.o. male with a history of long-term anticoagulant use who presents to the Emergency Department complaining of laceration to right ear sustained today PTA. Pt states he was helping his grandchildren move furniture when he struck his head, lacerating his right ear. He reports associated sudden onset, 8/10 pain to the area. Pt is anticoagulated on coumadin. Tetanus status unknown. He denies any other injuries.  He denies any LOC and has no other acute complaints at this time.   The history is provided by the patient. No language interpreter was used.    Past Medical History:  Diagnosis Date  . Anticoagulant long-term use   . Arthritis    "probably in my thumbs" (06/26/2012)  . Bifascicular block   . Chronic combined systolic and diastolic CHF (congestive heart failure) ([facility])    a. 01/2016 Echo: EF 45-50%, gr2 DD, inflat AK (poor acoustic windows);  b. 07/2016 Echo: EF 25-30%, mild LVH, PASP 22mmHg (pt in Afib).  . Complication of anesthesia    "wake up w/a start; hallucinations" (06/26/2012)  . Critical Aortic Stenosis    a. 03/2003 s/p SJM #25 mech prosthetic AoV;  b. 07/2016 Echo: EF 25-30% (in setting of AF), AoV area 1.72 cm^2 (VTI), 1.75 cm^2 (Vmean).  . Diverticulosis   . Gouty arthritis    "have had it in both feet, ankles, right knee" (06/26/2012)  . History of diverticulitis of colon   . History of kidney stones   . History of pancreatitis   . Hypertension   . Incomplete RBBB   . PAF (paroxysmal atrial fibrillation) ([facility])    a. CHA2DS2VASc =  4-->chronic coumadin in setting of mech AoV;  b. 07/2016 Recurrent AF RVR.    Patient Active Problem List   Diagnosis Date Noted  . AF (atrial fibrillation) ([place]) 11/28/2016  . Atrial fibrillation ([place]) 08/16/2016  . Acute on chronic combined systolic and diastolic CHF, NYHA class 3 ([place]) 08/15/2016  . Prostate cancer ([place]) 11/26/2012  . Cellulitis 06/26/2012  . S/P AVR (aortic valve replacement) 06/28/2011  . History of aortic stenosis 06/28/2011  . Bifascicular block 06/28/2011  . Essential hypertension 11/04/2007  . RHINITIS 11/04/2007  . GERD 11/04/2007  . COUGH 11/04/2007    Past Surgical History:  Procedure Laterality Date  . AORTIC VALVE REPLACEMENT  04-14-2003  DR Servando Snare   #11mm [facility] MECHNICAL PROSTHESIS  . ATRIAL FIBRILLATION ABLATION N/A 11/28/2016   Procedure: Atrial Fibrillation Ablation;  Surgeon: Constance Haw, MD;  Location: [facility];  Service: Cardiovascular;  Laterality: N/A;  . CARDIOVERSION N/A 08/18/2016   Procedure: CARDIOVERSION;  Surgeon: Thayer Headings, MD;  Location: [facility];  Service: Cardiovascular;  Laterality: N/A;  . CARDIOVERSION N/A 08/28/2016   Procedure: CARDIOVERSION;  Surgeon: Larey Dresser, MD;  Location: [facility];  Service: Cardiovascular;  Laterality: N/A;  . CATARACT EXTRACTION W/ INTRAOCULAR LENS  IMPLANT, BILATERAL  RIGHT 2010/  LEFT DEC 2013  . HEMICOLECTOMY  10/06/2003   Laparoscopic-assisted left hemicolectomy for diverticulitis  . LAPAROSCOPIC CHOLECYSTECTOMY  07-23-2006  . PROSTATE  BIOPSY  10/09/2012   gleason 3+4=7  . RADIOACTIVE SEED IMPLANT N/A 01/09/2013   Procedure: RADIOACTIVE SEED IMPLANT;  Surgeon: Franchot Gallo, MD;  Location: [facility];  Service: Urology;  Laterality: N/A;  . TEE WITHOUT CARDIOVERSION N/A 08/18/2016   Procedure: TRANSESOPHAGEAL ECHOCARDIOGRAM (TEE);  Surgeon: Thayer Headings, MD;  Location: [facility];  Service: Cardiovascular;  Laterality: N/A;  .  TRANSTHORACIC ECHOCARDIOGRAM  04-28-2008   MILD LVH / NORMAL LSF/ NORMAL AORTIC VALVE MECHANICAL PROSTHESIS FUNCTION/ MILD LAE/ EF 55-60%       Home Medications    Prior to Admission medications   Medication Sig Start Date End Date Taking? Authorizing Provider  allopurinol (ZYLOPRIM) 300 MG tablet Take 300 mg by mouth daily.     [provider]  aspirin 81 MG chewable tablet Chew 1 tablet (81 mg total) by mouth daily. 08/29/16   Clegg, Amy D, NP  colchicine 0.6 MG tablet Take 0.6 mg by mouth daily as needed (for gout).     [provider]  digoxin (LANOXIN) 0.125 MG tablet Take 1 tablet (0.125 mg total) by mouth daily. 08/29/16   Clegg, Amy D, NP  docusate sodium (COLACE) 100 MG capsule Take 100 mg by mouth daily.    [provider]  losartan (COZAAR) 25 MG tablet Take 0.5 tablets (12.5 mg total) by mouth at bedtime. 12/19/16 12/19/17  Larey Dresser, MD  metoprolol succinate (TOPROL-XL) 25 MG 24 hr tablet Take 0.5 tablets (12.5 mg total) by mouth daily. 12/19/16   Larey Dresser, MD  Psyllium (METAMUCIL PO) Take 2 capsules by mouth 2 (two) times daily.     [provider]  spironolactone (ALDACTONE) 25 MG tablet Take 0.5 tablets (12.5 mg total) by mouth daily after supper. Take 1/2 tab once daily 12/26/16   Sherran Needs, NP  tamsulosin (FLOMAX) 0.4 MG CAPS capsule Take 0.4 mg by mouth every other day.     [provider]  tolterodine (DETROL LA) 4 MG 24 hr capsule Take 4 mg by mouth daily.  11/16/14   [provider]  torsemide (DEMADEX) 20 MG tablet Take 1 tablet (20 mg total) by mouth PC lunch. 12/26/16   Sherran Needs, NP  warfarin (COUMADIN) 5 MG tablet Take 0.5-1 tablets (2.5-5 mg total) by mouth See admin instructions. Take 5 mg by mouth Monday, Wednesday, Friday and Sunday. Take 2.5 mg by mouth on all other days. Patient taking differently: Take 2.5-5 mg by mouth See admin instructions. Takes 5 mg on Tuesday, Thursday, and Saturday  and takes 2.5 mg (1/2) tab on Sun, Mon, Wed, and Fri 08/30/16   Darrick Grinder D, NP    Family History Family History  Problem Relation Age of Onset  . Heart failure Mother   . Diabetes Mother   . Prostate cancer Father     Social History Social History  Substance Use Topics  . Smoking status: Never Smoker  . Smokeless tobacco: Never Used  . Alcohol use 1.8 oz/week    3 Cans of beer per week     Comment:  "may have 2-3 beers on the weekend"     Allergies   Celecoxib   Review of Systems Review of Systems All systems reviewed and are negative for acute change except as noted in the HPI.   Physical Exam Updated Vital Signs BP 114/70 (BP Location: Right Arm)   Pulse 84   Temp 98.5 F (36.9 C) (Oral)   Resp 18  Ht 5\' 10"  (1.778 m)   Wt 83.9 kg (185 lb)   SpO2 98%   BMI 26.54 kg/m   Physical Exam  Constitutional: He is oriented to person, place, and time. He appears well-developed and well-nourished. No distress.  HENT:  Head: Normocephalic.  Laceration through his ear to mid right ear  Eyes: Conjunctivae are normal.  Cardiovascular: Normal rate.   Pulmonary/Chest: Effort normal.  Abdominal: He exhibits no distension.  Neurological: He is alert and oriented to person, place, and time.  Skin: Skin is warm and dry.  Nursing note and vitals reviewed.   ED Treatments / Results  DIAGNOSTIC STUDIES:  Oxygen Saturation is 98% on RA, normal by my interpretation.    COORDINATION OF CARE:  3:20 PM Discussed treatment plan with pt at bedside and pt agreed to plan.   Labs (all labs ordered are listed, but only abnormal results are displayed) Labs Reviewed - No data to display  EKG  EKG Interpretation None       Radiology Ct Head Wo Contrast  Result Date: 01/28/2017 CLINICAL DATA:  Laceration to right ear.  On Coumadin. EXAM: CT HEAD WITHOUT CONTRAST TECHNIQUE: Contiguous axial images were obtained from the base of the skull through the vertex without  intravenous contrast. COMPARISON:  None. FINDINGS: Brain: Cerebral and cerebellar volume loss. No mass lesion, hemorrhage, hydrocephalus, acute infarct, intra-axial, or extra-axial fluid collection. Vascular: Intracranial atherosclerosis. Skull: soft tissue injury about the right ear including on image 16/series 3. No other soft tissue swelling identified. No skull fracture. Sinuses/Orbits: Normal imaged portions of the orbits and globes. Right ethmoid air cell mucosal thickening. Clear mastoid air cells. Other: None. IMPRESSION: 1.  No acute intracranial abnormality. 2. Cerebral and cerebellar atrophy. Electronically Signed   By: Abigail Miyamoto M.D.   On: 01/28/2017 16:10    Procedures Procedures (including critical care time)  Medications Ordered in ED Medications  lidocaine (PF) (XYLOCAINE) 1 % injection 5 mL (not administered)  lidocaine (PF) (XYLOCAINE) 1 % injection 5 mL (5 mLs Intradermal Given by Other 01/28/17 1515)  Tdap (BOOSTRIX) injection 0.5 mL (0.5 mLs Intramuscular Given 01/28/17 1515)     Initial Impression / Assessment and Plan / ED Course  I have reviewed the triage vital signs and the nursing notes.  Pertinent labs & imaging results that were available during my care of the patient were reviewed by me and considered in my medical decision making (see chart for details).   Laceration repaired by PA Kirichenko.  Pt tolerated well.  He is to hold coumadin tonight.  Return for any concerns.  Final Clinical Impressions(s) / ED Diagnoses   Final diagnoses:  Ear lobe laceration, right, initial encounter    New Prescriptions New Prescriptions   No medications on file   I personally performed the services described in this documentation, which was scribed in my presence. The recorded information has been reviewed and is accurate.    [name], MD 01/28/17 [phone]

## 2017-01-28 NOTE — ED Provider Notes (Signed)
LACERATION REPAIR Performed by: Renold Genta Authorized by: Jeannett Senior A Consent: Verbal consent obtained. Risks and benefits: risks, benefits and alternatives were discussed Consent given by: patient Patient identity confirmed: provided demographic data Prepped and Draped in normal sterile fashion Wound explored  Laceration Location: right ear  Laceration Length: 4cm  No Foreign Bodies seen or palpated  Anesthesia: local infiltration  Local anesthetic: lidocaine 1% wo epinephrine  Anesthetic total: 3 ml  Irrigation method: syringe Amount of cleaning: standard  Skin closure: prolene 6.0  Number of sutures: 13  Technique: simple interrupted  Patient tolerance: Patient tolerated the procedure well with no immediate complications.    Jeannett Senior, PA-C 01/29/17 0002    [name], MD 01/29/17 [phone]

## 2017-01-28 NOTE — ED Triage Notes (Signed)
patient states that he was helping move some furniture and he hit his right ear - right hear is lacerated and bleeding

## 2017-01-28 NOTE — ED Notes (Signed)
ED Provider at bedside for suture repair 

## 2017-01-29 ENCOUNTER — Encounter (HOSPITAL_COMMUNITY)
Admission: RE | Admit: 2017-01-29 | Discharge: 2017-01-29 | Disposition: A | Discharge status: Home / Self Care | Payer: Medicare Other | Source: Ambulatory Visit | Attending: Cardiology | Admitting: Cardiology

## 2017-01-29 DIAGNOSIS — I5022 Chronic systolic (congestive) heart failure: Secondary | ICD-10-CM

## 2017-01-31 ENCOUNTER — Encounter (HOSPITAL_COMMUNITY)
Admission: RE | Admit: 2017-01-31 | Discharge: 2017-01-31 | Disposition: A | Discharge status: Home / Self Care | Payer: Medicare Other | Source: Ambulatory Visit | Attending: Cardiology | Admitting: Cardiology

## 2017-01-31 DIAGNOSIS — I5022 Chronic systolic (congestive) heart failure: Secondary | ICD-10-CM

## 2017-02-02 ENCOUNTER — Encounter (HOSPITAL_COMMUNITY)
Admission: RE | Admit: 2017-02-02 | Discharge: 2017-02-02 | Disposition: A | Discharge status: Home / Self Care | Payer: Medicare Other | Source: Ambulatory Visit | Attending: Cardiology | Admitting: Cardiology

## 2017-02-02 DIAGNOSIS — I5022 Chronic systolic (congestive) heart failure: Secondary | ICD-10-CM

## 2017-02-02 NOTE — Addendum Note (Signed)
Addendum  created 02/02/17 1236 by Lyndle Herrlich, MD   Sign clinical note

## 2017-02-02 NOTE — Anesthesia Postprocedure Evaluation (Signed)
Anesthesia Post Note  Patient: Adrian Lambert  Procedure(s) Performed: Procedure(s) (LRB): Atrial Fibrillation Ablation (N/A)     Anesthesia Post Evaluation  Last Vitals:  Vitals:   11/28/16 2128 11/29/16 0339  BP: (!) 91/59 93/60  Pulse: (!) 57 (!) 56  Resp: 12 14  Temp: 36.4 C 36.7 C    Last Pain:  Vitals:   11/29/16 0734  TempSrc:   PainSc: 0-No pain                 Exavier Lina EDWARD

## 2017-02-05 ENCOUNTER — Encounter (HOSPITAL_COMMUNITY)
Admission: RE | Admit: 2017-02-05 | Discharge: 2017-02-05 | Disposition: A | Discharge status: Home / Self Care | Payer: Medicare Other | Source: Ambulatory Visit | Attending: Cardiology | Admitting: Cardiology

## 2017-02-05 DIAGNOSIS — I5022 Chronic systolic (congestive) heart failure: Secondary | ICD-10-CM

## 2017-02-05 DIAGNOSIS — S01311A Laceration without foreign body of right ear, initial encounter: Secondary | ICD-10-CM | POA: Diagnosis not present

## 2017-02-06 NOTE — Progress Notes (Signed)
Cardiac Individual Treatment Plan  Patient Details  Name: Adrian Lambert MRN: [id number] Date of Birth: [birth year] Referring Provider:     CARDIAC REHAB PHASE II ORIENTATION from 11/16/2016 in [place]  Referring Provider  [name] MD      Initial Encounter Date:    CARDIAC REHAB PHASE II ORIENTATION from 11/16/2016 in [place]  Date  11/16/16  Referring Provider  [name] MD      Visit Diagnosis: Heart failure, chronic systolic ([place])  Patient's Home Medications on Admission:  Current Outpatient Prescriptions:  .  allopurinol (ZYLOPRIM) 300 MG tablet, Take 300 mg by mouth daily. , Disp: , Rfl:  .  aspirin 81 MG chewable tablet, Chew 1 tablet (81 mg total) by mouth daily., Disp: 30 tablet, Rfl: 6 .  colchicine 0.6 MG tablet, Take 0.6 mg by mouth daily as needed (for gout). , Disp: , Rfl:  .  digoxin (LANOXIN) 0.125 MG tablet, Take 1 tablet (0.125 mg total) by mouth daily., Disp: 30 tablet, Rfl: 6 .  docusate sodium (COLACE) 100 MG capsule, Take 100 mg by mouth daily., Disp: , Rfl:  .  losartan (COZAAR) 25 MG tablet, Take 0.5 tablets (12.5 mg total) by mouth at bedtime., Disp: 30 tablet, Rfl: 11 .  metoprolol succinate (TOPROL-XL) 25 MG 24 hr tablet, Take 0.5 tablets (12.5 mg total) by mouth daily., Disp: 30 tablet, Rfl: 6 .  Psyllium (METAMUCIL PO), Take 2 capsules by mouth 2 (two) times daily. , Disp: , Rfl:  .  spironolactone (ALDACTONE) 25 MG tablet, Take 0.5 tablets (12.5 mg total) by mouth daily after supper. Take 1/2 tab once daily, Disp: , Rfl:  .  tamsulosin (FLOMAX) 0.4 MG CAPS capsule, Take 0.4 mg by mouth every other day. , Disp: , Rfl:  .  tolterodine (DETROL LA) 4 MG 24 hr capsule, Take 4 mg by mouth daily. , Disp: , Rfl: 0 .  torsemide (DEMADEX) 20 MG tablet, Take 1 tablet (20 mg total) by mouth PC lunch., Disp: , Rfl:  .  warfarin (COUMADIN) 5 MG tablet, Take 0.5-1 tablets (2.5-5 mg total)  by mouth See admin instructions. Take 5 mg by mouth Monday, Wednesday, Friday and Sunday. Take 2.5 mg by mouth on all other days. (Patient taking differently: Take 2.5-5 mg by mouth See admin instructions. Takes 5 mg on Tuesday, Thursday, and Saturday and takes 2.5 mg (1/2) tab on Sun, Mon, Wed, and Fri), Disp: 45 tablet, Rfl: 6  Past Medical History: Past Medical History:  Diagnosis Date  . Anticoagulant long-term use   . Arthritis    "probably in my thumbs" (06/26/2012)  . Bifascicular block   . Chronic combined systolic and diastolic CHF (congestive heart failure) ([place])    a. 01/2016 Echo: EF 45-50%, gr2 DD, inflat AK (poor acoustic windows);  b. 07/2016 Echo: EF 25-30%, mild LVH, PASP 12mmHg (pt in Afib).  . Complication of anesthesia    "wake up w/a start; hallucinations" (06/26/2012)  . Critical Aortic Stenosis    a. 03/2003 s/p SJM #25 mech prosthetic AoV;  b. 07/2016 Echo: EF 25-30% (in setting of AF), AoV area 1.72 cm^2 (VTI), 1.75 cm^2 (Vmean).  . Diverticulosis   . Gouty arthritis    "have had it in both feet, ankles, right knee" (06/26/2012)  . History of diverticulitis of colon   . History of kidney stones   . History of pancreatitis   . Hypertension   .  Incomplete RBBB   . PAF (paroxysmal atrial fibrillation) ([facility])    a. CHA2DS2VASc = 4-->chronic coumadin in setting of mech AoV;  b. 07/2016 Recurrent AF RVR.    Tobacco Use: History  Smoking Status  . Never Smoker  Smokeless Tobacco  . Never Used    Labs: Recent Review Flowsheet Data    Labs for ITP Cardiac and Pulmonary Rehab Latest Ref Rng & Units 08/25/2016 08/26/2016 08/27/2016 08/28/2016 08/29/2016   TCO2 0 - 100 mmol/L - - - - -   O2SAT % 75.7 60.7 57.9 53.0 71.6      Capillary Blood Glucose: Lab Results  Component Value Date   GLUCAP 123 (H) 01/09/2013     Exercise Target Goals:    Exercise Program Goal: Individual exercise prescription set with THRR, safety & activity barriers. Participant demonstrates ability  to understand and report RPE using BORG scale, to self-measure pulse accurately, and to acknowledge the importance of the exercise prescription.  Exercise Prescription Goal: Starting with aerobic activity 30 plus minutes a day, 3 days per week for initial exercise prescription. Provide home exercise prescription and guidelines that participant acknowledges understanding prior to discharge.  Activity Barriers & Risk Stratification:     Activity Barriers & Cardiac Risk Stratification - 11/16/16 0810      Activity Barriers & Cardiac Risk Stratification   Activity Barriers Deconditioning;Muscular Weakness;Balance Concerns;History of Falls   Cardiac Risk Stratification High      6 Minute Walk:     6 Minute Walk    Row Name 11/16/16 1327         6 Minute Walk   Phase Initial     Distance 1046 feet     Walk Time 6 minutes     # of Rest Breaks 0     MPH 1.98     METS 1.75     RPE 12     VO2 Peak 6.13     Symptoms No     Resting HR 70 bpm     Resting BP 114/70     Max Ex. HR 77 bpm     Max Ex. BP 110/62     2 Minute Post BP 108/64        Oxygen Initial Assessment:   Oxygen Re-Evaluation:   Oxygen Discharge (Final Oxygen Re-Evaluation):   Initial Exercise Prescription:     Initial Exercise Prescription - 11/16/16 1300      Date of Initial Exercise RX and Referring Provider   Date 11/16/16   Referring Provider [name] MD     Treadmill   MPH 1.7   Grade 0   Minutes 10   METs 2.3     Recumbant Bike   Level 1.5   Minutes 10   METs 1     NuStep   Level 2   SPM 70   Minutes 10   METs 2     Prescription Details   Frequency (times per week) 3   Duration Progress to 30 minutes of continuous aerobic without signs/symptoms of physical distress     Intensity   THRR 40-80% of Max Heartrate 58-116   Ratings of Perceived Exertion 11-13   Perceived Dyspnea 0-4     Progression   Progression Continue to progress workloads to maintain intensity without  signs/symptoms of physical distress.     Resistance Training   Training Prescription Yes   Weight 2lbs   Reps 10-15      Perform Capillary Blood Glucose  checks as needed.  Exercise Prescription Changes:     Exercise Prescription Changes    Row Name 11/27/16 1000 12/13/16 1000 12/26/16 1200 01/19/17 1528 02/01/17 1500     Response to Exercise   Blood Pressure (Admit) 94/60 95/60 104/70 98/64 106/67   Blood Pressure (Exercise) 126/64 118/70 122/74 118/50 110/60   Blood Pressure (Exit) 102/62 102/62 90/56  recheck after water 99/61 94/60 104/72  recheck after water 99/61   Heart Rate (Admit) 70 bpm 83 bpm 79 bpm 84 bpm 88 bpm   Heart Rate (Exercise) 87 bpm 100 bpm 99 bpm 107 bpm 107 bpm   Heart Rate (Exit) 67 bpm 76 bpm 79 bpm 84 bpm 88 bpm   Rating of Perceived Exertion (Exercise) 13 12 13 13 12    Symptoms none none none none none   Comments pt was oriented to exercise equipment on 11/20/16. Pt responded well to first week of cardiac rehab pt was oriented to exercise equipment on 11/20/16. Pt responded well to first week of cardiac rehab pt was oriented to exercise equipment on 11/20/16. Pt responded well to first week of cardiac rehab  -  -   Duration Continue with 30 min of aerobic exercise without signs/symptoms of physical distress. Continue with 30 min of aerobic exercise without signs/symptoms of physical distress. Continue with 30 min of aerobic exercise without signs/symptoms of physical distress. Continue with 30 min of aerobic exercise without signs/symptoms of physical distress. Continue with 30 min of aerobic exercise without signs/symptoms of physical distress.   Intensity THRR unchanged THRR unchanged THRR unchanged THRR unchanged THRR unchanged     Progression   Progression Continue to progress workloads to maintain intensity without signs/symptoms of physical distress. Continue to progress workloads to maintain intensity without signs/symptoms of physical distress.  Continue to progress workloads to maintain intensity without signs/symptoms of physical distress. Continue to progress workloads to maintain intensity without signs/symptoms of physical distress. Continue to progress workloads to maintain intensity without signs/symptoms of physical distress.   Average METs 2.9 3 3.1 3.3 3.3     Resistance Training   Training Prescription Yes Yes Yes Yes Yes   Weight 3lbs 3lbs 3lbs 5lbs 4lbs   Reps 10-15 10-15 10-15 10-15 10-15   Time 10 Minutes 10 Minutes 10 Minutes 10 Minutes 10 Minutes     Treadmill   MPH 2 2.1 2.1 2.5 2.5   Grade 0 1 1 1 1    Minutes 10 10 10 10 10    METs 2.53 2.9 2.9 3.26 3.26     Recumbant Bike   Level 2 2.5 2.5 2.5 2.5   Minutes 10 10 10 10 10    METs 2.8 2.8 2.8 2.8 2.8     NuStep   Level 3 3 3 4 4    SPM 80 80 90 90 90   Minutes 10 10 10 10 10    METs 3.5 3.2 3.5 3.9 3.7     Home Exercise Plan   Plans to continue exercise at  - Home (comment)  on treadmill 2(15' bouts) Home (comment)  on treadmill 2(15' bouts) Home (comment)  treadmill 2(15'bouts) Home (comment)  on treadmill 2(15' bouts)   Frequency  - Add 2 additional days to program exercise sessions. Add 2 additional days to program exercise sessions. Add 2 additional days to program exercise sessions. Add 2 additional days to program exercise sessions.   Initial Home Exercises Provided  - 12/11/16 12/11/16 12/11/16 12/11/16      Exercise Comments:  Exercise Comments    Row Name 12/13/16 1033 01/08/17 1011 02/01/17 1532       Exercise Comments Reviewed METs and goals. Pt is tolerating exercise well; will continue to monitor pt's exercise progression Reviewed METs and goals. Pt is tolerating exercise well; will continue to monitor pt's exercise progression Reviewed METs and goals. Pt is tolerating exercise well; will continue to monitor pt's exercise progression        Exercise Goals and Review:     Exercise Goals    Row Name 11/16/16 0810              Exercise Goals   Increase Physical Activity Yes       Intervention Provide advice, education, support and counseling about physical activity/exercise needs.;Develop an individualized exercise prescription for aerobic and resistive training based on initial evaluation findings, risk stratification, comorbidities and participant's personal goals.       Expected Outcomes Achievement of increased cardiorespiratory fitness and enhanced flexibility, muscular endurance and strength shown through measurements of functional capacity and personal statement of participant.       Increase Strength and Stamina Yes       Intervention Provide advice, education, support and counseling about physical activity/exercise needs.;Develop an individualized exercise prescription for aerobic and resistive training based on initial evaluation findings, risk stratification, comorbidities and participant's personal goals.  get back to hiking and fishing. Learn how to exercise safely.       Expected Outcomes Achievement of increased cardiorespiratory fitness and enhanced flexibility, muscular endurance and strength shown through measurements of functional capacity and personal statement of participant.          Exercise Goals Re-Evaluation :     Exercise Goals Re-Evaluation    Row Name 12/11/16 1627 01/01/17 1732 01/08/17 1011 02/01/17 1532       Exercise Goal Re-Evaluation   Exercise Goals Review Increase Physical Activity;Increase Strenth and Stamina Increase Physical Activity;Increase Strenth and Stamina  -  -    Comments Reviewed home exercise with pt today.  Pt plans to walk on treadmill (2, 15 minute bouts) for exercise 1-2x/week in addition to coming to cardiac rehab.  Reviewed THR, pulse, RPE, sign and symptoms, and when to call 911 or MD.  Also discussed weather considerations and indoor options.  Pt voiced understanding. HEP is walking at home.  pt reports increased ability to climb steps adn walk longer  distances without dyspea.   HEP is walking at home.  pt reports increased ability to climb steps and walk longer distances without dyspnea.  Pt also struggles with balance. Gave HO on balance exercises. Pt is compliant with HEP and is doing balance exercises. Pt is also increasing in strength and actvity tolerance in cardiac rehab.    Expected Outcomes Pt will be compliant with HEP and improve in cardiorespiratory fitness Pt will be compliant with HEP and improve in cardiorespiratory fitness Pt will be compliant with HEP and improve in cardiorespiratory fitness and balance Pt will be compliant with HEP and improve in cardiorespiratory fitness and balance        Discharge Exercise Prescription (Final Exercise Prescription Changes):     Exercise Prescription Changes - 02/01/17 1500      Response to Exercise   Blood Pressure (Admit) 106/67   Blood Pressure (Exercise) 110/60   Blood Pressure (Exit) 104/72  recheck after water 99/61   Heart Rate (Admit) 88 bpm   Heart Rate (Exercise) 107 bpm   Heart Rate (Exit) 88 bpm  Rating of Perceived Exertion (Exercise) 12   Symptoms none   Duration Continue with 30 min of aerobic exercise without signs/symptoms of physical distress.   Intensity THRR unchanged     Progression   Progression Continue to progress workloads to maintain intensity without signs/symptoms of physical distress.   Average METs 3.3     Resistance Training   Training Prescription Yes   Weight 4lbs   Reps 10-15   Time 10 Minutes     Treadmill   MPH 2.5   Grade 1   Minutes 10   METs 3.26     Recumbant Bike   Level 2.5   Minutes 10   METs 2.8     NuStep   Level 4   SPM 90   Minutes 10   METs 3.7     Home Exercise Plan   Plans to continue exercise at Home (comment)  on treadmill 2(15' bouts)   Frequency Add 2 additional days to program exercise sessions.   Initial Home Exercises Provided 12/11/16      Nutrition:  Target Goals: Understanding of nutrition  guidelines, daily intake of sodium 1500mg , cholesterol 200mg , calories 30% from fat and 7% or less from saturated fats, daily to have 5 or more servings of fruits and vegetables.  Biometrics:     Pre Biometrics - 11/16/16 1328      Pre Biometrics   Waist Circumference 42 inches   Hip Circumference 40 inches   Waist to Hip Ratio 1.05 %   Triceps Skinfold 14 mm   % Body Fat 27.7 %   Grip Strength 35 kg   Flexibility 9.25 in   Single Leg Stand 1.65 seconds       Nutrition Therapy Plan and Nutrition Goals:   Nutrition Discharge: Nutrition Scores:   Nutrition Goals Re-Evaluation:   Nutrition Goals Re-Evaluation:   Nutrition Goals Discharge (Final Nutrition Goals Re-Evaluation):   Psychosocial: Target Goals: Acknowledge presence or absence of significant depression and/or stress, maximize coping skills, provide positive support system. Participant is able to verbalize types and ability to use techniques and skills needed for reducing stress and depression.  Initial Review & Psychosocial Screening:     Initial Psych Review & Screening - 11/16/16 1608      Initial Review   Current issues with None Identified     Family Dynamics   Good Support System? Yes     Barriers   Psychosocial barriers to participate in program There are no identifiable barriers or psychosocial needs.     Screening Interventions   Interventions Encouraged to exercise      Quality of Life Scores:     Quality of Life - 11/24/16 1030      Quality of Life Scores   Health/Function Pre 24.47 %  pt concerned about fatigue, decreased stamina.  pt is concerned symptoms are medication related. pt scheduled for ablation 11/28/2016.  pt has discussed his concerns with Dr. Aundra Dubin.    Socioeconomic Pre 24.64 %   Psych/Spiritual Pre 30 %   Family Pre 26.4 %   GLOBAL Pre 25.93 %  overall scores good. pt encouraged continued CR participation will likely decrease symptoms, in addition to ablation  intervention for arrhythmia control.   pt offered emotional support and reassurance.       PHQ-9: Recent Review Flowsheet Data    Depression screen [facility] 2/9 11/20/2016   Decreased Interest 0   Down, Depressed, Hopeless 0   PHQ - 2 Score 0  Interpretation of Total Score  Total Score Depression Severity:  1-4 = Minimal depression, 5-9 = Mild depression, 10-14 = Moderate depression, 15-19 = Moderately severe depression, 20-27 = Severe depression   Psychosocial Evaluation and Intervention:     Psychosocial Evaluation - 11/20/16 0913      Psychosocial Evaluation & Interventions   Interventions Encouraged to exercise with the program and follow exercise prescription   Comments no psychosocial needs identified, no interventions necessary.    Expected Outcomes pt will demonstrate positive hopeful outlook with good coping skills.    Continue Psychosocial Services  No Follow up required      Psychosocial Re-Evaluation:     Psychosocial Re-Evaluation    [place] Name 01/01/17 1731 02/05/17 1215           Psychosocial Re-Evaluation   Current issues with None Identified None Identified      Comments no psychosocial needs identified, no interventions necessary  no psychosocial needs identified, no interventions necessary       Expected Outcomes pt will exhibit positive outlook with good coping skills.  pt will exhibit positive outlook with good coping skills.       Interventions Encouraged to attend Cardiac Rehabilitation for the exercise Encouraged to attend Cardiac Rehabilitation for the exercise      Continue Psychosocial Services  No Follow up required No Follow up required         Psychosocial Discharge (Final Psychosocial Re-Evaluation):     Psychosocial Re-Evaluation - 02/05/17 1215      Psychosocial Re-Evaluation   Current issues with None Identified   Comments no psychosocial needs identified, no interventions necessary    Expected Outcomes pt will exhibit positive  outlook with good coping skills.    Interventions Encouraged to attend Cardiac Rehabilitation for the exercise   Continue Psychosocial Services  No Follow up required      Vocational Rehabilitation: Provide vocational rehab assistance to qualifying candidates.   Vocational Rehab Evaluation & Intervention:     Vocational Rehab - 11/16/16 1610      Initial Vocational Rehab Evaluation & Intervention   Assessment shows need for Vocational Rehabilitation No      Education: Education Goals: Education classes will be provided on a weekly basis, covering required topics. Participant will state understanding/return demonstration of topics presented.  Learning Barriers/Preferences:     Learning Barriers/Preferences - 11/16/16 3244      Learning Barriers/Preferences   Learning Barriers None   Learning Preferences Written Material;Video;Verbal Instruction;Skilled Demonstration      Education Topics: Count Your Pulse:  -Group instruction provided by verbal instruction, demonstration, patient participation and written materials to support subject.  Instructors address importance of being able to find your pulse and how to count your pulse when at home without a heart monitor.  Patients get hands on experience counting their pulse with staff help and individually.   CARDIAC REHAB PHASE II EXERCISE from 02/02/2017 in [place]  Date  01/26/17  Educator  RN  Instruction Review Code  R- Review/reinforce      Heart Attack, Angina, and Risk Factor Modification:  -Group instruction provided by verbal instruction, video, and written materials to support subject.  Instructors address signs and symptoms of angina and heart attacks.    Also discuss risk factors for heart disease and how to make changes to improve heart health risk factors.   Functional Fitness:  -Group instruction provided by verbal instruction, demonstration, patient participation, and written  materials to  support subject.  Instructors address safety measures for doing things around the house.  Discuss how to get up and down off the floor, how to pick things up properly, how to safely get out of a chair without assistance, and balance training.   CARDIAC REHAB PHASE II EXERCISE from 02/02/2017 in [place]  Date  01/05/17  Instruction Review Code  2- meets goals/outcomes      Meditation and Mindfulness:  -Group instruction provided by verbal instruction, patient participation, and written materials to support subject.  Instructor addresses importance of mindfulness and meditation practice to help reduce stress and improve awareness.  Instructor also leads participants through a meditation exercise.    CARDIAC REHAB PHASE II EXERCISE from 02/02/2017 in [place]  Date  12/27/16  Instruction Review Code  2- meets goals/outcomes      Stretching for Flexibility and Mobility:  -Group instruction provided by verbal instruction, patient participation, and written materials to support subject.  Instructors lead participants through series of stretches that are designed to increase flexibility thus improving mobility.  These stretches are additional exercise for major muscle groups that are typically performed during regular warm up and cool down.   CARDIAC REHAB PHASE II EXERCISE from 02/02/2017 in [place]  Date  01/12/17  Instruction Review Code  2- meets goals/outcomes      Hands Only CPR:  -Group verbal, video, and participation provides a basic overview of AHA guidelines for community CPR. Role-play of emergencies allow participants the opportunity to practice calling for help and chest compression technique with discussion of AED use.   Hypertension: -Group verbal and written instruction that provides a basic overview of hypertension including the most recent diagnostic guidelines,  risk factor reduction with self-care instructions and medication management.   CARDIAC REHAB PHASE II EXERCISE from 02/02/2017 in [place]  Date  12/29/16  Instruction Review Code  2- meets goals/outcomes       Nutrition I class: Heart Healthy Eating:  -Group instruction provided by PowerPoint slides, verbal discussion, and written materials to support subject matter. The instructor gives an explanation and review of the Therapeutic Lifestyle Changes diet recommendations, which includes a discussion on lipid goals, dietary fat, sodium, fiber, plant stanol/sterol esters, sugar, and the components of a well-balanced, healthy diet.   Nutrition II class: Lifestyle Skills:  -Group instruction provided by PowerPoint slides, verbal discussion, and written materials to support subject matter. The instructor gives an explanation and review of label reading, grocery shopping for heart health, heart healthy recipe modifications, and ways to make healthier choices when eating out.   Diabetes Question & Answer:  -Group instruction provided by PowerPoint slides, verbal discussion, and written materials to support subject matter. The instructor gives an explanation and review of diabetes co-morbidities, pre- and post-prandial blood glucose goals, pre-exercise blood glucose goals, signs, symptoms, and treatment of hypoglycemia and hyperglycemia, and foot care basics.   CARDIAC REHAB PHASE II EXERCISE from 02/02/2017 in [place]  Date  02/02/17  Educator  RD  Instruction Review Code  2- meets goals/outcomes      Diabetes Blitz:  -Group instruction provided by PowerPoint slides, verbal discussion, and written materials to support subject matter. The instructor gives an explanation and review of the physiology behind type 1 and type 2 diabetes, diabetes medications and rational behind using different medications, pre- and post-prandial blood  glucose recommendations  and Hemoglobin A1c goals, diabetes diet, and exercise including blood glucose guidelines for exercising safely.    Portion Distortion:  -Group instruction provided by PowerPoint slides, verbal discussion, written materials, and food models to support subject matter. The instructor gives an explanation of serving size versus portion size, changes in portions sizes over the last 20 years, and what consists of a serving from each food group.   CARDIAC REHAB PHASE II EXERCISE from 02/02/2017 in [place]  Date  01/10/17  Educator  RD  Instruction Review Code  2- meets goals/outcomes      Stress Management:  -Group instruction provided by verbal instruction, video, and written materials to support subject matter.  Instructors review role of stress in heart disease and how to cope with stress positively.     CARDIAC REHAB PHASE II EXERCISE from 02/02/2017 in [place]  Date  01/17/17  Instruction Review Code  2- meets goals/outcomes      Exercising on Your Own:  -Group instruction provided by verbal instruction, power point, and written materials to support subject.  Instructors discuss benefits of exercise, components of exercise, frequency and intensity of exercise, and end points for exercise.  Also discuss use of nitroglycerin and activating EMS.  Review options of places to exercise outside of rehab.  Review guidelines for sex with heart disease.   Cardiac Drugs I:  -Group instruction provided by verbal instruction and written materials to support subject.  Instructor reviews cardiac drug classes: antiplatelets, anticoagulants, beta blockers, and statins.  Instructor discusses reasons, side effects, and lifestyle considerations for each drug class.   Cardiac Drugs II:  -Group instruction provided by verbal instruction and written materials to support subject.  Instructor reviews cardiac drug classes:  angiotensin converting enzyme inhibitors (ACE-I), angiotensin II receptor blockers (ARBs), nitrates, and calcium channel blockers.  Instructor discusses reasons, side effects, and lifestyle considerations for each drug class.   CARDIAC REHAB PHASE II EXERCISE from 02/02/2017 in [place]  Date  01/31/17  Educator  Kennyth Lose  Instruction Review Code  2- meets goals/outcomes      Anatomy and Physiology of the Circulatory System:  Group verbal and written instruction and models provide basic cardiac anatomy and physiology, with the coronary electrical and arterial systems. Review of: AMI, Angina, Valve disease, Heart Failure, Peripheral Artery Disease, Cardiac Arrhythmia, Pacemakers, and the ICD.   Other Education:  -Group or individual verbal, written, or video instructions that support the educational goals of the cardiac rehab program.   Knowledge Questionnaire Score:     Knowledge Questionnaire Score - 11/16/16 1326      Knowledge Questionnaire Score   Pre Score 23/24      Core Components/Risk Factors/Patient Goals at Admission:     Personal Goals and Risk Factors at Admission - 11/16/16 1337      Core Components/Risk Factors/Patient Goals on Admission    Weight Management Yes;Weight Loss;Weight Maintenance   Intervention Weight Management: Develop a combined nutrition and exercise program designed to reach desired caloric intake, while maintaining appropriate intake of nutrient and fiber, sodium and fats, and appropriate energy expenditure required for the weight goal.;Weight Management: Provide education and appropriate resources to help participant work on and attain dietary goals.;Weight Management/Obesity: Establish reasonable short term and long term weight goals.   Expected Outcomes Short Term: Continue to assess and modify interventions until short term weight is achieved;Long Term: Adherence to nutrition and physical activity/exercise program  aimed toward attainment of established weight goal;Weight Maintenance: Understanding of the daily nutrition guidelines, which includes 25-35% calories from fat, 7% or less cal from saturated fats, less than 200mg  cholesterol, less than 1.5gm of sodium, & 5 or more servings of fruits and vegetables daily;Weight Loss: Understanding of general recommendations for a balanced deficit meal plan, which promotes 1-2 lb weight loss per week and includes a negative energy balance of [phone] kcal/d;Understanding recommendations for meals to include 15-35% energy as protein, 25-35% energy from fat, 35-60% energy from carbohydrates, less than 200mg  of dietary cholesterol, 20-35 gm of total fiber daily;Understanding of distribution of calorie intake throughout the day with the consumption of 4-5 meals/snacks   Hypertension Yes   Intervention Provide education on lifestyle modifcations including regular physical activity/exercise, weight management, moderate sodium restriction and increased consumption of fresh fruit, vegetables, and low fat dairy, alcohol moderation, and smoking cessation.;Monitor prescription use compliance.   Expected Outcomes Short Term: Continued assessment and intervention until BP is < 140/57mm HG in hypertensive participants. < 130/25mm HG in hypertensive participants with diabetes, heart failure or chronic kidney disease.;Long Term: Maintenance of blood pressure at goal levels.   Personal Goal Other Yes   Personal Goal Learn better medication management   Intervention Provide cardiac education classes geared toward medication management and risk factor modification to assist with improving pt's overall quality of life   Expected Outcomes Pt have increased knowledge and understanding of medication and modifiable risk factors      Core Components/Risk Factors/Patient Goals Review:      Goals and Risk Factor Review    Row Name 12/13/16 1641 01/01/17 1731 02/05/17 1215         Core  Components/Risk Factors/Patient Goals Review   Personal Goals Review Weight Management/Obesity;Hypertension;Other Weight Management/Obesity;Hypertension;Other Weight Management/Obesity;Hypertension;Other     Review pt demonstrates eagerness to participate in CR activities.  pt demonstrates eagerness to participate in CR activities.  pt demonstrates eagerness to participate in CR activities.      Expected Outcomes pt will participate in CR exercise, nutrition and lifestyle education opportunities to decrease overall CAD risk factors.  pt will participate in CR exercise, nutrition and lifestyle education opportunities to decrease overall CAD risk factors.  pt will participate in CR exercise, nutrition and lifestyle education opportunities to decrease overall CAD risk factors.         Core Components/Risk Factors/Patient Goals at Discharge (Final Review):      Goals and Risk Factor Review - 02/05/17 1215      Core Components/Risk Factors/Patient Goals Review   Personal Goals Review Weight Management/Obesity;Hypertension;Other   Review pt demonstrates eagerness to participate in CR activities.    Expected Outcomes pt will participate in CR exercise, nutrition and lifestyle education opportunities to decrease overall CAD risk factors.       ITP Comments:     ITP Comments    Row Name 11/16/16 0809 01/09/17 1557 02/05/17 1215 02/06/17 1620     ITP Comments Medical Director, Dr. Fransico Him Medical Director, Dr. Fransico Him Medical Director, Dr. Fransico Him Medical Director, Dr. Fransico Him       Comments: Pt is making expected progress toward personal goals after completing 25 sessions. Recommend continued exercise and life style modification education including  stress management and relaxation techniques to decrease cardiac risk profile.

## 2017-02-07 ENCOUNTER — Other Ambulatory Visit: Payer: Self-pay | Admitting: Family

## 2017-02-07 ENCOUNTER — Encounter (HOSPITAL_COMMUNITY)
Admission: RE | Admit: 2017-02-07 | Discharge: 2017-02-07 | Disposition: A | Discharge status: Home / Self Care | Payer: Medicare Other | Source: Ambulatory Visit | Attending: Cardiology | Admitting: Cardiology

## 2017-02-07 DIAGNOSIS — D649 Anemia, unspecified: Secondary | ICD-10-CM

## 2017-02-07 DIAGNOSIS — I5022 Chronic systolic (congestive) heart failure: Secondary | ICD-10-CM | POA: Diagnosis not present

## 2017-02-08 ENCOUNTER — Ambulatory Visit: Payer: Medicare Other

## 2017-02-08 ENCOUNTER — Other Ambulatory Visit (HOSPITAL_BASED_OUTPATIENT_CLINIC_OR_DEPARTMENT_OTHER): Payer: Medicare Other

## 2017-02-08 ENCOUNTER — Ambulatory Visit (HOSPITAL_BASED_OUTPATIENT_CLINIC_OR_DEPARTMENT_OTHER): Payer: Medicare Other | Admitting: Family

## 2017-02-08 VITALS — BP 105/60 | HR 84 | Temp 98.6°F | Resp 18 | Wt 184.8 lb

## 2017-02-08 DIAGNOSIS — D649 Anemia, unspecified: Secondary | ICD-10-CM | POA: Diagnosis not present

## 2017-02-08 DIAGNOSIS — D500 Iron deficiency anemia secondary to blood loss (chronic): Secondary | ICD-10-CM

## 2017-02-08 DIAGNOSIS — Z952 Presence of prosthetic heart valve: Secondary | ICD-10-CM | POA: Diagnosis not present

## 2017-02-08 DIAGNOSIS — Z8042 Family history of malignant neoplasm of prostate: Secondary | ICD-10-CM | POA: Diagnosis not present

## 2017-02-08 DIAGNOSIS — D509 Iron deficiency anemia, unspecified: Secondary | ICD-10-CM

## 2017-02-08 DIAGNOSIS — Z7901 Long term (current) use of anticoagulants: Secondary | ICD-10-CM

## 2017-02-08 LAB — CMP (CANCER CENTER ONLY)
ALK PHOS: 128 U/L — AB (ref 26–84)
ALT(SGPT): 31 U/L (ref 10–47)
AST: 44 U/L — ABNORMAL HIGH (ref 11–38)
Albumin: 3.6 g/dL (ref 3.3–5.5)
BUN: 18 mg/dL (ref 7–22)
CALCIUM: 9.2 mg/dL (ref 8.0–10.3)
CHLORIDE: 102 meq/L (ref 98–108)
CO2: 31 mEq/L (ref 18–33)
Creat: 0.9 mg/dl (ref 0.6–1.2)
GLUCOSE: 126 mg/dL — AB (ref 73–118)
POTASSIUM: 3.8 meq/L (ref 3.3–4.7)
Sodium: 137 mEq/L (ref 128–145)
Total Bilirubin: 1.6 mg/dl (ref 0.20–1.60)
Total Protein: 6.5 g/dL (ref 6.4–8.1)

## 2017-02-08 LAB — CBC WITH DIFFERENTIAL (CANCER CENTER ONLY)
BASO#: 0.1 10*3/uL (ref 0.0–0.2)
BASO%: 0.7 % (ref 0.0–2.0)
EOS ABS: 0.5 10*3/uL (ref 0.0–0.5)
EOS%: 4.3 % (ref 0.0–7.0)
HCT: 36 % — ABNORMAL LOW (ref 38.7–49.9)
HGB: 11.9 g/dL — ABNORMAL LOW (ref 13.0–17.1)
LYMPH#: 1.5 10*3/uL (ref 0.9–3.3)
LYMPH%: 14.1 % (ref 14.0–48.0)
MCH: 20.5 pg — AB (ref 28.0–33.4)
MCHC: 33.1 g/dL (ref 32.0–35.9)
MCV: 62 fL — AB (ref 82–98)
MONO#: 1 10*3/uL — AB (ref 0.1–0.9)
MONO%: 9 % (ref 0.0–13.0)
NEUT#: 7.8 10*3/uL — ABNORMAL HIGH (ref 1.5–6.5)
NEUT%: 71.9 % (ref 40.0–80.0)
RBC: 5.81 10*6/uL — ABNORMAL HIGH (ref 4.20–5.70)
RDW: 17.1 % — AB (ref 11.1–15.7)
WBC: 10.8 10*3/uL — AB (ref 4.0–10.0)

## 2017-02-08 LAB — CHCC SATELLITE - SMEAR

## 2017-02-08 NOTE — Progress Notes (Signed)
Hematology/Oncology Consultation   Name: Adrian Lambert      MRN: [id number]    Location: Room/bed info not found  Date: 02/08/2017 Time:4:08 PM   REFERRING PHYSICIAN: Hulan Fess, MD  REASON FOR CONSULT: Anemia   DIAGNOSIS: Iron deficiency anemia     HISTORY OF PRESENT ILLNESS: Adrian Lambert is a very pleasant 76 yo caucasian gentleman with recent anemia.  He was hospitalized with atrial fib in May and had an ablation where he converted back to sinus rhythm. He is in cardiac rehab 3 times a week and walks on a treadmill the other 2 days of the weak. He also enjoys working out in his garden.  He has an artificial aortic valve and takes both coumadin and aspirin daily.  He is symptomatic with fatigue and chewing ice.  No family history of bleeding or anemia that he is aware of.  He denies any episodes of bleeding, no petechiae. He does bruise easily on anticoagulation.  He states that his last colonoscopy was in 2010 and was negative. He is due again in 2020.  He has had no issue with infection. No fever, chills, n/v, cough, rash, dizziness, SOB, chest pain, palpitations, abdominal pain or changes in bowel or bladder habits.  No swelling, tenderness, numbness or tingling in her extremities. No c/o pain.  He has maintained a good appetite and is staying well hydrated. His weight is stable. He has history of prostate cancer in 2014 treated with radioactive seed implant. He has done well and states that his last PSA was normal.  His father also had history of prostate cancer.  He was never a smoker and does not drink alcoholic beverages.  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   ROS: All other 10 point review of systems is negative.   PAST MEDICAL HISTORY:   Past Medical History:  Diagnosis Date  . Anticoagulant long-term use   . Arthritis    "probably in my thumbs" (06/26/2012)   . Bifascicular block   . Chronic combined systolic and diastolic CHF (congestive heart failure) ([place])    a. 01/2016 Echo: EF 45-50%, gr2 DD, inflat AK (poor acoustic windows);  b. 07/2016 Echo: EF 25-30%, mild LVH, PASP 36mmHg (pt in Afib).  . Complication of anesthesia    "wake up w/a start; hallucinations" (06/26/2012)  . Critical Aortic Stenosis    a. 03/2003 s/p SJM #25 mech prosthetic AoV;  b. 07/2016 Echo: EF 25-30% (in setting of AF), AoV area 1.72 cm^2 (VTI), 1.75 cm^2 (Vmean).  . Diverticulosis   . Gouty arthritis    "have had it in both feet, ankles, right knee" (06/26/2012)  . History of diverticulitis of colon   . History of kidney stones   . History of pancreatitis   . Hypertension   . Incomplete RBBB   . PAF (paroxysmal atrial fibrillation) ([facility])    a. CHA2DS2VASc = 4-->chronic coumadin in setting of mech AoV;  b. 07/2016 Recurrent AF RVR.    ALLERGIES: Allergies  Allergen Reactions  . Celecoxib Rash and Other (See Comments)    Celebrex      MEDICATIONS:  Current Outpatient Prescriptions on File Prior to Visit  Medication Sig Dispense Refill  . allopurinol (ZYLOPRIM) 300 MG tablet Take 300 mg by mouth daily.     Marland Kitchen aspirin 81 MG chewable tablet Chew 1 tablet (  81 mg total) by mouth daily. 30 tablet 6  . colchicine 0.6 MG tablet Take 0.6 mg by mouth daily as needed (for gout).     Marland Kitchen digoxin (LANOXIN) 0.125 MG tablet Take 1 tablet (0.125 mg total) by mouth daily. 30 tablet 6  . docusate sodium (COLACE) 100 MG capsule Take 100 mg by mouth daily.    Marland Kitchen losartan (COZAAR) 25 MG tablet Take 0.5 tablets (12.5 mg total) by mouth at bedtime. 30 tablet 11  . metoprolol succinate (TOPROL-XL) 25 MG 24 hr tablet Take 0.5 tablets (12.5 mg total) by mouth daily. 30 tablet 6  . Psyllium (METAMUCIL PO) Take 2 capsules by mouth 2 (two) times daily.     Marland Kitchen spironolactone (ALDACTONE) 25 MG tablet Take 0.5 tablets (12.5 mg total) by mouth daily after supper. Take 1/2 tab once daily    .  tamsulosin (FLOMAX) 0.4 MG CAPS capsule Take 0.4 mg by mouth every other day.     . tolterodine (DETROL LA) 4 MG 24 hr capsule Take 4 mg by mouth daily.   0  . torsemide (DEMADEX) 20 MG tablet Take 1 tablet (20 mg total) by mouth PC lunch.    . warfarin (COUMADIN) 5 MG tablet Take 0.5-1 tablets (2.5-5 mg total) by mouth See admin instructions. Take 5 mg by mouth Monday, Wednesday, Friday and Sunday. Take 2.5 mg by mouth on all other days. (Patient taking differently: Take 2.5-5 mg by mouth See admin instructions. Takes 5 mg on Tuesday, Thursday, and Saturday and takes 2.5 mg (1/2) tab on Sun, Mon, Wed, and Fri) 45 tablet 6   No current facility-administered medications on file prior to visit.      PAST SURGICAL HISTORY Past Surgical History:  Procedure Laterality Date  . AORTIC VALVE REPLACEMENT  04-14-2003  DR Servando Snare   #64mm [facility] MECHNICAL PROSTHESIS  . ATRIAL FIBRILLATION ABLATION N/A 11/28/2016   Procedure: Atrial Fibrillation Ablation;  Surgeon: Constance Haw, MD;  Location: [place] [facility];  Service: Cardiovascular;  Laterality: N/A;  . CARDIOVERSION N/A 08/18/2016   Procedure: CARDIOVERSION;  Surgeon: Thayer Headings, MD;  Location[facility];  Service: Cardiovascular;  Laterality: N/A;  . CARDIOVERSION N/A 08/28/2016   Procedure: CARDIOVERSION;  Surgeon: Larey Dresser, MD;  Location: [place];  Service: Cardiovascular;  Laterality: N/A;  . CATARACT EXTRACTION W/ INTRAOCULAR LENS  IMPLANT, BILATERAL  RIGHT 2010/  LEFT DEC 2013  . HEMICOLECTOMY  10/06/2003   Laparoscopic-assisted left hemicolectomy for diverticulitis  . LAPAROSCOPIC CHOLECYSTECTOMY  07-23-2006  . PROSTATE BIOPSY  10/09/2012   gleason 3+4=7  . RADIOACTIVE SEED IMPLANT N/A 01/09/2013   Procedure: RADIOACTIVE SEED IMPLANT;  Surgeon: Franchot Gallo, MD;  Location[facility];  Service: Urology;  Laterality: N/A;  . TEE WITHOUT CARDIOVERSION N/A 08/18/2016   Procedure: TRANSESOPHAGEAL  ECHOCARDIOGRAM (TEE);  Surgeon: Thayer Headings, MD;  Location: [place];  Service: Cardiovascular;  Laterality: N/A;  . TRANSTHORACIC ECHOCARDIOGRAM  04-28-2008   MILD LVH / NORMAL LSF/ NORMAL AORTIC VALVE MECHANICAL PROSTHESIS FUNCTION/ MILD LAE/ EF 55-60%    FAMILY HISTORY: Family History  Problem Relation Age of Onset  . Heart failure Mother   . Diabetes Mother   . Prostate cancer Father     SOCIAL HISTORY:  reports that he has never smoked. He has never used smokeless tobacco. He reports that he drinks about 1.8 oz of alcohol per week . He reports that he does not use drugs.  PERFORMANCE STATUS:  The patient's performance status is 1 - Symptomatic but completely ambulatory  PHYSICAL EXAM: Most Recent Vital Signs: Blood pressure 105/60, pulse 84, temperature 98.6 F (37 C), temperature source Oral, resp. rate 18, weight 184 lb 12.8 oz (83.8 kg), SpO2 99 %. BP 105/60 (BP Location: Right Arm, Patient Position: Sitting)   Pulse 84   Temp 98.6 F (37 C) (Oral)   Resp 18   Wt 184 lb 12.8 oz (83.8 kg)   SpO2 99%   BMI 26.52 kg/m   General Appearance:    Alert, cooperative, no distress, appears stated age  Head:    Normocephalic, without obvious abnormality, atraumatic  Eyes:    PERRL, conjunctiva/corneas clear, EOM's intact, fundi    benign, both eyes              Throat:   Lips, mucosa, and tongue normal; teeth and gums normal  Neck:   Supple, symmetrical, trachea midline, no adenopathy;       thyroid:  No enlargement/tenderness/nodules; no carotid   bruit or JVD  Back:     Symmetric, no curvature, ROM normal, no CVA tenderness  Lungs:     Clear to auscultation bilaterally, respirations unlabored  Chest wall:    No tenderness or deformity  Heart:    Regular rate and rhythm, S1 and S2 normal, no murmur, rub   or gallop  Abdomen:     Soft, non-tender, bowel sounds active all four quadrants,    no masses, no organomegaly        Extremities:   Extremities normal,  atraumatic, no cyanosis or edema  Pulses:   2+ and symmetric all extremities  Skin:   Skin color, texture, turgor normal, no rashes or lesions  Lymph nodes:   Cervical, supraclavicular, and axillary nodes normal  Neurologic:   CNII-XII intact. Normal strength, sensation and reflexes      throughout    LABORATORY DATA:  Results for orders placed or performed in visit on 02/08/17 (from the past 48 hour(s))  CBC w/Diff     Status: Abnormal   Collection Time: 02/08/17  2:01 PM  Result Value Ref Range   WBC 10.8 (H) 4.0 - 10.0 10e3/uL   RBC 5.81 (H) 4.20 - 5.70 10e6/uL   HGB 11.9 (L) 13.0 - 17.1 g/dL   HCT 36.0 (L) 38.7 - 49.9 %   MCV 62 (L) 82 - 98 fL   MCH 20.5 (L) 28.0 - 33.4 pg   MCHC 33.1 32.0 - 35.9 g/dL   RDW 17.1 (H) 11.1 - 15.7 %   Platelets 210 Few Large platelets present 145 - 400 10e3/uL   NEUT# 7.8 (H) 1.5 - 6.5 10e3/uL   LYMPH# 1.5 0.9 - 3.3 10e3/uL   MONO# 1.0 (H) 0.1 - 0.9 10e3/uL   Eosinophils Absolute 0.5 0.0 - 0.5 10e3/uL   BASO# 0.1 0.0 - 0.2 10e3/uL   NEUT% 71.9 40.0 - 80.0 %   LYMPH% 14.1 14.0 - 48.0 %   MONO% 9.0 0.0 - 13.0 %   EOS% 4.3 0.0 - 7.0 %   BASO% 0.7 0.0 - 2.0 %  Smear     Status: None   Collection Time: 02/08/17  2:01 PM  Result Value Ref Range   Smear Result Smear Available   CMP STAT     Status: Abnormal   Collection Time: 02/08/17  2:01 PM  Result Value Ref Range   Sodium 137 128 - 145 mEq/L   Potassium 3.8 3.3 - 4.7 mEq/L  Chloride 102 98 - 108 mEq/L   CO2 31 18 - 33 mEq/L   Glucose, Bld 126 (H) 73 - 118 mg/dL   BUN, Bld 18 7 - 22 mg/dL   Creat 0.9 0.6 - 1.2 mg/dl   Total Bilirubin 1.60 0.20 - 1.60 mg/dl   Alkaline Phosphatase 128 (H) 26 - 84 U/L   AST 44 (H) 11 - 38 U/L   ALT(SGPT) 31 10 - 47 U/L   Total Protein 6.5 6.4 - 8.1 g/dL   Albumin 3.6 3.3 - 5.5 g/dL   Calcium 9.2 8.0 - 10.3 mg/dL      RADIOGRAPHY: No results found.     PATHOLOGY: None   ASSESSMENT/PLAN: Mr. Dunaj is a very pleasant 76 yo caucasian gentleman  with recent diagnosis of anemia. Hgb is 11.9 with an MCV of 62. He is mildly symptomatic with fatigue and chewing ice.  He is on anticoagulation for his artificial aortic valve and recently had an ablation for atrial fib.  He denies any episodes of obvious bleeding.  His exam today was negative and [place] in May showed no cirrhosis or splenomegaly. Iron studies and epo level are pending. We will plan to bring him back in next week for infusion or injection if needed.   We will see him back in 6 weeks for repeat lab work and follow-up.   All questions were answered. He will contact our office with any questions or concerns. We can certainly see him much sooner if necessary.  He was discussed with and also seen by Dr. Marin Olp and he is in agreement with the aforementioned.   [facility]   ADDENDUM:  I saw and examined the patient with Rommel Hogston. I agree with the above assessment. He clearly has microcytic red blood cells. We will have to see what his iron studies show.  I would not think that he has a hemoglobinopathy.  I suppose myelodysplasia is a possibility. Sometimes this can present with a markedly decreased MCV.  I looked at his blood on the microscope. I really do not see anything that looked suspicious.  He is on blood thinner. This would certainly account for any type of bleeding.  He did have a ultrasound done earlier this year. This was of the abdomen. This was unremarkable. There was no splenomegaly.  We will have to see how things look. He really is not that symptomatic. He really is only mildly anemic. I don't think we have to do an aggressive workup. Again, he is asymptomatic.  We will plan to get him back in another 6 weeks. We will await the lab results.  He spent about 40 minutes with him. We reviewed the last. We answered his questions.   Lattie Haw, MD

## 2017-02-09 ENCOUNTER — Encounter (HOSPITAL_COMMUNITY)
Admission: RE | Admit: 2017-02-09 | Discharge: 2017-02-09 | Disposition: A | Discharge status: Home / Self Care | Payer: Medicare Other | Source: Ambulatory Visit | Attending: Cardiology | Admitting: Cardiology

## 2017-02-09 DIAGNOSIS — I5022 Chronic systolic (congestive) heart failure: Secondary | ICD-10-CM | POA: Diagnosis not present

## 2017-02-09 LAB — FERRITIN: Ferritin: 564 ng/ml — ABNORMAL HIGH (ref 22–316)

## 2017-02-09 LAB — IRON AND TIBC
%SAT: 40 % (ref 20–55)
Iron: 105 ug/dL (ref 42–163)
TIBC: 264 ug/dL (ref 202–409)
UIBC: 158 ug/dL (ref 117–376)

## 2017-02-09 LAB — LACTATE DEHYDROGENASE: LDH: 335 U/L — ABNORMAL HIGH (ref 125–245)

## 2017-02-09 LAB — RETICULOCYTES: Reticulocyte Count: 1.5 % (ref 0.6–2.6)

## 2017-02-09 LAB — ERYTHROPOIETIN: Erythropoietin: 21 m[IU]/mL — ABNORMAL HIGH (ref 2.6–18.5)

## 2017-02-12 ENCOUNTER — Encounter (HOSPITAL_COMMUNITY)
Admission: RE | Admit: 2017-02-12 | Discharge: 2017-02-12 | Disposition: A | Discharge status: Home / Self Care | Payer: Medicare Other | Source: Ambulatory Visit | Attending: Cardiology | Admitting: Cardiology

## 2017-02-12 DIAGNOSIS — I5022 Chronic systolic (congestive) heart failure: Secondary | ICD-10-CM | POA: Diagnosis not present

## 2017-02-14 ENCOUNTER — Encounter (HOSPITAL_COMMUNITY)
Admission: RE | Admit: 2017-02-14 | Discharge: 2017-02-14 | Disposition: A | Discharge status: Home / Self Care | Payer: Medicare Other | Source: Ambulatory Visit | Attending: Cardiology | Admitting: Cardiology

## 2017-02-14 DIAGNOSIS — I5022 Chronic systolic (congestive) heart failure: Secondary | ICD-10-CM | POA: Diagnosis not present

## 2017-02-15 ENCOUNTER — Ambulatory Visit: Payer: Medicare Other | Admitting: Cardiology

## 2017-02-16 ENCOUNTER — Encounter (HOSPITAL_COMMUNITY): Payer: Medicare Other

## 2017-02-19 ENCOUNTER — Encounter (HOSPITAL_COMMUNITY)
Admission: RE | Admit: 2017-02-19 | Discharge: 2017-02-19 | Disposition: A | Discharge status: Home / Self Care | Payer: Medicare Other | Source: Ambulatory Visit | Attending: Cardiology | Admitting: Cardiology

## 2017-02-19 DIAGNOSIS — I5022 Chronic systolic (congestive) heart failure: Secondary | ICD-10-CM

## 2017-02-20 ENCOUNTER — Other Ambulatory Visit (HOSPITAL_COMMUNITY): Payer: Self-pay | Admitting: Adult Health

## 2017-02-20 DIAGNOSIS — I5043 Acute on chronic combined systolic (congestive) and diastolic (congestive) heart failure: Secondary | ICD-10-CM

## 2017-02-21 ENCOUNTER — Encounter (HOSPITAL_COMMUNITY)
Admission: RE | Admit: 2017-02-21 | Discharge: 2017-02-21 | Disposition: A | Discharge status: Home / Self Care | Payer: Medicare Other | Source: Ambulatory Visit | Attending: Cardiology | Admitting: Cardiology

## 2017-02-21 DIAGNOSIS — I5022 Chronic systolic (congestive) heart failure: Secondary | ICD-10-CM | POA: Diagnosis present

## 2017-02-23 ENCOUNTER — Encounter (HOSPITAL_COMMUNITY)
Admission: RE | Admit: 2017-02-23 | Discharge: 2017-02-23 | Disposition: A | Discharge status: Home / Self Care | Payer: Medicare Other | Source: Ambulatory Visit | Attending: Cardiology | Admitting: Cardiology

## 2017-02-23 VITALS — Ht 68.0 in | Wt 183.4 lb

## 2017-02-23 DIAGNOSIS — I5022 Chronic systolic (congestive) heart failure: Secondary | ICD-10-CM

## 2017-02-26 ENCOUNTER — Encounter (HOSPITAL_COMMUNITY)
Admission: RE | Admit: 2017-02-26 | Discharge: 2017-02-26 | Disposition: A | Discharge status: Home / Self Care | Payer: Medicare Other | Source: Ambulatory Visit | Attending: Cardiology | Admitting: Cardiology

## 2017-02-26 DIAGNOSIS — I5022 Chronic systolic (congestive) heart failure: Secondary | ICD-10-CM | POA: Diagnosis not present

## 2017-02-28 ENCOUNTER — Encounter: Payer: Self-pay | Admitting: Cardiology

## 2017-02-28 ENCOUNTER — Ambulatory Visit (INDEPENDENT_AMBULATORY_CARE_PROVIDER_SITE_OTHER): Payer: Medicare Other | Admitting: Cardiology

## 2017-02-28 ENCOUNTER — Encounter (HOSPITAL_COMMUNITY): Payer: Medicare Other

## 2017-02-28 VITALS — BP 106/74 | HR 78 | Ht 69.5 in | Wt 182.8 lb

## 2017-02-28 DIAGNOSIS — B079 Viral wart, unspecified: Secondary | ICD-10-CM | POA: Diagnosis not present

## 2017-02-28 DIAGNOSIS — I428 Other cardiomyopathies: Secondary | ICD-10-CM

## 2017-02-28 DIAGNOSIS — D485 Neoplasm of uncertain behavior of skin: Secondary | ICD-10-CM | POA: Diagnosis not present

## 2017-02-28 DIAGNOSIS — I481 Persistent atrial fibrillation: Secondary | ICD-10-CM | POA: Diagnosis not present

## 2017-02-28 DIAGNOSIS — L814 Other melanin hyperpigmentation: Secondary | ICD-10-CM | POA: Diagnosis not present

## 2017-02-28 DIAGNOSIS — Z85828 Personal history of other malignant neoplasm of skin: Secondary | ICD-10-CM | POA: Diagnosis not present

## 2017-02-28 DIAGNOSIS — D225 Melanocytic nevi of trunk: Secondary | ICD-10-CM | POA: Diagnosis not present

## 2017-02-28 DIAGNOSIS — D1801 Hemangioma of skin and subcutaneous tissue: Secondary | ICD-10-CM | POA: Diagnosis not present

## 2017-02-28 DIAGNOSIS — L309 Dermatitis, unspecified: Secondary | ICD-10-CM | POA: Diagnosis not present

## 2017-02-28 DIAGNOSIS — Z85820 Personal history of malignant melanoma of skin: Secondary | ICD-10-CM | POA: Diagnosis not present

## 2017-02-28 DIAGNOSIS — I4819 Other persistent atrial fibrillation: Secondary | ICD-10-CM

## 2017-02-28 DIAGNOSIS — Z952 Presence of prosthetic heart valve: Secondary | ICD-10-CM

## 2017-02-28 DIAGNOSIS — L570 Actinic keratosis: Secondary | ICD-10-CM | POA: Diagnosis not present

## 2017-02-28 DIAGNOSIS — L821 Other seborrheic keratosis: Secondary | ICD-10-CM | POA: Diagnosis not present

## 2017-02-28 DIAGNOSIS — Z86018 Personal history of other benign neoplasm: Secondary | ICD-10-CM | POA: Diagnosis not present

## 2017-02-28 MED ORDER — TORSEMIDE 20 MG PO TABS: 20.0000 mg | ORAL_TABLET | ORAL | Status: DC

## 2017-02-28 NOTE — Addendum Note (Signed)
Addended by: Cristopher Estimable on: 02/28/2017 02:11 PM   Modules accepted: Orders

## 2017-02-28 NOTE — Patient Instructions (Addendum)
Your physician recommends that you schedule a follow-up appointment in: 3 MONTHS WITH DR CAMNITZ   STOP DIGOXIN  TAKE TORSEMIDE AS NEEDED

## 2017-02-28 NOTE — Progress Notes (Signed)
Electrophysiology Office Note   Date:  02/28/2017   ID:  Adrian Lambert, DOB February 14, 1941, MRN [id number]  PCP:  Hulan Fess, MD  Cardiologist:  [place], [place] Primary Electrophysiologist:  Constance Haw, MD    Chief Complaint  Patient presents with  . Follow-up    Persistent Afib/3 months post ablation     History of Present Illness: Adrian Lambert is a 76 y.o. male who is being seen today for the evaluation of atrial fibrillation at the request of Hulan Fess, MD. Presenting today for electrophysiology evaluation. 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 He was admitted on 123 with volume overload, lower extremity edema and atrial fibrillation with rapid rates. His initial cardioversion attempt failed. He was diuresed with IV Lasix and was started on amiodarone and had successful cardioversion on 08/28/16. Had AF ablation 11/28/16 and improvement in his LVEF on his last echo to 45%.  Today, denies symptoms of palpitations, chest pain, shortness of breath, orthopnea, PND, lower extremity edema, claudication, presyncope, syncope, bleeding, or neurologic sequela. The patient is tolerating medications without difficulties and is otherwise without complaint today. He continues to feel mildly fatigued, but significantly better than he did prior to his ablation. He does have some dizziness mainly when he stands up. This is after working in the yard for quite some time.    Past Medical History:  Diagnosis Date  . Anticoagulant long-term use   . Arthritis    "probably in my thumbs" (06/26/2012)  . Bifascicular block   . Chronic combined systolic and diastolic CHF (congestive heart failure) ([facility])    a. 01/2016 Echo: EF 45-50%, gr2 DD, inflat AK (poor acoustic  windows);  b. 07/2016 Echo: EF 25-30%, mild LVH, PASP 73mmHg (pt in Afib).  . Complication of anesthesia    "wake up w/a start; hallucinations" (06/26/2012)  . Critical Aortic Stenosis    a. 03/2003 s/p SJM #25 mech prosthetic AoV;  b. 07/2016 Echo: EF 25-30% (in setting of AF), AoV area 1.72 cm^2 (VTI), 1.75 cm^2 (Vmean).  . Diverticulosis   . Gouty arthritis    "have had it in both feet, ankles, right knee" (06/26/2012)  . History of diverticulitis of colon   . History of kidney stones   . History of pancreatitis   . Hypertension   . Incomplete RBBB   . PAF (paroxysmal atrial fibrillation) (HCC)    a. CHA2DS2VASc = 4-->chronic coumadin in setting of mech AoV;  b. 07/2016 Recurrent AF RVR.   Past Surgical History:  Procedure Laterality Date  . AORTIC VALVE REPLACEMENT  04-14-2003  DR Servando Snare   #22mm [facility] MECHNICAL PROSTHESIS  . ATRIAL FIBRILLATION ABLATION N/A 11/28/2016   Procedure: Atrial Fibrillation Ablation;  Surgeon: Constance Haw, MD;  Location: [facility];  Service: Cardiovascular;  Laterality: N/A;  . CARDIOVERSION N/A 08/18/2016   Procedure: CARDIOVERSION;  Surgeon: Thayer Headings, MD;  Location: [facility]OS[facility];  Service: Cardiovascular;  Laterality: N/A;  . CARDIOVERSION N/A 08/28/2016   Procedure: CARDIOVERSION;  Surgeon: Larey Dresser, MD;  Location: [facility];  Service: Cardiovascular;  Laterality: N/A;  . CATARACT EXTRACTION W/ INTRAOCULAR LENS  IMPLANT, BILATERAL  RIGHT 2010/  LEFT DEC 2013  . HEMICOLECTOMY  10/06/2003   Laparoscopic-assisted left hemicolectomy for diverticulitis  .  LAPAROSCOPIC CHOLECYSTECTOMY  07-23-2006  . PROSTATE BIOPSY  10/09/2012   gleason 3+4=7  . RADIOACTIVE SEED IMPLANT N/A 01/09/2013   Procedure: RADIOACTIVE SEED IMPLANT;  Surgeon: Franchot Gallo, MD;  Location: [facility];  Service: Urology;  Laterality: N/A;  . TEE WITHOUT CARDIOVERSION N/A 08/18/2016   Procedure: TRANSESOPHAGEAL ECHOCARDIOGRAM (TEE);   Surgeon: Thayer Headings, MD;  Location: [facility];  Service: Cardiovascular;  Laterality: N/A;  . TRANSTHORACIC ECHOCARDIOGRAM  04-28-2008   MILD LVH / NORMAL LSF/ NORMAL AORTIC VALVE MECHANICAL PROSTHESIS FUNCTION/ MILD LAE/ EF 55-60%     Current Outpatient Prescriptions  Medication Sig Dispense Refill  . allopurinol (ZYLOPRIM) 300 MG tablet Take 300 mg by mouth daily.     Marland Kitchen aspirin 81 MG chewable tablet Chew 1 tablet (81 mg total) by mouth daily. 30 tablet 6  . colchicine 0.6 MG tablet Take 0.6 mg by mouth daily as needed (for gout).     . DIGOX 125 MCG tablet TAKE 1 TABLET(0.125 MG) BY MOUTH DAILY 90 tablet 6  . docusate sodium (COLACE) 100 MG capsule Take 100 mg by mouth daily.    Marland Kitchen losartan (COZAAR) 25 MG tablet Take 0.5 tablets (12.5 mg total) by mouth at bedtime. 30 tablet 11  . metoprolol succinate (TOPROL-XL) 25 MG 24 hr tablet TAKE 1 TABLET(25 MG) BY MOUTH DAILY 90 tablet 6  . Psyllium (METAMUCIL PO) Take 2 capsules by mouth 2 (two) times daily.     Marland Kitchen spironolactone (ALDACTONE) 25 MG tablet Take 0.5 tablets (12.5 mg total) by mouth daily after supper. Take 1/2 tab once daily    . tamsulosin (FLOMAX) 0.4 MG CAPS capsule Take 0.4 mg by mouth every other day.     . tolterodine (DETROL LA) 4 MG 24 hr capsule Take 4 mg by mouth daily.   0  . torsemide (DEMADEX) 20 MG tablet Take 1 tablet (20 mg total) by mouth PC lunch.    . warfarin (COUMADIN) 5 MG tablet Take 0.5-1 tablets (2.5-5 mg total) by mouth See admin instructions. Take 5 mg by mouth Monday, Wednesday, Friday and Sunday. Take 2.5 mg by mouth on all other days. (Patient taking differently: Take 2.5-5 mg by mouth See admin instructions. Takes 5 mg on Tuesday, Thursday, and Saturday and takes 2.5 mg (1/2) tab on Sun, Mon, Wed, and Fri) 45 tablet 6   No current facility-administered medications for this visit.     Allergies:   Celecoxib   Social History:  The patient  reports that he has never smoked. He has never used  smokeless tobacco. He reports that he drinks about 1.8 oz of alcohol per week . He reports that he does not use drugs.   Family History:  The patient's family history includes Diabetes in his mother; Heart failure in his mother; Prostate cancer in his father.    ROS:  Please see the history of present illness.   Otherwise, review of systems is positive for dizziness.   All other systems are reviewed and negative.   PHYSICAL EXAM: VS:  BP 106/74   Pulse 78   Ht 5' 9.5" (1.765 m)   Wt 182 lb 12.8 oz (82.9 kg)   SpO2 98%   BMI 26.61 kg/m  , BMI Body mass index is 26.61 kg/m. GEN: Well nourished, well developed, in no acute distress  HEENT: normal  Neck: no JVD, carotid bruits, or masses Cardiac: RRR; no murmurs, rubs, or gallops,no edema  Respiratory:  clear to auscultation bilaterally,  normal work of breathing GI: soft, nontender, nondistended, + BS MS: no deformity or atrophy  Skin: warm and dry Neuro:  Strength and sensation are intact Psych: euthymic mood, full affect  EKG:  EKG is ordered today. Personal review of the ekg ordered shows sinus rhythm, left anterior fascicular block, right bundle branch block   Recent Labs: 08/18/2016: Magnesium 2.2 09/04/2016: B Natriuretic Peptide 325.9 12/05/2016: TSH 3.210 02/08/2017: ALT(SGPT) 31; BUN, Bld 18; Creat 0.9; HGB 11.9; Platelets 210 Few Large platelets present; Potassium 3.8; Sodium 137    Lipid Panel  No results found for: CHOL, TRIG, HDL, CHOLHDL, VLDL, LDLCALC, LDLDIRECT   Wt Readings from Last 3 Encounters:  02/28/17 182 lb 12.8 oz (82.9 kg)  02/23/17 183 lb 6.8 oz (83.2 kg)  02/08/17 184 lb 12.8 oz (83.8 kg)      Other studies Reviewed: Additional studies/ records that were reviewed today include: TTE 12/05/16 Review of the above records today demonstrates:  - Left ventricle: The cavity size was normal. Wall thickness was   increased in a pattern of mild LVH. The estimated ejection   fraction was 45%. Diffuse  hypokinesis. Doppler parameters are   consistent with abnormal left ventricular relaxation (grade 1   diastolic dysfunction). - Aortic valve: There is a Probation officer aortic valve. No   significant stenosis. There was trivial regurgitation. Mean   gradient (S): 12 mm Hg. - Aorta: Dilated ascending aorta. Ascending aortic diameter: 44 mm   (S). - Mitral valve: Mildly calcified annulus. There was trivial   regurgitation. - Left atrium: The atrium was mildly dilated. - Right ventricle: The cavity size was normal. Systolic function   was mildly reduced. - Tricuspid valve: Peak RV-RA gradient (S): 21 mm Hg. - Pulmonary arteries: PA peak pressure: 24 mm Hg (S). - Inferior vena cava: The vessel was normal in size. The   respirophasic diameter changes were in the normal range (>= 50%),   consistent with normal central venous pressure.  ASSESSMENT AND PLAN:  1.  Atrial fibrillation: On amiodarone and warfarin. AF ablation 11/28/16. Remained in sinus rhythm today. Has previously been taken off of his amiodarone. We'll plan to stop his digoxin today. No further changes.  This patients CHA2DS2-VASc Score and unadjusted Ischemic Stroke Rate (% per year) is equal to 4.8 % stroke rate/year from a score of 4  Above score calculated as 1 point each if present [CHF, HTN, DM, Vascular=MI/PAD/Aortic Plaque, Age if 65-74, or Male] Above score calculated as 2 points each if present [Age > 75, or Stroke/TIA/TE]  2. Chronic systolic heart failure: Ejection fraction has improved to 45%. No indication for defibrillator at this time. Currently on optimal medical therapy.  3. Aortic stenosis status post [employer] Jude mechanical valve: Valve functioning appropriately on most recent echo. No changes at this time.    Current medicines are reviewed at length with the patient today.   The patient does not have concerns regarding his medicines.  The following changes were made today:  none  Labs/ tests ordered  today include:  Orders Placed This Encounter  Procedures  . EKG 12-Lead     Disposition:   FU with Kelwin Gibler 3 months  Signed, Jerid Catherman Meredith Leeds, MD  02/28/2017 2:07 PM     [address] [place] [address] [phone] (office) [phone] (fax)

## 2017-03-02 ENCOUNTER — Encounter (HOSPITAL_COMMUNITY)
Admission: RE | Admit: 2017-03-02 | Discharge: 2017-03-02 | Disposition: A | Discharge status: Home / Self Care | Payer: Medicare Other | Source: Ambulatory Visit | Attending: Cardiology | Admitting: Cardiology

## 2017-03-02 VITALS — BP 114/70 | HR 70 | Ht 68.0 in | Wt 192.5 lb

## 2017-03-02 DIAGNOSIS — I5022 Chronic systolic (congestive) heart failure: Secondary | ICD-10-CM

## 2017-03-05 ENCOUNTER — Encounter (HOSPITAL_COMMUNITY): Payer: Medicare Other

## 2017-03-05 ENCOUNTER — Encounter (HOSPITAL_COMMUNITY)
Admission: RE | Admit: 2017-03-05 | Discharge: 2017-03-05 | Disposition: A | Discharge status: Home / Self Care | Payer: Medicare Other | Source: Ambulatory Visit | Attending: Cardiology | Admitting: Cardiology

## 2017-03-05 DIAGNOSIS — I5022 Chronic systolic (congestive) heart failure: Secondary | ICD-10-CM

## 2017-03-05 NOTE — Progress Notes (Signed)
Discharge Summary  Patient Details  Name: Adrian Lambert MRN: [id number] Date of Birth: [birth year] Referring Provider:     CARDIAC REHAB PHASE II ORIENTATION from 11/16/2016 in [facility]  Referring Provider  [name] MD       Number of Visits: 36  Reason for Discharge:  Patient independent in their exercise.  Smoking History:  History  Smoking Status  . Never Smoker  Smokeless Tobacco  . Never Used    Diagnosis:  Heart failure, chronic systolic (HCC)  ADL UCSD:   Initial Exercise Prescription:     Initial Exercise Prescription - 11/16/16 1300      Date of Initial Exercise RX and Referring Provider   Date 11/16/16   Referring Provider [name] MD     Treadmill   MPH 1.7   Grade 0   Minutes 10   METs 2.3     Recumbant Bike   Level 1.5   Minutes 10   METs 1     NuStep   Level 2   SPM 70   Minutes 10   METs 2     Prescription Details   Frequency (times per week) 3   Duration Progress to 30 minutes of continuous aerobic without signs/symptoms of physical distress     Intensity   THRR 40-80% of Max Heartrate 58-116   Ratings of Perceived Exertion 11-13   Perceived Dyspnea 0-4     Progression   Progression Continue to progress workloads to maintain intensity without signs/symptoms of physical distress.     Resistance Training   Training Prescription Yes   Weight 2lbs   Reps 10-15      Discharge Exercise Prescription (Final Exercise Prescription Changes):     Exercise Prescription Changes - 03/06/17 1100      Response to Exercise   Blood Pressure (Admit) 98/64   Blood Pressure (Exercise) 112/60   Blood Pressure (Exit) 107/68  recheck after water 99/61   Heart Rate (Admit) 75 bpm   Heart Rate (Exercise) 110 bpm   Heart Rate (Exit) 86 bpm   Rating of Perceived Exertion (Exercise) 12   Symptoms none   Duration Continue with 30 min of aerobic exercise without signs/symptoms of physical  distress.   Intensity THRR unchanged     Progression   Progression Continue to progress workloads to maintain intensity without signs/symptoms of physical distress.   Average METs 3.5     Resistance Training   Training Prescription Yes   Weight 5lbs   Reps 10-15   Time 10 Minutes     Treadmill   MPH 2.5   Grade 1   Minutes 10   METs 3.26     Recumbant Bike   Level 3   Minutes 10   METs 3.2     NuStep   Level 4   SPM 100   Minutes 10   METs 3.9     Home Exercise Plan   Plans to continue exercise at Home (comment)  on treadmill 2(15' bouts)   Frequency Add 2 additional days to program exercise sessions.   Initial Home Exercises Provided 12/11/16      Functional Capacity:     6 Minute Walk    Row Name 11/16/16 1327 02/23/17 1006 03/06/17 1158     6 Minute Walk   Phase Initial Discharge  -   Distance 1046 feet 1498 feet  -   Distance % Change  -  - 43.21 %  Walk Time 6 minutes 6 minutes  -   # of Rest Breaks 0 0  -   MPH 1.98 2.83  -   METS 1.75 2.76  -   RPE 12 12  -   VO2 Peak 6.13 9.65  -   Symptoms No No  -   Resting HR 70 bpm 88 bpm  -   Resting BP 114/70 100/60  -   Max Ex. HR 77 bpm 95 bpm  -   Max Ex. BP 110/62 108/60  -   2 Minute Post BP 108/64 104/64  -      Psychological, QOL, Others - Outcomes: PHQ 2/9: Depression screen [facility] 2/9 03/06/2017 11/20/2016  Decreased Interest 0 0  Down, Depressed, Hopeless 0 0  PHQ - 2 Score 0 0    Quality of Life:     Quality of Life - 03/05/17 1114      Quality of Life Scores   Health/Function Pre 24.47 %   Health/Function Post 24.21 %   Health/Function % Change -1.06 %   Socioeconomic Pre 24.64 %   Socioeconomic Post 24.21 %   Socioeconomic % Change  -1.75 %   Psych/Spiritual Pre 30 %   Psych/Spiritual Post 26.57 %   Psych/Spiritual % Change -11.43 %   Family Pre 26.4 %   Family Post 27.6 %   Family % Change 4.55 %   GLOBAL Pre 25.93 %   GLOBAL Post 25.23 %   GLOBAL % Change -2.7 %       Personal Goals: Goals established at orientation with interventions provided to work toward goal.     Personal Goals and Risk Factors at Admission - 11/16/16 1337      Core Components/Risk Factors/Patient Goals on Admission    Weight Management Yes;Weight Loss;Weight Maintenance   Intervention Weight Management: Develop a combined nutrition and exercise program designed to reach desired caloric intake, while maintaining appropriate intake of nutrient and fiber, sodium and fats, and appropriate energy expenditure required for the weight goal.;Weight Management: Provide education and appropriate resources to help participant work on and attain dietary goals.;Weight Management/Obesity: Establish reasonable short term and long term weight goals.   Expected Outcomes Short Term: Continue to assess and modify interventions until short term weight is achieved;Long Term: Adherence to nutrition and physical activity/exercise program aimed toward attainment of established weight goal;Weight Maintenance: Understanding of the daily nutrition guidelines, which includes 25-35% calories from fat, 7% or less cal from saturated fats, less than 267m cholesterol, less than 1.5gm of sodium, & 5 or more servings of fruits and vegetables daily;Weight Loss: Understanding of general recommendations for a balanced deficit meal plan, which promotes 1-2 lb weight loss per week and includes a negative energy balance of [phone] kcal/d;Understanding recommendations for meals to include 15-35% energy as protein, 25-35% energy from fat, 35-60% energy from carbohydrates, less than 2081mof dietary cholesterol, 20-35 gm of total fiber daily;Understanding of distribution of calorie intake throughout the day with the consumption of 4-5 meals/snacks   Hypertension Yes   Intervention Provide education on lifestyle modifcations including regular physical activity/exercise, weight management, moderate sodium restriction and increased  consumption of fresh fruit, vegetables, and low fat dairy, alcohol moderation, and smoking cessation.;Monitor prescription use compliance.   Expected Outcomes Short Term: Continued assessment and intervention until BP is < 140/9056mG in hypertensive participants. < 130/16m29m in hypertensive participants with diabetes, heart failure or chronic kidney disease.;Long Term: Maintenance of blood pressure at goal levels.  Personal Goal Other Yes   Personal Goal Learn better medication management   Intervention Provide cardiac education classes geared toward medication management and risk factor modification to assist with improving pt's overall quality of life   Expected Outcomes Pt have increased knowledge and understanding of medication and modifiable risk factors       Personal Goals Discharge:     Goals and Risk Factor Review    Row Name 12/13/16 1641 01/01/17 1731 02/05/17 1215 03/06/17 0900 03/20/17 0714     Core Components/Risk Factors/Patient Goals Review   Personal Goals Review Weight Management/Obesity;Hypertension;Other Weight Management/Obesity;Hypertension;Other Weight Management/Obesity;Hypertension;Other Weight Management/Obesity;Hypertension;Other Weight Management/Obesity;Hypertension;Other   Review pt demonstrates eagerness to participate in CR activities.  pt demonstrates eagerness to participate in CR activities.  pt demonstrates eagerness to participate in CR activities.  pt demonstrates eagerness to participate in CR activities.  pt with excellent participation in CR activities. pt plans to continue exercising on his own.    Expected Outcomes pt will participate in CR exercise, nutrition and lifestyle education opportunities to decrease overall CAD risk factors.  pt will participate in CR exercise, nutrition and lifestyle education opportunities to decrease overall CAD risk factors.  pt will participate in CR exercise, nutrition and lifestyle education opportunities to decrease  overall CAD risk factors.  pt will participate in CR exercise, nutrition and lifestyle education opportunities to decrease overall CAD risk factors.  pt will continue exercise, nutrition and lifestyle modifications to decrease overall CAD risk factors.       Nutrition & Weight - Outcomes:     Pre Biometrics - 11/16/16 1328      Pre Biometrics   Waist Circumference 42 inches   Hip Circumference 40 inches   Waist to Hip Ratio 1.05 %   Triceps Skinfold 14 mm   % Body Fat 27.7 %   Grip Strength 35 kg   Flexibility 9.25 in   Single Leg Stand 1.65 seconds         Post Biometrics - 02/23/17 1006       Post  Biometrics   Height 5' 8"  (1.727 m)   Weight 183 lb 6.8 oz (83.2 kg)   Waist Circumference 38 inches   Hip Circumference 39.75 inches   Waist to Hip Ratio 0.96 %   BMI (Calculated) 27.9   Triceps Skinfold 14 mm   % Body Fat 26.6 %   Grip Strength 38 kg   Flexibility 10 in   Single Leg Stand 1.62 seconds      Nutrition:     Nutrition Therapy & Goals - 03/16/17 1131      Nutrition Therapy   Diet Therapeutic Lifestyle Changes     Personal Nutrition Goals   Nutrition Goal Pt to describe the benefit of including fruits, vegetables, whole grains, and low-fat dairy products in a heart healthy meal plan.     Intervention Plan   Intervention Prescribe, educate and counsel regarding individualized specific dietary modifications aiming towards targeted core components such as weight, hypertension, lipid management, diabetes, heart failure and other comorbidities.   Expected Outcomes Short Term Goal: Understand basic principles of dietary content, such as calories, fat, sodium, cholesterol and nutrients.;Long Term Goal: Adherence to prescribed nutrition plan.      Nutrition Discharge:     Nutrition Assessments - 03/16/17 1130      MEDFICTS Scores   Pre Score 27   Post Score 22   Score Difference -5      Education Questionnaire Score:  Knowledge Questionnaire  Score - 03/05/17 1114      Knowledge Questionnaire Score   Post Score 22/24      Goals reviewed with patient; copy given to patient. Adrian Lambert graduated from cardiac rehab program today with completion of 36 exercise sessions in Phase II. Pt maintained good attendance and progressed nicely during his participation in rehab as evidenced by increased MET level.   Medication list reconciled. Repeat  PHQ score-0  .  Pt has made significant lifestyle changes and should be commended for his success. Pt feels he has achieved his goals during cardiac rehab.   Pt plans to continue exercise by walking on the treadmill and riding his bike at home.Barnet Pall, RN,BSN 03/27/2017 11:46 AM

## 2017-03-07 ENCOUNTER — Encounter: Payer: Self-pay | Admitting: Cardiology

## 2017-03-07 ENCOUNTER — Encounter (HOSPITAL_COMMUNITY): Payer: Medicare Other

## 2017-03-09 ENCOUNTER — Encounter (HOSPITAL_COMMUNITY): Payer: Medicare Other

## 2017-03-12 ENCOUNTER — Encounter (HOSPITAL_COMMUNITY): Payer: Medicare Other

## 2017-03-13 ENCOUNTER — Encounter (HOSPITAL_COMMUNITY): Payer: Self-pay | Admitting: Cardiology

## 2017-03-13 ENCOUNTER — Ambulatory Visit (HOSPITAL_COMMUNITY)
Admission: RE | Admit: 2017-03-13 | Discharge: 2017-03-13 | Disposition: A | Discharge status: Home / Self Care | Payer: Medicare Other | Source: Ambulatory Visit | Attending: Cardiology | Admitting: Cardiology

## 2017-03-13 VITALS — BP 110/65 | HR 79 | Wt 184.2 lb

## 2017-03-13 DIAGNOSIS — I5042 Chronic combined systolic (congestive) and diastolic (congestive) heart failure: Secondary | ICD-10-CM | POA: Diagnosis not present

## 2017-03-13 DIAGNOSIS — N183 Chronic kidney disease, stage 3 (moderate): Secondary | ICD-10-CM | POA: Insufficient documentation

## 2017-03-13 DIAGNOSIS — E669 Obesity, unspecified: Secondary | ICD-10-CM | POA: Diagnosis not present

## 2017-03-13 DIAGNOSIS — I712 Thoracic aortic aneurysm, without rupture: Secondary | ICD-10-CM | POA: Insufficient documentation

## 2017-03-13 DIAGNOSIS — Z7982 Long term (current) use of aspirin: Secondary | ICD-10-CM | POA: Diagnosis not present

## 2017-03-13 DIAGNOSIS — Z952 Presence of prosthetic heart valve: Secondary | ICD-10-CM | POA: Insufficient documentation

## 2017-03-13 DIAGNOSIS — I480 Paroxysmal atrial fibrillation: Secondary | ICD-10-CM

## 2017-03-13 DIAGNOSIS — Z79899 Other long term (current) drug therapy: Secondary | ICD-10-CM | POA: Diagnosis not present

## 2017-03-13 DIAGNOSIS — I130 Hypertensive heart and chronic kidney disease with heart failure and stage 1 through stage 4 chronic kidney disease, or unspecified chronic kidney disease: Secondary | ICD-10-CM | POA: Insufficient documentation

## 2017-03-13 DIAGNOSIS — I5022 Chronic systolic (congestive) heart failure: Secondary | ICD-10-CM | POA: Diagnosis not present

## 2017-03-13 DIAGNOSIS — Z9889 Other specified postprocedural states: Secondary | ICD-10-CM | POA: Diagnosis not present

## 2017-03-13 DIAGNOSIS — Z7901 Long term (current) use of anticoagulants: Secondary | ICD-10-CM | POA: Diagnosis not present

## 2017-03-13 DIAGNOSIS — Z8546 Personal history of malignant neoplasm of prostate: Secondary | ICD-10-CM | POA: Insufficient documentation

## 2017-03-13 DIAGNOSIS — I4891 Unspecified atrial fibrillation: Secondary | ICD-10-CM | POA: Insufficient documentation

## 2017-03-13 DIAGNOSIS — R55 Syncope and collapse: Secondary | ICD-10-CM | POA: Diagnosis not present

## 2017-03-13 MED ORDER — TORSEMIDE 20 MG PO TABS: 20.0000 mg | ORAL_TABLET | ORAL | Status: DC

## 2017-03-13 NOTE — Patient Instructions (Signed)
Your physician recommends that you schedule a follow-up appointment in: 4 months  

## 2017-03-14 ENCOUNTER — Encounter (HOSPITAL_COMMUNITY): Payer: Medicare Other

## 2017-03-14 NOTE — Progress Notes (Signed)
PCP: Dr Rex Kras - Also manages [facility] Cardiology: Dr. Martinique  HF Cardiology: Dr Aundra Dubin   HPI: [name] is a 76 year old male with a history of severe AS s/p St Jude mechanical AVR in 03/2003 on chronic coumadin, HTN, obesity, and mild LV dysfxn EF 40-45% on 01/2016 echo. Prior to admit in 1/18, he had a cough and dyspnea that was thought to be bronchitis so he was treated with abx and oral steroids. He was seen in cardiology office in 1/18 and was noted to be in rapid atrial fibrillation with significant volume overload.   Admitted 1/23 through 08/29/16 with marked volume overload, lower extremity edema, and atrial fibrillation with RVR.  Initial cardioversion failed and he was difficult to diurese. He was placed on milrinone for low output and diuresed with IV lasix with good response.  Weight came down considerably. Amiodarone was begun. Once fully diuresed, he had successful DC-CV on 08/28/16. Discharged on torsemide 60 mg daily. Discharge weight 212 pounds. Echo and TEE in 1/18 had shown fall in EF to around 25%.   He continued to lose weight as an outpatient.  He had a syncopal episode where he stood up from sitting and got lightheaded with transient loss of consciousness.  He had been having orthostatic symptoms.  We saw him in the office and cut back on torsemide from 60 mg daily down eventually to 20 mg daily.  Creatinine up some to 1.7 at that time.    At a prior appt, he was back in atrial fibrillation.  He was set up for DCCV in 3/18, but was in NSR when he arrived to short stay. He had atrial fibrillation ablation with Dr. Curt Bears in 5/18.   Echo was done in 5/18, EF up to 45% with mild diffuse hypokinesis, normal mechanical aortic valve, mildly decreased RV systolic function.    He is doing well currently.  He is now off digoxin and amiodarone.  BP is stable, no lightheaded spells.  He has finished cardiac rehab.  He has been taking torsemide prn since earlier this month and has not gained  weight of developed dyspnea.  He walks on a treadmill daily and can climb a flight of steps without problems.  No chest pain.  No orthopnea/PND.    ECG (personally reviewed): NSR, LAFB, inferior Qs, iRBBB  Labs (09/04/2016):  Hgb 15.9, K 5.0, Creatinine 1.77, digoxin 0.7, BNP 326  Labs (3/18): digoxin 0.8, K 4.8, creatinine 1.44, TSH normal, AST 54, ALT 49 Labs (4/18): K 4.3, creatinine 1.3 Labs (7/18): K 3.8, creatinine 0.9, hgb 11.9  PMH: 1. Aortic stenosis: # 25 St Jude mechanical aortic valve in 9/14.   2. Chronic systolic CHF: Echo in 2/35 with EF 45-50%.  Admitted in 1/18 with afib with RVR, ?tachycardia-mediated cardiomyopathy.  - Echo (1/18) with EF 25-30%, stable mechanical aortic valve.  - TEE (1/18) with EF 20-25%, stable mechanical aortic valve, moderate MR, small PFO.  - Admission 1/18 with acute on chronic systolic CHF, required milrinone, extensively diuresed.  - Echo (5/18) with EF 45%, diffuse hypokinesis, normal RV size with mildly decreased systolic function, mechanical aortic valve with mean gradient 14 mmHg, ascending aorta 4.4 cm.  3. Atrial fibrillation: First noted in 3/61, uncertain how long it had been present (with RVR).  08/28/2016 Successful DC-CV --> NSR.  Back in atrial fibrillation at 09/18/16 office visit.  Set up for DCCV in 3/18 but back in NSR.   - Atrial fibrillation ablation in 5/18.  Now off amiodarone.  4. 09/04/2016 Syncope--> over-diuresis most likely.  5. HTN 6. Ascending aortic aneurysm: 4.4 cm on 7/17 echo, 4.4 cm on 5/18 echo.  7. Prostate cancer: Treated with radioactive seed implantation.  8. Nephrolithiasis.  9. PFTs (4/18): Mild restriction.   ROS: All systems negative except as listed in HPI, PMH and Problem List.  SH:  Social History   Social History  . Marital status: Married    Spouse name: N/A  . Number of children: 4  . 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  Smoking status: Never Smoker  . Smokeless tobacco: Never Used  . Alcohol use 1.8 oz/week    3 Cans of beer per week     Comment:  "may have 2-3 beers on the weekend"  . Drug use: No  . Sexual activity: Not on file   Other Topics Concern  . Not on file   Social History Narrative  . No narrative on file    FH:  Family History  Problem Relation Age of Onset  . Heart failure Mother   . Diabetes Mother   . Prostate cancer Father     Current Outpatient Prescriptions  Medication Sig Dispense Refill  . allopurinol (ZYLOPRIM) 300 MG tablet Take 300 mg by mouth daily.     Marland Kitchen aspirin 81 MG chewable tablet Chew 1 tablet (81 mg total) by mouth daily. 30 tablet 6  . colchicine 0.6 MG tablet Take 0.6 mg by mouth daily as needed (for gout).     Marland Kitchen docusate sodium (COLACE) 100 MG capsule Take 100 mg by mouth daily.    Marland Kitchen losartan (COZAAR) 25 MG tablet Take 0.5 tablets (12.5 mg total) by mouth at bedtime. 30 tablet 11  . metoprolol succinate (TOPROL-XL) 25 MG 24 hr tablet Take 12.5 mg by mouth daily.    . Psyllium (METAMUCIL PO) Take 2 capsules by mouth 2 (two) times daily.     Marland Kitchen spironolactone (ALDACTONE) 25 MG tablet Take 0.5 tablets (12.5 mg total) by mouth daily after supper. Take 1/2 tab once daily    . tamsulosin (FLOMAX) 0.4 MG CAPS capsule Take 0.4 mg by mouth every other day.     . tolterodine (DETROL LA) 4 MG 24 hr capsule Take 4 mg by mouth daily.   0  . warfarin (COUMADIN) 5 MG tablet Take 0.5-1 tablets (2.5-5 mg total) by mouth See admin instructions. Take 5 mg by mouth Monday, Wednesday, Friday and Sunday. Take 2.5 mg by mouth on all other days. (Patient taking differently: Take 2.5-5 mg by mouth See admin instructions. Takes 5 mg on Tuesday, Thursday, and Saturday and takes 2.5 mg (1/2) tab on Sun, Mon, Wed, and Fri) 45 tablet 6  . torsemide (DEMADEX) 20 MG tablet Take 1 tablet (20 mg total) by mouth PC lunch. AS NEEDED     No current facility-administered medications for this  encounter.     Vitals:   03/13/17 0858  BP: 110/65  Pulse: 79  SpO2: 99%  Weight: 184 lb 4 oz (83.6 kg)   Filed Weights   03/13/17 0858  Weight: 184 lb 4 oz (83.6 kg)   PHYSICAL EXAM: General: NAD Neck: No JVD, no thyromegaly or thyroid nodule.  Lungs: Clear to auscultation bilaterally with normal respiratory effort. CV: Nondisplaced PMI.  Heart regular S1/S2 with mechanical S2, no S3/S4, 2/6 SEM RUSB.  No peripheral edema.  No carotid bruit.  Normal pedal pulses.  Abdomen: Soft, nontender, no hepatosplenomegaly, no distention.  Skin: Intact without lesions or rashes.  Neurologic: Alert and oriented x 3.  Psych: Normal affect. Extremities: No clubbing or cyanosis.  HEENT: Normal.   ASSESSMENT & PLAN: 1. Chronic systolic CHF: EF on TEE in 1/18 20-25%, mechanical aortic valve ok.  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  He required milrinone to assist diuresis during 1/18-2/18 admission.  Now that he is back in NSR s/p atrial fibrillation ablation, echo in 5/18 showed EF back up to 45%. Currently, NYHA class II symptoms and does not appear volume overloaded.  Medication titration has been limited by lightheadedness, he is doing ok on current regimen.  - He may continue prn use of torsemide as long as weight and symptoms remain stable.   - he is now off digoxin.  - Continue spironolactone 12.5 daily.   - Can continue Toprol XL and losartan.  - Given no ACS/chest pain and improvement in EF back to his baseline (45%), I think we can defer coronary angiography.  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    2. Mechanical aortic valve: Continue warfarin and ASA 81. Valve looked ok on 5/18 echo. 3. Atrial fibrillation: Admitted 1/18 with atrial fibrillation with RVR and CHF.  EF down to 20-25%.  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  Need to keep him in NSR.  DCCV during 2/18 admission and now s/p atrial fibrillation ablation.  He is in NSR today.  -  He is now off amiodarone.  4. CKD: Stage III.   5. Syncope: Suspect prior episode of syncope was due to over-diuresis (orthostatic).  6. Ascending aortic aneurysm: 4.4 cm on 5/18 echo.  Will get MRA chest to follow in 5/19.   Followup in 4 months.   [name] 03/14/2017

## 2017-03-16 ENCOUNTER — Encounter (HOSPITAL_COMMUNITY): Payer: Medicare Other

## 2017-03-20 NOTE — Progress Notes (Signed)
Discharge Progress Report  Patient Details  Name: Adrian Lambert MRN: [id number] Date of Birth: 04-14-1941 Referring Provider:     CARDIAC REHAB PHASE II ORIENTATION from 11/16/2016 in [facility]  Referring Provider  [name] MD       Number of Visits: 36   Reason for Discharge:  Patient has met program and personal goals.  Smoking History:  History  Smoking Status  . Never Smoker  Smokeless Tobacco  . Never Used    Diagnosis:  Heart failure, chronic systolic (HCC)  ADL UCSD:   Initial Exercise Prescription:     Initial Exercise Prescription - 11/16/16 1300      Date of Initial Exercise RX and Referring Provider   Date 11/16/16   Referring Provider [name] MD     Treadmill   MPH 1.7   Grade 0   Minutes 10   METs 2.3     Recumbant Bike   Level 1.5   Minutes 10   METs 1     NuStep   Level 2   SPM 70   Minutes 10   METs 2     Prescription Details   Frequency (times per week) 3   Duration Progress to 30 minutes of continuous aerobic without signs/symptoms of physical distress     Intensity   THRR 40-80% of Max Heartrate 58-116   Ratings of Perceived Exertion 11-13   Perceived Dyspnea 0-4     Progression   Progression Continue to progress workloads to maintain intensity without signs/symptoms of physical distress.     Resistance Training   Training Prescription Yes   Weight 2lbs   Reps 10-15      Discharge Exercise Prescription (Final Exercise Prescription Changes):     Exercise Prescription Changes - 03/06/17 1100      Response to Exercise   Blood Pressure (Admit) 98/64   Blood Pressure (Exercise) 112/60   Blood Pressure (Exit) 107/68  recheck after water 99/61   Heart Rate (Admit) 75 bpm   Heart Rate (Exercise) 110 bpm   Heart Rate (Exit) 86 bpm   Rating of Perceived Exertion (Exercise) 12   Symptoms none   Duration Continue with 30 min of aerobic exercise without signs/symptoms of  physical distress.   Intensity THRR unchanged     Progression   Progression Continue to progress workloads to maintain intensity without signs/symptoms of physical distress.   Average METs 3.5     Resistance Training   Training Prescription Yes   Weight 5lbs   Reps 10-15   Time 10 Minutes     Treadmill   MPH 2.5   Grade 1   Minutes 10   METs 3.26     Recumbant Bike   Level 3   Minutes 10   METs 3.2     NuStep   Level 4   SPM 100   Minutes 10   METs 3.9     Home Exercise Plan   Plans to continue exercise at Home (comment)  on treadmill 2(15' bouts)   Frequency Add 2 additional days to program exercise sessions.   Initial Home Exercises Provided 12/11/16      Functional Capacity:     6 Minute Walk    Row Name 11/16/16 1327 02/23/17 1006 03/06/17 1158     6 Minute Walk   Phase Initial Discharge  -   Distance 1046 feet 1498 feet  -   Distance % Change  -  -  43.21 %   Walk Time 6 minutes 6 minutes  -   # of Rest Breaks 0 0  -   MPH 1.98 2.83  -   METS 1.75 2.76  -   RPE 12 12  -   VO2 Peak 6.13 9.65  -   Symptoms No No  -   Resting HR 70 bpm 88 bpm  -   Resting BP 114/70 100/60  -   Max Ex. HR 77 bpm 95 bpm  -   Max Ex. BP 110/62 108/60  -   2 Minute Post BP 108/64 104/64  -      Psychological, QOL, Others - Outcomes: PHQ 2/9: Depression screen [facility] 2/9 03/06/2017 11/20/2016  Decreased Interest 0 0  Down, Depressed, Hopeless 0 0  PHQ - 2 Score 0 0    Quality of Life:     Quality of Life - 03/05/17 1114      Quality of Life Scores   Health/Function Pre 24.47 %   Health/Function Post 24.21 %   Health/Function % Change -1.06 %   Socioeconomic Pre 24.64 %   Socioeconomic Post 24.21 %   Socioeconomic % Change  -1.75 %   Psych/Spiritual Pre 30 %   Psych/Spiritual Post 26.57 %   Psych/Spiritual % Change -11.43 %   Family Pre 26.4 %   Family Post 27.6 %   Family % Change 4.55 %   GLOBAL Pre 25.93 %   GLOBAL Post 25.23 %   GLOBAL % Change -2.7  %      Personal Goals: Goals established at orientation with interventions provided to work toward goal.     Personal Goals and Risk Factors at Admission - 11/16/16 1337      Core Components/Risk Factors/Patient Goals on Admission    Weight Management Yes;Weight Loss;Weight Maintenance   Intervention Weight Management: Develop a combined nutrition and exercise program designed to reach desired caloric intake, while maintaining appropriate intake of nutrient and fiber, sodium and fats, and appropriate energy expenditure required for the weight goal.;Weight Management: Provide education and appropriate resources to help participant work on and attain dietary goals.;Weight Management/Obesity: Establish reasonable short term and long term weight goals.   Expected Outcomes Short Term: Continue to assess and modify interventions until short term weight is achieved;Long Term: Adherence to nutrition and physical activity/exercise program aimed toward attainment of established weight goal;Weight Maintenance: Understanding of the daily nutrition guidelines, which includes 25-35% calories from fat, 7% or less cal from saturated fats, less than 288m cholesterol, less than 1.5gm of sodium, & 5 or more servings of fruits and vegetables daily;Weight Loss: Understanding of general recommendations for a balanced deficit meal plan, which promotes 1-2 lb weight loss per week and includes a negative energy balance of [phone] kcal/d;Understanding recommendations for meals to include 15-35% energy as protein, 25-35% energy from fat, 35-60% energy from carbohydrates, less than 2037mof dietary cholesterol, 20-35 gm of total fiber daily;Understanding of distribution of calorie intake throughout the day with the consumption of 4-5 meals/snacks   Hypertension Yes   Intervention Provide education on lifestyle modifcations including regular physical activity/exercise, weight management, moderate sodium restriction and  increased consumption of fresh fruit, vegetables, and low fat dairy, alcohol moderation, and smoking cessation.;Monitor prescription use compliance.   Expected Outcomes Short Term: Continued assessment and intervention until BP is < 140/9036mG in hypertensive participants. < 130/38m17m in hypertensive participants with diabetes, heart failure or chronic kidney disease.;Long Term: Maintenance of blood pressure  at goal levels.   Personal Goal Other Yes   Personal Goal Learn better medication management   Intervention Provide cardiac education classes geared toward medication management and risk factor modification to assist with improving pt's overall quality of life   Expected Outcomes Pt have increased knowledge and understanding of medication and modifiable risk factors       Personal Goals Discharge:     Goals and Risk Factor Review    Row Name 12/13/16 1641 01/01/17 1731 02/05/17 1215 03/06/17 0900 03/20/17 0714     Core Components/Risk Factors/Patient Goals Review   Personal Goals Review Weight Management/Obesity;Hypertension;Other Weight Management/Obesity;Hypertension;Other Weight Management/Obesity;Hypertension;Other Weight Management/Obesity;Hypertension;Other Weight Management/Obesity;Hypertension;Other   Review pt demonstrates eagerness to participate in CR activities.  pt demonstrates eagerness to participate in CR activities.  pt demonstrates eagerness to participate in CR activities.  pt demonstrates eagerness to participate in CR activities.  pt with excellent participation in CR activities. pt plans to continue exercising on his own.    Expected Outcomes pt will participate in CR exercise, nutrition and lifestyle education opportunities to decrease overall CAD risk factors.  pt will participate in CR exercise, nutrition and lifestyle education opportunities to decrease overall CAD risk factors.  pt will participate in CR exercise, nutrition and lifestyle education opportunities to  decrease overall CAD risk factors.  pt will participate in CR exercise, nutrition and lifestyle education opportunities to decrease overall CAD risk factors.  pt will continue exercise, nutrition and lifestyle modifications to decrease overall CAD risk factors.       Exercise Goals and Review:     Exercise Goals    Row Name 11/16/16 0810             Exercise Goals   Increase Physical Activity Yes       Intervention Provide advice, education, support and counseling about physical activity/exercise needs.;Develop an individualized exercise prescription for aerobic and resistive training based on initial evaluation findings, risk stratification, comorbidities and participant's personal goals.       Expected Outcomes Achievement of increased cardiorespiratory fitness and enhanced flexibility, muscular endurance and strength shown through measurements of functional capacity and personal statement of participant.       Increase Strength and Stamina Yes       Intervention Provide advice, education, support and counseling about physical activity/exercise needs.;Develop an individualized exercise prescription for aerobic and resistive training based on initial evaluation findings, risk stratification, comorbidities and participant's personal goals.  get back to hiking and fishing. Learn how to exercise safely.       Expected Outcomes Achievement of increased cardiorespiratory fitness and enhanced flexibility, muscular endurance and strength shown through measurements of functional capacity and personal statement of participant.          Nutrition & Weight - Outcomes:     Pre Biometrics - 11/16/16 1328      Pre Biometrics   Waist Circumference 42 inches   Hip Circumference 40 inches   Waist to Hip Ratio 1.05 %   Triceps Skinfold 14 mm   % Body Fat 27.7 %   Grip Strength 35 kg   Flexibility 9.25 in   Single Leg Stand 1.65 seconds         Post Biometrics - 02/23/17 1006       Post   Biometrics   Height 5' 8"  (1.727 m)   Weight 183 lb 6.8 oz (83.2 kg)   Waist Circumference 38 inches   Hip Circumference 39.75 inches   Waist to  Hip Ratio 0.96 %   BMI (Calculated) 27.9   Triceps Skinfold 14 mm   % Body Fat 26.6 %   Grip Strength 38 kg   Flexibility 10 in   Single Leg Stand 1.62 seconds      Nutrition:     Nutrition Therapy & Goals - 03/16/17 1131      Nutrition Therapy   Diet Therapeutic Lifestyle Changes     Personal Nutrition Goals   Nutrition Goal Pt to describe the benefit of including fruits, vegetables, whole grains, and low-fat dairy products in a heart healthy meal plan.     Intervention Plan   Intervention Prescribe, educate and counsel regarding individualized specific dietary modifications aiming towards targeted core components such as weight, hypertension, lipid management, diabetes, heart failure and other comorbidities.   Expected Outcomes Short Term Goal: Understand basic principles of dietary content, such as calories, fat, sodium, cholesterol and nutrients.;Long Term Goal: Adherence to prescribed nutrition plan.      Nutrition Discharge:     Nutrition Assessments - 03/16/17 1130      MEDFICTS Scores   Pre Score 27   Post Score 22   Score Difference -5      Education Questionnaire Score:     Knowledge Questionnaire Score - 03/05/17 1114      Knowledge Questionnaire Score   Post Score 22/24      Goals reviewed with patient; copy given to patient.

## 2017-03-21 ENCOUNTER — Ambulatory Visit (HOSPITAL_BASED_OUTPATIENT_CLINIC_OR_DEPARTMENT_OTHER): Payer: Medicare Other | Admitting: Family

## 2017-03-21 ENCOUNTER — Other Ambulatory Visit (HOSPITAL_BASED_OUTPATIENT_CLINIC_OR_DEPARTMENT_OTHER): Payer: Medicare Other

## 2017-03-21 VITALS — BP 104/66 | HR 81 | Temp 98.0°F | Resp 18 | Wt 182.0 lb

## 2017-03-21 DIAGNOSIS — D509 Iron deficiency anemia, unspecified: Secondary | ICD-10-CM

## 2017-03-21 DIAGNOSIS — D500 Iron deficiency anemia secondary to blood loss (chronic): Secondary | ICD-10-CM

## 2017-03-21 DIAGNOSIS — Z7901 Long term (current) use of anticoagulants: Secondary | ICD-10-CM | POA: Diagnosis not present

## 2017-03-21 DIAGNOSIS — Z952 Presence of prosthetic heart valve: Secondary | ICD-10-CM | POA: Diagnosis not present

## 2017-03-21 LAB — CBC WITH DIFFERENTIAL (CANCER CENTER ONLY)
BASO#: 0.1 10*3/uL (ref 0.0–0.2)
BASO%: 0.5 % (ref 0.0–2.0)
EOS ABS: 0.4 10*3/uL (ref 0.0–0.5)
EOS%: 4.6 % (ref 0.0–7.0)
HCT: 39.3 % (ref 38.7–49.9)
HGB: 13.2 g/dL (ref 13.0–17.1)
LYMPH#: 1.8 10*3/uL (ref 0.9–3.3)
LYMPH%: 19.7 % (ref 14.0–48.0)
MCH: 20.8 pg — AB (ref 28.0–33.4)
MCHC: 33.6 g/dL (ref 32.0–35.9)
MCV: 62 fL — AB (ref 82–98)
MONO#: 0.8 10*3/uL (ref 0.1–0.9)
MONO%: 9.1 % (ref 0.0–13.0)
NEUT#: 6.1 10*3/uL (ref 1.5–6.5)
NEUT%: 66.1 % (ref 40.0–80.0)
PLATELETS: 219 10*3/uL (ref 145–400)
RBC: 6.35 10*6/uL — AB (ref 4.20–5.70)
RDW: 18.5 % — ABNORMAL HIGH (ref 11.1–15.7)
WBC: 9.3 10*3/uL (ref 4.0–10.0)

## 2017-03-21 LAB — CMP (CANCER CENTER ONLY)
ALT(SGPT): 38 U/L (ref 10–47)
AST: 46 U/L — ABNORMAL HIGH (ref 11–38)
Albumin: 4 g/dL (ref 3.3–5.5)
Alkaline Phosphatase: 115 U/L — ABNORMAL HIGH (ref 26–84)
BUN: 17 mg/dL (ref 7–22)
CHLORIDE: 104 meq/L (ref 98–108)
CO2: 30 meq/L (ref 18–33)
Calcium: 9.1 mg/dL (ref 8.0–10.3)
Creat: 1.1 mg/dl (ref 0.6–1.2)
Glucose, Bld: 115 mg/dL (ref 73–118)
POTASSIUM: 4.3 meq/L (ref 3.3–4.7)
Sodium: 143 mEq/L (ref 128–145)
TOTAL PROTEIN: 7.3 g/dL (ref 6.4–8.1)
Total Bilirubin: 2 mg/dl — ABNORMAL HIGH (ref 0.20–1.60)

## 2017-03-21 LAB — FERRITIN: FERRITIN: 437 ng/mL — AB (ref 22–316)

## 2017-03-21 LAB — IRON AND TIBC
%SAT: 37 % (ref 20–55)
IRON: 99 ug/dL (ref 42–163)
TIBC: 269 ug/dL (ref 202–409)
UIBC: 170 ug/dL (ref 117–376)

## 2017-03-21 MED ORDER — FOLIC ACID 1 MG PO TABS: 1.0000 mg | ORAL_TABLET | Freq: Every day | ORAL | 11 refills | Status: DC

## 2017-03-21 NOTE — Progress Notes (Signed)
Hematology and Oncology Follow Up Visit  Adrian Lambert [id number] 04-03-1941 76 y.o. 03/21/2017   Principle Diagnosis:  Iron deficiency anemia   Current Therapy:   Observation   Interim History:  Adrian Lambert is here today for follow-up. He is doing well and has no complaints at this time.  He has had no episodes of bleeding. He does bruise easily on Coumadin.  His Hgb is now up to 13.2 with an MCV of 62. We will have him start taking folic acid daily and see if this doesn't help. He has not required intervention with IV iron or Aranesp so far.  He denies fever, chills, n/v, cough, rash, dizziness, SOB, chest pain, palpitations, abdominal pain or changes in bowel or bladder habits.  He has not felt fatigued and is enjoying being back out in his yard tinkering about.  No swelling, tenderness, numbness or tingling in his extremities at this time. No c/o pain.  No lymphadenopathy found on exam.  He has maintained a good appetite and is staying well hydrated. Her weight is stable.  He is also walking on his treadmill daily for exercise.   ECOG Performance Status: 0 - Asymptomatic  Medications:  Allergies as of 03/21/2017      Reactions   Celecoxib Rash, Other (See Comments)   Celebrex      Medication List       Accurate as of 03/21/17  9:45 AM. Always use your most recent med list.          allopurinol 300 MG tablet Commonly known as:  ZYLOPRIM Take 300 mg by mouth daily.   aspirin 81 MG chewable tablet Chew 1 tablet (81 mg total) by mouth daily.   colchicine 0.6 MG tablet Take 0.6 mg by mouth daily as needed (for gout).   docusate sodium 100 MG capsule Commonly known as:  COLACE Take 100 mg by mouth daily.   losartan 25 MG tablet Commonly known as:  COZAAR Take 0.5 tablets (12.5 mg total) by mouth at bedtime.   METAMUCIL PO Take 2 capsules by mouth 2 (two) times daily.   metoprolol succinate 25 MG 24 hr tablet Commonly known as:  TOPROL-XL Take 12.5 mg by  mouth daily.   spironolactone 25 MG tablet Commonly known as:  ALDACTONE Take 0.5 tablets (12.5 mg total) by mouth daily after supper. Take 1/2 tab once daily   tamsulosin 0.4 MG Caps capsule Commonly known as:  FLOMAX Take 0.4 mg by mouth every other day.   tolterodine 4 MG 24 hr capsule Commonly known as:  DETROL LA Take 4 mg by mouth daily.   torsemide 20 MG tablet Commonly known as:  DEMADEX Take 1 tablet (20 mg total) by mouth PC lunch. AS NEEDED   warfarin 5 MG tablet Commonly known as:  COUMADIN Take 0.5-1 tablets (2.5-5 mg total) by mouth See admin instructions. Take 5 mg by mouth Monday, Wednesday, Friday and Sunday. Take 2.5 mg by mouth on all other days.       Allergies:  Allergies  Allergen Reactions  . Celecoxib Rash and Other (See Comments)    Celebrex    Past Medical History, Surgical history, Social history, and Family History were reviewed and updated.  Review of Systems: All other 10 point review of systems is negative.   Physical Exam:  vitals were not taken for this visit.  Wt Readings from Last 3 Encounters:  03/13/17 184 lb 4 oz (83.6 kg)  03/13/17 192 lb 7.4 oz (  87.3 kg)  02/28/17 182 lb 12.8 oz (82.9 kg)    Ocular: Sclerae unicteric, pupils equal, round and reactive to light Ear-nose-throat: Oropharynx clear, dentition fair Lymphatic: No cervical, supraclavicular or axillary adenopathy Lungs no rales or rhonchi, good excursion bilaterally Heart regular rate and rhythm, no murmur appreciated Abd soft, nontender, positive bowel sounds, no liver or spleen tip palpated on exam, no fluid wave MSK no focal spinal tenderness, no joint edema Neuro: non-focal, well-oriented, appropriate affect Breasts: Deferred  Lab Results  Component Value Date   WBC 9.3 03/21/2017   HGB 13.2 03/21/2017   HCT 39.3 03/21/2017   MCV 62 (L) 03/21/2017   PLT 219 03/21/2017   Lab Results  Component Value Date   FERRITIN 564 (H) 02/08/2017   IRON 105  02/08/2017   TIBC 264 02/08/2017   UIBC 158 02/08/2017   IRONPCTSAT 40 02/08/2017   Lab Results  Component Value Date   RBC 6.35 (H) 03/21/2017   No results found for: KPAFRELGTCHN, LAMBDASER, KAPLAMBRATIO No results found for: IGGSERUM, IGA, IGMSERUM No results found for: Ronnald Ramp, A1GS, A2GS, Violet Baldy, MSPIKE, SPEI   Chemistry      Component Value Date/Time   NA 137 02/08/2017 1401   K 3.8 02/08/2017 1401   CL 102 02/08/2017 1401   CO2 31 02/08/2017 1401   BUN 18 02/08/2017 1401   CREATININE 0.9 02/08/2017 1401      Component Value Date/Time   CALCIUM 9.2 02/08/2017 1401   ALKPHOS 128 (H) 02/08/2017 1401   AST 44 (H) 02/08/2017 1401   ALT 31 02/08/2017 1401   BILITOT 1.60 02/08/2017 1401      Impression and Plan: Adrian Lambert is a very pleasant 76 yo caucasian gentleman with history of anemia. He is doing well and his Hgb is now 13.2 with low MCV of 62. He will try taking folic acid daily for now and we will plan to see him back in another 3 months for repeat lab work and follow-up.  We will see what his iron studies show and bring him back in next week for an infusion if needed.  He is in agreement with the plan and will contact our office with any questions or concerns. We can certainly see him sooner if need be.   Eliezer Bottom, NP 8/29/20189:45 AM

## 2017-03-22 ENCOUNTER — Other Ambulatory Visit: Payer: Medicare Other

## 2017-03-22 ENCOUNTER — Ambulatory Visit: Payer: Medicare Other | Admitting: Family

## 2017-03-25 NOTE — Progress Notes (Signed)
Adrian Lambert Date of Birth: December 24, 1940 Medical Record #[id number]  History of Present Illness: [name] is seen for followup AVR and CHF. He has a history of severe aortic stenosis and is status post aortic valve replacement with a #25 mm St. Jude mechanical prosthesis September 2004.   He is on chronic Coumadin therapy. He has a history of obesity and HTN. Echo in 2017 showed EF 40-45%.  He was admitted 1/23 through 08/29/16 with marked volume overload, lower extremity edema, and atrial fibrillation with RVR.  Initial cardioversion failed and he was difficult to diurese. He was placed on milrinone for low output and diuresed with IV lasix with good response.  Weight came down considerably. Amiodarone was begun. Once fully diuresed, he had successful DC-CV on 08/28/16.  On post hospital appointment, he was back in atrial fibrillation.  He was set up for DCCV in 3/18, but was in NSR when he arrived to short stay. He was later seen by Dr. Curt Lambert and underwent Afib ablation on 11/28/16. He has maintained NSR on follow up. He is also followed in the CHF clinic. Repeat Echo in May showed improvement in EF to 45%. His amiodarone was discontinued on 12/26/16. His digoxin was discontinued one month ago. Titration of his medications has been limited by orthostatic symptoms.   On follow up today he is doing very well. He has completed Cardiac Rehab. No dyspnea. Monitors weight daily and it has been stable. No palpitations. No dizziness or chest pain. No edema. Using Torsemide prn now.   Current Outpatient Prescriptions on File Prior to Visit  Medication Sig Dispense Refill  . allopurinol (ZYLOPRIM) 300 MG tablet Take 300 mg by mouth daily.     Marland Kitchen aspirin 81 MG chewable tablet Chew 1 tablet (81 mg total) by mouth daily. 30 tablet 6  . colchicine 0.6 MG tablet Take 0.6 mg by mouth daily as needed (for gout).     Marland Kitchen docusate sodium (COLACE) 100 MG capsule Take 100 mg by mouth daily.    . folic acid (FOLVITE) 1 MG  tablet Take 1 tablet (1 mg total) by mouth daily. 30 tablet 11  . losartan (COZAAR) 25 MG tablet Take 0.5 tablets (12.5 mg total) by mouth at bedtime. 30 tablet 11  . metoprolol succinate (TOPROL-XL) 25 MG 24 hr tablet Take 12.5 mg by mouth daily.    . Psyllium (METAMUCIL PO) Take 2 capsules by mouth 2 (two) times daily.     Marland Kitchen spironolactone (ALDACTONE) 25 MG tablet Take 0.5 tablets (12.5 mg total) by mouth daily after supper. Take 1/2 tab once daily    . tamsulosin (FLOMAX) 0.4 MG CAPS capsule Take 0.4 mg by mouth every other day.     . tolterodine (DETROL LA) 4 MG 24 hr capsule Take 4 mg by mouth daily.   0   No current facility-administered medications on file prior to visit.     Allergies  Allergen Reactions  . Celecoxib Rash and Other (See Comments)    Celebrex    Past Medical History:  Diagnosis Date  . Anticoagulant long-term use   . Arthritis    "probably in my thumbs" (06/26/2012)  . Bifascicular block   . Chronic combined systolic and diastolic CHF (congestive heart failure) ([place])    a. 01/2016 Echo: EF 45-50%, gr2 DD, inflat AK (poor acoustic windows);  b. 07/2016 Echo: EF 25-30%, mild LVH, PASP 69mmHg (pt in Afib).  . Complication of anesthesia    "wake up  w/a start; hallucinations" (06/26/2012)  . Critical Aortic Stenosis    a. 03/2003 s/p SJM #25 mech prosthetic AoV;  b. 07/2016 Echo: EF 25-30% (in setting of AF), AoV area 1.72 cm^2 (VTI), 1.75 cm^2 (Vmean).  . Diverticulosis   . Gouty arthritis    "have had it in both feet, ankles, right knee" (06/26/2012)  . History of diverticulitis of colon   . History of kidney stones   . History of pancreatitis   . Hypertension   . Incomplete RBBB   . PAF (paroxysmal atrial fibrillation) (HCC)    a. CHA2DS2VASc = 4-->chronic coumadin in setting of mech AoV;  b. 07/2016 Recurrent AF RVR.    Past Surgical History:  Procedure Laterality Date  . AORTIC VALVE REPLACEMENT  04-14-2003  DR Adrian Lambert   #42mm ST JUDE MECHNICAL PROSTHESIS   . ATRIAL FIBRILLATION ABLATION N/A 11/28/2016   Procedure: Atrial Fibrillation Ablation;  Surgeon: Adrian Haw, MD;  Location: [facility];  Service: Cardiovascular;  Laterality: N/A;  . CARDIOVERSION N/A 08/18/2016   Procedure: CARDIOVERSION;  Surgeon: Adrian Headings, MD;  Location: [facility] ENDOSCOPY;  Service: Cardiovascular;  Laterality: N/A;  . CARDIOVERSION N/A 08/28/2016   Procedure: CARDIOVERSION;  Surgeon: Adrian Dresser, MD;  Location: [place];  Service: Cardiovascular;  Laterality: N/A;  . CATARACT EXTRACTION W/ INTRAOCULAR LENS  IMPLANT, BILATERAL  RIGHT 2010/  LEFT DEC 2013  . HEMICOLECTOMY  10/06/2003   Laparoscopic-assisted left hemicolectomy for diverticulitis  . LAPAROSCOPIC CHOLECYSTECTOMY  07-23-2006  . PROSTATE BIOPSY  10/09/2012   gleason 3+4=7  . RADIOACTIVE SEED IMPLANT N/A 01/09/2013   Procedure: RADIOACTIVE SEED IMPLANT;  Surgeon: Adrian Gallo, MD;  Location: [facility];  Service: Urology;  Laterality: N/A;  . TEE WITHOUT CARDIOVERSION N/A 08/18/2016   Procedure: TRANSESOPHAGEAL ECHOCARDIOGRAM (TEE);  Surgeon: Adrian Headings, MD;  Location: [facility];  Service: Cardiovascular;  Laterality: N/A;  . TRANSTHORACIC ECHOCARDIOGRAM  04-28-2008   MILD LVH / NORMAL LSF/ NORMAL AORTIC VALVE MECHANICAL PROSTHESIS FUNCTION/ MILD LAE/ EF 55-60%    History  Smoking Status  . Never Smoker  Smokeless Tobacco  . Never Used    History  Alcohol Use  . 1.8 oz/week  . 3 Cans of beer per week    Comment:  "may have 2-3 beers on the weekend"    Family History  Problem Relation Age of Onset  . Heart failure Mother   . Diabetes Mother   . Prostate cancer Father     Review of Systems: As noted in history of present illness.  All other systems were reviewed and are negative.  Physical Exam: BP 110/71   Pulse 75   Ht 5\' 10"  (1.778 m)   Wt 185 lb 12.8 oz (84.3 kg)   SpO2 98%   BMI 26.66 kg/m  WD  WM in NAD.   The HEENT exam is  normal.   The carotids are 2+ without bruits.  There is no thyromegaly.  There is no JVD.  The lungs are clear.  The heart exam reveals a regular rate with a good mechanical aortic valve click.  There are no murmurs, gallops, or rubs.  The PMI is not displaced.   Abdominal exam reveals good bowel sounds.  There is no guarding or rebound.  There is no hepatosplenomegaly or tenderness.  There are no masses.  Exam of the legs reveal tr edema.  The legs are without rashes.  The distal pulses are intact.  Some bruising  on forearms. Cranial nerves II - XII are intact.  Motor and sensory functions are intact.  The gait is normal.  LABORATORY DATA: Lab Results  Component Value Date   WBC 9.3 03/21/2017   HGB 13.2 03/21/2017   HCT 39.3 03/21/2017   PLT 219 03/21/2017   GLUCOSE 115 03/21/2017   ALT 38 03/21/2017   AST 46 (H) 03/21/2017   NA 143 03/21/2017   K 4.3 03/21/2017   CL 104 03/21/2017   CREATININE 1.1 03/21/2017   BUN 17 03/21/2017   CO2 30 03/21/2017   TSH 3.210 12/05/2016   INR 1.87 12/05/2016   Lipids dated 12/06/16: cholesterol 141, triglycerides 186, HDL 30, LDL 74.   Echo 12/05/16: Study Conclusions  - Left ventricle: The cavity size was normal. Wall thickness was   increased in a pattern of mild LVH. The estimated ejection   fraction was 45%. Diffuse hypokinesis. Doppler parameters are   consistent with abnormal left ventricular relaxation (grade 1   diastolic dysfunction). - Aortic valve: There is a Probation officer aortic valve. No   significant stenosis. There was trivial regurgitation. Mean   gradient (S): 12 mm Hg. - Aorta: Dilated ascending aorta. Ascending aortic diameter: 44 mm   (S). - Mitral valve: Mildly calcified annulus. There was trivial   regurgitation. - Left atrium: The atrium was mildly dilated. - Right ventricle: The cavity size was normal. Systolic function   was mildly reduced. - Tricuspid valve: Peak RV-RA gradient (S): 21 mm Hg. - Pulmonary  arteries: PA peak pressure: 24 mm Hg (S). - Inferior vena cava: The vessel was normal in size. The   respirophasic diameter changes were in the normal range (>= 50%),   consistent with normal central venous pressure.  Impressions:  - Normal LV size with mild LV hypertrophy. EF 45%, diffuse   hypokinesis. Normal RV size with mildly decreased systolic   function. Mechanical aortic valve functioning normally.   Assessment / Plan: 1. History of severe AS s/p mechanical AVR in 2004. Normal valve function by exam and Echo.  Continue long term coumadin therapy- INR followed by Dr. Rex Kras. On ASA 81 mg daily. Recommendations for SBE prophylaxis reviewed.   2. . Chronic systolic CHF: EF on TEE in 1/18 20-25%, mechanical aortic valve ok. 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 Since Afib ablation and restoration of NSR EF improved to 45% which was his baseline. Currently, NYHA class II symptoms and does not appear volume overloaded. Weight is stable.   - Continue torsemide prn now - Continue spironolactone  - Can continue Toprol XL and losartan at current doses.   3. Atrial fibrillation: Admitted 1/18 with atrial fibrillation with RVR and CHF. EF down to 20-25%.  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 Need to keep himin NSR. DCCV during 2/18 admission and now s/p atrial fibrillation ablation.  He is in NSR today.  - amiodarone and digoxin discontinued.  4. CKD: Stage III.  stable.  5. HTN- controlled.  6. Ascending aortic aneurysm: 4.4 cm on 5/18 echo.  F/u with MRI one year.   I will follow up in 6 months.

## 2017-03-29 ENCOUNTER — Ambulatory Visit (INDEPENDENT_AMBULATORY_CARE_PROVIDER_SITE_OTHER): Payer: Medicare Other | Admitting: Cardiology

## 2017-03-29 ENCOUNTER — Encounter: Payer: Self-pay | Admitting: Cardiology

## 2017-03-29 VITALS — BP 110/71 | HR 75 | Ht 70.0 in | Wt 185.8 lb

## 2017-03-29 DIAGNOSIS — I480 Paroxysmal atrial fibrillation: Secondary | ICD-10-CM

## 2017-03-29 DIAGNOSIS — I5042 Chronic combined systolic (congestive) and diastolic (congestive) heart failure: Secondary | ICD-10-CM

## 2017-03-29 DIAGNOSIS — Z952 Presence of prosthetic heart valve: Secondary | ICD-10-CM | POA: Diagnosis not present

## 2017-03-29 DIAGNOSIS — I428 Other cardiomyopathies: Secondary | ICD-10-CM | POA: Diagnosis not present

## 2017-03-29 NOTE — Patient Instructions (Signed)
Continue your current therapy  I will see you in 6 months.   

## 2017-04-04 DIAGNOSIS — Z952 Presence of prosthetic heart valve: Secondary | ICD-10-CM | POA: Diagnosis not present

## 2017-04-04 DIAGNOSIS — Z7901 Long term (current) use of anticoagulants: Secondary | ICD-10-CM | POA: Diagnosis not present

## 2017-04-18 DIAGNOSIS — Z23 Encounter for immunization: Secondary | ICD-10-CM | POA: Diagnosis not present

## 2017-04-18 DIAGNOSIS — Z7901 Long term (current) use of anticoagulants: Secondary | ICD-10-CM | POA: Diagnosis not present

## 2017-05-09 DIAGNOSIS — Z7901 Long term (current) use of anticoagulants: Secondary | ICD-10-CM | POA: Diagnosis not present

## 2017-05-09 DIAGNOSIS — Z952 Presence of prosthetic heart valve: Secondary | ICD-10-CM | POA: Diagnosis not present

## 2017-05-30 ENCOUNTER — Encounter: Payer: Self-pay | Admitting: Cardiology

## 2017-05-30 ENCOUNTER — Ambulatory Visit (INDEPENDENT_AMBULATORY_CARE_PROVIDER_SITE_OTHER): Payer: Medicare Other | Admitting: Cardiology

## 2017-05-30 VITALS — BP 107/73 | HR 83 | Ht 70.0 in | Wt 186.0 lb

## 2017-05-30 DIAGNOSIS — I428 Other cardiomyopathies: Secondary | ICD-10-CM

## 2017-05-30 DIAGNOSIS — Z952 Presence of prosthetic heart valve: Secondary | ICD-10-CM

## 2017-05-30 DIAGNOSIS — I480 Paroxysmal atrial fibrillation: Secondary | ICD-10-CM

## 2017-05-30 NOTE — Progress Notes (Signed)
Electrophysiology Office Note   Date:  05/30/2017   ID:  AXYL SITZMAN, DOB March 03, 1941, MRN [id number]  PCP:  Adrian Fess, MD  Cardiologist:  [place], [place] Primary Electrophysiologist:  Constance Haw, MD    Chief Complaint  Patient presents with  . Follow-up    PAF     History of Present Illness: Adrian Lambert is a 76 y.o. male who is being seen today for the evaluation of atrial fibrillation at the request of Adrian Fess, MD. Presenting today for electrophysiology evaluation. 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 He was admitted on 123 with volume overload, lower extremity edema and atrial fibrillation with rapid rates. His initial cardioversion attempt failed. He was diuresed with IV Lasix and was started on amiodarone and had successful cardioversion on 08/28/16. Had AF ablation 11/28/16 and improvement in his LVEF on his last echo to 45%.  Today, denies symptoms of palpitations, chest pain, shortness of breath, orthopnea, PND, lower extremity edema, claudication, dizziness, presyncope, syncope, bleeding, or neurologic sequela. The patient is tolerating medications without difficulties.  He has been feeling well and is not noted any further episodes of atrial fibrillation.  He has not had palpitations, weakness, fatigue, or shortness of breath.    Past Medical History:  Diagnosis Date  . Anticoagulant long-term use   . Arthritis    "probably in my thumbs" (06/26/2012)  . Bifascicular block   . Chronic combined systolic and diastolic CHF (congestive heart failure) ([place])    a. 01/2016 Echo: EF 45-50%, gr2 DD, inflat AK (poor acoustic windows);  b. 07/2016 Echo: EF 25-30%, mild LVH, PASP 18mmHg (pt in Afib).  . Complication of anesthesia    "wake up w/a start; hallucinations" (06/26/2012)  . Critical Aortic Stenosis    a. 03/2003 s/p SJM #25 mech prosthetic AoV;  b. 07/2016 Echo: EF 25-30% (in setting of AF), AoV area 1.72 cm^2 (VTI), 1.75 cm^2 (Vmean).  . Diverticulosis   . Gouty arthritis    "have had it in both feet, ankles, right knee" (06/26/2012)  . History of diverticulitis of colon   . History of kidney stones   . History of pancreatitis   . Hypertension   . Incomplete RBBB   . PAF (paroxysmal atrial fibrillation) (HCC)    a. CHA2DS2VASc = 4-->chronic coumadin in setting of mech AoV;  b. 07/2016 Recurrent AF RVR.   Past Surgical History:  Procedure Laterality Date  . AORTIC VALVE REPLACEMENT  04-14-2003  DR Servando Snare   #69mm ST JUDE MECHNICAL PROSTHESIS  . CATARACT EXTRACTION W/ INTRAOCULAR LENS  IMPLANT, BILATERAL  RIGHT 2010/  LEFT DEC 2013  . HEMICOLECTOMY  10/06/2003   Laparoscopic-assisted left hemicolectomy for diverticulitis  . LAPAROSCOPIC CHOLECYSTECTOMY  07-23-2006  . PROSTATE BIOPSY  10/09/2012   gleason 3+4=7  . TRANSTHORACIC ECHOCARDIOGRAM  04-28-2008   MILD LVH / NORMAL LSF/ NORMAL AORTIC VALVE MECHANICAL PROSTHESIS FUNCTION/ MILD LAE/ EF 55-60%     Current Outpatient Medications  Medication Sig Dispense Refill  . allopurinol (ZYLOPRIM) 300 MG tablet Take 300 mg by mouth daily.     Marland Kitchen aspirin 81 MG chewable tablet Chew 1 tablet (81 mg total) by mouth daily. 30 tablet 6  . colchicine 0.6 MG tablet Take 0.6 mg by mouth daily as needed (for gout).     Marland Kitchen  docusate sodium (COLACE) 100 MG capsule Take 100 mg by mouth daily.    . folic acid (FOLVITE) 1 MG tablet Take 1 tablet (1 mg total) by mouth daily. 30 tablet 11  . losartan (COZAAR) 25 MG tablet Take 0.5 tablets (12.5 mg total) by mouth at bedtime. 30 tablet 11  . metoprolol succinate (TOPROL-XL) 25 MG 24 hr tablet Take 12.5 mg by mouth daily.    . Psyllium (METAMUCIL PO) Take 2 capsules by mouth 2 (two) times daily.     Marland Kitchen spironolactone (ALDACTONE) 25  MG tablet Take 0.5 tablets (12.5 mg total) by mouth daily after supper. Take 1/2 tab once daily    . tamsulosin (FLOMAX) 0.4 MG CAPS capsule Take 0.4 mg by mouth every other day.     . tolterodine (DETROL LA) 4 MG 24 hr capsule Take 4 mg by mouth daily.   0  . torsemide (DEMADEX) 20 MG tablet Take 1 tablet by mouth daily as needed.  3  . warfarin (COUMADIN) 5 MG tablet Take 5 mg by mouth as directed.     No current facility-administered medications for this visit.     Allergies:   Celecoxib   Social History:  The patient  reports that  has never smoked. he has never used smokeless tobacco. He reports that he drinks about 1.8 oz of alcohol per week. He reports that he does not use drugs.   Family History:  The patient's family history includes Diabetes in his mother; Heart failure in his mother; Prostate cancer in his father.   ROS:  Please see the history of present illness.   Otherwise, review of systems is positive for none.   All other systems are reviewed and negative.   PHYSICAL EXAM: VS:  BP 107/73   Pulse 83   Ht 5\' 10"  (1.778 m)   Wt 186 lb (84.4 kg)   SpO2 98%   BMI 26.69 kg/m  , BMI Body mass index is 26.69 kg/m. GEN: Well nourished, well developed, in no acute distress  HEENT: normal  Neck: no JVD, carotid bruits, or masses Cardiac: RRR; no murmurs, rubs, or gallops,no edema  Respiratory:  clear to auscultation bilaterally, normal work of breathing GI: soft, nontender, nondistended, + BS MS: no deformity or atrophy  Skin: warm and dry Neuro:  Strength and sensation are intact Psych: euthymic mood, full affect  EKG:  EKG is not ordered today. Personal review of the ekg ordered 03/13/17 shows sinus rhythm, RBBB, IMI (old)   Recent Labs: 08/18/2016: Magnesium 2.2 09/04/2016: B Natriuretic Peptide 325.9 12/05/2016: TSH 3.210 03/21/2017: ALT(SGPT) 38; BUN, Bld 17; Creat 1.1; HGB 13.2; Platelets 219; Potassium 4.3; Sodium 143    Lipid Panel  No results found for: CHOL,  TRIG, HDL, CHOLHDL, VLDL, LDLCALC, LDLDIRECT   Wt Readings from Last 3 Encounters:  05/30/17 186 lb (84.4 kg)  03/29/17 185 lb 12.8 oz (84.3 kg)  03/21/17 182 lb (82.6 kg)      Other studies Reviewed: Additional studies/ records that were reviewed today include: TTE 12/05/16 Review of the above records today demonstrates:  - Left ventricle: The cavity size was normal. Wall thickness was   increased in a pattern of mild LVH. The estimated ejection   fraction was 45%. Diffuse hypokinesis. Doppler parameters are   consistent with abnormal left ventricular relaxation (grade 1   diastolic dysfunction). - Aortic valve: There is a Probation officer aortic valve. No   significant stenosis. There was trivial regurgitation. Mean  gradient (S): 12 mm Hg. - Aorta: Dilated ascending aorta. Ascending aortic diameter: 44 mm   (S). - Mitral valve: Mildly calcified annulus. There was trivial   regurgitation. - Left atrium: The atrium was mildly dilated. - Right ventricle: The cavity size was normal. Systolic function   was mildly reduced. - Tricuspid valve: Peak RV-RA gradient (S): 21 mm Hg. - Pulmonary arteries: PA peak pressure: 24 mm Hg (S). - Inferior vena cava: The vessel was normal in size. The   respirophasic diameter changes were in the normal range (>= 50%),   consistent with normal central venous pressure.  ASSESSMENT AND PLAN:  1.  Atrial fibrillation: For anti-coagulation.  Is been taken off of his amiodarone.  Had A. fib ablation on 11/28/16.  No further episodes of atrial fibrillation.  No current changes.  This patients CHA2DS2-VASc Score and unadjusted Ischemic Stroke Rate (% per year) is equal to 4.8 % stroke rate/year from a score of 4  Above score calculated as 1 point each if present [CHF, HTN, DM, Vascular=MI/PAD/Aortic Plaque, Age if 65-74, or Male] Above score calculated as 2 points each if present [Age > 75, or Stroke/TIA/TE]   2. Chronic systolic heart failure:  Action fraction has improved to 45%.  Care per heart failure.  3. Aortic stenosis status post St. Jude mechanical valve: Valve functioning appropriately.  No changes.    Current medicines are reviewed at length with the patient today.   The patient does not have concerns regarding his medicines.  The following changes were made today: None  Labs/ tests ordered today include:  No orders of the defined types were placed in this encounter.    Disposition:   FU with Marsena Taff 6 months  Signed, Amador Braddy Meredith Leeds, MD  05/30/2017 2:34 PM     [place] [phone] [phone] (office) [phone] (fax)

## 2017-05-30 NOTE — Patient Instructions (Signed)
Medication Instructions:  Your physician recommends that you continue on your current medications as directed. Please refer to the Current Medication list given to you today.  Labwork: None ordered  Testing/Procedures: None ordered  Follow-Up: Your physician wants you to follow-up in: 6 months with Dr. Camnitz.  You will receive a reminder letter in the mail two months in advance. If you don't receive a letter, please call our office to schedule the follow-up appointment.  -- If you need a refill on your cardiac medications before your next appointment, please call your pharmacy. --  Thank you for choosing [facility]!!   Ladarius Seubert, RN [phone]  Any Other Special Instructions Will Be Listed Below (If Applicable).        

## 2017-06-07 DIAGNOSIS — Z7901 Long term (current) use of anticoagulants: Secondary | ICD-10-CM | POA: Diagnosis not present

## 2017-06-07 DIAGNOSIS — Z952 Presence of prosthetic heart valve: Secondary | ICD-10-CM | POA: Diagnosis not present

## 2017-06-19 ENCOUNTER — Encounter: Payer: Self-pay | Admitting: Family

## 2017-06-19 ENCOUNTER — Ambulatory Visit: Payer: Medicare Other | Admitting: Family

## 2017-06-19 ENCOUNTER — Other Ambulatory Visit: Payer: Self-pay

## 2017-06-19 ENCOUNTER — Other Ambulatory Visit (HOSPITAL_BASED_OUTPATIENT_CLINIC_OR_DEPARTMENT_OTHER): Payer: Medicare Other

## 2017-06-19 VITALS — BP 118/72 | HR 80 | Temp 98.1°F | Resp 18 | Wt 190.0 lb

## 2017-06-19 DIAGNOSIS — D500 Iron deficiency anemia secondary to blood loss (chronic): Secondary | ICD-10-CM

## 2017-06-19 DIAGNOSIS — D509 Iron deficiency anemia, unspecified: Secondary | ICD-10-CM

## 2017-06-19 LAB — IRON AND TIBC
%SAT: 41 % (ref 20–55)
Iron: 99 ug/dL (ref 42–163)
TIBC: 243 ug/dL (ref 202–409)
UIBC: 143 ug/dL (ref 117–376)

## 2017-06-19 LAB — COMPREHENSIVE METABOLIC PANEL
ALBUMIN: 3.9 g/dL (ref 3.5–5.0)
ALT: 53 U/L (ref 0–55)
AST: 49 U/L — AB (ref 5–34)
Alkaline Phosphatase: 115 U/L (ref 40–150)
Anion Gap: 10 mEq/L (ref 3–11)
BUN: 18.9 mg/dL (ref 7.0–26.0)
CHLORIDE: 105 meq/L (ref 98–109)
CO2: 28 mEq/L (ref 22–29)
Calcium: 9.4 mg/dL (ref 8.4–10.4)
Creatinine: 1 mg/dL (ref 0.7–1.3)
EGFR: >60 mL/min/{1.73_m2} (ref >60)
GLUCOSE: 114 mg/dL (ref 70–140)
POTASSIUM: 4.9 meq/L (ref 3.5–5.1)
SODIUM: 142 meq/L (ref 136–145)
Total Bilirubin: 1.52 mg/dL — ABNORMAL HIGH (ref 0.20–1.20)
Total Protein: 6.6 g/dL (ref 6.4–8.3)

## 2017-06-19 LAB — CBC WITH DIFFERENTIAL (CANCER CENTER ONLY)
BASO#: 0.1 10*3/uL (ref 0.0–0.2)
BASO%: 0.9 % (ref 0.0–2.0)
EOS%: 4 % (ref 0.0–7.0)
Eosinophils Absolute: 0.4 10*3/uL (ref 0.0–0.5)
HCT: 40 % (ref 38.7–49.9)
HEMOGLOBIN: 13.5 g/dL (ref 13.0–17.1)
LYMPH#: 1.8 10*3/uL (ref 0.9–3.3)
LYMPH%: 19.2 % (ref 14.0–48.0)
MCH: 20.1 pg — ABNORMAL LOW (ref 28.0–33.4)
MCHC: 33.8 g/dL (ref 32.0–35.9)
MCV: 60 fL — ABNORMAL LOW (ref 82–98)
MONO#: 0.7 10*3/uL (ref 0.1–0.9)
MONO%: 7.7 % (ref 0.0–13.0)
NEUT%: 68.2 % (ref 40.0–80.0)
NEUTROS ABS: 6.3 10*3/uL (ref 1.5–6.5)
Platelets: 187 10*3/uL (ref 145–400)
RBC: 6.7 10*6/uL — ABNORMAL HIGH (ref 4.20–5.70)
RDW: 18.5 % — AB (ref 11.1–15.7)
WBC: 9.2 10*3/uL (ref 4.0–10.0)

## 2017-06-19 LAB — FERRITIN: Ferritin: 356 ng/ml — ABNORMAL HIGH (ref 22–316)

## 2017-06-19 NOTE — Progress Notes (Signed)
Hematology and Oncology Follow Up Visit  Adrian Lambert [id number] Sep 19, 1940 76 y.o. 06/19/2017   Principle Diagnosis:  Iron deficiency anemia   Current Therapy:   Observation   Interim History:  [name] is here today for follow-up. He is doing well and has no complaints at this time. He is taking his folic acid daily as prescribed.  No episode of bleeding. He does bruise easily on Coumadin. No lymphadenopathy found on exam.  He has had no fever, chills, n/v, cough, rash, dizziness, SOB, chest pain, palpitations, abdominal pain or change sin bowel or bladder habits.  No swelling, tenderness, numbness or tingling in his extremities. No c/o pain.  He has maintained a good appetite and is staying well hydrated. His weight is stable.   ECOG Performance Status: 0 - Asymptomatic  Medications:  Allergies as of 06/19/2017      Reactions   Celecoxib Rash, Other (See Comments)   Celebrex      Medication List        Accurate as of 06/19/17 11:11 AM. Always use your most recent med list.          allopurinol 300 MG tablet Commonly known as:  ZYLOPRIM Take 300 mg by mouth daily.   aspirin 81 MG chewable tablet Chew 1 tablet (81 mg total) by mouth daily.   colchicine 0.6 MG tablet Take 0.6 mg by mouth daily as needed (for gout).   docusate sodium 100 MG capsule Commonly known as:  COLACE Take 100 mg by mouth daily.   folic acid 1 MG tablet Commonly known as:  FOLVITE Take 1 tablet (1 mg total) by mouth daily.   losartan 25 MG tablet Commonly known as:  COZAAR Take 0.5 tablets (12.5 mg total) by mouth at bedtime.   METAMUCIL PO Take 2 capsules by mouth 2 (two) times daily.   metoprolol succinate 25 MG 24 hr tablet Commonly known as:  TOPROL-XL Take 12.5 mg by mouth daily.   spironolactone 25 MG tablet Commonly known as:  ALDACTONE Take 0.5 tablets (12.5 mg total) by mouth daily after supper. Take 1/2 tab once daily   tamsulosin 0.4 MG Caps  capsule Commonly known as:  FLOMAX Take 0.4 mg by mouth every other day.   tolterodine 4 MG 24 hr capsule Commonly known as:  DETROL LA Take 4 mg by mouth daily.   torsemide 20 MG tablet Commonly known as:  DEMADEX Take 1 tablet by mouth daily as needed.   warfarin 5 MG tablet Commonly known as:  COUMADIN Take 5 mg by mouth as directed.       Allergies:  Allergies  Allergen Reactions  . Celecoxib Rash and Other (See Comments)    Celebrex    Past Medical History, Surgical history, Social history, and Family History were reviewed and updated.  Review of Systems: All other 10 point review of systems is negative.   Physical Exam:  weight is 190 lb (86.2 kg). His oral temperature is 98.1 F (36.7 C). His blood pressure is 118/72 and his pulse is 80. His respiration is 18 and oxygen saturation is 100%.   Wt Readings from Last 3 Encounters:  06/19/17 190 lb (86.2 kg)  05/30/17 186 lb (84.4 kg)  03/29/17 185 lb 12.8 oz (84.3 kg)    Ocular: Sclerae unicteric, pupils equal, round and reactive to light Ear-nose-throat: Oropharynx clear, dentition fair Lymphatic: No cervical, supraclavicular or axillary adenopathy Lungs no rales or rhonchi, good excursion bilaterally Heart regular rate and  rhythm, no murmur appreciated Abd soft, nontender, positive bowel sounds, no liver or spleen tip palpated on exam, no fluid wave  MSK no focal spinal tenderness, no joint edema Neuro: non-focal, well-oriented, appropriate affect Breasts: Deferred   Lab Results  Component Value Date   WBC 9.2 06/19/2017   HGB 13.5 06/19/2017   HCT 40.0 06/19/2017   MCV 60 (L) 06/19/2017   PLT 187 06/19/2017   Lab Results  Component Value Date   FERRITIN 437 (H) 03/21/2017   IRON 99 03/21/2017   TIBC 269 03/21/2017   UIBC 170 03/21/2017   IRONPCTSAT 37 03/21/2017   Lab Results  Component Value Date   RBC 6.70 (H) 06/19/2017   No results found for: KPAFRELGTCHN, LAMBDASER, KAPLAMBRATIO No  results found for: IGGSERUM, IGA, IGMSERUM No results found for: Odetta Pink, SPEI   Chemistry      Component Value Date/Time   NA 143 03/21/2017 0928   K 4.3 03/21/2017 0928   CL 104 03/21/2017 0928   CO2 30 03/21/2017 0928   BUN 17 03/21/2017 0928   CREATININE 1.1 03/21/2017 0928      Component Value Date/Time   CALCIUM 9.1 03/21/2017 0928   ALKPHOS 115 (H) 03/21/2017 0928   AST 46 (H) 03/21/2017 0928   ALT 38 03/21/2017 0928   BILITOT 2.00 (H) 03/21/2017 0928      Impression and Plan: [name] is a very pleasant 76 yo caucasian gentleman with history of anemia. His Hgb is stable at 13.5 with an MCV of 60. He is asymptomatic and has no complaints.  He will continue on his same regimen with folic acid.  We will see what his iron studies show and bring him back in next week for infusion if needed.  We will plan to see him back in another 4 months for follow-up and repeat labs.  He will contact our office with any questions or concerns. We can certainly see him sooner if need be.   Eliezer Bottom, NP 11/27/201811:11 AM

## 2017-07-10 ENCOUNTER — Ambulatory Visit (HOSPITAL_COMMUNITY)
Admission: RE | Admit: 2017-07-10 | Discharge: 2017-07-10 | Disposition: A | Discharge status: Home / Self Care | Payer: Medicare Other | Source: Ambulatory Visit | Attending: Cardiology | Admitting: Cardiology

## 2017-07-10 VITALS — BP 128/76 | HR 85 | Wt 190.5 lb

## 2017-07-10 DIAGNOSIS — N183 Chronic kidney disease, stage 3 (moderate): Secondary | ICD-10-CM | POA: Insufficient documentation

## 2017-07-10 DIAGNOSIS — E669 Obesity, unspecified: Secondary | ICD-10-CM | POA: Diagnosis not present

## 2017-07-10 DIAGNOSIS — Z952 Presence of prosthetic heart valve: Secondary | ICD-10-CM | POA: Diagnosis not present

## 2017-07-10 DIAGNOSIS — Z79899 Other long term (current) drug therapy: Secondary | ICD-10-CM | POA: Insufficient documentation

## 2017-07-10 DIAGNOSIS — R55 Syncope and collapse: Secondary | ICD-10-CM | POA: Diagnosis not present

## 2017-07-10 DIAGNOSIS — Z6827 Body mass index (BMI) 27.0-27.9, adult: Secondary | ICD-10-CM | POA: Insufficient documentation

## 2017-07-10 DIAGNOSIS — Z7902 Long term (current) use of antithrombotics/antiplatelets: Secondary | ICD-10-CM | POA: Diagnosis not present

## 2017-07-10 DIAGNOSIS — I712 Thoracic aortic aneurysm, without rupture, unspecified: Secondary | ICD-10-CM

## 2017-07-10 DIAGNOSIS — Z8042 Family history of malignant neoplasm of prostate: Secondary | ICD-10-CM | POA: Diagnosis not present

## 2017-07-10 DIAGNOSIS — Z7982 Long term (current) use of aspirin: Secondary | ICD-10-CM | POA: Diagnosis not present

## 2017-07-10 DIAGNOSIS — Z9889 Other specified postprocedural states: Secondary | ICD-10-CM | POA: Diagnosis not present

## 2017-07-10 DIAGNOSIS — I729 Aneurysm of unspecified site: Secondary | ICD-10-CM | POA: Diagnosis not present

## 2017-07-10 DIAGNOSIS — I350 Nonrheumatic aortic (valve) stenosis: Secondary | ICD-10-CM | POA: Insufficient documentation

## 2017-07-10 DIAGNOSIS — Z8546 Personal history of malignant neoplasm of prostate: Secondary | ICD-10-CM | POA: Insufficient documentation

## 2017-07-10 DIAGNOSIS — I429 Cardiomyopathy, unspecified: Secondary | ICD-10-CM | POA: Insufficient documentation

## 2017-07-10 DIAGNOSIS — I130 Hypertensive heart and chronic kidney disease with heart failure and stage 1 through stage 4 chronic kidney disease, or unspecified chronic kidney disease: Secondary | ICD-10-CM | POA: Insufficient documentation

## 2017-07-10 DIAGNOSIS — I5022 Chronic systolic (congestive) heart failure: Secondary | ICD-10-CM | POA: Diagnosis present

## 2017-07-10 DIAGNOSIS — I480 Paroxysmal atrial fibrillation: Secondary | ICD-10-CM | POA: Diagnosis not present

## 2017-07-10 DIAGNOSIS — Z87442 Personal history of urinary calculi: Secondary | ICD-10-CM | POA: Diagnosis not present

## 2017-07-10 DIAGNOSIS — I5042 Chronic combined systolic (congestive) and diastolic (congestive) heart failure: Secondary | ICD-10-CM

## 2017-07-10 DIAGNOSIS — Z833 Family history of diabetes mellitus: Secondary | ICD-10-CM | POA: Insufficient documentation

## 2017-07-10 DIAGNOSIS — I4891 Unspecified atrial fibrillation: Secondary | ICD-10-CM | POA: Diagnosis not present

## 2017-07-10 DIAGNOSIS — Z8249 Family history of ischemic heart disease and other diseases of the circulatory system: Secondary | ICD-10-CM | POA: Diagnosis not present

## 2017-07-10 MED ORDER — METOPROLOL SUCCINATE ER 25 MG PO TB24: 12.5000 mg | ORAL_TABLET | Freq: Two times a day (BID) | ORAL | 3 refills | Status: DC

## 2017-07-10 NOTE — Progress Notes (Signed)
PCP: Dr Rex Kras - Also manages INR Cardiology: Dr. Martinique  HF Cardiology: Dr Aundra Dubin   HPI: Adrian Lambert is a 76 y.o. male with a history of severe AS s/p St Jude mechanical AVR in 03/2003 on chronic coumadin, HTN, obesity, and mild LV dysfxn EF 40-45% on 01/2016 echo. Prior to admit in 1/18, he had a cough and dyspnea that was thought to be bronchitis so he was treated with abx and oral steroids. He was seen in cardiology office in 1/18 and was noted to be in rapid atrial fibrillation with significant volume overload.   Admitted 1/23 through 08/29/16 with marked volume overload, lower extremity edema, and atrial fibrillation with RVR.  Initial cardioversion failed and he was difficult to diurese. He was placed on milrinone for low output and diuresed with IV lasix with good response.  Weight came down considerably. Amiodarone was begun. Once fully diuresed, he had successful DC-CV on 08/28/16. Discharged on torsemide 60 mg daily. Discharge weight 212 pounds. Echo and TEE in 1/18 had shown fall in EF to around 25%.   He continued to lose weight as an outpatient.  He had a syncopal episode where he stood up from sitting and got lightheaded with transient loss of consciousness.  He had been having orthostatic symptoms.  We saw him in the office and cut back on torsemide from 60 mg daily down eventually to 20 mg daily.  Creatinine up some to 1.7 at that time.    At a prior appt, he was back in atrial fibrillation.  He was set up for DCCV in 3/18, but was in NSR when he arrived to short stay. He had atrial fibrillation ablation with Dr. Curt Bears in 5/18.   Echo was done in 5/18, EF up to 45% with mild diffuse hypokinesis, normal mechanical aortic valve, mildly decreased RV systolic function.    He returns for followup of CHF.  He continues to feel well.  He is in NSR today. No significant exertional dyspnea.  No lightheadedness on current med regimen. No chest pain. No BRBPR/melena.  He takes torsemide 1-2 times a  month.  Weight is up but he feels like it is due to eating more around Thanksgiving.    ECG (personally reviewed): NSR, LAFB, iRBBB, poor RWP.   Labs (09/04/2016):  Hgb 15.9, K 5.0, Creatinine 1.77, digoxin 0.7, BNP 326  Labs (3/18): digoxin 0.8, K 4.8, creatinine 1.44, TSH normal, AST 54, ALT 49 Labs (4/18): K 4.3, creatinine 1.3 Labs (7/18): K 3.8, creatinine 0.9, hgb 11.9 Labs (11/18): K 4.9, creatinine 1.0, hgb 13.5  PMH: 1. Aortic stenosis: # 25 St Jude mechanical aortic valve in 9/14.   2. Chronic systolic CHF: Echo in 6/62 with EF 45-50%.  Admitted in 1/18 with afib with RVR, ?tachycardia-mediated cardiomyopathy.  - Echo (1/18) with EF 25-30%, stable mechanical aortic valve.  - TEE (1/18) with EF 20-25%, stable mechanical aortic valve, moderate Adrian, small PFO.  - Admission 1/18 with acute on chronic systolic CHF, required milrinone, extensively diuresed.  - Echo (5/18) with EF 45%, diffuse hypokinesis, normal RV size with mildly decreased systolic function, mechanical aortic valve with mean gradient 14 mmHg, ascending aorta 4.4 cm.  3. Atrial fibrillation: First noted in 9/47, uncertain how long it had been present (with RVR).  08/28/2016 Successful DC-CV --> NSR.  Back in atrial fibrillation at 09/18/16 office visit.  Set up for DCCV in 3/18 but back in NSR.   - Atrial fibrillation ablation in 5/18. Now off  amiodarone.  4. 09/04/2016 Syncope--> over-diuresis most likely.  5. HTN 6. Ascending aortic aneurysm: 4.4 cm on 7/17 echo, 4.4 cm on 5/18 echo.  7. Prostate cancer: Treated with radioactive seed implantation.  8. Nephrolithiasis.  9. PFTs (4/18): Mild restriction.   ROS: All systems negative except as listed in HPI, PMH and Problem List.  SH:  Social History   Socioeconomic History  . Marital status: Married    Spouse name: Not on file  . Number of children: 4  . Years of education: Not on file  . Highest education level: Not on file  Social Needs  . Financial resource  strain: Not on file  . Food insecurity - worry: Not on file  . Food insecurity - inability: Not on file  . Transportation needs - medical: Not on file  . Transportation needs - non-medical: Not on file  Occupational History  . 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 Smoking status: Never Smoker  . Smokeless tobacco: Never Used  Substance and Sexual Activity  . Alcohol use: Yes    Alcohol/week: 1.8 oz    Types: 3 Cans of beer per week    Comment:  "may have 2-3 beers on the weekend"  . Drug use: No  . Sexual activity: Not on file  Other Topics Concern  . Not on file  Social History Narrative  . Not on file    FH:  Family History  Problem Relation Age of Onset  . Heart failure Mother   . Diabetes Mother   . Prostate cancer Father     Current Outpatient Medications  Medication Sig Dispense Refill  . allopurinol (ZYLOPRIM) 300 MG tablet Take 300 mg by mouth daily.     Marland Kitchen aspirin 81 MG chewable tablet Chew 1 tablet (81 mg total) by mouth daily. 30 tablet 6  . colchicine 0.6 MG tablet Take 0.6 mg by mouth daily as needed (for gout).     Marland Kitchen docusate sodium (COLACE) 100 MG capsule Take 100 mg by mouth daily.    . folic acid (FOLVITE) 1 MG tablet Take 1 tablet (1 mg total) by mouth daily. 30 tablet 11  . losartan (COZAAR) 25 MG tablet Take 0.5 tablets (12.5 mg total) by mouth at bedtime. 30 tablet 11  . metoprolol succinate (TOPROL-XL) 25 MG 24 hr tablet Take 0.5 tablets (12.5 mg total) by mouth 2 (two) times daily. 15 tablet 3  . Psyllium (METAMUCIL PO) Take 2 capsules by mouth 2 (two) times daily.     Marland Kitchen spironolactone (ALDACTONE) 25 MG tablet Take 0.5 tablets (12.5 mg total) by mouth daily after supper. Take 1/2 tab once daily    . tamsulosin (FLOMAX) 0.4 MG CAPS capsule Take 0.4 mg by mouth every other day.     . tolterodine (DETROL LA) 4 MG 24 hr capsule Take 4 mg by mouth daily.   0  . torsemide (DEMADEX) 20 MG tablet Take 1 tablet by mouth daily as needed.   3  . warfarin (COUMADIN) 5 MG tablet Take 5 mg by mouth as directed.     No current facility-administered medications for this encounter.     Vitals:   07/10/17 0948  BP: 128/76  Pulse: 85  SpO2: 97%  Weight: 190 lb 8 oz (86.4 kg)   Filed Weights   07/10/17 0948  Weight: 190 lb 8 oz (86.4 kg)   PHYSICAL EXAM: General: NAD Neck: No JVD, no thyromegaly or thyroid nodule.  Lungs: Clear to auscultation bilaterally with normal respiratory effort. CV: Nondisplaced PMI.  Heart regular S1/S2 with mechanical S2, no S3/S4, 1/6 SEM RUSB.  No peripheral edema.  No carotid bruit.  Normal pedal pulses.  Abdomen: Soft, nontender, no hepatosplenomegaly, no distention.  Skin: Intact without lesions or rashes.  Neurologic: Alert and oriented x 3.  Psych: Normal affect. Extremities: No clubbing or cyanosis.  HEENT: Normal.   ASSESSMENT & PLAN: 1. Chronic systolic CHF: EF on TEE in 1/18 20-25%, mechanical aortic valve ok.  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  He required milrinone to assist diuresis during 1/18-2/18 admission.  Now that he is back in NSR s/p atrial fibrillation ablation, echo in 5/18 showed EF back up to 45%. Currently, NYHA class II symptoms and does not appear volume overloaded.  Medication titration has been limited by lightheadedness, he is tolerating current regimen.   - He may continue prn use of torsemide.   - Continue spironolactone 12.5 daily and losartan 12.5 mg qhs.    - As EF is still mildly low, I will have him increase Toprol XL to 12.5 mg bid.  - Given no ACS/chest pain and improvement in EF back to his baseline (45%), I think we can defer coronary angiography.  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    - Repeat echo in 5/19. 2. Mechanical aortic valve: Continue warfarin and ASA 81. Valve looked ok on 5/18 echo. 3. Atrial fibrillation: Admitted 1/18 with atrial  fibrillation with RVR and CHF.  EF down to 20-25%.  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  Need to keep him in NSR. DCCV during 2/18 admission and now s/p atrial fibrillation ablation.  He is in NSR today.  -  He is now off amiodarone.  4. CKD: Stage III.   5. Syncope: Suspect prior episode of syncope was due to over-diuresis (orthostatic).  6. Ascending aortic aneurysm: 4.4 cm on 5/18 echo.  Will get MRA chest in 5/19.   Followup in 5/19 with echo.    Adrian Lambert 07/10/2017

## 2017-07-10 NOTE — Patient Instructions (Signed)
Increase metoprolol 12.5 mg 91/2 tab), twice a day  You have been referred to have and MRA done in May, 2019  Your physician has requested that you have an echocardiogram. Echocardiography is a painless test that uses sound waves to create images of your heart. It provides your doctor with information about the size and shape of your heart and how well your heart's chambers and valves are working. This procedure takes approximately one hour. There are no restrictions for this procedure.  Your physician recommends that you schedule a follow-up appointment in: 11/2017 with Dr. Aundra Dubin   an a echocardiogram

## 2017-07-19 DIAGNOSIS — Z7901 Long term (current) use of anticoagulants: Secondary | ICD-10-CM | POA: Diagnosis not present

## 2017-07-19 DIAGNOSIS — Z952 Presence of prosthetic heart valve: Secondary | ICD-10-CM | POA: Diagnosis not present

## 2017-08-01 ENCOUNTER — Other Ambulatory Visit (HOSPITAL_COMMUNITY): Payer: Self-pay | Admitting: Adult Health

## 2017-08-30 DIAGNOSIS — Z7901 Long term (current) use of anticoagulants: Secondary | ICD-10-CM | POA: Diagnosis not present

## 2017-08-30 DIAGNOSIS — Z952 Presence of prosthetic heart valve: Secondary | ICD-10-CM | POA: Diagnosis not present

## 2017-09-07 ENCOUNTER — Other Ambulatory Visit (HOSPITAL_COMMUNITY): Payer: Self-pay | Admitting: *Deleted

## 2017-09-07 MED ORDER — LOSARTAN POTASSIUM 25 MG PO TABS: 12.5000 mg | ORAL_TABLET | Freq: Every day | ORAL | 3 refills | Status: DC

## 2017-10-03 DIAGNOSIS — Z7901 Long term (current) use of anticoagulants: Secondary | ICD-10-CM | POA: Diagnosis not present

## 2017-10-03 DIAGNOSIS — Z952 Presence of prosthetic heart valve: Secondary | ICD-10-CM | POA: Diagnosis not present

## 2017-10-15 NOTE — Progress Notes (Signed)
Adrian Lambert Date of Birth: 06/16/1941 Medical Record #[id number]  History of Present Illness: Adrian Lambert is seen for followup AVR and CHF. He has a history of severe aortic stenosis and is status post aortic valve replacement with a #25 mm St. Jude mechanical prosthesis September 2004.   He is on chronic Coumadin therapy. He has a history of obesity and HTN. Echo in 2017 showed EF 40-45%.   He was admitted 1/23 through 08/29/16 with marked volume overload, lower extremity edema, and atrial fibrillation with RVR.  Initial cardioversion failed and he was difficult to diurese. He was placed on milrinone for low output and diuresed with IV lasix with good response.  Weight came down considerably. Amiodarone was begun. Once fully diuresed, he had successful DCCV on 08/28/16.  On post hospital appointment, he was back in atrial fibrillation.  He was set up for DCCV in 3/18, but was in NSR when he arrived to short stay. He was later seen by Dr. Curt Bears and underwent Afib ablation on 11/28/16. He has maintained NSR on follow up. He is also followed in the CHF clinic. Repeat Echo in May showed improvement in EF to 45%. His amiodarone was discontinued on 12/26/16. His digoxin was discontinued. Titration of his medications has been limited by orthostatic symptoms.   On follow up today he is doing very well. He is walking every day for one mile.  No dyspnea, chest pain, edema, dizziness. Monitors weight daily and it has been stable. No palpitations. Has not had to use PRN Torsemide.  Current Outpatient Medications on File Prior to Visit  Medication Sig Dispense Refill  . allopurinol (ZYLOPRIM) 300 MG tablet Take 300 mg by mouth daily.     Marland Kitchen aspirin 81 MG chewable tablet Chew 1 tablet (81 mg total) by mouth daily. 30 tablet 6  . colchicine 0.6 MG tablet Take 0.6 mg by mouth daily as needed (for gout).     Marland Kitchen docusate sodium (COLACE) 100 MG capsule Take 100 mg by mouth daily.    . folic acid (FOLVITE) 1 MG tablet  Take 1 tablet (1 mg total) by mouth daily. 30 tablet 11  . losartan (COZAAR) 25 MG tablet Take 0.5 tablets (12.5 mg total) by mouth at bedtime. 45 tablet 3  . metoprolol succinate (TOPROL-XL) 25 MG 24 hr tablet Take 0.5 tablets (12.5 mg total) by mouth 2 (two) times daily. 15 tablet 3  . Psyllium (METAMUCIL PO) Take 2 capsules by mouth 2 (two) times daily.     Marland Kitchen spironolactone (ALDACTONE) 25 MG tablet TAKE 1/2 TABLET(12.5 MG) BY MOUTH DAILY 30 tablet 3  . tamsulosin (FLOMAX) 0.4 MG CAPS capsule Take 0.4 mg by mouth every other day.     . tolterodine (DETROL LA) 4 MG 24 hr capsule Take 4 mg by mouth daily.   0  . torsemide (DEMADEX) 20 MG tablet Take 1 tablet by mouth daily as needed.  3  . warfarin (COUMADIN) 5 MG tablet Take 5 mg by mouth as directed. 1/2 Tablet(2.5mg ) Monday, Wednesday, Friday. 1 tablet( 5mg )Tuesday, Thursday, Saturday, Sunday.     No current facility-administered medications on file prior to visit.     Allergies  Allergen Reactions  . Celecoxib Rash and Other (See Comments)    Celebrex    Past Medical History:  Diagnosis Date  . Anticoagulant long-term use   . Arthritis    "probably in my thumbs" (06/26/2012)  . Bifascicular block   . Chronic combined systolic and  diastolic CHF (congestive heart failure) ([place])    a. 01/2016 Echo: EF 45-50%, gr2 DD, inflat AK (poor acoustic windows);  b. 07/2016 Echo: EF 25-30%, mild LVH, PASP 49mmHg (pt in Afib).  . Complication of anesthesia    "wake up w/a start; hallucinations" (06/26/2012)  . Critical Aortic Stenosis    a. 03/2003 s/p SJM #25 mech prosthetic AoV;  b. 07/2016 Echo: EF 25-30% (in setting of AF), AoV area 1.72 cm^2 (VTI), 1.75 cm^2 (Vmean).  . Diverticulosis   . Gouty arthritis    "have had it in both feet, ankles, right knee" (06/26/2012)  . History of diverticulitis of colon   . History of kidney stones   . History of pancreatitis   . Hypertension   . Incomplete RBBB   . PAF (paroxysmal atrial fibrillation)  (HCC)    a. CHA2DS2VASc = 4-->chronic coumadin in setting of mech AoV;  b. 07/2016 Recurrent AF RVR.    Past Surgical History:  Procedure Laterality Date  . AORTIC VALVE REPLACEMENT  04-14-2003  DR Servando Snare   #92mm ST JUDE MECHNICAL PROSTHESIS  . ATRIAL FIBRILLATION ABLATION N/A 11/28/2016   Procedure: Atrial Fibrillation Ablation;  Surgeon: Constance Haw, MD;  Location: [place] [facility];  Service: Cardiovascular;  Laterality: N/A;  . CARDIOVERSION N/A 08/18/2016   Procedure: CARDIOVERSION;  Surgeon: Thayer Headings, MD;  Location[facility] ENDOSCOPY;  Service: Cardiovascular;  Laterality: N/A;  . CARDIOVERSION N/A 08/28/2016   Procedure: CARDIOVERSION;  Surgeon: Larey Dresser, MD;  Location: [place];  Service: Cardiovascular;  Laterality: N/A;  . CATARACT EXTRACTION W/ INTRAOCULAR LENS  IMPLANT, BILATERAL  RIGHT 2010/  LEFT DEC 2013  . HEMICOLECTOMY  10/06/2003   Laparoscopic-assisted left hemicolectomy for diverticulitis  . LAPAROSCOPIC CHOLECYSTECTOMY  07-23-2006  . PROSTATE BIOPSY  10/09/2012   gleason 3+4=7  . RADIOACTIVE SEED IMPLANT N/A 01/09/2013   Procedure: RADIOACTIVE SEED IMPLANT;  Surgeon: Franchot Gallo, MD;  Location: [facility];  Service: Urology;  Laterality: N/A;  . TEE WITHOUT CARDIOVERSION N/A 08/18/2016   Procedure: TRANSESOPHAGEAL ECHOCARDIOGRAM (TEE);  Surgeon: Thayer Headings, MD;  Location: [place];  Service: Cardiovascular;  Laterality: N/A;  . TRANSTHORACIC ECHOCARDIOGRAM  04-28-2008   MILD LVH / NORMAL LSF/ NORMAL AORTIC VALVE MECHANICAL PROSTHESIS FUNCTION/ MILD LAE/ EF 55-60%    Social History   Tobacco Use  Smoking Status Never Smoker  Smokeless Tobacco Never Used    Social History   Substance and Sexual Activity  Alcohol Use Yes  . Alcohol/week: 1.8 oz  . Types: 3 Cans of beer per week   Comment:  "may have 2-3 beers on the weekend"    Family History  Problem Relation Age of Onset  . Heart failure Mother   .  Diabetes Mother   . Prostate cancer Father     Review of Systems: As noted in history of present illness.  All other systems were reviewed and are negative.  Physical Exam: BP 110/64   Pulse 73   Ht 5\' 10"  (1.778 m)   Wt 191 lb 6.4 oz (86.8 kg)   BMI 27.46 kg/m  GENERAL:  Well appearing HEENT:  PERRL, EOMI, sclera are clear. Oropharynx is clear. NECK:  No jugular venous distention, carotid upstroke brisk and symmetric, no bruits, no thyromegaly or adenopathy LUNGS:  Clear to auscultation bilaterally CHEST:  Unremarkable HEART:  RRR,  PMI not displaced or sustained,good mechanical S2.  no S3, no S4: no clicks, no rubs, no murmurs ABD:  Soft, nontender. BS +, no masses or bruits. No hepatomegaly, no splenomegaly EXT:  2 + pulses throughout, trace RLE edema, no cyanosis no clubbing SKIN:  Warm and dry.  No rashes NEURO:  Alert and oriented x 3. Cranial nerves II through XII intact. PSYCH:  Cognitively intact    LABORATORY DATA: Lab Results  Component Value Date   WBC 9.1 10/16/2017   HGB 13.5 06/19/2017   HCT 38.0 (L) 10/16/2017   PLT 207 10/16/2017   GLUCOSE 110 10/16/2017   ALT 26 10/16/2017   AST 34 10/16/2017   NA 139 10/16/2017   K 4.7 10/16/2017   CL 106 10/16/2017   CREATININE 0.99 10/16/2017   BUN 17 10/16/2017   CO2 27 10/16/2017   TSH 3.210 12/05/2016   INR 1.87 12/05/2016   Lipids dated 12/06/16: cholesterol 141, triglycerides 186, HDL 30, LDL 74.   Echo 12/05/16: Study Conclusions  - Left ventricle: The cavity size was normal. Wall thickness was   increased in a pattern of mild LVH. The estimated ejection   fraction was 45%. Diffuse hypokinesis. Doppler parameters are   consistent with abnormal left ventricular relaxation (grade 1   diastolic dysfunction). - Aortic valve: There is a Probation officer aortic valve. No   significant stenosis. There was trivial regurgitation. Mean   gradient (S): 12 mm Hg. - Aorta: Dilated ascending aorta. Ascending  aortic diameter: 44 mm   (S). - Mitral valve: Mildly calcified annulus. There was trivial   regurgitation. - Left atrium: The atrium was mildly dilated. - Right ventricle: The cavity size was normal. Systolic function   was mildly reduced. - Tricuspid valve: Peak RV-RA gradient (S): 21 mm Hg. - Pulmonary arteries: PA peak pressure: 24 mm Hg (S). - Inferior vena cava: The vessel was normal in size. The   respirophasic diameter changes were in the normal range (>= 50%),   consistent with normal central venous pressure.  Impressions:  - Normal LV size with mild LV hypertrophy. EF 45%, diffuse   hypokinesis. Normal RV size with mildly decreased systolic   function. Mechanical aortic valve functioning normally.   Assessment / Plan: 1. History of severe AS s/p mechanical AVR in 2004. Normal valve function by exam and Echo.  Continue long term coumadin therapy- INR followed by Dr. Rex Kras. On ASA 81 mg daily. Recommendations for SBE prophylaxis reviewed.   2. . Chronic systolic CHF: EF on TEE in 1/18 20-25%, mechanical aortic valve ok. 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 Since Afib ablation and restoration of NSR EF improved to 45% which was his baseline. Currently, NYHA class I-II symptoms. Volume status is good. Weight is stable.   - Continue torsemide prn now - Continue spironolactone  - Can continue Toprol XL and losartan at current doses.  - plan repeat Echo in May per CHF team  3. Atrial fibrillation: Admitted 1/18 with atrial fibrillation with RVR and CHF. EF down to 20-25%.  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 Need to keep himin NSR. DCCV during 2/18 admission and now s/p atrial fibrillation ablation.  He is maintaining NSR now. 4. CKD: Stage III.  stable.  5. HTN- controlled.  6. Ascending aortic aneurysm: 4.4 cm on 5/18 echo and CT.  F/u with MRI  also in May.  I will follow up in 6 months.

## 2017-10-16 ENCOUNTER — Inpatient Hospital Stay: Payer: Medicare Other

## 2017-10-16 ENCOUNTER — Other Ambulatory Visit: Payer: Self-pay

## 2017-10-16 ENCOUNTER — Encounter: Payer: Self-pay | Admitting: Family

## 2017-10-16 ENCOUNTER — Inpatient Hospital Stay: Payer: Medicare Other | Attending: Family | Admitting: Family

## 2017-10-16 DIAGNOSIS — I4891 Unspecified atrial fibrillation: Secondary | ICD-10-CM | POA: Diagnosis not present

## 2017-10-16 DIAGNOSIS — D500 Iron deficiency anemia secondary to blood loss (chronic): Secondary | ICD-10-CM

## 2017-10-16 DIAGNOSIS — D509 Iron deficiency anemia, unspecified: Secondary | ICD-10-CM | POA: Insufficient documentation

## 2017-10-16 LAB — CBC WITH DIFFERENTIAL (CANCER CENTER ONLY)
BASOS PCT: 1 %
Basophils Absolute: 0.1 10*3/uL (ref 0.0–0.1)
EOS ABS: 0.3 10*3/uL (ref 0.0–0.5)
EOS PCT: 3 %
HCT: 38 % — ABNORMAL LOW (ref 38.7–49.9)
Hemoglobin: 12.7 g/dL — ABNORMAL LOW (ref 13.0–17.1)
Lymphocytes Relative: 20 %
Lymphs Abs: 1.9 10*3/uL (ref 0.9–3.3)
MCH: 20.4 pg — ABNORMAL LOW (ref 28.0–33.4)
MCHC: 33.4 g/dL (ref 32.0–35.9)
MCV: 61.1 fL — AB (ref 82.0–98.0)
MONO ABS: 0.7 10*3/uL (ref 0.1–0.9)
Monocytes Relative: 8 %
Neutro Abs: 6.1 10*3/uL (ref 1.5–6.5)
Neutrophils Relative %: 68 %
PLATELETS: 207 10*3/uL (ref 145–400)
RBC: 6.22 MIL/uL — AB (ref 4.20–5.70)
RDW: 18.6 % — AB (ref 11.1–15.7)
WBC: 9.1 10*3/uL (ref 4.0–10.0)

## 2017-10-16 LAB — IRON AND TIBC
Iron: 67 ug/dL (ref 42–163)
SATURATION RATIOS: 28 % — AB (ref 42–163)
TIBC: 235 ug/dL (ref 202–409)
UIBC: 168 ug/dL

## 2017-10-16 LAB — COMPREHENSIVE METABOLIC PANEL
ALBUMIN: 3.9 g/dL (ref 3.5–5.0)
ALT: 26 U/L (ref 0–55)
AST: 34 U/L (ref 5–34)
Alkaline Phosphatase: 130 U/L (ref 40–150)
Anion gap: 6 (ref 3–11)
BUN: 17 mg/dL (ref 7–26)
CHLORIDE: 106 mmol/L (ref 98–109)
CO2: 27 mmol/L (ref 22–29)
CREATININE: 0.99 mg/dL (ref 0.70–1.30)
Calcium: 9.4 mg/dL (ref 8.4–10.4)
GFR calc Af Amer: >60 mL/min (ref >60)
Glucose, Bld: 110 mg/dL (ref 70–140)
POTASSIUM: 4.7 mmol/L (ref 3.5–5.1)
SODIUM: 139 mmol/L (ref 136–145)
Total Bilirubin: 1.2 mg/dL (ref 0.2–1.2)
Total Protein: 6.6 g/dL (ref 6.4–8.3)

## 2017-10-16 LAB — FERRITIN: FERRITIN: 336 ng/mL — AB (ref 22–316)

## 2017-10-16 NOTE — Progress Notes (Signed)
Hematology and Oncology Follow Up Visit  Adrian Lambert [id number] [birth year] 77 y.o. 10/16/2017   Principle Diagnosis:  Iron deficiency anemia  Current Therapy:   Observation   Interim History:  Adrian Lambert is here today for follow-up. He is doing quite well and has no complaints at this time.  He is staying active walking twice a day in his treadmill, doing yard work and walking his dog.  He has had no issue with infections. No fever, chills, n/v, cough, rash, dizziness, SOB, chest pain, palpitations, abdominal pain or changes in bowel or bladder habits.  He has joint pain in his hands due to arthritis. No swelling, numbness or tingling in his extremities.  He continues to do well on Coumadin for atrial fib. No episodes of bleeding, no bruising or petechiae.  No lymphadenopathy found on exam.  He has maintained a good appetite and is staying well hydrated. His weight is stable.   ECOG Performance Status: 0 - Asymptomatic  Medications:  Allergies as of 10/16/2017      Reactions   Celecoxib Rash, Other (See Comments)   Celebrex      Medication List        Accurate as of 10/16/17  8:46 AM. Always use your most recent med list.          allopurinol 300 MG tablet Commonly known as:  ZYLOPRIM Take 300 mg by mouth daily.   aspirin 81 MG chewable tablet Chew 1 tablet (81 mg total) by mouth daily.   colchicine 0.6 MG tablet Take 0.6 mg by mouth daily as needed (for gout).   docusate sodium 100 MG capsule Commonly known as:  COLACE Take 100 mg by mouth daily.   folic acid 1 MG tablet Commonly known as:  FOLVITE Take 1 tablet (1 mg total) by mouth daily.   losartan 25 MG tablet Commonly known as:  COZAAR Take 0.5 tablets (12.5 mg total) by mouth at bedtime.   METAMUCIL PO Take 2 capsules by mouth 2 (two) times daily.   metoprolol succinate 25 MG 24 hr tablet Commonly known as:  TOPROL-XL Take 0.5 tablets (12.5 mg total) by mouth 2 (two) times daily.     spironolactone 25 MG tablet Commonly known as:  ALDACTONE Take 0.5 tablets (12.5 mg total) by mouth daily after supper. Take 1/2 tab once daily   spironolactone 25 MG tablet Commonly known as:  ALDACTONE TAKE 1/2 TABLET(12.5 MG) BY MOUTH DAILY   tamsulosin 0.4 MG Caps capsule Commonly known as:  FLOMAX Take 0.4 mg by mouth every other day.   tolterodine 4 MG 24 hr capsule Commonly known as:  DETROL LA Take 4 mg by mouth daily.   torsemide 20 MG tablet Commonly known as:  DEMADEX Take 1 tablet by mouth daily as needed.   warfarin 5 MG tablet Commonly known as:  COUMADIN Take 5 mg by mouth as directed.       Allergies:  Allergies  Allergen Reactions  . Celecoxib Rash and Other (See Comments)    Celebrex    Past Medical History, Surgical history, Social history, and Family History were reviewed and updated.  Review of Systems: All other 10 point review of systems is negative.   Physical Exam:  vitals were not taken for this visit.   Wt Readings from Last 3 Encounters:  07/10/17 190 lb 8 oz (86.4 kg)  06/19/17 190 lb (86.2 kg)  05/30/17 186 lb (84.4 kg)    Ocular: Sclerae unicteric, pupils equal,  round and reactive to light Ear-nose-throat: Oropharynx clear, dentition fair Lymphatic: No cervical, supraclavicular or axillary adenopathy Lungs no rales or rhonchi, good excursion bilaterally Heart regular rate and rhythm, no murmur appreciated Abd soft, nontender, positive bowel sounds, no liver or spleen tip palpated on exam, no fluid wave  MSK no focal spinal tenderness, no joint edema Neuro: non-focal, well-oriented, appropriate affect Breasts: Deferred   Lab Results  Component Value Date   WBC 9.1 10/16/2017   HGB 13.5 06/19/2017   HCT 38.0 (L) 10/16/2017   MCV 61.1 (L) 10/16/2017   PLT 207 10/16/2017   Lab Results  Component Value Date   FERRITIN 356 (H) 06/19/2017   IRON 99 06/19/2017   TIBC 243 06/19/2017   UIBC 143 06/19/2017   IRONPCTSAT 41  06/19/2017   Lab Results  Component Value Date   RBC 6.22 (H) 10/16/2017   No results found for: KPAFRELGTCHN, LAMBDASER, KAPLAMBRATIO No results found for: IGGSERUM, IGA, IGMSERUM No results found for: Ronnald Ramp, A1GS, Nelida Meuse, SPEI   Chemistry      Component Value Date/Time   NA 142 06/19/2017 1034   K 4.9 06/19/2017 1034   CL 104 03/21/2017 0928   CO2 28 06/19/2017 1034   BUN 18.9 06/19/2017 1034   CREATININE 1.0 06/19/2017 1034      Component Value Date/Time   CALCIUM 9.4 06/19/2017 1034   ALKPHOS 115 06/19/2017 1034   AST 49 (H) 06/19/2017 1034   ALT 53 06/19/2017 1034   BILITOT 1.52 (H) 06/19/2017 1034      Impression and Plan: Adrian Lambert is a very pleasant 77 yo caucasian gentleman with history of anemia. He continues to do well and has no complaints at this time. Hgb is stable at 12.7 with an MCV of 61.  We will see what his iron studies show and add a supplement if needed.  We will plan to see him back in another 6 months for follow-up.  He will contact our office with any questions or concerns. We can certainly see him sooner if need be.   Laverna Peace, NP 3/26/20198:46 AM

## 2017-10-18 ENCOUNTER — Encounter: Payer: Self-pay | Admitting: Cardiology

## 2017-10-18 ENCOUNTER — Ambulatory Visit: Payer: Medicare Other | Admitting: Cardiology

## 2017-10-18 VITALS — BP 110/64 | HR 73 | Ht 70.0 in | Wt 191.4 lb

## 2017-10-18 DIAGNOSIS — Z952 Presence of prosthetic heart valve: Secondary | ICD-10-CM | POA: Diagnosis not present

## 2017-10-18 DIAGNOSIS — I428 Other cardiomyopathies: Secondary | ICD-10-CM | POA: Diagnosis not present

## 2017-10-18 DIAGNOSIS — I5042 Chronic combined systolic (congestive) and diastolic (congestive) heart failure: Secondary | ICD-10-CM | POA: Diagnosis not present

## 2017-10-18 DIAGNOSIS — I712 Thoracic aortic aneurysm, without rupture, unspecified: Secondary | ICD-10-CM

## 2017-10-18 DIAGNOSIS — I480 Paroxysmal atrial fibrillation: Secondary | ICD-10-CM | POA: Diagnosis not present

## 2017-10-18 NOTE — Patient Instructions (Signed)
Continue your current therapy  I will see you in 6 months.   

## 2017-11-05 ENCOUNTER — Ambulatory Visit: Payer: Medicare Other | Admitting: Cardiology

## 2017-11-07 ENCOUNTER — Encounter: Payer: Self-pay | Admitting: Cardiology

## 2017-11-07 ENCOUNTER — Ambulatory Visit: Payer: Medicare Other | Admitting: Cardiology

## 2017-11-07 VITALS — BP 106/70 | HR 73 | Ht 70.0 in | Wt 184.0 lb

## 2017-11-07 DIAGNOSIS — I428 Other cardiomyopathies: Secondary | ICD-10-CM | POA: Diagnosis not present

## 2017-11-07 DIAGNOSIS — I4819 Other persistent atrial fibrillation: Secondary | ICD-10-CM

## 2017-11-07 DIAGNOSIS — I481 Persistent atrial fibrillation: Secondary | ICD-10-CM

## 2017-11-07 DIAGNOSIS — Z952 Presence of prosthetic heart valve: Secondary | ICD-10-CM

## 2017-11-07 NOTE — Patient Instructions (Signed)
Medication Instructions:  Your physician recommends that you continue on your current medications as directed. Please refer to the Current Medication list given to you today.  Labwork: None ordered  Testing/Procedures: None ordered  Follow-Up: Your physician wants you to follow-up in: 1 year with Dr. Camnitz.  You will receive a reminder letter in the mail two months in advance. If you don't receive a letter, please call our office to schedule the follow-up appointment.  * If you need a refill on your cardiac medications before your next appointment, please call your pharmacy.   *Please note that any paperwork needing to be filled out by the provider will need to be addressed at the front desk prior to seeing the provider. Please note that any FMLA, disability or other documents regarding health condition is subject to a $25.00 charge that must be received prior to completion of paperwork in the form of a money order or check.  Thank you for choosing [facility]!!   Winta Barcelo, RN [phone]        

## 2017-11-07 NOTE — Progress Notes (Signed)
Electrophysiology Office Note   Date:  11/07/2017   ID:  Adrian Lambert, DOB 1940-12-19, MRN [id number]  PCP:  Hulan Fess, MD  Cardiologist:  [place], [place] Primary Electrophysiologist:  Constance Haw, MD    Chief Complaint  Patient presents with  . Follow-up    PAF     History of Present Illness: Adrian Lambert is a 77 y.o. male who is being seen today for the evaluation of atrial fibrillation at the request of Hulan Fess, MD. Presenting today for electrophysiology evaluation. 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 He was admitted on 123 with volume overload, lower extremity edema and atrial fibrillation with rapid rates. His initial cardioversion attempt failed. He was diuresed with IV Lasix and was started on amiodarone and had successful cardioversion on 08/28/16. Had AF ablation 11/28/16 and improvement in his LVEF on his last echo to 45%.  Today, denies symptoms of palpitations, chest pain, shortness of breath, orthopnea, PND, lower extremity edema, claudication, dizziness, presyncope, syncope, bleeding, or neurologic sequela. The patient is tolerating medications without difficulties.  Overall he has been feeling well.  He has noted no further episodes of atrial fibrillation.  He does not have chest pain or shortness of breath.  Past Medical History:  Diagnosis Date  . Anticoagulant long-term use   . Arthritis    "probably in my thumbs" (06/26/2012)  . Bifascicular block   . Chronic combined systolic and diastolic CHF (congestive heart failure) ([place])    a. 01/2016 Echo: EF 45-50%, gr2 DD, inflat AK (poor acoustic windows);  b. 07/2016 Echo: EF 25-30%, mild LVH, PASP 81mmHg (pt in Afib).  . Complication of anesthesia    "wake up w/a start;  hallucinations" (06/26/2012)  . Critical Aortic Stenosis    a. 03/2003 s/p SJM #25 mech prosthetic AoV;  b. 07/2016 Echo: EF 25-30% (in setting of AF), AoV area 1.72 cm^2 (VTI), 1.75 cm^2 (Vmean).  . Diverticulosis   . Gouty arthritis    "have had it in both feet, ankles, right knee" (06/26/2012)  . History of diverticulitis of colon   . History of kidney stones   . History of pancreatitis   . Hypertension   . Incomplete RBBB   . PAF (paroxysmal atrial fibrillation) ([facility])    a. CHA2DS2VASc = 4-->chronic coumadin in setting of mech AoV;  b. 07/2016 Recurrent AF RVR.   Past Surgical History:  Procedure Laterality Date  . AORTIC VALVE REPLACEMENT  04-14-2003  DR Servando Snare   #16mm [facility] MECHNICAL PROSTHESIS  . ATRIAL FIBRILLATION ABLATION N/A 11/28/2016   Procedure: Atrial Fibrillation Ablation;  Surgeon: Constance Haw, MD;  Location: [facility];  Service: Cardiovascular;  Laterality: N/A;  . CARDIOVERSION N/A 08/18/2016   Procedure: CARDIOVERSION;  Surgeon: Thayer Headings, MD;  Location: [facility];  Service: Cardiovascular;  Laterality: N/A;  . CARDIOVERSION N/A 08/28/2016   Procedure: CARDIOVERSION;  Surgeon: Larey Dresser, MD;  Location: [place];  Service: Cardiovascular;  Laterality: N/A;  . CATARACT EXTRACTION W/ INTRAOCULAR LENS  IMPLANT, BILATERAL  RIGHT 2010/  LEFT DEC 2013  . HEMICOLECTOMY  10/06/2003   Laparoscopic-assisted left hemicolectomy for diverticulitis  . LAPAROSCOPIC CHOLECYSTECTOMY  07-23-2006  . PROSTATE BIOPSY  10/09/2012   gleason 3+4=7  . RADIOACTIVE SEED IMPLANT N/A  01/09/2013   Procedure: RADIOACTIVE SEED IMPLANT;  Surgeon: Franchot Gallo, MD;  Location: [facility];  Service: Urology;  Laterality: N/A;  . TEE WITHOUT CARDIOVERSION N/A 08/18/2016   Procedure: TRANSESOPHAGEAL ECHOCARDIOGRAM (TEE);  Surgeon: Thayer Headings, MD;  Location: [facility];  Service: Cardiovascular;  Laterality: N/A;  . TRANSTHORACIC ECHOCARDIOGRAM   04-28-2008   MILD LVH / NORMAL LSF/ NORMAL AORTIC VALVE MECHANICAL PROSTHESIS FUNCTION/ MILD LAE/ EF 55-60%     Current Outpatient Medications  Medication Sig Dispense Refill  . allopurinol (ZYLOPRIM) 300 MG tablet Take 300 mg by mouth daily.     Marland Kitchen aspirin 81 MG chewable tablet Chew 1 tablet (81 mg total) by mouth daily. 30 tablet 6  . colchicine 0.6 MG tablet Take 0.6 mg by mouth daily as needed (for gout).     Marland Kitchen docusate sodium (COLACE) 100 MG capsule Take 100 mg by mouth daily.    . folic acid (FOLVITE) 1 MG tablet Take 1 tablet (1 mg total) by mouth daily. 30 tablet 11  . losartan (COZAAR) 25 MG tablet Take 0.5 tablets (12.5 mg total) by mouth at bedtime. 45 tablet 3  . metoprolol succinate (TOPROL-XL) 25 MG 24 hr tablet Take 0.5 tablets (12.5 mg total) by mouth 2 (two) times daily. 15 tablet 3  . Psyllium (METAMUCIL PO) Take 2 capsules by mouth 2 (two) times daily.     Marland Kitchen spironolactone (ALDACTONE) 25 MG tablet TAKE 1/2 TABLET(12.5 MG) BY MOUTH DAILY 30 tablet 3  . tamsulosin (FLOMAX) 0.4 MG CAPS capsule Take 0.4 mg by mouth every other day.     . tolterodine (DETROL LA) 4 MG 24 hr capsule Take 4 mg by mouth daily.   0  . torsemide (DEMADEX) 20 MG tablet Take 1 tablet by mouth daily as needed.  3  . warfarin (COUMADIN) 5 MG tablet Take 5 mg by mouth as directed. 1/2 Tablet(2.5mg ) Monday, Wednesday, Friday. 1 tablet( 5mg )Tuesday, Thursday, Saturday, Sunday.     No current facility-administered medications for this visit.     Allergies:   Celecoxib   Social History:  The patient  reports that he has never smoked. He has never used smokeless tobacco. He reports that he drinks about 1.8 oz of alcohol per week. He reports that he does not use drugs.   Family History:  The patient's family history includes Diabetes in his mother; Heart failure in his mother; Prostate cancer in his father.   ROS:  Please see the history of present illness.   Otherwise, review of systems is positive for  none.   All other systems are reviewed and negative.   PHYSICAL EXAM: VS:  BP 106/70   Pulse 73   Ht 5\' 10"  (1.778 m)   Wt 184 lb (83.5 kg)   SpO2 98%   BMI 26.40 kg/m  , BMI Body mass index is 26.4 kg/m. GEN: Well nourished, well developed, in no acute distress  HEENT: normal  Neck: no JVD, carotid bruits, or masses Cardiac: RRR; no murmurs, rubs, or gallops,no edema  Respiratory:  clear to auscultation bilaterally, normal work of breathing GI: soft, nontender, nondistended, + BS MS: no deformity or atrophy  Skin: warm and dry Neuro:  Strength and sensation are intact Psych: euthymic mood, full affect  EKG:  EKG is not ordered today. Personal review of the ekg ordered 07/10/17 shows SR, iRBBB, anterior infarct  Recent Labs: 12/05/2016: TSH 3.210 06/19/2017: HGB 13.5 10/16/2017: ALT 26; BUN 17; Creatinine, Ser 0.99;  Platelet Count 207; Potassium 4.7; Sodium 139    Lipid Panel  No results found for: CHOL, TRIG, HDL, CHOLHDL, VLDL, LDLCALC, LDLDIRECT   Wt Readings from Last 3 Encounters:  11/07/17 184 lb (83.5 kg)  10/18/17 191 lb 6.4 oz (86.8 kg)  07/10/17 190 lb 8 oz (86.4 kg)      Other studies Reviewed: Additional studies/ records that were reviewed today include: TTE 12/05/16 Review of the above records today demonstrates:  - Left ventricle: The cavity size was normal. Wall thickness was   increased in a pattern of mild LVH. The estimated ejection   fraction was 45%. Diffuse hypokinesis. Doppler parameters are   consistent with abnormal left ventricular relaxation (grade 1   diastolic dysfunction). - Aortic valve: There is a Probation officer aortic valve. No   significant stenosis. There was trivial regurgitation. Mean   gradient (S): 12 mm Hg. - Aorta: Dilated ascending aorta. Ascending aortic diameter: 44 mm   (S). - Mitral valve: Mildly calcified annulus. There was trivial   regurgitation. - Left atrium: The atrium was mildly dilated. - Right ventricle:  The cavity size was normal. Systolic function   was mildly reduced. - Tricuspid valve: Peak RV-RA gradient (S): 21 mm Hg. - Pulmonary arteries: PA peak pressure: 24 mm Hg (S). - Inferior vena cava: The vessel was normal in size. The   respirophasic diameter changes were in the normal range (>= 50%),   consistent with normal central venous pressure.  ASSESSMENT AND PLAN:  1.  Atrial fibrillation: Warfarin for anticoagulation.  On auscultation he sounds to be in normal rhythm.  He is noted no further episodes of atrial fibrillation.  No changes.  This patients CHA2DS2-VASc Score and unadjusted Ischemic Stroke Rate (% per year) is equal to 4.8 % stroke rate/year from a score of 4  Above score calculated as 1 point each if present [CHF, HTN, DM, Vascular=MI/PAD/Aortic Plaque, Age if 65-74, or Male] Above score calculated as 2 points each if present [Age > 75, or Stroke/TIA/TE]   2. Chronic systolic heart failure: Ejection fraction has improved to 45%.  No signs of volume overload.  Currently on optimal medical therapy.  3. Aortic stenosis status post St. Jude mechanical valve: Valve functioning appropriately.  No changes.    Current medicines are reviewed at length with the patient today.   The patient does not have concerns regarding his medicines.  The following changes were made today: None  Labs/ tests ordered today include:  No orders of the defined types were placed in this encounter.    Disposition:   FU with Nahlia Hellmann 12 months  Signed, Itzae Mccurdy Meredith Leeds, MD  11/07/2017 2:51 PM     [place] [address] [place] [address] [phone] (office) [phone] (fax)

## 2017-11-14 DIAGNOSIS — Z952 Presence of prosthetic heart valve: Secondary | ICD-10-CM | POA: Diagnosis not present

## 2017-11-14 DIAGNOSIS — Z7901 Long term (current) use of anticoagulants: Secondary | ICD-10-CM | POA: Diagnosis not present

## 2017-11-15 ENCOUNTER — Ambulatory Visit (HOSPITAL_COMMUNITY)
Admission: RE | Admit: 2017-11-15 | Discharge: 2017-11-15 | Disposition: A | Discharge status: Home / Self Care | Payer: Medicare Other | Source: Ambulatory Visit | Attending: Cardiology | Admitting: Cardiology

## 2017-11-15 ENCOUNTER — Encounter (HOSPITAL_COMMUNITY): Payer: Self-pay | Admitting: Radiology

## 2017-11-15 DIAGNOSIS — I712 Thoracic aortic aneurysm, without rupture, unspecified: Secondary | ICD-10-CM

## 2017-11-15 DIAGNOSIS — H52223 Regular astigmatism, bilateral: Secondary | ICD-10-CM | POA: Diagnosis not present

## 2017-11-15 MED ORDER — GADOBENATE DIMEGLUMINE 529 MG/ML IV SOLN
18.0000 mL | Freq: Once | INTRAVENOUS | Status: AC | PRN
  Administered 2017-11-15: 18 mL via INTRAVENOUS

## 2017-11-28 DIAGNOSIS — Z7901 Long term (current) use of anticoagulants: Secondary | ICD-10-CM | POA: Diagnosis not present

## 2017-11-28 DIAGNOSIS — Z952 Presence of prosthetic heart valve: Secondary | ICD-10-CM | POA: Diagnosis not present

## 2017-12-03 ENCOUNTER — Encounter (HOSPITAL_COMMUNITY): Payer: Self-pay | Admitting: Cardiology

## 2017-12-03 ENCOUNTER — Ambulatory Visit (HOSPITAL_COMMUNITY)
Admission: RE | Admit: 2017-12-03 | Discharge: 2017-12-03 | Disposition: A | Discharge status: Home / Self Care | Payer: Medicare Other | Source: Ambulatory Visit | Attending: Family Medicine | Admitting: Family Medicine

## 2017-12-03 ENCOUNTER — Ambulatory Visit (HOSPITAL_BASED_OUTPATIENT_CLINIC_OR_DEPARTMENT_OTHER)
Admission: RE | Admit: 2017-12-03 | Discharge: 2017-12-03 | Disposition: A | Discharge status: Home / Self Care | Payer: Medicare Other | Source: Ambulatory Visit | Attending: Cardiology | Admitting: Cardiology

## 2017-12-03 VITALS — BP 128/78 | HR 75 | Wt 187.0 lb

## 2017-12-03 DIAGNOSIS — Z952 Presence of prosthetic heart valve: Secondary | ICD-10-CM | POA: Insufficient documentation

## 2017-12-03 DIAGNOSIS — Z7901 Long term (current) use of anticoagulants: Secondary | ICD-10-CM | POA: Diagnosis not present

## 2017-12-03 DIAGNOSIS — E669 Obesity, unspecified: Secondary | ICD-10-CM | POA: Insufficient documentation

## 2017-12-03 DIAGNOSIS — Z7982 Long term (current) use of aspirin: Secondary | ICD-10-CM | POA: Diagnosis not present

## 2017-12-03 DIAGNOSIS — I4891 Unspecified atrial fibrillation: Secondary | ICD-10-CM | POA: Insufficient documentation

## 2017-12-03 DIAGNOSIS — I5042 Chronic combined systolic (congestive) and diastolic (congestive) heart failure: Secondary | ICD-10-CM

## 2017-12-03 DIAGNOSIS — I110 Hypertensive heart disease with heart failure: Secondary | ICD-10-CM | POA: Diagnosis not present

## 2017-12-03 DIAGNOSIS — I452 Bifascicular block: Secondary | ICD-10-CM | POA: Diagnosis not present

## 2017-12-03 DIAGNOSIS — Z8546 Personal history of malignant neoplasm of prostate: Secondary | ICD-10-CM | POA: Insufficient documentation

## 2017-12-03 DIAGNOSIS — I059 Rheumatic mitral valve disease, unspecified: Secondary | ICD-10-CM | POA: Insufficient documentation

## 2017-12-03 DIAGNOSIS — I480 Paroxysmal atrial fibrillation: Secondary | ICD-10-CM

## 2017-12-03 DIAGNOSIS — I77810 Thoracic aortic ectasia: Secondary | ICD-10-CM | POA: Diagnosis not present

## 2017-12-03 DIAGNOSIS — I429 Cardiomyopathy, unspecified: Secondary | ICD-10-CM | POA: Insufficient documentation

## 2017-12-03 DIAGNOSIS — Z79899 Other long term (current) drug therapy: Secondary | ICD-10-CM | POA: Diagnosis not present

## 2017-12-03 MED ORDER — SPIRONOLACTONE 25 MG PO TABS: 25.0000 mg | ORAL_TABLET | Freq: Every day | ORAL | 3 refills | Status: DC

## 2017-12-03 NOTE — Patient Instructions (Signed)
Increase Spironolactone 25 mg (1 tab) daily   Your physician recommends that you return for lab work in: 10 days   Your physician recommends that you schedule a follow-up appointment in: 4 months with Dr. Aundra Dubin

## 2017-12-03 NOTE — Progress Notes (Signed)
  Echocardiogram 2D Echocardiogram has been performed.  Adrian Lambert 12/03/2017, 1:57 PM

## 2017-12-04 NOTE — Progress Notes (Signed)
PCP: Dr Rex Kras - Also manages [facility] Cardiology: Dr. Martinique  HF Cardiology: Dr Aundra Dubin   HPI: Adrian Lambert is a 77 y.o. male with a history of severe AS s/p St Jude mechanical AVR in 03/2003 on chronic coumadin, HTN, obesity, and mild LV dysfxn EF 40-45% on 01/2016 echo. Prior to admit in 1/18, he had a cough and dyspnea that was thought to be bronchitis so he was treated with abx and oral steroids. He was seen in cardiology office in 1/18 and was noted to be in rapid atrial fibrillation with significant volume overload.   Admitted 1/23 through 08/29/16 with marked volume overload, lower extremity edema, and atrial fibrillation with RVR.  Initial cardioversion failed and he was difficult to diurese. He was placed on milrinone for low output and diuresed with IV lasix with good response.  Weight came down considerably. Amiodarone was begun. Once fully diuresed, he had successful DC-CV on 08/28/16. Discharged on torsemide 60 mg daily. Discharge weight 212 pounds. Echo and TEE in 1/18 had shown fall in EF to around 25%.   He continued to lose weight as an outpatient.  He had a syncopal episode where he stood up from sitting and got lightheaded with transient loss of consciousness.  He had been having orthostatic symptoms.  We saw him in the office and cut back on torsemide from 60 mg daily down eventually to 20 mg daily.  Creatinine up some to 1.7 at that time.    At a prior appt, he was back in atrial fibrillation.  He was set up for DCCV in 3/18, but was in NSR when he arrived to short stay. He had atrial fibrillation ablation with Dr. Curt Bears in 5/18.   Echo was done in 5/18, EF up to 45% with mild diffuse hypokinesis, normal mechanical aortic valve, mildly decreased RV systolic function.    Echo was repeated today. EF 35-40%, diffuse hypokinesis, mildly decreased RV systolic function, stable mechanical aortic valve.   He returns for followup of CHF.  He is stable symptomatically. No dyspnea with normal  activities. No chest pain. No BRBPR/melena. Seems to have a little less energy than in the past. No orthopnea/PND. He takes torsemide about 5 times a month based on elevated weight.  Weight is down 3 lbs.    ECG (personally reviewed): NSR, LAFB, iRBBB, old ASMI   Labs (09/04/2016):  Hgb 15.9, K 5.0, Creatinine 1.77, digoxin 0.7, BNP 326  Labs (3/18): digoxin 0.8, K 4.8, creatinine 1.44, TSH normal, AST 54, ALT 49 Labs (4/18): K 4.3, creatinine 1.3 Labs (7/18): K 3.8, creatinine 0.9, hgb 11.9 Labs (11/18): K 4.9, creatinine 1.0, hgb 13.5 Labs (3/19): K 4.7,creatinine 0.99  PMH: 1. Aortic stenosis: # 25 St Jude mechanical aortic valve in 9/14.   2. Chronic systolic CHF: Echo in 2/50 with EF 45-50%.  Admitted in 1/18 with afib with RVR, ?tachycardia-mediated cardiomyopathy.  - Echo (1/18) with EF 25-30%, stable mechanical aortic valve.  - TEE (1/18) with EF 20-25%, stable mechanical aortic valve, moderate Adrian, small PFO.  - Admission 1/18 with acute on chronic systolic CHF, required milrinone, extensively diuresed.  - Echo (5/18) with EF 45%, diffuse hypokinesis, normal RV size with mildly decreased systolic function, mechanical aortic valve with mean gradient 14 mmHg, ascending aorta 4.4 cm.  - Echo (5/19) with EF 35-40%, diffuse hypokinesis, mild LVH, mildly decreased RV systolic function, mechanical aortic valve looked ok.   3. Atrial fibrillation: First noted in 5/39, uncertain how long it had  been present (with RVR).  08/28/2016 Successful DC-CV --> NSR.  Back in atrial fibrillation at 09/18/16 office visit.  Set up for DCCV in 3/18 but back in NSR.   - Atrial fibrillation ablation in 5/18. Now off amiodarone.  4. 09/04/2016 Syncope--> over-diuresis most likely.  5. HTN 6. Ascending aortic aneurysm: 4.4 cm on 7/17 echo, 4.4 cm on 5/18 echo.  - MRA chest (4/19): 4.5 cm ascending aortic aneurysm.  7. Prostate cancer: Treated with radioactive seed implantation.  8. Nephrolithiasis.  9. PFTs  (4/18): Mild restriction.   ROS: All systems negative except as listed in HPI, PMH and Problem List.  SH:  Social History   Socioeconomic History  . Marital status: Married    Spouse name: Not on file  . Number of children: 4  . Years of education: Not on file  . Highest education level: Not on file  Occupational History  . 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 Financial resource strain: Not on file  . Food insecurity:    Worry: Not on file    Inability: Not on file  . Transportation needs:    Medical: Not on file    Non-medical: Not on file  Tobacco Use  . Smoking status: Never Smoker  . Smokeless tobacco: Never Used  Substance and Sexual Activity  . Alcohol use: Yes    Alcohol/week: 1.8 oz    Types: 3 Cans of beer per week    Comment:  "may have 2-3 beers on the weekend"  . Drug use: No  . Sexual activity: Not on file  Lifestyle  . Physical activity:    Days per week: Not on file    Minutes per session: Not on file  . Stress: Not on file  Relationships  . Social connections:    Talks on phone: Not on file    Gets together: Not on file    Attends religious service: Not on file    Active member of club or organization: Not on file    Attends meetings of clubs or organizations: Not on file    Relationship status: Not on file  . Intimate partner violence:    Fear of current or ex partner: Not on file    Emotionally abused: Not on file    Physically abused: Not on file    Forced sexual activity: Not on file  Other Topics Concern  . Not on file  Social History Narrative  . Not on file    FH:  Family History  Problem Relation Age of Onset  . Heart failure Mother   . Diabetes Mother   . Prostate cancer Father     Current Outpatient Medications  Medication Sig Dispense Refill  . allopurinol (ZYLOPRIM) 300 MG tablet Take 300 mg by mouth daily.     Marland Kitchen aspirin 81 MG chewable tablet Chew 1 tablet (81 mg total) by mouth daily. 30 tablet  6  . colchicine 0.6 MG tablet Take 0.6 mg by mouth daily as needed (for gout).     Marland Kitchen docusate sodium (COLACE) 100 MG capsule Take 100 mg by mouth daily.    . folic acid (FOLVITE) 1 MG tablet Take 1 tablet (1 mg total) by mouth daily. 30 tablet 11  . losartan (COZAAR) 25 MG tablet Take 0.5 tablets (12.5 mg total) by mouth at bedtime. 45 tablet 3  . metoprolol succinate (TOPROL-XL) 25 MG 24 hr tablet Take 0.5 tablets (12.5 mg total) by  mouth 2 (two) times daily. 15 tablet 3  . Psyllium (METAMUCIL PO) Take 2 capsules by mouth 2 (two) times daily.     Marland Kitchen spironolactone (ALDACTONE) 25 MG tablet Take 1 tablet (25 mg total) by mouth daily. 30 tablet 3  . tamsulosin (FLOMAX) 0.4 MG CAPS capsule Take 0.4 mg by mouth every other day.     . tolterodine (DETROL LA) 4 MG 24 hr capsule Take 4 mg by mouth daily.   0  . torsemide (DEMADEX) 20 MG tablet Take 1 tablet by mouth daily as needed.  3  . warfarin (COUMADIN) 5 MG tablet Take 5 mg by mouth as directed. 1/2 Tablet(2.5mg ) Monday, Wednesday, Friday. 1 tablet( 5mg )Tuesday, Thursday, Saturday, Sunday.     No current facility-administered medications for this encounter.     Vitals:   12/03/17 1415  BP: 128/78  Pulse: 75  SpO2: 99%  Weight: 187 lb (84.8 kg)   Filed Weights   12/03/17 1415  Weight: 187 lb (84.8 kg)   PHYSICAL EXAM: General: NAD Neck: No JVD, no thyromegaly or thyroid nodule.  Lungs: Clear to auscultation bilaterally with normal respiratory effort. CV: Nondisplaced PMI.  Heart regular S1/S2 with mechanical S2, no S3/S4, 1/6 SEM RUSB.  Trace ankle edema.  No carotid bruit.  Normal pedal pulses.  Abdomen: Soft, nontender, no hepatosplenomegaly, no distention.  Skin: Intact without lesions or rashes.  Neurologic: Alert and oriented x 3.  Psych: Normal affect. Extremities: No clubbing or cyanosis.  HEENT: Normal.   ASSESSMENT & PLAN: 1. Chronic systolic CHF: EF on TEE in 1/18 20-25%, mechanical aortic valve ok. Fall in EF may have  been related to tachycardia-mediated CMP with rapid atrial fibrillation (echo 7/17 with EF 45-50%).  He required milrinone to assist diuresis during 1/18-2/18 admission.  Now that he is back in NSR s/p atrial fibrillation ablation, echo in 5/18 showed EF back up to 45%.  Echo today was reviewed and showed mildly lower systolic function, EF 57-84%. Currently, NYHA class II symptoms and does not appear volume overloaded.  Medication titration has been limited by lightheadedness, he is tolerating current regimen.   - He may continue prn use of torsemide.   - Increase spironolactone to 25 mg daily with BMET today and again in 10 days.  - Continue losartan 12.5 mg qhs.     - Continue Toprol XL 12.5 mg bid.  - Given no ACS/chest pain and improvement in EF (though a little lower today than a year ago), I think we can defer coronary angiography.  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  If he has any chest pain or further fall in EF, will need angiography.  2. Mechanical aortic valve: Continue warfarin and ASA 81. Valve looked ok on 5/19 echo. 3. Atrial fibrillation: Admitted 1/18 with atrial fibrillation with RVR and CHF.  EF down to 20-25%.  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  Need to keep him in NSR. DCCV during 2/18 admission and now s/p atrial fibrillation ablation.  He is in NSR today.  -  He is now off amiodarone.   4. Syncope: Suspect prior episode of syncope was due to over-diuresis (orthostatic).  5. Ascending aortic aneurysm: 4.5 cm on 4/19 MRA chest.  Repeat in 1 year.  E  Followup in 4 months.    [name] 12/04/2017

## 2017-12-13 ENCOUNTER — Ambulatory Visit (HOSPITAL_COMMUNITY)
Admission: RE | Admit: 2017-12-13 | Discharge: 2017-12-13 | Disposition: A | Discharge status: Home / Self Care | Payer: Medicare Other | Source: Ambulatory Visit | Attending: Cardiology | Admitting: Cardiology

## 2017-12-13 DIAGNOSIS — I5042 Chronic combined systolic (congestive) and diastolic (congestive) heart failure: Secondary | ICD-10-CM | POA: Insufficient documentation

## 2017-12-13 LAB — BASIC METABOLIC PANEL
Anion gap: 7 (ref 5–15)
BUN: 17 mg/dL (ref 6–20)
CO2: 27 mmol/L (ref 22–32)
CREATININE: 0.98 mg/dL (ref 0.61–1.24)
Calcium: 9.2 mg/dL (ref 8.9–10.3)
Chloride: 105 mmol/L (ref 101–111)
GFR calc Af Amer: >60 mL/min (ref >60)
GFR calc non Af Amer: >60 mL/min (ref >60)
Glucose, Bld: 113 mg/dL — ABNORMAL HIGH (ref 65–99)
Potassium: 4.6 mmol/L (ref 3.5–5.1)
SODIUM: 139 mmol/L (ref 135–145)

## 2017-12-19 DIAGNOSIS — Z952 Presence of prosthetic heart valve: Secondary | ICD-10-CM | POA: Diagnosis not present

## 2017-12-19 DIAGNOSIS — Z7901 Long term (current) use of anticoagulants: Secondary | ICD-10-CM | POA: Diagnosis not present

## 2018-01-17 DIAGNOSIS — I5022 Chronic systolic (congestive) heart failure: Secondary | ICD-10-CM | POA: Diagnosis not present

## 2018-01-17 DIAGNOSIS — I480 Paroxysmal atrial fibrillation: Secondary | ICD-10-CM | POA: Diagnosis not present

## 2018-01-17 DIAGNOSIS — R7301 Impaired fasting glucose: Secondary | ICD-10-CM | POA: Diagnosis not present

## 2018-01-17 DIAGNOSIS — Z0000 Encounter for general adult medical examination without abnormal findings: Secondary | ICD-10-CM | POA: Diagnosis not present

## 2018-01-18 DIAGNOSIS — Z8546 Personal history of malignant neoplasm of prostate: Secondary | ICD-10-CM | POA: Diagnosis not present

## 2018-01-28 DIAGNOSIS — M25562 Pain in left knee: Secondary | ICD-10-CM | POA: Diagnosis not present

## 2018-01-28 DIAGNOSIS — M25561 Pain in right knee: Secondary | ICD-10-CM | POA: Diagnosis not present

## 2018-01-30 DIAGNOSIS — N401 Enlarged prostate with lower urinary tract symptoms: Secondary | ICD-10-CM | POA: Diagnosis not present

## 2018-01-30 DIAGNOSIS — N3281 Overactive bladder: Secondary | ICD-10-CM | POA: Diagnosis not present

## 2018-01-30 DIAGNOSIS — Z8546 Personal history of malignant neoplasm of prostate: Secondary | ICD-10-CM | POA: Diagnosis not present

## 2018-01-30 DIAGNOSIS — R351 Nocturia: Secondary | ICD-10-CM | POA: Diagnosis not present

## 2018-02-27 DIAGNOSIS — M25561 Pain in right knee: Secondary | ICD-10-CM | POA: Diagnosis not present

## 2018-03-06 DIAGNOSIS — Z7901 Long term (current) use of anticoagulants: Secondary | ICD-10-CM | POA: Diagnosis not present

## 2018-03-06 DIAGNOSIS — Z952 Presence of prosthetic heart valve: Secondary | ICD-10-CM | POA: Diagnosis not present

## 2018-03-07 DIAGNOSIS — Z85820 Personal history of malignant melanoma of skin: Secondary | ICD-10-CM | POA: Diagnosis not present

## 2018-03-07 DIAGNOSIS — L570 Actinic keratosis: Secondary | ICD-10-CM | POA: Diagnosis not present

## 2018-03-07 DIAGNOSIS — L821 Other seborrheic keratosis: Secondary | ICD-10-CM | POA: Diagnosis not present

## 2018-03-07 DIAGNOSIS — D1801 Hemangioma of skin and subcutaneous tissue: Secondary | ICD-10-CM | POA: Diagnosis not present

## 2018-03-19 ENCOUNTER — Other Ambulatory Visit (HOSPITAL_COMMUNITY): Payer: Self-pay

## 2018-03-19 MED ORDER — METOPROLOL SUCCINATE ER 25 MG PO TB24: 12.5000 mg | ORAL_TABLET | Freq: Two times a day (BID) | ORAL | 3 refills | Status: DC

## 2018-03-31 ENCOUNTER — Other Ambulatory Visit: Payer: Self-pay | Admitting: Family

## 2018-03-31 DIAGNOSIS — D500 Iron deficiency anemia secondary to blood loss (chronic): Secondary | ICD-10-CM

## 2018-04-01 ENCOUNTER — Other Ambulatory Visit (HOSPITAL_COMMUNITY): Payer: Self-pay | Admitting: *Deleted

## 2018-04-01 MED ORDER — SPIRONOLACTONE 25 MG PO TABS: 25.0000 mg | ORAL_TABLET | Freq: Every day | ORAL | 3 refills | Status: DC

## 2018-04-03 ENCOUNTER — Other Ambulatory Visit (HOSPITAL_COMMUNITY): Payer: Self-pay | Admitting: Adult Health

## 2018-04-08 ENCOUNTER — Other Ambulatory Visit: Payer: Self-pay

## 2018-04-08 ENCOUNTER — Ambulatory Visit (HOSPITAL_COMMUNITY)
Admission: RE | Admit: 2018-04-08 | Discharge: 2018-04-08 | Disposition: A | Discharge status: Home / Self Care | Payer: Medicare Other | Source: Ambulatory Visit | Attending: Cardiology | Admitting: Cardiology

## 2018-04-08 ENCOUNTER — Encounter (HOSPITAL_COMMUNITY): Payer: Self-pay | Admitting: Cardiology

## 2018-04-08 VITALS — BP 115/72 | HR 65 | Wt 191.0 lb

## 2018-04-08 DIAGNOSIS — Z7901 Long term (current) use of anticoagulants: Secondary | ICD-10-CM | POA: Diagnosis not present

## 2018-04-08 DIAGNOSIS — Z7982 Long term (current) use of aspirin: Secondary | ICD-10-CM | POA: Insufficient documentation

## 2018-04-08 DIAGNOSIS — Z8546 Personal history of malignant neoplasm of prostate: Secondary | ICD-10-CM | POA: Diagnosis not present

## 2018-04-08 DIAGNOSIS — R9431 Abnormal electrocardiogram [ECG] [EKG]: Secondary | ICD-10-CM | POA: Diagnosis not present

## 2018-04-08 DIAGNOSIS — I480 Paroxysmal atrial fibrillation: Secondary | ICD-10-CM

## 2018-04-08 DIAGNOSIS — Z952 Presence of prosthetic heart valve: Secondary | ICD-10-CM | POA: Diagnosis not present

## 2018-04-08 DIAGNOSIS — I4510 Unspecified right bundle-branch block: Secondary | ICD-10-CM | POA: Diagnosis not present

## 2018-04-08 DIAGNOSIS — I5042 Chronic combined systolic (congestive) and diastolic (congestive) heart failure: Secondary | ICD-10-CM

## 2018-04-08 DIAGNOSIS — I712 Thoracic aortic aneurysm, without rupture: Secondary | ICD-10-CM | POA: Diagnosis not present

## 2018-04-08 DIAGNOSIS — I482 Chronic atrial fibrillation: Secondary | ICD-10-CM | POA: Insufficient documentation

## 2018-04-08 DIAGNOSIS — Z79899 Other long term (current) drug therapy: Secondary | ICD-10-CM | POA: Diagnosis not present

## 2018-04-08 DIAGNOSIS — I350 Nonrheumatic aortic (valve) stenosis: Secondary | ICD-10-CM | POA: Diagnosis not present

## 2018-04-08 DIAGNOSIS — I110 Hypertensive heart disease with heart failure: Secondary | ICD-10-CM | POA: Diagnosis not present

## 2018-04-08 DIAGNOSIS — Z09 Encounter for follow-up examination after completed treatment for conditions other than malignant neoplasm: Secondary | ICD-10-CM | POA: Insufficient documentation

## 2018-04-08 DIAGNOSIS — I5022 Chronic systolic (congestive) heart failure: Secondary | ICD-10-CM | POA: Diagnosis not present

## 2018-04-08 LAB — BASIC METABOLIC PANEL
Anion gap: 10 (ref 5–15)
BUN: 14 mg/dL (ref 8–23)
CHLORIDE: 105 mmol/L (ref 98–111)
CO2: 26 mmol/L (ref 22–32)
CREATININE: 0.9 mg/dL (ref 0.61–1.24)
Calcium: 9.2 mg/dL (ref 8.9–10.3)
GFR calc non Af Amer: >60 mL/min (ref >60)
GLUCOSE: 107 mg/dL — AB (ref 70–99)
Potassium: 4.9 mmol/L (ref 3.5–5.1)
Sodium: 141 mmol/L (ref 135–145)

## 2018-04-08 MED ORDER — LOSARTAN POTASSIUM 25 MG PO TABS: 25.0000 mg | ORAL_TABLET | Freq: Every day | ORAL | 3 refills | Status: DC

## 2018-04-08 NOTE — Progress Notes (Signed)
PCP: Dr Rex Kras - Also manages [facility] Cardiology: Dr. Martinique  HF Cardiology: Dr Aundra Dubin   HPI: [name] is a 77 y.o. male with a history of severe AS s/p St Jude mechanical AVR in 03/2003 on chronic coumadin, HTN, obesity, and mild LV dysfxn EF 40-45% on 01/2016 echo. Prior to admit in 1/18, he had a cough and dyspnea that was thought to be bronchitis so he was treated with abx and oral steroids. He was seen in cardiology office in 1/18 and was noted to be in rapid atrial fibrillation with significant volume overload.   Admitted 1/23 through 08/29/16 with marked volume overload, lower extremity edema, and atrial fibrillation with RVR.  Initial cardioversion failed and he was difficult to diurese. He was placed on milrinone for low output and diuresed with IV lasix with good response.  Weight came down considerably. Amiodarone was begun. Once fully diuresed, he had successful DC-CV on 08/28/16. Discharged on torsemide 60 mg daily. Discharge weight 212 pounds. Echo and TEE in 1/18 had shown fall in EF to around 25%.   He continued to lose weight as an outpatient.  He had a syncopal episode where he stood up from sitting and got lightheaded with transient loss of consciousness.  He had been having orthostatic symptoms.  We saw him in the office and cut back on torsemide from 60 mg daily down eventually to 20 mg daily.  Creatinine up some to 1.7 at that time.    At a prior appt, he was back in atrial fibrillation.  He was set up for DCCV in 3/18, but was in NSR when he arrived to short stay. He had atrial fibrillation ablation with Dr. Curt Bears in 5/18.   Echo was done in 5/18, EF up to 45% with mild diffuse hypokinesis, normal mechanical aortic valve, mildly decreased RV systolic function.    Echo was repeated in 5/19, EF 35-40%, diffuse hypokinesis, mildly decreased RV systolic function, stable mechanical aortic valve.   He returns for followup of CHF.  Weight is up about 4 lbs today.  He takes torsemide  around twice a month.  No dyspnea with his normal activities.  No chest pain.  No orthopnea/PND.    ECG (personally reviewed): NSR, poor RWP, LAFB   Labs (09/04/2016):  Hgb 15.9, K 5.0, Creatinine 1.77, digoxin 0.7, BNP 326  Labs (3/18): digoxin 0.8, K 4.8, creatinine 1.44, TSH normal, AST 54, ALT 49 Labs (4/18): K 4.3, creatinine 1.3 Labs (7/18): K 3.8, creatinine 0.9, hgb 11.9 Labs (11/18): K 4.9, creatinine 1.0, hgb 13.5 Labs (3/19): K 4.7,creatinine 0.99 Labs (5/19): K 4.6, creatinine 0.98  PMH: 1. Aortic stenosis: # 25 St Jude mechanical aortic valve in 9/14.   2. Chronic systolic CHF: Echo in 5/36 with EF 45-50%.  Admitted in 1/18 with afib with RVR, ?tachycardia-mediated cardiomyopathy.  - Echo (1/18) with EF 25-30%, stable mechanical aortic valve.  - TEE (1/18) with EF 20-25%, stable mechanical aortic valve, moderate MR, small PFO.  - Admission 1/18 with acute on chronic systolic CHF, required milrinone, extensively diuresed.  - Echo (5/18) with EF 45%, diffuse hypokinesis, normal RV size with mildly decreased systolic function, mechanical aortic valve with mean gradient 14 mmHg, ascending aorta 4.4 cm.  - Echo (5/19) with EF 35-40%, diffuse hypokinesis, mild LVH, mildly decreased RV systolic function, mechanical aortic valve looked ok.   3. Atrial fibrillation: First noted in 1/44, uncertain how long it had been present (with RVR).  08/28/2016 Successful DC-CV --> NSR.  Back in atrial fibrillation at 09/18/16 office visit.  Set up for DCCV in 3/18 but back in NSR.   - Atrial fibrillation ablation in 5/18. Now off amiodarone.  4. 09/04/2016 Syncope--> over-diuresis most likely.  5. HTN 6. Ascending aortic aneurysm: 4.4 cm on 7/17 echo, 4.4 cm on 5/18 echo.  - MRA chest (4/19): 4.5 cm ascending aortic aneurysm.  7. Prostate cancer: Treated with radioactive seed implantation.  8. Nephrolithiasis.  9. PFTs (4/18): Mild restriction.   ROS: All systems negative except as listed in HPI,  PMH and Problem List.  SH:  Social History   Socioeconomic History  . Marital status: Married    Spouse name: Not on file  . Number of children: 4  . Years of education: Not on file  . Highest education level: Not on file  Occupational History  . 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 Financial resource strain: Not on file  . Food insecurity:    Worry: Not on file    Inability: Not on file  . Transportation needs:    Medical: Not on file    Non-medical: Not on file  Tobacco Use  . Smoking status: Never Smoker  . Smokeless tobacco: Never Used  Substance and Sexual Activity  . Alcohol use: Yes    Alcohol/week: 3.0 standard drinks    Types: 3 Cans of beer per week    Comment:  "may have 2-3 beers on the weekend"  . Drug use: No  . Sexual activity: Not on file  Lifestyle  . Physical activity:    Days per week: Not on file    Minutes per session: Not on file  . Stress: Not on file  Relationships  . Social connections:    Talks on phone: Not on file    Gets together: Not on file    Attends religious service: Not on file    Active member of club or organization: Not on file    Attends meetings of clubs or organizations: Not on file    Relationship status: Not on file  . Intimate partner violence:    Fear of current or ex partner: Not on file    Emotionally abused: Not on file    Physically abused: Not on file    Forced sexual activity: Not on file  Other Topics Concern  . Not on file  Social History Narrative  . Not on file    FH:  Family History  Problem Relation Age of Onset  . Heart failure Mother   . Diabetes Mother   . Prostate cancer Father     Current Outpatient Medications  Medication Sig Dispense Refill  . allopurinol (ZYLOPRIM) 300 MG tablet Take 300 mg by mouth daily.     Marland Kitchen aspirin 81 MG chewable tablet Chew 1 tablet (81 mg total) by mouth daily. 30 tablet 6  . colchicine 0.6 MG tablet Take 0.6 mg by mouth daily as needed  (for gout).     Marland Kitchen docusate sodium (COLACE) 100 MG capsule Take 100 mg by mouth daily.    . folic acid (FOLVITE) 1 MG tablet TAKE 1 TABLET(1 MG) BY MOUTH DAILY 30 tablet 0  . losartan (COZAAR) 25 MG tablet Take 1 tablet (25 mg total) by mouth at bedtime. 90 tablet 3  . metoprolol succinate (TOPROL-XL) 25 MG 24 hr tablet Take 0.5 tablets (12.5 mg total) by mouth 2 (two) times daily. 30 tablet 3  . Psyllium (METAMUCIL  PO) Take 2 capsules by mouth 2 (two) times daily.     Marland Kitchen spironolactone (ALDACTONE) 25 MG tablet Take 1 tablet (25 mg total) by mouth daily. 30 tablet 3  . tamsulosin (FLOMAX) 0.4 MG CAPS capsule Take 0.4 mg by mouth every other day.     . tolterodine (DETROL LA) 4 MG 24 hr capsule Take 4 mg by mouth daily.   0  . torsemide (DEMADEX) 20 MG tablet Take 1 tablet by mouth daily as needed.  3  . warfarin (COUMADIN) 5 MG tablet Take 5 mg by mouth as directed. 1/2 Tablet(2.5mg ) Monday, Wednesday, Friday. 1 tablet( 5mg )Tuesday, Thursday, Saturday, Sunday.     No current facility-administered medications for this encounter.     Vitals:   04/08/18 0908  BP: 115/72  Pulse: 65  SpO2: 99%  Weight: 86.6 kg (191 lb)   Filed Weights   04/08/18 0908  Weight: 86.6 kg (191 lb)   PHYSICAL EXAM: General: NAD Neck: No JVD, no thyromegaly or thyroid nodule.  Lungs: Clear to auscultation bilaterally with normal respiratory effort. CV: Nondisplaced PMI.  Heart regular S1/S2 with mechanical S2, no S3/S4, 1/6 SEM RUSB.  1+ ankle edema.  No carotid bruit.  Normal pedal pulses.  Abdomen: Soft, nontender, no hepatosplenomegaly, no distention.  Skin: Intact without lesions or rashes.  Neurologic: Alert and oriented x 3.  Psych: Normal affect. Extremities: No clubbing or cyanosis.  HEENT: Normal.    ASSESSMENT & PLAN: 1. Chronic systolic CHF: EF on TEE in 1/18 20-25%, mechanical aortic valve ok. Fall in EF may have been related to tachycardia-mediated CMP with rapid atrial fibrillation (echo 7/17  with EF 45-50%).  He required milrinone to assist diuresis during 1/18-2/18 admission.  Now that he is back in NSR s/p atrial fibrillation ablation, echo in 5/18 showed EF back up to 45%.  Echo in 5/19 showed mildly lower systolic function, EF 78-29%. Currently, NYHA class II symptoms and does not appear significantly volume overloaded.  Medication titration has been limited by lightheadedness though he is tolerating current regimen.   - He may continue prn use of torsemide.   - Continue spironolactone 25 mg daily.  - Increase losartan to 25 mg daily.  BMET today and again in 10 days.      - Continue Toprol XL 12.5 mg bid. I will have him followup with HF pharmacist in 3 wks to see if we can increase Toprol XL.  - Given no ACS/chest pain and stabilization of EF, I think we can defer coronary angiography.  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  If he has any chest pain or further fall in EF, will need angiography.  2. Mechanical aortic valve: Continue warfarin and ASA 81. Valve looked ok on 5/19 echo. 3. Atrial fibrillation: Admitted 1/18 with atrial fibrillation with RVR and CHF.  EF down to 20-25%.  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  Need to keep him in NSR. DCCV during 2/18 admission and now s/p atrial fibrillation ablation.  He is in NSR today.  -  He is now off amiodarone.   - Continue warfarin.  4. Ascending aortic aneurysm: 4.5 cm on 4/19 MRA chest.  Repeat in 1 year.    Followup in 3 wks with HF pharmacist for med titration, see me in 3 months.   [name] 04/08/2018

## 2018-04-08 NOTE — Patient Instructions (Signed)
Labs today (will call for abnormal results, otherwise no news is good news)  INCREASE Losartan to 25 mg Once Daily  Labs in 10 days (bmet)  Follow up in our pharmacy clinic in 3 weeks.  Follow up with Dr. Aundra Dubin in 3 months.

## 2018-04-11 DIAGNOSIS — Z23 Encounter for immunization: Secondary | ICD-10-CM | POA: Diagnosis not present

## 2018-04-16 ENCOUNTER — Inpatient Hospital Stay: Payer: Medicare Other | Attending: Family | Admitting: Family

## 2018-04-16 ENCOUNTER — Encounter: Payer: Self-pay | Admitting: Family

## 2018-04-16 ENCOUNTER — Other Ambulatory Visit: Payer: Self-pay

## 2018-04-16 ENCOUNTER — Inpatient Hospital Stay: Payer: Medicare Other

## 2018-04-16 VITALS — BP 104/66 | HR 70 | Temp 97.9°F | Resp 17 | Wt 186.4 lb

## 2018-04-16 DIAGNOSIS — D509 Iron deficiency anemia, unspecified: Secondary | ICD-10-CM | POA: Diagnosis not present

## 2018-04-16 DIAGNOSIS — Z7901 Long term (current) use of anticoagulants: Secondary | ICD-10-CM | POA: Diagnosis not present

## 2018-04-16 DIAGNOSIS — D500 Iron deficiency anemia secondary to blood loss (chronic): Secondary | ICD-10-CM

## 2018-04-16 LAB — CMP (CANCER CENTER ONLY)
ALBUMIN: 4.3 g/dL (ref 3.5–5.0)
ALT: 26 U/L (ref 0–44)
ANION GAP: 10 (ref 5–15)
AST: 33 U/L (ref 15–41)
Alkaline Phosphatase: 98 U/L (ref 38–126)
BILIRUBIN TOTAL: 1.8 mg/dL — AB (ref 0.3–1.2)
BUN: 22 mg/dL (ref 8–23)
CO2: 29 mmol/L (ref 22–32)
Calcium: 9.6 mg/dL (ref 8.9–10.3)
Chloride: 103 mmol/L (ref 98–111)
Creatinine: 1.04 mg/dL (ref 0.61–1.24)
Glucose, Bld: 105 mg/dL — ABNORMAL HIGH (ref 70–99)
POTASSIUM: 4.5 mmol/L (ref 3.5–5.1)
Sodium: 142 mmol/L (ref 135–145)
TOTAL PROTEIN: 7.1 g/dL (ref 6.5–8.1)

## 2018-04-16 LAB — CBC WITH DIFFERENTIAL (CANCER CENTER ONLY)
BASOS PCT: 1 %
Basophils Absolute: 0.1 10*3/uL (ref 0.0–0.1)
Eosinophils Absolute: 0.3 10*3/uL (ref 0.0–0.5)
Eosinophils Relative: 3 %
HEMATOCRIT: 41 % (ref 38.7–49.9)
Hemoglobin: 13.6 g/dL (ref 13.0–17.1)
Lymphocytes Relative: 20 %
Lymphs Abs: 1.9 10*3/uL (ref 0.9–3.3)
MCH: 20.2 pg — ABNORMAL LOW (ref 28.0–33.4)
MCHC: 33.2 g/dL (ref 32.0–35.9)
MCV: 60.8 fL — ABNORMAL LOW (ref 82.0–98.0)
MONO ABS: 0.7 10*3/uL (ref 0.1–0.9)
MONOS PCT: 8 %
NEUTROS ABS: 6.7 10*3/uL — AB (ref 1.5–6.5)
Neutrophils Relative %: 68 %
Platelet Count: 182 10*3/uL (ref 145–400)
RBC: 6.74 MIL/uL — ABNORMAL HIGH (ref 4.20–5.70)
RDW: 18.7 % — AB (ref 11.1–15.7)
WBC Count: 9.7 10*3/uL (ref 4.0–10.0)

## 2018-04-16 LAB — IRON AND TIBC
IRON: 97 ug/dL (ref 42–163)
Saturation Ratios: 35 % — ABNORMAL LOW (ref 42–163)
TIBC: 275 ug/dL (ref 202–409)
UIBC: 178 ug/dL

## 2018-04-16 LAB — FERRITIN: Ferritin: 364 ng/mL — ABNORMAL HIGH (ref 24–336)

## 2018-04-16 NOTE — Progress Notes (Signed)
Hematology and Oncology Follow Up Visit  Adrian Lambert [id number] 10-Oct-1940 77 y.o. 04/16/2018   Principle Diagnosis:  Iron deficiency anemia  Current Therapy:   Observation   Interim History: Adrian Lambert is here today for follow-up. He is doing quite well and has no complaints at this time.  Hgb is 13.6 with an MCV of 60.8. He states that he is taking his folic acid daily as prescribed.  He has occasional SOB with over exertion and will take breaks to rest when needed.  He has had no episodes of bleeding. He does bruise easily on Coumadin.  No fever, chills, n/v, cough, rash, dizziness, SOB, chest pain, palpitations, abdominal pain or changes in bowel or bladder habits.  No swelling or tenderness in his extremities at this time. He will have some tenderness and tingling in his hands due to arthritis at times.  No lymphadenopathy noted on exam.  He has maintained a good appetite and is staying well hydrated. His weight is stable.   ECOG Performance Status: 0 - Asymptomatic  Medications:  Allergies as of 04/16/2018      Reactions   Celecoxib Rash, Other (See Comments)   Celebrex      Medication List        Accurate as of 04/16/18  8:47 AM. Always use your most recent med list.          allopurinol 300 MG tablet Commonly known as:  ZYLOPRIM Take 300 mg by mouth daily.   aspirin 81 MG chewable tablet Chew 1 tablet (81 mg total) by mouth daily.   colchicine 0.6 MG tablet Take 0.6 mg by mouth daily as needed (for gout).   docusate sodium 100 MG capsule Commonly known as:  COLACE Take 100 mg by mouth 2 (two) times daily.   folic acid 1 MG tablet Commonly known as:  FOLVITE TAKE 1 TABLET(1 MG) BY MOUTH DAILY   losartan 25 MG tablet Commonly known as:  COZAAR Take 1 tablet (25 mg total) by mouth at bedtime.   METAMUCIL PO Take 2 capsules by mouth 2 (two) times daily.   metoprolol succinate 25 MG 24 hr tablet Commonly known as:  TOPROL-XL Take 0.5 tablets  (12.5 mg total) by mouth 2 (two) times daily.   spironolactone 25 MG tablet Commonly known as:  ALDACTONE Take 1 tablet (25 mg total) by mouth daily.   tamsulosin 0.4 MG Caps capsule Commonly known as:  FLOMAX Take 0.4 mg by mouth every other day.   tolterodine 4 MG 24 hr capsule Commonly known as:  DETROL LA Take 4 mg by mouth every other day.   torsemide 20 MG tablet Commonly known as:  DEMADEX Take 1 tablet by mouth daily as needed.   warfarin 5 MG tablet Commonly known as:  COUMADIN Take 5 mg by mouth as directed. 1/2 Tablet(2.5mg ) Monday, Wednesday, Friday. 1 tablet( 5mg )Tuesday, Thursday, Saturday, Sunday.       Allergies:  Allergies  Allergen Reactions  . Celecoxib Rash and Other (See Comments)    Celebrex    Past Medical History, Surgical history, Social history, and Family History were reviewed and updated.  Review of Systems: All other 10 point review of systems is negative.   Physical Exam:  weight is 186 lb 6.4 oz (84.6 kg). His oral temperature is 97.9 F (36.6 C). His blood pressure is 104/66 and his pulse is 70. His respiration is 17 and oxygen saturation is 100%.   Wt Readings from Last 3 Encounters:  04/16/18 186 lb 6.4 oz (84.6 kg)  04/08/18 191 lb (86.6 kg)  12/03/17 187 lb (84.8 kg)    Ocular: Sclerae unicteric, pupils equal, round and reactive to light Ear-nose-throat: Oropharynx clear, dentition fair Lymphatic: No cervical, supraclavicular or axillary adenopathy Lungs no rales or rhonchi, good excursion bilaterally Heart regular rate and rhythm, no murmur appreciated Abd soft, nontender, positive bowel sounds, no liver or spleen tip palpated on exam, no fluid wave  MSK no focal spinal tenderness, no joint edema Neuro: non-focal, well-oriented, appropriate affect Breasts: Deferred   Lab Results  Component Value Date   WBC 9.7 04/16/2018   HGB 13.6 04/16/2018   HCT 41.0 04/16/2018   MCV 60.8 (L) 04/16/2018   PLT 182 04/16/2018   Lab  Results  Component Value Date   FERRITIN 336 (H) 10/16/2017   IRON 67 10/16/2017   TIBC 235 10/16/2017   UIBC 168 10/16/2017   IRONPCTSAT 28 (L) 10/16/2017   Lab Results  Component Value Date   RBC 6.74 (H) 04/16/2018   No results found for: KPAFRELGTCHN, LAMBDASER, KAPLAMBRATIO No results found for: IGGSERUM, IGA, IGMSERUM No results found for: Odetta Pink, SPEI   Chemistry      Component Value Date/Time   NA 141 04/08/2018 1026   NA 142 06/19/2017 1034   K 4.9 04/08/2018 1026   K 4.9 06/19/2017 1034   CL 105 04/08/2018 1026   CL 104 03/21/2017 0928   CO2 26 04/08/2018 1026   CO2 28 06/19/2017 1034   BUN 14 04/08/2018 1026   BUN 18.9 06/19/2017 1034   CREATININE 0.90 04/08/2018 1026   CREATININE 1.0 06/19/2017 1034      Component Value Date/Time   CALCIUM 9.2 04/08/2018 1026   CALCIUM 9.4 06/19/2017 1034   ALKPHOS 130 10/16/2017 0820   ALKPHOS 115 06/19/2017 1034   AST 34 10/16/2017 0820   AST 49 (H) 06/19/2017 1034   ALT 26 10/16/2017 0820   ALT 53 06/19/2017 1034   BILITOT 1.2 10/16/2017 0820   BILITOT 1.52 (H) 06/19/2017 1034      Impression and Plan: Adrian Lambert is a very pleasant 77 yo caucasian gentleman with history of anemia. He continues to do well and has no complaints at this time. Hgb is stable at 13.6 and MCV 60.8.  He is taking his folic acid daily.  We will see what his iron studies show and replace if needed.  We will go ahead and plan to see him back in another 8 months for follow-up.  He will contact our office with any questions or concerns. We can certainly see him sooner if need be.   Laverna Peace, NP 9/24/20198:47 AM

## 2018-04-18 ENCOUNTER — Ambulatory Visit (HOSPITAL_COMMUNITY)
Admission: RE | Admit: 2018-04-18 | Discharge: 2018-04-18 | Disposition: A | Discharge status: Home / Self Care | Payer: Medicare Other | Source: Ambulatory Visit | Attending: Cardiology | Admitting: Cardiology

## 2018-04-18 DIAGNOSIS — I5042 Chronic combined systolic (congestive) and diastolic (congestive) heart failure: Secondary | ICD-10-CM | POA: Diagnosis not present

## 2018-04-18 LAB — BASIC METABOLIC PANEL
Anion gap: 8 (ref 5–15)
BUN: 18 mg/dL (ref 8–23)
CALCIUM: 8.9 mg/dL (ref 8.9–10.3)
CO2: 27 mmol/L (ref 22–32)
CREATININE: 0.91 mg/dL (ref 0.61–1.24)
Chloride: 106 mmol/L (ref 98–111)
GFR calc non Af Amer: >60 mL/min (ref >60)
Glucose, Bld: 106 mg/dL — ABNORMAL HIGH (ref 70–99)
Potassium: 4.2 mmol/L (ref 3.5–5.1)
SODIUM: 141 mmol/L (ref 135–145)

## 2018-04-19 DIAGNOSIS — M25562 Pain in left knee: Secondary | ICD-10-CM | POA: Diagnosis not present

## 2018-04-19 DIAGNOSIS — M25561 Pain in right knee: Secondary | ICD-10-CM | POA: Diagnosis not present

## 2018-04-28 ENCOUNTER — Other Ambulatory Visit: Payer: Self-pay | Admitting: Family

## 2018-04-28 DIAGNOSIS — D500 Iron deficiency anemia secondary to blood loss (chronic): Secondary | ICD-10-CM

## 2018-04-30 ENCOUNTER — Ambulatory Visit (HOSPITAL_COMMUNITY)
Admission: RE | Admit: 2018-04-30 | Discharge: 2018-04-30 | Disposition: A | Discharge status: Home / Self Care | Payer: Medicare Other | Source: Ambulatory Visit | Attending: Cardiology | Admitting: Cardiology

## 2018-04-30 DIAGNOSIS — Z6827 Body mass index (BMI) 27.0-27.9, adult: Secondary | ICD-10-CM | POA: Diagnosis not present

## 2018-04-30 DIAGNOSIS — Z7901 Long term (current) use of anticoagulants: Secondary | ICD-10-CM | POA: Diagnosis not present

## 2018-04-30 DIAGNOSIS — Z954 Presence of other heart-valve replacement: Secondary | ICD-10-CM | POA: Insufficient documentation

## 2018-04-30 DIAGNOSIS — I110 Hypertensive heart disease with heart failure: Secondary | ICD-10-CM | POA: Diagnosis not present

## 2018-04-30 DIAGNOSIS — E669 Obesity, unspecified: Secondary | ICD-10-CM | POA: Diagnosis not present

## 2018-04-30 DIAGNOSIS — I4891 Unspecified atrial fibrillation: Secondary | ICD-10-CM | POA: Diagnosis not present

## 2018-04-30 DIAGNOSIS — I5022 Chronic systolic (congestive) heart failure: Secondary | ICD-10-CM | POA: Diagnosis not present

## 2018-04-30 DIAGNOSIS — I714 Abdominal aortic aneurysm, without rupture: Secondary | ICD-10-CM | POA: Diagnosis not present

## 2018-04-30 DIAGNOSIS — I429 Cardiomyopathy, unspecified: Secondary | ICD-10-CM | POA: Insufficient documentation

## 2018-04-30 MED ORDER — METOPROLOL SUCCINATE ER 25 MG PO TB24: ORAL_TABLET | ORAL | 3 refills | Status: DC

## 2018-04-30 NOTE — Progress Notes (Signed)
HF MD: [facility]  HPI:  Adrian Lambert is a 77 y.o. male with a history of severe AS s/p St Jude mechanical AVR in 03/2003 on chronic coumadin, HTN, obesity, and mild LV dysfxn EF 40-45% on 01/2016 echo. Prior to admit in 1/18, he had a cough and dyspnea that was thought to be bronchitis so he was treated with abx and oral steroids. He was seen in cardiology office in 1/18 and was noted to be in rapid atrial fibrillation with significant volume overload.   Admitted 1/23 through 08/29/16 with marked volume overload, lower extremity edema, and atrial fibrillation with RVR.  Initial cardioversion failed and he was difficult to diurese. He was placed on milrinone for low output and diuresed with IV lasix with good response.  Weight came down considerably. Amiodarone was begun. Once fully diuresed, he had successful DC-CV on 08/28/16. Discharged on torsemide 60 mg daily. Discharge weight 212 pounds. Echo and TEE in 1/18 had shown fall in EF to around 25%.   He continued to lose weight as an outpatient.  He had a syncopal episode where he stood up from sitting and got lightheaded with transient loss of consciousness.  He had been having orthostatic symptoms.  We saw him in the office and cut back on torsemide from 60 mg daily down eventually to 20 mg daily.  Creatinine up some to 1.7 at that time.    At a prior appt, he was back in atrial fibrillation.  He was set up for DCCV in 3/18, but was in NSR when he arrived to short stay. He had atrial fibrillation ablation with Dr. Curt Bears in 5/18.   Echo was done in 5/18, EF up to 45% with mild diffuse hypokinesis, normal mechanical aortic valve, mildly decreased RV systolic function.    Echo was repeated in 5/19, EF 35-40%, diffuse hypokinesis, mildly decreased RV systolic function, stable mechanical aortic valve.   He returns for pharmacist-led HF medication titration. At last HF clinic visit on 9/16, his losartan was increased to 25 mg daily. Weight is up today in  clinic but stable at home between 181-184 lb.  He takes torsemide around twice a month.  No dyspnea with his normal activities. Able to walk his [place] Shepherd/Golden Retriever mix for 1/2 mile daily.  No chest pain.  No orthopnea/PND.      Marland Kitchen Shortness of breath/dyspnea on exertion? no  . Orthopnea/PND? no . Edema? no . Lightheadedness/dizziness? No - sometimes feels "unbalanced" when stands up too quickly  . Daily weights at home? Yes - stable 181-184 lb . Blood pressure/heart rate monitoring at home? yes . Following low-sodium/fluid-restricted diet? yes  HF Medications: Losartan 25 mg PO QHS Metoprolol succinate 12.5 mg PO BID Spironolactone 25 mg PO daily Torsemide 20 mg PO daily PRN weight gain/SOB  Has the patient been experiencing any side effects to the medications prescribed?  no  Does the patient have any problems obtaining medications due to transportation or finances?   No - [place] Part [place]  Understanding of regimen: good Understanding of indications: good Potential of compliance: good Patient understands to avoid NSAIDs. Patient understands to avoid decongestants.    Pertinent Lab Values: . 04/18/18: Serum creatinine 0.91, BUN 18, Potassium 4.2, Sodium 141  Vital Signs: . Weight: 191 lb (dry weight: 186 lb) . Blood pressure: 118/78 mmHg  . Heart rate: 67 bpm   Assessment: 1. 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 NYHA class II symptoms.  - Volume status  stable despite slight weight increase since last visit - home weight stable and no s/s of volume overload today  - Increase metoprolol succinate to 12.5 mg QAM and 25 mg QPM - cautious uptitration with previous dizziness on 25 mg BID  - Continue losartan 25 mg QHS, spironolactone 25 mg daily and torsemide 20 mg daily PRN weight gain/SOB  - Basic disease state pathophysiology, medication indication, mechanism and side effects reviewed at length with patient and he verbalized  understanding  2. Mechanical aortic valve: Continue warfarin and ASA 81. Valve looked ok on 5/19 echo.  3. Atrial fibrillation: Admitted 1/18 with atrial fibrillation with RVR and CHF. EF down to 20-25%.  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 Need to keep himin NSR. DCCV during 2/18 admission and now s/p atrial fibrillation ablation.   -  He is now off amiodarone - Continue warfarin  4. Ascending aortic aneurysm: 4.5 cm on 4/19 MRA chest.  Repeat in 1 year.     Plan: 1) Medication changes: Based on clinical presentation, vital signs and recent labs will increase metoprolol succinate to 12.5 mg QAM and 25 mg QPM 2) Labs: PRN 3) Follow-up: Pharmacy visit on 11/6 and Dr. Aundra Dubin on 07/09/18   Adrian Lambert, PharmD, BCPS, CPP Clinical Pharmacist Phone: [phone] 04/30/2018 8:58 AM

## 2018-04-30 NOTE — Patient Instructions (Signed)
It was great to meet you today!  Please INCREASE your metoprolol succinate to 1/2 tablet (12.5 mg) in the morning and 1 tablet (25 mg) in the evening.   Please keep your appointment with Dr. Aundra Dubin on 07/09/18.

## 2018-05-01 DIAGNOSIS — Z952 Presence of prosthetic heart valve: Secondary | ICD-10-CM | POA: Diagnosis not present

## 2018-05-01 DIAGNOSIS — Z7901 Long term (current) use of anticoagulants: Secondary | ICD-10-CM | POA: Diagnosis not present

## 2018-05-24 DIAGNOSIS — Z7901 Long term (current) use of anticoagulants: Secondary | ICD-10-CM | POA: Diagnosis not present

## 2018-05-24 DIAGNOSIS — J0100 Acute maxillary sinusitis, unspecified: Secondary | ICD-10-CM | POA: Diagnosis not present

## 2018-05-26 ENCOUNTER — Other Ambulatory Visit: Payer: Self-pay | Admitting: Family

## 2018-05-26 DIAGNOSIS — D500 Iron deficiency anemia secondary to blood loss (chronic): Secondary | ICD-10-CM

## 2018-05-29 ENCOUNTER — Ambulatory Visit (HOSPITAL_COMMUNITY)
Admission: RE | Admit: 2018-05-29 | Discharge: 2018-05-29 | Disposition: A | Discharge status: Home / Self Care | Payer: Medicare Other | Source: Ambulatory Visit | Attending: Cardiology | Admitting: Cardiology

## 2018-05-29 DIAGNOSIS — I4891 Unspecified atrial fibrillation: Secondary | ICD-10-CM | POA: Diagnosis not present

## 2018-05-29 DIAGNOSIS — Z79899 Other long term (current) drug therapy: Secondary | ICD-10-CM | POA: Insufficient documentation

## 2018-05-29 DIAGNOSIS — I110 Hypertensive heart disease with heart failure: Secondary | ICD-10-CM | POA: Diagnosis not present

## 2018-05-29 DIAGNOSIS — I5022 Chronic systolic (congestive) heart failure: Secondary | ICD-10-CM | POA: Insufficient documentation

## 2018-05-29 DIAGNOSIS — Z7901 Long term (current) use of anticoagulants: Secondary | ICD-10-CM | POA: Diagnosis not present

## 2018-05-29 DIAGNOSIS — Z952 Presence of prosthetic heart valve: Secondary | ICD-10-CM | POA: Diagnosis not present

## 2018-05-29 DIAGNOSIS — E669 Obesity, unspecified: Secondary | ICD-10-CM | POA: Insufficient documentation

## 2018-05-29 DIAGNOSIS — I714 Abdominal aortic aneurysm, without rupture: Secondary | ICD-10-CM | POA: Insufficient documentation

## 2018-05-29 MED ORDER — METOPROLOL SUCCINATE ER 25 MG PO TB24: 25.0000 mg | ORAL_TABLET | Freq: Two times a day (BID) | ORAL | 3 refills | Status: DC

## 2018-05-29 NOTE — Patient Instructions (Signed)
INCREASE metoprolol to 25 mg (1 TABLET) twice daily You are scheduled to see the pharmacist again on 11/21 at 9:00 am

## 2018-05-29 NOTE — Progress Notes (Signed)
HF MD: [facility]  HPI:  Mr Blatt is a76 y.o.male with a history of severe AS s/p St Jude mechanical AVR in 03/2003 on chronic coumadin, HTN, obesity, and mild LV dysfxn EF 40-45% on 01/2016 echo. Prior to admit in 1/18, he had a cough and dyspnea that was thought to be bronchitis so he was treated with abx and oral steroids. He was seen in cardiology office in 1/18 and was noted to be in rapid atrial fibrillation with significant volume overload.   Admitted 1/23 through 08/29/16 with marked volume overload, lower extremity edema, and atrial fibrillation with RVR. Initial cardioversion failed and he was difficult to diurese. He was placed on milrinone for low output and diuresed with IV lasix with good response. Weight came down considerably. Amiodarone was begun. Once fully diuresed, he had successful DC-CV on 08/28/16. Discharged on torsemide 60 mg daily. Discharge weight 212 pounds. Echo and TEE in 1/18 had shown fall in EF to around 25%.   He continued to lose weight as an outpatient. He had a syncopal episode where he stood up from sitting and got lightheaded with transient loss of consciousness. He had been having orthostatic symptoms. We saw him in the office and cut back on torsemide from 60 mg daily down eventually to 20 mg daily. Creatinine up some to 1.7 at that time.   At a prior appt, he was back in atrial fibrillation. He was set up for DCCV in 3/18, but was in NSR when he arrived to short stay. He had atrial fibrillation ablation with Dr. Curt Bears in 5/18.   Echo was done in 5/18, EF up to 45% with mild diffuse hypokinesis, normal mechanical aortic valve, mildly decreased RV systolic function.   Echo was repeatedin 5/19,EF 35-40%, diffuse hypokinesis, mildly decreased RV systolic function, stable mechanical aortic valve.   He returns forpharmacist-led HF medication titration. At last HF clinic visit on 10/8, his metoprolol succinate was increased to 12.5 mg every morning  and 25 mg every evening. He was started on Augmentin 875/125 mg BID x 10 days (starting 05/24/18) and guaifenesin. He has taken his torsemide twice in the past week.   Shortness of breath/dyspnea on exertion? no   Orthopnea/PND? no  Edema? no  Lightheadedness/dizziness? no   Daily weights at home? Yes (184-187 lbs)  Blood pressure/heart rate monitoring at home? Yes (104/61-118/76 mmHg; 69-85 bpm)  Following low-sodium/fluid-restricted diet? yes  HF Medications: Losartan 25 mg PO QHS Metoprolol succinate 12.5 mg every morning and 25 mg every evening Spironolactone 25 mg PO daily Torsemide 20 mg PO daily PRN weight gain/SOB  Has the patient been experiencing any side effects to the medications prescribed?  no  Does the patient have any problems obtaining medications due to transportation or finances?   no - BCBS Part D  Understanding of regimen: good Understanding of indications: good Potential of compliance: good Patient understands to avoid NSAIDs. Patient understands to avoid decongestants.   Pertinent Lab Values:  04/18/18: Serum creatinine 0.91, BUN 18, Potassium 4.2, Sodium 141  Vital Signs:  Weight: 192 lbs (dry weight: 186 lbs)  Blood pressure: 116/74 mmHg  Heart rate: 75 bpm  Oxygen: 97%  Assessment: 1. Chronicsystolic CHF (UM35-36>14>43-15%), due toNICM (?tachy mediated). NYHA classIIsymptoms. - Volume status and weight stable since last visit - home weight relatively stable and no s/s of volume overload today - Increase metoprolol succinate to 25 mg BID - cautious uptitration with previous dizziness on 25 mg BID - Continue losartan 25 mg  QHS, spironolactone 25 mg daily and torsemide 20 mg daily PRN weight gain/SOB - Basic disease state pathophysiology, medication indication, mechanism and side effects reviewed at length with patient and he verbalized understanding  2. Mechanical aortic valve:Continue warfarin and ASA 81. Valve looked  ok on 5/19 echo.  3. Atrial fibrillation:Admitted 1/18 with atrial fibrillation with RVR and CHF. EF down to 20-25%. 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 Need to keep himin NSR. DCCV during 2/18 admission and now s/p atrial fibrillation ablation.  - He is now off amiodarone - Continue warfarin  4. Ascending aortic aneurysm:4.5 cm on 4/19 MRA chest. Repeat in 1 year.  Plan: 1) Medication changes: Based on clinical presentation, vital signs and recent labs will increase metoprolol to 25 mg BID 2) Labs: PRN 3) Follow-up: Pharmacy visit on 11/21 and Dr. Aundra Dubin on 07/09/18  Ruta Hinds. Velva Harman, PharmD, BCPS, CPP Clinical Pharmacist Phone: [phone] 05/28/2018 2:16 PM

## 2018-05-30 ENCOUNTER — Other Ambulatory Visit: Payer: Self-pay | Admitting: *Deleted

## 2018-05-30 DIAGNOSIS — D500 Iron deficiency anemia secondary to blood loss (chronic): Secondary | ICD-10-CM

## 2018-05-30 MED ORDER — FOLIC ACID 1 MG PO TABS: ORAL_TABLET | ORAL | 3 refills | Status: DC

## 2018-06-05 DIAGNOSIS — M25561 Pain in right knee: Secondary | ICD-10-CM | POA: Diagnosis not present

## 2018-06-10 DIAGNOSIS — M25562 Pain in left knee: Secondary | ICD-10-CM | POA: Diagnosis not present

## 2018-06-12 DIAGNOSIS — M25561 Pain in right knee: Secondary | ICD-10-CM | POA: Diagnosis not present

## 2018-06-12 DIAGNOSIS — M25562 Pain in left knee: Secondary | ICD-10-CM | POA: Diagnosis not present

## 2018-06-13 ENCOUNTER — Ambulatory Visit (HOSPITAL_COMMUNITY)
Admission: RE | Admit: 2018-06-13 | Discharge: 2018-06-13 | Disposition: A | Discharge status: Home / Self Care | Payer: Medicare Other | Source: Ambulatory Visit | Attending: Internal Medicine | Admitting: Internal Medicine

## 2018-06-13 DIAGNOSIS — I110 Hypertensive heart disease with heart failure: Secondary | ICD-10-CM | POA: Diagnosis not present

## 2018-06-13 DIAGNOSIS — I4891 Unspecified atrial fibrillation: Secondary | ICD-10-CM | POA: Diagnosis not present

## 2018-06-13 DIAGNOSIS — I5022 Chronic systolic (congestive) heart failure: Secondary | ICD-10-CM | POA: Insufficient documentation

## 2018-06-13 DIAGNOSIS — I712 Thoracic aortic aneurysm, without rupture: Secondary | ICD-10-CM | POA: Diagnosis not present

## 2018-06-13 DIAGNOSIS — Z7901 Long term (current) use of anticoagulants: Secondary | ICD-10-CM | POA: Insufficient documentation

## 2018-06-13 NOTE — Progress Notes (Signed)
HF MD: [facility]  HPI:  Adrian Lambert is a62 y.o.male with a history of severe AS s/p St Jude mechanical AVR in 03/2003 on chronic coumadin, HTN, obesity, and mild LV dysfxn EF 40-45% on 01/2016 echo. Prior to admit in 1/18, he had a cough and dyspnea that was thought to be bronchitis so he was treated with abx and oral steroids. He was seen in cardiology office in 1/18 and was noted to be in rapid atrial fibrillation with significant volume overload.   Admitted 1/23 through 08/29/16 with marked volume overload, lower extremity edema, and atrial fibrillation with RVR. Initial cardioversion failed and he was difficult to diurese. He was placed on milrinone for low output and diuresed with IV lasix with good response. Weight came down considerably. Amiodarone was begun. Once fully diuresed, he had successful DC-CV on 08/28/16. Discharged on torsemide 60 mg daily. Discharge weight 212 pounds. Echo and TEE in 1/18 had shown fall in EF to around 25%.   He continued to lose weight as an outpatient. He had a syncopal episode where he stood up from sitting and got lightheaded with transient loss of consciousness. He had been having orthostatic symptoms. We saw him in the office and cut back on torsemide from 60 mg daily down eventually to 20 mg daily. Creatinine up some to 1.7 at that time.   At a prior appt, he was back in atrial fibrillation. He was set up for DCCV in 3/18, but was in NSR when he arrived to short stay. He had atrial fibrillation ablation with Dr. Curt Bears in 5/18.   Echo was done in 5/18, EF up to 45% with mild diffuse hypokinesis, normal mechanical aortic valve, mildly decreased RV systolic function.   Echo was repeatedin 5/19,EF 35-40%, diffuse hypokinesis, mildly decreased RV systolic function, stable mechanical aortic valve.   He returns forpharmacist-led HF medication titration. At last pharmacist-led HF clinic visit on11/6, his metoprolol was cautiously increased to 25 mg  BID with previous dizziness on this dose. He reports no dizziness or lightheadedness since this dose was increased.  He took 1 tablet of torsemide on 11/15 for weight gain of 2 lbs. He states he was on vacation last week and had been eating more meat and potatoes than his usual diet. He states that he is hesitant to make any additional changes to his medications at this time because of the holidays coming up.   Shortness of breath/dyspnea on exertion? no (n)  Orthopnea/PND? no (n)  Edema? yes (n) - bilateral LE  Lightheadedness/dizziness? no (n)  Daily weights at home? yes (y) - 184-188 lbs at home  Blood pressure/heart rate monitoring at home? yes (y) - 110-120/70s; 31s  Following low-sodium/fluid-restricted diet? no (n)  HF Medications: Losartan 25 mg PO QHS Metoprolol succinate 25 mg BID Spironolactone 25 mg PO daily Torsemide 20 mg PO daily PRN weight gain/SOB  Has the patient been experiencing any side effects to the medications prescribed?  no  Does the patient have any problems obtaining medications due to transportation or finances?   No - BCBS Part D  Understanding of regimen: good Understanding of indications: good Potential of compliance: good Patient understands to avoid NSAIDs. Patient understands to avoid decongestants.   Pertinent Lab Values:  04/18/18: Serum creatinine 0.91, BUN 18, Potassium 4.2, Sodium 141  Vital Signs:  Weight: 196 lb (dry weight: 186 lb) - weight this morning at home per patient was 188 lbs  Blood pressure: 118/72 mmHg  Heart rate: 74 bpm  ReDS = 36%  Assessment: 1. 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 NYHA class IIsymptoms.  - Slightly volume overloaded today with LE edema and ReDS = 36%   - Increase torsemide to 20 mg today and tomorrow, then resume taking 20 mg daily PRN weight gain/SOB  - Recommended to either start Entresto 24/26 mg BID or increase losartan to 50 mg daily.   -  Explained the benefit of Entresto in patients with heart failure but patient stated he would like to wait until after the holidays to start Entresto.   - Explained that increasing the losartan to 50 mg daily would have a greater blood pressure effect and could make the transition to [facility] in the future easier to tolerate.   - Patient refused to make any permanent changes to his blood pressure/HF medications at this time, but agreed to take torsemide today and tomorrow. He also states he would like to follow up with Dr. Aundra Dubin prior to making any additional changes.  - Continue taking metoprolol succinate 25 mg BID, losartan 25 mg QHS, and spironolactone 25 daily - Basic disease state pathophysiology, medication indication, mechanism and side effects reviewed at length with patient and he verbalized understanding  2. Mechanical aortic valve:Continue warfarin and ASA 81. Valve looked ok on 5/19 echo.  3. Atrial fibrillation:Admitted 1/18 with atrial fibrillation with RVR and CHF. EF down to 20-25%. 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 Need to keep himin NSR. DCCV during 2/18 admission and now s/p atrial fibrillation ablation.  - He is now off amiodarone - Continue warfarin  4. Ascending aortic aneurysm:4.5 cm on 4/19 MRA chest. Repeat in 1 year.  Plan: 1) Medication changes: Based on clinical presentation, vital signs and recent labs will increase torsemide to 1 tablet today and tomorrow and then resume normal regimen of PRN daily for weight gain/SOB. Consider transitioning to [facility] or increasing losartan to 50 mg daily on next visit   2) Labs: PRN 3) Follow-up: Dr. Aundra Dubin 07/09/18  Ruta Hinds. Velva Harman, PharmD, BCPS, CPP Clinical Pharmacist Phone: [phone] 06/12/2018 1:44 PM

## 2018-06-13 NOTE — Patient Instructions (Signed)
It was nice to see you today!  Please take 1 tablet of TORSEMIDE TODAY and 1 tablet of TORSEMIDE TOMORROW.  Please keep your appointment with Dr. Aundra Dubin on 12/17.  You can call Doroteo Bradford, the pharmacist, with any questions or concerns.

## 2018-06-27 DIAGNOSIS — Z7901 Long term (current) use of anticoagulants: Secondary | ICD-10-CM | POA: Diagnosis not present

## 2018-06-28 ENCOUNTER — Other Ambulatory Visit (HOSPITAL_COMMUNITY): Payer: Self-pay | Admitting: Cardiology

## 2018-07-09 ENCOUNTER — Ambulatory Visit (HOSPITAL_COMMUNITY)
Admission: RE | Admit: 2018-07-09 | Discharge: 2018-07-09 | Disposition: A | Discharge status: Home / Self Care | Payer: Medicare Other | Source: Ambulatory Visit | Attending: Cardiology | Admitting: Cardiology

## 2018-07-09 ENCOUNTER — Encounter (HOSPITAL_COMMUNITY): Payer: Self-pay | Admitting: Cardiology

## 2018-07-09 VITALS — BP 114/70 | HR 71 | Wt 195.0 lb

## 2018-07-09 DIAGNOSIS — I440 Atrioventricular block, first degree: Secondary | ICD-10-CM | POA: Diagnosis not present

## 2018-07-09 DIAGNOSIS — Z7982 Long term (current) use of aspirin: Secondary | ICD-10-CM | POA: Diagnosis not present

## 2018-07-09 DIAGNOSIS — I4510 Unspecified right bundle-branch block: Secondary | ICD-10-CM | POA: Diagnosis not present

## 2018-07-09 DIAGNOSIS — I712 Thoracic aortic aneurysm, without rupture, unspecified: Secondary | ICD-10-CM

## 2018-07-09 DIAGNOSIS — I110 Hypertensive heart disease with heart failure: Secondary | ICD-10-CM | POA: Diagnosis not present

## 2018-07-09 DIAGNOSIS — Z7901 Long term (current) use of anticoagulants: Secondary | ICD-10-CM | POA: Diagnosis not present

## 2018-07-09 DIAGNOSIS — Z79899 Other long term (current) drug therapy: Secondary | ICD-10-CM | POA: Insufficient documentation

## 2018-07-09 DIAGNOSIS — Z952 Presence of prosthetic heart valve: Secondary | ICD-10-CM | POA: Diagnosis not present

## 2018-07-09 DIAGNOSIS — I480 Paroxysmal atrial fibrillation: Secondary | ICD-10-CM

## 2018-07-09 DIAGNOSIS — I5022 Chronic systolic (congestive) heart failure: Secondary | ICD-10-CM | POA: Diagnosis not present

## 2018-07-09 DIAGNOSIS — Z9889 Other specified postprocedural states: Secondary | ICD-10-CM | POA: Diagnosis not present

## 2018-07-09 DIAGNOSIS — I4891 Unspecified atrial fibrillation: Secondary | ICD-10-CM | POA: Insufficient documentation

## 2018-07-09 DIAGNOSIS — E669 Obesity, unspecified: Secondary | ICD-10-CM | POA: Insufficient documentation

## 2018-07-09 DIAGNOSIS — I5042 Chronic combined systolic (congestive) and diastolic (congestive) heart failure: Secondary | ICD-10-CM

## 2018-07-09 LAB — CBC
HEMATOCRIT: 41.8 % (ref 39.0–52.0)
HEMOGLOBIN: 12.5 g/dL — AB (ref 13.0–17.0)
MCH: 19.3 pg — AB (ref 26.0–34.0)
MCHC: 29.9 g/dL — ABNORMAL LOW (ref 30.0–36.0)
MCV: 64.6 fL — AB (ref 80.0–100.0)
NRBC: 0.2 % (ref 0.0–0.2)
Platelets: 150 10*3/uL (ref 150–400)
RBC: 6.47 MIL/uL — AB (ref 4.22–5.81)
RDW: 18.3 % — ABNORMAL HIGH (ref 11.5–15.5)
WBC: 9 10*3/uL (ref 4.0–10.5)

## 2018-07-09 LAB — BASIC METABOLIC PANEL
ANION GAP: 10 (ref 5–15)
BUN: 21 mg/dL (ref 8–23)
CHLORIDE: 103 mmol/L (ref 98–111)
CO2: 25 mmol/L (ref 22–32)
Calcium: 9.1 mg/dL (ref 8.9–10.3)
Creatinine, Ser: 1.04 mg/dL (ref 0.61–1.24)
GFR calc non Af Amer: >60 mL/min (ref >60)
GLUCOSE: 109 mg/dL — AB (ref 70–99)
POTASSIUM: 4.6 mmol/L (ref 3.5–5.1)
Sodium: 138 mmol/L (ref 135–145)

## 2018-07-09 MED ORDER — SACUBITRIL-VALSARTAN 24-26 MG PO TABS: 1.0000 | ORAL_TABLET | Freq: Two times a day (BID) | ORAL | 6 refills | Status: DC

## 2018-07-09 NOTE — Progress Notes (Signed)
PCP: Dr Rex Kras - Also manages INR Cardiology: Dr. Martinique  HF Cardiology: Dr Aundra Dubin   HPI: Adrian Lambert is a 77 y.o. male with a history of severe AS s/p St Jude mechanical AVR in 03/2003 on chronic coumadin, HTN, obesity, and mild LV dysfxn EF 40-45% on 01/2016 echo. Prior to admit in 1/18, he had a cough and dyspnea that was thought to be bronchitis so he was treated with abx and oral steroids. He was seen in cardiology office in 1/18 and was noted to be in rapid atrial fibrillation with significant volume overload.   Admitted 1/23 through 08/29/16 with marked volume overload, lower extremity edema, and atrial fibrillation with RVR.  Initial cardioversion failed and he was difficult to diurese. He was placed on milrinone for low output and diuresed with IV lasix with good response.  Weight came down considerably. Amiodarone was begun. Once fully diuresed, he had successful DC-CV on 08/28/16. Discharged on torsemide 60 mg daily. Discharge weight 212 pounds. Echo and TEE in 1/18 had shown fall in EF to around 25%.   He continued to lose weight as an outpatient.  He had a syncopal episode where he stood up from sitting and got lightheaded with transient loss of consciousness.  He had been having orthostatic symptoms.  We saw him in the office and cut back on torsemide from 60 mg daily down eventually to 20 mg daily.  Creatinine up some to 1.7 at that time.    At a prior appt, he was back in atrial fibrillation.  He was set up for DCCV in 3/18, but was in NSR when he arrived to short stay. He had atrial fibrillation ablation with Dr. Curt Bears in 5/18.   Echo was done in 5/18, EF up to 45% with mild diffuse hypokinesis, normal mechanical aortic valve, mildly decreased RV systolic function.    Echo was repeated in 5/19, EF 35-40%, diffuse hypokinesis, mildly decreased RV systolic function, stable mechanical aortic valve.   He returns for followup of CHF.  Weight is up about 4 lbs.  No chest pain.  No dyspnea  walking up a flight of stairs or walking on flat ground.  He walks about a mile a day.  No palpitations.  No BRBPR/melena.  He takes torsemide about three times/month.  No lightheadedness.   ECG (personally reviewed): NSR, iRBBB, poor RWP  Labs (09/04/2016):  Hgb 15.9, K 5.0, Creatinine 1.77, digoxin 0.7, BNP 326  Labs (3/18): digoxin 0.8, K 4.8, creatinine 1.44, TSH normal, AST 54, ALT 49 Labs (4/18): K 4.3, creatinine 1.3 Labs (7/18): K 3.8, creatinine 0.9, hgb 11.9 Labs (11/18): K 4.9, creatinine 1.0, hgb 13.5 Labs (3/19): K 4.7,creatinine 0.99 Labs (5/19): K 4.6, creatinine 0.98 Labs (9/19): K 4.2, creatinine 0.91  PMH: 1. Aortic stenosis: # 25 St Jude mechanical aortic valve in 9/14.   2. Chronic systolic CHF: Echo in 1/44 with EF 45-50%.  Admitted in 1/18 with afib with RVR, ?tachycardia-mediated cardiomyopathy.  - Echo (1/18) with EF 25-30%, stable mechanical aortic valve.  - TEE (1/18) with EF 20-25%, stable mechanical aortic valve, moderate Adrian, small PFO.  - Admission 1/18 with acute on chronic systolic CHF, required milrinone, extensively diuresed.  - Echo (5/18) with EF 45%, diffuse hypokinesis, normal RV size with mildly decreased systolic function, mechanical aortic valve with mean gradient 14 mmHg, ascending aorta 4.4 cm.  - Echo (5/19) with EF 35-40%, diffuse hypokinesis, mild LVH, mildly decreased RV systolic function, mechanical aortic valve looked ok.  3. Atrial fibrillation: First noted in 1/61, uncertain how long it had been present (with RVR).  08/28/2016 Successful DC-CV --> NSR.  Back in atrial fibrillation at 09/18/16 office visit.  Set up for DCCV in 3/18 but back in NSR.   - Atrial fibrillation ablation in 5/18. Now off amiodarone.  4. 09/04/2016 Syncope--> over-diuresis most likely.  5. HTN 6. Ascending aortic aneurysm: 4.4 cm on 7/17 echo, 4.4 cm on 5/18 echo.  - MRA chest (4/19): 4.5 cm ascending aortic aneurysm.  7. Prostate cancer: Treated with radioactive seed  implantation.  8. Nephrolithiasis.  9. PFTs (4/18): Mild restriction.   ROS: All systems negative except as listed in HPI, PMH and Problem List.  SH:  Social History   Socioeconomic History  . Marital status: Married    Spouse name: Not on file  . Number of children: 4  . Years of education: Not on file  . Highest education level: Not on file  Occupational History  . 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 Financial resource strain: Not on file  . Food insecurity:    Worry: Not on file    Inability: Not on file  . Transportation needs:    Medical: Not on file    Non-medical: Not on file  Tobacco Use  . Smoking status: Never Smoker  . Smokeless tobacco: Never Used  Substance and Sexual Activity  . Alcohol use: Yes    Alcohol/week: 3.0 standard drinks    Types: 3 Cans of beer per week    Comment:  "may have 2-3 beers on the weekend"  . Drug use: No  . Sexual activity: Not on file  Lifestyle  . Physical activity:    Days per week: Not on file    Minutes per session: Not on file  . Stress: Not on file  Relationships  . Social connections:    Talks on phone: Not on file    Gets together: Not on file    Attends religious service: Not on file    Active member of club or organization: Not on file    Attends meetings of clubs or organizations: Not on file    Relationship status: Not on file  . Intimate partner violence:    Fear of current or ex partner: Not on file    Emotionally abused: Not on file    Physically abused: Not on file    Forced sexual activity: Not on file  Other Topics Concern  . Not on file  Social History Narrative  . Not on file    FH:  Family History  Problem Relation Age of Onset  . Heart failure Mother   . Diabetes Mother   . Prostate cancer Father     Current Outpatient Medications  Medication Sig Dispense Refill  . allopurinol (ZYLOPRIM) 300 MG tablet Take 300 mg by mouth daily.     Marland Kitchen aspirin 81 MG chewable  tablet Chew 1 tablet (81 mg total) by mouth daily. 30 tablet 6  . docusate sodium (COLACE) 100 MG capsule Take 100 mg by mouth 2 (two) times daily.     . folic acid (FOLVITE) 1 MG tablet TAKE 1 TABLET(1 MG) BY MOUTH DAILY 30 tablet 0  . metoprolol succinate (TOPROL-XL) 25 MG 24 hr tablet Take 1 tablet (25 mg total) by mouth 2 (two) times daily. 60 tablet 3  . Psyllium (METAMUCIL PO) Take 2 capsules by mouth 2 (two) times daily.     Marland Kitchen  tamsulosin (FLOMAX) 0.4 MG CAPS capsule Take 0.4 mg by mouth every other day.     . tolterodine (DETROL LA) 4 MG 24 hr capsule Take 4 mg by mouth every other day.   0  . warfarin (COUMADIN) 5 MG tablet Take 5 mg by mouth as directed. 1/2 Tablet(2.5mg ) Monday, Wednesday, Friday. 1 tablet( 5mg )Tuesday, Thursday, Saturday, Sunday.    . colchicine 0.6 MG tablet Take 0.6 mg by mouth daily as needed (for gout).     Derrill Memo ON 07/10/2018] sacubitril-valsartan (ENTRESTO) 24-26 MG Take 1 tablet by mouth 2 (two) times daily. 60 tablet 6  . spironolactone (ALDACTONE) 25 MG tablet Take 1 tablet (25 mg total) by mouth daily as needed. (Patient not taking: Reported on 07/09/2018) 90 tablet 0  . torsemide (DEMADEX) 20 MG tablet Take 1 tablet by mouth daily as needed.  3   No current facility-administered medications for this encounter.     Vitals:   07/09/18 1011  BP: 114/70  Pulse: 71  SpO2: 97%  Weight: 88.5 kg (195 lb)   Filed Weights   07/09/18 1011  Weight: 88.5 kg (195 lb)   PHYSICAL EXAM: General: NAD Neck: No JVD, no thyromegaly or thyroid nodule.  Lungs: Clear to auscultation bilaterally with normal respiratory effort. CV: Nondisplaced PMI.  Heart regular S1/S2 with mechanical S2, no S3/S4, 1/6 SEM RUSB.  No peripheral edema.  No carotid bruit.  Normal pedal pulses.  Abdomen: Soft, nontender, no hepatosplenomegaly, no distention.  Skin: Intact without lesions or rashes.  Neurologic: Alert and oriented x 3.  Psych: Normal affect. Extremities: No clubbing or  cyanosis.  HEENT: Normal.   ASSESSMENT & PLAN: 1. Chronic systolic CHF: EF on TEE in 1/18 20-25%, mechanical aortic valve ok. 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  He required milrinone to assist diuresis during 1/18-2/18 admission.  Now that he is back in NSR s/p atrial fibrillation ablation, echo in 5/18 showed EF back up to 45%.  Echo in 5/19 showed mildly lower systolic function, EF 37-16%. Currently, NYHA class II symptoms and does not appear significantly volume overloaded.  Medication titration has been limited by lightheadedness in the past though he is tolerating current regimen.     - Continue spironolactone 25 mg daily.  - He is taking losartan 25 mg bid and tolerating it ok. I will have him stop this and start on Entresto 24/26 bid.  He will let me know if he develops significant lightheadedness with this change.  BMET today and again in 10 days.       - Continue Toprol XL 25 mg bid.  - Given no ACS/chest pain and stabilization of EF, I think we can defer coronary angiography.  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  If he has any chest pain or further fall in EF, will need angiography.  2. Mechanical aortic valve: Continue warfarin and ASA 81. Valve looked ok on 5/19 echo. 3. Atrial fibrillation: Admitted 1/18 with atrial fibrillation with RVR and CHF.  EF down to 20-25%.  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  Need to keep him in NSR. DCCV during 2/18 admission and now s/p atrial fibrillation ablation.  He is in NSR today.  -  He is now off amiodarone.   - Continue warfarin.  CBC today.   4. Ascending aortic aneurysm: 4.5 cm on 4/19  MRA chest.  Repeat in 1 year.    Followup in 3 months, will need to schedule MRA chest at that time.   Adrian Lambert 07/09/2018

## 2018-07-09 NOTE — Patient Instructions (Addendum)
DISCONTINUE Losartan  START Entresto 24/26mg  (1 tab) twice a day. Start tomorrow (07/10/18)  Labs today We will only contact you if something comes back abnormal or we need to make some changes. Otherwise no news is good news!  Repeat labs in 10 days.  Your physician recommends that you schedule a follow-up appointment in: 3 months

## 2018-07-19 ENCOUNTER — Ambulatory Visit (HOSPITAL_COMMUNITY)
Admission: RE | Admit: 2018-07-19 | Discharge: 2018-07-19 | Disposition: A | Discharge status: Home / Self Care | Payer: Medicare Other | Source: Ambulatory Visit | Attending: Internal Medicine | Admitting: Internal Medicine

## 2018-07-19 DIAGNOSIS — I5042 Chronic combined systolic (congestive) and diastolic (congestive) heart failure: Secondary | ICD-10-CM

## 2018-07-19 LAB — BASIC METABOLIC PANEL
Anion gap: 7 (ref 5–15)
BUN: 20 mg/dL (ref 8–23)
CALCIUM: 9.2 mg/dL (ref 8.9–10.3)
CO2: 28 mmol/L (ref 22–32)
CREATININE: 1.14 mg/dL (ref 0.61–1.24)
Chloride: 104 mmol/L (ref 98–111)
GFR calc Af Amer: >60 mL/min (ref >60)
Glucose, Bld: 127 mg/dL — ABNORMAL HIGH (ref 70–99)
Potassium: 4.6 mmol/L (ref 3.5–5.1)
Sodium: 139 mmol/L (ref 135–145)

## 2018-07-24 ENCOUNTER — Other Ambulatory Visit (HOSPITAL_COMMUNITY): Payer: Self-pay | Admitting: Cardiology

## 2018-07-25 DIAGNOSIS — Z952 Presence of prosthetic heart valve: Secondary | ICD-10-CM | POA: Diagnosis not present

## 2018-07-25 DIAGNOSIS — Z7901 Long term (current) use of anticoagulants: Secondary | ICD-10-CM | POA: Diagnosis not present

## 2018-08-05 ENCOUNTER — Other Ambulatory Visit (HOSPITAL_COMMUNITY): Payer: Self-pay | Admitting: *Deleted

## 2018-08-05 MED ORDER — METOPROLOL SUCCINATE ER 25 MG PO TB24: 25.0000 mg | ORAL_TABLET | Freq: Two times a day (BID) | ORAL | 3 refills | Status: DC

## 2018-08-07 DIAGNOSIS — M25561 Pain in right knee: Secondary | ICD-10-CM | POA: Diagnosis not present

## 2018-08-30 IMAGING — CT CT HEART MORPH/PULM VEIN W/ CM & W/O CA SCORE
1 of 6 series · 13 of 20 positions shown, 17 images · IV contrast (OMNI 350)
Comparison: None.

CLINICAL DATA: Atrial fibrillation scheduled for an ablation.

EXAM:
Cardiac CT/CTA
TECHNIQUE: The patient was scanned on a Siemens Somatom scanner.

[Series 11: corcta 0.6 i26f 2 40 - 80 % · axial · 0.39mm/px · z∈[+1235,+1336]mm · 13 of 2673 slices shown, 17 images]
[im 191/2673  vessel]
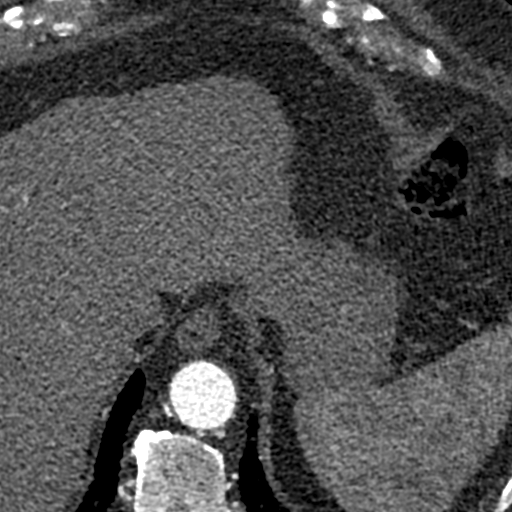
[im 191/2673  lung]
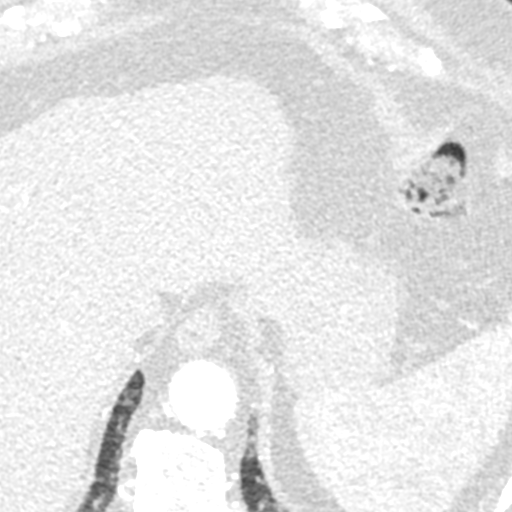
[im 382/2673  vessel]
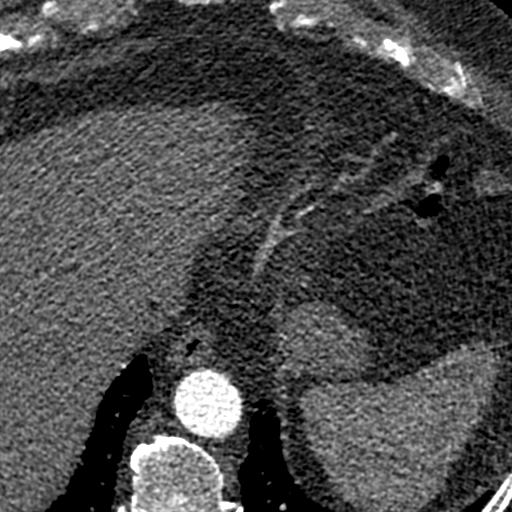
[im 573/2673  vessel]
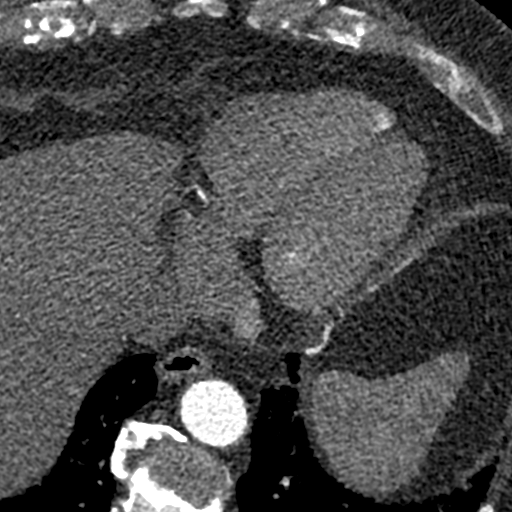
[im 764/2673  vessel]
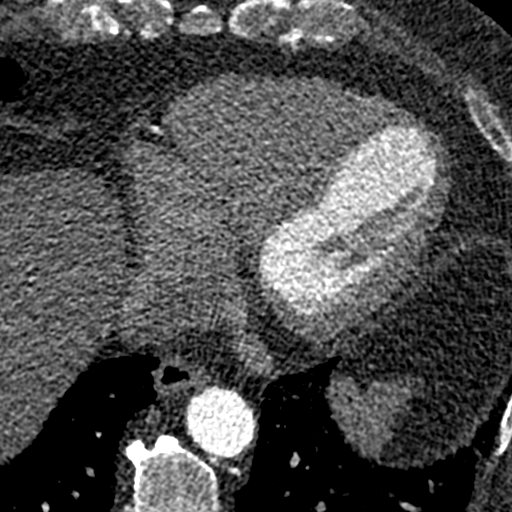
[im 955/2673  vessel]
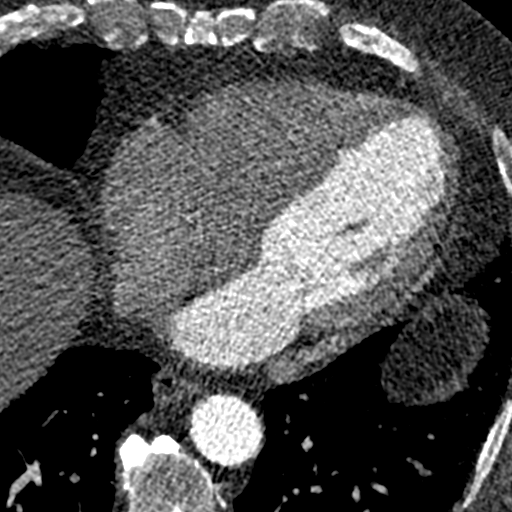
[im 955/2673  lung]
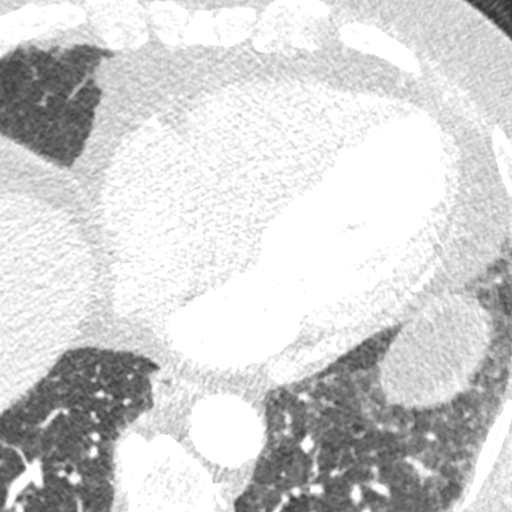
[im 1146/2673  vessel]
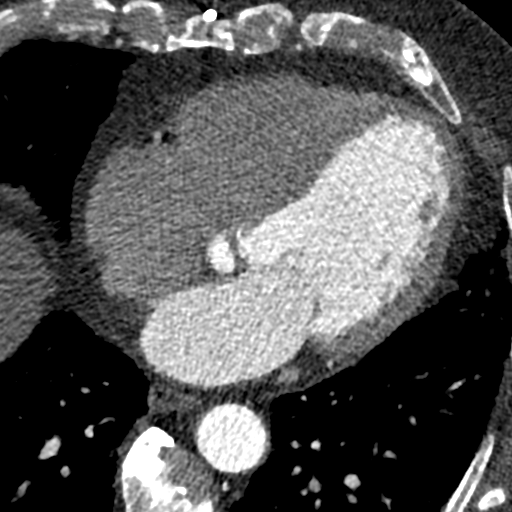
[im 1337/2673  vessel]
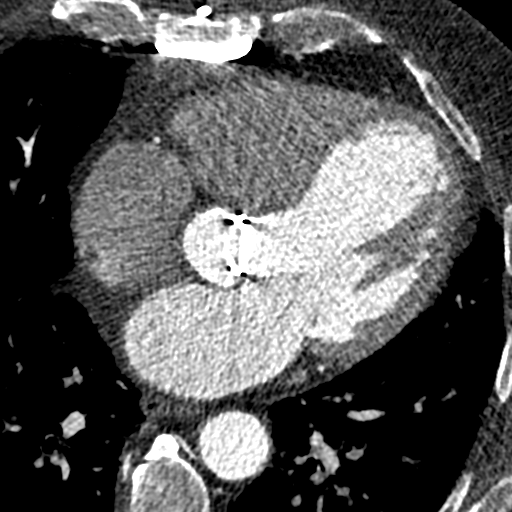
[im 1527/2673  vessel]
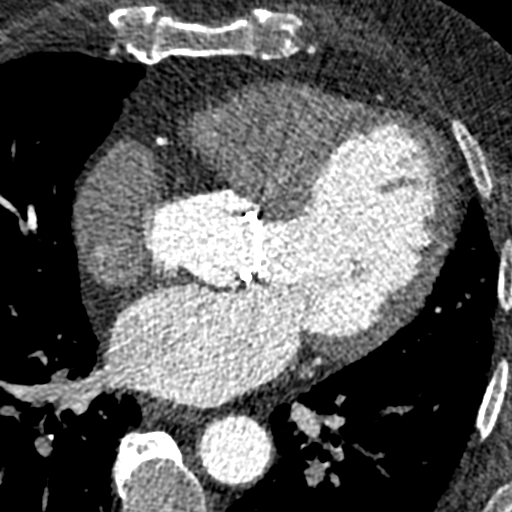
[im 1718/2673  vessel]
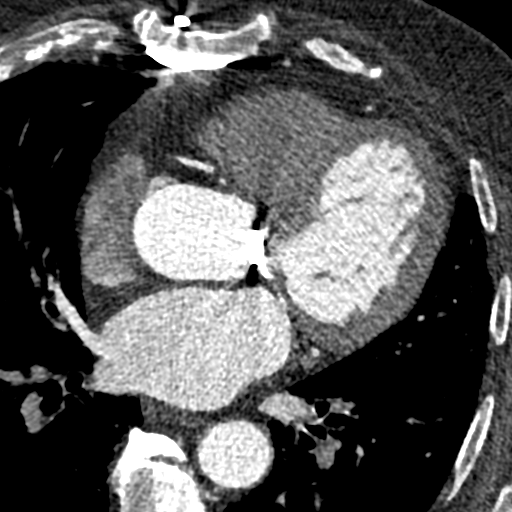
[im 1718/2673  lung]
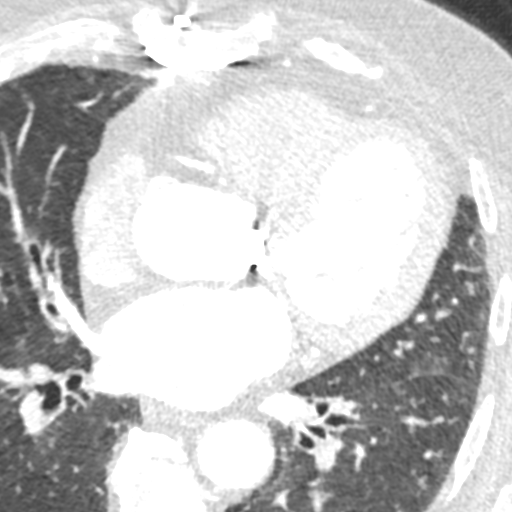
[im 1909/2673  vessel]
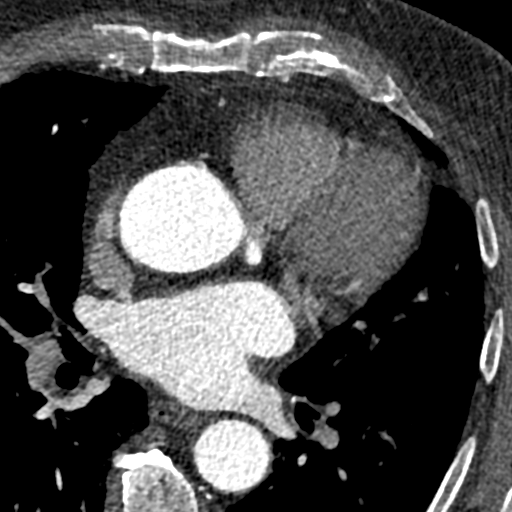
[im 2100/2673  vessel]
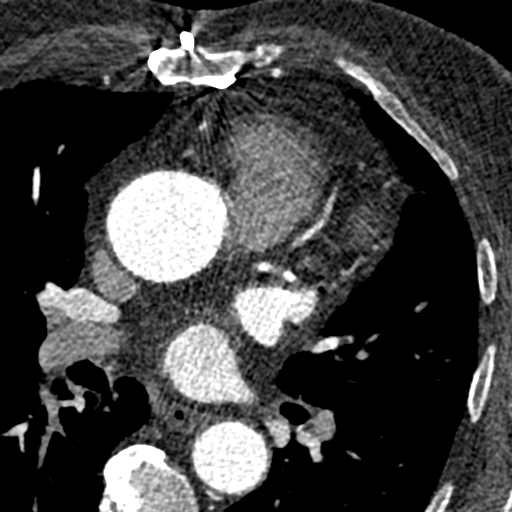
[im 2291/2673  vessel]
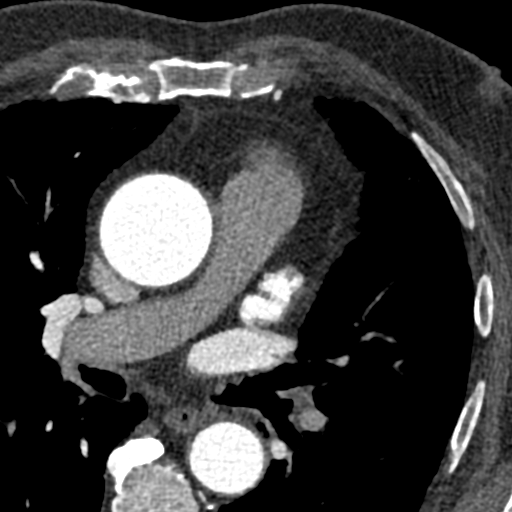
[im 2482/2673  vessel]
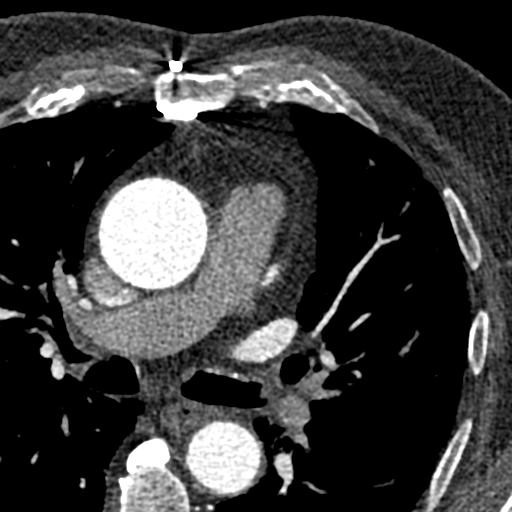
[im 2482/2673  lung]
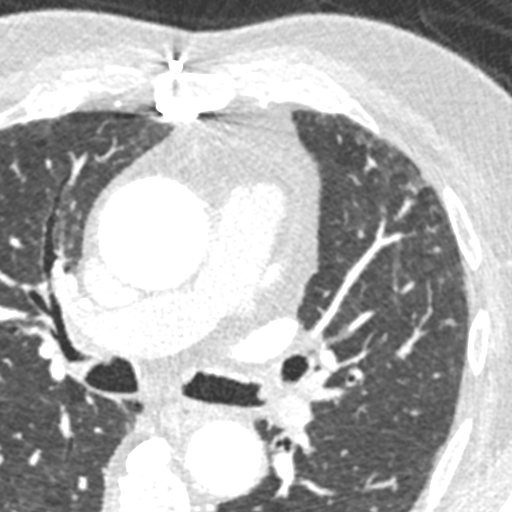

[13 of 20 positions shown; findings below may reference images not displayed]

FINDINGS: A 120 kV prospective scan was triggered in the descending thoracic
aorta at 111 HU's. Gantry rotation speed was 280 msecs and
collimation was .9 mm. No beta blockade and no NTG was given. The 3D
data set was reconstructed in 5% intervals of the 60-80 % of the R-R
cycle. Diastolic phases were analyzed on a dedicated work station
using MPR, MIP and VRT modes. The patient received 80 cc of
contrast.

There was moderate SHONNA and mild LAE. There was no CEOLA thrombus.
There was no pericardial effusion. There was a PFO present The
esophagus coursed closest to the RLPV ostium. There was a mechanical
bileaflet prosthetic aortic valve. There was moderate aortic root
dilatation 4.3 cm.

The coronary calcium score was low at 24 with mild calcium noted in
proximal RCA and LAD

There is normal pulmonary vein drainage into the left atrium (3 on
the right and 2 on the left which included a right middle pulmonary
vein) with ostial measurements as follows:

RUPV:  Ostium 14.4 mm Area 1.7 cm2

RLPV:  Ostium 16 mm Area 2.3 cm2

LUPV:  Ostium 15.5 mm Area 1.9 cm2

LLPV:  Ostium 16 mm ARea 1.7 cm2
IMPRESSION: 1) No CEOLA thrombus

2) Normal pulmonary veins measurements as above with right middle
pulmonary vein

3) PFO present

4) Moderate aortic root dilatation 4.3 cm

5) Mechanical bi leaflet aortic valve

6) Moderate SHONNA and mild LAE

7) Calcium score 24 which is 17th percentile for age and sex

8) Esophagus courses closest to the ostium of the GF

Rafasya Fashionable

EXAM:
OVER-READ INTERPRETATION  CT CHEST

The following report is an over-read performed by radiologist Dr.
Klaus Mares [[email or web address]] on 11/23/2016. This over-read
does not include interpretation of cardiac or coronary anatomy or
pathology. The coronary CTA interpretation by the cardiologist is
attached.
FINDINGS: Cardiovascular: Mild aneurysmal dilatation of the ascending thoracic
aorta measuring 4.5 cm. Prior aortic valve repair. High-density
material wraps around the aortic root just above the aortic valve.
Question prior ascending aortic tube graft. Recommend clinical
correlation. Heart is normal size.

Mediastinum/Nodes: No adenopathy in the visualized lower mediastinum
or hila.

Lungs/Pleura: Scarring in the lingula and right middle lobe. No
confluent opacities. Heart is normal size.

Upper Abdomen: Imaging into the upper abdomen shows no acute
findings.

Musculoskeletal: Chest wall soft tissues are unremarkable. No acute
bony abnormality.
IMPRESSION: Mild aneurysmal dilatation of the ascending thoracic aorta, 4.5 cm.
Question prior tube graft repair. Recommend clinical correlation. Go
Recommend semi-annual imaging followup by CTA or MRA and referral to
cardiothoracic surgery if not already obtained. This recommendation
follows 1434 ACCF/AHA/AATS/ACR/ASA/SCA/RY/ZJOGA/QUEENIE/PRSATA Guidelines
for the Diagnosis and Management of Patients With Thoracic Aortic
Disease. Circulation. 1434; 121: [id number]

## 2018-09-04 DIAGNOSIS — Z952 Presence of prosthetic heart valve: Secondary | ICD-10-CM | POA: Diagnosis not present

## 2018-09-04 DIAGNOSIS — Z7901 Long term (current) use of anticoagulants: Secondary | ICD-10-CM | POA: Diagnosis not present

## 2018-10-07 DIAGNOSIS — M25561 Pain in right knee: Secondary | ICD-10-CM | POA: Diagnosis not present

## 2018-10-08 ENCOUNTER — Encounter (HOSPITAL_COMMUNITY): Payer: Self-pay | Admitting: Cardiology

## 2018-10-08 ENCOUNTER — Other Ambulatory Visit: Payer: Self-pay

## 2018-10-08 ENCOUNTER — Ambulatory Visit (HOSPITAL_COMMUNITY)
Admission: RE | Admit: 2018-10-08 | Discharge: 2018-10-08 | Disposition: A | Discharge status: Home / Self Care | Payer: Medicare Other | Source: Ambulatory Visit | Attending: Cardiology | Admitting: Cardiology

## 2018-10-08 VITALS — BP 118/72 | HR 73 | Wt 198.0 lb

## 2018-10-08 DIAGNOSIS — Z7982 Long term (current) use of aspirin: Secondary | ICD-10-CM | POA: Insufficient documentation

## 2018-10-08 DIAGNOSIS — I5022 Chronic systolic (congestive) heart failure: Secondary | ICD-10-CM | POA: Diagnosis not present

## 2018-10-08 DIAGNOSIS — Z79899 Other long term (current) drug therapy: Secondary | ICD-10-CM | POA: Insufficient documentation

## 2018-10-08 DIAGNOSIS — I110 Hypertensive heart disease with heart failure: Secondary | ICD-10-CM | POA: Insufficient documentation

## 2018-10-08 DIAGNOSIS — I714 Abdominal aortic aneurysm, without rupture, unspecified: Secondary | ICD-10-CM

## 2018-10-08 DIAGNOSIS — Z7901 Long term (current) use of anticoagulants: Secondary | ICD-10-CM | POA: Diagnosis not present

## 2018-10-08 DIAGNOSIS — I4891 Unspecified atrial fibrillation: Secondary | ICD-10-CM

## 2018-10-08 DIAGNOSIS — Z8249 Family history of ischemic heart disease and other diseases of the circulatory system: Secondary | ICD-10-CM | POA: Diagnosis not present

## 2018-10-08 DIAGNOSIS — R0602 Shortness of breath: Secondary | ICD-10-CM | POA: Insufficient documentation

## 2018-10-08 DIAGNOSIS — R42 Dizziness and giddiness: Secondary | ICD-10-CM | POA: Diagnosis not present

## 2018-10-08 DIAGNOSIS — Z952 Presence of prosthetic heart valve: Secondary | ICD-10-CM | POA: Diagnosis not present

## 2018-10-08 DIAGNOSIS — I5042 Chronic combined systolic (congestive) and diastolic (congestive) heart failure: Secondary | ICD-10-CM

## 2018-10-08 LAB — BASIC METABOLIC PANEL
ANION GAP: 9 (ref 5–15)
BUN: 16 mg/dL (ref 8–23)
CALCIUM: 9.3 mg/dL (ref 8.9–10.3)
CHLORIDE: 106 mmol/L (ref 98–111)
CO2: 22 mmol/L (ref 22–32)
CREATININE: 0.98 mg/dL (ref 0.61–1.24)
GFR calc non Af Amer: >60 mL/min (ref >60)
Glucose, Bld: 115 mg/dL — ABNORMAL HIGH (ref 70–99)
Potassium: 4.6 mmol/L (ref 3.5–5.1)
SODIUM: 137 mmol/L (ref 135–145)

## 2018-10-08 LAB — CBC
HEMATOCRIT: 44.8 % (ref 39.0–52.0)
Hemoglobin: 13.9 g/dL (ref 13.0–17.0)
MCH: 19.8 pg — ABNORMAL LOW (ref 26.0–34.0)
MCHC: 31 g/dL (ref 30.0–36.0)
MCV: 63.9 fL — AB (ref 80.0–100.0)
NRBC: 0.2 % (ref 0.0–0.2)
PLATELETS: 156 10*3/uL (ref 150–400)
RBC: 7.01 MIL/uL — ABNORMAL HIGH (ref 4.22–5.81)
RDW: 18.2 % — AB (ref 11.5–15.5)
WBC: 10.8 10*3/uL — ABNORMAL HIGH (ref 4.0–10.5)

## 2018-10-08 NOTE — Progress Notes (Signed)
PCP: Dr Rex Kras - Also manages [facility]R Cardiology: Dr. Martinique  HF Cardiology: Dr Aundra Dubin   HPI: Mr Ambrocio is a 78 y.o. male with a history of severe AS s/p St Jude mechanical AVR in 03/2003 on chronic coumadin, HTN, obesity, and mild LV dysfxn EF 40-45% on 01/2016 echo. Prior to admit in 1/18, he had a cough and dyspnea that was thought to be bronchitis so he was treated with abx and oral steroids. He was seen in cardiology office in 1/18 and was noted to be in rapid atrial fibrillation with significant volume overload.   Admitted 1/23 through 08/29/16 with marked volume overload, lower extremity edema, and atrial fibrillation with RVR.  Initial cardioversion failed and he was difficult to diurese. He was placed on milrinone for low output and diuresed with IV lasix with good response.  Weight came down considerably. Amiodarone was begun. Once fully diuresed, he had successful DC-CV on 08/28/16. Discharged on torsemide 60 mg daily. Discharge weight 212 pounds. Echo and TEE in 1/18 had shown fall in EF to around 25%.   He continued to lose weight as an outpatient.  He had a syncopal episode where he stood up from sitting and got lightheaded with transient loss of consciousness.  He had been having orthostatic symptoms.  We saw him in the office and cut back on torsemide from 60 mg daily down eventually to 20 mg daily.  Creatinine up some to 1.7 at that time.    At a prior appt, he was back in atrial fibrillation.  He was set up for DCCV in 3/18, but was in NSR when he arrived to short stay. He had atrial fibrillation ablation with Dr. Curt Bears in 5/18.   Echo was done in 5/18, EF up to 45% with mild diffuse hypokinesis, normal mechanical aortic valve, mildly decreased RV systolic function.    Echo was repeated in 5/19, EF 35-40%, diffuse hypokinesis, mildly decreased RV systolic function, stable mechanical aortic valve.   He returns for followup of CHF.  Weight is up 3 lbs.  He is short of breath walking up  stairs but no dyspnea walking on flat ground.  No chest pain.  No BRBPR/melena. No palpitations. He has not had to use torsemide. SBP 100s-110s, occasional mild lightheadedness with standing, no falls.    ECG (personally reviewed): NSR, iRBBB, anterior Qs  Labs (09/04/2016):  Hgb 15.9, K 5.0, Creatinine 1.77, digoxin 0.7, BNP 326  Labs (3/18): digoxin 0.8, K 4.8, creatinine 1.44, TSH normal, AST 54, ALT 49 Labs (4/18): K 4.3, creatinine 1.3 Labs (7/18): K 3.8, creatinine 0.9, hgb 11.9 Labs (11/18): K 4.9, creatinine 1.0, hgb 13.5 Labs (3/19): K 4.7,creatinine 0.99 Labs (5/19): K 4.6, creatinine 0.98 Labs (9/19): K 4.2, creatinine 0.91 Labs (12/19): K 4.6, creatinine 1.14  PMH: 1. Aortic stenosis: # 25 St Jude mechanical aortic valve in 9/14.   2. Chronic systolic CHF: Echo in 5/17 with EF 45-50%.  Admitted in 1/18 with afib with RVR, ?tachycardia-mediated cardiomyopathy.  - Echo (1/18) with EF 25-30%, stable mechanical aortic valve.  - TEE (1/18) with EF 20-25%, stable mechanical aortic valve, moderate MR, small PFO.  - Admission 1/18 with acute on chronic systolic CHF, required milrinone, extensively diuresed.  - Echo (5/18) with EF 45%, diffuse hypokinesis, normal RV size with mildly decreased systolic function, mechanical aortic valve with mean gradient 14 mmHg, ascending aorta 4.4 cm.  - Echo (5/19) with EF 35-40%, diffuse hypokinesis, mild LVH, mildly decreased RV systolic function, mechanical  aortic valve looked ok.   3. Atrial fibrillation: First noted in 5/53, uncertain how long it had been present (with RVR).  08/28/2016 Successful DC-CV --> NSR.  Back in atrial fibrillation at 09/18/16 office visit.  Set up for DCCV in 3/18 but back in NSR.   - Atrial fibrillation ablation in 5/18. Now off amiodarone.  4. 09/04/2016 Syncope--> over-diuresis most likely.  5. HTN 6. Ascending aortic aneurysm: 4.4 cm on 7/17 echo, 4.4 cm on 5/18 echo.  - MRA chest (4/19): 4.5 cm ascending aortic  aneurysm.  7. Prostate cancer: Treated with radioactive seed implantation.  8. Nephrolithiasis.  9. PFTs (4/18): Mild restriction.   ROS: All systems negative except as listed in HPI, PMH and Problem List.  SH:  Social History   Socioeconomic History  . Marital status: Married    Spouse name: Not on file  . Number of children: 4  . Years of education: Not on file  . Highest education level: Not on file  Occupational History  . 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 Financial resource strain: Not on file  . Food insecurity:    Worry: Not on file    Inability: Not on file  . Transportation needs:    Medical: Not on file    Non-medical: Not on file  Tobacco Use  . Smoking status: Never Smoker  . Smokeless tobacco: Never Used  Substance and Sexual Activity  . Alcohol use: Yes    Alcohol/week: 3.0 standard drinks    Types: 3 Cans of beer per week    Comment:  "may have 2-3 beers on the weekend"  . Drug use: No  . Sexual activity: Not on file  Lifestyle  . Physical activity:    Days per week: Not on file    Minutes per session: Not on file  . Stress: Not on file  Relationships  . Social connections:    Talks on phone: Not on file    Gets together: Not on file    Attends religious service: Not on file    Active member of club or organization: Not on file    Attends meetings of clubs or organizations: Not on file    Relationship status: Not on file  . Intimate partner violence:    Fear of current or ex partner: Not on file    Emotionally abused: Not on file    Physically abused: Not on file    Forced sexual activity: Not on file  Other Topics Concern  . Not on file  Social History Narrative  . Not on file    FH:  Family History  Problem Relation Age of Onset  . Heart failure Mother   . Diabetes Mother   . Prostate cancer Father     Current Outpatient Medications  Medication Sig Dispense Refill  . allopurinol (ZYLOPRIM) 300 MG  tablet Take 300 mg by mouth daily.     Marland Kitchen aspirin 81 MG chewable tablet Chew 1 tablet (81 mg total) by mouth daily. 30 tablet 6  . colchicine 0.6 MG tablet Take 0.6 mg by mouth daily as needed (for gout).     Marland Kitchen docusate sodium (COLACE) 100 MG capsule Take 100 mg by mouth 2 (two) times daily.     . folic acid (FOLVITE) 1 MG tablet TAKE 1 TABLET(1 MG) BY MOUTH DAILY 30 tablet 0  . metoprolol succinate (TOPROL-XL) 25 MG 24 hr tablet Take 1 tablet (25 mg total)  by mouth 2 (two) times daily. 60 tablet 3  . Psyllium (METAMUCIL PO) Take 2 capsules by mouth 2 (two) times daily.     . sacubitril-valsartan (ENTRESTO) 24-26 MG Take 1 tablet by mouth 2 (two) times daily. 60 tablet 6  . spironolactone (ALDACTONE) 25 MG tablet TAKE 1 TABLET BY MOUTH DAILY 30 tablet 3  . tamsulosin (FLOMAX) 0.4 MG CAPS capsule Take 0.4 mg by mouth every other day.     . tolterodine (DETROL LA) 4 MG 24 hr capsule Take 4 mg by mouth every other day.   0  . torsemide (DEMADEX) 20 MG tablet Take 1 tablet by mouth daily as needed.  3  . warfarin (COUMADIN) 5 MG tablet Take 5 mg by mouth as directed. 1/2 Tablet(2.5mg ) Monday, Wednesday, Friday. 1 tablet( 5mg )Tuesday, Thursday, Saturday, Sunday.     No current facility-administered medications for this encounter.     Vitals:   10/08/18 0959  BP: 118/72  Pulse: 73  SpO2: 98%  Weight: 89.8 kg (198 lb)   Filed Weights   10/08/18 0959  Weight: 89.8 kg (198 lb)   PHYSICAL EXAM: General: NAD Neck: No JVD, no thyromegaly or thyroid nodule.  Lungs: Clear to auscultation bilaterally with normal respiratory effort. CV: Nondisplaced PMI.  Heart regular S1/S2 with mechanical S2, no S3/S4, 1/6 SEM RUSB.  1+ ankle edema.  No carotid bruit.  Normal pedal pulses.  Abdomen: Soft, nontender, no hepatosplenomegaly, no distention.  Skin: Intact without lesions or rashes.  Neurologic: Alert and oriented x 3.  Psych: Normal affect. Extremities: No clubbing or cyanosis.  HEENT: Normal.    ASSESSMENT & PLAN: 1. Chronic systolic CHF: EF on TEE in 1/18 20-25%, mechanical aortic valve ok. 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  He required milrinone to assist diuresis during 1/18-2/18 admission.  Now that he is back in NSR s/p atrial fibrillation ablation, echo in 5/18 showed EF back up to 45%.  Echo in 5/19 showed mildly lower systolic function, EF 93-90%. Currently, NYHA class II symptoms and does not appear significantly volume overloaded.  Medication titration has been limited by lightheadedness in the past though he is tolerating current regimen.     - Continue spironolactone 25 mg daily.  - Continue Entresto 24/26 bid. BMET today.      - Continue Toprol XL 25 mg bid.  - Given no ACS/chest pain and stabilization of EF, I think we can defer coronary angiography.  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  If he has any chest pain or further fall in EF, will need angiography.  2. Mechanical aortic valve: Continue warfarin and ASA 81. Valve looked ok on 5/19 echo. 3. Atrial fibrillation: Admitted 1/18 with atrial fibrillation with RVR and CHF.  EF down to 20-25%.  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  Need to keep him in NSR. DCCV during 2/18 admission and now s/p atrial fibrillation ablation.  He is in NSR today.  -  He is now off amiodarone.   - Continue warfarin.  CBC today.   4. Ascending aortic aneurysm: 4.5 cm on 4/19 MRA chest.  Repeat MRA chest in 4/20.    Followup in 4 months.  [name] 10/08/2018

## 2018-10-08 NOTE — Patient Instructions (Signed)
Labs today  You will need a MRA of your chest in April to follow up on your aneurysm   Your physician recommends that you schedule a follow-up appointment in: 3 months

## 2018-10-14 DIAGNOSIS — M1711 Unilateral primary osteoarthritis, right knee: Secondary | ICD-10-CM | POA: Diagnosis not present

## 2018-10-16 DIAGNOSIS — Z7901 Long term (current) use of anticoagulants: Secondary | ICD-10-CM | POA: Diagnosis not present

## 2018-10-16 DIAGNOSIS — Z952 Presence of prosthetic heart valve: Secondary | ICD-10-CM | POA: Diagnosis not present

## 2018-10-23 DIAGNOSIS — M1711 Unilateral primary osteoarthritis, right knee: Secondary | ICD-10-CM | POA: Diagnosis not present

## 2018-10-24 ENCOUNTER — Other Ambulatory Visit (HOSPITAL_COMMUNITY): Payer: Self-pay | Admitting: *Deleted

## 2018-10-24 MED ORDER — SACUBITRIL-VALSARTAN 24-26 MG PO TABS: 1.0000 | ORAL_TABLET | Freq: Two times a day (BID) | ORAL | 2 refills | Status: DC

## 2018-10-24 MED ORDER — SPIRONOLACTONE 25 MG PO TABS: 25.0000 mg | ORAL_TABLET | Freq: Every day | ORAL | 2 refills | Status: DC

## 2018-10-24 MED ORDER — METOPROLOL SUCCINATE ER 25 MG PO TB24: 25.0000 mg | ORAL_TABLET | Freq: Two times a day (BID) | ORAL | 2 refills | Status: DC

## 2018-12-11 DIAGNOSIS — Z952 Presence of prosthetic heart valve: Secondary | ICD-10-CM | POA: Diagnosis not present

## 2018-12-11 DIAGNOSIS — Z7901 Long term (current) use of anticoagulants: Secondary | ICD-10-CM | POA: Diagnosis not present

## 2018-12-17 ENCOUNTER — Other Ambulatory Visit: Payer: Medicare Other

## 2018-12-17 ENCOUNTER — Ambulatory Visit: Payer: Medicare Other | Admitting: Family

## 2018-12-18 ENCOUNTER — Ambulatory Visit: Payer: Medicare Other | Admitting: Family

## 2018-12-18 ENCOUNTER — Other Ambulatory Visit: Payer: Medicare Other

## 2019-01-13 ENCOUNTER — Other Ambulatory Visit: Payer: Self-pay | Admitting: Hematology & Oncology

## 2019-01-13 DIAGNOSIS — D500 Iron deficiency anemia secondary to blood loss (chronic): Secondary | ICD-10-CM

## 2019-01-14 ENCOUNTER — Encounter (HOSPITAL_COMMUNITY): Payer: Medicare Other | Admitting: Cardiology

## 2019-01-16 DIAGNOSIS — Z952 Presence of prosthetic heart valve: Secondary | ICD-10-CM | POA: Diagnosis not present

## 2019-01-16 DIAGNOSIS — Z7901 Long term (current) use of anticoagulants: Secondary | ICD-10-CM | POA: Diagnosis not present

## 2019-02-17 ENCOUNTER — Ambulatory Visit (HOSPITAL_COMMUNITY)
Admission: RE | Admit: 2019-02-17 | Discharge: 2019-02-17 | Disposition: A | Discharge status: Home / Self Care | Payer: Medicare Other | Source: Ambulatory Visit | Attending: Cardiology | Admitting: Cardiology

## 2019-02-17 ENCOUNTER — Other Ambulatory Visit: Payer: Self-pay

## 2019-02-17 DIAGNOSIS — Z952 Presence of prosthetic heart valve: Secondary | ICD-10-CM | POA: Diagnosis not present

## 2019-02-17 DIAGNOSIS — I712 Thoracic aortic aneurysm, without rupture: Secondary | ICD-10-CM | POA: Insufficient documentation

## 2019-02-17 DIAGNOSIS — I714 Abdominal aortic aneurysm, without rupture, unspecified: Secondary | ICD-10-CM

## 2019-02-17 LAB — CREATININE, SERUM
Creatinine, Ser: 1.07 mg/dL (ref 0.61–1.24)
GFR calc Af Amer: >60 mL/min (ref >60)
GFR calc non Af Amer: >60 mL/min (ref >60)

## 2019-02-17 MED ORDER — GADOBUTROL 1 MMOL/ML IV SOLN
10.0000 mL | Freq: Once | INTRAVENOUS | Status: AC | PRN
  Administered 2019-02-17: 14:00:00 10 mL via INTRAVENOUS

## 2019-02-20 ENCOUNTER — Telehealth (HOSPITAL_COMMUNITY): Payer: Self-pay

## 2019-02-20 DIAGNOSIS — I714 Abdominal aortic aneurysm, without rupture, unspecified: Secondary | ICD-10-CM

## 2019-02-20 NOTE — Telephone Encounter (Signed)
LM for patient with stable study results.  Repeat study ordered for 1 year.

## 2019-02-20 NOTE — Telephone Encounter (Signed)
-----   Message from Larey Dresser, MD sent at 02/17/2019 11:35 PM EDT ----- Stable 4.5 cm ascending aorta, repeat study in 1 year.

## 2019-02-25 ENCOUNTER — Ambulatory Visit (HOSPITAL_COMMUNITY)
Admission: RE | Admit: 2019-02-25 | Discharge: 2019-02-25 | Disposition: A | Discharge status: Home / Self Care | Payer: Medicare Other | Source: Ambulatory Visit | Attending: Cardiology | Admitting: Cardiology

## 2019-02-25 ENCOUNTER — Encounter (HOSPITAL_COMMUNITY): Payer: Self-pay | Admitting: Cardiology

## 2019-02-25 ENCOUNTER — Other Ambulatory Visit: Payer: Self-pay

## 2019-02-25 VITALS — BP 94/62 | HR 68 | Wt 195.0 lb

## 2019-02-25 DIAGNOSIS — Z7901 Long term (current) use of anticoagulants: Secondary | ICD-10-CM | POA: Diagnosis not present

## 2019-02-25 DIAGNOSIS — Z952 Presence of prosthetic heart valve: Secondary | ICD-10-CM

## 2019-02-25 DIAGNOSIS — I480 Paroxysmal atrial fibrillation: Secondary | ICD-10-CM | POA: Diagnosis not present

## 2019-02-25 DIAGNOSIS — Z79899 Other long term (current) drug therapy: Secondary | ICD-10-CM | POA: Diagnosis not present

## 2019-02-25 DIAGNOSIS — I110 Hypertensive heart disease with heart failure: Secondary | ICD-10-CM | POA: Diagnosis not present

## 2019-02-25 DIAGNOSIS — Z8042 Family history of malignant neoplasm of prostate: Secondary | ICD-10-CM | POA: Diagnosis not present

## 2019-02-25 DIAGNOSIS — I5022 Chronic systolic (congestive) heart failure: Secondary | ICD-10-CM | POA: Diagnosis not present

## 2019-02-25 DIAGNOSIS — Z8546 Personal history of malignant neoplasm of prostate: Secondary | ICD-10-CM | POA: Diagnosis not present

## 2019-02-25 DIAGNOSIS — Z7982 Long term (current) use of aspirin: Secondary | ICD-10-CM | POA: Diagnosis not present

## 2019-02-25 DIAGNOSIS — I712 Thoracic aortic aneurysm, without rupture: Secondary | ICD-10-CM | POA: Diagnosis not present

## 2019-02-25 DIAGNOSIS — Z87442 Personal history of urinary calculi: Secondary | ICD-10-CM | POA: Diagnosis not present

## 2019-02-25 DIAGNOSIS — Z8249 Family history of ischemic heart disease and other diseases of the circulatory system: Secondary | ICD-10-CM | POA: Diagnosis not present

## 2019-02-25 DIAGNOSIS — Z833 Family history of diabetes mellitus: Secondary | ICD-10-CM | POA: Insufficient documentation

## 2019-02-25 DIAGNOSIS — I4891 Unspecified atrial fibrillation: Secondary | ICD-10-CM | POA: Insufficient documentation

## 2019-02-25 DIAGNOSIS — I5043 Acute on chronic combined systolic (congestive) and diastolic (congestive) heart failure: Secondary | ICD-10-CM

## 2019-02-25 LAB — CBC
HCT: 42.8 % (ref 39.0–52.0)
Hemoglobin: 13.2 g/dL (ref 13.0–17.0)
MCH: 19.6 pg — ABNORMAL LOW (ref 26.0–34.0)
MCHC: 30.8 g/dL (ref 30.0–36.0)
MCV: 63.7 fL — ABNORMAL LOW (ref 80.0–100.0)
Platelets: 168 10*3/uL (ref 150–400)
RBC: 6.72 MIL/uL — ABNORMAL HIGH (ref 4.22–5.81)
RDW: 17.7 % — ABNORMAL HIGH (ref 11.5–15.5)
WBC: 9.6 10*3/uL (ref 4.0–10.5)
nRBC: 0 % (ref 0.0–0.2)

## 2019-02-25 LAB — LIPID PANEL
Cholesterol: 152 mg/dL (ref 0–200)
HDL: 37 mg/dL — ABNORMAL LOW (ref >40)
LDL Cholesterol: 91 mg/dL (ref 0–99)
Total CHOL/HDL Ratio: 4.1 RATIO
Triglycerides: 121 mg/dL (ref <150)
VLDL: 24 mg/dL (ref 0–40)

## 2019-02-25 LAB — BASIC METABOLIC PANEL
Anion gap: 10 (ref 5–15)
BUN: 25 mg/dL — ABNORMAL HIGH (ref 8–23)
CO2: 25 mmol/L (ref 22–32)
Calcium: 9.5 mg/dL (ref 8.9–10.3)
Chloride: 103 mmol/L (ref 98–111)
Creatinine, Ser: 1.07 mg/dL (ref 0.61–1.24)
GFR calc Af Amer: >60 mL/min (ref >60)
GFR calc non Af Amer: >60 mL/min (ref >60)
Glucose, Bld: 96 mg/dL (ref 70–99)
Potassium: 4.7 mmol/L (ref 3.5–5.1)
Sodium: 138 mmol/L (ref 135–145)

## 2019-02-25 NOTE — Patient Instructions (Signed)
Labs were done today. We will call you with any ABNORMAL results. No news is good news!  EKG was completed.  Your physician has requested that you have an echocardiogram. Echocardiography is a painless test that uses sound waves to create images of your heart. It provides your doctor with information about the size and shape of your heart and how well your heart's chambers and valves are working. This procedure takes approximately one hour. There are no restrictions for this procedure.  Your physician recommends that you schedule a follow-up appointment in: 3-4 months.  At the [facility], you and your health needs are our priority. As part of our continuing mission to provide you with exceptional heart care, we have created designated Provider Care Teams. These Care Teams include your primary Cardiologist (physician) and Advanced Practice Providers (APPs- Physician Assistants and Nurse Practitioners) who all work together to provide you with the care you need, when you need it.   You may see any of the following providers on your designated Care Team at your next follow up: Marland Kitchen Dr Glori Bickers . Dr [name] . Darrick Grinder, NP   Please be sure to bring in all your medications bottles to every appointment.

## 2019-02-25 NOTE — Progress Notes (Signed)
PCP: Dr Rex Kras - Also manages INR Cardiology: Dr. Martinique  HF Cardiology: Dr Aundra Dubin   HPI: Adrian Lambert is a 78 y.o. male with a history of severe AS s/p St Jude mechanical AVR in 03/2003 on chronic coumadin, HTN, obesity, and mild LV dysfxn EF 40-45% on 01/2016 echo. Prior to admit in 1/18, he had a cough and dyspnea that was thought to be bronchitis so he was treated with abx and oral steroids. He was seen in cardiology office in 1/18 and was noted to be in rapid atrial fibrillation with significant volume overload.   Admitted 1/23 through 08/29/16 with marked volume overload, lower extremity edema, and atrial fibrillation with RVR.  Initial cardioversion failed and he was difficult to diurese. He was placed on milrinone for low output and diuresed with IV lasix with good response.  Weight came down considerably. Amiodarone was begun. Once fully diuresed, he had successful DC-CV on 08/28/16. Discharged on torsemide 60 mg daily. Discharge weight 212 pounds. Echo and TEE in 1/18 had shown fall in EF to around 25%.   He continued to lose weight as an outpatient.  He had a syncopal episode where he stood up from sitting and got lightheaded with transient loss of consciousness.  He had been having orthostatic symptoms.  We saw him in the office and cut back on torsemide from 60 mg daily down eventually to 20 mg daily.  Creatinine up some to 1.7 at that time.    At a prior appt, he was back in atrial fibrillation.  He was set up for DCCV in 3/18, but was in NSR when he arrived to short stay. He had atrial fibrillation ablation with Dr. Curt Bears in 5/18.   Echo was done in 5/18, EF up to 45% with mild diffuse hypokinesis, normal mechanical aortic valve, mildly decreased RV systolic function.    Echo was repeated in 5/19, EF 35-40%, diffuse hypokinesis, mildly decreased RV systolic function, stable mechanical aortic valve.   He returns for followup of CHF.  Weight is down 3 lbs.  He is stable symptomatically.  No  dyspnea walking on flat ground, mild dyspnea with a flight of stairs.  No orthopnea/PND.  No chest pain.  He is walking for exercise.    ECG (personally reviewed): NSR, 1st degree AVB, LAFB, iRBBB  Labs (09/04/2016):  Hgb 15.9, K 5.0, Creatinine 1.77, digoxin 0.7, BNP 326  Labs (3/18): digoxin 0.8, K 4.8, creatinine 1.44, TSH normal, AST 54, ALT 49 Labs (4/18): K 4.3, creatinine 1.3 Labs (7/18): K 3.8, creatinine 0.9, hgb 11.9 Labs (11/18): K 4.9, creatinine 1.0, hgb 13.5 Labs (3/19): K 4.7,creatinine 0.99 Labs (5/19): K 4.6, creatinine 0.98 Labs (9/19): K 4.2, creatinine 0.91 Labs (12/19): K 4.6, creatinine 1.14 Labs (3/20): K 4.6, creatinine 0.98 Labs (7/20): creatinine 1.07  PMH: 1. Aortic stenosis: # 25 St Jude mechanical aortic valve in 9/14.   2. Chronic systolic CHF: Echo in 0/63 with EF 45-50%.  Admitted in 1/18 with afib with RVR, ?tachycardia-mediated cardiomyopathy.  - Echo (1/18) with EF 25-30%, stable mechanical aortic valve.  - TEE (1/18) with EF 20-25%, stable mechanical aortic valve, moderate Adrian, small PFO.  - Admission 1/18 with acute on chronic systolic CHF, required milrinone, extensively diuresed.  - Echo (5/18) with EF 45%, diffuse hypokinesis, normal RV size with mildly decreased systolic function, mechanical aortic valve with mean gradient 14 mmHg, ascending aorta 4.4 cm.  - Echo (5/19) with EF 35-40%, diffuse hypokinesis, mild LVH, mildly decreased RV  systolic function, mechanical aortic valve looked ok.   3. Atrial fibrillation: First noted in 2/84, uncertain how long it had been present (with RVR).  08/28/2016 Successful DC-CV --> NSR.  Back in atrial fibrillation at 09/18/16 office visit.  Set up for DCCV in 3/18 but back in NSR.   - Atrial fibrillation ablation in 5/18. Now off amiodarone.  4. 09/04/2016 Syncope--> over-diuresis most likely.  5. HTN 6. Ascending aortic aneurysm: 4.4 cm on 7/17 echo, 4.4 cm on 5/18 echo.  - MRA chest (4/19): 4.5 cm ascending aortic  aneurysm.  - MRA chest (7/20): 4.5 cm ascending aortic aneurysm.  7. Prostate cancer: Treated with radioactive seed implantation.  8. Nephrolithiasis.  9. PFTs (4/18): Mild restriction.   ROS: All systems negative except as listed in HPI, PMH and Problem List.  SH:  Social History   Socioeconomic History  . Marital status: Married    Spouse name: Not on file  . Number of children: 4  . Years of education: Not on file  . Highest education level: Not on file  Occupational History  . 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 Financial resource strain: Not on file  . Food insecurity    Worry: Not on file    Inability: Not on file  . Transportation needs    Medical: Not on file    Non-medical: Not on file  Tobacco Use  . Smoking status: Never Smoker  . Smokeless tobacco: Never Used  Substance and Sexual Activity  . Alcohol use: Yes    Alcohol/week: 3.0 standard drinks    Types: 3 Cans of beer per week    Comment:  "may have 2-3 beers on the weekend"  . Drug use: No  . Sexual activity: Not on file  Lifestyle  . Physical activity    Days per week: Not on file    Minutes per session: Not on file  . Stress: Not on file  Relationships  . Social Herbalist on phone: Not on file    Gets together: Not on file    Attends religious service: Not on file    Active member of club or organization: Not on file    Attends meetings of clubs or organizations: Not on file    Relationship status: Not on file  . Intimate partner violence    Fear of current or ex partner: Not on file    Emotionally abused: Not on file    Physically abused: Not on file    Forced sexual activity: Not on file  Other Topics Concern  . Not on file  Social History Narrative  . Not on file    FH:  Family History  Problem Relation Age of Onset  . Heart failure Mother   . Diabetes Mother   . Prostate cancer Father     Current Outpatient Medications  Medication Sig  Dispense Refill  . allopurinol (ZYLOPRIM) 300 MG tablet Take 300 mg by mouth daily.     Marland Kitchen aspirin 81 MG chewable tablet Chew 1 tablet (81 mg total) by mouth daily. 30 tablet 6  . colchicine 0.6 MG tablet Take 0.6 mg by mouth daily as needed (for gout).     . folic acid (FOLVITE) 1 MG tablet TAKE 1 TABLET(1 MG) BY MOUTH DAILY 90 tablet 3  . metoprolol succinate (TOPROL-XL) 25 MG 24 hr tablet Take 1 tablet (25 mg total) by mouth 2 (two) times daily. [place]  tablet 2  . Psyllium (METAMUCIL PO) Take 2 capsules by mouth 2 (two) times daily.     . sacubitril-valsartan (ENTRESTO) 24-26 MG Take 1 tablet by mouth 2 (two) times daily. 180 tablet 2  . spironolactone (ALDACTONE) 25 MG tablet Take 1 tablet (25 mg total) by mouth daily. 90 tablet 2  . tamsulosin (FLOMAX) 0.4 MG CAPS capsule Take 0.4 mg by mouth every other day.     . tolterodine (DETROL LA) 4 MG 24 hr capsule Take 4 mg by mouth every other day.   0  . torsemide (DEMADEX) 20 MG tablet Take 1 tablet by mouth daily as needed.  3  . warfarin (COUMADIN) 5 MG tablet Take 5 mg by mouth as directed. 1/2 Tablet(2.5mg ) Monday, Wednesday, Friday. 1 tablet( 5mg )Tuesday, Thursday, Saturday, Sunday.     No current facility-administered medications for this encounter.     Vitals:   02/25/19 0939  BP: 94/62  Pulse: 68  SpO2: 100%  Weight: 88.5 kg (195 lb)   Filed Weights   02/25/19 0939  Weight: 88.5 kg (195 lb)   PHYSICAL EXAM: General: NAD Neck: No JVD, no thyromegaly or thyroid nodule.  Lungs: Clear to auscultation bilaterally with normal respiratory effort. CV: Nondisplaced PMI.  Heart irregular S1/S2 with mechanical S2, no S3/S4, no murmur.  1+ ankle edema.  No carotid bruit.  Normal pedal pulses.  Abdomen: Soft, nontender, no hepatosplenomegaly, no distention.  Skin: Intact without lesions or rashes.  Neurologic: Alert and oriented x 3.  Psych: Normal affect. Extremities: No clubbing or cyanosis.  HEENT: Normal.   ASSESSMENT & PLAN: 1.  Chronic systolic CHF: EF on TEE in 1/18 20-25%, mechanical aortic valve ok. 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  He required milrinone to assist diuresis during 1/18-2/18 admission.  Now that he is back in NSR s/p atrial fibrillation ablation, echo in 5/18 showed EF back up to 45%.  Echo in 5/19 showed mildly lower systolic function, EF 16-10%. Currently, NYHA class II symptoms and does not appear significantly volume overloaded.  Medication titration has been limited by lightheadedness in the past though he is tolerating current regimen.   No BP room to titrate meds today.  - Continue spironolactone 25 mg daily.  - Continue Entresto 24/26 bid. BMET today.      - Continue Toprol XL 25 mg bid.  - Given no ACS/chest pain and stabilization of EF, I think we can defer coronary angiography.  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  If he has any chest pain or further fall in EF, will need angiography.  - I will arrange for repeat echo at next appointment.  2. Mechanical aortic valve: Continue warfarin and ASA 81. Valve looked ok on 5/19 echo. 3. Atrial fibrillation: Admitted 1/18 with atrial fibrillation with RVR and CHF.  EF down to 20-25%.  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  Need to keep him in NSR. DCCV during 2/18 admission and now s/p atrial fibrillation ablation.  He is in NSR today.  -  He is now off amiodarone.   - Continue warfarin.  CBC today.   4. Ascending aortic aneurysm: 4.5 cm on 7/20 MRA chest.  Repeat MRA chest in 7/21.    Followup in 3 months with echo.  [name] 02/25/2019

## 2019-03-26 DIAGNOSIS — C61 Malignant neoplasm of prostate: Secondary | ICD-10-CM | POA: Diagnosis not present

## 2019-04-02 DIAGNOSIS — N3281 Overactive bladder: Secondary | ICD-10-CM | POA: Diagnosis not present

## 2019-04-02 DIAGNOSIS — N401 Enlarged prostate with lower urinary tract symptoms: Secondary | ICD-10-CM | POA: Diagnosis not present

## 2019-04-02 DIAGNOSIS — C61 Malignant neoplasm of prostate: Secondary | ICD-10-CM | POA: Diagnosis not present

## 2019-05-01 DIAGNOSIS — Z0000 Encounter for general adult medical examination without abnormal findings: Secondary | ICD-10-CM | POA: Diagnosis not present

## 2019-05-01 DIAGNOSIS — I5022 Chronic systolic (congestive) heart failure: Secondary | ICD-10-CM | POA: Diagnosis not present

## 2019-05-01 DIAGNOSIS — I712 Thoracic aortic aneurysm, without rupture: Secondary | ICD-10-CM | POA: Diagnosis not present

## 2019-05-01 DIAGNOSIS — I4891 Unspecified atrial fibrillation: Secondary | ICD-10-CM | POA: Diagnosis not present

## 2019-05-07 DIAGNOSIS — Z23 Encounter for immunization: Secondary | ICD-10-CM | POA: Diagnosis not present

## 2019-05-07 DIAGNOSIS — L821 Other seborrheic keratosis: Secondary | ICD-10-CM | POA: Diagnosis not present

## 2019-05-07 DIAGNOSIS — L820 Inflamed seborrheic keratosis: Secondary | ICD-10-CM | POA: Diagnosis not present

## 2019-05-07 DIAGNOSIS — L570 Actinic keratosis: Secondary | ICD-10-CM | POA: Diagnosis not present

## 2019-05-28 ENCOUNTER — Ambulatory Visit (HOSPITAL_BASED_OUTPATIENT_CLINIC_OR_DEPARTMENT_OTHER)
Admission: RE | Admit: 2019-05-28 | Discharge: 2019-05-28 | Disposition: A | Discharge status: Home / Self Care | Payer: Medicare Other | Source: Ambulatory Visit | Attending: Cardiology | Admitting: Cardiology

## 2019-05-28 ENCOUNTER — Encounter (HOSPITAL_COMMUNITY): Payer: Self-pay | Admitting: Cardiology

## 2019-05-28 ENCOUNTER — Other Ambulatory Visit: Payer: Self-pay

## 2019-05-28 ENCOUNTER — Ambulatory Visit (HOSPITAL_COMMUNITY)
Admission: RE | Admit: 2019-05-28 | Discharge: 2019-05-28 | Disposition: A | Discharge status: Home / Self Care | Payer: Medicare Other | Source: Ambulatory Visit | Attending: Cardiology | Admitting: Cardiology

## 2019-05-28 VITALS — BP 102/76 | HR 64 | Wt 197.6 lb

## 2019-05-28 DIAGNOSIS — I110 Hypertensive heart disease with heart failure: Secondary | ICD-10-CM | POA: Diagnosis not present

## 2019-05-28 DIAGNOSIS — I5043 Acute on chronic combined systolic (congestive) and diastolic (congestive) heart failure: Secondary | ICD-10-CM

## 2019-05-28 DIAGNOSIS — I712 Thoracic aortic aneurysm, without rupture: Secondary | ICD-10-CM | POA: Insufficient documentation

## 2019-05-28 DIAGNOSIS — I5022 Chronic systolic (congestive) heart failure: Secondary | ICD-10-CM | POA: Insufficient documentation

## 2019-05-28 DIAGNOSIS — Z7901 Long term (current) use of anticoagulants: Secondary | ICD-10-CM | POA: Insufficient documentation

## 2019-05-28 DIAGNOSIS — Z8042 Family history of malignant neoplasm of prostate: Secondary | ICD-10-CM | POA: Diagnosis not present

## 2019-05-28 DIAGNOSIS — Z952 Presence of prosthetic heart valve: Secondary | ICD-10-CM | POA: Insufficient documentation

## 2019-05-28 DIAGNOSIS — I4891 Unspecified atrial fibrillation: Secondary | ICD-10-CM | POA: Diagnosis not present

## 2019-05-28 DIAGNOSIS — Z87442 Personal history of urinary calculi: Secondary | ICD-10-CM | POA: Insufficient documentation

## 2019-05-28 DIAGNOSIS — Z8249 Family history of ischemic heart disease and other diseases of the circulatory system: Secondary | ICD-10-CM | POA: Diagnosis not present

## 2019-05-28 DIAGNOSIS — Z79899 Other long term (current) drug therapy: Secondary | ICD-10-CM | POA: Insufficient documentation

## 2019-05-28 DIAGNOSIS — Z7982 Long term (current) use of aspirin: Secondary | ICD-10-CM | POA: Insufficient documentation

## 2019-05-28 DIAGNOSIS — Z833 Family history of diabetes mellitus: Secondary | ICD-10-CM | POA: Diagnosis not present

## 2019-05-28 DIAGNOSIS — Z8546 Personal history of malignant neoplasm of prostate: Secondary | ICD-10-CM | POA: Diagnosis not present

## 2019-05-28 DIAGNOSIS — I5032 Chronic diastolic (congestive) heart failure: Secondary | ICD-10-CM

## 2019-05-28 LAB — BASIC METABOLIC PANEL
Anion gap: 8 (ref 5–15)
BUN: 27 mg/dL — ABNORMAL HIGH (ref 8–23)
CO2: 25 mmol/L (ref 22–32)
Calcium: 9.2 mg/dL (ref 8.9–10.3)
Chloride: 107 mmol/L (ref 98–111)
Creatinine, Ser: 1.14 mg/dL (ref 0.61–1.24)
GFR calc Af Amer: >60 mL/min (ref >60)
GFR calc non Af Amer: >60 mL/min (ref >60)
Glucose, Bld: 118 mg/dL — ABNORMAL HIGH (ref 70–99)
Potassium: 5.6 mmol/L — ABNORMAL HIGH (ref 3.5–5.1)
Sodium: 140 mmol/L (ref 135–145)

## 2019-05-28 LAB — CBC
HCT: 42.3 % (ref 39.0–52.0)
Hemoglobin: 12.8 g/dL — ABNORMAL LOW (ref 13.0–17.0)
MCH: 19.5 pg — ABNORMAL LOW (ref 26.0–34.0)
MCHC: 30.3 g/dL (ref 30.0–36.0)
MCV: 64.4 fL — ABNORMAL LOW (ref 80.0–100.0)
Platelets: 141 10*3/uL — ABNORMAL LOW (ref 150–400)
RBC: 6.57 MIL/uL — ABNORMAL HIGH (ref 4.22–5.81)
RDW: 17.7 % — ABNORMAL HIGH (ref 11.5–15.5)
WBC: 9.4 10*3/uL (ref 4.0–10.5)
nRBC: 0 % (ref 0.0–0.2)

## 2019-05-28 NOTE — Progress Notes (Signed)
  Echocardiogram 2D Echocardiogram has been performed.  Adrian Lambert 05/28/2019, 9:36 AM

## 2019-05-28 NOTE — Progress Notes (Signed)
PCP: Dr Rex Kras - Also manages INR Cardiology: Dr. Martinique  HF Cardiology: Dr Aundra Dubin   HPI: Adrian Lambert is a 78 y.o. male with a history of severe AS s/p St Jude mechanical AVR in 03/2003 on chronic coumadin, HTN, obesity, and mild LV dysfxn EF 40-45% on 01/2016 echo. Prior to admit in 1/18, he had a cough and dyspnea that was thought to be bronchitis so he was treated with abx and oral steroids. He was seen in cardiology office in 1/18 and was noted to be in rapid atrial fibrillation with significant volume overload.   Admitted 1/23 through 08/29/16 with marked volume overload, lower extremity edema, and atrial fibrillation with RVR.  Initial cardioversion failed and he was difficult to diurese. He was placed on milrinone for low output and diuresed with IV lasix with good response.  Weight came down considerably. Amiodarone was begun. Once fully diuresed, he had successful DC-CV on 08/28/16. Discharged on torsemide 60 mg daily. Discharge weight 212 pounds. Echo and TEE in 1/18 had shown fall in EF to around 25%.   He continued to lose weight as an outpatient.  He had a syncopal episode where he stood up from sitting and got lightheaded with transient loss of consciousness.  He had been having orthostatic symptoms.  We saw him in the office and cut back on torsemide from 60 mg daily down eventually to 20 mg daily.  Creatinine up some to 1.7 at that time.    At a prior appt, he was back in atrial fibrillation.  He was set up for DCCV in 3/18, but was in NSR when he arrived to short stay. He had atrial fibrillation ablation with Dr. Curt Bears in 5/18.   Echo was done in 5/18, EF up to 45% with mild diffuse hypokinesis, normal mechanical aortic valve, mildly decreased RV systolic function.    Echo was repeated in 5/19, EF 35-40%, diffuse hypokinesis, mildly decreased RV systolic function, stable mechanical aortic valve.   Echo was done today and reviewed, EF up to 50-55%, normal RV size and systolic function,  mechanical aortic valve looks ok, normal IVC, ascending aorta 4.2 cm.   He returns for followup of CHF.  He is short of breath walking fast up stairs.  No dyspnea walking on flat ground.  Occasional lightheadedness if he stands fast but not particularly bothersome and no falls.  No palpitations.  No chest pain.  He has used torsemide twice in 6 weeks.   ECG (personally reviewed): NSR, 1st degree AVB, RBBB, LAFB  Labs (09/04/2016):  Hgb 15.9, K 5.0, Creatinine 1.77, digoxin 0.7, BNP 326  Labs (3/18): digoxin 0.8, K 4.8, creatinine 1.44, TSH normal, AST 54, ALT 49 Labs (4/18): K 4.3, creatinine 1.3 Labs (7/18): K 3.8, creatinine 0.9, hgb 11.9 Labs (11/18): K 4.9, creatinine 1.0, hgb 13.5 Labs (3/19): K 4.7,creatinine 0.99 Labs (5/19): K 4.6, creatinine 0.98 Labs (9/19): K 4.2, creatinine 0.91 Labs (12/19): K 4.6, creatinine 1.14 Labs (3/20): K 4.6, creatinine 0.98 Labs (7/20): creatinine 1.07  PMH: 1. Aortic stenosis: # 25 St Jude mechanical aortic valve in 9/14.   2. Chronic systolic CHF: Echo in [id number] with EF 45-50%.  Admitted in 1/18 with afib with RVR, ?tachycardia-mediated cardiomyopathy.  - Echo (1/18) with EF 25-30%, stable mechanical aortic valve.  - TEE (1/18) with EF 20-25%, stable mechanical aortic valve, moderate Adrian, small PFO.  - Admission 1/18 with acute on chronic systolic CHF, required milrinone, extensively diuresed.  - Echo (5/18) with EF  45%, diffuse hypokinesis, normal RV size with mildly decreased systolic function, mechanical aortic valve with mean gradient 14 mmHg, ascending aorta 4.4 cm.  - Echo (5/19) with EF 35-40%, diffuse hypokinesis, mild LVH, mildly decreased RV systolic function, mechanical aortic valve looked ok.   - Echo (11/20) with EF up to 50-55%, normal RV size and systolic function, mechanical aortic valve looks ok, normal IVC, ascending aorta 4.2 cm.  3. Atrial fibrillation: First noted in 123456, uncertain how long it had been present (with RVR).  08/28/2016  Successful DC-CV --> NSR.  Back in atrial fibrillation at 09/18/16 office visit.  Set up for DCCV in 3/18 but back in NSR.   - Atrial fibrillation ablation in 5/18. Now off amiodarone.  4. 09/04/2016 Syncope--> over-diuresis most likely.  5. HTN 6. Ascending aortic aneurysm: 4.4 cm on 7/17 echo, 4.4 cm on 5/18 echo.  - MRA chest (4/19): 4.5 cm ascending aortic aneurysm.  - MRA chest (7/20): 4.5 cm ascending aortic aneurysm.  7. Prostate cancer: Treated with radioactive seed implantation.  8. Nephrolithiasis.  9. PFTs (4/18): Mild restriction.   ROS: All systems negative except as listed in HPI, PMH and Problem List.  SH:  Social History   Socioeconomic History  . Marital status: Married    Spouse name: Not on file  . Number of children: 4  . Years of education: Not on file  . Highest education level: Not on file  Occupational History  . 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 Financial resource strain: Not on file  . Food insecurity    Worry: Not on file    Inability: Not on file  . Transportation needs    Medical: Not on file    Non-medical: Not on file  Tobacco Use  . Smoking status: Never Smoker  . Smokeless tobacco: Never Used  Substance and Sexual Activity  . Alcohol use: Yes    Alcohol/week: 3.0 standard drinks    Types: 3 Cans of beer per week    Comment:  "may have 2-3 beers on the weekend"  . Drug use: No  . Sexual activity: Not on file  Lifestyle  . Physical activity    Days per week: Not on file    Minutes per session: Not on file  . Stress: Not on file  Relationships  . Social Herbalist on phone: Not on file    Gets together: Not on file    Attends religious service: Not on file    Active member of club or organization: Not on file    Attends meetings of clubs or organizations: Not on file    Relationship status: Not on file  . Intimate partner violence    Fear of current or ex partner: Not on file    Emotionally  abused: Not on file    Physically abused: Not on file    Forced sexual activity: Not on file  Other Topics Concern  . Not on file  Social History Narrative  . Not on file    FH:  Family History  Problem Relation Age of Onset  . Heart failure Mother   . Diabetes Mother   . Prostate cancer Father     Current Outpatient Medications  Medication Sig Dispense Refill  . allopurinol (ZYLOPRIM) 300 MG tablet Take 300 mg by mouth daily.     Marland Kitchen aspirin 81 MG chewable tablet Chew 1 tablet (81 mg total) by mouth daily.  30 tablet 6  . colchicine 0.6 MG tablet Take 0.6 mg by mouth daily as needed (for gout).     . folic acid (FOLVITE) 1 MG tablet TAKE 1 TABLET(1 MG) BY MOUTH DAILY 90 tablet 3  . metoprolol succinate (TOPROL-XL) 25 MG 24 hr tablet Take 1 tablet (25 mg total) by mouth 2 (two) times daily. 180 tablet 2  . Psyllium (METAMUCIL PO) Take 2 capsules by mouth 2 (two) times daily.     . sacubitril-valsartan (ENTRESTO) 24-26 MG Take 1 tablet by mouth 2 (two) times daily. 180 tablet 2  . spironolactone (ALDACTONE) 25 MG tablet Take 1 tablet (25 mg total) by mouth daily. 90 tablet 2  . tamsulosin (FLOMAX) 0.4 MG CAPS capsule Take 0.4 mg by mouth every other day.     . tolterodine (DETROL LA) 4 MG 24 hr capsule Take 4 mg by mouth every other day.   0  . torsemide (DEMADEX) 20 MG tablet Take 1 tablet by mouth daily as needed.  3  . warfarin (COUMADIN) 5 MG tablet Take 5 mg by mouth as directed. 1/2 Tablet(2.5mg ) Monday, Wednesday, Friday. 1 tablet( 5mg )Tuesday, Thursday, Saturday, Sunday.     No current facility-administered medications for this encounter.     Vitals:   05/28/19 0952  BP: 102/76  Pulse: 64  SpO2: 97%  Weight: 89.6 kg (197 lb 9.6 oz)   Filed Weights   05/28/19 0952  Weight: 89.6 kg (197 lb 9.6 oz)   PHYSICAL EXAM: General: NAD Neck: No JVD, no thyromegaly or thyroid nodule.  Lungs: Clear to auscultation bilaterally with normal respiratory effort. CV: Nondisplaced  PMI.  Heart regular S1/S2 with mechanical S2, no S3/S4, 1/6 SEM RUSB. Trace ankle edema.  No carotid bruit.  Normal pedal pulses.  Abdomen: Soft, nontender, no hepatosplenomegaly, no distention.  Skin: Intact without lesions or rashes.  Neurologic: Alert and oriented x 3.  Psych: Normal affect. Extremities: No clubbing or cyanosis.  HEENT: Normal.   ASSESSMENT & PLAN: 1. Chronic systolic CHF: EF on TEE in 1/18 20-25%, mechanical aortic valve ok. 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  He required milrinone to assist diuresis during 1/18-2/18 admission.  Now that he is back in NSR s/p atrial fibrillation ablation, echo in 5/18 showed EF back up to 45%.  Echo in 5/19 showed mildly lower systolic function, EF 123456. Most recent echo was done today and reviewed, EF up to 50-55%.  Currently, NYHA class II symptoms and does not appear significantly volume overloaded.  Medication titration has been limited by lightheadedness in the past though he is tolerating current regimen.     - Continue spironolactone 25 mg daily.  - Continue Entresto 24/26 bid. BMET today.      - Continue Toprol XL 25 mg bid.  - Given no ACS/chest pain and stabilization of EF, I think we can defer coronary angiography.  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  If he has any chest pain or another fall in EF, will need angiography.  2. Mechanical aortic valve: Continue warfarin and ASA 81. Valve looked ok on 11/20 echo. 3. Atrial fibrillation: Admitted 1/18 with atrial fibrillation with RVR and CHF.  EF down to 20-25%.  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  Need to keep him in NSR. DCCV during 2/18 admission and now s/p atrial fibrillation ablation.  He  is in NSR today.  -  He is now off amiodarone.   - Continue warfarin. CBC today.  4. Ascending aortic aneurysm: 4.5 cm  on 7/20 MRA chest.  Repeat MRA chest in 7/21.    Followup in 6 months.   [name] 05/28/2019

## 2019-05-28 NOTE — Patient Instructions (Signed)
NO medication changes today  Labs today We will only contact you if something comes back abnormal or we need to make some changes. Otherwise no news is good news!  Your physician recommends that you schedule a follow-up appointment in: 6 months with Dr Aundra Dubin, you will get a call to schedule this appointment.  At the [facility], you and your health needs are our priority. As part of our continuing mission to provide you with exceptional heart care, we have created designated Provider Care Teams. These Care Teams include your primary Cardiologist (physician) and Advanced Practice Providers (APPs- Physician Assistants and Nurse Practitioners) who all work together to provide you with the care you need, when you need it.   You may see any of the following providers on your designated Care Team at your next follow up: Marland Kitchen Dr Glori Bickers . Dr [name] . Darrick Grinder, NP . Lyda Jester, PA   Please be sure to bring in all your medications bottles to every appointment.

## 2019-06-03 ENCOUNTER — Encounter (HOSPITAL_COMMUNITY): Payer: Self-pay | Admitting: *Deleted

## 2019-06-09 ENCOUNTER — Other Ambulatory Visit: Payer: Self-pay

## 2019-06-09 ENCOUNTER — Other Ambulatory Visit (HOSPITAL_COMMUNITY): Payer: Self-pay | Admitting: *Deleted

## 2019-06-09 ENCOUNTER — Ambulatory Visit (HOSPITAL_COMMUNITY)
Admission: RE | Admit: 2019-06-09 | Discharge: 2019-06-09 | Disposition: A | Discharge status: Home / Self Care | Payer: Medicare Other | Source: Ambulatory Visit | Attending: Cardiology | Admitting: Cardiology

## 2019-06-09 DIAGNOSIS — I5032 Chronic diastolic (congestive) heart failure: Secondary | ICD-10-CM

## 2019-06-09 LAB — BASIC METABOLIC PANEL
Anion gap: 12 (ref 5–15)
BUN: 21 mg/dL (ref 8–23)
CO2: 24 mmol/L (ref 22–32)
Calcium: 9 mg/dL (ref 8.9–10.3)
Chloride: 104 mmol/L (ref 98–111)
Creatinine, Ser: 0.94 mg/dL (ref 0.61–1.24)
GFR calc Af Amer: >60 mL/min (ref >60)
GFR calc non Af Amer: >60 mL/min (ref >60)
Glucose, Bld: 103 mg/dL — ABNORMAL HIGH (ref 70–99)
Potassium: 4.6 mmol/L (ref 3.5–5.1)
Sodium: 140 mmol/L (ref 135–145)

## 2019-06-16 DIAGNOSIS — I4891 Unspecified atrial fibrillation: Secondary | ICD-10-CM | POA: Diagnosis not present

## 2019-06-16 DIAGNOSIS — Z7901 Long term (current) use of anticoagulants: Secondary | ICD-10-CM | POA: Diagnosis not present

## 2019-06-30 ENCOUNTER — Other Ambulatory Visit (HOSPITAL_COMMUNITY): Payer: Self-pay

## 2019-06-30 MED ORDER — METOPROLOL SUCCINATE ER 25 MG PO TB24: 25.0000 mg | ORAL_TABLET | Freq: Two times a day (BID) | ORAL | 2 refills | Status: DC

## 2019-07-16 DIAGNOSIS — M542 Cervicalgia: Secondary | ICD-10-CM | POA: Diagnosis not present

## 2019-07-17 ENCOUNTER — Other Ambulatory Visit (HOSPITAL_COMMUNITY): Payer: Self-pay

## 2019-07-17 MED ORDER — SPIRONOLACTONE 25 MG PO TABS: 25.0000 mg | ORAL_TABLET | Freq: Every day | ORAL | 2 refills | Status: DC

## 2019-07-24 ENCOUNTER — Other Ambulatory Visit (HOSPITAL_COMMUNITY): Payer: Self-pay

## 2019-07-24 DIAGNOSIS — M542 Cervicalgia: Secondary | ICD-10-CM | POA: Diagnosis not present

## 2019-07-24 DIAGNOSIS — Z23 Encounter for immunization: Secondary | ICD-10-CM | POA: Diagnosis not present

## 2019-07-24 DIAGNOSIS — Z7901 Long term (current) use of anticoagulants: Secondary | ICD-10-CM | POA: Diagnosis not present

## 2019-07-24 DIAGNOSIS — I480 Paroxysmal atrial fibrillation: Secondary | ICD-10-CM | POA: Diagnosis not present

## 2019-07-24 MED ORDER — ENTRESTO 24-26 MG PO TABS: 1.0000 | ORAL_TABLET | Freq: Two times a day (BID) | ORAL | 2 refills | Status: DC

## 2019-07-31 DIAGNOSIS — Z7901 Long term (current) use of anticoagulants: Secondary | ICD-10-CM | POA: Diagnosis not present

## 2019-07-31 DIAGNOSIS — Z952 Presence of prosthetic heart valve: Secondary | ICD-10-CM | POA: Diagnosis not present

## 2019-08-14 DIAGNOSIS — Z7901 Long term (current) use of anticoagulants: Secondary | ICD-10-CM | POA: Diagnosis not present

## 2019-08-14 DIAGNOSIS — Z952 Presence of prosthetic heart valve: Secondary | ICD-10-CM | POA: Diagnosis not present

## 2019-08-18 ENCOUNTER — Ambulatory Visit: Payer: Medicare Other | Attending: Internal Medicine

## 2019-08-18 DIAGNOSIS — Z23 Encounter for immunization: Secondary | ICD-10-CM | POA: Insufficient documentation

## 2019-08-18 NOTE — Progress Notes (Signed)
   Covid-19 Vaccination Clinic  Name:  Adrian Lambert    MRN: [id number] DOB: [birth year]  08/18/2019  [name] was observed post Covid-19 immunization for 30 minutes based on pre-vaccination screening without incidence. He was provided with Vaccine Information Sheet and instruction to access the V-Safe system.   [name] was instructed to call 911 with any severe reactions post vaccine: Marland Kitchen Difficulty breathing  . Swelling of your face and throat  . A fast heartbeat  . A bad rash all over your body  . Dizziness and weakness    Immunizations Administered    Name Date Dose VIS Date Route   Pfizer COVID-19 Vaccine 08/18/2019 11:53 AM 0.3 mL 07/04/2019 Intramuscular   Manufacturer: [place]   Lot: [id number]   [place]: [id number]

## 2019-09-08 ENCOUNTER — Ambulatory Visit: Payer: Medicare Other | Attending: Internal Medicine

## 2019-09-08 DIAGNOSIS — Z23 Encounter for immunization: Secondary | ICD-10-CM | POA: Insufficient documentation

## 2019-09-08 NOTE — Progress Notes (Signed)
   Covid-19 Vaccination Clinic  Name:  Adrian Lambert    MRN: [id number] DOB: 03/18/1941  09/08/2019  [name] was observed post Covid-19 immunization for 30 minutes based on pre-vaccination screening without incidence. He was provided with Vaccine Information Sheet and instruction to access the V-Safe system.   [name] was instructed to call 911 with any severe reactions post vaccine: Marland Kitchen Difficulty breathing  . Swelling of your face and throat  . A fast heartbeat  . A bad rash all over your body  . Dizziness and weakness    Immunizations Administered    Name Date Dose VIS Date Route   Pfizer COVID-19 Vaccine 09/08/2019 11:43 AM 0.3 mL 07/04/2019 Intramuscular   Manufacturer: [place]   Lot: [id number]   [place]: [id number]

## 2019-09-10 DIAGNOSIS — Z952 Presence of prosthetic heart valve: Secondary | ICD-10-CM | POA: Diagnosis not present

## 2019-09-10 DIAGNOSIS — Z7901 Long term (current) use of anticoagulants: Secondary | ICD-10-CM | POA: Diagnosis not present

## 2019-10-17 DIAGNOSIS — Z7901 Long term (current) use of anticoagulants: Secondary | ICD-10-CM | POA: Diagnosis not present

## 2019-10-17 DIAGNOSIS — Z952 Presence of prosthetic heart valve: Secondary | ICD-10-CM | POA: Diagnosis not present

## 2019-11-27 DIAGNOSIS — Z952 Presence of prosthetic heart valve: Secondary | ICD-10-CM | POA: Diagnosis not present

## 2019-11-27 DIAGNOSIS — Z7901 Long term (current) use of anticoagulants: Secondary | ICD-10-CM | POA: Diagnosis not present

## 2019-12-11 DIAGNOSIS — Z7901 Long term (current) use of anticoagulants: Secondary | ICD-10-CM | POA: Diagnosis not present

## 2019-12-11 DIAGNOSIS — Z952 Presence of prosthetic heart valve: Secondary | ICD-10-CM | POA: Diagnosis not present

## 2019-12-18 DIAGNOSIS — Z952 Presence of prosthetic heart valve: Secondary | ICD-10-CM | POA: Diagnosis not present

## 2019-12-18 DIAGNOSIS — Z7901 Long term (current) use of anticoagulants: Secondary | ICD-10-CM | POA: Diagnosis not present

## 2019-12-27 ENCOUNTER — Other Ambulatory Visit: Payer: Self-pay | Admitting: Hematology & Oncology

## 2019-12-27 DIAGNOSIS — D500 Iron deficiency anemia secondary to blood loss (chronic): Secondary | ICD-10-CM

## 2020-01-08 DIAGNOSIS — Z7901 Long term (current) use of anticoagulants: Secondary | ICD-10-CM | POA: Diagnosis not present

## 2020-01-08 DIAGNOSIS — Z952 Presence of prosthetic heart valve: Secondary | ICD-10-CM | POA: Diagnosis not present

## 2020-01-19 DIAGNOSIS — J069 Acute upper respiratory infection, unspecified: Secondary | ICD-10-CM | POA: Diagnosis not present

## 2020-01-29 DIAGNOSIS — Z7901 Long term (current) use of anticoagulants: Secondary | ICD-10-CM | POA: Diagnosis not present

## 2020-01-29 DIAGNOSIS — Z952 Presence of prosthetic heart valve: Secondary | ICD-10-CM | POA: Diagnosis not present

## 2020-02-19 DIAGNOSIS — I4891 Unspecified atrial fibrillation: Secondary | ICD-10-CM | POA: Diagnosis not present

## 2020-02-19 DIAGNOSIS — Z7901 Long term (current) use of anticoagulants: Secondary | ICD-10-CM | POA: Diagnosis not present

## 2020-03-18 DIAGNOSIS — Z7901 Long term (current) use of anticoagulants: Secondary | ICD-10-CM | POA: Diagnosis not present

## 2020-03-18 DIAGNOSIS — Z952 Presence of prosthetic heart valve: Secondary | ICD-10-CM | POA: Diagnosis not present

## 2020-03-23 ENCOUNTER — Other Ambulatory Visit (HOSPITAL_COMMUNITY): Payer: Self-pay | Admitting: Cardiology

## 2020-03-31 NOTE — Progress Notes (Signed)
PCP: Dr Rex Kras - Also manages INR Cardiology: Dr. Martinique  HF Cardiology: Dr Aundra Dubin   HPI: Adrian Lambert is a 79 y.o. male with a history of severe AS s/p St Jude mechanical AVR in 03/2003 on chronic coumadin, HTN, obesity, and mild LV dysfxn EF 40-45% on 01/2016 echo. Prior to admit in 1/18, he had a cough and dyspnea that was thought to be bronchitis so he was treated with abx and oral steroids. He was seen in cardiology office in 1/18 and was noted to be in rapid atrial fibrillation with significant volume overload.   Admitted 1/23 through 08/29/16 with marked volume overload, lower extremity edema, and atrial fibrillation with RVR.  Initial cardioversion failed and he was difficult to diurese. He was placed on milrinone for low output and diuresed with IV lasix with good response.  Weight came down considerably. Amiodarone was begun. Once fully diuresed, he had successful DC-CV on 08/28/16. Discharged on torsemide 60 mg daily. Discharge weight 212 pounds. Echo and TEE in 1/18 had shown fall in EF to around 25%.   He continued to lose weight as an outpatient.  He had a syncopal episode where he stood up from sitting and got lightheaded with transient loss of consciousness.  He had been having orthostatic symptoms.  We saw him in the office and cut back on torsemide from 60 mg daily down eventually to 20 mg daily.  Creatinine up some to 1.7 at that time.    At a prior appt, he was back in atrial fibrillation.  He was set up for DCCV in 3/18, but was in NSR when he arrived to short stay. He had atrial fibrillation ablation with Dr. Curt Bears in 5/18.   Echo was done in 5/18, EF up to 45% with mild diffuse hypokinesis, normal mechanical aortic valve, mildly decreased RV systolic function.    Echo was repeated in 5/19, EF 35-40%, diffuse hypokinesis, mildly decreased RV systolic function, stable mechanical aortic valve.   Echo was done today and reviewed, EF up to 50-55%, normal RV size and systolic  function, mechanical aortic valve looks ok, normal IVC, ascending aorta 4.2 cm.   Today he returns for HF follow up.Overall feeling fine. Mild shortness of breath with steps. Denies chest pain. Denies PND/Orthopnea. Appetite ok. No fever or chills. Weight at home has been stable. Taking all medications.   Labs (09/04/2016):  Hgb 15.9, K 5.0, Creatinine 1.77, digoxin 0.7, BNP 326  Labs (3/18): digoxin 0.8, K 4.8, creatinine 1.44, TSH normal, AST 54, ALT 49 Labs (4/18): K 4.3, creatinine 1.3 Labs (7/18): K 3.8, creatinine 0.9, hgb 11.9 Labs (11/18): K 4.9, creatinine 1.0, hgb 13.5 Labs (3/19): K 4.7,creatinine 0.99 Labs (5/19): K 4.6, creatinine 0.98 Labs (9/19): K 4.2, creatinine 0.91 Labs (12/19): K 4.6, creatinine 1.14 Labs (3/20): K 4.6, creatinine 0.98 Labs (7/20): creatinine 1.07   PMH: 1. Aortic stenosis: # 25 St Jude mechanical aortic valve in 9/14.   2. Chronic systolic CHF: Echo in 8/46 with EF 45-50%.  Admitted in 1/18 with afib with RVR, ?tachycardia-mediated cardiomyopathy.  - Echo (1/18) with EF 25-30%, stable mechanical aortic valve.  - TEE (1/18) with EF 20-25%, stable mechanical aortic valve, moderate Adrian, small PFO.  - Admission 1/18 with acute on chronic systolic CHF, required milrinone, extensively diuresed.  - Echo (5/18) with EF 45%, diffuse hypokinesis, normal RV size with mildly decreased systolic function, mechanical aortic valve with mean gradient 14 mmHg, ascending aorta 4.4 cm.  - Echo (5/19)  with EF 35-40%, diffuse hypokinesis, mild LVH, mildly decreased RV systolic function, mechanical aortic valve looked ok.   - Echo (11/20) with EF up to 50-55%, normal RV size and systolic function, mechanical aortic valve looks ok, normal IVC, ascending aorta 4.2 cm.  3. Atrial fibrillation: First noted in 1/61, uncertain how long it had been present (with RVR).  08/28/2016 Successful DC-CV --> NSR.  Back in atrial fibrillation at 09/18/16 office visit.  Set up for DCCV in 3/18  but back in NSR.   - Atrial fibrillation ablation in 5/18. Now off amiodarone.  4. 09/04/2016 Syncope--> over-diuresis most likely.  5. HTN 6. Ascending aortic aneurysm: 4.4 cm on 7/17 echo, 4.4 cm on 5/18 echo.  - MRA chest (4/19): 4.5 cm ascending aortic aneurysm.  - MRA chest (7/20): 4.5 cm ascending aortic aneurysm.  7. Prostate cancer: Treated with radioactive seed implantation.  8. Nephrolithiasis.  9. PFTs (4/18): Mild restriction.   ROS: All systems negative except as listed in HPI, PMH and Problem List.  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  Tobacco Use   Smoking status: Never Smoker   Smokeless tobacco: Never Used  Scientific laboratory technician Use: Never used  Substance and Sexual Activity   Alcohol use: Yes    Alcohol/week: 3.0 standard drinks    Types: 3 Cans of beer per week    Comment:  "may have 2-3 beers on the weekend"   Drug use: No   Sexual activity: Not on file  Other Topics Concern   Not on file  Social History Narrative   Not on file   Social Determinants of Health   Financial Resource Strain:    Difficulty of Paying Living Expenses: Not on file  Food Insecurity:    Worried About [place] in the Last Year: Not on file   Ran Out of Food in the Last Year: Not on file  Transportation Needs:    Lack of Transportation (Medical): Not on file   Lack of Transportation (Non-Medical): Not on file  Physical Activity:    Days of Exercise per Week: Not on file   Minutes of Exercise per Session: Not on file  Stress:    Feeling of Stress : Not on file  Social Connections:    Frequency of Communication with Friends and Family: Not on file   Frequency of Social Gatherings with Friends and Family: Not on  file   Attends Religious Services: Not on file   Active Member of Clubs or Organizations: Not on file   Attends Archivist Meetings: Not on file   Marital Status: Not on file  Intimate Partner Violence:    Fear of Current or Ex-Partner: Not on file   Emotionally Abused: Not on file   Physically Abused: Not on file   Sexually Abused: Not on file    FH:  Family History  Problem Relation Age of Onset   Heart failure Mother    Diabetes Mother    Prostate cancer Father     Current Outpatient Medications  Medication Sig Dispense Refill   allopurinol (ZYLOPRIM) 300 MG tablet Take 300 mg by mouth daily.      aspirin 81  MG chewable tablet Chew 1 tablet (81 mg total) by mouth daily. 30 tablet 6   colchicine 0.6 MG tablet Take 0.6 mg by mouth daily as needed (for gout).      folic acid (FOLVITE) 1 MG tablet TAKE 1 TABLET(1 MG) BY MOUTH DAILY 90 tablet 3   metoprolol succinate (TOPROL-XL) 25 MG 24 hr tablet Take 1 tablet (25 mg total) by mouth in the morning and at bedtime. Needs appt 180 tablet 0   Psyllium (METAMUCIL PO) Take 2 capsules by mouth 2 (two) times daily.      sacubitril-valsartan (ENTRESTO) 24-26 MG Take 1 tablet by mouth 2 (two) times daily. 180 tablet 2   spironolactone (ALDACTONE) 25 MG tablet Take 1 tablet (25 mg total) by mouth daily. 90 tablet 2   tamsulosin (FLOMAX) 0.4 MG CAPS capsule Take 0.4 mg by mouth every other day.      tolterodine (DETROL LA) 4 MG 24 hr capsule Take 4 mg by mouth every other day.   0   torsemide (DEMADEX) 20 MG tablet Take 1 tablet by mouth daily as needed.  3   warfarin (COUMADIN) 5 MG tablet Take 5 mg by mouth as directed. 1/2 Tablet(2.5mg ) Monday, Wednesday, Friday. 1 tablet( 5mg )Tuesday, Thursday, Saturday, Sunday.     No current facility-administered medications for this encounter.    Vitals:   04/01/20 0906  BP: 132/76  Pulse: 64  SpO2: 97%  Weight: 90.7 kg (200 lb)   Filed Weights   04/01/20 0906    Weight: 90.7 kg (200 lb)   PHYSICAL EXAM: General:  Well appearing. No resp difficulty. Walked in the clinic.  HEENT: normal Neck: supple. no JVD. Carotids 2+ bilat; no bruits. No lymphadenopathy or thryomegaly appreciated. Cor: PMI nondisplaced. Regular rate & rhythm. No rubs, gallops. Mechanical S2.  Lungs: clear Abdomen: soft, nontender, nondistended. No hepatosplenomegaly. No bruits or masses. Good bowel sounds. Extremities: no cyanosis, clubbing, rash, edema Neuro: alert & orientedx3, cranial nerves grossly intact. moves all 4 extremities w/o difficulty. Affect pleasant  EKG: SR 1st degree heart block 226 ms   ASSESSMENT & PLAN: 1. Chronic systolic CHF: EF on TEE in 1/18 20-25%, mechanical aortic valve ok. 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  He required milrinone to assist diuresis during 1/18-2/18 admission.  Now that he is back in NSR s/p atrial fibrillation ablation, echo in 5/18 showed EF back up to 45%.  Echo in 5/19 showed mildly lower systolic function, EF 75-91%. Most recent echo 2020, EF up to 50-55%.   -NYHA  II. Volume status stable.   - Continue spironolactone 25 mg daily.  - Continue Entresto 24/26 bid. BMET today.      - Continue Toprol XL 25 mg bid.  - Given no ACS/chest pain and stabilization of EF, defer coronary angiography.  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  If he has any chest pain or another fall in EF, will need angiography.  2. Mechanical aortic valve: Continue warfarin and ASA 81. Valve looked ok on 11/20 echo. INR managed by his PCP.  3. Atrial fibrillation: Admitted 1/18 with atrial fibrillation with RVR and CHF.  EF down to 20-25%.  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  Need to keep him in NSR. DCCV during 2/18 admission and now s/p atrial fibrillation ablation.  In Sinus Rhythm today.   -  He is now off amiodarone.   - Continue warfarin.  4. Ascending aortic aneurysm: 4.5 cm on 7/20 MRA chest.  Repeat MRA chest in 7/21.    Follow up in 3-4 months with Dr Aundra Dubin and an ECHO. Check BMET today.   Woodfin Kiss NP-C  04/01/2020

## 2020-04-01 ENCOUNTER — Other Ambulatory Visit: Payer: Self-pay

## 2020-04-01 ENCOUNTER — Ambulatory Visit (HOSPITAL_COMMUNITY)
Admission: RE | Admit: 2020-04-01 | Discharge: 2020-04-01 | Disposition: A | Discharge status: Home / Self Care | Payer: Medicare Other | Source: Ambulatory Visit | Attending: Adult Health | Admitting: Adult Health

## 2020-04-01 ENCOUNTER — Encounter (HOSPITAL_COMMUNITY): Payer: Self-pay

## 2020-04-01 VITALS — BP 132/76 | HR 64 | Wt 200.0 lb

## 2020-04-01 DIAGNOSIS — Z952 Presence of prosthetic heart valve: Secondary | ICD-10-CM | POA: Insufficient documentation

## 2020-04-01 DIAGNOSIS — Z8042 Family history of malignant neoplasm of prostate: Secondary | ICD-10-CM | POA: Diagnosis not present

## 2020-04-01 DIAGNOSIS — Z7982 Long term (current) use of aspirin: Secondary | ICD-10-CM | POA: Insufficient documentation

## 2020-04-01 DIAGNOSIS — Z7901 Long term (current) use of anticoagulants: Secondary | ICD-10-CM | POA: Diagnosis not present

## 2020-04-01 DIAGNOSIS — I480 Paroxysmal atrial fibrillation: Secondary | ICD-10-CM | POA: Diagnosis not present

## 2020-04-01 DIAGNOSIS — Z8249 Family history of ischemic heart disease and other diseases of the circulatory system: Secondary | ICD-10-CM | POA: Diagnosis not present

## 2020-04-01 DIAGNOSIS — I5022 Chronic systolic (congestive) heart failure: Secondary | ICD-10-CM | POA: Diagnosis not present

## 2020-04-01 DIAGNOSIS — I5042 Chronic combined systolic (congestive) and diastolic (congestive) heart failure: Secondary | ICD-10-CM

## 2020-04-01 DIAGNOSIS — I350 Nonrheumatic aortic (valve) stenosis: Secondary | ICD-10-CM | POA: Diagnosis not present

## 2020-04-01 DIAGNOSIS — Z8546 Personal history of malignant neoplasm of prostate: Secondary | ICD-10-CM | POA: Diagnosis not present

## 2020-04-01 DIAGNOSIS — Z79899 Other long term (current) drug therapy: Secondary | ICD-10-CM | POA: Diagnosis not present

## 2020-04-01 DIAGNOSIS — I712 Thoracic aortic aneurysm, without rupture: Secondary | ICD-10-CM | POA: Diagnosis not present

## 2020-04-01 DIAGNOSIS — I110 Hypertensive heart disease with heart failure: Secondary | ICD-10-CM | POA: Diagnosis not present

## 2020-04-01 LAB — BASIC METABOLIC PANEL
Anion gap: 11 (ref 5–15)
BUN: 21 mg/dL (ref 8–23)
CO2: 20 mmol/L — ABNORMAL LOW (ref 22–32)
Calcium: 9 mg/dL (ref 8.9–10.3)
Chloride: 106 mmol/L (ref 98–111)
Creatinine, Ser: 1.13 mg/dL (ref 0.61–1.24)
GFR calc Af Amer: >60 mL/min (ref >60)
GFR calc non Af Amer: >60 mL/min (ref >60)
Glucose, Bld: 102 mg/dL — ABNORMAL HIGH (ref 70–99)
Potassium: 5 mmol/L (ref 3.5–5.1)
Sodium: 137 mmol/L (ref 135–145)

## 2020-04-01 NOTE — Patient Instructions (Signed)
It was great to see you today! No medication changes are needed at this time.   Labs today We will only contact you if something comes back abnormal or we need to make some changes. Otherwise no news is good news!  Marland KitchenYour physician recommends that you schedule a follow-up appointment in: 3-4 months with Dr Aundra Dubin  Your physician has requested that you have an echocardiogram. Echocardiography is a painless test that uses sound waves to create images of your heart. It provides your doctor with information about the size and shape of your heart and how well your heart's chambers and valves are working. This procedure takes approximately one hour. There are no restrictions for this procedure.  If you have any questions or concerns before your next appointment please send [place] a message through [place] or call our office at [phone].    TO LEAVE A MESSAGE FOR THE NURSE SELECT OPTION 2, PLEASE LEAVE A MESSAGE INCLUDING: . YOUR NAME . DATE OF BIRTH . CALL BACK NUMBER . REASON FOR CALL**this is important as we prioritize the call backs  YOU WILL RECEIVE A CALL BACK THE SAME DAY AS LONG AS YOU CALL BEFORE 4:00 PM

## 2020-04-08 ENCOUNTER — Other Ambulatory Visit (HOSPITAL_COMMUNITY): Payer: Self-pay | Admitting: Cardiology

## 2020-04-15 DIAGNOSIS — Z7901 Long term (current) use of anticoagulants: Secondary | ICD-10-CM | POA: Diagnosis not present

## 2020-04-15 DIAGNOSIS — Z952 Presence of prosthetic heart valve: Secondary | ICD-10-CM | POA: Diagnosis not present

## 2020-04-16 NOTE — Addendum Note (Signed)
Encounter addended by: Harvie Junior, CMA on: 04/16/2020 9:40 AM  Actions taken: Charge Capture section accepted

## 2020-04-18 ENCOUNTER — Other Ambulatory Visit (HOSPITAL_COMMUNITY): Payer: Self-pay | Admitting: Cardiology

## 2020-05-11 ENCOUNTER — Ambulatory Visit: Payer: Medicare Other | Attending: Internal Medicine

## 2020-05-11 ENCOUNTER — Other Ambulatory Visit (HOSPITAL_BASED_OUTPATIENT_CLINIC_OR_DEPARTMENT_OTHER): Payer: Self-pay | Admitting: Internal Medicine

## 2020-05-11 DIAGNOSIS — Z23 Encounter for immunization: Secondary | ICD-10-CM

## 2020-05-11 NOTE — Progress Notes (Signed)
   Covid-19 Vaccination Clinic  Name:  MAHMOUD BLAZEJEWSKI    MRN: [id number] DOB: 1941-06-21  05/11/2020  Mr. Blankenbaker was observed post Covid-19 immunization for 15 minutes without incident. He was provided with Vaccine Information Sheet and instruction to access the V-Safe system.   Mr. Klinger was instructed to call 911 with any severe reactions post vaccine: Marland Kitchen Difficulty breathing  . Swelling of face and throat  . A fast heartbeat  . A bad rash all over body  . Dizziness and weakness

## 2020-05-17 DIAGNOSIS — L821 Other seborrheic keratosis: Secondary | ICD-10-CM | POA: Diagnosis not present

## 2020-05-17 DIAGNOSIS — Z85820 Personal history of malignant melanoma of skin: Secondary | ICD-10-CM | POA: Diagnosis not present

## 2020-05-17 DIAGNOSIS — C61 Malignant neoplasm of prostate: Secondary | ICD-10-CM | POA: Diagnosis not present

## 2020-05-17 DIAGNOSIS — Z85828 Personal history of other malignant neoplasm of skin: Secondary | ICD-10-CM | POA: Diagnosis not present

## 2020-05-17 DIAGNOSIS — L814 Other melanin hyperpigmentation: Secondary | ICD-10-CM | POA: Diagnosis not present

## 2020-05-18 MED FILL — PFIZER-BIONTECH COVID-19 VA: 30 | 1 days supply | Qty: 0 | Fill #0

## 2020-05-19 DIAGNOSIS — I255 Ischemic cardiomyopathy: Secondary | ICD-10-CM | POA: Diagnosis not present

## 2020-05-19 DIAGNOSIS — I10 Essential (primary) hypertension: Secondary | ICD-10-CM | POA: Diagnosis not present

## 2020-05-19 DIAGNOSIS — Z0000 Encounter for general adult medical examination without abnormal findings: Secondary | ICD-10-CM | POA: Diagnosis not present

## 2020-05-19 DIAGNOSIS — I480 Paroxysmal atrial fibrillation: Secondary | ICD-10-CM | POA: Diagnosis not present

## 2020-05-24 DIAGNOSIS — N401 Enlarged prostate with lower urinary tract symptoms: Secondary | ICD-10-CM | POA: Diagnosis not present

## 2020-05-24 DIAGNOSIS — R351 Nocturia: Secondary | ICD-10-CM | POA: Diagnosis not present

## 2020-05-24 DIAGNOSIS — N3281 Overactive bladder: Secondary | ICD-10-CM | POA: Diagnosis not present

## 2020-05-24 DIAGNOSIS — Z8546 Personal history of malignant neoplasm of prostate: Secondary | ICD-10-CM | POA: Diagnosis not present

## 2020-05-26 DIAGNOSIS — M1711 Unilateral primary osteoarthritis, right knee: Secondary | ICD-10-CM | POA: Diagnosis not present

## 2020-05-26 DIAGNOSIS — M1611 Unilateral primary osteoarthritis, right hip: Secondary | ICD-10-CM | POA: Diagnosis not present

## 2020-06-23 DIAGNOSIS — M1711 Unilateral primary osteoarthritis, right knee: Secondary | ICD-10-CM | POA: Diagnosis not present

## 2020-06-24 DIAGNOSIS — Z7901 Long term (current) use of anticoagulants: Secondary | ICD-10-CM | POA: Diagnosis not present

## 2020-06-30 DIAGNOSIS — M25561 Pain in right knee: Secondary | ICD-10-CM | POA: Diagnosis not present

## 2020-06-30 DIAGNOSIS — M1711 Unilateral primary osteoarthritis, right knee: Secondary | ICD-10-CM | POA: Diagnosis not present

## 2020-07-08 ENCOUNTER — Encounter (HOSPITAL_COMMUNITY): Payer: Self-pay | Admitting: Cardiology

## 2020-07-08 ENCOUNTER — Ambulatory Visit (HOSPITAL_COMMUNITY)
Admission: RE | Admit: 2020-07-08 | Discharge: 2020-07-08 | Disposition: A | Discharge status: Home / Self Care | Payer: Medicare Other | Source: Ambulatory Visit | Attending: Family Medicine | Admitting: Family Medicine

## 2020-07-08 ENCOUNTER — Other Ambulatory Visit: Payer: Self-pay

## 2020-07-08 ENCOUNTER — Ambulatory Visit (HOSPITAL_BASED_OUTPATIENT_CLINIC_OR_DEPARTMENT_OTHER)
Admission: RE | Admit: 2020-07-08 | Discharge: 2020-07-08 | Disposition: A | Discharge status: Home / Self Care | Payer: Medicare Other | Source: Ambulatory Visit | Attending: Cardiology | Admitting: Cardiology

## 2020-07-08 VITALS — BP 118/70 | HR 66 | Wt 199.6 lb

## 2020-07-08 DIAGNOSIS — E7849 Other hyperlipidemia: Secondary | ICD-10-CM | POA: Diagnosis not present

## 2020-07-08 DIAGNOSIS — I714 Abdominal aortic aneurysm, without rupture, unspecified: Secondary | ICD-10-CM

## 2020-07-08 DIAGNOSIS — I5022 Chronic systolic (congestive) heart failure: Secondary | ICD-10-CM

## 2020-07-08 DIAGNOSIS — I110 Hypertensive heart disease with heart failure: Secondary | ICD-10-CM | POA: Insufficient documentation

## 2020-07-08 DIAGNOSIS — Z952 Presence of prosthetic heart valve: Secondary | ICD-10-CM | POA: Diagnosis not present

## 2020-07-08 DIAGNOSIS — I5042 Chronic combined systolic (congestive) and diastolic (congestive) heart failure: Secondary | ICD-10-CM | POA: Diagnosis not present

## 2020-07-08 DIAGNOSIS — I350 Nonrheumatic aortic (valve) stenosis: Secondary | ICD-10-CM | POA: Insufficient documentation

## 2020-07-08 LAB — LIPID PANEL
Cholesterol: 139 mg/dL (ref 0–200)
HDL: 42 mg/dL (ref >40)
LDL Cholesterol: 77 mg/dL (ref 0–99)
Total CHOL/HDL Ratio: 3.3 RATIO
Triglycerides: 101 mg/dL (ref <150)
VLDL: 20 mg/dL (ref 0–40)

## 2020-07-08 LAB — ECHOCARDIOGRAM COMPLETE
AR max vel: 1.42 cm2
AV Area VTI: 1.48 cm2
AV Area mean vel: 1.24 cm2
AV Mean grad: 10 mmHg
AV Peak grad: 15.4 mmHg
Ao pk vel: 1.96 m/s
Area-P 1/2: 3.65 cm2
S' Lateral: 3 cm

## 2020-07-08 LAB — BASIC METABOLIC PANEL
Anion gap: 11 (ref 5–15)
BUN: 25 mg/dL — ABNORMAL HIGH (ref 8–23)
CO2: 24 mmol/L (ref 22–32)
Calcium: 9.3 mg/dL (ref 8.9–10.3)
Chloride: 104 mmol/L (ref 98–111)
Creatinine, Ser: 0.97 mg/dL (ref 0.61–1.24)
GFR, Estimated: >60 mL/min (ref >60)
Glucose, Bld: 101 mg/dL — ABNORMAL HIGH (ref 70–99)
Potassium: 4.8 mmol/L (ref 3.5–5.1)
Sodium: 139 mmol/L (ref 135–145)

## 2020-07-08 LAB — CBC
HCT: 41.5 % (ref 39.0–52.0)
Hemoglobin: 13.4 g/dL (ref 13.0–17.0)
MCH: 20.2 pg — ABNORMAL LOW (ref 26.0–34.0)
MCHC: 32.3 g/dL (ref 30.0–36.0)
MCV: 62.6 fL — ABNORMAL LOW (ref 80.0–100.0)
Platelets: 148 10*3/uL — ABNORMAL LOW (ref 150–400)
RBC: 6.63 MIL/uL — ABNORMAL HIGH (ref 4.22–5.81)
RDW: 18.7 % — ABNORMAL HIGH (ref 11.5–15.5)
WBC: 10.6 10*3/uL — ABNORMAL HIGH (ref 4.0–10.5)
nRBC: 0 % (ref 0.0–0.2)

## 2020-07-08 NOTE — Progress Notes (Signed)
*  PRELIMINARY RESULTS* Echocardiogram 2D Echocardiogram has been performed.  Adrian Lambert 07/08/2020, 10:28 AM

## 2020-07-08 NOTE — Patient Instructions (Signed)
Labs done today, your results will be available in MyChart, we will contact you for abnormal readings.  MRI of chest is needed, once approved by insurance company we will contact you to schedule  Please call our office in May 2022 to schedule your follow up appointment  If you have any questions or concerns before your next appointment please send [place] a message through [place] or call our office at [phone].    TO LEAVE A MESSAGE FOR THE NURSE SELECT OPTION 2, PLEASE LEAVE A MESSAGE INCLUDING: . YOUR NAME . DATE OF BIRTH . CALL BACK NUMBER . REASON FOR CALL**this is important as we prioritize the call backs  [place] AS LONG AS YOU CALL BEFORE 4:00 PM  At the [facility], you and your health needs are our priority. As part of our continuing mission to provide you with exceptional heart care, we have created designated Provider Care Teams. These Care Teams include your primary Cardiologist (physician) and Advanced Practice Providers (APPs- Physician Assistants and Nurse Practitioners) who all work together to provide you with the care you need, when you need it.   You may see any of the following providers on your designated Care Team at your next follow up: Marland Kitchen Dr Glori Bickers . Dr [name] . Darrick Grinder, NP . Lyda Jester, PA . Audry Riles, PharmD   Please be sure to bring in all your medications bottles to every appointment.

## 2020-07-09 DIAGNOSIS — M1711 Unilateral primary osteoarthritis, right knee: Secondary | ICD-10-CM | POA: Diagnosis not present

## 2020-07-09 NOTE — Progress Notes (Signed)
PCP: Dr Rex Kras - Also manages INR Cardiology: Dr. Martinique  HF Cardiology: Dr Aundra Dubin   HPI: Adrian Lambert is a 79 y.o. male with a history of severe AS s/p St Jude mechanical AVR in 03/2003 on chronic coumadin, HTN, obesity, and mild LV dysfxn EF 40-45% on 01/2016 echo. Prior to admit in 1/18, he had a cough and dyspnea that was thought to be bronchitis so he was treated with abx and oral steroids. He was seen in cardiology office in 1/18 and was noted to be in rapid atrial fibrillation with significant volume overload.   Admitted 1/23 through 08/29/16 with marked volume overload, lower extremity edema, and atrial fibrillation with RVR.  Initial cardioversion failed and he was difficult to diurese. He was placed on milrinone for low output and diuresed with IV lasix with good response.  Weight came down considerably. Amiodarone was begun. Once fully diuresed, he had successful DC-CV on 08/28/16. Discharged on torsemide 60 mg daily. Discharge weight 212 pounds. Echo and TEE in 1/18 had shown fall in EF to around 25%.   He continued to lose weight as an outpatient.  He had a syncopal episode where he stood up from sitting and got lightheaded with transient loss of consciousness.  He had been having orthostatic symptoms.  We saw him in the office and cut back on torsemide from 60 mg daily down eventually to 20 mg daily.  Creatinine up some to 1.7 at that time.    At a prior appt, he was back in atrial fibrillation.  He was set up for DCCV in 3/18, but was in NSR when he arrived to short stay. He had atrial fibrillation ablation with Dr. Curt Bears in 5/18.   Echo was done in 5/18, EF up to 45% with mild diffuse hypokinesis, normal mechanical aortic valve, mildly decreased RV systolic function.    Echo was repeated in 5/19, EF 35-40%, diffuse hypokinesis, mildly decreased RV systolic function, stable mechanical aortic valve.   Echo in 11/21 showed EF up to 50-55%, normal RV size and systolic function, mechanical  aortic valve looks ok, normal IVC, ascending aorta 4.2 cm. Echo in 12/21 showed EF 60-65%, mild RV enlargement, normal RV function, mechanical aortic valve mean gradient 10 mmHg, ascending aorta 4.1 cm.   He returns for followup of CHF.  Generally doing well.  He has not had to take torsemide.  Still has some difficulty with balance but no falls.  No lightheadedness.  No exertional dyspnea or chest pain.  No BRPBR/melena. Weight down 1 lb.   ECG (personally reviewed): NSR, 1st degree AVB, RBBB, LAFB, PVC  Labs (09/04/2016):  Hgb 15.9, K 5.0, Creatinine 1.77, digoxin 0.7, BNP 326  Labs (3/18): digoxin 0.8, K 4.8, creatinine 1.44, TSH normal, AST 54, ALT 49 Labs (4/18): K 4.3, creatinine 1.3 Labs (7/18): K 3.8, creatinine 0.9, hgb 11.9 Labs (11/18): K 4.9, creatinine 1.0, hgb 13.5 Labs (3/19): K 4.7,creatinine 0.99 Labs (5/19): K 4.6, creatinine 0.98 Labs (9/19): K 4.2, creatinine 0.91 Labs (12/19): K 4.6, creatinine 1.14 Labs (3/20): K 4.6, creatinine 0.98 Labs (7/20): creatinine 1.07 Labs (9/21): K 5, creatinine 1.13  PMH: 1. Aortic stenosis: # 25 St Jude mechanical aortic valve in 9/14.   2. Chronic systolic CHF: Echo in 4/23 with EF 45-50%.  Admitted in 1/18 with afib with RVR, ?tachycardia-mediated cardiomyopathy.  - Echo (1/18) with EF 25-30%, stable mechanical aortic valve.  - TEE (1/18) with EF 20-25%, stable mechanical aortic valve, moderate Adrian, small PFO.  -  Admission 1/18 with acute on chronic systolic CHF, required milrinone, extensively diuresed.  - Echo (5/18) with EF 45%, diffuse hypokinesis, normal RV size with mildly decreased systolic function, mechanical aortic valve with mean gradient 14 mmHg, ascending aorta 4.4 cm.  - Echo (5/19) with EF 35-40%, diffuse hypokinesis, mild LVH, mildly decreased RV systolic function, mechanical aortic valve looked ok.   - Echo (11/20) with EF up to 50-55%, normal RV size and systolic function, mechanical aortic valve looks ok, normal IVC,  ascending aorta 4.2 cm.  - Echo (12/21) with EF 60-65%, mild RV enlargement, normal RV function, mechanical aortic valve mean gradient 10 mmHg, ascending aorta 4.1 cm. 3. Atrial fibrillation: First noted in 3/00, uncertain how long it had been present (with RVR).  08/28/2016 Successful DC-CV --> NSR.  Back in atrial fibrillation at 09/18/16 office visit.  Set up for DCCV in 3/18 but back in NSR.   - Atrial fibrillation ablation in 5/18. Now off amiodarone.  4. 09/04/2016 Syncope--> over-diuresis most likely.  5. HTN 6. Ascending aortic aneurysm: 4.4 cm on 7/17 echo, 4.4 cm on 5/18 echo.  - MRA chest (4/19): 4.5 cm ascending aortic aneurysm.  - MRA chest (7/20): 4.5 cm ascending aortic aneurysm.  7. Prostate cancer: Treated with radioactive seed implantation.  8. Nephrolithiasis.  9. PFTs (4/18): Mild restriction.   ROS: All systems negative except as listed in HPI, PMH and Problem List.  SH:  Social History   Socioeconomic History  . Marital status: Married    Spouse name: Not on file  . Number of children: 4  . Years of education: Not on file  . Highest education level: Not on file  Occupational History  . 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 Smoking status: Never Smoker  . Smokeless tobacco: Never Used  Vaping Use  . Vaping Use: Never used  Substance and Sexual Activity  . Alcohol use: Yes    Alcohol/week: 3.0 standard drinks    Types: 3 Cans of beer per week    Comment:  "may have 2-3 beers on the weekend"  . Drug use: No  . Sexual activity: Not on file  Other Topics Concern  . Not on file  Social History Narrative  . Not on file   Social Determinants of Health   Financial Resource Strain: Not on file  Food Insecurity: Not on file  Transportation Needs: Not on file  Physical Activity: Not on file  Stress: Not on file  Social Connections: Not on file  Intimate Partner Violence: Not on file    FH:  Family History  Problem Relation Age of  Onset  . Heart failure Mother   . Diabetes Mother   . Prostate cancer Father     Current Outpatient Medications  Medication Sig Dispense Refill  . allopurinol (ZYLOPRIM) 300 MG tablet Take 300 mg by mouth daily.    Marland Kitchen aspirin 81 MG chewable tablet Chew 1 tablet (81 mg total) by mouth daily. 30 tablet 6  . colchicine 0.6 MG tablet Take 0.6 mg by mouth daily as needed (for gout).     Marland Kitchen ENTRESTO 24-26 MG TAKE 1 TABLET BY MOUTH TWICE DAILY 923 tablet 3  . folic acid (FOLVITE) 1 MG tablet TAKE 1 TABLET(1 MG) BY MOUTH DAILY 90 tablet 3  . metoprolol succinate (TOPROL-XL) 25 MG 24 hr tablet Take 1 tablet (25 mg total) by mouth in the morning and at bedtime. Needs appt 180 tablet 0  .  Psyllium (METAMUCIL PO) Take 2 capsules by mouth 2 (two) times daily.     Marland Kitchen spironolactone (ALDACTONE) 25 MG tablet Take 12.5 mg by mouth daily.    . tamsulosin (FLOMAX) 0.4 MG CAPS capsule Take 0.4 mg by mouth every other day.     . tolterodine (DETROL LA) 4 MG 24 hr capsule Take 4 mg by mouth every other day.   0  . torsemide (DEMADEX) 20 MG tablet Take 1 tablet by mouth daily as needed.  3  . warfarin (COUMADIN) 5 MG tablet Take 5 mg by mouth as directed. 1/2 Tablet(2.5mg ) Monday, Wednesday, Friday. 1 tablet( 5mg )Tuesday, Thursday, Saturday, Sunday.     No current facility-administered medications for this encounter.    Vitals:   07/08/20 1136  BP: 118/70  Pulse: 66  SpO2: 98%  Weight: 90.5 kg (199 lb 9.6 oz)   Filed Weights   07/08/20 1136  Weight: 90.5 kg (199 lb 9.6 oz)   PHYSICAL EXAM: General: NAD Neck: No JVD, no thyromegaly or thyroid nodule.  Lungs: Clear to auscultation bilaterally with normal respiratory effort. CV: Nondisplaced PMI.  Heart regular S1/S2 with mechanical S2, no S3/S4, 1/6 SEM RUSB.  Trace ankle edema.  No carotid bruit.  Normal pedal pulses.  Abdomen: Soft, nontender, no hepatosplenomegaly, no distention.  Skin: Intact without lesions or rashes.  Neurologic: Alert and  oriented x 3.  Psych: Normal affect. Extremities: No clubbing or cyanosis.  HEENT: Normal.   ASSESSMENT & PLAN: 1. Chronic systolic CHF: EF on TEE in 1/18 20-25%, mechanical aortic valve ok. 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  He required milrinone to assist diuresis during 1/18-2/18 admission.  Now that he is back in NSR s/p atrial fibrillation ablation, echo in 5/18 showed EF back up to 45%.  Echo in 5/19 showed mildly lower systolic function, EF 12-45%. Most recent echo in 12/21 showed EF up to 60-65%.  Currently, NYHA class II symptoms and does not appear significantly volume overloaded.  Medication titration has been limited by lightheadedness in the past though he is tolerating current regimen.     - Continue spironolactone 12.5 mg daily.  - Continue Entresto 24/26 bid. BMET today.      - Continue Toprol XL 25 mg bid.  - Given no ACS/chest pain and stabilization of EF, I think we can defer coronary angiography.  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  If he has any chest pain or another fall in EF, will need angiography.  2. Mechanical aortic valve: Continue warfarin and ASA 81. Valve looked ok on 12/21 echo. 3. Atrial fibrillation: Admitted 1/18 with atrial fibrillation with RVR and CHF.  EF down to 20-25%.  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  Need to keep him in NSR. DCCV during 2/18 admission and now s/p atrial fibrillation ablation.  He is in NSR today.  -  He is now off amiodarone.   - Continue warfarin. CBC today.  4. Ascending aortic aneurysm: 4.5 cm on 7/20 MRA chest.  He needs repeat MRA chest, will arrange.   Followup in 6 months.   [name] 07/09/2020

## 2020-07-20 DIAGNOSIS — M25561 Pain in right knee: Secondary | ICD-10-CM | POA: Diagnosis not present

## 2020-07-20 DIAGNOSIS — M1711 Unilateral primary osteoarthritis, right knee: Secondary | ICD-10-CM | POA: Diagnosis not present

## 2020-07-22 DIAGNOSIS — D6869 Other thrombophilia: Secondary | ICD-10-CM | POA: Diagnosis not present

## 2020-07-22 DIAGNOSIS — I480 Paroxysmal atrial fibrillation: Secondary | ICD-10-CM | POA: Diagnosis not present

## 2020-07-22 DIAGNOSIS — Z952 Presence of prosthetic heart valve: Secondary | ICD-10-CM | POA: Diagnosis not present

## 2020-07-22 DIAGNOSIS — Z7901 Long term (current) use of anticoagulants: Secondary | ICD-10-CM | POA: Diagnosis not present

## 2020-07-26 ENCOUNTER — Ambulatory Visit (HOSPITAL_COMMUNITY)
Admission: RE | Admit: 2020-07-26 | Discharge: 2020-07-26 | Disposition: A | Discharge status: Home / Self Care | Payer: Medicare Other | Source: Ambulatory Visit | Attending: Cardiology | Admitting: Cardiology

## 2020-07-26 ENCOUNTER — Other Ambulatory Visit: Payer: Self-pay

## 2020-07-26 DIAGNOSIS — I714 Abdominal aortic aneurysm, without rupture, unspecified: Secondary | ICD-10-CM

## 2020-07-26 DIAGNOSIS — I712 Thoracic aortic aneurysm, without rupture: Secondary | ICD-10-CM | POA: Diagnosis not present

## 2020-07-26 DIAGNOSIS — Z952 Presence of prosthetic heart valve: Secondary | ICD-10-CM | POA: Diagnosis not present

## 2020-07-26 MED ORDER — GADOBUTROL 1 MMOL/ML IV SOLN
10.0000 mL | Freq: Once | INTRAVENOUS | Status: AC | PRN
  Administered 2020-07-26: 10 mL via INTRAVENOUS

## 2020-07-28 ENCOUNTER — Telehealth (HOSPITAL_COMMUNITY): Payer: Self-pay | Admitting: Cardiology

## 2020-07-28 ENCOUNTER — Other Ambulatory Visit (HOSPITAL_COMMUNITY): Payer: Self-pay | Admitting: *Deleted

## 2020-07-28 MED ORDER — METOPROLOL SUCCINATE ER 25 MG PO TB24: 25.0000 mg | ORAL_TABLET | Freq: Two times a day (BID) | ORAL | 3 refills | Status: DC

## 2020-07-28 NOTE — Telephone Encounter (Signed)
Pt request metoprolo succinate refill, please send script to [facility], [address], pt can be reached on cell [phone]. Thanks

## 2020-07-30 ENCOUNTER — Telehealth (HOSPITAL_COMMUNITY): Payer: Self-pay | Admitting: Cardiology

## 2020-07-30 DIAGNOSIS — R9389 Abnormal findings on diagnostic imaging of other specified body structures: Secondary | ICD-10-CM

## 2020-07-30 NOTE — Telephone Encounter (Signed)
Pt aware and voiced understanding Orders placed   [facility] will call pt with appt

## 2020-07-30 NOTE — Telephone Encounter (Signed)
-----   Message from Larey Dresser, MD sent at 07/26/2020  3:00 PM EST ----- Stable 4.3 cm ascending aorta dilation.  Abnormality left breast, uncertain significance.  Radiologist recommends mammogram to rule out significant abnormality.

## 2020-08-19 DIAGNOSIS — M1711 Unilateral primary osteoarthritis, right knee: Secondary | ICD-10-CM | POA: Diagnosis not present

## 2020-08-19 DIAGNOSIS — M25561 Pain in right knee: Secondary | ICD-10-CM | POA: Diagnosis not present

## 2020-09-02 DIAGNOSIS — Z7901 Long term (current) use of anticoagulants: Secondary | ICD-10-CM | POA: Diagnosis not present

## 2020-09-08 DIAGNOSIS — H524 Presbyopia: Secondary | ICD-10-CM | POA: Diagnosis not present

## 2020-10-06 DIAGNOSIS — M25561 Pain in right knee: Secondary | ICD-10-CM | POA: Diagnosis not present

## 2020-10-13 DIAGNOSIS — Z7901 Long term (current) use of anticoagulants: Secondary | ICD-10-CM | POA: Diagnosis not present

## 2020-10-27 ENCOUNTER — Telehealth (HOSPITAL_COMMUNITY): Payer: Self-pay | Admitting: *Deleted

## 2020-10-27 NOTE — Telephone Encounter (Signed)
Received fax from Dr Theodoro Grist office, pt needs clearance for tooth extractions with local anesthetic  Per Dr Aundra Dubin: "pt needs antibiotic prophylaxis, Amox 2000 mg 1 hour prior, Pt should not hold ASA and Coumadin unless felt to be necessary by dentist"  Note faxed back to them at [phone]

## 2020-11-15 DIAGNOSIS — Z85820 Personal history of malignant melanoma of skin: Secondary | ICD-10-CM | POA: Diagnosis not present

## 2020-11-15 DIAGNOSIS — L821 Other seborrheic keratosis: Secondary | ICD-10-CM | POA: Diagnosis not present

## 2020-11-15 DIAGNOSIS — Z85828 Personal history of other malignant neoplasm of skin: Secondary | ICD-10-CM | POA: Diagnosis not present

## 2020-11-15 DIAGNOSIS — L814 Other melanin hyperpigmentation: Secondary | ICD-10-CM | POA: Diagnosis not present

## 2020-11-16 DIAGNOSIS — Z7901 Long term (current) use of anticoagulants: Secondary | ICD-10-CM | POA: Diagnosis not present

## 2020-11-16 DIAGNOSIS — D6869 Other thrombophilia: Secondary | ICD-10-CM | POA: Diagnosis not present

## 2020-11-16 DIAGNOSIS — Z8739 Personal history of other diseases of the musculoskeletal system and connective tissue: Secondary | ICD-10-CM | POA: Diagnosis not present

## 2020-11-16 DIAGNOSIS — I480 Paroxysmal atrial fibrillation: Secondary | ICD-10-CM | POA: Diagnosis not present

## 2020-11-22 DIAGNOSIS — Z7901 Long term (current) use of anticoagulants: Secondary | ICD-10-CM | POA: Diagnosis not present

## 2020-12-07 DIAGNOSIS — Z7901 Long term (current) use of anticoagulants: Secondary | ICD-10-CM | POA: Diagnosis not present

## 2020-12-15 ENCOUNTER — Ambulatory Visit: Payer: Medicare Other

## 2020-12-15 NOTE — Progress Notes (Signed)
   Covid-19 Vaccination Clinic  Name:  MACDONALD RIGOR    MRN: [id number] DOB: 1940-09-14  12/15/2020  [name] was observed post Covid-19 immunization for 15 minutes without incident. He was provided with Vaccine Information Sheet and instruction to access the V-Safe system.   Mr. Lamere was instructed to call 911 with any severe reactions post vaccine: Marland Kitchen Difficulty breathing  . Swelling of face and throat  . A fast heartbeat  . A bad rash all over body  . Dizziness and weakness

## 2020-12-21 ENCOUNTER — Other Ambulatory Visit (HOSPITAL_BASED_OUTPATIENT_CLINIC_OR_DEPARTMENT_OTHER): Payer: Self-pay

## 2020-12-21 MED ORDER — PFIZER-BIONT COVID-19 VAC-TRIS 30 MCG/0.3ML IM SUSP
INTRAMUSCULAR | 0 refills | Status: DC
  Filled 2020-12-21: qty 0.3, 1d supply, fill #0

## 2020-12-23 ENCOUNTER — Other Ambulatory Visit (HOSPITAL_BASED_OUTPATIENT_CLINIC_OR_DEPARTMENT_OTHER): Payer: Self-pay

## 2020-12-30 ENCOUNTER — Other Ambulatory Visit: Payer: Self-pay

## 2020-12-30 ENCOUNTER — Encounter (HOSPITAL_COMMUNITY): Payer: Self-pay | Admitting: Cardiology

## 2020-12-30 ENCOUNTER — Ambulatory Visit (HOSPITAL_COMMUNITY)
Admission: RE | Admit: 2020-12-30 | Discharge: 2020-12-30 | Disposition: A | Discharge status: Home / Self Care | Payer: Medicare Other | Source: Ambulatory Visit | Attending: Cardiology | Admitting: Cardiology

## 2020-12-30 VITALS — BP 110/70 | HR 60 | Wt 206.6 lb

## 2020-12-30 DIAGNOSIS — I110 Hypertensive heart disease with heart failure: Secondary | ICD-10-CM | POA: Insufficient documentation

## 2020-12-30 DIAGNOSIS — I5042 Chronic combined systolic (congestive) and diastolic (congestive) heart failure: Secondary | ICD-10-CM | POA: Diagnosis not present

## 2020-12-30 DIAGNOSIS — I5022 Chronic systolic (congestive) heart failure: Secondary | ICD-10-CM | POA: Insufficient documentation

## 2020-12-30 DIAGNOSIS — I712 Thoracic aortic aneurysm, without rupture: Secondary | ICD-10-CM | POA: Insufficient documentation

## 2020-12-30 DIAGNOSIS — Z7901 Long term (current) use of anticoagulants: Secondary | ICD-10-CM | POA: Insufficient documentation

## 2020-12-30 DIAGNOSIS — I480 Paroxysmal atrial fibrillation: Secondary | ICD-10-CM | POA: Diagnosis not present

## 2020-12-30 DIAGNOSIS — I4891 Unspecified atrial fibrillation: Secondary | ICD-10-CM | POA: Insufficient documentation

## 2020-12-30 DIAGNOSIS — Z952 Presence of prosthetic heart valve: Secondary | ICD-10-CM | POA: Insufficient documentation

## 2020-12-30 DIAGNOSIS — R42 Dizziness and giddiness: Secondary | ICD-10-CM | POA: Diagnosis not present

## 2020-12-30 LAB — BASIC METABOLIC PANEL
Anion gap: 8 (ref 5–15)
BUN: 20 mg/dL (ref 8–23)
CO2: 24 mmol/L (ref 22–32)
Calcium: 8.7 mg/dL — ABNORMAL LOW (ref 8.9–10.3)
Chloride: 105 mmol/L (ref 98–111)
Creatinine, Ser: 0.9 mg/dL (ref 0.61–1.24)
GFR, Estimated: >60 mL/min (ref >60)
Glucose, Bld: 103 mg/dL — ABNORMAL HIGH (ref 70–99)
Potassium: 5.5 mmol/L — ABNORMAL HIGH (ref 3.5–5.1)
Sodium: 137 mmol/L (ref 135–145)

## 2020-12-30 LAB — CBC
HCT: 44 % (ref 39.0–52.0)
Hemoglobin: 13.4 g/dL (ref 13.0–17.0)
MCH: 19.3 pg — ABNORMAL LOW (ref 26.0–34.0)
MCHC: 30.5 g/dL (ref 30.0–36.0)
MCV: 63.2 fL — ABNORMAL LOW (ref 80.0–100.0)
Platelets: 181 10*3/uL (ref 150–400)
RBC: 6.96 MIL/uL — ABNORMAL HIGH (ref 4.22–5.81)
RDW: 18.5 % — ABNORMAL HIGH (ref 11.5–15.5)
WBC: 10.1 10*3/uL (ref 4.0–10.5)
nRBC: 0 % (ref 0.0–0.2)

## 2020-12-30 MED ORDER — METOPROLOL SUCCINATE ER 25 MG PO TB24: 25.0000 mg | ORAL_TABLET | Freq: Every day | ORAL | 3 refills | Status: DC

## 2020-12-30 NOTE — Patient Instructions (Signed)
Decrease Metoprolol to 25 mg Daily AT BEDTIME  Labs done today, your results will be available in MyChart, we will contact you for abnormal readings.  Please call our office in November to schedule your follow up appintment  If you have any questions or concerns before your next appointment please send [place] a message through [place] or call our office at [phone].    TO LEAVE A MESSAGE FOR THE NURSE SELECT OPTION 2, PLEASE LEAVE A MESSAGE INCLUDING: YOUR NAME DATE OF BIRTH CALL BACK NUMBER REASON FOR CALL**this is important as we prioritize the call backs  YOU WILL RECEIVE A CALL BACK THE SAME DAY AS LONG AS YOU CALL BEFORE 4:00 PM  At the [facility], you and your health needs are our priority. As part of our continuing mission to provide you with exceptional heart care, we have created designated Provider Care Teams. These Care Teams include your primary Cardiologist (physician) and Advanced Practice Providers (APPs- Physician Assistants and Nurse Practitioners) who all work together to provide you with the care you need, when you need it.   You may see any of the following providers on your designated Care Team at your next follow up: Dr Glori Bickers Dr [name] Dr Patrice Paradise, NP Lyda Jester, [place] [facility] Smart Audry Riles, PharmD   Please be sure to bring in all your medications bottles to every appointment.

## 2020-12-31 NOTE — Progress Notes (Signed)
PCP: Dr Rex Kras - Also manages INR Cardiology: Dr. Martinique  HF Cardiology: Dr Aundra Dubin   HPI: [name] is a 80 y.o. male with a history of severe AS s/p St Jude mechanical AVR in 03/2003 on chronic coumadin, HTN, obesity, and mild LV dysfxn  EF 40-45% on 01/2016 echo.  Prior to admit in 1/18, he had a cough and dyspnea that was thought to be bronchitis so he was treated with abx and oral steroids. He was seen in cardiology office in 1/18 and was noted to be in rapid atrial fibrillation with significant volume overload.   Admitted 1/23 through 08/29/16 with marked volume overload, lower extremity edema, and atrial fibrillation with RVR.  Initial cardioversion failed and he was difficult to diurese. He was placed on milrinone for low output and diuresed with IV lasix with good response.  Weight came down considerably. Amiodarone was begun. Once fully diuresed, he had successful DC-CV on 08/28/16. Discharged on torsemide 60 mg daily. Discharge weight 212 pounds. Echo and TEE in 1/18 had shown fall in EF to around 25%.   He continued to lose weight as an outpatient.  He had a syncopal episode where he stood up from sitting and got lightheaded with transient loss of consciousness.  He had been having orthostatic symptoms.  We saw him in the office and cut back on torsemide from 60 mg daily down eventually to 20 mg daily.  Creatinine up some to 1.7 at that time.    At a prior appt, he was back in atrial fibrillation.  He was set up for DCCV in 3/18, but was in NSR when he arrived to short stay. He had atrial fibrillation ablation with Dr. Curt Bears in 5/18.   Echo was done in 5/18, EF up to 45% with mild diffuse hypokinesis, normal mechanical aortic valve, mildly decreased RV systolic function.    Echo was repeated in 5/19, EF 35-40%, diffuse hypokinesis, mildly decreased RV systolic function, stable mechanical aortic valve.   Echo in 11/21 showed EF up to 50-55%, normal RV size and systolic function, mechanical  aortic valve looks ok, normal IVC, ascending aorta 4.2 cm. Echo in 12/21 showed EF 60-65%, mild RV enlargement, normal RV function, mechanical aortic valve mean gradient 10 mmHg, ascending aorta 4.1 cm.  MRA chest in 1/22 showed 4.3 cm ascending aorta.   He returns for followup of CHF.  Occasional lightheaded with standing, SBP 90s-100s when he checks at home.  No dyspnea walking on flat ground or going up stairs.  No chest pain.    ECG (personally reviewed): NSR, 1st degree AVB, iRBBB  Labs (09/04/2016):  Hgb 15.9, K 5.0, Creatinine 1.77, digoxin 0.7, BNP 326  Labs (3/18): digoxin 0.8, K 4.8, creatinine 1.44, TSH normal, AST 54, ALT 49 Labs (4/18): K 4.3, creatinine 1.3 Labs (7/18): K 3.8, creatinine 0.9, hgb 11.9 Labs (11/18): K 4.9, creatinine 1.0, hgb 13.5 Labs (3/19): K 4.7,creatinine 0.99 Labs (5/19): K 4.6, creatinine 0.98 Labs (9/19): K 4.2, creatinine 0.91 Labs (12/19): K 4.6, creatinine 1.14 Labs (3/20): K 4.6, creatinine 0.98 Labs (7/20): creatinine 1.07 Labs (9/21): K 5, creatinine 1.13 Labs (12/21): LDL 77, K 4.1, creatinine 0.97  PMH: 1. Aortic stenosis: # 25 St Jude mechanical aortic valve in 9/14.   2. Chronic systolic CHF: Echo in 3/26 with EF 45-50%.  Admitted in 1/18 with afib with RVR, ?tachycardia-mediated cardiomyopathy.  - Echo (1/18) with EF 25-30%, stable mechanical aortic valve.  - TEE (1/18) with EF 20-25%, stable  mechanical aortic valve, moderate MR, small PFO.  - Admission 1/18 with acute on chronic systolic CHF, required milrinone, extensively diuresed.  - Echo (5/18) with EF 45%, diffuse hypokinesis, normal RV size with mildly decreased systolic function, mechanical aortic valve with mean gradient 14 mmHg, ascending aorta 4.4 cm.  - Echo (5/19) with EF 35-40%, diffuse hypokinesis, mild LVH, mildly decreased RV systolic function, mechanical aortic valve looked ok.   - Echo (11/20) with EF up to 50-55%, normal RV size and systolic function, mechanical aortic  valve looks ok, normal IVC, ascending aorta 4.2 cm.  - Echo (12/21) with EF 60-65%, mild RV enlargement, normal RV function, mechanical aortic valve mean gradient 10 mmHg, ascending aorta 4.1 cm. 3. Atrial fibrillation: First noted in 1/61, uncertain how long it had been present (with RVR).  08/28/2016 Successful DC-CV --> NSR.  Back in atrial fibrillation at 09/18/16 office visit.  Set up for DCCV in 3/18 but back in NSR.   - Atrial fibrillation ablation in 5/18. Now off amiodarone.  4. 09/04/2016 Syncope--> over-diuresis most likely.  5. HTN 6. Ascending aortic aneurysm: 4.4 cm on 7/17 echo, 4.4 cm on 5/18 echo.  - MRA chest (4/19): 4.5 cm ascending aortic aneurysm.  - MRA chest (7/20): 4.5 cm ascending aortic aneurysm.  - MRA chest (1/22): 4.3 cm ascending aortic aneurysm (breast abnormality, recommend mammogram) 7. Prostate cancer: Treated with radioactive seed implantation.  8. Nephrolithiasis.  9. PFTs (4/18): Mild restriction.   ROS: All systems negative except as listed in HPI, PMH and Problem List.  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  Tobacco Use   Smoking status: Never   Smokeless tobacco: Never  Vaping Use   Vaping Use: Never used  Substance and Sexual Activity   Alcohol use: Yes    Alcohol/week: 3.0 standard drinks    Types: 3 Cans of beer per week    Comment:  "may have 2-3 beers on the weekend"   Drug use: No   Sexual activity: Not on file  Other Topics Concern   Not on file  Social History Narrative   Not on file   Social Determinants of Health   Financial Resource Strain: Not on file  Food Insecurity: Not on file  Transportation Needs: Not on file  Physical Activity: Not on file  Stress: Not on file  Social Connections: Not on  file  Intimate Partner Violence: Not on file    FH:  Family History  Problem Relation Age of Onset   Heart failure Mother    Diabetes Mother    Prostate cancer Father     Current Outpatient Medications  Medication Sig Dispense Refill   allopurinol (ZYLOPRIM) 300 MG tablet Take 300 mg by mouth daily.     aspirin 81 MG chewable tablet Chew 1 tablet (81 mg total) by mouth daily. 30 tablet 6   colchicine 0.6 MG tablet Take 0.6 mg by mouth daily as needed (for gout).      ENTRESTO 24-26 MG TAKE 1 TABLET BY MOUTH TWICE DAILY 096 tablet 3   folic acid (FOLVITE) 1 MG tablet TAKE 1 TABLET(1 MG) BY MOUTH DAILY 90 tablet 3   Psyllium (METAMUCIL PO) Take 2 capsules by  mouth 2 (two) times daily.      spironolactone (ALDACTONE) 25 MG tablet Take 12.5 mg by mouth daily.     tamsulosin (FLOMAX) 0.4 MG CAPS capsule Take 0.4 mg by mouth every other day.      tolterodine (DETROL LA) 4 MG 24 hr capsule Take 4 mg by mouth every other day.   0   torsemide (DEMADEX) 20 MG tablet Take 1 tablet by mouth daily as needed.  3   warfarin (COUMADIN) 5 MG tablet Take 5 mg by mouth daily. Take 2.5 mg every Tuesday and Friday, take 5 mg all other days     metoprolol succinate (TOPROL-XL) 25 MG 24 hr tablet Take 1 tablet (25 mg total) by mouth at bedtime. 180 tablet 3   No current facility-administered medications for this encounter.    Vitals:   12/30/20 1104  BP: 110/70  Pulse: 60  SpO2: 97%  Weight: 93.7 kg (206 lb 9.6 oz)   Filed Weights   12/30/20 1104  Weight: 93.7 kg (206 lb 9.6 oz)   PHYSICAL EXAM: General: NAD Neck: No JVD, no thyromegaly or thyroid nodule.  Lungs: Clear to auscultation bilaterally with normal respiratory effort. CV: Nondisplaced PMI.  Heart regular S1/S2 with mechanical S2, no S3/S4, 1/6 SEM RUSB.  1+ ankle edema.  No carotid bruit.  Normal pedal pulses.  Abdomen: Soft, nontender, no hepatosplenomegaly, no distention.  Skin: Intact without lesions or rashes.  Neurologic:  Alert and oriented x 3.  Psych: Normal affect. Extremities: No clubbing or cyanosis.  HEENT: Normal.   ASSESSMENT & PLAN: 1. Chronic systolic CHF: EF on TEE in 1/18 20-25%, mechanical aortic valve ok. 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  He required milrinone to assist diuresis during 1/18-2/18 admission.  Now that he is back in NSR s/p atrial fibrillation ablation, echo in 5/18 showed EF back up to 45%.  Echo in 5/19 showed mildly lower systolic function, EF 63-01%. Most recent echo in 12/21 showed EF up to 60-65%.  Currently, NYHA class II symptoms and does not appear significantly volume overloaded.  Medication titration has been limited by lightheadedness in the past, he has mild orthostatic symptoms currently.  - Continue spironolactone 12.5 mg daily.  - Continue Entresto 24/26 bid. BMET today.      - Decrease Toprol XL to 25 mg qhs with soft BP.   - Given no ACS/chest pain and stabilization of EF, I think we can defer coronary angiography.  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  If he has any chest pain or another fall in EF, will need angiography.  2. Mechanical aortic valve: Continue warfarin and ASA 81. Valve looked ok on 12/21 echo. 3. Atrial fibrillation: Admitted 1/18 with atrial fibrillation with RVR and CHF.  EF down to 20-25%.  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  Need to keep him in NSR. DCCV during 2/18 admission and now s/p atrial fibrillation ablation.  He is in NSR today.  -  He is now off amiodarone.   - Continue warfarin. CBC today.  4. Ascending aortic aneurysm: 4.3 cm on 1/22 MRA chest.  There was a possible breast abnormality, mammogram scheduled.   Followup in 6 months.   [name] 12/31/2020

## 2021-01-05 ENCOUNTER — Telehealth (HOSPITAL_COMMUNITY): Payer: Self-pay

## 2021-01-05 DIAGNOSIS — I5042 Chronic combined systolic (congestive) and diastolic (congestive) heart failure: Secondary | ICD-10-CM

## 2021-01-05 NOTE — Telephone Encounter (Signed)
Patient advised and verbalized understanding. Med list updated to reflect changes, lab order entered, lab appt scheduled.

## 2021-01-05 NOTE — Telephone Encounter (Signed)
-----   Message from Larey Dresser, MD sent at 12/30/2020  5:22 PM EDT ----- EF is back normal, would stop spironolactone with elevated creatinine.  Low K diet, BMET 10 days.

## 2021-01-07 ENCOUNTER — Other Ambulatory Visit: Payer: Self-pay | Admitting: Hematology & Oncology

## 2021-01-07 DIAGNOSIS — D500 Iron deficiency anemia secondary to blood loss (chronic): Secondary | ICD-10-CM

## 2021-01-14 ENCOUNTER — Ambulatory Visit (HOSPITAL_COMMUNITY)
Admission: RE | Admit: 2021-01-14 | Discharge: 2021-01-14 | Disposition: A | Discharge status: Home / Self Care | Payer: Medicare Other | Source: Ambulatory Visit | Attending: Internal Medicine | Admitting: Internal Medicine

## 2021-01-14 ENCOUNTER — Other Ambulatory Visit: Payer: Self-pay

## 2021-01-14 DIAGNOSIS — I5042 Chronic combined systolic (congestive) and diastolic (congestive) heart failure: Secondary | ICD-10-CM | POA: Diagnosis not present

## 2021-01-14 LAB — BASIC METABOLIC PANEL
Anion gap: 5 (ref 5–15)
BUN: 17 mg/dL (ref 8–23)
CO2: 28 mmol/L (ref 22–32)
Calcium: 8.8 mg/dL — ABNORMAL LOW (ref 8.9–10.3)
Chloride: 103 mmol/L (ref 98–111)
Creatinine, Ser: 1.02 mg/dL (ref 0.61–1.24)
GFR, Estimated: >60 mL/min (ref >60)
Glucose, Bld: 110 mg/dL — ABNORMAL HIGH (ref 70–99)
Potassium: 4.8 mmol/L (ref 3.5–5.1)
Sodium: 136 mmol/L (ref 135–145)

## 2021-01-16 ENCOUNTER — Other Ambulatory Visit (HOSPITAL_COMMUNITY): Payer: Self-pay | Admitting: Cardiology

## 2021-02-01 DIAGNOSIS — Z7901 Long term (current) use of anticoagulants: Secondary | ICD-10-CM | POA: Diagnosis not present

## 2021-02-16 ENCOUNTER — Ambulatory Visit: Payer: Medicare Other

## 2021-02-16 ENCOUNTER — Other Ambulatory Visit: Payer: Self-pay | Admitting: Cardiology

## 2021-02-16 DIAGNOSIS — R9389 Abnormal findings on diagnostic imaging of other specified body structures: Secondary | ICD-10-CM

## 2021-02-21 DIAGNOSIS — M545 Low back pain, unspecified: Secondary | ICD-10-CM | POA: Diagnosis not present

## 2021-02-21 DIAGNOSIS — N3281 Overactive bladder: Secondary | ICD-10-CM | POA: Diagnosis not present

## 2021-02-21 DIAGNOSIS — R351 Nocturia: Secondary | ICD-10-CM | POA: Diagnosis not present

## 2021-02-21 DIAGNOSIS — N401 Enlarged prostate with lower urinary tract symptoms: Secondary | ICD-10-CM | POA: Diagnosis not present

## 2021-02-24 ENCOUNTER — Ambulatory Visit
Admission: RE | Admit: 2021-02-24 | Discharge: 2021-02-24 | Disposition: A | Discharge status: Home / Self Care | Payer: Medicare Other | Source: Ambulatory Visit | Attending: Cardiology | Admitting: Cardiology

## 2021-02-24 ENCOUNTER — Other Ambulatory Visit: Payer: Self-pay

## 2021-02-24 DIAGNOSIS — R9389 Abnormal findings on diagnostic imaging of other specified body structures: Secondary | ICD-10-CM

## 2021-02-24 DIAGNOSIS — N62 Hypertrophy of breast: Secondary | ICD-10-CM | POA: Diagnosis not present

## 2021-02-24 DIAGNOSIS — R922 Inconclusive mammogram: Secondary | ICD-10-CM | POA: Diagnosis not present

## 2021-03-01 DIAGNOSIS — Z7901 Long term (current) use of anticoagulants: Secondary | ICD-10-CM | POA: Diagnosis not present

## 2021-04-16 ENCOUNTER — Other Ambulatory Visit (HOSPITAL_COMMUNITY): Payer: Self-pay | Admitting: Cardiology

## 2021-04-28 DIAGNOSIS — Z7901 Long term (current) use of anticoagulants: Secondary | ICD-10-CM | POA: Diagnosis not present

## 2021-04-28 DIAGNOSIS — I480 Paroxysmal atrial fibrillation: Secondary | ICD-10-CM | POA: Diagnosis not present

## 2021-05-18 ENCOUNTER — Telehealth (HOSPITAL_COMMUNITY): Payer: Self-pay | Admitting: *Deleted

## 2021-05-18 DIAGNOSIS — Z8546 Personal history of malignant neoplasm of prostate: Secondary | ICD-10-CM | POA: Diagnosis not present

## 2021-05-18 NOTE — Telephone Encounter (Signed)
Pt left vm stating he is short of breath, coughing, having trouble breathing at night while laying down. Pt thinks he may be in afib.

## 2021-05-20 ENCOUNTER — Ambulatory Visit (HOSPITAL_COMMUNITY)
Admission: RE | Admit: 2021-05-20 | Discharge: 2021-05-20 | Disposition: A | Discharge status: Home / Self Care | Payer: Medicare Other | Source: Ambulatory Visit | Attending: Cardiology | Admitting: Cardiology

## 2021-05-20 ENCOUNTER — Encounter (HOSPITAL_COMMUNITY): Payer: Self-pay | Admitting: Cardiology

## 2021-05-20 ENCOUNTER — Other Ambulatory Visit: Payer: Self-pay

## 2021-05-20 VITALS — BP 122/80 | HR 89 | Wt 212.4 lb

## 2021-05-20 DIAGNOSIS — I110 Hypertensive heart disease with heart failure: Secondary | ICD-10-CM | POA: Diagnosis not present

## 2021-05-20 DIAGNOSIS — I4891 Unspecified atrial fibrillation: Secondary | ICD-10-CM | POA: Insufficient documentation

## 2021-05-20 DIAGNOSIS — Z79899 Other long term (current) drug therapy: Secondary | ICD-10-CM | POA: Diagnosis not present

## 2021-05-20 DIAGNOSIS — Z7982 Long term (current) use of aspirin: Secondary | ICD-10-CM | POA: Insufficient documentation

## 2021-05-20 DIAGNOSIS — I5042 Chronic combined systolic (congestive) and diastolic (congestive) heart failure: Secondary | ICD-10-CM | POA: Diagnosis not present

## 2021-05-20 DIAGNOSIS — R0600 Dyspnea, unspecified: Secondary | ICD-10-CM | POA: Diagnosis not present

## 2021-05-20 DIAGNOSIS — I7121 Aneurysm of the ascending aorta, without rupture: Secondary | ICD-10-CM | POA: Diagnosis not present

## 2021-05-20 DIAGNOSIS — R0601 Orthopnea: Secondary | ICD-10-CM | POA: Diagnosis not present

## 2021-05-20 DIAGNOSIS — Z8249 Family history of ischemic heart disease and other diseases of the circulatory system: Secondary | ICD-10-CM | POA: Insufficient documentation

## 2021-05-20 DIAGNOSIS — I517 Cardiomegaly: Secondary | ICD-10-CM | POA: Diagnosis not present

## 2021-05-20 DIAGNOSIS — Z7901 Long term (current) use of anticoagulants: Secondary | ICD-10-CM | POA: Insufficient documentation

## 2021-05-20 DIAGNOSIS — Z952 Presence of prosthetic heart valve: Secondary | ICD-10-CM | POA: Diagnosis not present

## 2021-05-20 DIAGNOSIS — R059 Cough, unspecified: Secondary | ICD-10-CM | POA: Diagnosis not present

## 2021-05-20 DIAGNOSIS — R0602 Shortness of breath: Secondary | ICD-10-CM | POA: Insufficient documentation

## 2021-05-20 DIAGNOSIS — I480 Paroxysmal atrial fibrillation: Secondary | ICD-10-CM | POA: Diagnosis not present

## 2021-05-20 LAB — BASIC METABOLIC PANEL
Anion gap: 6 (ref 5–15)
BUN: 18 mg/dL (ref 8–23)
CO2: 27 mmol/L (ref 22–32)
Calcium: 8.7 mg/dL — ABNORMAL LOW (ref 8.9–10.3)
Chloride: 103 mmol/L (ref 98–111)
Creatinine, Ser: 1.14 mg/dL (ref 0.61–1.24)
GFR, Estimated: >60 mL/min (ref >60)
Glucose, Bld: 138 mg/dL — ABNORMAL HIGH (ref 70–99)
Potassium: 5.1 mmol/L (ref 3.5–5.1)
Sodium: 136 mmol/L (ref 135–145)

## 2021-05-20 LAB — CBC
HCT: 42.2 % (ref 39.0–52.0)
Hemoglobin: 13 g/dL (ref 13.0–17.0)
MCH: 19.4 pg — ABNORMAL LOW (ref 26.0–34.0)
MCHC: 30.8 g/dL (ref 30.0–36.0)
MCV: 63 fL — ABNORMAL LOW (ref 80.0–100.0)
Platelets: 172 10*3/uL (ref 150–400)
RBC: 6.7 MIL/uL — ABNORMAL HIGH (ref 4.22–5.81)
RDW: 18.6 % — ABNORMAL HIGH (ref 11.5–15.5)
WBC: 11.1 10*3/uL — ABNORMAL HIGH (ref 4.0–10.5)
nRBC: 0 % (ref 0.0–0.2)

## 2021-05-20 LAB — BRAIN NATRIURETIC PEPTIDE: B Natriuretic Peptide: 859.7 pg/mL — ABNORMAL HIGH (ref 0.0–100.0)

## 2021-05-20 MED ORDER — TORSEMIDE 20 MG PO TABS: ORAL_TABLET | ORAL | 3 refills | Status: DC

## 2021-05-20 MED ORDER — POTASSIUM CHLORIDE ER 10 MEQ PO TBCR: EXTENDED_RELEASE_TABLET | ORAL | 3 refills | Status: DC

## 2021-05-20 NOTE — Patient Instructions (Signed)
Take Torsemide 20 mg Daily FOR 5 DAYS, then decrease to 20 mg every other day   Take Potassium 10 meq Daily FOR 5 DAYS, then decrease to 10 meq every other day  Labs done today, your results will be available in MyChart, we will contact you for abnormal readings.  A chest x-ray takes a picture of the organs and structures inside the chest, including the heart, lungs, and blood vessels. This test can show several things, including, whether the heart is enlarges; whether fluid is building up in the lungs; and whether pacemaker / defibrillator leads are still in place.  Your physician recommends that you return for lab work in: 10 days  Your physician recommends that you schedule a follow-up appointment in: 3 weeks  If you have any questions or concerns before your next appointment please send [place] a message through [place] or call our office at [phone].    TO LEAVE A MESSAGE FOR THE NURSE SELECT OPTION 2, PLEASE LEAVE A MESSAGE INCLUDING: YOUR NAME DATE OF BIRTH CALL BACK NUMBER REASON FOR CALL**this is important as we prioritize the call backs  YOU WILL RECEIVE A CALL BACK THE SAME DAY AS LONG AS YOU CALL BEFORE 4:00 PM  At the [facility], you and your health needs are our priority. As part of our continuing mission to provide you with exceptional heart care, we have created designated Provider Care Teams. These Care Teams include your primary Cardiologist (physician) and Advanced Practice Providers (APPs- Physician Assistants and Nurse Practitioners) who all work together to provide you with the care you need, when you need it.   You may see any of the following providers on your designated Care Team at your next follow up: Dr Glori Bickers Dr Haynes Kerns, NP Lyda Jester, [place] [facility] [place], [place] Audry Riles, PharmD   Please be sure to bring in all your medications bottles to every appointment.

## 2021-05-21 NOTE — Progress Notes (Signed)
PCP: Dr Rex Kras - Also manages INR Cardiology: Dr. Martinique  HF Cardiology: Dr Aundra Dubin   HPI: Adrian Lambert is a 80 y.o. male with a history of severe AS s/p St Jude mechanical AVR in 03/2003 on chronic coumadin, HTN, obesity, and mild LV dysfxn  EF 40-45% on 01/2016 echo.  Prior to admit in 1/18, he had a cough and dyspnea that was thought to be bronchitis so he was treated with abx and oral steroids. He was seen in cardiology office in 1/18 and was noted to be in rapid atrial fibrillation with significant volume overload.   Admitted 1/23 through 08/29/16 with marked volume overload, lower extremity edema, and atrial fibrillation with RVR.  Initial cardioversion failed and he was difficult to diurese. He was placed on milrinone for low output and diuresed with IV lasix with good response.  Weight came down considerably. Amiodarone was begun. Once fully diuresed, he had successful DC-CV on 08/28/16. Discharged on torsemide 60 mg daily. Discharge weight 212 pounds. Echo and TEE in 1/18 had shown fall in EF to around 25%.   He continued to lose weight as an outpatient.  He had a syncopal episode where he stood up from sitting and got lightheaded with transient loss of consciousness.  He had been having orthostatic symptoms.  We saw him in the office and cut back on torsemide from 60 mg daily down eventually to 20 mg daily.  Creatinine up some to 1.7 at that time.    At a prior appt, he was back in atrial fibrillation.  He was set up for DCCV in 3/18, but was in NSR when he arrived to short stay. He had atrial fibrillation ablation with Dr. Curt Bears in 5/18.   Echo was done in 5/18, EF up to 45% with mild diffuse hypokinesis, normal mechanical aortic valve, mildly decreased RV systolic function.    Echo was repeated in 5/19, EF 35-40%, diffuse hypokinesis, mildly decreased RV systolic function, stable mechanical aortic valve.   Echo in 11/21 showed EF up to 50-55%, normal RV size and systolic function, mechanical  aortic valve looks ok, normal IVC, ascending aorta 4.2 cm. Echo in 12/21 showed EF 60-65%, mild RV enlargement, normal RV function, mechanical aortic valve mean gradient 10 mmHg, ascending aorta 4.1 cm.  MRA chest in 1/22 showed 4.3 cm ascending aorta.   He returns for followup of CHF.  He has had a dry cough for about a week.  He is short of breath walking up stairs or walking more than 100-200 feet.  +Orthopnea.  No chest pain.  Dyspnea has also gone on for about a week.  No definite trigger.  Weight is up 6 lbs.   ECG (personally reviewed): NSR, 1st degree AVB, iRBBB, inferior Qs  REDS clip 44%  Labs (09/04/2016):  Hgb 15.9, K 5.0, Creatinine 1.77, digoxin 0.7, BNP 326  Labs (3/18): digoxin 0.8, K 4.8, creatinine 1.44, TSH normal, AST 54, ALT 49 Labs (4/18): K 4.3, creatinine 1.3 Labs (7/18): K 3.8, creatinine 0.9, hgb 11.9 Labs (11/18): K 4.9, creatinine 1.0, hgb 13.5 Labs (3/19): K 4.7,creatinine 0.99 Labs (5/19): K 4.6, creatinine 0.98 Labs (9/19): K 4.2, creatinine 0.91 Labs (12/19): K 4.6, creatinine 1.14 Labs (3/20): K 4.6, creatinine 0.98 Labs (7/20): creatinine 1.07 Labs (9/21): K 5, creatinine 1.13 Labs (12/21): LDL 77, K 4.1, creatinine 0.97 Labs (6/22): K 4.8, creatinine 1.02  PMH: 1. Aortic stenosis: # 25 St Jude mechanical aortic valve in 9/14.   2. Chronic systolic  CHF: Echo in 7/17 with EF 45-50%.  Admitted in 1/18 with afib with RVR, ?tachycardia-mediated cardiomyopathy.  - Echo (1/18) with EF 25-30%, stable mechanical aortic valve.  - TEE (1/18) with EF 20-25%, stable mechanical aortic valve, moderate Adrian, small PFO.  - Admission 1/18 with acute on chronic systolic CHF, required milrinone, extensively diuresed.  - Echo (5/18) with EF 45%, diffuse hypokinesis, normal RV size with mildly decreased systolic function, mechanical aortic valve with mean gradient 14 mmHg, ascending aorta 4.4 cm.  - Echo (5/19) with EF 35-40%, diffuse hypokinesis, mild LVH, mildly decreased RV  systolic function, mechanical aortic valve looked ok.   - Echo (11/20) with EF up to 50-55%, normal RV size and systolic function, mechanical aortic valve looks ok, normal IVC, ascending aorta 4.2 cm.  - Echo (12/21) with EF 60-65%, mild RV enlargement, normal RV function, mechanical aortic valve mean gradient 10 mmHg, ascending aorta 4.1 cm. 3. Atrial fibrillation: First noted in 2/33, uncertain how long it had been present (with RVR).  08/28/2016 Successful DC-CV --> NSR.  Back in atrial fibrillation at 09/18/16 office visit.  Set up for DCCV in 3/18 but back in NSR.   - Atrial fibrillation ablation in 5/18. Now off amiodarone.  4. 09/04/2016 Syncope--> over-diuresis most likely.  5. HTN 6. Ascending aortic aneurysm: 4.4 cm on 7/17 echo, 4.4 cm on 5/18 echo.  - MRA chest (4/19): 4.5 cm ascending aortic aneurysm.  - MRA chest (7/20): 4.5 cm ascending aortic aneurysm.  - MRA chest (1/22): 4.3 cm ascending aortic aneurysm (breast abnormality, recommend mammogram) 7. Prostate cancer: Treated with radioactive seed implantation.  8. Nephrolithiasis.  9. PFTs (4/18): Mild restriction.  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  Tobacco Use   Smoking status: Never   Smokeless tobacco: Never  Vaping Use   Vaping Use: Never used  Substance and Sexual Activity   Alcohol use: Yes    Alcohol/week: 3.0 standard drinks    Types: 3 Cans of beer per week    Comment:  "may have 2-3 beers on the weekend"   Drug use: No   Sexual activity: Not on file  Other Topics Concern   Not on file  Social History Narrative   Not on file   Social Determinants of Health   Financial Resource  Strain: Not on file  Food Insecurity: Not on file  Transportation Needs: Not on file  Physical Activity: Not on file  Stress: Not on file  Social Connections: Not on file  Intimate Partner Violence: Not on file    FH:  Family History  Problem Relation Age of Onset   Heart failure Mother    Diabetes Mother    Prostate cancer Father     Current Outpatient Medications  Medication Sig Dispense Refill   allopurinol (ZYLOPRIM) 300 MG tablet Take 300 mg by mouth daily.     aspirin 81 MG chewable tablet Chew 1 tablet (81 mg total) by mouth daily. 30 tablet 6   colchicine 0.6 MG tablet Take 0.6 mg by mouth daily as needed (for gout).      Docusate  Calcium (STOOL SOFTENER PO) Take 1 tablet by mouth 2 (two) times daily.     ENTRESTO 24-26 MG TAKE 1 TABLET BY MOUTH TWICE DAILY 102 tablet 3   folic acid (FOLVITE) 1 MG tablet TAKE 1 TABLET(1 MG) BY MOUTH DAILY 90 tablet 3   metoprolol succinate (TOPROL-XL) 25 MG 24 hr tablet Take 1 tablet (25 mg total) by mouth at bedtime. 30 tablet 3   potassium chloride (KLOR-CON) 10 MEQ tablet Take 1 tablet (10 mEq total) by mouth daily for 5 days, THEN 1 tablet (10 mEq total) every other day. 30 tablet 3   Psyllium (METAMUCIL PO) Take 2 capsules by mouth 2 (two) times daily.      tamsulosin (FLOMAX) 0.4 MG CAPS capsule Take 0.4 mg by mouth every other day.      tolterodine (DETROL LA) 4 MG 24 hr capsule Take 4 mg by mouth every other day.   0   warfarin (COUMADIN) 5 MG tablet Take 5 mg by mouth daily. Take 2.5 mg every Tuesday and Friday, take 5 mg all other days     torsemide (DEMADEX) 20 MG tablet Take 1 tablet (20 mg total) by mouth daily for 5 days, THEN 1 tablet (20 mg total) every other day. 30 tablet 3   No current facility-administered medications for this encounter.    Vitals:   05/20/21 1035  BP: 122/80  Pulse: 89  SpO2: 97%  Weight: 96.3 kg (212 lb 6.4 oz)   Filed Weights   05/20/21 1035  Weight: 96.3 kg (212 lb 6.4 oz)   PHYSICAL  EXAM: General: NAD Neck: JVP 8-9 cm with HJR, no thyromegaly or thyroid nodule.  Lungs: Clear to auscultation bilaterally with normal respiratory effort. CV: Nondisplaced PMI.  Heart regular S1/S2 with mechanical S2, no S3/S4, 2/6 SEM RUSB.  1+ edema to knees.  No carotid bruit.  Normal pedal pulses.  Abdomen: Soft, nontender, no hepatosplenomegaly, no distention.  Skin: Intact without lesions or rashes.  Neurologic: Alert and oriented x 3.  Psych: Normal affect. Extremities: No clubbing or cyanosis.  HEENT: Normal.   ASSESSMENT & PLAN: 1. Chronic systolic CHF: EF on TEE in 1/18 20-25%, mechanical aortic valve ok. 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  He required milrinone to assist diuresis during 1/18-2/18 admission.  Now that he is back in NSR s/p atrial fibrillation ablation, echo in 5/18 showed EF back up to 45%.  Echo in 5/19 showed mildly lower systolic function, EF 72-53%. Most recent echo in 12/21 showed EF up to 60-65%.  Currently, worsened NYHA class III symptoms, weight up and volume overloaded on exam and by REDS clip.  Medication titration has been limited by lightheadedness in the past, he has mild orthostatic symptoms currently.  - CXR today.  - Repeat echo.  - Continue Entresto 24/26 bid.      - Continue Toprol XL 25 mg daily.   - Start torsemide 20 mg daily with KCl 10 mEq daily x 5 days, then change to every other day. BMET/BNP today, BMET 10 days.  2. Mechanical aortic valve: Continue warfarin and ASA 81. Valve looked ok on 12/21 echo. - CBC today.  3. Atrial fibrillation: Admitted 1/18 with atrial fibrillation with RVR and CHF.  EF down to 20-25%.  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  Need to keep him in NSR. DCCV  during 2/18 admission and now s/p atrial fibrillation ablation.  He is in NSR today.  - He is now off  amiodarone.   - Continue warfarin. CBC today.  4. Ascending aortic aneurysm: 4.3 cm on 1/22 MRA chest.    Followup with APP 3 wks.    [name] 05/21/2021

## 2021-05-24 DIAGNOSIS — Z0000 Encounter for general adult medical examination without abnormal findings: Secondary | ICD-10-CM | POA: Diagnosis not present

## 2021-05-25 DIAGNOSIS — C61 Malignant neoplasm of prostate: Secondary | ICD-10-CM | POA: Diagnosis not present

## 2021-05-25 DIAGNOSIS — N3281 Overactive bladder: Secondary | ICD-10-CM | POA: Diagnosis not present

## 2021-05-25 DIAGNOSIS — R351 Nocturia: Secondary | ICD-10-CM | POA: Diagnosis not present

## 2021-05-25 DIAGNOSIS — N401 Enlarged prostate with lower urinary tract symptoms: Secondary | ICD-10-CM | POA: Diagnosis not present

## 2021-05-26 DIAGNOSIS — Z0000 Encounter for general adult medical examination without abnormal findings: Secondary | ICD-10-CM | POA: Diagnosis not present

## 2021-05-26 DIAGNOSIS — E782 Mixed hyperlipidemia: Secondary | ICD-10-CM | POA: Diagnosis not present

## 2021-05-26 DIAGNOSIS — Z8739 Personal history of other diseases of the musculoskeletal system and connective tissue: Secondary | ICD-10-CM | POA: Diagnosis not present

## 2021-05-26 DIAGNOSIS — I10 Essential (primary) hypertension: Secondary | ICD-10-CM | POA: Diagnosis not present

## 2021-05-26 DIAGNOSIS — R7301 Impaired fasting glucose: Secondary | ICD-10-CM | POA: Diagnosis not present

## 2021-05-30 ENCOUNTER — Ambulatory Visit (HOSPITAL_COMMUNITY)
Admission: RE | Admit: 2021-05-30 | Discharge: 2021-05-30 | Disposition: A | Discharge status: Home / Self Care | Payer: Medicare Other | Source: Ambulatory Visit | Attending: Internal Medicine | Admitting: Internal Medicine

## 2021-05-30 ENCOUNTER — Other Ambulatory Visit: Payer: Self-pay

## 2021-05-30 DIAGNOSIS — I5042 Chronic combined systolic (congestive) and diastolic (congestive) heart failure: Secondary | ICD-10-CM | POA: Insufficient documentation

## 2021-05-30 LAB — BASIC METABOLIC PANEL
Anion gap: 8 (ref 5–15)
BUN: 25 mg/dL — ABNORMAL HIGH (ref 8–23)
CO2: 29 mmol/L (ref 22–32)
Calcium: 9.2 mg/dL (ref 8.9–10.3)
Chloride: 103 mmol/L (ref 98–111)
Creatinine, Ser: 1.02 mg/dL (ref 0.61–1.24)
GFR, Estimated: >60 mL/min (ref >60)
Glucose, Bld: 112 mg/dL — ABNORMAL HIGH (ref 70–99)
Potassium: 4.9 mmol/L (ref 3.5–5.1)
Sodium: 140 mmol/L (ref 135–145)

## 2021-06-08 ENCOUNTER — Encounter (HOSPITAL_COMMUNITY): Payer: Self-pay

## 2021-06-08 ENCOUNTER — Ambulatory Visit (HOSPITAL_COMMUNITY)
Admission: RE | Admit: 2021-06-08 | Discharge: 2021-06-08 | Disposition: A | Discharge status: Home / Self Care | Payer: Medicare Other | Source: Ambulatory Visit | Attending: Family Medicine | Admitting: Family Medicine

## 2021-06-08 ENCOUNTER — Other Ambulatory Visit: Payer: Self-pay

## 2021-06-08 VITALS — BP 106/76 | HR 87 | Wt 209.2 lb

## 2021-06-08 DIAGNOSIS — I5022 Chronic systolic (congestive) heart failure: Secondary | ICD-10-CM | POA: Insufficient documentation

## 2021-06-08 DIAGNOSIS — I714 Abdominal aortic aneurysm, without rupture, unspecified: Secondary | ICD-10-CM | POA: Diagnosis not present

## 2021-06-08 DIAGNOSIS — I110 Hypertensive heart disease with heart failure: Secondary | ICD-10-CM | POA: Insufficient documentation

## 2021-06-08 DIAGNOSIS — Z952 Presence of prosthetic heart valve: Secondary | ICD-10-CM | POA: Insufficient documentation

## 2021-06-08 DIAGNOSIS — I480 Paroxysmal atrial fibrillation: Secondary | ICD-10-CM

## 2021-06-08 DIAGNOSIS — Z7901 Long term (current) use of anticoagulants: Secondary | ICD-10-CM | POA: Insufficient documentation

## 2021-06-08 DIAGNOSIS — I4891 Unspecified atrial fibrillation: Secondary | ICD-10-CM | POA: Insufficient documentation

## 2021-06-08 DIAGNOSIS — Z79899 Other long term (current) drug therapy: Secondary | ICD-10-CM | POA: Insufficient documentation

## 2021-06-08 DIAGNOSIS — Z6830 Body mass index (BMI) 30.0-30.9, adult: Secondary | ICD-10-CM | POA: Insufficient documentation

## 2021-06-08 DIAGNOSIS — I5042 Chronic combined systolic (congestive) and diastolic (congestive) heart failure: Secondary | ICD-10-CM | POA: Diagnosis not present

## 2021-06-08 NOTE — Progress Notes (Signed)
PCP: Dr Rex Kras - Also manages INR Cardiology: Dr. Martinique  HF Cardiology: Dr Aundra Dubin   HPI: Adrian Lambert is a 80 y.o. male with a history of severe AS s/p St Jude mechanical AVR in 03/2003 on chronic coumadin, HTN, obesity, and mild LV dysfxn  EF 40-45% on 01/2016 echo.  Prior to admit in 1/18, he had a cough and dyspnea that was thought to be bronchitis so he was treated with abx and oral steroids. He was seen in cardiology office in 1/18 and was noted to be in rapid atrial fibrillation with significant volume overload.   Admitted 1/23 through 08/29/16 with marked volume overload, lower extremity edema, and atrial fibrillation with RVR.  Initial cardioversion failed and he was difficult to diurese. He was placed on milrinone for low output and diuresed with IV lasix with good response.  Weight came down considerably. Amiodarone was begun. Once fully diuresed, he had successful DC-CV on 08/28/16. Discharged on torsemide 60 mg daily. Discharge weight 212 pounds. Echo and TEE in 1/18 had shown fall in EF to around 25%.   He continued to lose weight as an outpatient.  He had a syncopal episode where he stood up from sitting and got lightheaded with transient loss of consciousness.  He had been having orthostatic symptoms.  We saw him in the office and cut back on torsemide from 60 mg daily down eventually to 20 mg daily.  Creatinine up some to 1.7 at that time.    At a prior appt, he was back in atrial fibrillation.  He was set up for DCCV in 3/18, but was in NSR when he arrived to short stay. He had atrial fibrillation ablation with Dr. Curt Bears in 5/18.   Echo was done in 5/18, EF up to 45% with mild diffuse hypokinesis, normal mechanical aortic valve, mildly decreased RV systolic function.    Echo was repeated in 5/19, EF 35-40%, diffuse hypokinesis, mildly decreased RV systolic function, stable mechanical aortic valve.   Echo in 11/21 showed EF up to 50-55%, normal RV size and systolic function, mechanical  aortic valve looks ok, normal IVC, ascending aorta 4.2 cm. Echo in 12/21 showed EF 60-65%, mild RV enlargement, normal RV function, mechanical aortic valve mean gradient 10 mmHg, ascending aorta 4.1 cm.  MRA chest in 1/22 showed 4.3 cm ascending aorta.   Follow up with Dr. Aundra Dubin 05/20/21 and volume overloaded. Torsemide 20 mg started daily x 5 days then instructed to reduce to every other day.  Today he returns for HF follow up. His breathing has improved and he is not longer dyspneic walking on flat ground. His cough is gone. Overall feeling fine. Denies CP, dizziness, edema, or PND/Orthopnea. Appetite ok. No fever or chills. Weight at home 205 pounds. Taking all medications.   ECG (personally reviewed): None ordered today.  REDS clip 27%  Labs (09/04/2016):  Hgb 15.9, K 5.0, Creatinine 1.77, digoxin 0.7, BNP 326  Labs (3/18): digoxin 0.8, K 4.8, creatinine 1.44, TSH normal, AST 54, ALT 49 Labs (4/18): K 4.3, creatinine 1.3 Labs (7/18): K 3.8, creatinine 0.9, hgb 11.9 Labs (11/18): K 4.9, creatinine 1.0, hgb 13.5 Labs (3/19): K 4.7,creatinine 0.99 Labs (5/19): K 4.6, creatinine 0.98 Labs (9/19): K 4.2, creatinine 0.91 Labs (12/19): K 4.6, creatinine 1.14 Labs (3/20): K 4.6, creatinine 0.98 Labs (7/20): creatinine 1.07 Labs (9/21): K 5, creatinine 1.13 Labs (12/21): LDL 77, K 4.1, creatinine 0.97 Labs (6/22): K 4.8, creatinine 1.02 Labs (10/22): K 4.9, creatinine 1.02  PMH: 1. Aortic stenosis: # 25 St Jude mechanical aortic valve in 9/14.   2. Chronic systolic CHF: Echo in 4/33 with EF 45-50%.  Admitted in 1/18 with afib with RVR, ?tachycardia-mediated cardiomyopathy.  - Echo (1/18) with EF 25-30%, stable mechanical aortic valve.  - TEE (1/18) with EF 20-25%, stable mechanical aortic valve, moderate Adrian, small PFO.  - Admission 1/18 with acute on chronic systolic CHF, required milrinone, extensively diuresed.  - Echo (5/18) with EF 45%, diffuse hypokinesis, normal RV size with mildly  decreased systolic function, mechanical aortic valve with mean gradient 14 mmHg, ascending aorta 4.4 cm.  - Echo (5/19) with EF 35-40%, diffuse hypokinesis, mild LVH, mildly decreased RV systolic function, mechanical aortic valve looked ok.   - Echo (11/20) with EF up to 50-55%, normal RV size and systolic function, mechanical aortic valve looks ok, normal IVC, ascending aorta 4.2 cm.  - Echo (12/21) with EF 60-65%, mild RV enlargement, normal RV function, mechanical aortic valve mean gradient 10 mmHg, ascending aorta 4.1 cm. 3. Atrial fibrillation: First noted in 2/95, uncertain how long it had been present (with RVR).  08/28/2016 Successful DC-CV --> NSR.  Back in atrial fibrillation at 09/18/16 office visit.  Set up for DCCV in 3/18 but back in NSR.   - Atrial fibrillation ablation in 5/18. Now off amiodarone.  4. 09/04/2016 Syncope--> over-diuresis most likely.  5. HTN 6. Ascending aortic aneurysm: 4.4 cm on 7/17 echo, 4.4 cm on 5/18 echo.  - MRA chest (4/19): 4.5 cm ascending aortic aneurysm.  - MRA chest (7/20): 4.5 cm ascending aortic aneurysm.  - MRA chest (1/22): 4.3 cm ascending aortic aneurysm (breast abnormality, recommend mammogram) 7. Prostate cancer: Treated with radioactive seed implantation.  8. Nephrolithiasis.  9. PFTs (4/18): Mild restriction.  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  Tobacco Use   Smoking status: Never   Smokeless tobacco: Never  Vaping Use   Vaping Use: Never used  Substance and Sexual Activity   Alcohol use: Yes    Alcohol/week: 3.0 standard drinks    Types: 3 Cans of beer per week    Comment:  "may have 2-3 beers on the  weekend"   Drug use: No   Sexual activity: Not on file  Other Topics Concern   Not on file  Social History Narrative   Not on file   Social Determinants of Health   Financial Resource Strain: Not on file  Food Insecurity: Not on file  Transportation Needs: Not on file  Physical Activity: Not on file  Stress: Not on file  Social Connections: Not on file  Intimate Partner Violence: Not on file    FH:  Family History  Problem Relation Age of Onset   Heart failure Mother    Diabetes Mother    Prostate cancer Father     Current Outpatient Medications  Medication Sig Dispense Refill   acetaminophen (TYLENOL) 500 MG tablet Take 500 mg by mouth at bedtime.     allopurinol (ZYLOPRIM) 300 MG tablet Take 300 mg by mouth daily as needed.     aspirin 81 MG  chewable tablet Chew 1 tablet (81 mg total) by mouth daily. 30 tablet 6   colchicine 0.6 MG tablet Take 0.6 mg by mouth daily as needed (for gout).      Docusate Calcium (STOOL SOFTENER PO) Take 1 tablet by mouth 2 (two) times daily.     ENTRESTO 24-26 MG TAKE 1 TABLET BY MOUTH TWICE DAILY 213 tablet 3   folic acid (FOLVITE) 1 MG tablet TAKE 1 TABLET(1 MG) BY MOUTH DAILY 90 tablet 3   metoprolol succinate (TOPROL-XL) 25 MG 24 hr tablet Take 1 tablet (25 mg total) by mouth at bedtime. 30 tablet 3   Potassium Chloride (KLOR-CON 10 PO) Patient takes 1 tablet by mouth every other day.     Psyllium (METAMUCIL PO) Take 2 capsules by mouth 2 (two) times daily.      tamsulosin (FLOMAX) 0.4 MG CAPS capsule Take 0.4 mg by mouth every other day.      torsemide (DEMADEX) 20 MG tablet Patient takes 1 tablet by mouth every other day.     Vibegron (GEMTESA PO) Take by mouth. Patient takes 1 tablet every other day.     warfarin (COUMADIN) 5 MG tablet Take 5 mg by mouth daily. Take 2.5 mg every Tuesday and Friday, take 5 mg all other days     No current facility-administered medications for this encounter.    BP 106/76   Pulse 87   Wt 94.9 kg  (209 lb 3.2 oz)   SpO2 96%   BMI 30.02 kg/m   Wt Readings from Last 3 Encounters:  06/08/21 94.9 kg (209 lb 3.2 oz)  05/20/21 96.3 kg (212 lb 6.4 oz)  12/30/20 93.7 kg (206 lb 9.6 oz)   PHYSICAL EXAM: General:  NAD. No resp difficulty HEENT: Normal Neck: Supple. No JVD. Carotids 2+ bilat; no bruits. No lymphadenopathy or thryomegaly appreciated. Cor: PMI nondisplaced. Regular rate & rhythm. No rubs, gallops, 2/6 SEM RUSB, + mechanical S2 Lungs: Clear Abdomen: Soft, nontender, nondistended. No hepatosplenomegaly. No bruits or masses. Good bowel sounds. Extremities: No cyanosis, clubbing, rash, edema Neuro: Alert & oriented x 3, cranial nerves grossly intact. Moves all 4 extremities w/o difficulty. Affect pleasant.  ASSESSMENT & PLAN: 1. Chronic systolic CHF: EF on TEE in 1/18 20-25%, mechanical aortic valve ok. 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  He required milrinone to assist diuresis during 1/18-2/18 admission.  Now that he is back in NSR s/p atrial fibrillation ablation, echo in 5/18 showed EF back up to 45%.  Echo in 5/19 showed mildly lower systolic function, EF 08-65%. Most recent echo in 12/21 showed EF up to 60-65%.  Improved NYHA class II symptoms, weight down 3 lbs. He is not volume overloaded on exam, ReDs 27%.  Medication titration has been limited by lightheadedness in the past, but no symptoms currently. - Repeat echo.  - Continue Entresto 24/26 bid.      - Continue Toprol XL 25 mg daily.   - Continue torsemide 20 mg every other day. BMET from 05/30/21 reviewed and looks OK.  2. Mechanical aortic valve: Continue warfarin and ASA 81. Valve looked ok on 12/21 echo. - Recent CBC stable 3. Atrial fibrillation: Admitted 1/18 with atrial fibrillation with RVR and CHF.  EF down to 20-25%.  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   Need to keep him in NSR. DCCV during  2/18 admission and now s/p atrial fibrillation ablation.  He is regular on exam today.  - He is now off amiodarone.   - Continue warfarin. No abnormal bleeding.  4. Ascending aortic aneurysm: 4.3 cm on 1/22 MRA chest.    Followup with Dr. Aundra Dubin in 3 months + echo.  [place] FNP 06/08/2021

## 2021-06-08 NOTE — Progress Notes (Signed)
ReDS Vest / Clip - 06/08/21 0900       ReDS Vest / Clip   Station Marker D    Ruler Value 37    ReDS Value Range Low volume    ReDS Actual Value 27

## 2021-06-08 NOTE — Patient Instructions (Addendum)
No Labs done today.   No medication changes were made. Please continue all current medications as prescribed.  Your physician recommends that you schedule a follow-up appointment in: 3 months with an echo prior to your exam  If you have any questions or concerns before your next appointment please send [place] a message through [place] or call our office at [phone].    TO LEAVE A MESSAGE FOR THE NURSE SELECT OPTION 2, PLEASE LEAVE A MESSAGE INCLUDING: YOUR NAME DATE OF BIRTH CALL BACK NUMBER REASON FOR CALL**this is important as we prioritize the call backs  YOU WILL RECEIVE A CALL BACK THE SAME DAY AS LONG AS YOU CALL BEFORE 4:00 PM   Do the following things EVERYDAY: Weigh yourself in the morning before breakfast. Write it down and keep it in a log. Take your medicines as prescribed Eat low salt foods--Limit salt (sodium) to 2000 mg per day.  Stay as active as you can everyday Limit all fluids for the day to less than 2 liters   At the [facility], you and your health needs are our priority. As part of our continuing mission to provide you with exceptional heart care, we have created designated Provider Care Teams. These Care Teams include your primary Cardiologist (physician) and Advanced Practice Providers (APPs- Physician Assistants and Nurse Practitioners) who all work together to provide you with the care you need, when you need it.   You may see any of the following providers on your designated Care Team at your next follow up: Dr Glori Bickers Dr Haynes Kerns, NP Lyda Jester, [place] Audry Riles, PharmD   Please be sure to bring in all your medications bottles to every appointment.

## 2021-06-15 ENCOUNTER — Other Ambulatory Visit (HOSPITAL_COMMUNITY): Payer: Self-pay | Admitting: Cardiology

## 2021-06-27 DIAGNOSIS — I480 Paroxysmal atrial fibrillation: Secondary | ICD-10-CM | POA: Diagnosis not present

## 2021-06-27 DIAGNOSIS — Z952 Presence of prosthetic heart valve: Secondary | ICD-10-CM | POA: Diagnosis not present

## 2021-06-27 DIAGNOSIS — Z7901 Long term (current) use of anticoagulants: Secondary | ICD-10-CM | POA: Diagnosis not present

## 2021-08-02 ENCOUNTER — Ambulatory Visit (HOSPITAL_COMMUNITY)
Admission: RE | Admit: 2021-08-02 | Discharge: 2021-08-02 | Disposition: A | Discharge status: Home / Self Care | Payer: Medicare Other | Source: Ambulatory Visit | Attending: Cardiology | Admitting: Cardiology

## 2021-08-02 ENCOUNTER — Other Ambulatory Visit: Payer: Self-pay

## 2021-08-02 ENCOUNTER — Ambulatory Visit (HOSPITAL_BASED_OUTPATIENT_CLINIC_OR_DEPARTMENT_OTHER)
Admission: RE | Admit: 2021-08-02 | Discharge: 2021-08-02 | Disposition: A | Discharge status: Home / Self Care | Payer: Medicare Other | Source: Ambulatory Visit | Attending: Cardiology | Admitting: Cardiology

## 2021-08-02 ENCOUNTER — Other Ambulatory Visit (HOSPITAL_COMMUNITY): Payer: Self-pay

## 2021-08-02 ENCOUNTER — Encounter (HOSPITAL_COMMUNITY): Payer: Self-pay | Admitting: Cardiology

## 2021-08-02 VITALS — BP 98/60 | HR 86 | Wt 208.8 lb

## 2021-08-02 DIAGNOSIS — E669 Obesity, unspecified: Secondary | ICD-10-CM | POA: Insufficient documentation

## 2021-08-02 DIAGNOSIS — I480 Paroxysmal atrial fibrillation: Secondary | ICD-10-CM

## 2021-08-02 DIAGNOSIS — I5043 Acute on chronic combined systolic (congestive) and diastolic (congestive) heart failure: Secondary | ICD-10-CM

## 2021-08-02 DIAGNOSIS — Z952 Presence of prosthetic heart valve: Secondary | ICD-10-CM | POA: Insufficient documentation

## 2021-08-02 DIAGNOSIS — Z7901 Long term (current) use of anticoagulants: Secondary | ICD-10-CM | POA: Diagnosis not present

## 2021-08-02 DIAGNOSIS — I5022 Chronic systolic (congestive) heart failure: Secondary | ICD-10-CM | POA: Insufficient documentation

## 2021-08-02 DIAGNOSIS — R0600 Dyspnea, unspecified: Secondary | ICD-10-CM | POA: Insufficient documentation

## 2021-08-02 DIAGNOSIS — Z79899 Other long term (current) drug therapy: Secondary | ICD-10-CM | POA: Insufficient documentation

## 2021-08-02 DIAGNOSIS — I110 Hypertensive heart disease with heart failure: Secondary | ICD-10-CM | POA: Diagnosis not present

## 2021-08-02 DIAGNOSIS — I7121 Aneurysm of the ascending aorta, without rupture: Secondary | ICD-10-CM | POA: Insufficient documentation

## 2021-08-02 DIAGNOSIS — I5042 Chronic combined systolic (congestive) and diastolic (congestive) heart failure: Secondary | ICD-10-CM

## 2021-08-02 DIAGNOSIS — I4891 Unspecified atrial fibrillation: Secondary | ICD-10-CM | POA: Insufficient documentation

## 2021-08-02 LAB — CBC
HCT: 41.3 % (ref 39.0–52.0)
Hemoglobin: 12.8 g/dL — ABNORMAL LOW (ref 13.0–17.0)
MCH: 19.1 pg — ABNORMAL LOW (ref 26.0–34.0)
MCHC: 31 g/dL (ref 30.0–36.0)
MCV: 61.7 fL — ABNORMAL LOW (ref 80.0–100.0)
Platelets: 210 10*3/uL (ref 150–400)
RBC: 6.69 MIL/uL — ABNORMAL HIGH (ref 4.22–5.81)
RDW: 19 % — ABNORMAL HIGH (ref 11.5–15.5)
WBC: 13.3 10*3/uL — ABNORMAL HIGH (ref 4.0–10.5)
nRBC: 0 % (ref 0.0–0.2)

## 2021-08-02 LAB — BASIC METABOLIC PANEL
Anion gap: 10 (ref 5–15)
BUN: 23 mg/dL (ref 8–23)
CO2: 26 mmol/L (ref 22–32)
Calcium: 9.1 mg/dL (ref 8.9–10.3)
Chloride: 100 mmol/L (ref 98–111)
Creatinine, Ser: 1.18 mg/dL (ref 0.61–1.24)
GFR, Estimated: >60 mL/min (ref >60)
Glucose, Bld: 129 mg/dL — ABNORMAL HIGH (ref 70–99)
Potassium: 4.5 mmol/L (ref 3.5–5.1)
Sodium: 136 mmol/L (ref 135–145)

## 2021-08-02 LAB — ECHOCARDIOGRAM COMPLETE
AR max vel: 1.94 cm2
AV Area VTI: 1.89 cm2
AV Area mean vel: 2.04 cm2
AV Mean grad: 7.6 mmHg
AV Peak grad: 13.3 mmHg
Ao pk vel: 1.82 m/s
S' Lateral: 3.9 cm

## 2021-08-02 LAB — BRAIN NATRIURETIC PEPTIDE: B Natriuretic Peptide: 912.4 pg/mL — ABNORMAL HIGH (ref 0.0–100.0)

## 2021-08-02 MED ORDER — POTASSIUM CHLORIDE ER 20 MEQ PO TBCR: 20.0000 meq | EXTENDED_RELEASE_TABLET | Freq: Every day | ORAL | 6 refills | Status: DC

## 2021-08-02 MED ORDER — DAPAGLIFLOZIN PROPANEDIOL 10 MG PO TABS: 10.0000 mg | ORAL_TABLET | Freq: Every day | ORAL | 6 refills | Status: DC

## 2021-08-02 NOTE — H&P (View-Only) (Signed)
PCP: Dr Rex Kras - Also manages INR Cardiology: Dr. Martinique  HF Cardiology: Dr Aundra Dubin   HPI: Adrian Lambert is a 81 y.o. male with a history of severe AS s/p St Jude mechanical AVR in 03/2003 on chronic coumadin, HTN, obesity, and mild LV dysfxn  EF 40-45% on 01/2016 echo.  Prior to admit in 1/18, he had a cough and dyspnea that was thought to be bronchitis so he was treated with abx and oral steroids. He was seen in cardiology office in 1/18 and was noted to be in rapid atrial fibrillation with significant volume overload.   Admitted 1/23 through 08/29/16 with marked volume overload, lower extremity edema, and atrial fibrillation with RVR.  Initial cardioversion failed and he was difficult to diurese. He was placed on milrinone for low output and diuresed with IV lasix with good response.  Weight came down considerably. Amiodarone was begun. Once fully diuresed, he had successful DC-CV on 08/28/16. Discharged on torsemide 60 mg daily. Discharge weight 212 pounds. Echo and TEE in 1/18 had shown fall in EF to around 25%.   He continued to lose weight as an outpatient.  He had a syncopal episode where he stood up from sitting and got lightheaded with transient loss of consciousness.  He had been having orthostatic symptoms.  We saw him in the office and cut back on torsemide from 60 mg daily down eventually to 20 mg daily.  Creatinine up some to 1.7 at that time.    At a prior appt, he was back in atrial fibrillation.  He was set up for DCCV in 3/18, but was in NSR when he arrived to short stay. He had atrial fibrillation ablation with Dr. Curt Bears in 5/18.   Echo was done in 5/18, EF up to 45% with mild diffuse hypokinesis, normal mechanical aortic valve, mildly decreased RV systolic function.    Echo was repeated in 5/19, EF 35-40%, diffuse hypokinesis, mildly decreased RV systolic function, stable mechanical aortic valve.   Echo in 11/21 showed EF up to 50-55%, normal RV size and systolic function, mechanical  aortic valve looks ok, normal IVC, ascending aorta 4.2 cm. Echo in 12/21 showed EF 60-65%, mild RV enlargement, normal RV function, mechanical aortic valve mean gradient 10 mmHg, ascending aorta 4.1 cm.  MRA chest in 1/22 showed 4.3 cm ascending aorta.   Echo was done today and reviewed, EF 35-40%, global hypokinesis, mildly decreased RV systolic function, normal mechanical aortic valve, moderate Adrian, PASP 51 mmHg.   Patient returns today for followup of CHF.  Weight is stable but he reports 2 weeks of increasing exertional dyspnea.  He has been short of breath walking around the house.  He has orthopnea and a chronic cough for the last couple weeks.  No chest pain.  No lightheadedness though BP is relatively low today.    ECG (personally reviewed): NSR, 1st degree AVB, iRBBB, PACs, old anterior MI, inferior Qs  REDS clip 44%  Labs (09/04/2016):  Hgb 15.9, K 5.0, Creatinine 1.77, digoxin 0.7, BNP 326  Labs (3/18): digoxin 0.8, K 4.8, creatinine 1.44, TSH normal, AST 54, ALT 49 Labs (4/18): K 4.3, creatinine 1.3 Labs (7/18): K 3.8, creatinine 0.9, hgb 11.9 Labs (11/18): K 4.9, creatinine 1.0, hgb 13.5 Labs (3/19): K 4.7,creatinine 0.99 Labs (5/19): K 4.6, creatinine 0.98 Labs (9/19): K 4.2, creatinine 0.91 Labs (12/19): K 4.6, creatinine 1.14 Labs (3/20): K 4.6, creatinine 0.98 Labs (7/20): creatinine 1.07 Labs (9/21): K 5, creatinine 1.13 Labs (12/21): LDL  77, K 4.1, creatinine 0.97 Labs (6/22): K 4.8, creatinine 1.02 Labs (10/22): K 4.9, creatinine 1.02 Labs (11/22): K 4.9, creatinine 1.02  PMH: 1. Aortic stenosis: # 25 St Jude mechanical aortic valve in 9/14.   2. Chronic systolic CHF: Echo in 8/18 with EF 45-50%.  Admitted in 1/18 with afib with RVR, ?tachycardia-mediated cardiomyopathy.  - Echo (1/18) with EF 25-30%, stable mechanical aortic valve.  - TEE (1/18) with EF 20-25%, stable mechanical aortic valve, moderate Adrian, small PFO.  - Admission 1/18 with acute on chronic systolic  CHF, required milrinone, extensively diuresed.  - Echo (5/18) with EF 45%, diffuse hypokinesis, normal RV size with mildly decreased systolic function, mechanical aortic valve with mean gradient 14 mmHg, ascending aorta 4.4 cm.  - Echo (5/19) with EF 35-40%, diffuse hypokinesis, mild LVH, mildly decreased RV systolic function, mechanical aortic valve looked ok.   - Echo (11/20) with EF up to 50-55%, normal RV size and systolic function, mechanical aortic valve looks ok, normal IVC, ascending aorta 4.2 cm.  - Echo (12/21) with EF 60-65%, mild RV enlargement, normal RV function, mechanical aortic valve mean gradient 10 mmHg, ascending aorta 4.1 cm. - Echo (1/23) with EF 35-40%, global hypokinesis, mildly decreased RV systolic function, normal mechanical aortic valve, moderate Adrian, PASP 51 mmHg.  3. Atrial fibrillation: First noted in 5/63, uncertain how long it had been present (with RVR).  08/28/2016 Successful DC-CV --> NSR.  Back in atrial fibrillation at 09/18/16 office visit.  Set up for DCCV in 3/18 but back in NSR.   - Atrial fibrillation ablation in 5/18. Now off amiodarone.  4. 09/04/2016 Syncope--> over-diuresis most likely.  5. HTN 6. Ascending aortic aneurysm: 4.4 cm on 7/17 echo, 4.4 cm on 5/18 echo.  - MRA chest (4/19): 4.5 cm ascending aortic aneurysm.  - MRA chest (7/20): 4.5 cm ascending aortic aneurysm.  - MRA chest (1/22): 4.3 cm ascending aortic aneurysm (breast abnormality, recommend mammogram) 7. Prostate cancer: Treated with radioactive seed implantation.  8. Nephrolithiasis.  9. PFTs (4/18): Mild restriction.  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   Tobacco Use   Smoking status: Never   Smokeless tobacco: Never  Vaping Use   Vaping Use: Never used  Substance and Sexual Activity   Alcohol use: Yes    Alcohol/week: 3.0 standard drinks    Types: 3 Cans of beer per week    Comment:  "may have 2-3 beers on the weekend"   Drug use: No   Sexual activity: Not on file  Other Topics Concern   Not on file  Social History Narrative   Not on file   Social Determinants of Health   Financial Resource Strain: Not on file  Food Insecurity: Not on file  Transportation Needs: Not on file  Physical Activity: Not on file  Stress: Not on file  Social Connections: Not on file  Intimate Partner Violence: Not on file    FH:  Family History  Problem Relation Age of Onset   Heart failure Mother    Diabetes Mother    Prostate cancer Father  Current Outpatient Medications  Medication Sig Dispense Refill   acetaminophen (TYLENOL) 500 MG tablet Take 500 mg by mouth at bedtime.     allopurinol (ZYLOPRIM) 300 MG tablet Take 300 mg by mouth daily.     aspirin 81 MG chewable tablet Chew 1 tablet (81 mg total) by mouth daily. 30 tablet 6   colchicine 0.6 MG tablet Take 0.6 mg by mouth daily as needed (for gout).      dapagliflozin propanediol (FARXIGA) 10 MG TABS tablet Take 1 tablet (10 mg total) by mouth daily before breakfast. 30 tablet 6   Docusate Calcium (STOOL SOFTENER PO) Take 1 tablet by mouth 2 (two) times daily.     ENTRESTO 24-26 MG TAKE 1 TABLET BY MOUTH TWICE DAILY 203 tablet 3   folic acid (FOLVITE) 1 MG tablet TAKE 1 TABLET(1 MG) BY MOUTH DAILY 90 tablet 3   metoprolol succinate (TOPROL-XL) 25 MG 24 hr tablet Take 1 tablet (25 mg total) by mouth at bedtime. 30 tablet 3   potassium chloride 20 MEQ TBCR Take 20 mEq by mouth daily. Patient takes 1 tablet by mouth every other day. 30 tablet 6   Psyllium (METAMUCIL PO) Take 2 capsules by mouth 2 (two) times daily.      tamsulosin (FLOMAX) 0.4 MG CAPS capsule Take 0.4 mg by mouth  every other day.      torsemide (DEMADEX) 20 MG tablet Patient takes 1 tablet by mouth every other day.     Vibegron (GEMTESA PO) Take by mouth. Patient takes 1 tablet every other day.     warfarin (COUMADIN) 5 MG tablet Take 5 mg by mouth daily. Take 2.5 mg every Tuesday and Friday, take 5 mg all other days     No current facility-administered medications for this encounter.    BP 98/60    Pulse 86    Wt 94.7 kg (208 lb 12.8 oz)    SpO2 96%    BMI 29.96 kg/m   Wt Readings from Last 3 Encounters:  08/02/21 94.7 kg (208 lb 12.8 oz)  06/08/21 94.9 kg (209 lb 3.2 oz)  05/20/21 96.3 kg (212 lb 6.4 oz)   PHYSICAL EXAM: General: NAD Neck: JVP 10 cm, no thyromegaly or thyroid nodule.  Lungs: Clear to auscultation bilaterally with normal respiratory effort. CV: Nondisplaced PMI.  Heart regular S1/S2 with mechanical S2, no S3/S4, 1/6 SEM RUSB.  1+ edema 1/2 to knees bilaterally.  No carotid bruit.  Normal pedal pulses.  Abdomen: Soft, nontender, no hepatosplenomegaly, no distention.  Skin: Intact without lesions or rashes.  Neurologic: Alert and oriented x 3.  Psych: Normal affect. Extremities: No clubbing or cyanosis.  HEENT: Normal.   ASSESSMENT & PLAN: 1. Chronic systolic CHF: EF on TEE in 1/18 20-25%, mechanical aortic valve ok. 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  He required milrinone to assist diuresis during 1/18-2/18 admission.  Now that he is back in NSR s/p atrial fibrillation ablation, echo in 5/18 showed EF back up to 45%.  Echo in 5/19 showed mildly lower systolic function, EF 55-97%. Echo in 12/21 showed EF up to 60-65%.  However, echo today showed EF back down to 35-40% with mild RV dysfunction.  He is not in atrial fibrillation today. NYHA class III symptoms with volume overload by exam and by Optivol.  - Increase torsemide to 40 mg daily x 4 days then 20 mg daily after that. Increase  KCl to 20  mEq daily. BMET/BNP today and BMET again in 10 days.  - Continue Entresto 24/26 bid.      - Continue Toprol XL 25 mg daily.   - Add Farxiga 10 mg daily.  - With worsening CHF and significant drop in EF from 60-65% to 35-40%, I will arrange for RHC/LHC.  He will need Lovenox bridge while off warfarin.  We discussed risks/benefits and he agrees to procedure.  2. Mechanical aortic valve: Continue warfarin and ASA 81. Valve looked ok on today's echo.  - CBC today.  3. Atrial fibrillation: Admitted 1/18 with atrial fibrillation with RVR and CHF.  EF down to 20-25%.  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  Need to keep him in NSR. DCCV during 2/18 admission and now s/p atrial fibrillation ablation.  He is in NSR with PACs today.  - He is now off amiodarone.   - Continue warfarin. No abnormal bleeding.  4. Ascending aortic aneurysm: 4.3 cm on 1/22 MRA chest.    Followup with APP in 3 wks.   Adrian Lambert  08/02/2021

## 2021-08-02 NOTE — Progress Notes (Signed)
°  Echocardiogram 2D Echocardiogram has been performed.  Adrian Lambert 08/02/2021, 10:53 AM

## 2021-08-02 NOTE — Patient Instructions (Signed)
Start farxiga 10mg  daily  Increase Potassium to 20Meq Daily  Increase Torsemide to 40mg  daily for 4 days, then return to 20mg  daily after that.  Labs done today, your results will be available in MyChart, we will contact you for abnormal readings.  Repeat Labs in 10 days.  You are scheduled for a Cardiac Catheterization on Thursday, January 19 with Dr. [name].  1. Please arrive at the [facility] (Main Entrance A) at [facility]: [address] [place], [place] [address] at 7:00 AM (This time is two hours before your procedure to ensure your preparation). Free valet parking service is available.   Special note: Every effort is made to have your procedure done on time. Please understand that emergencies sometimes delay scheduled procedures.  2. Diet: Do not eat solid foods after midnight.  The patient may have clear liquids until 5am upon the day of the procedure.  3. Labs: Done today. INR check morning of procedure  4. Medication instructions in preparation for your procedure:   Contrast Allergy: No  Stop taking, Torsemide (Demadex) Thursday, January 19,   On the morning of your procedure, take any morning medicines NOT listed above.  You may use sips of water.  5. Plan for one night stay--bring personal belongings. 6. Bring a current list of your medications and current insurance cards. 7. You MUST have a responsible person to drive you home. 8. Someone MUST be with you the first 24 hours after you arrive home or your discharge will be delayed. 9. Please wear clothes that are easy to get on and off and wear slip-on shoes.  Warfarin needs to be bridged to Lovenox for the procedure. We will call your PCP to get you for an appointment to check your INR and start the bridge.   If you have any questions or concerns before your next appointment please send [place] a message through [place] or call our office at [phone].    TO LEAVE A MESSAGE FOR THE NURSE SELECT  OPTION 2, PLEASE LEAVE A MESSAGE INCLUDING: YOUR NAME DATE OF BIRTH CALL BACK NUMBER REASON FOR CALL**this is important as we prioritize the call backs  YOU WILL RECEIVE A CALL BACK THE SAME DAY AS LONG AS YOU CALL BEFORE 4:00 PM At the [facility], you and your health needs are our priority. As part of our continuing mission to provide you with exceptional heart care, we have created designated Provider Care Teams. These Care Teams include your primary Cardiologist (physician) and Advanced Practice Providers (APPs- Physician Assistants and Nurse Practitioners) who all work together to provide you with the care you need, when you need it.   You may see any of the following providers on your designated Care Team at your next follow up: Dr Glori Bickers Dr Haynes Kerns, NP Lyda Jester, [place] [facility] [place], [place] Audry Riles, PharmD   Please be sure to bring in all your medications bottles to every appointment.

## 2021-08-02 NOTE — Progress Notes (Signed)
ReDS Vest / Clip - 08/02/21 1100       ReDS Vest / Clip   Station Marker D    Ruler Value 39    ReDS Value Range High volume overload    ReDS Actual Value 44

## 2021-08-02 NOTE — Progress Notes (Signed)
PCP: Dr Rex Kras - Also manages INR Cardiology: Dr. Martinique  HF Cardiology: Dr Aundra Dubin   HPI: Adrian Lambert is a 81 y.o. male with a history of severe AS s/p St Jude mechanical AVR in 03/2003 on chronic coumadin, HTN, obesity, and mild LV dysfxn  EF 40-45% on 01/2016 echo.  Prior to admit in 1/18, he had a cough and dyspnea that was thought to be bronchitis so he was treated with abx and oral steroids. He was seen in cardiology office in 1/18 and was noted to be in rapid atrial fibrillation with significant volume overload.   Admitted 1/23 through 08/29/16 with marked volume overload, lower extremity edema, and atrial fibrillation with RVR.  Initial cardioversion failed and he was difficult to diurese. He was placed on milrinone for low output and diuresed with IV lasix with good response.  Weight came down considerably. Amiodarone was begun. Once fully diuresed, he had successful DC-CV on 08/28/16. Discharged on torsemide 60 mg daily. Discharge weight 212 pounds. Echo and TEE in 1/18 had shown fall in EF to around 25%.   He continued to lose weight as an outpatient.  He had a syncopal episode where he stood up from sitting and got lightheaded with transient loss of consciousness.  He had been having orthostatic symptoms.  We saw him in the office and cut back on torsemide from 60 mg daily down eventually to 20 mg daily.  Creatinine up some to 1.7 at that time.    At a prior appt, he was back in atrial fibrillation.  He was set up for DCCV in 3/18, but was in NSR when he arrived to short stay. He had atrial fibrillation ablation with Dr. Curt Bears in 5/18.   Echo was done in 5/18, EF up to 45% with mild diffuse hypokinesis, normal mechanical aortic valve, mildly decreased RV systolic function.    Echo was repeated in 5/19, EF 35-40%, diffuse hypokinesis, mildly decreased RV systolic function, stable mechanical aortic valve.   Echo in 11/21 showed EF up to 50-55%, normal RV size and systolic function, mechanical  aortic valve looks ok, normal IVC, ascending aorta 4.2 cm. Echo in 12/21 showed EF 60-65%, mild RV enlargement, normal RV function, mechanical aortic valve mean gradient 10 mmHg, ascending aorta 4.1 cm.  MRA chest in 1/22 showed 4.3 cm ascending aorta.   Echo was done today and reviewed, EF 35-40%, global hypokinesis, mildly decreased RV systolic function, normal mechanical aortic valve, moderate Adrian, PASP 51 mmHg.   Patient returns today for followup of CHF.  Weight is stable but he reports 2 weeks of increasing exertional dyspnea.  He has been short of breath walking around the house.  He has orthopnea and a chronic cough for the last couple weeks.  No chest pain.  No lightheadedness though BP is relatively low today.    ECG (personally reviewed): NSR, 1st degree AVB, iRBBB, PACs, old anterior MI, inferior Qs  REDS clip 44%  Labs (09/04/2016):  Hgb 15.9, K 5.0, Creatinine 1.77, digoxin 0.7, BNP 326  Labs (3/18): digoxin 0.8, K 4.8, creatinine 1.44, TSH normal, AST 54, ALT 49 Labs (4/18): K 4.3, creatinine 1.3 Labs (7/18): K 3.8, creatinine 0.9, hgb 11.9 Labs (11/18): K 4.9, creatinine 1.0, hgb 13.5 Labs (3/19): K 4.7,creatinine 0.99 Labs (5/19): K 4.6, creatinine 0.98 Labs (9/19): K 4.2, creatinine 0.91 Labs (12/19): K 4.6, creatinine 1.14 Labs (3/20): K 4.6, creatinine 0.98 Labs (7/20): creatinine 1.07 Labs (9/21): K 5, creatinine 1.13 Labs (12/21): LDL  77, K 4.1, creatinine 0.97 Labs (6/22): K 4.8, creatinine 1.02 Labs (10/22): K 4.9, creatinine 1.02 Labs (11/22): K 4.9, creatinine 1.02  PMH: 1. Aortic stenosis: # 25 St Jude mechanical aortic valve in 9/14.   2. Chronic systolic CHF: Echo in 7/03 with EF 45-50%.  Admitted in 1/18 with afib with RVR, ?tachycardia-mediated cardiomyopathy.  - Echo (1/18) with EF 25-30%, stable mechanical aortic valve.  - TEE (1/18) with EF 20-25%, stable mechanical aortic valve, moderate Adrian, small PFO.  - Admission 1/18 with acute on chronic systolic  CHF, required milrinone, extensively diuresed.  - Echo (5/18) with EF 45%, diffuse hypokinesis, normal RV size with mildly decreased systolic function, mechanical aortic valve with mean gradient 14 mmHg, ascending aorta 4.4 cm.  - Echo (5/19) with EF 35-40%, diffuse hypokinesis, mild LVH, mildly decreased RV systolic function, mechanical aortic valve looked ok.   - Echo (11/20) with EF up to 50-55%, normal RV size and systolic function, mechanical aortic valve looks ok, normal IVC, ascending aorta 4.2 cm.  - Echo (12/21) with EF 60-65%, mild RV enlargement, normal RV function, mechanical aortic valve mean gradient 10 mmHg, ascending aorta 4.1 cm. - Echo (1/23) with EF 35-40%, global hypokinesis, mildly decreased RV systolic function, normal mechanical aortic valve, moderate Adrian, PASP 51 mmHg.  3. Atrial fibrillation: First noted in 5/00, uncertain how long it had been present (with RVR).  08/28/2016 Successful DC-CV --> NSR.  Back in atrial fibrillation at 09/18/16 office visit.  Set up for DCCV in 3/18 but back in NSR.   - Atrial fibrillation ablation in 5/18. Now off amiodarone.  4. 09/04/2016 Syncope--> over-diuresis most likely.  5. HTN 6. Ascending aortic aneurysm: 4.4 cm on 7/17 echo, 4.4 cm on 5/18 echo.  - MRA chest (4/19): 4.5 cm ascending aortic aneurysm.  - MRA chest (7/20): 4.5 cm ascending aortic aneurysm.  - MRA chest (1/22): 4.3 cm ascending aortic aneurysm (breast abnormality, recommend mammogram) 7. Prostate cancer: Treated with radioactive seed implantation.  8. Nephrolithiasis.  9. PFTs (4/18): Mild restriction.  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   Tobacco Use   Smoking status: Never   Smokeless tobacco: Never  Vaping Use   Vaping Use: Never used  Substance and Sexual Activity   Alcohol use: Yes    Alcohol/week: 3.0 standard drinks    Types: 3 Cans of beer per week    Comment:  "may have 2-3 beers on the weekend"   Drug use: No   Sexual activity: Not on file  Other Topics Concern   Not on file  Social History Narrative   Not on file   Social Determinants of Health   Financial Resource Strain: Not on file  Food Insecurity: Not on file  Transportation Needs: Not on file  Physical Activity: Not on file  Stress: Not on file  Social Connections: Not on file  Intimate Partner Violence: Not on file    FH:  Family History  Problem Relation Age of Onset   Heart failure Mother    Diabetes Mother    Prostate cancer Father  Current Outpatient Medications  Medication Sig Dispense Refill   acetaminophen (TYLENOL) 500 MG tablet Take 500 mg by mouth at bedtime.     allopurinol (ZYLOPRIM) 300 MG tablet Take 300 mg by mouth daily.     aspirin 81 MG chewable tablet Chew 1 tablet (81 mg total) by mouth daily. 30 tablet 6   colchicine 0.6 MG tablet Take 0.6 mg by mouth daily as needed (for gout).      dapagliflozin propanediol (FARXIGA) 10 MG TABS tablet Take 1 tablet (10 mg total) by mouth daily before breakfast. 30 tablet 6   Docusate Calcium (STOOL SOFTENER PO) Take 1 tablet by mouth 2 (two) times daily.     ENTRESTO 24-26 MG TAKE 1 TABLET BY MOUTH TWICE DAILY 161 tablet 3   folic acid (FOLVITE) 1 MG tablet TAKE 1 TABLET(1 MG) BY MOUTH DAILY 90 tablet 3   metoprolol succinate (TOPROL-XL) 25 MG 24 hr tablet Take 1 tablet (25 mg total) by mouth at bedtime. 30 tablet 3   potassium chloride 20 MEQ TBCR Take 20 mEq by mouth daily. Patient takes 1 tablet by mouth every other day. 30 tablet 6   Psyllium (METAMUCIL PO) Take 2 capsules by mouth 2 (two) times daily.      tamsulosin (FLOMAX) 0.4 MG CAPS capsule Take 0.4 mg by mouth  every other day.      torsemide (DEMADEX) 20 MG tablet Patient takes 1 tablet by mouth every other day.     Vibegron (GEMTESA PO) Take by mouth. Patient takes 1 tablet every other day.     warfarin (COUMADIN) 5 MG tablet Take 5 mg by mouth daily. Take 2.5 mg every Tuesday and Friday, take 5 mg all other days     No current facility-administered medications for this encounter.    BP 98/60    Pulse 86    Wt 94.7 kg (208 lb 12.8 oz)    SpO2 96%    BMI 29.96 kg/m   Wt Readings from Last 3 Encounters:  08/02/21 94.7 kg (208 lb 12.8 oz)  06/08/21 94.9 kg (209 lb 3.2 oz)  05/20/21 96.3 kg (212 lb 6.4 oz)   PHYSICAL EXAM: General: NAD Neck: JVP 10 cm, no thyromegaly or thyroid nodule.  Lungs: Clear to auscultation bilaterally with normal respiratory effort. CV: Nondisplaced PMI.  Heart regular S1/S2 with mechanical S2, no S3/S4, 1/6 SEM RUSB.  1+ edema 1/2 to knees bilaterally.  No carotid bruit.  Normal pedal pulses.  Abdomen: Soft, nontender, no hepatosplenomegaly, no distention.  Skin: Intact without lesions or rashes.  Neurologic: Alert and oriented x 3.  Psych: Normal affect. Extremities: No clubbing or cyanosis.  HEENT: Normal.   ASSESSMENT & PLAN: 1. Chronic systolic CHF: EF on TEE in 1/18 20-25%, mechanical aortic valve ok. 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  He required milrinone to assist diuresis during 1/18-2/18 admission.  Now that he is back in NSR s/p atrial fibrillation ablation, echo in 5/18 showed EF back up to 45%.  Echo in 5/19 showed mildly lower systolic function, EF 09-60%. Echo in 12/21 showed EF up to 60-65%.  However, echo today showed EF back down to 35-40% with mild RV dysfunction.  He is not in atrial fibrillation today. NYHA class III symptoms with volume overload by exam and by Optivol.  - Increase torsemide to 40 mg daily x 4 days then 20 mg daily after that. Increase  KCl to 20  mEq daily. BMET/BNP today and BMET again in 10 days.  - Continue Entresto 24/26 bid.      - Continue Toprol XL 25 mg daily.   - Add Farxiga 10 mg daily.  - With worsening CHF and significant drop in EF from 60-65% to 35-40%, I will arrange for RHC/LHC.  He will need Lovenox bridge while off warfarin.  We discussed risks/benefits and he agrees to procedure.  2. Mechanical aortic valve: Continue warfarin and ASA 81. Valve looked ok on today's echo.  - CBC today.  3. Atrial fibrillation: Admitted 1/18 with atrial fibrillation with RVR and CHF.  EF down to 20-25%.  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  Need to keep him in NSR. DCCV during 2/18 admission and now s/p atrial fibrillation ablation.  He is in NSR with PACs today.  - He is now off amiodarone.   - Continue warfarin. No abnormal bleeding.  4. Ascending aortic aneurysm: 4.3 cm on 1/22 MRA chest.    Followup with APP in 3 wks.   Adrian Lambert  08/02/2021

## 2021-08-04 ENCOUNTER — Telehealth (HOSPITAL_COMMUNITY): Payer: Self-pay

## 2021-08-04 ENCOUNTER — Other Ambulatory Visit (HOSPITAL_COMMUNITY): Payer: Self-pay

## 2021-08-04 DIAGNOSIS — Z7901 Long term (current) use of anticoagulants: Secondary | ICD-10-CM | POA: Diagnosis not present

## 2021-08-04 MED ORDER — TORSEMIDE 20 MG PO TABS: 20.0000 mg | ORAL_TABLET | ORAL | 11 refills | Status: DC

## 2021-08-04 NOTE — Telephone Encounter (Signed)
Spoke with April from PCP office in relation to his anticoagulation. INR this am is 3.7, sending script to his pharmacy, so he start the bridge of Heparin prior to his heart Catheterization next week.

## 2021-08-07 ENCOUNTER — Other Ambulatory Visit: Payer: Self-pay | Admitting: Physician Assistant

## 2021-08-07 MED ORDER — ENOXAPARIN SODIUM 100 MG/ML IJ SOSY: 90.0000 mg | PREFILLED_SYRINGE | Freq: Two times a day (BID) | INTRAMUSCULAR | Status: AC

## 2021-08-07 NOTE — Progress Notes (Signed)
° °  Patient called because he is supposed to start a Lovenox bridge tomorrow morning.  There is a phone note in the chart regarding this.  His INR was 3.7, so he was recommended to hold his Coumadin, and start a Lovenox bridge on Monday.  He has done a Lovenox bridge before.  The RN that wrote the note says she was sending the prescription to his pharmacy, but the patient says he called the pharmacy and it is not there.  He states his weight is 80 kg, so the dose will be 90 mg.  I sent a prescription for Lovenox, 90 mg, to start tomorrow.  I gave him 7 doses, so if he is supposed to take a dose on Thursday, he has it.  He is to call the office early next week and clarify if he is supposed to take a dose on Thursday morning.  Rosaria Ferries, PA-C 08/07/2021 1:09 PM

## 2021-08-09 DIAGNOSIS — Z7901 Long term (current) use of anticoagulants: Secondary | ICD-10-CM | POA: Diagnosis not present

## 2021-08-09 DIAGNOSIS — I480 Paroxysmal atrial fibrillation: Secondary | ICD-10-CM | POA: Diagnosis not present

## 2021-08-11 ENCOUNTER — Other Ambulatory Visit: Payer: Self-pay

## 2021-08-11 ENCOUNTER — Other Ambulatory Visit (HOSPITAL_COMMUNITY): Payer: Self-pay

## 2021-08-11 ENCOUNTER — Encounter (HOSPITAL_COMMUNITY)
Admission: RE | Disposition: A | Discharge status: Home / Self Care | Payer: Self-pay | Source: Home / Self Care | Attending: Cardiology

## 2021-08-11 ENCOUNTER — Ambulatory Visit (HOSPITAL_COMMUNITY)
Admission: RE | Admit: 2021-08-11 | Discharge: 2021-08-11 | Disposition: A | Discharge status: Home / Self Care | Payer: Medicare Other | Attending: Cardiology | Admitting: Cardiology

## 2021-08-11 DIAGNOSIS — Z7901 Long term (current) use of anticoagulants: Secondary | ICD-10-CM | POA: Diagnosis not present

## 2021-08-11 DIAGNOSIS — I428 Other cardiomyopathies: Secondary | ICD-10-CM | POA: Diagnosis not present

## 2021-08-11 DIAGNOSIS — I509 Heart failure, unspecified: Secondary | ICD-10-CM | POA: Diagnosis not present

## 2021-08-11 DIAGNOSIS — I5043 Acute on chronic combined systolic (congestive) and diastolic (congestive) heart failure: Secondary | ICD-10-CM

## 2021-08-11 DIAGNOSIS — I7121 Aneurysm of the ascending aorta, without rupture: Secondary | ICD-10-CM | POA: Insufficient documentation

## 2021-08-11 DIAGNOSIS — Z952 Presence of prosthetic heart valve: Secondary | ICD-10-CM | POA: Diagnosis not present

## 2021-08-11 DIAGNOSIS — I350 Nonrheumatic aortic (valve) stenosis: Secondary | ICD-10-CM | POA: Diagnosis not present

## 2021-08-11 DIAGNOSIS — I4891 Unspecified atrial fibrillation: Secondary | ICD-10-CM | POA: Diagnosis not present

## 2021-08-11 DIAGNOSIS — I272 Pulmonary hypertension, unspecified: Secondary | ICD-10-CM | POA: Insufficient documentation

## 2021-08-11 DIAGNOSIS — I5022 Chronic systolic (congestive) heart failure: Secondary | ICD-10-CM | POA: Insufficient documentation

## 2021-08-11 DIAGNOSIS — I2510 Atherosclerotic heart disease of native coronary artery without angina pectoris: Secondary | ICD-10-CM | POA: Insufficient documentation

## 2021-08-11 DIAGNOSIS — Z7982 Long term (current) use of aspirin: Secondary | ICD-10-CM | POA: Diagnosis not present

## 2021-08-11 DIAGNOSIS — I110 Hypertensive heart disease with heart failure: Secondary | ICD-10-CM | POA: Insufficient documentation

## 2021-08-11 DIAGNOSIS — Z79899 Other long term (current) drug therapy: Secondary | ICD-10-CM | POA: Diagnosis not present

## 2021-08-11 HISTORY — PX: RIGHT/LEFT HEART CATH AND CORONARY ANGIOGRAPHY: CATH118266

## 2021-08-11 LAB — POCT I-STAT EG7
Acid-Base Excess: 3 mmol/L — ABNORMAL HIGH (ref 0.0–2.0)
Acid-Base Excess: 3 mmol/L — ABNORMAL HIGH (ref 0.0–2.0)
Bicarbonate: 28 mmol/L (ref 20.0–28.0)
Bicarbonate: 27.6 mmol/L (ref 20.0–28.0)
Calcium, Ion: 1.19 mmol/L (ref 1.15–1.40)
Calcium, Ion: 1.12 mmol/L — ABNORMAL LOW (ref 1.15–1.40)
HCT: 42 % (ref 39.0–52.0)
HCT: 41 % (ref 39.0–52.0)
Hemoglobin: 14.3 g/dL (ref 13.0–17.0)
Hemoglobin: 13.9 g/dL (ref 13.0–17.0)
O2 Saturation: 63 %
O2 Saturation: 64 %
Potassium: 4.3 mmol/L (ref 3.5–5.1)
Potassium: 4.1 mmol/L (ref 3.5–5.1)
Sodium: 138 mmol/L (ref 135–145)
Sodium: 139 mmol/L (ref 135–145)
TCO2: 29 mmol/L (ref 22–32)
TCO2: 29 mmol/L (ref 22–32)
pCO2, Ven: 44.7 mmHg (ref 44.0–60.0)
pCO2, Ven: 43.3 mmHg — ABNORMAL LOW (ref 44.0–60.0)
pH, Ven: 7.405 (ref 7.250–7.430)
pH, Ven: 7.412 (ref 7.250–7.430)
pO2, Ven: 33 mmHg (ref 32.0–45.0)
pO2, Ven: 33 mmHg (ref 32.0–45.0)

## 2021-08-11 LAB — PROTIME-INR
INR: 1.1 (ref 0.8–1.2)
Prothrombin Time: 14.7 seconds (ref 11.4–15.2)

## 2021-08-11 SURGERY — RIGHT/LEFT HEART CATH AND CORONARY ANGIOGRAPHY
Anesthesia: LOCAL

## 2021-08-11 MED ORDER — ENOXAPARIN SODIUM 80 MG/0.8ML IJ SOSY
80.0000 mg | PREFILLED_SYRINGE | Freq: Two times a day (BID) | INTRAMUSCULAR | 0 refills | Status: DC
Start: 2021-08-11 — End: 2021-08-24

## 2021-08-11 MED ORDER — MIDAZOLAM HCL 2 MG/2ML IJ SOLN
INTRAMUSCULAR | Status: AC
  Filled 2021-08-11: qty 2

## 2021-08-11 MED ORDER — ONDANSETRON HCL 4 MG/2ML IJ SOLN: 4.0000 mg | Freq: Four times a day (QID) | INTRAMUSCULAR | Status: DC | PRN

## 2021-08-11 MED ORDER — ASPIRIN 81 MG PO CHEW: 81.0000 mg | CHEWABLE_TABLET | Freq: Once | ORAL | Status: DC

## 2021-08-11 MED ORDER — LIDOCAINE HCL (PF) 1 % IJ SOLN
INTRAMUSCULAR | Status: DC | PRN
  Administered 2021-08-11 (×2): 2 mL

## 2021-08-11 MED ORDER — SODIUM CHLORIDE 0.9% FLUSH: 3.0000 mL | Freq: Two times a day (BID) | INTRAVENOUS | Status: DC

## 2021-08-11 MED ORDER — VERAPAMIL HCL 2.5 MG/ML IV SOLN: INTRAVENOUS | Status: DC | PRN

## 2021-08-11 MED ORDER — WARFARIN SODIUM 5 MG PO TABS: 2.5000 mg | ORAL_TABLET | ORAL | 11 refills | Status: DC

## 2021-08-11 MED ORDER — FENTANYL CITRATE (PF) 100 MCG/2ML IJ SOLN
INTRAMUSCULAR | Status: DC | PRN
  Administered 2021-08-11: 25 ug via INTRAVENOUS

## 2021-08-11 MED ORDER — LIDOCAINE HCL (PF) 1 % IJ SOLN
INTRAMUSCULAR | Status: AC
  Filled 2021-08-11: qty 30

## 2021-08-11 MED ORDER — FENTANYL CITRATE (PF) 100 MCG/2ML IJ SOLN
INTRAMUSCULAR | Status: AC
  Filled 2021-08-11: qty 2

## 2021-08-11 MED ORDER — SODIUM CHLORIDE 0.9% FLUSH: 3.0000 mL | INTRAVENOUS | Status: DC | PRN

## 2021-08-11 MED ORDER — HEPARIN (PORCINE) IN NACL 1000-0.9 UT/500ML-% IV SOLN
INTRAVENOUS | Status: AC
  Filled 2021-08-11: qty 500

## 2021-08-11 MED ORDER — SODIUM CHLORIDE 0.9 % IV SOLN: INTRAVENOUS | Status: DC

## 2021-08-11 MED ORDER — HYDRALAZINE HCL 20 MG/ML IJ SOLN: 10.0000 mg | INTRAMUSCULAR | Status: DC | PRN

## 2021-08-11 MED ORDER — MIDAZOLAM HCL 2 MG/2ML IJ SOLN
INTRAMUSCULAR | Status: DC | PRN
  Administered 2021-08-11: .5 mg via INTRAVENOUS

## 2021-08-11 MED ORDER — HEPARIN SODIUM (PORCINE) 1000 UNIT/ML IJ SOLN
INTRAMUSCULAR | Status: DC | PRN
  Administered 2021-08-11: 4000 [IU] via INTRAVENOUS

## 2021-08-11 MED ORDER — LABETALOL HCL 5 MG/ML IV SOLN: 10.0000 mg | INTRAVENOUS | Status: DC | PRN

## 2021-08-11 MED ORDER — HEPARIN (PORCINE) IN NACL 1000-0.9 UT/500ML-% IV SOLN
INTRAVENOUS | Status: DC | PRN
  Administered 2021-08-11 (×2): 500 mL

## 2021-08-11 MED ORDER — HEPARIN SODIUM (PORCINE) 1000 UNIT/ML IJ SOLN
INTRAMUSCULAR | Status: AC
  Filled 2021-08-11: qty 10

## 2021-08-11 MED ORDER — IOHEXOL 350 MG/ML SOLN
INTRAVENOUS | Status: DC | PRN
  Administered 2021-08-11: 90 mL

## 2021-08-11 MED ORDER — SODIUM CHLORIDE 0.9 % IV SOLN: 250.0000 mL | INTRAVENOUS | Status: DC | PRN

## 2021-08-11 MED ORDER — ACETAMINOPHEN 325 MG PO TABS: 650.0000 mg | ORAL_TABLET | ORAL | Status: DC | PRN

## 2021-08-11 MED ORDER — VERAPAMIL HCL 2.5 MG/ML IV SOLN
INTRAVENOUS | Status: AC
  Filled 2021-08-11: qty 2

## 2021-08-11 SURGICAL SUPPLY — 13 items
CATH 5FR JL3.5 JR4 ANG PIG MP (CATHETERS) ×1 IMPLANT
CATH BALLN WEDGE 5F 110CM (CATHETERS) ×1 IMPLANT
CATH INFINITI 5 FR 3DRC (CATHETERS) ×1 IMPLANT
DEVICE RAD COMP TR BAND LRG (VASCULAR PRODUCTS) ×1 IMPLANT
ELECT DEFIB PAD ADLT CADENCE (PAD) ×1 IMPLANT
GLIDESHEATH SLEND SS 6F .021 (SHEATH) ×1 IMPLANT
GUIDEWIRE .025 260CM (WIRE) ×1 IMPLANT
GUIDEWIRE INQWIRE 1.5J.035X260 (WIRE) IMPLANT
INQWIRE 1.5J .035X260CM (WIRE) ×2
KIT HEART LEFT (KITS) ×3 IMPLANT
PACK CARDIAC CATHETERIZATION (CUSTOM PROCEDURE TRAY) ×3 IMPLANT
SHEATH GLIDE SLENDER 4/5FR (SHEATH) ×1 IMPLANT
TRANSDUCER W/STOPCOCK (MISCELLANEOUS) ×3 IMPLANT

## 2021-08-11 NOTE — TOC Benefit Eligibility Note (Signed)
Patient Teacher, English as a foreign language completed.    The patient is currently admitted and upon discharge could be taking enoxaparin (Lovenox) 100mg /ml.  The current 30 day co-pay is, $90.00.   The patient is insured through  [employer] Medicare Part D    Lyndel Safe, [place] Patient Advocate Specialist [place] Patient Advocate Team Direct Number: [phone]  Fax: [phone]

## 2021-08-11 NOTE — Discharge Instructions (Signed)
Patient will need to restart Lovenox and warfarin this evening.  He will take Lovenox as directed by pharmacist and coumadin clinic.

## 2021-08-11 NOTE — Interval H&P Note (Signed)
History and Physical Interval Note:  08/11/2021 9:04 AM  Adrian Lambert  has presented today for surgery, with the diagnosis of heart failure.  The various methods of treatment have been discussed with the patient and family. After consideration of risks, benefits and other options for treatment, the patient has consented to  Procedure(s): RIGHT/LEFT HEART CATH AND CORONARY ANGIOGRAPHY (N/A) as a surgical intervention.  The patient's history has been reviewed, patient examined, no change in status, stable for surgery.  I have reviewed the patient's chart and labs.  Questions were answered to the patient's satisfaction.     Zarian Colpitts [employer]

## 2021-08-12 ENCOUNTER — Encounter (HOSPITAL_COMMUNITY): Payer: Self-pay | Admitting: Cardiology

## 2021-08-15 DIAGNOSIS — I480 Paroxysmal atrial fibrillation: Secondary | ICD-10-CM | POA: Diagnosis not present

## 2021-08-15 DIAGNOSIS — Z7901 Long term (current) use of anticoagulants: Secondary | ICD-10-CM | POA: Diagnosis not present

## 2021-08-15 DIAGNOSIS — R58 Hemorrhage, not elsewhere classified: Secondary | ICD-10-CM | POA: Diagnosis not present

## 2021-08-23 NOTE — Progress Notes (Signed)
PCP: Dr Rex Kras - Also manages INR Cardiology: Dr. Martinique  HF Cardiology: Dr Aundra Dubin   HPI: Adrian Lambert is a 81 y.o. male with a history of severe AS s/p St Jude mechanical AVR in 03/2003 on chronic coumadin, HTN, obesity, and mild LV dysfxn  EF 40-45% on 01/2016 echo.  Prior to admit in 1/18, he had a cough and dyspnea that was thought to be bronchitis so he was treated with abx and oral steroids. He was seen in cardiology office in 1/18 and was noted to be in rapid atrial fibrillation with significant volume overload.   Admitted 1/23 through 08/29/16 with marked volume overload, lower extremity edema, and atrial fibrillation with RVR.  Initial cardioversion failed and he was difficult to diurese. He was placed on milrinone for low output and diuresed with IV lasix with good response.  Weight came down considerably. Amiodarone was begun. Once fully diuresed, he had successful DC-CV on 08/28/16. Discharged on torsemide 60 mg daily. Discharge weight 212 pounds. Echo and TEE in 1/18 had shown fall in EF to around 25%.   He continued to lose weight as an outpatient.  He had a syncopal episode where he stood up from sitting and got lightheaded with transient loss of consciousness.  He had been having orthostatic symptoms.  We saw him in the office and cut back on torsemide from 60 mg daily down eventually to 20 mg daily.  Creatinine up some to 1.7 at that time.    At a prior appt, he was back in atrial fibrillation.  He was set up for DCCV in 3/18, but was in NSR when he arrived to short stay. He had atrial fibrillation ablation with Dr. Curt Bears in 5/18.   Echo was done in 5/18, EF up to 45% with mild diffuse hypokinesis, normal mechanical aortic valve, mildly decreased RV systolic function.    Echo was repeated in 5/19, EF 35-40%, diffuse hypokinesis, mildly decreased RV systolic function, stable mechanical aortic valve.   Echo in 11/21 showed EF up to 50-55%, normal RV size and systolic function, mechanical  aortic valve looks ok, normal IVC, ascending aorta 4.2 cm. Echo in 12/21 showed EF 60-65%, mild RV enlargement, normal RV function, mechanical aortic valve mean gradient 10 mmHg, ascending aorta 4.1 cm.  MRA chest in 1/22 showed 4.3 cm ascending aorta.   Echo 1/23 showed EF 35-40%, global hypokinesis, mildly decreased RV systolic function, normal mechanical aortic valve, moderate Adrian, PASP 51 mmHg. With EF back down and mild volume overload, he was started on Farxiga, torsemide increased and R/LHC arranged.  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  Today he returns for post- cath HF follow up. Overall feeling fine. He is SOB with stairs or walking further distances but otherwise feeling well. Denies abnormal bleeding, palpitations, CP, dizziness, edema, or PND/Orthopnea. Appetite ok. No fever or chills. Weight at home 200-204 pounds. Taking all medications. Concerned about 2 knots on his stomach that appeared after he completed his Lovenox shots.  ECG (personally reviewed): none ordered today.  Labs (09/04/2016):  Hgb 15.9, K 5.0, Creatinine 1.77, digoxin 0.7, BNP 326  Labs (3/18): digoxin 0.8, K 4.8, creatinine 1.44, TSH normal, AST 54, ALT 49 Labs (4/18): K 4.3, creatinine 1.3 Labs (7/18): K 3.8, creatinine 0.9, hgb 11.9 Labs (11/18): K 4.9, creatinine 1.0, hgb 13.5 Labs (3/19): K 4.7,creatinine 0.99 Labs (5/19): K 4.6, creatinine 0.98 Labs (9/19): K 4.2, creatinine 0.91 Labs (  12/19): K 4.6, creatinine 1.14 Labs (3/20): K 4.6, creatinine 0.98 Labs (7/20): creatinine 1.07 Labs (9/21): K 5, creatinine 1.13 Labs (12/21): LDL 77, K 4.1, creatinine 0.97 Labs (6/22): K 4.8, creatinine 1.02 Labs (10/22): K 4.9, creatinine 1.02 Labs (11/22): K 4.9, creatinine 1.02 Labs (1/23): K 4.5, creatinine 1/18  PMH: 1. Aortic stenosis: # 25 St Jude mechanical aortic valve in 9/14.   2. Chronic systolic CHF: Echo in 2/62 with EF  45-50%.  Admitted in 1/18 with afib with RVR, ?tachycardia-mediated cardiomyopathy.  - Echo (1/18) with EF 25-30%, stable mechanical aortic valve.  - TEE (1/18) with EF 20-25%, stable mechanical aortic valve, moderate Adrian, small PFO.  - Admission 1/18 with acute on chronic systolic CHF, required milrinone, extensively diuresed.  - Echo (5/18) with EF 45%, diffuse hypokinesis, normal RV size with mildly decreased systolic function, mechanical aortic valve with mean gradient 14 mmHg, ascending aorta 4.4 cm.  - Echo (5/19) with EF 35-40%, diffuse hypokinesis, mild LVH, mildly decreased RV systolic function, mechanical aortic valve looked ok.   - Echo (11/20) with EF up to 50-55%, normal RV size and systolic function, mechanical aortic valve looks ok, normal IVC, ascending aorta 4.2 cm.  - Echo (12/21) with EF 60-65%, mild RV enlargement, normal RV function, mechanical aortic valve mean gradient 10 mmHg, ascending aorta 4.1 cm. - Echo (1/23) with EF 35-40%, global hypokinesis, mildly decreased RV systolic function, normal mechanical aortic valve, moderate Adrian, PASP 51 mmHg.  - L/RHC (1/23): minimal CAD, RA mean 6, PA 35/18 (25) PCWP mean 17, preserved CO/CI 4.87/2.39, PVR 1.6 WU. 3. Atrial fibrillation: First noted in 0/35, uncertain how long it had been present (with RVR).  08/28/2016 Successful DC-CV --> NSR.  Back in atrial fibrillation at 09/18/16 office visit.  Set up for DCCV in 3/18 but back in NSR.   - Atrial fibrillation ablation in 5/18. Now off amiodarone.  4. 09/04/2016 Syncope--> over-diuresis most likely.  5. HTN 6. Ascending aortic aneurysm: 4.4 cm on 7/17 echo, 4.4 cm on 5/18 echo.  - MRA chest (4/19): 4.5 cm ascending aortic aneurysm.  - MRA chest (7/20): 4.5 cm ascending aortic aneurysm.  - MRA chest (1/22): 4.3 cm ascending aortic aneurysm (breast abnormality, recommend mammogram) 7. Prostate cancer: Treated with radioactive seed implantation.  8. Nephrolithiasis.  9. PFTs (4/18): Mild  restriction.  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  Tobacco Use   Smoking status: Never   Smokeless tobacco: Never  Vaping Use   Vaping Use: Never used  Substance and Sexual Activity   Alcohol use: Yes    Alcohol/week: 3.0 standard drinks    Types: 3 Cans of beer per week    Comment:  "may have 2-3 beers on the weekend"   Drug use: No   Sexual activity: Not on file  Other Topics Concern   Not on file  Social History Narrative   Not on file   Social Determinants of Health   Financial Resource Strain: Not on file  Food Insecurity: Not on file  Transportation Needs: Not on file  Physical Activity: Not on file  Stress: Not on file  Social Connections: Not on file  Intimate Partner Violence: Not on file    FH:  Family History  Problem Relation Age of Onset   Heart failure Mother    Diabetes Mother    Prostate cancer Father     Current Outpatient Medications  Medication Sig Dispense Refill   acetaminophen (TYLENOL) 500 MG tablet Take 500 mg by mouth at bedtime.     allopurinol (ZYLOPRIM) 300 MG tablet Take 300 mg by mouth in the morning.     aspirin 81 MG chewable tablet Chew 1 tablet (81 mg total) by mouth daily. 30 tablet 6   colchicine 0.6 MG tablet Take 0.6 mg by mouth daily as needed (for gout).      dapagliflozin propanediol (FARXIGA) 10 MG TABS tablet Take 1 tablet (10 mg total) by mouth daily before breakfast. 30 tablet 6   docusate sodium (COLACE) 100 MG capsule Take 100 mg by mouth 2 (two) times daily.     ENTRESTO 24-26 MG TAKE 1 TABLET BY MOUTH TWICE DAILY 409 tablet 3   folic acid (FOLVITE) 1 MG tablet TAKE 1 TABLET(1 MG) BY MOUTH  DAILY 90 tablet 3   GEMTESA 75 MG TABS Take 75 mg by mouth every other day. In the morning     metoprolol succinate (TOPROL-XL) 25 MG 24 hr tablet Take 1 tablet (25 mg total) by mouth at bedtime. 30 tablet 3   potassium chloride SA (KLOR-CON M) 20 MEQ tablet Take 20 mEq by mouth daily.     Psyllium (METAMUCIL PO) Take 2 capsules by mouth 2 (two) times daily.      tamsulosin (FLOMAX) 0.4 MG CAPS capsule Take 0.4 mg by mouth every other day.      torsemide (DEMADEX) 20 MG tablet Take 1 tablet (20 mg total) by mouth See admin instructions. take 1 tablet (20 mg) by mouth daily 30 tablet 11   warfarin (COUMADIN) 5 MG tablet Take 0.5-1 tablets (2.5-5 mg total) by mouth See admin instructions. 1/19 and 1/20  take 7.5mg  (1 and 1/2 tab) then take 1 tab (5mg ) daily until seen by MD  Take 1 tablet (5 mg) by mouth on Sundays, Mondays, Wednesdays, Thursdays & Saturdays in the evening. Take 0.5 tablet (2.5 mg) by mouth on Tuesdays & Fridays in the evening. 45 tablet 11   No current facility-administered medications for this encounter.   BP 126/76    Pulse 82    Wt 92.9 kg (204 lb 12.8 oz)    SpO2 98%    BMI 29.39 kg/m   Wt Readings from Last 3 Encounters:  08/24/21 92.9 kg (204 lb 12.8 oz)  08/11/21 85.7 kg (189 lb)  08/02/21 94.7 kg (208 lb 12.8 oz)   PHYSICAL EXAM: General:  NAD. No resp difficulty HEENT: Normal Neck: Supple. No JVD. Carotids 2+ bilat; no bruits. No lymphadenopathy or thryomegaly appreciated. Cor: PMI nondisplaced. Regular rate & rhythm. No rubs, gallops or murmurs. + mechanical S2 Lungs: Clear Abdomen: Soft, nontender, nondistended. No hepatosplenomegaly. No bruits. Good bowel sounds. Bruising to peri-umbilical area, 2 small, firm mobile nodules to LLQ, non tender Extremities: No cyanosis, clubbing, rash, edema Neuro: Alert & oriented x 3, cranial nerves grossly intact. Moves all 4 extremities w/o difficulty. Affect pleasant.  ASSESSMENT & PLAN: 1. Chronic systolic CHF: EF on  TEE in 1/18 20-25%, mechanical aortic valve ok. Fall in EF may have been related to tachycardia-mediated CMP with rapid atrial fibrillation (echo 7/17 with EF  45-50%).  He required milrinone to assist diuresis during 1/18-2/18 admission.  Now that he is back in NSR s/p atrial fibrillation ablation, echo in 5/18 showed EF back up to 45%.  Echo in 5/19 showed mildly lower systolic function, EF 23-41%. Echo in 12/21 showed EF up to 60-65%.  However, echo 1/23 showed EF back down to 35-40% with mild RV dysfunction.  He is regular on exam today.  Better NYHA class II- early III symptoms. He is not volume overloaded on exam. - Continue torsemide 20 mg daily. BMET/BNP today. - Continue Entresto 24/26 mg bid.      - Continue Toprol XL 25 mg daily.   - Continue Farxiga 10 mg daily.  2. Mechanical aortic valve: Continue warfarin and ASA 81. Valve looked ok recent echo.  - Recent CBC ok. 3. Atrial fibrillation: Admitted 1/18 with atrial fibrillation with RVR and CHF.  EF down to 20-25%.  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  Need to keep him in NSR. DCCV during 2/18 admission and now s/p atrial fibrillation ablation.  He is regular on exam today - He is now off amiodarone.   - Continue warfarin. No abnormal bleeding.  4. Ascending aortic aneurysm: 4.3 cm on 1/22 MRA chest.   5. Hematoma: "knot" on abdomen in area Lovenox shots given pre-cath. Advised watchful waiting and apply warm compress. If no better in 1-2 weeks, can check abdominal ultrasound. No lower back pain. Of note, he has history of breast hamartomas. - Check CBC today.  Followup with Dr. Aundra Dubin in 3 months.  Echo  08/24/2021

## 2021-08-24 ENCOUNTER — Ambulatory Visit (HOSPITAL_COMMUNITY)
Admission: RE | Admit: 2021-08-24 | Discharge: 2021-08-24 | Disposition: A | Discharge status: Home / Self Care | Payer: Medicare Other | Source: Ambulatory Visit | Attending: Family Medicine | Admitting: Family Medicine

## 2021-08-24 ENCOUNTER — Encounter (HOSPITAL_COMMUNITY): Payer: Self-pay

## 2021-08-24 ENCOUNTER — Other Ambulatory Visit: Payer: Self-pay

## 2021-08-24 VITALS — BP 126/76 | HR 82 | Wt 204.8 lb

## 2021-08-24 DIAGNOSIS — T1490XA Injury, unspecified, initial encounter: Secondary | ICD-10-CM | POA: Diagnosis not present

## 2021-08-24 DIAGNOSIS — Z7901 Long term (current) use of anticoagulants: Secondary | ICD-10-CM | POA: Insufficient documentation

## 2021-08-24 DIAGNOSIS — Z7982 Long term (current) use of aspirin: Secondary | ICD-10-CM | POA: Insufficient documentation

## 2021-08-24 DIAGNOSIS — I5022 Chronic systolic (congestive) heart failure: Secondary | ICD-10-CM | POA: Diagnosis not present

## 2021-08-24 DIAGNOSIS — I4891 Unspecified atrial fibrillation: Secondary | ICD-10-CM | POA: Diagnosis not present

## 2021-08-24 DIAGNOSIS — I272 Pulmonary hypertension, unspecified: Secondary | ICD-10-CM | POA: Diagnosis not present

## 2021-08-24 DIAGNOSIS — Z7984 Long term (current) use of oral hypoglycemic drugs: Secondary | ICD-10-CM | POA: Diagnosis not present

## 2021-08-24 DIAGNOSIS — S3632XA Contusion of stomach, initial encounter: Secondary | ICD-10-CM | POA: Diagnosis not present

## 2021-08-24 DIAGNOSIS — Z952 Presence of prosthetic heart valve: Secondary | ICD-10-CM | POA: Diagnosis not present

## 2021-08-24 DIAGNOSIS — Z79899 Other long term (current) drug therapy: Secondary | ICD-10-CM | POA: Diagnosis not present

## 2021-08-24 DIAGNOSIS — I2510 Atherosclerotic heart disease of native coronary artery without angina pectoris: Secondary | ICD-10-CM | POA: Insufficient documentation

## 2021-08-24 DIAGNOSIS — I480 Paroxysmal atrial fibrillation: Secondary | ICD-10-CM

## 2021-08-24 DIAGNOSIS — E669 Obesity, unspecified: Secondary | ICD-10-CM | POA: Insufficient documentation

## 2021-08-24 DIAGNOSIS — M7981 Nontraumatic hematoma of soft tissue: Secondary | ICD-10-CM | POA: Diagnosis not present

## 2021-08-24 DIAGNOSIS — I5043 Acute on chronic combined systolic (congestive) and diastolic (congestive) heart failure: Secondary | ICD-10-CM | POA: Diagnosis not present

## 2021-08-24 DIAGNOSIS — I7121 Aneurysm of the ascending aorta, without rupture: Secondary | ICD-10-CM | POA: Insufficient documentation

## 2021-08-24 DIAGNOSIS — I10 Essential (primary) hypertension: Secondary | ICD-10-CM | POA: Insufficient documentation

## 2021-08-24 DIAGNOSIS — I714 Abdominal aortic aneurysm, without rupture, unspecified: Secondary | ICD-10-CM

## 2021-08-24 DIAGNOSIS — M7989 Other specified soft tissue disorders: Secondary | ICD-10-CM

## 2021-08-24 LAB — CBC
HCT: 44.6 % (ref 39.0–52.0)
Hemoglobin: 14 g/dL (ref 13.0–17.0)
MCH: 19.4 pg — ABNORMAL LOW (ref 26.0–34.0)
MCHC: 31.4 g/dL (ref 30.0–36.0)
MCV: 61.9 fL — ABNORMAL LOW (ref 80.0–100.0)
Platelets: 184 10*3/uL (ref 150–400)
RBC: 7.2 MIL/uL — ABNORMAL HIGH (ref 4.22–5.81)
RDW: 19.4 % — ABNORMAL HIGH (ref 11.5–15.5)
WBC: 10.6 10*3/uL — ABNORMAL HIGH (ref 4.0–10.5)
nRBC: 0 % (ref 0.0–0.2)

## 2021-08-24 LAB — BASIC METABOLIC PANEL
Anion gap: 8 (ref 5–15)
BUN: 25 mg/dL — ABNORMAL HIGH (ref 8–23)
CO2: 27 mmol/L (ref 22–32)
Calcium: 9.4 mg/dL (ref 8.9–10.3)
Chloride: 102 mmol/L (ref 98–111)
Creatinine, Ser: 1.29 mg/dL — ABNORMAL HIGH (ref 0.61–1.24)
GFR, Estimated: 56 mL/min — ABNORMAL LOW (ref >60)
Glucose, Bld: 118 mg/dL — ABNORMAL HIGH (ref 70–99)
Potassium: 4.8 mmol/L (ref 3.5–5.1)
Sodium: 137 mmol/L (ref 135–145)

## 2021-08-24 LAB — BRAIN NATRIURETIC PEPTIDE: B Natriuretic Peptide: 730.2 pg/mL — ABNORMAL HIGH (ref 0.0–100.0)

## 2021-08-24 NOTE — Patient Instructions (Signed)
No changes to your medications.  Labs done today, your results will be available in MyChart, we will contact you for abnormal readings.  Your physician recommends that you schedule a follow-up appointment in: 3 months with Dr. Aundra Dubin.  If you have any questions or concerns before your next appointment please send [place] a message through [place] or call our office at [phone].    TO LEAVE A MESSAGE FOR THE NURSE SELECT OPTION 2, PLEASE LEAVE A MESSAGE INCLUDING: YOUR NAME DATE OF BIRTH CALL BACK NUMBER REASON FOR CALL**this is important as we prioritize the call backs  YOU WILL RECEIVE A CALL BACK THE SAME DAY AS LONG AS YOU CALL BEFORE 4:00 PM  At the [facility], you and your health needs are our priority. As part of our continuing mission to provide you with exceptional heart care, we have created designated Provider Care Teams. These Care Teams include your primary Cardiologist (physician) and Advanced Practice Providers (APPs- Physician Assistants and Nurse Practitioners) who all work together to provide you with the care you need, when you need it.   You may see any of the following providers on your designated Care Team at your next follow up: Dr Glori Bickers Dr Haynes Kerns, NP Lyda Jester, [place] [facility] [place], [place] Audry Riles, PharmD   Please be sure to bring in all your medications bottles to every appointment.

## 2021-08-25 DIAGNOSIS — Z7901 Long term (current) use of anticoagulants: Secondary | ICD-10-CM | POA: Diagnosis not present

## 2021-09-08 ENCOUNTER — Other Ambulatory Visit (HOSPITAL_COMMUNITY): Payer: Medicare Other

## 2021-09-08 ENCOUNTER — Encounter (HOSPITAL_COMMUNITY): Payer: Medicare Other | Admitting: Cardiology

## 2021-09-22 DIAGNOSIS — Z7901 Long term (current) use of anticoagulants: Secondary | ICD-10-CM | POA: Diagnosis not present

## 2021-10-20 DIAGNOSIS — Z7901 Long term (current) use of anticoagulants: Secondary | ICD-10-CM | POA: Diagnosis not present

## 2021-10-20 DIAGNOSIS — Z1211 Encounter for screening for malignant neoplasm of colon: Secondary | ICD-10-CM | POA: Diagnosis not present

## 2021-11-08 ENCOUNTER — Other Ambulatory Visit (HOSPITAL_COMMUNITY): Payer: Self-pay | Admitting: Cardiology

## 2021-11-21 ENCOUNTER — Ambulatory Visit (HOSPITAL_COMMUNITY)
Admission: RE | Admit: 2021-11-21 | Discharge: 2021-11-21 | Disposition: A | Discharge status: Home / Self Care | Payer: Medicare Other | Source: Ambulatory Visit | Attending: Cardiology | Admitting: Cardiology

## 2021-11-21 ENCOUNTER — Encounter (HOSPITAL_COMMUNITY): Payer: Self-pay | Admitting: Cardiology

## 2021-11-21 VITALS — BP 120/78 | HR 77 | Ht 70.0 in | Wt 204.0 lb

## 2021-11-21 DIAGNOSIS — Z7984 Long term (current) use of oral hypoglycemic drugs: Secondary | ICD-10-CM | POA: Diagnosis not present

## 2021-11-21 DIAGNOSIS — Z7901 Long term (current) use of anticoagulants: Secondary | ICD-10-CM | POA: Insufficient documentation

## 2021-11-21 DIAGNOSIS — I2510 Atherosclerotic heart disease of native coronary artery without angina pectoris: Secondary | ICD-10-CM | POA: Diagnosis not present

## 2021-11-21 DIAGNOSIS — I5043 Acute on chronic combined systolic (congestive) and diastolic (congestive) heart failure: Secondary | ICD-10-CM

## 2021-11-21 DIAGNOSIS — I480 Paroxysmal atrial fibrillation: Secondary | ICD-10-CM

## 2021-11-21 DIAGNOSIS — Z7982 Long term (current) use of aspirin: Secondary | ICD-10-CM | POA: Insufficient documentation

## 2021-11-21 DIAGNOSIS — I5022 Chronic systolic (congestive) heart failure: Secondary | ICD-10-CM | POA: Diagnosis not present

## 2021-11-21 DIAGNOSIS — I4891 Unspecified atrial fibrillation: Secondary | ICD-10-CM | POA: Insufficient documentation

## 2021-11-21 DIAGNOSIS — Z952 Presence of prosthetic heart valve: Secondary | ICD-10-CM | POA: Diagnosis not present

## 2021-11-21 DIAGNOSIS — E7849 Other hyperlipidemia: Secondary | ICD-10-CM

## 2021-11-21 DIAGNOSIS — Z79899 Other long term (current) drug therapy: Secondary | ICD-10-CM | POA: Diagnosis not present

## 2021-11-21 DIAGNOSIS — I7121 Aneurysm of the ascending aorta, without rupture: Secondary | ICD-10-CM | POA: Diagnosis not present

## 2021-11-21 DIAGNOSIS — I110 Hypertensive heart disease with heart failure: Secondary | ICD-10-CM | POA: Diagnosis not present

## 2021-11-21 LAB — CBC
HCT: 49.9 % (ref 39.0–52.0)
Hemoglobin: 15.7 g/dL (ref 13.0–17.0)
MCH: 19.8 pg — ABNORMAL LOW (ref 26.0–34.0)
MCHC: 31.5 g/dL (ref 30.0–36.0)
MCV: 63.1 fL — ABNORMAL LOW (ref 80.0–100.0)
Platelets: 181 10*3/uL (ref 150–400)
RBC: 7.91 MIL/uL — ABNORMAL HIGH (ref 4.22–5.81)
RDW: 20 % — ABNORMAL HIGH (ref 11.5–15.5)
WBC: 9.8 10*3/uL (ref 4.0–10.5)
nRBC: 0 % (ref 0.0–0.2)

## 2021-11-21 LAB — BRAIN NATRIURETIC PEPTIDE: B Natriuretic Peptide: 678.1 pg/mL — ABNORMAL HIGH (ref 0.0–100.0)

## 2021-11-21 LAB — BASIC METABOLIC PANEL
Anion gap: 7 (ref 5–15)
BUN: 22 mg/dL (ref 8–23)
CO2: 27 mmol/L (ref 22–32)
Calcium: 9.4 mg/dL (ref 8.9–10.3)
Chloride: 103 mmol/L (ref 98–111)
Creatinine, Ser: 1.3 mg/dL — ABNORMAL HIGH (ref 0.61–1.24)
GFR, Estimated: 56 mL/min — ABNORMAL LOW (ref >60)
Glucose, Bld: 115 mg/dL — ABNORMAL HIGH (ref 70–99)
Potassium: 4.8 mmol/L (ref 3.5–5.1)
Sodium: 137 mmol/L (ref 135–145)

## 2021-11-21 LAB — LIPID PANEL
Cholesterol: 147 mg/dL (ref 0–200)
HDL: 36 mg/dL — ABNORMAL LOW (ref >40)
LDL Cholesterol: 92 mg/dL (ref 0–99)
Total CHOL/HDL Ratio: 4.1 RATIO
Triglycerides: 96 mg/dL (ref <150)
VLDL: 19 mg/dL (ref 0–40)

## 2021-11-21 MED ORDER — TORSEMIDE 20 MG PO TABS: 10.0000 mg | ORAL_TABLET | ORAL | 11 refills | Status: DC

## 2021-11-21 MED ORDER — SPIRONOLACTONE 25 MG PO TABS: 12.5000 mg | ORAL_TABLET | Freq: Every day | ORAL | 2 refills | Status: DC

## 2021-11-21 NOTE — Patient Instructions (Signed)
Medication Changes: ? ?Stop potassium ? ?Start Spiornolactone 12.'5mg'$  (1/2 tab) daily  ? ?Decrease Torsemide to 10 mg (1/2 tab) daily ? ?Lab Work: ? ?Labs done today, your results will be available in MyChart, we will contact you for abnormal readings. ? ?Testing/Procedures: ? ?Your provider has ordered a MRA SCAN. ONCE APPROVED BY YOUR INSURANCE YOU WILL BE CALLED TO ARRANGE THE APPOINTMENT ? ?Referrals: ? ?none ? ?Special Instructions // Education: ? ?none ? ?Follow-Up in: 6 week  ? ?At the [facility], you and your health needs are our priority. We have a designated team specialized in the treatment of Heart Failure. This Care Team includes your primary Heart Failure Specialized Cardiologist (physician), Advanced Practice Providers (APPs- Physician Assistants and Nurse Practitioners), and Pharmacist who all work together to provide you with the care you need, when you need it.  ? ?You may see any of the following providers on your designated Care Team at your next follow up: ? ?Dr Glori Bickers ?Dr [name] ?Darrick Grinder, NP ?Lyda Jester, PA ?Jessica Milford,NP ?Marlyce Huge, PA ?Audry Riles, PharmD ? ? ?Please be sure to bring in all your medications bottles to every appointment.  ? ?Need to Contact us: ? ?If you have any questions or concerns before your next appointment please send [place] a message through [place] or call our office at [phone].   ? ?TO LEAVE A MESSAGE FOR THE NURSE SELECT OPTION 2, PLEASE LEAVE A MESSAGE INCLUDING: ?YOUR NAME ?DATE OF BIRTH ?CALL BACK NUMBER ?REASON FOR CALL**this is important as we prioritize the call backs ? ?YOU WILL RECEIVE A CALL BACK THE SAME DAY AS LONG AS YOU CALL BEFORE 4:00 PM ? ? ?

## 2021-11-21 NOTE — Progress Notes (Signed)
PCP: Dr Rex Kras - Also manages INR ?Cardiology: Dr. Martinique  ?HF Cardiology: Dr Aundra Dubin  ? ?HPI: ?[name] is a 81 y.o. male with a history of severe AS s/p St Jude mechanical AVR in 03/2003 on chronic coumadin, HTN, obesity, and mild LV dysfxn  EF 40-45% on 01/2016 echo.  Prior to admit in 1/18, he had a cough and dyspnea that was thought to be bronchitis so he was treated with abx and oral steroids. He was seen in cardiology office in 1/18 and was noted to be in rapid atrial fibrillation with significant volume overload.  ? ?Admitted 1/23 through 08/29/16 with marked volume overload, lower extremity edema, and atrial fibrillation with RVR.  Initial cardioversion failed and he was difficult to diurese. He was placed on milrinone for low output and diuresed with IV lasix with good response.  Weight came down considerably. Amiodarone was begun. Once fully diuresed, he had successful DC-CV on 08/28/16. Discharged on torsemide 60 mg daily. Discharge weight 212 pounds. Echo and TEE in 1/18 had shown fall in EF to around 25%.  ? ?He continued to lose weight as an outpatient.  He had a syncopal episode where he stood up from sitting and got lightheaded with transient loss of consciousness.  He had been having orthostatic symptoms.  We saw him in the office and cut back on torsemide from 60 mg daily down eventually to 20 mg daily.  Creatinine up some to 1.7 at that time.   ? ?At a prior appt, he was back in atrial fibrillation.  He was set up for DCCV in 3/18, but was in NSR when he arrived to short stay. He had atrial fibrillation ablation with Dr. Curt Bears in 5/18.  ? ?Echo was done in 5/18, EF up to 45% with mild diffuse hypokinesis, normal mechanical aortic valve, mildly decreased RV systolic function.   ? ?Echo was repeated in 5/19, EF 35-40%, diffuse hypokinesis, mildly decreased RV systolic function, stable mechanical aortic valve.  ? ?Echo in 11/21 showed EF up to 50-55%, normal RV size and systolic function, mechanical  aortic valve looks ok, normal IVC, ascending aorta 4.2 cm. Echo in 12/21 showed EF 60-65%, mild RV enlargement, normal RV function, mechanical aortic valve mean gradient 10 mmHg, ascending aorta 4.1 cm.  MRA chest in 1/22 showed 4.3 cm ascending aorta.  ? ?Echo 1/23 showed EF 35-40%, global hypokinesis, mildly decreased RV systolic function, normal mechanical aortic valve, moderate MR, PASP 51 mmHg. With EF back down and mild volume overload, he was started on Farxiga, torsemide increased and R/LHC arranged. ? ?R/LHC (1/23) showed mild elevated PCWP with mild pulmonary venous hypertension, preserved CO and minimal CAD. ? ?Patient returns for followup of CHF.  Weight is stable.  He is breathing a lot better.  He walks his dog daily for 1 mile without dyspnea. No chest pain.  No orthopnea/PND.  No lightheadedness.  He feels like he has to urinate too much with torsemide.  ? ?ECG (personally reviewed): NSR, LAFB, RBBB, 1st degree AVB, anterior Qs ? ?Labs (09/04/2016):  Hgb 15.9, K 5.0, Creatinine 1.77, digoxin 0.7, BNP 326  ?Labs (3/18): digoxin 0.8, K 4.8, creatinine 1.44, TSH normal, AST 54, ALT 49 ?Labs (4/18): K 4.3, creatinine 1.3 ?Labs (7/18): K 3.8, creatinine 0.9, hgb 11.9 ?Labs (11/18): K 4.9, creatinine 1.0, hgb 13.5 ?Labs (3/19): K 4.7,creatinine 0.99 ?Labs (5/19): K 4.6, creatinine 0.98 ?Labs (9/19): K 4.2, creatinine 0.91 ?Labs (12/19): K 4.6, creatinine 1.14 ?Labs (3/20): K 4.6,  creatinine 0.98 ?Labs (7/20): creatinine 1.07 ?Labs (9/21): K 5, creatinine 1.13 ?Labs (12/21): LDL 77, K 4.1, creatinine 0.97 ?Labs (6/22): K 4.8, creatinine 1.02 ?Labs (10/22): K 4.9, creatinine 1.02 ?Labs (11/22): K 4.9, creatinine 1.02 ?Labs (1/23): K 4.5, creatinine 1.18 ?Labs (2/23): K 4.8, creatinine 1.29 ? ?PMH: ?1. Aortic stenosis: # 25 St Jude mechanical aortic valve in 9/14.   ?2. Chronic systolic CHF: Echo in 4/12 with EF 45-50%.  Admitted in 1/18 with afib with RVR, ?tachycardia-mediated cardiomyopathy.  ?- Echo (1/18)  with EF 25-30%, stable mechanical aortic valve.  ?- TEE (1/18) with EF 20-25%, stable mechanical aortic valve, moderate MR, small PFO.  ?- Admission 1/18 with acute on chronic systolic CHF, required milrinone, extensively diuresed.  ?- Echo (5/18) with EF 45%, diffuse hypokinesis, normal RV size with mildly decreased systolic function, mechanical aortic valve with mean gradient 14 mmHg, ascending aorta 4.4 cm.  ?- Echo (5/19) with EF 35-40%, diffuse hypokinesis, mild LVH, mildly decreased RV systolic function, mechanical aortic valve looked ok.   ?- Echo (11/20) with EF up to 50-55%, normal RV size and systolic function, mechanical aortic valve looks ok, normal IVC, ascending aorta 4.2 cm.  ?- Echo (12/21) with EF 60-65%, mild RV enlargement, normal RV function, mechanical aortic valve mean gradient 10 mmHg, ascending aorta 4.1 cm. ?- Echo (1/23) with EF 35-40%, global hypokinesis, mildly decreased RV systolic function, normal mechanical aortic valve, moderate MR, PASP 51 mmHg.  ?- L/RHC (1/23): minimal CAD, RA mean 6, PA 35/18 (25) PCWP mean 17, preserved CO/CI 4.87/2.39, PVR 1.6 WU. ?3. Atrial fibrillation: First noted in 8/78, uncertain how long it had been present (with RVR).  08/28/2016 Successful DC-CV --> NSR.  Back in atrial fibrillation at 09/18/16 office visit.  Set up for DCCV in 3/18 but back in NSR.   ?- Atrial fibrillation ablation in 5/18. Now off amiodarone.  ?4. 09/04/2016 Syncope--> over-diuresis most likely.  ?5. HTN ?6. Ascending aortic aneurysm: 4.4 cm on 7/17 echo, 4.4 cm on 5/18 echo.  ?- MRA chest (4/19): 4.5 cm ascending aortic aneurysm.  ?- MRA chest (7/20): 4.5 cm ascending aortic aneurysm.  ?- MRA chest (1/22): 4.3 cm ascending aortic aneurysm (breast abnormality, recommend mammogram) ?7. Prostate cancer: Treated with radioactive seed implantation.  ?8. Nephrolithiasis.  ?9. PFTs (4/18): Mild restriction.  ?10. Breast hamartoma ? ?ROS: All systems negative except as listed in HPI, PMH and  Problem List. ? ?SH:  ?Social History  ? ?Socioeconomic History  ? Marital status: Married  ?  Spouse name: Not on file  ? Number of children: 4  ? Years of education: Not on file  ? Highest education level: Not on file  ?Occupational History  ? 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 Smoking status: Never  ? Smokeless tobacco: Never  ?Vaping Use  ? Vaping Use: Never used  ?Substance and Sexual Activity  ? Alcohol use: Yes  ?  Alcohol/week: 3.0 standard drinks  ?  Types: 3 Cans of beer per week  ?  Comment:  "may have 2-3 beers on the weekend"  ? Drug use: No  ? Sexual activity: Not on file  ?Other Topics Concern  ? Not on file  ?Social History Narrative  ? Not on file  ? ?Social Determinants of Health  ? ?Financial Resource Strain: Not on file  ?Food Insecurity: Not on file  ?Transportation Needs: Not on file  ?Physical Activity: Not on file  ?Stress: Not  on file  ?Social Connections: Not on file  ?Intimate Partner Violence: Not on file  ? ? ?FH:  ?Family History  ?Problem Relation Age of Onset  ? Heart failure Mother   ? Diabetes Mother   ? Prostate cancer Father   ? ? ?Current Outpatient Medications  ?Medication Sig Dispense Refill  ? acetaminophen (TYLENOL) 500 MG tablet Take 500 mg by mouth at bedtime.    ? allopurinol (ZYLOPRIM) 300 MG tablet Take 300 mg by mouth in the morning.    ? aspirin 81 MG chewable tablet Chew 1 tablet (81 mg total) by mouth daily. 30 tablet 6  ? colchicine 0.6 MG tablet Take 0.6 mg by mouth daily as needed (for gout).     ? dapagliflozin propanediol (FARXIGA) 10 MG TABS tablet Take 1 tablet (10 mg total) by mouth daily before breakfast. 30 tablet 6  ? docusate sodium (COLACE) 100 MG capsule Take 100 mg by mouth 2 (two) times daily.    ? ENTRESTO 24-26 MG TAKE 1 TABLET BY MOUTH TWICE DAILY 180 tablet 3  ? folic acid (FOLVITE) 1 MG tablet TAKE 1 TABLET(1 MG) BY MOUTH DAILY 90 tablet 3  ? GEMTESA 75 MG TABS Take 75 mg by mouth every other day. In the morning    ?  metoprolol succinate (TOPROL-XL) 25 MG 24 hr tablet Take 1 tablet (25 mg total) by mouth at bedtime. 30 tablet 3  ? Psyllium (METAMUCIL PO) Take 2 capsules by mouth 2 (two) times daily.     ? spironolacton

## 2021-11-23 ENCOUNTER — Telehealth (HOSPITAL_COMMUNITY): Payer: Self-pay | Admitting: Surgery

## 2021-11-23 MED ORDER — TORSEMIDE 20 MG PO TABS: 10.0000 mg | ORAL_TABLET | ORAL | 11 refills | Status: DC

## 2021-11-23 NOTE — Telephone Encounter (Signed)
-----   Message from Larey Dresser, MD sent at 11/21/2021 10:12 PM EDT ----- ?BNP still elevated.  I let him cut back torsemide to 10 mg daily today. I would have him take 20 mg daily alternating with 10 mg daily.  ?

## 2021-11-23 NOTE — Telephone Encounter (Signed)
I contacted patient to review results and recommendations per provider.  Patient is aware of medication change and is agreeable. Medication list updated in [facility]. ?

## 2021-11-24 DIAGNOSIS — Z7901 Long term (current) use of anticoagulants: Secondary | ICD-10-CM | POA: Diagnosis not present

## 2021-12-06 DIAGNOSIS — Z85828 Personal history of other malignant neoplasm of skin: Secondary | ICD-10-CM | POA: Diagnosis not present

## 2021-12-06 DIAGNOSIS — L821 Other seborrheic keratosis: Secondary | ICD-10-CM | POA: Diagnosis not present

## 2021-12-06 DIAGNOSIS — Z85820 Personal history of malignant melanoma of skin: Secondary | ICD-10-CM | POA: Diagnosis not present

## 2021-12-06 DIAGNOSIS — L814 Other melanin hyperpigmentation: Secondary | ICD-10-CM | POA: Diagnosis not present

## 2021-12-15 ENCOUNTER — Ambulatory Visit (HOSPITAL_COMMUNITY)
Admission: RE | Admit: 2021-12-15 | Discharge: 2021-12-15 | Disposition: A | Discharge status: Home / Self Care | Payer: Medicare Other | Source: Ambulatory Visit | Attending: Cardiology | Admitting: Cardiology

## 2021-12-15 DIAGNOSIS — Z954 Presence of other heart-valve replacement: Secondary | ICD-10-CM | POA: Insufficient documentation

## 2021-12-15 DIAGNOSIS — Z952 Presence of prosthetic heart valve: Secondary | ICD-10-CM | POA: Diagnosis not present

## 2021-12-15 DIAGNOSIS — I712 Thoracic aortic aneurysm, without rupture, unspecified: Secondary | ICD-10-CM | POA: Diagnosis not present

## 2021-12-15 DIAGNOSIS — I719 Aortic aneurysm of unspecified site, without rupture: Secondary | ICD-10-CM | POA: Insufficient documentation

## 2021-12-15 DIAGNOSIS — I5043 Acute on chronic combined systolic (congestive) and diastolic (congestive) heart failure: Secondary | ICD-10-CM | POA: Diagnosis not present

## 2021-12-15 DIAGNOSIS — Z09 Encounter for follow-up examination after completed treatment for conditions other than malignant neoplasm: Secondary | ICD-10-CM | POA: Diagnosis not present

## 2021-12-15 MED ORDER — GADOBUTROL 1 MMOL/ML IV SOLN
9.5000 mL | Freq: Once | INTRAVENOUS | Status: AC | PRN
  Administered 2021-12-15: 9.5 mL via INTRAVENOUS

## 2022-01-02 ENCOUNTER — Encounter (HOSPITAL_COMMUNITY): Payer: Self-pay

## 2022-01-02 ENCOUNTER — Ambulatory Visit (HOSPITAL_COMMUNITY)
Admission: RE | Admit: 2022-01-02 | Discharge: 2022-01-02 | Disposition: A | Discharge status: Home / Self Care | Payer: Medicare Other | Source: Ambulatory Visit | Attending: Family Medicine | Admitting: Family Medicine

## 2022-01-02 VITALS — BP 90/64 | HR 76 | Wt 202.6 lb

## 2022-01-02 DIAGNOSIS — I7121 Aneurysm of the ascending aorta, without rupture: Secondary | ICD-10-CM | POA: Diagnosis not present

## 2022-01-02 DIAGNOSIS — I714 Abdominal aortic aneurysm, without rupture, unspecified: Secondary | ICD-10-CM

## 2022-01-02 DIAGNOSIS — Z7901 Long term (current) use of anticoagulants: Secondary | ICD-10-CM | POA: Diagnosis not present

## 2022-01-02 DIAGNOSIS — Z952 Presence of prosthetic heart valve: Secondary | ICD-10-CM | POA: Diagnosis not present

## 2022-01-02 DIAGNOSIS — I110 Hypertensive heart disease with heart failure: Secondary | ICD-10-CM | POA: Diagnosis not present

## 2022-01-02 DIAGNOSIS — I4891 Unspecified atrial fibrillation: Secondary | ICD-10-CM | POA: Insufficient documentation

## 2022-01-02 DIAGNOSIS — Z79899 Other long term (current) drug therapy: Secondary | ICD-10-CM | POA: Diagnosis not present

## 2022-01-02 DIAGNOSIS — I5022 Chronic systolic (congestive) heart failure: Secondary | ICD-10-CM | POA: Insufficient documentation

## 2022-01-02 DIAGNOSIS — I480 Paroxysmal atrial fibrillation: Secondary | ICD-10-CM

## 2022-01-02 LAB — BASIC METABOLIC PANEL
Anion gap: 6 (ref 5–15)
BUN: 27 mg/dL — ABNORMAL HIGH (ref 8–23)
CO2: 25 mmol/L (ref 22–32)
Calcium: 9.1 mg/dL (ref 8.9–10.3)
Chloride: 106 mmol/L (ref 98–111)
Creatinine, Ser: 1.16 mg/dL (ref 0.61–1.24)
GFR, Estimated: >60 mL/min (ref >60)
Glucose, Bld: 113 mg/dL — ABNORMAL HIGH (ref 70–99)
Potassium: 4.7 mmol/L (ref 3.5–5.1)
Sodium: 137 mmol/L (ref 135–145)

## 2022-01-02 LAB — BRAIN NATRIURETIC PEPTIDE: B Natriuretic Peptide: 520.6 pg/mL — ABNORMAL HIGH (ref 0.0–100.0)

## 2022-01-02 NOTE — Progress Notes (Signed)
PCP: Dr Rex Kras - Also manages INR Cardiology: Dr. Martinique  HF Cardiology: Dr Aundra Dubin   HPI: [name] is a 81 y.o. male with a history of severe AS s/p St Jude mechanical AVR in 03/2003 on chronic coumadin, HTN, obesity, and mild LV dysfxn  EF 40-45% on 01/2016 echo.  Prior to admit in 1/18, he had a cough and dyspnea that was thought to be bronchitis so he was treated with abx and oral steroids. He was seen in cardiology office in 1/18 and was noted to be in rapid atrial fibrillation with significant volume overload.   Admitted 1/23 through 08/29/16 with marked volume overload, lower extremity edema, and atrial fibrillation with RVR.  Initial cardioversion failed and he was difficult to diurese. He was placed on milrinone for low output and diuresed with IV lasix with good response.  Weight came down considerably. Amiodarone was begun. Once fully diuresed, he had successful DC-CV on 08/28/16. Discharged on torsemide 60 mg daily. Discharge weight 212 pounds. Echo and TEE in 1/18 had shown fall in EF to around 25%.   He continued to lose weight as an outpatient.  He had a syncopal episode where he stood up from sitting and got lightheaded with transient loss of consciousness.  He had been having orthostatic symptoms.  We saw him in the office and cut back on torsemide from 60 mg daily down eventually to 20 mg daily.  Creatinine up some to 1.7 at that time.    At a prior appt, he was back in atrial fibrillation.  He was set up for DCCV in 3/18, but was in NSR when he arrived to short stay. He had atrial fibrillation ablation with Dr. Curt Bears in 5/18.   Echo was done in 5/18, EF up to 45% with mild diffuse hypokinesis, normal mechanical aortic valve, mildly decreased RV systolic function.    Echo was repeated in 5/19, EF 35-40%, diffuse hypokinesis, mildly decreased RV systolic function, stable mechanical aortic valve.   Echo in 11/21 showed EF up to 50-55%, normal RV size and systolic function, mechanical  aortic valve looks ok, normal IVC, ascending aorta 4.2 cm. Echo in 12/21 showed EF 60-65%, mild RV enlargement, normal RV function, mechanical aortic valve mean gradient 10 mmHg, ascending aorta 4.1 cm.  MRA chest in 1/22 showed 4.3 cm ascending aorta.   Echo 1/23 showed EF 35-40%, global hypokinesis, mildly decreased RV systolic function, normal mechanical aortic valve, moderate MR, PASP 51 mmHg. With EF back down and mild volume overload, he was started on Farxiga, torsemide increased and R/LHC arranged.  R/LHC (1/23) showed mild elevated PCWP with mild pulmonary venous hypertension, preserved CO and minimal CAD.  Follow up 5/23, stable volume and NYHA II. Toprol increased to 50 mg daily, spiro 12.5 mg started and torsemide reduced to 10 mg daily.  Today he returns for HF follow up. Overall feeling fine. Occasional ankle swelling. BP at home 85-277'O systolically. He has dyspnea walking up stairs fast but otherwise no breathing difficulties. Denies palpitations, CP, dizziness, abnormal bleeding or PND/Orthopnea. Appetite ok. No fever or chills. Weight at home 198 pounds. Taking all medications. INR monitored by PCP.  ECG (personally reviewed): none ordered today.  Labs (09/04/2016):  Hgb 15.9, K 5.0, Creatinine 1.77, digoxin 0.7, BNP 326  Labs (3/18): digoxin 0.8, K 4.8, creatinine 1.44, TSH normal, AST 54, ALT 49 Labs (4/18): K 4.3, creatinine 1.3 Labs (7/18): K 3.8, creatinine 0.9, hgb 11.9 Labs (11/18): K 4.9, creatinine 1.0, hgb 13.5  Labs (3/19): K 4.7,creatinine 0.99 Labs (5/19): K 4.6, creatinine 0.98 Labs (9/19): K 4.2, creatinine 0.91 Labs (12/19): K 4.6, creatinine 1.14 Labs (3/20): K 4.6, creatinine 0.98 Labs (7/20): creatinine 1.07 Labs (9/21): K 5, creatinine 1.13 Labs (12/21): LDL 77, K 4.1, creatinine 0.97 Labs (6/22): K 4.8, creatinine 1.02 Labs (10/22): K 4.9, creatinine 1.02 Labs (11/22): K 4.9, creatinine 1.02 Labs (1/23): K 4.5, creatinine 1.18 Labs (2/23): K 4.8,  creatinine 1.29  PMH: 1. Aortic stenosis: # 25 St Jude mechanical aortic valve in 9/14.   2. Chronic systolic CHF: Echo in 2/40 with EF 45-50%.  Admitted in 1/18 with afib with RVR, ?tachycardia-mediated cardiomyopathy.  - Echo (1/18) with EF 25-30%, stable mechanical aortic valve.  - TEE (1/18) with EF 20-25%, stable mechanical aortic valve, moderate MR, small PFO.  - Admission 1/18 with acute on chronic systolic CHF, required milrinone, extensively diuresed.  - Echo (5/18) with EF 45%, diffuse hypokinesis, normal RV size with mildly decreased systolic function, mechanical aortic valve with mean gradient 14 mmHg, ascending aorta 4.4 cm.  - Echo (5/19) with EF 35-40%, diffuse hypokinesis, mild LVH, mildly decreased RV systolic function, mechanical aortic valve looked ok.   - Echo (11/20) with EF up to 50-55%, normal RV size and systolic function, mechanical aortic valve looks ok, normal IVC, ascending aorta 4.2 cm.  - Echo (12/21) with EF 60-65%, mild RV enlargement, normal RV function, mechanical aortic valve mean gradient 10 mmHg, ascending aorta 4.1 cm. - Echo (1/23) with EF 35-40%, global hypokinesis, mildly decreased RV systolic function, normal mechanical aortic valve, moderate MR, PASP 51 mmHg.  - L/RHC (1/23): minimal CAD, RA mean 6, PA 35/18 (25) PCWP mean 17, preserved CO/CI 4.87/2.39, PVR 1.6 WU. 3. Atrial fibrillation: First noted in 9/73, uncertain how long it had been present (with RVR).  08/28/2016 Successful DC-CV --> NSR.  Back in atrial fibrillation at 09/18/16 office visit.  Set up for DCCV in 3/18 but back in NSR.   - Atrial fibrillation ablation in 5/18. Now off amiodarone.  4. 09/04/2016 Syncope--> over-diuresis most likely.  5. HTN 6. Ascending aortic aneurysm: 4.4 cm on 7/17 echo, 4.4 cm on 5/18 echo.  - MRA chest (4/19): 4.5 cm ascending aortic aneurysm.  - MRA chest (7/20): 4.5 cm ascending aortic aneurysm.  - MRA chest (1/22): 4.3 cm ascending aortic aneurysm (breast  abnormality, recommend mammogram) 7. Prostate cancer: Treated with radioactive seed implantation.  8. Nephrolithiasis.  9. PFTs (4/18): Mild restriction.  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  Tobacco Use   Smoking status: Never   Smokeless tobacco: Never  Vaping Use   Vaping Use: Never used  Substance and Sexual Activity   Alcohol use: Yes    Alcohol/week: 3.0 standard drinks of alcohol    Types: 3 Cans of beer per week    Comment:  "may have 2-3 beers on the weekend"   Drug use: No   Sexual activity: Not on file  Other Topics Concern   Not on file  Social History Narrative   Not on file   Social Determinants of Health  Financial Resource Strain: Not on file  Food Insecurity: Not on file  Transportation Needs: Not on file  Physical Activity: Not on file  Stress: Not on file  Social Connections: Not on file  Intimate Partner Violence: Not on file    FH:  Family History  Problem Relation Age of Onset   Heart failure Mother    Diabetes Mother    Prostate cancer Father     Current Outpatient Medications  Medication Sig Dispense Refill   acetaminophen (TYLENOL) 500 MG tablet Take 500 mg by mouth at bedtime.     allopurinol (ZYLOPRIM) 300 MG tablet Take 300 mg by mouth in the morning.     aspirin 81 MG chewable tablet Chew 1 tablet (81 mg total) by mouth daily. 30 tablet 6   colchicine 0.6 MG tablet Take 0.6 mg by mouth daily as needed (for gout).      dapagliflozin propanediol (FARXIGA) 10 MG TABS tablet Take 1 tablet (10 mg total) by mouth daily before breakfast. 30 tablet 6   docusate sodium (COLACE) 100 MG capsule Take 100 mg by mouth 2  (two) times daily.     ENTRESTO 24-26 MG TAKE 1 TABLET BY MOUTH TWICE DAILY 841 tablet 3   folic acid (FOLVITE) 1 MG tablet TAKE 1 TABLET(1 MG) BY MOUTH DAILY 90 tablet 3   GEMTESA 75 MG TABS Take 75 mg by mouth every other day. In the morning     metoprolol succinate (TOPROL-XL) 25 MG 24 hr tablet Take 1 tablet (25 mg total) by mouth at bedtime. 30 tablet 3   Psyllium (METAMUCIL PO) Take 2 capsules by mouth 2 (two) times daily.      spironolactone (ALDACTONE) 25 MG tablet Take 0.5 tablets (12.5 mg total) by mouth daily. 60 tablet 2   tamsulosin (FLOMAX) 0.4 MG CAPS capsule Take 0.4 mg by mouth every other day.      torsemide (DEMADEX) 20 MG tablet Take 0.5 tablets (10 mg total) by mouth See admin instructions. Take 10 mg alternating every other day with 20 mg. 30 tablet 11   warfarin (COUMADIN) 5 MG tablet Take 0.5-1 tablets (2.5-5 mg total) by mouth See admin instructions. 1/19 and 1/20  take 7.'5mg'$  (1 and 1/2 tab) then take 1 tab ('5mg'$ ) daily until seen by MD  Take 1 tablet (5 mg) by mouth on Sundays, Mondays, Wednesdays, Thursdays & Saturdays in the evening. Take 0.5 tablet (2.5 mg) by mouth on Tuesdays & Fridays in the evening. 45 tablet 11   No current facility-administered medications for this encounter.   BP 90/64   Pulse 76   Wt 91.9 kg (202 lb 9.6 oz)   SpO2 98%   BMI 29.07 kg/m   Wt Readings from Last 3 Encounters:  01/02/22 91.9 kg (202 lb 9.6 oz)  11/21/21 92.5 kg (204 lb)  08/24/21 92.9 kg (204 lb 12.8 oz)   PHYSICAL EXAM: General:  NAD. No resp difficulty HEENT: Normal Neck: Supple. No JVD. Carotids 2+ bilat; no bruits. No lymphadenopathy or thryomegaly appreciated. Cor: PMI nondisplaced. Regular rate & rhythm. No rubs, gallops or murmurs, +mechanical S2 Lungs: Clear Abdomen: Soft, nontender, nondistended. No hepatosplenomegaly. No bruits or masses. Good bowel sounds. Extremities: No cyanosis, clubbing, rash, edema Neuro: Alert & oriented x 3, cranial nerves grossly  intact. Moves all 4 extremities w/o difficulty. Affect pleasant.  ASSESSMENT & PLAN: 1. Chronic systolic CHF: EF on TEE in 1/18 20-25%, mechanical aortic valve ok. Fall in EF may have been  related to tachycardia-mediated CMP with rapid atrial fibrillation (echo 7/17 with EF 45-50%).  He required milrinone to assist diuresis during 1/18-2/18 admission.  Now that he is back in NSR s/p atrial fibrillation ablation, echo in 5/18 showed EF back up to 45%.  Echo in 5/19 showed mildly lower systolic function, EF 28-31%. Echo in 12/21 showed EF up to 60-65%.  However, echo 1/23 showed EF back down to 35-40% with mild RV dysfunction.  Coronary angiography in 1/23 showed nonobstructive mild CAD.  Now NYHA class II, not volume overloaded on exam.  - Continue torsemide 10 mg daily alternating with 20 mg every other day.  BMET/BNP today.  - Continue Entresto 24/26 mg bid.      - Continue Toprol XL 25 mg daily.   - Continue Farxiga 10 mg daily.  - Continue spironolactone 12.5 mg daily.  2. Mechanical aortic valve: Continue warfarin and ASA 81. Valve looked ok last echo.  - Recent CBC stable. 3. Atrial fibrillation: Admitted 1/18 with atrial fibrillation with RVR and CHF.  EF down to 20-25%.  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  Need to keep him in NSR. DCCV during 2/18 admission and now s/p atrial fibrillation ablation.  He is regular on exam today. - He is now off amiodarone.   - Continue warfarin. No abnormal bleeding.  4. Ascending aortic aneurysm: 4.3 cm on 1/22 MRA chest.  Stable 4.3 cm on 5/23 MRA chest.  Follow up in 3 months with Dr. Wynema Birch [facility] FNP-BC 01/02/2022

## 2022-01-02 NOTE — Patient Instructions (Addendum)
Thank you for coming in today  Labs were done today, if any labs are abnormal the clinic will call you No news is good news  Your physician recommends that you schedule a follow-up appointment in:  3 months with Dr. Mclean    Do the following things EVERYDAY: Weigh yourself in the morning before breakfast. Write it down and keep it in a log. Take your medicines as prescribed Eat low salt foods--Limit salt (sodium) to 2000 mg per day.  Stay as active as you can everyday Limit all fluids for the day to less than 2 liters  At the [facility], you and your health needs are our priority. As part of our continuing mission to provide you with exceptional heart care, we have created designated Provider Care Teams. These Care Teams include your primary Cardiologist (physician) and Advanced Practice Providers (APPs- Physician Assistants and Nurse Practitioners) who all work together to provide you with the care you need, when you need it.   You may see any of the following providers on your designated Care Team at your next follow up: Dr Daniel Bensimhon Dr Dalton McLean Amy Clegg, NP Brittainy Simmons, PA Jessica Milford,NP Lindsay Finch, PA Lauren Kemp, PharmD   Please be sure to bring in all your medications bottles to every appointment.   If you have any questions or concerns before your next appointment please send us a message through mychart or call our office at [phone].    TO LEAVE A MESSAGE FOR THE NURSE SELECT OPTION 2, PLEASE LEAVE A MESSAGE INCLUDING: YOUR NAME DATE OF BIRTH CALL BACK NUMBER REASON FOR CALL**this is important as we prioritize the call backs  YOU WILL RECEIVE A CALL BACK THE SAME DAY AS LONG AS YOU CALL BEFORE 4:00 PM  

## 2022-01-21 ENCOUNTER — Other Ambulatory Visit (HOSPITAL_COMMUNITY): Payer: Self-pay | Admitting: Cardiology

## 2022-01-26 DIAGNOSIS — Z7901 Long term (current) use of anticoagulants: Secondary | ICD-10-CM | POA: Diagnosis not present

## 2022-01-26 DIAGNOSIS — M542 Cervicalgia: Secondary | ICD-10-CM | POA: Diagnosis not present

## 2022-01-27 DIAGNOSIS — M1990 Unspecified osteoarthritis, unspecified site: Secondary | ICD-10-CM | POA: Diagnosis not present

## 2022-01-27 DIAGNOSIS — M542 Cervicalgia: Secondary | ICD-10-CM | POA: Diagnosis not present

## 2022-02-06 ENCOUNTER — Telehealth: Payer: Self-pay | Admitting: *Deleted

## 2022-02-06 NOTE — Telephone Encounter (Signed)
   Pre-operative Risk Assessment    Patient Name: Adrian Lambert  DOB: June 23, 1941 MRN: [id number]      Request for Surgical Clearance    Procedure:   COLONOSCOPY ; SCREENING  Date of Surgery:  Clearance 02/13/22                                 Surgeon:  DR. MARC MAGOD Surgeon's Group or Practice Name:  [facility] Phone number:  [phone] Fax number:  [phone]   Type of Clearance Requested:   - Medical  - Pharmacy:  Hold Aspirin and Warfarin (Coumadin)     Type of Anesthesia:   PROPOFOL   Additional requests/questions:    Jiles Prows   02/06/2022, 3:27 PM

## 2022-02-07 ENCOUNTER — Other Ambulatory Visit: Payer: Self-pay | Admitting: Gastroenterology

## 2022-02-08 NOTE — Telephone Encounter (Signed)
[place] for colonoscopy.  Needs bridging off warfarin.

## 2022-02-08 NOTE — Telephone Encounter (Signed)
   Patient Name: Adrian Lambert  DOB: 22-Aug-1940 MRN: [id number]  Primary Cardiologist: [name], MD  Chart reviewed as part of pre-operative protocol coverage.   81 year old male with a history of severe aortic stenosis s/p Saint Jude mechanical AVR in 2004 on chronic Coumadin, systolic heart failure, minimal CAD per cath in 07/2021, persistent atrial fibrillation s/p ablation in 2018, syncope, hypertension, ascending aortic aneurysm, prostate cancer, and nephrolithiasis.    Most recent echo in January 2023 showed EF 35 to 40%, mild RV dysfunction, valve.  MRA chest in May 2023 showed stable 4.3 cm ascending aortic aneurysm.  He was last seen in the office by Allena Katz, FNP, on 01/02/2022 and was stable from a cardiac standpoint.  We received a surgical clearance request for screening colonoscopy scheduled for 02/13/2022 with Dr. Clarene Essex. Dr. Aundra Dubin, please advise on surgical clearance for this patient.   Patient with diagnosis of St Jude Mechanical AVR and afib on warfarin for anticoagulation.    Procedure: colonoscopy Date of procedure: 02/13/22   CrCl 52 ml/min Platelet count 181   Per office protocol, patient can hold warfarin for 5 days prior to procedure.   Patient WILL need bridging with Lovenox (enoxaparin) around procedure.   PCP manages warfarin, therefore PCP will need to coordinate bridge.    Lenna Sciara, NP 02/08/2022, 10:50 AM

## 2022-02-08 NOTE — Telephone Encounter (Signed)
Patient with diagnosis of St Jude Mechanical AVR and afib on warfarin for anticoagulation.    Procedure: colonoscopy Date of procedure: 02/13/22  CrCl 52 ml/min Platelet count 181  Per office protocol, patient can hold warfarin for 5 days prior to procedure.   Patient WILL need bridging with Lovenox (enoxaparin) around procedure.  PCP manages warfarin, therefore PCP will need to coordinate bridge.  Patient was bridged prior to cath per Dr. Aundra Dubin.  **This guidance is not considered finalized until pre-operative APP has relayed final recommendations.**

## 2022-02-09 DIAGNOSIS — Z01818 Encounter for other preprocedural examination: Secondary | ICD-10-CM | POA: Diagnosis not present

## 2022-02-09 DIAGNOSIS — Z7901 Long term (current) use of anticoagulants: Secondary | ICD-10-CM | POA: Diagnosis not present

## 2022-02-09 NOTE — Telephone Encounter (Signed)
Per Dr. Aundra Dubin, patient may proceed with colonoscopy without further cardiac testing. He recommends that patient be bridged with Lovenox while off warfarin. Warfarin is managed by primary care, therefore bridging instructions will need to be provided by them.  Please contact our office if you have questions.  Emmaline Life, NP-C    02/09/2022, 9:42 AM [place] [address] Office [phone] Fax [phone]

## 2022-02-14 DIAGNOSIS — M542 Cervicalgia: Secondary | ICD-10-CM | POA: Diagnosis not present

## 2022-02-14 DIAGNOSIS — M1990 Unspecified osteoarthritis, unspecified site: Secondary | ICD-10-CM | POA: Diagnosis not present

## 2022-02-15 ENCOUNTER — Encounter (HOSPITAL_COMMUNITY): Payer: Self-pay | Admitting: Gastroenterology

## 2022-02-16 DIAGNOSIS — M542 Cervicalgia: Secondary | ICD-10-CM | POA: Diagnosis not present

## 2022-02-16 DIAGNOSIS — M1990 Unspecified osteoarthritis, unspecified site: Secondary | ICD-10-CM | POA: Diagnosis not present

## 2022-02-21 DIAGNOSIS — M1990 Unspecified osteoarthritis, unspecified site: Secondary | ICD-10-CM | POA: Diagnosis not present

## 2022-02-21 DIAGNOSIS — M542 Cervicalgia: Secondary | ICD-10-CM | POA: Diagnosis not present

## 2022-02-22 ENCOUNTER — Ambulatory Visit (HOSPITAL_BASED_OUTPATIENT_CLINIC_OR_DEPARTMENT_OTHER): Payer: Medicare Other | Admitting: Anesthesiology

## 2022-02-22 ENCOUNTER — Ambulatory Visit (HOSPITAL_COMMUNITY)
Admission: RE | Admit: 2022-02-22 | Discharge: 2022-02-22 | Disposition: A | Discharge status: Home / Self Care | Payer: Medicare Other | Attending: Gastroenterology | Admitting: Gastroenterology

## 2022-02-22 ENCOUNTER — Encounter (HOSPITAL_COMMUNITY)
Admission: RE | Disposition: A | Discharge status: Home / Self Care | Payer: Self-pay | Source: Home / Self Care | Attending: Gastroenterology

## 2022-02-22 ENCOUNTER — Ambulatory Visit (HOSPITAL_COMMUNITY): Payer: Medicare Other | Admitting: Anesthesiology

## 2022-02-22 ENCOUNTER — Encounter (HOSPITAL_COMMUNITY): Payer: Self-pay | Admitting: Gastroenterology

## 2022-02-22 ENCOUNTER — Other Ambulatory Visit: Payer: Self-pay

## 2022-02-22 DIAGNOSIS — D125 Benign neoplasm of sigmoid colon: Secondary | ICD-10-CM | POA: Insufficient documentation

## 2022-02-22 DIAGNOSIS — I110 Hypertensive heart disease with heart failure: Secondary | ICD-10-CM

## 2022-02-22 DIAGNOSIS — K648 Other hemorrhoids: Secondary | ICD-10-CM | POA: Diagnosis not present

## 2022-02-22 DIAGNOSIS — K635 Polyp of colon: Secondary | ICD-10-CM | POA: Diagnosis not present

## 2022-02-22 DIAGNOSIS — Z1211 Encounter for screening for malignant neoplasm of colon: Secondary | ICD-10-CM | POA: Insufficient documentation

## 2022-02-22 DIAGNOSIS — D120 Benign neoplasm of cecum: Secondary | ICD-10-CM | POA: Diagnosis not present

## 2022-02-22 DIAGNOSIS — I5022 Chronic systolic (congestive) heart failure: Secondary | ICD-10-CM

## 2022-02-22 DIAGNOSIS — K5730 Diverticulosis of large intestine without perforation or abscess without bleeding: Secondary | ICD-10-CM | POA: Diagnosis not present

## 2022-02-22 DIAGNOSIS — Z139 Encounter for screening, unspecified: Secondary | ICD-10-CM | POA: Diagnosis not present

## 2022-02-22 HISTORY — PX: COLONOSCOPY WITH PROPOFOL: SHX5780

## 2022-02-22 HISTORY — PX: POLYPECTOMY: SHX5525

## 2022-02-22 SURGERY — COLONOSCOPY WITH PROPOFOL
Anesthesia: Monitor Anesthesia Care

## 2022-02-22 MED ORDER — PHENYLEPHRINE 80 MCG/ML (10ML) SYRINGE FOR IV PUSH (FOR BLOOD PRESSURE SUPPORT)
PREFILLED_SYRINGE | INTRAVENOUS | Status: DC | PRN
  Administered 2022-02-22: 160 ug via INTRAVENOUS
  Administered 2022-02-22 (×2): 80 ug via INTRAVENOUS

## 2022-02-22 MED ORDER — PROPOFOL 500 MG/50ML IV EMUL
INTRAVENOUS | Status: DC | PRN
  Administered 2022-02-22: 125 ug/kg/min via INTRAVENOUS

## 2022-02-22 MED ORDER — LACTATED RINGERS IV SOLN: INTRAVENOUS | Status: DC | PRN

## 2022-02-22 SURGICAL SUPPLY — 22 items

## 2022-02-22 NOTE — Anesthesia Preprocedure Evaluation (Signed)
Anesthesia Evaluation  Patient identified by MRN, date of birth, ID band Patient awake    Reviewed: Allergy & Precautions, NPO status , Patient's Chart, lab work & pertinent test results  Airway Mallampati: II  TM Distance: >3 FB Neck ROM: Full    Dental no notable dental hx.    Pulmonary neg pulmonary ROS,    Pulmonary exam normal breath sounds clear to auscultation       Cardiovascular hypertension, +CHF  Normal cardiovascular exam Rhythm:Regular Rate:Normal     Neuro/Psych negative neurological ROS  negative psych ROS   GI/Hepatic Neg liver ROS, GERD  ,  Endo/Other  negative endocrine ROS  Renal/GU negative Renal ROS  negative genitourinary   Musculoskeletal negative musculoskeletal ROS (+)   Abdominal   Peds negative pediatric ROS (+)  Hematology negative hematology ROS (+)   Anesthesia Other Findings   Reproductive/Obstetrics negative OB ROS                             Anesthesia Physical Anesthesia Plan  ASA: 3  Anesthesia Plan: MAC   Post-op Pain Management: Minimal or no pain anticipated   Induction: Intravenous  PONV Risk Score and Plan: 1 and Propofol infusion and Treatment may vary due to age or medical condition  Airway Management Planned: Simple Face Mask  Additional Equipment:   Intra-op Plan:   Post-operative Plan:   Informed Consent: I have reviewed the patients History and Physical, chart, labs and discussed the procedure including the risks, benefits and alternatives for the proposed anesthesia with the patient or authorized representative who has indicated his/her understanding and acceptance.     Dental advisory given  Plan Discussed with: CRNA and Surgeon  Anesthesia Plan Comments:         Anesthesia Quick Evaluation

## 2022-02-22 NOTE — Progress Notes (Signed)
Adrian Lambert 12:23 PM  Subjective: Patient doing well without any problems since we recently saw him in the office and he has no GI complaints his previous colonoscopy was reviewed  Objective: Vital signs stable afebrile no acute distress exam please see preassessment evaluation  Assessment: Patient due for colonic screening  Plan: Okay to proceed with colonoscopy with anesthesia assistance  [facility]  office [phone] After 5PM or if no answer call [phone]

## 2022-02-22 NOTE — Transfer of Care (Signed)
Immediate Anesthesia Transfer of Care Note  Patient: Adrian Lambert  Procedure(s) Performed: COLONOSCOPY WITH PROPOFOL POLYPECTOMY  Patient Location: Endoscopy Unit  Anesthesia Type:MAC  Level of Consciousness: awake, alert  and oriented  Airway & Oxygen Therapy: Patient Spontanous Breathing and Patient connected to face mask oxygen  Post-op Assessment: Report given to RN and Post -op Vital signs reviewed and stable  Post vital signs: Reviewed and stable  Last Vitals:  Vitals Value Taken Time  BP 94/62 02/22/22 1300  Temp 36.8 C 02/22/22 1258  Pulse 75 02/22/22 1300  Resp 13 02/22/22 1300  SpO2 99 % 02/22/22 1300  Vitals shown include unvalidated device data.  Last Pain:  Vitals:   02/22/22 1258  TempSrc: Temporal  PainSc: Asleep         Complications: No notable events documented.

## 2022-02-22 NOTE — Discharge Instructions (Signed)
Begin with soft solid breakfast food first meal today and if doing well may resume Coumadin tomorrow and may take Lovenox shot today and talk to your cardiologist regarding how many days of Lovenox you need before stopping and follow-up with me as needed from a GI standpoint otherwise call in 1 week for pathology

## 2022-02-22 NOTE — Anesthesia Postprocedure Evaluation (Signed)
Anesthesia Post Note  Patient: Adrian Lambert  Procedure(s) Performed: COLONOSCOPY WITH PROPOFOL POLYPECTOMY     Patient location during evaluation: PACU Anesthesia Type: MAC Level of consciousness: awake and alert Pain management: pain level controlled Vital Signs Assessment: post-procedure vital signs reviewed and stable Respiratory status: spontaneous breathing, nonlabored ventilation, respiratory function stable and patient connected to nasal cannula oxygen Cardiovascular status: stable and blood pressure returned to baseline Postop Assessment: no apparent nausea or vomiting Anesthetic complications: no   No notable events documented.  Last Vitals:  Vitals:   02/22/22 1301 02/22/22 1302  BP: 94/62 109/70  Pulse: 72 72  Resp: 13 19  Temp:    SpO2: 99% 100%    Last Pain:  Vitals:   02/22/22 1258  TempSrc: Temporal  PainSc: Asleep                 Naylin Burkle S

## 2022-02-22 NOTE — Op Note (Signed)
[facility] Patient Name: Adrian Lambert Procedure Date: 02/22/2022 MRN: [id number] Attending MD: Clarene Essex , MD Date of Birth: [birth year] CSN: [id number] Age: 81 Admit Type: Outpatient Procedure:                Colonoscopy Indications:              Screening for colorectal malignant neoplasm, Last                            colonoscopy: 2010 Providers:                Clarene Essex, MD, Dulcy Fanny, Despina Pole,                            Technician Referring MD:              Medicines:                Monitored Anesthesia Care Complications:            No immediate complications. Estimated Blood Loss:     Estimated blood loss: none. Procedure:                Pre-Anesthesia Assessment:                           - Prior to the procedure, a History and Physical                            was performed, and patient medications and                            allergies were reviewed. The patient's tolerance of                            previous anesthesia was also reviewed. The risks                            and benefits of the procedure and the sedation                            options and risks were discussed with the patient.                            All questions were answered, and informed consent                            was obtained. Prior Anticoagulants: The patient has                            taken Coumadin (warfarin), last dose was 5 days                            prior to procedure. ASA Grade Assessment: III - A                            patient with severe  systemic disease. After                            reviewing the risks and benefits, the patient was                            deemed in satisfactory condition to undergo the                            procedure.                           After obtaining informed consent, the colonoscope                            was passed under direct vision. Throughout the                             procedure, the patient's blood pressure, pulse, and                            oxygen saturations were monitored continuously. The                            CF-HQ190L ([id number]) Olympus colonoscope was                            introduced through the anus and advanced to the the                            cecum, identified by appendiceal orifice and                            ileocecal valve. The colonoscopy was somewhat                            difficult due to multiple diverticula in the colon                            and unsatisfactory bowel prep. Successful                            completion of the procedure was aided by lavage.                            The patient tolerated the procedure well. The                            quality of the bowel preparation was fair right                            side better than left with significant increase  stool balls mostly on the left. Scope In: 12:33:22 PM Scope Out: 12:54:02 PM Scope Withdrawal Time: 0 hours 13 minutes 24 seconds  Total Procedure Duration: 0 hours 20 minutes 40 seconds  Findings:      Internal hemorrhoids were found during retroflexion, during perianal       exam and during digital exam. The hemorrhoids were small.      Multiple small and large-mouthed diverticula were found in the sigmoid       colon.      Scattered small and large-mouthed diverticula were found in the       descending colon, transverse colon and ascending colon.      A small polyp was found in the distal sigmoid colon. The polyp was       semi-pedunculated. The polyp was removed with a hot snare. Resection and       retrieval were complete.      Two semi-sessile polyps were found in the cecum. The polyps were       diminutive in size. These were biopsied with a cold forceps for       histology.      The exam was otherwise without abnormality. Impression:               - Preparation of the colon was fair.                            - Internal hemorrhoids.                           - Diverticulosis in the sigmoid colon.                           - Diverticulosis in the descending colon, in the                            transverse colon and in the ascending colon.                           - One small polyp in the distal sigmoid colon,                            removed with a hot snare. Resected and retrieved.                           - Two diminutive polyps in the cecum. Biopsied.                           - The examination was otherwise normal. Moderate Sedation:      Not Applicable - Patient had care per Anesthesia. Recommendation:           - Patient has a contact number available for                            emergencies. The signs and symptoms of potential                            delayed complications were discussed with the  patient. Return to normal activities tomorrow.                            Written discharge instructions were provided to the                            patient.                           - Soft diet today.                           - Continue present medications.                           - Await pathology results.                           - Repeat colonoscopy date to be determined after                            pending pathology results are reviewed for                            surveillance based on pathology results.                           - Return to GI office PRN.                           - Telephone GI clinic for pathology results in 1                            week.                           - Telephone GI clinic if symptomatic PRN.                           - Resume Coumadin (warfarin) at prior dose tomorrow                            If doing well and refer to Coumadin Clinic for                            further adjustment of therapy.                           - Resume Lovenox (enoxaparin) at prior dose today.                             And call your cardiologist to see him in a more                            days you need to take Procedure Code(s):        ---  Professional ---                           [phone], Colonoscopy, flexible; with removal of                            tumor(s), polyp(s), or other lesion(s) by snare                            technique                           45380, 59, Colonoscopy, flexible; with biopsy,                            single or multiple Diagnosis Code(s):        --- Professional ---                           K63.5, Polyp of colon                           Z12.11, Encounter for screening for malignant                            neoplasm of colon                           K57.30, Diverticulosis of large intestine without                            perforation or abscess without bleeding CPT copyright 2019 American Medical Association. All rights reserved. The codes documented in this report are preliminary and upon coder review may  be revised to meet current compliance requirements. Clarene Essex, MD 02/22/2022 1:08:07 PM This report has been signed electronically. Number of Addenda: 0

## 2022-02-23 DIAGNOSIS — M542 Cervicalgia: Secondary | ICD-10-CM | POA: Diagnosis not present

## 2022-02-23 DIAGNOSIS — M1990 Unspecified osteoarthritis, unspecified site: Secondary | ICD-10-CM | POA: Diagnosis not present

## 2022-02-23 LAB — SURGICAL PATHOLOGY

## 2022-02-24 ENCOUNTER — Encounter (HOSPITAL_COMMUNITY): Payer: Self-pay | Admitting: Gastroenterology

## 2022-02-28 DIAGNOSIS — Z7901 Long term (current) use of anticoagulants: Secondary | ICD-10-CM | POA: Diagnosis not present

## 2022-02-28 DIAGNOSIS — Z952 Presence of prosthetic heart valve: Secondary | ICD-10-CM | POA: Diagnosis not present

## 2022-02-28 DIAGNOSIS — M1990 Unspecified osteoarthritis, unspecified site: Secondary | ICD-10-CM | POA: Diagnosis not present

## 2022-02-28 DIAGNOSIS — M542 Cervicalgia: Secondary | ICD-10-CM | POA: Diagnosis not present

## 2022-03-02 DIAGNOSIS — M542 Cervicalgia: Secondary | ICD-10-CM | POA: Diagnosis not present

## 2022-03-02 DIAGNOSIS — M1990 Unspecified osteoarthritis, unspecified site: Secondary | ICD-10-CM | POA: Diagnosis not present

## 2022-03-06 ENCOUNTER — Other Ambulatory Visit (HOSPITAL_COMMUNITY): Payer: Self-pay | Admitting: Cardiology

## 2022-03-07 DIAGNOSIS — M1990 Unspecified osteoarthritis, unspecified site: Secondary | ICD-10-CM | POA: Diagnosis not present

## 2022-03-07 DIAGNOSIS — M542 Cervicalgia: Secondary | ICD-10-CM | POA: Diagnosis not present

## 2022-03-09 DIAGNOSIS — M542 Cervicalgia: Secondary | ICD-10-CM | POA: Diagnosis not present

## 2022-03-09 DIAGNOSIS — M1990 Unspecified osteoarthritis, unspecified site: Secondary | ICD-10-CM | POA: Diagnosis not present

## 2022-03-15 DIAGNOSIS — Z7901 Long term (current) use of anticoagulants: Secondary | ICD-10-CM | POA: Diagnosis not present

## 2022-03-16 DIAGNOSIS — M1990 Unspecified osteoarthritis, unspecified site: Secondary | ICD-10-CM | POA: Diagnosis not present

## 2022-03-16 DIAGNOSIS — M542 Cervicalgia: Secondary | ICD-10-CM | POA: Diagnosis not present

## 2022-03-21 DIAGNOSIS — M542 Cervicalgia: Secondary | ICD-10-CM | POA: Diagnosis not present

## 2022-03-21 DIAGNOSIS — M1990 Unspecified osteoarthritis, unspecified site: Secondary | ICD-10-CM | POA: Diagnosis not present

## 2022-03-23 DIAGNOSIS — M542 Cervicalgia: Secondary | ICD-10-CM | POA: Diagnosis not present

## 2022-03-23 DIAGNOSIS — M1990 Unspecified osteoarthritis, unspecified site: Secondary | ICD-10-CM | POA: Diagnosis not present

## 2022-03-29 ENCOUNTER — Encounter (HOSPITAL_COMMUNITY): Payer: Self-pay | Admitting: Cardiology

## 2022-03-29 ENCOUNTER — Other Ambulatory Visit (HOSPITAL_COMMUNITY): Payer: Self-pay

## 2022-03-29 ENCOUNTER — Ambulatory Visit (HOSPITAL_COMMUNITY)
Admission: RE | Admit: 2022-03-29 | Discharge: 2022-03-29 | Disposition: A | Discharge status: Home / Self Care | Payer: Medicare Other | Source: Ambulatory Visit | Attending: Cardiology | Admitting: Cardiology

## 2022-03-29 VITALS — BP 100/60 | HR 70 | Wt 202.4 lb

## 2022-03-29 DIAGNOSIS — I480 Paroxysmal atrial fibrillation: Secondary | ICD-10-CM

## 2022-03-29 DIAGNOSIS — R000 Tachycardia, unspecified: Secondary | ICD-10-CM | POA: Diagnosis not present

## 2022-03-29 DIAGNOSIS — I5022 Chronic systolic (congestive) heart failure: Secondary | ICD-10-CM | POA: Diagnosis not present

## 2022-03-29 DIAGNOSIS — I4891 Unspecified atrial fibrillation: Secondary | ICD-10-CM | POA: Diagnosis not present

## 2022-03-29 DIAGNOSIS — Z952 Presence of prosthetic heart valve: Secondary | ICD-10-CM | POA: Insufficient documentation

## 2022-03-29 DIAGNOSIS — I7121 Aneurysm of the ascending aorta, without rupture: Secondary | ICD-10-CM | POA: Insufficient documentation

## 2022-03-29 DIAGNOSIS — I4892 Unspecified atrial flutter: Secondary | ICD-10-CM

## 2022-03-29 DIAGNOSIS — Z7901 Long term (current) use of anticoagulants: Secondary | ICD-10-CM | POA: Diagnosis not present

## 2022-03-29 DIAGNOSIS — I110 Hypertensive heart disease with heart failure: Secondary | ICD-10-CM | POA: Insufficient documentation

## 2022-03-29 LAB — BASIC METABOLIC PANEL
Anion gap: 9 (ref 5–15)
BUN: 19 mg/dL (ref 8–23)
CO2: 26 mmol/L (ref 22–32)
Calcium: 9.3 mg/dL (ref 8.9–10.3)
Chloride: 105 mmol/L (ref 98–111)
Creatinine, Ser: 1.24 mg/dL (ref 0.61–1.24)
GFR, Estimated: 59 mL/min — ABNORMAL LOW (ref >60)
Glucose, Bld: 109 mg/dL — ABNORMAL HIGH (ref 70–99)
Potassium: 4.7 mmol/L (ref 3.5–5.1)
Sodium: 140 mmol/L (ref 135–145)

## 2022-03-29 LAB — BRAIN NATRIURETIC PEPTIDE: B Natriuretic Peptide: 708.6 pg/mL — ABNORMAL HIGH (ref 0.0–100.0)

## 2022-03-29 LAB — PROTIME-INR
INR: 2.3 — ABNORMAL HIGH (ref 0.8–1.2)
Prothrombin Time: 25 seconds — ABNORMAL HIGH (ref 11.4–15.2)

## 2022-03-29 MED ORDER — SPIRONOLACTONE 25 MG PO TABS: 25.0000 mg | ORAL_TABLET | Freq: Every day | ORAL | 3 refills | Status: DC

## 2022-03-29 MED ORDER — TORSEMIDE 20 MG PO TABS: 20.0000 mg | ORAL_TABLET | Freq: Every day | ORAL | 3 refills | Status: DC

## 2022-03-29 NOTE — Patient Instructions (Signed)
Increase Torsemide to 20 mg daily.  Increase Spironolactone to '25mg'$  daily.  Labs done today, your results will be available in MyChart, we will contact you for abnormal readings.   You are scheduled for a Cardioversion on Wednesday  September 13 th  with Dr. [name].  Please arrive at the [facility] (Main Entrance A) at [facility]: [address] [place], [place] [address] at 11.00 am. (1 hour prior to procedure unless lab work is needed; if lab work is needed arrive 1.5 hours ahead)  DIET: Nothing to eat or drink after midnight except a sip of water with medications (see medication instructions below)  FYI: For your safety, and to allow [place] to monitor your vital signs accurately during the surgery/procedure we request that   if you have artificial nails, gel coating, SNS etc. Please have those removed prior to your surgery/procedure. Not having the nail coverings /polish removed may result in cancellation or delay of your surgery/procedure.   Medication Instructions: Hold Torsemide and Spironolactone the morning of your procedure   Continue your anticoagulant: warfarin  You will need to continue your anticoagulant after your procedure until you are told by your  Provider that it is safe to stop   Labs: If patient is on Coumadin, patient needs pt/INR, CBC, BMET within 3 days (No pt/INR needed for patients taking Xarelto, Eliquis, Pradaxa) For patients receiving anesthesia for TEE and all Cardioversion patients: BMET, CBC within 1 week   your lab work will be done at the hospital prior to your procedure - you will need to arrive 1  hours ahead of your procedure  You must have a responsible person to drive you home and stay in the waiting area during your procedure. Failure to do so could result in cancellation.  Bring your insurance cards.  *Special Note: Every effort is made to have your procedure done on time. Occasionally there are emergencies that occur at the  hospital that may cause delays. Please be patient if a delay does occur.    Your physician recommends that you schedule a follow-up appointment in: 3 weeks  If you have any questions or concerns before your next appointment please send [place] a message through [place] or call our office at [phone].    TO LEAVE A MESSAGE FOR THE NURSE SELECT OPTION 2, PLEASE LEAVE A MESSAGE INCLUDING: YOUR NAME DATE OF BIRTH CALL BACK NUMBER REASON FOR CALL**this is important as we prioritize the call backs  YOU WILL RECEIVE A CALL BACK THE SAME DAY AS LONG AS YOU CALL BEFORE 4:00 PM  At the [facility], you and your health needs are our priority. As part of our continuing mission to provide you with exceptional heart care, we have created designated Provider Care Teams. These Care Teams include your primary Cardiologist (physician) and Advanced Practice Providers (APPs- Physician Assistants and Nurse Practitioners) who all work together to provide you with the care you need, when you need it.   You may see any of the following providers on your designated Care Team at your next follow up: Dr Glori Bickers Dr [name] Dr. Roxana Hires, NP Lyda Jester, [place] [facility] [place], [place] Forestine Na, NP Audry Riles, PharmD   Please be sure to bring in all your medications bottles to every appointment.

## 2022-03-29 NOTE — H&P (View-Only) (Signed)
PCP: Dr Rex Kras - Also manages INR Cardiology: Dr. Martinique  HF Cardiology: Dr Aundra Dubin   HPI: Adrian Lambert is a 81 y.o. male with a history of severe AS s/p St Jude mechanical AVR in 03/2003 on chronic coumadin, HTN, obesity, and mild LV dysfxn  EF 40-45% on 01/2016 echo.  Prior to admit in 1/18, he had a cough and dyspnea that was thought to be bronchitis so he was treated with abx and oral steroids. He was seen in cardiology office in 1/18 and was noted to be in rapid atrial fibrillation with significant volume overload.   Admitted 1/23 through 08/29/16 with marked volume overload, lower extremity edema, and atrial fibrillation with RVR.  Initial cardioversion failed and he was difficult to diurese. He was placed on milrinone for low output and diuresed with IV lasix with good response.  Weight came down considerably. Amiodarone was begun. Once fully diuresed, he had successful DC-CV on 08/28/16. Discharged on torsemide 60 mg daily. Discharge weight 212 pounds. Echo and TEE in 1/18 had shown fall in EF to around 25%.   He continued to lose weight as an outpatient.  He had a syncopal episode where he stood up from sitting and got lightheaded with transient loss of consciousness.  He had been having orthostatic symptoms.  We saw him in the office and cut back on torsemide from 60 mg daily down eventually to 20 mg daily.  Creatinine up some to 1.7 at that time.    At a prior appt, he was back in atrial fibrillation.  He was set up for DCCV in 3/18, but was in NSR when he arrived to short stay. He had atrial fibrillation ablation with Dr. Curt Bears in 5/18.   Echo was done in 5/18, EF up to 45% with mild diffuse hypokinesis, normal mechanical aortic valve, mildly decreased RV systolic function.    Echo was repeated in 5/19, EF 35-40%, diffuse hypokinesis, mildly decreased RV systolic function, stable mechanical aortic valve.   Echo in 11/21 showed EF up to 50-55%, normal RV size and systolic function, mechanical  aortic valve looks ok, normal IVC, ascending aorta 4.2 cm. Echo in 12/21 showed EF 60-65%, mild RV enlargement, normal RV function, mechanical aortic valve mean gradient 10 mmHg, ascending aorta 4.1 cm.  MRA chest in 1/22 showed 4.3 cm ascending aorta.   Echo 1/23 showed EF 35-40%, global hypokinesis, mildly decreased RV systolic function, normal mechanical aortic valve, moderate Adrian, PASP 51 mmHg. With EF back down and mild volume overload, he was started on Farxiga, torsemide increased and R/LHC arranged.  R/LHC (1/23) showed mild elevated PCWP with mild pulmonary venous hypertension, preserved CO and minimal CAD.  Patient returns for followup of CHF.  Weight is stable. He denies palpitations but is in atrial flutter today.  He has not been "feeling as strong" for the last 2 months.  He is short of breath after walking about 1/4 miles.  No chest pain.  No orthopnea/PND.  Rare lightheadedness if he stands too fast.   ECG (personally reviewed): Atrial flutter, LAFB, iRBBB  Labs (09/04/2016):  Hgb 15.9, K 5.0, Creatinine 1.77, digoxin 0.7, BNP 326  Labs (3/18): digoxin 0.8, K 4.8, creatinine 1.44, TSH normal, AST 54, ALT 49 Labs (4/18): K 4.3, creatinine 1.3 Labs (7/18): K 3.8, creatinine 0.9, hgb 11.9 Labs (11/18): K 4.9, creatinine 1.0, hgb 13.5 Labs (3/19): K 4.7,creatinine 0.99 Labs (5/19): K 4.6, creatinine 0.98 Labs (9/19): K 4.2, creatinine 0.91 Labs (12/19): K 4.6, creatinine  1.14 Labs (3/20): K 4.6, creatinine 0.98 Labs (7/20): creatinine 1.07 Labs (9/21): K 5, creatinine 1.13 Labs (12/21): LDL 77, K 4.1, creatinine 0.97 Labs (6/22): K 4.8, creatinine 1.02 Labs (10/22): K 4.9, creatinine 1.02 Labs (11/22): K 4.9, creatinine 1.02 Labs (1/23): K 4.5, creatinine 1.18 Labs (2/23): K 4.8, creatinine 1.29 Labs (5/23): LDL 92 Labs (6/23): K 4.7, creatinine 1.16, BNP 521  PMH: 1. Aortic stenosis: # 25 St Jude mechanical aortic valve in 9/14.   2. Chronic systolic CHF: Echo in 8/11 with  EF 45-50%.  Admitted in 1/18 with afib with RVR, ?tachycardia-mediated cardiomyopathy.  - Echo (1/18) with EF 25-30%, stable mechanical aortic valve.  - TEE (1/18) with EF 20-25%, stable mechanical aortic valve, moderate Adrian, small PFO.  - Admission 1/18 with acute on chronic systolic CHF, required milrinone, extensively diuresed.  - Echo (5/18) with EF 45%, diffuse hypokinesis, normal RV size with mildly decreased systolic function, mechanical aortic valve with mean gradient 14 mmHg, ascending aorta 4.4 cm.  - Echo (5/19) with EF 35-40%, diffuse hypokinesis, mild LVH, mildly decreased RV systolic function, mechanical aortic valve looked ok.   - Echo (11/20) with EF up to 50-55%, normal RV size and systolic function, mechanical aortic valve looks ok, normal IVC, ascending aorta 4.2 cm.  - Echo (12/21) with EF 60-65%, mild RV enlargement, normal RV function, mechanical aortic valve mean gradient 10 mmHg, ascending aorta 4.1 cm. - Echo (1/23) with EF 35-40%, global hypokinesis, mildly decreased RV systolic function, normal mechanical aortic valve, moderate Adrian, PASP 51 mmHg.  - L/RHC (1/23): minimal CAD, RA mean 6, PA 35/18 (25) PCWP mean 17, preserved CO/CI 4.87/2.39, PVR 1.6 WU. 3. Atrial fibrillation: First noted in 9/14, uncertain how long it had been present (with RVR).  08/28/2016 Successful DC-CV --> NSR.  Back in atrial fibrillation at 09/18/16 office visit.  Set up for DCCV in 3/18 but back in NSR.   - Atrial fibrillation ablation in 5/18. Now off amiodarone.  4. 09/04/2016 Syncope--> over-diuresis most likely.  5. HTN 6. Ascending aortic aneurysm: 4.4 cm on 7/17 echo, 4.4 cm on 5/18 echo.  - MRA chest (4/19): 4.5 cm ascending aortic aneurysm.  - MRA chest (7/20): 4.5 cm ascending aortic aneurysm.  - MRA chest (1/22): 4.3 cm ascending aortic aneurysm (breast abnormality, recommend mammogram => consistent with benign hamartoma) - MRA chest (5/23): 4.3 cm ascending aortic aneurysm 7. Prostate  cancer: Treated with radioactive seed implantation.  8. Nephrolithiasis.  9. PFTs (4/18): Mild restriction.  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  Tobacco Use   Smoking status: Never   Smokeless tobacco: Never  Vaping Use   Vaping Use: Never used  Substance and Sexual Activity   Alcohol use: Yes    Alcohol/week: 3.0 standard drinks of alcohol    Types: 3 Cans of beer per week    Comment:  "may have 2-3 beers on the weekend"   Drug use: No   Sexual activity: Not on file  Other Topics Concern   Not on file  Social History Narrative   Not on file  Social Determinants of Health   Financial Resource Strain: Not on file  Food Insecurity: Not on file  Transportation Needs: Not on file  Physical Activity: Not on file  Stress: Not on file  Social Connections: Not on file  Intimate Partner Violence: Not on file    FH:  Family History  Problem Relation Age of Onset   Heart failure Mother    Diabetes Mother    Prostate cancer Father     Current Outpatient Medications  Medication Sig Dispense Refill   acetaminophen (TYLENOL) 500 MG tablet Take 1,000 mg by mouth every evening.     allopurinol (ZYLOPRIM) 300 MG tablet Take 300 mg by mouth in the morning.     amoxicillin (AMOXIL) 500 MG capsule Take 2,000 mg by mouth See admin instructions. Take 2000 mg 1 hour prior to dental work     aspirin 81 MG chewable tablet Chew 1 tablet (81 mg total) by mouth daily. 30 tablet 6   colchicine 0.6 MG tablet Take 0.6 mg by mouth daily as needed (for gout).      docusate sodium (COLACE) 100 MG capsule Take 100 mg by mouth 2 (two) times daily.     ENTRESTO 24-26 MG  TAKE 1 TABLET BY MOUTH TWICE A DAY 180 tablet 2   FARXIGA 10 MG TABS tablet TAKE 1 TABLET BY MOUTH DAILY BEFORE BREAKFAST. 30 tablet 6   FIBER PO Take 2 capsules by mouth 2 (two) times daily.     folic acid (FOLVITE) 1 MG tablet TAKE 1 TABLET(1 MG) BY MOUTH DAILY 90 tablet 3   metoprolol succinate (TOPROL-XL) 25 MG 24 hr tablet Take 1 tablet (25 mg total) by mouth at bedtime. 30 tablet 3   tamsulosin (FLOMAX) 0.4 MG CAPS capsule Take 0.4 mg by mouth every other day. Alternating days with tolterodine     tolterodine (DETROL LA) 4 MG 24 hr capsule Take 4 mg by mouth every other day. Alternating days with the tamsulosin     triamcinolone cream (KENALOG) 0.1 % Apply 1 Application topically 2 (two) times daily as needed for rash.     warfarin (COUMADIN) 5 MG tablet Take 0.5-1 tablets (2.5-5 mg total) by mouth See admin instructions. 1/19 and 1/20  take 7.'5mg'$  (1 and 1/2 tab) then take 1 tab ('5mg'$ ) daily until seen by MD  Take 1 tablet (5 mg) by mouth on Sundays, Mondays, Wednesdays, Thursdays & Saturdays in the evening. Take 0.5 tablet (2.5 mg) by mouth on Tuesdays & Fridays in the evening. (Patient taking differently: Take 2.5 mg by mouth every Tuesday.) 45 tablet 11   spironolactone (ALDACTONE) 25 MG tablet Take 1 tablet (25 mg total) by mouth daily. 90 tablet 3   torsemide (DEMADEX) 20 MG tablet Take 1 tablet (20 mg total) by mouth daily. Take 10 mg alternating every other day with 20 mg. 90 tablet 3   No current facility-administered medications for this encounter.   BP 100/60   Pulse 70   Wt 91.8 kg (202 lb 6.4 oz)   SpO2 96%   BMI 29.04 kg/m   Wt Readings from Last 3 Encounters:  03/29/22 91.8 kg (202 lb 6.4 oz)  02/22/22 89.8 kg (198 lb)  01/02/22 91.9 kg (202 lb 9.6 oz)   PHYSICAL EXAM: General: NAD Neck: JVP 8 cm with HJR, no thyromegaly or thyroid nodule.  Lungs: Clear to auscultation bilaterally with normal respiratory effort. CV: Nondisplaced PMI.  Heart irregular with mechanical  S2, no S3/S4, no  murmur.  1+ ankle edema.  No carotid bruit.  Normal pedal pulses.  Abdomen: Soft, nontender, no hepatosplenomegaly, no distention.  Skin: Intact without lesions or rashes.  Neurologic: Alert and oriented x 3.  Psych: Normal affect. Extremities: No clubbing or cyanosis.  HEENT: Normal.   ASSESSMENT & PLAN: 1. Chronic systolic CHF: EF on TEE in 1/18 20-25%, mechanical aortic valve ok. 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  He required milrinone to assist diuresis during 1/18-2/18 admission.  Now that he is back in NSR s/p atrial fibrillation ablation, echo in 5/18 showed EF back up to 45%.  Echo in 5/19 showed mildly lower systolic function, EF 70-78%. Echo in 12/21 showed EF up to 60-65%.  However, echo 1/23 showed EF back down to 35-40% with mild RV dysfunction.  Coronary angiography in 1/23 showed nonobstructive mild CAD.  NYHA class II symptoms, he is volume overloaded on exam.  This may be worsened by atrial flutter.  - Increase torsemide to 20 mg daily.  BMET/BNP today with BMET in 10 days.  - Continue Entresto 24/26 mg bid.      - Continue Toprol XL 25 mg daily.   - Continue Farxiga 10 mg daily.  - Increase spironolactone to 25 mg daily.  2. Mechanical aortic valve: Continue warfarin and ASA 81. Valve looked ok last echo.  3. Atrial fibrillation/atrial flutter: Admitted 1/18 with atrial fibrillation with RVR and CHF.  EF down to 20-25%.  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  Need to keep him in NSR. DCCV during 2/18 admission and now s/p atrial fibrillation ablation.  Today, he is noted to be in atrial flutter.  He has felt more fatigued recently. I suspect he will not tolerate AFL well long-term.  - I will get his INR readings from PCP and check INR today.  If INRs have been therapeutic for 1 month, we can proceed with DCCV  (check INR day of procedure).  We discussed risks/benefits and he agrees to procedure.  - He is now off amiodarone.  He has not been noted to have atrial arrhythmias since 2018, therefore will leave off amiodarone for now and proceed with DCCV as above, hopefully he will remain in NSR for a prolonged period of time as he had done previously.  - Continue warfarin. No abnormal bleeding.  4. Ascending aortic aneurysm: 4.3 cm on 5/23 MRA chest.    Followup with APP in 3 wks.   [name]  03/29/2022

## 2022-03-29 NOTE — Progress Notes (Signed)
PCP: Dr Rex Kras - Also manages INR Cardiology: Dr. Martinique  HF Cardiology: Dr Aundra Dubin   HPI: Adrian Lambert is a 81 y.o. male with a history of severe AS s/p St Jude mechanical AVR in 03/2003 on chronic coumadin, HTN, obesity, and mild LV dysfxn  EF 40-45% on 01/2016 echo.  Prior to admit in 1/18, he had a cough and dyspnea that was thought to be bronchitis so he was treated with abx and oral steroids. He was seen in cardiology office in 1/18 and was noted to be in rapid atrial fibrillation with significant volume overload.   Admitted 1/23 through 08/29/16 with marked volume overload, lower extremity edema, and atrial fibrillation with RVR.  Initial cardioversion failed and he was difficult to diurese. He was placed on milrinone for low output and diuresed with IV lasix with good response.  Weight came down considerably. Amiodarone was begun. Once fully diuresed, he had successful DC-CV on 08/28/16. Discharged on torsemide 60 mg daily. Discharge weight 212 pounds. Echo and TEE in 1/18 had shown fall in EF to around 25%.   He continued to lose weight as an outpatient.  He had a syncopal episode where he stood up from sitting and got lightheaded with transient loss of consciousness.  He had been having orthostatic symptoms.  We saw him in the office and cut back on torsemide from 60 mg daily down eventually to 20 mg daily.  Creatinine up some to 1.7 at that time.    At a prior appt, he was back in atrial fibrillation.  He was set up for DCCV in 3/18, but was in NSR when he arrived to short stay. He had atrial fibrillation ablation with Dr. Curt Bears in 5/18.   Echo was done in 5/18, EF up to 45% with mild diffuse hypokinesis, normal mechanical aortic valve, mildly decreased RV systolic function.    Echo was repeated in 5/19, EF 35-40%, diffuse hypokinesis, mildly decreased RV systolic function, stable mechanical aortic valve.   Echo in 11/21 showed EF up to 50-55%, normal RV size and systolic function, mechanical  aortic valve looks ok, normal IVC, ascending aorta 4.2 cm. Echo in 12/21 showed EF 60-65%, mild RV enlargement, normal RV function, mechanical aortic valve mean gradient 10 mmHg, ascending aorta 4.1 cm.  MRA chest in 1/22 showed 4.3 cm ascending aorta.   Echo 1/23 showed EF 35-40%, global hypokinesis, mildly decreased RV systolic function, normal mechanical aortic valve, moderate Adrian, PASP 51 mmHg. With EF back down and mild volume overload, he was started on Farxiga, torsemide increased and R/LHC arranged.  R/LHC (1/23) showed mild elevated PCWP with mild pulmonary venous hypertension, preserved CO and minimal CAD.  Patient returns for followup of CHF.  Weight is stable. He denies palpitations but is in atrial flutter today.  He has not been "feeling as strong" for the last 2 months.  He is short of breath after walking about 1/4 miles.  No chest pain.  No orthopnea/PND.  Rare lightheadedness if he stands too fast.   ECG (personally reviewed): Atrial flutter, LAFB, iRBBB  Labs (09/04/2016):  Hgb 15.9, K 5.0, Creatinine 1.77, digoxin 0.7, BNP 326  Labs (3/18): digoxin 0.8, K 4.8, creatinine 1.44, TSH normal, AST 54, ALT 49 Labs (4/18): K 4.3, creatinine 1.3 Labs (7/18): K 3.8, creatinine 0.9, hgb 11.9 Labs (11/18): K 4.9, creatinine 1.0, hgb 13.5 Labs (3/19): K 4.7,creatinine 0.99 Labs (5/19): K 4.6, creatinine 0.98 Labs (9/19): K 4.2, creatinine 0.91 Labs (12/19): K 4.6, creatinine  1.14 Labs (3/20): K 4.6, creatinine 0.98 Labs (7/20): creatinine 1.07 Labs (9/21): K 5, creatinine 1.13 Labs (12/21): LDL 77, K 4.1, creatinine 0.97 Labs (6/22): K 4.8, creatinine 1.02 Labs (10/22): K 4.9, creatinine 1.02 Labs (11/22): K 4.9, creatinine 1.02 Labs (1/23): K 4.5, creatinine 1.18 Labs (2/23): K 4.8, creatinine 1.29 Labs (5/23): LDL 92 Labs (6/23): K 4.7, creatinine 1.16, BNP 521  PMH: 1. Aortic stenosis: # 25 St Jude mechanical aortic valve in 9/14.   2. Chronic systolic CHF: Echo in 1/60 with  EF 45-50%.  Admitted in 1/18 with afib with RVR, ?tachycardia-mediated cardiomyopathy.  - Echo (1/18) with EF 25-30%, stable mechanical aortic valve.  - TEE (1/18) with EF 20-25%, stable mechanical aortic valve, moderate Adrian, small PFO.  - Admission 1/18 with acute on chronic systolic CHF, required milrinone, extensively diuresed.  - Echo (5/18) with EF 45%, diffuse hypokinesis, normal RV size with mildly decreased systolic function, mechanical aortic valve with mean gradient 14 mmHg, ascending aorta 4.4 cm.  - Echo (5/19) with EF 35-40%, diffuse hypokinesis, mild LVH, mildly decreased RV systolic function, mechanical aortic valve looked ok.   - Echo (11/20) with EF up to 50-55%, normal RV size and systolic function, mechanical aortic valve looks ok, normal IVC, ascending aorta 4.2 cm.  - Echo (12/21) with EF 60-65%, mild RV enlargement, normal RV function, mechanical aortic valve mean gradient 10 mmHg, ascending aorta 4.1 cm. - Echo (1/23) with EF 35-40%, global hypokinesis, mildly decreased RV systolic function, normal mechanical aortic valve, moderate Adrian, PASP 51 mmHg.  - L/RHC (1/23): minimal CAD, RA mean 6, PA 35/18 (25) PCWP mean 17, preserved CO/CI 4.87/2.39, PVR 1.6 WU. 3. Atrial fibrillation: First noted in 1/09, uncertain how long it had been present (with RVR).  08/28/2016 Successful DC-CV --> NSR.  Back in atrial fibrillation at 09/18/16 office visit.  Set up for DCCV in 3/18 but back in NSR.   - Atrial fibrillation ablation in 5/18. Now off amiodarone.  4. 09/04/2016 Syncope--> over-diuresis most likely.  5. HTN 6. Ascending aortic aneurysm: 4.4 cm on 7/17 echo, 4.4 cm on 5/18 echo.  - MRA chest (4/19): 4.5 cm ascending aortic aneurysm.  - MRA chest (7/20): 4.5 cm ascending aortic aneurysm.  - MRA chest (1/22): 4.3 cm ascending aortic aneurysm (breast abnormality, recommend mammogram => consistent with benign hamartoma) - MRA chest (5/23): 4.3 cm ascending aortic aneurysm 7. Prostate  cancer: Treated with radioactive seed implantation.  8. Nephrolithiasis.  9. PFTs (4/18): Mild restriction.  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  Tobacco Use   Smoking status: Never   Smokeless tobacco: Never  Vaping Use   Vaping Use: Never used  Substance and Sexual Activity   Alcohol use: Yes    Alcohol/week: 3.0 standard drinks of alcohol    Types: 3 Cans of beer per week    Comment:  "may have 2-3 beers on the weekend"   Drug use: No   Sexual activity: Not on file  Other Topics Concern   Not on file  Social History Narrative   Not on file  Social Determinants of Health   Financial Resource Strain: Not on file  Food Insecurity: Not on file  Transportation Needs: Not on file  Physical Activity: Not on file  Stress: Not on file  Social Connections: Not on file  Intimate Partner Violence: Not on file    FH:  Family History  Problem Relation Age of Onset   Heart failure Mother    Diabetes Mother    Prostate cancer Father     Current Outpatient Medications  Medication Sig Dispense Refill   acetaminophen (TYLENOL) 500 MG tablet Take 1,000 mg by mouth every evening.     allopurinol (ZYLOPRIM) 300 MG tablet Take 300 mg by mouth in the morning.     amoxicillin (AMOXIL) 500 MG capsule Take 2,000 mg by mouth See admin instructions. Take 2000 mg 1 hour prior to dental work     aspirin 81 MG chewable tablet Chew 1 tablet (81 mg total) by mouth daily. 30 tablet 6   colchicine 0.6 MG tablet Take 0.6 mg by mouth daily as needed (for gout).      docusate sodium (COLACE) 100 MG capsule Take 100 mg by mouth 2 (two) times daily.     ENTRESTO 24-26 MG  TAKE 1 TABLET BY MOUTH TWICE A DAY 180 tablet 2   FARXIGA 10 MG TABS tablet TAKE 1 TABLET BY MOUTH DAILY BEFORE BREAKFAST. 30 tablet 6   FIBER PO Take 2 capsules by mouth 2 (two) times daily.     folic acid (FOLVITE) 1 MG tablet TAKE 1 TABLET(1 MG) BY MOUTH DAILY 90 tablet 3   metoprolol succinate (TOPROL-XL) 25 MG 24 hr tablet Take 1 tablet (25 mg total) by mouth at bedtime. 30 tablet 3   tamsulosin (FLOMAX) 0.4 MG CAPS capsule Take 0.4 mg by mouth every other day. Alternating days with tolterodine     tolterodine (DETROL LA) 4 MG 24 hr capsule Take 4 mg by mouth every other day. Alternating days with the tamsulosin     triamcinolone cream (KENALOG) 0.1 % Apply 1 Application topically 2 (two) times daily as needed for rash.     warfarin (COUMADIN) 5 MG tablet Take 0.5-1 tablets (2.5-5 mg total) by mouth See admin instructions. 1/19 and 1/20  take 7.'5mg'$  (1 and 1/2 tab) then take 1 tab ('5mg'$ ) daily until seen by MD  Take 1 tablet (5 mg) by mouth on Sundays, Mondays, Wednesdays, Thursdays & Saturdays in the evening. Take 0.5 tablet (2.5 mg) by mouth on Tuesdays & Fridays in the evening. (Patient taking differently: Take 2.5 mg by mouth every Tuesday.) 45 tablet 11   spironolactone (ALDACTONE) 25 MG tablet Take 1 tablet (25 mg total) by mouth daily. 90 tablet 3   torsemide (DEMADEX) 20 MG tablet Take 1 tablet (20 mg total) by mouth daily. Take 10 mg alternating every other day with 20 mg. 90 tablet 3   No current facility-administered medications for this encounter.   BP 100/60   Pulse 70   Wt 91.8 kg (202 lb 6.4 oz)   SpO2 96%   BMI 29.04 kg/m   Wt Readings from Last 3 Encounters:  03/29/22 91.8 kg (202 lb 6.4 oz)  02/22/22 89.8 kg (198 lb)  01/02/22 91.9 kg (202 lb 9.6 oz)   PHYSICAL EXAM: General: NAD Neck: JVP 8 cm with HJR, no thyromegaly or thyroid nodule.  Lungs: Clear to auscultation bilaterally with normal respiratory effort. CV: Nondisplaced PMI.  Heart irregular with mechanical  S2, no S3/S4, no  murmur.  1+ ankle edema.  No carotid bruit.  Normal pedal pulses.  Abdomen: Soft, nontender, no hepatosplenomegaly, no distention.  Skin: Intact without lesions or rashes.  Neurologic: Alert and oriented x 3.  Psych: Normal affect. Extremities: No clubbing or cyanosis.  HEENT: Normal.   ASSESSMENT & PLAN: 1. Chronic systolic CHF: EF on TEE in 1/18 20-25%, mechanical aortic valve ok. 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  He required milrinone to assist diuresis during 1/18-2/18 admission.  Now that he is back in NSR s/p atrial fibrillation ablation, echo in 5/18 showed EF back up to 45%.  Echo in 5/19 showed mildly lower systolic function, EF 12-87%. Echo in 12/21 showed EF up to 60-65%.  However, echo 1/23 showed EF back down to 35-40% with mild RV dysfunction.  Coronary angiography in 1/23 showed nonobstructive mild CAD.  NYHA class II symptoms, he is volume overloaded on exam.  This may be worsened by atrial flutter.  - Increase torsemide to 20 mg daily.  BMET/BNP today with BMET in 10 days.  - Continue Entresto 24/26 mg bid.      - Continue Toprol XL 25 mg daily.   - Continue Farxiga 10 mg daily.  - Increase spironolactone to 25 mg daily.  2. Mechanical aortic valve: Continue warfarin and ASA 81. Valve looked ok last echo.  3. Atrial fibrillation/atrial flutter: Admitted 1/18 with atrial fibrillation with RVR and CHF.  EF down to 20-25%.  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  Need to keep him in NSR. DCCV during 2/18 admission and now s/p atrial fibrillation ablation.  Today, he is noted to be in atrial flutter.  He has felt more fatigued recently. I suspect he will not tolerate AFL well long-term.  - I will get his INR readings from PCP and check INR today.  If INRs have been therapeutic for 1 month, we can proceed with DCCV  (check INR day of procedure).  We discussed risks/benefits and he agrees to procedure.  - He is now off amiodarone.  He has not been noted to have atrial arrhythmias since 2018, therefore will leave off amiodarone for now and proceed with DCCV as above, hopefully he will remain in NSR for a prolonged period of time as he had done previously.  - Continue warfarin. No abnormal bleeding.  4. Ascending aortic aneurysm: 4.3 cm on 5/23 MRA chest.    Followup with APP in 3 wks.   Adrian Lambert  03/29/2022

## 2022-03-30 DIAGNOSIS — M1990 Unspecified osteoarthritis, unspecified site: Secondary | ICD-10-CM | POA: Diagnosis not present

## 2022-03-30 DIAGNOSIS — M542 Cervicalgia: Secondary | ICD-10-CM | POA: Diagnosis not present

## 2022-04-04 ENCOUNTER — Telehealth (HOSPITAL_COMMUNITY): Payer: Self-pay

## 2022-04-04 DIAGNOSIS — M542 Cervicalgia: Secondary | ICD-10-CM | POA: Diagnosis not present

## 2022-04-04 DIAGNOSIS — M1990 Unspecified osteoarthritis, unspecified site: Secondary | ICD-10-CM | POA: Diagnosis not present

## 2022-04-04 NOTE — Telephone Encounter (Signed)
Left message on phone about cardioversion  scheduled for tomorrow.Phone number to call for any questions

## 2022-04-05 ENCOUNTER — Encounter (HOSPITAL_COMMUNITY): Payer: Medicare Other | Admitting: Cardiology

## 2022-04-05 ENCOUNTER — Ambulatory Visit (HOSPITAL_COMMUNITY)
Admission: RE | Admit: 2022-04-05 | Discharge: 2022-04-05 | Disposition: A | Discharge status: Home / Self Care | Payer: Medicare Other | Attending: Cardiology | Admitting: Cardiology

## 2022-04-05 ENCOUNTER — Other Ambulatory Visit: Payer: Self-pay

## 2022-04-05 ENCOUNTER — Ambulatory Visit (HOSPITAL_COMMUNITY): Payer: Medicare Other | Admitting: Anesthesiology

## 2022-04-05 ENCOUNTER — Encounter (HOSPITAL_COMMUNITY)
Admission: RE | Disposition: A | Discharge status: Home / Self Care | Payer: Self-pay | Source: Home / Self Care | Attending: Cardiology

## 2022-04-05 ENCOUNTER — Ambulatory Visit (HOSPITAL_BASED_OUTPATIENT_CLINIC_OR_DEPARTMENT_OTHER): Payer: Medicare Other | Admitting: Anesthesiology

## 2022-04-05 ENCOUNTER — Encounter (HOSPITAL_COMMUNITY): Payer: Self-pay | Admitting: Cardiology

## 2022-04-05 DIAGNOSIS — Z6829 Body mass index (BMI) 29.0-29.9, adult: Secondary | ICD-10-CM | POA: Insufficient documentation

## 2022-04-05 DIAGNOSIS — I509 Heart failure, unspecified: Secondary | ICD-10-CM

## 2022-04-05 DIAGNOSIS — Z7982 Long term (current) use of aspirin: Secondary | ICD-10-CM | POA: Diagnosis not present

## 2022-04-05 DIAGNOSIS — I4891 Unspecified atrial fibrillation: Secondary | ICD-10-CM | POA: Diagnosis not present

## 2022-04-05 DIAGNOSIS — I7121 Aneurysm of the ascending aorta, without rupture: Secondary | ICD-10-CM | POA: Diagnosis not present

## 2022-04-05 DIAGNOSIS — Z79899 Other long term (current) drug therapy: Secondary | ICD-10-CM | POA: Insufficient documentation

## 2022-04-05 DIAGNOSIS — I4892 Unspecified atrial flutter: Secondary | ICD-10-CM | POA: Insufficient documentation

## 2022-04-05 DIAGNOSIS — Z952 Presence of prosthetic heart valve: Secondary | ICD-10-CM | POA: Diagnosis not present

## 2022-04-05 DIAGNOSIS — I480 Paroxysmal atrial fibrillation: Secondary | ICD-10-CM

## 2022-04-05 DIAGNOSIS — I5022 Chronic systolic (congestive) heart failure: Secondary | ICD-10-CM | POA: Insufficient documentation

## 2022-04-05 DIAGNOSIS — I110 Hypertensive heart disease with heart failure: Secondary | ICD-10-CM | POA: Diagnosis not present

## 2022-04-05 DIAGNOSIS — M1990 Unspecified osteoarthritis, unspecified site: Secondary | ICD-10-CM | POA: Diagnosis not present

## 2022-04-05 DIAGNOSIS — I4819 Other persistent atrial fibrillation: Secondary | ICD-10-CM | POA: Diagnosis not present

## 2022-04-05 DIAGNOSIS — Z7901 Long term (current) use of anticoagulants: Secondary | ICD-10-CM | POA: Insufficient documentation

## 2022-04-05 HISTORY — PX: CARDIOVERSION: SHX1299

## 2022-04-05 LAB — POCT I-STAT, CHEM 8
BUN: 34 mg/dL — ABNORMAL HIGH (ref 8–23)
Calcium, Ion: 1.21 mmol/L (ref 1.15–1.40)
Chloride: 101 mmol/L (ref 98–111)
Creatinine, Ser: 1.3 mg/dL — ABNORMAL HIGH (ref 0.61–1.24)
Glucose, Bld: 111 mg/dL — ABNORMAL HIGH (ref 70–99)
HCT: 51 % (ref 39.0–52.0)
Hemoglobin: 17.3 g/dL — ABNORMAL HIGH (ref 13.0–17.0)
Potassium: 4.8 mmol/L (ref 3.5–5.1)
Sodium: 138 mmol/L (ref 135–145)
TCO2: 29 mmol/L (ref 22–32)

## 2022-04-05 LAB — PROTIME-INR
INR: 2.4 — ABNORMAL HIGH (ref 0.8–1.2)
Prothrombin Time: 25.7 seconds — ABNORMAL HIGH (ref 11.4–15.2)

## 2022-04-05 SURGERY — CARDIOVERSION
Anesthesia: General

## 2022-04-05 MED ORDER — PROPOFOL 10 MG/ML IV BOLUS
INTRAVENOUS | Status: DC | PRN
  Administered 2022-04-05: 60 mg via INTRAVENOUS

## 2022-04-05 MED ORDER — SODIUM CHLORIDE 0.9 % IV SOLN: INTRAVENOUS | Status: DC

## 2022-04-05 MED ORDER — LIDOCAINE 2% (20 MG/ML) 5 ML SYRINGE
INTRAMUSCULAR | Status: DC | PRN
  Administered 2022-04-05: 100 mg via INTRAVENOUS

## 2022-04-05 NOTE — Anesthesia Preprocedure Evaluation (Addendum)
Anesthesia Evaluation  Patient identified by MRN, date of birth, ID band Patient awake    Reviewed: Allergy & Precautions, NPO status , Patient's Chart, lab work & pertinent test results  History of Anesthesia Complications (+) Emergence Delirium and history of anesthetic complications  Airway Mallampati: I  TM Distance: >3 FB Neck ROM: Full    Dental no notable dental hx. (+) Teeth Intact, Dental Advisory Given   Pulmonary neg pulmonary ROS,    Pulmonary exam normal breath sounds clear to auscultation       Cardiovascular hypertension, Pt. on medications and Pt. on home beta blockers +CHF  + dysrhythmias Atrial Fibrillation  Rhythm:Regular Rate:Normal     Neuro/Psych negative neurological ROS  negative psych ROS   GI/Hepatic Neg liver ROS, GERD  ,  Endo/Other  negative endocrine ROS  Renal/GU negative Renal ROS  negative genitourinary   Musculoskeletal  (+) Arthritis , Osteoarthritis,    Abdominal   Peds negative pediatric ROS (+)  Hematology negative hematology ROS (+)   Anesthesia Other Findings   Reproductive/Obstetrics negative OB ROS                            Anesthesia Physical  Anesthesia Plan  ASA: 3  Anesthesia Plan: General   Post-op Pain Management: Minimal or no pain anticipated   Induction: Intravenous  PONV Risk Score and Plan: 1 and Propofol infusion and Treatment may vary due to age or medical condition  Airway Management Planned: Simple Face Mask, Natural Airway and Mask  Additional Equipment: None  Intra-op Plan:   Post-operative Plan: Extubation in OR  Informed Consent: I have reviewed the patients History and Physical, chart, labs and discussed the procedure including the risks, benefits and alternatives for the proposed anesthesia with the patient or authorized representative who has indicated his/her understanding and acceptance.     Dental  advisory given  Plan Discussed with: CRNA and Anesthesiologist  Anesthesia Plan Comments: ( ECHO 1/23  Left ventricular ejection fraction, by estimation, is 35 to 40%. The left ventricle has moderately decreased function. The left ventricle demonstrates global hypokinesis. Left ventricular diastolic parameters are indeterminate. 1. Right ventricular systolic function is mildly reduced. The right ventricular size is normal. There is moderately elevated pulmonary artery systolic pressure. The estimated right ventricular systolic pressure is 37.6 mmHg. 2. 3. Left atrial size was moderately dilated. The mitral valve is abnormal. Moderate mitral valve regurgitation. No evidence of mitral stenosis. 4. Mechanical aortic valve. Mean gradient 10 mmHg, no significant stenosis. No significant regurgitation. The aortic valve has been repaired/replaced. 5. Aortic dilatation noted. There is mild dilatation of the ascending aorta, measuring 41 mm. 6. 7. Right atrial size was mildly dilated. The inferior vena cava is dilated in size  CATH 1/23  .  Prox RCA lesion is 30% stenosed.  1. Mildly elevated PCWP with mild pulmonary venous hypertension.  2. Preserved cardiac output.  3. Minimal CAD.  4. Tortuous right subclavian, difficult to access RCA, consider femoral access if future coronary angiography needed.   Nonischemic cardiomyopathy.  Would keep diuretic the same as prior to procedure.  He will need to resume Lovenox bridge to therapeutic warfarin.   )        Anesthesia Quick Evaluation

## 2022-04-05 NOTE — Anesthesia Procedure Notes (Signed)
Procedure Name: General with mask airway Date/Time: 04/05/2022 12:38 PM  Performed by: Carolan Clines, CRNAPre-anesthesia Checklist: Patient identified, Emergency Drugs available, Suction available and Patient being monitored Patient Re-evaluated:Patient Re-evaluated prior to induction Oxygen Delivery Method: Ambu bag Preoxygenation: Pre-oxygenation with 100% oxygen Induction Type: IV induction Dental Injury: Teeth and Oropharynx as per pre-operative assessment

## 2022-04-05 NOTE — Discharge Instructions (Signed)

## 2022-04-05 NOTE — Transfer of Care (Signed)
Immediate Anesthesia Transfer of Care Note  Patient: SYLVANUS TELFORD  Procedure(s) Performed: CARDIOVERSION  Patient Location: Endoscopy Unit  Anesthesia Type:General  Level of Consciousness: drowsy  Airway & Oxygen Therapy: Patient Spontanous Breathing  Post-op Assessment: Report given to RN and Post -op Vital signs reviewed and stable  Post vital signs: Reviewed and stable  Last Vitals:  Vitals Value Taken Time  BP 93/66   Temp    Pulse 85   Resp 16   SpO2 97     Last Pain:  Vitals:   04/05/22 1102  TempSrc: Temporal  PainSc: 0-No pain         Complications: No notable events documented.

## 2022-04-05 NOTE — Interval H&P Note (Signed)
History and Physical Interval Note:  04/05/2022 12:39 PM  Adrian Lambert  has presented today for surgery, with the diagnosis of AFIB.  The various methods of treatment have been discussed with the patient and family. After consideration of risks, benefits and other options for treatment, the patient has consented to  Procedure(s): CARDIOVERSION (N/A) as a surgical intervention.  The patient's history has been reviewed, patient examined, no change in status, stable for surgery.  I have reviewed the patient's chart and labs.  Questions were answered to the patient's satisfaction.     [name] [employer]

## 2022-04-05 NOTE — Procedures (Signed)
Electrical Cardioversion Procedure Note Adrian Lambert [id number] [birth year]  Procedure: Electrical Cardioversion Indications:  Atrial Fibrillation.  He has been on warfarin, INR has been therapeutic for at least a month (was on Lovenox prior to that for colonoscopy).   Procedure Details Consent: Risks of procedure as well as the alternatives and risks of each were explained to the (patient/caregiver).  Consent for procedure obtained. Time Out: Verified patient identification, verified procedure, site/side was marked, verified correct patient position, special equipment/implants available, medications/allergies/relevent history reviewed, required imaging and test results available.  Performed  Patient placed on cardiac monitor, pulse oximetry, supplemental oxygen as necessary.  Sedation given:  Propofol per anesthesiology Pacer pads placed anterior and posterior chest.  Cardioverted 1 time(s).  Cardioverted at [place].  Evaluation Findings: Post procedure EKG shows: NSR Complications: None Patient did tolerate procedure well.   [name] 04/05/2022, 12:42 PM

## 2022-04-06 ENCOUNTER — Encounter (HOSPITAL_COMMUNITY): Payer: Self-pay | Admitting: Cardiology

## 2022-04-06 DIAGNOSIS — M542 Cervicalgia: Secondary | ICD-10-CM | POA: Diagnosis not present

## 2022-04-06 DIAGNOSIS — M1990 Unspecified osteoarthritis, unspecified site: Secondary | ICD-10-CM | POA: Diagnosis not present

## 2022-04-06 NOTE — Anesthesia Postprocedure Evaluation (Signed)
Anesthesia Post Note  Patient: Adrian Lambert  Procedure(s) Performed: CARDIOVERSION     Patient location during evaluation: PACU Anesthesia Type: General Level of consciousness: awake and alert Pain management: pain level controlled Vital Signs Assessment: post-procedure vital signs reviewed and stable Respiratory status: spontaneous breathing, nonlabored ventilation, respiratory function stable and patient connected to nasal cannula oxygen Cardiovascular status: blood pressure returned to baseline and stable Postop Assessment: no apparent nausea or vomiting Anesthetic complications: no   No notable events documented.  Last Vitals:  Vitals:   04/05/22 1310 04/05/22 1315  BP: 103/72 98/78  Pulse: 86 89  Resp: (!) 23 17  Temp:    SpO2: 95% 94%    Last Pain:  Vitals:   04/05/22 1315  TempSrc:   PainSc: 0-No pain                 Shyloh Derosa

## 2022-04-10 ENCOUNTER — Other Ambulatory Visit (HOSPITAL_COMMUNITY): Payer: Self-pay | Admitting: Cardiology

## 2022-04-10 DIAGNOSIS — M542 Cervicalgia: Secondary | ICD-10-CM | POA: Diagnosis not present

## 2022-04-10 DIAGNOSIS — M1990 Unspecified osteoarthritis, unspecified site: Secondary | ICD-10-CM | POA: Diagnosis not present

## 2022-04-10 DIAGNOSIS — M4692 Unspecified inflammatory spondylopathy, cervical region: Secondary | ICD-10-CM | POA: Diagnosis not present

## 2022-04-18 DIAGNOSIS — M1990 Unspecified osteoarthritis, unspecified site: Secondary | ICD-10-CM | POA: Diagnosis not present

## 2022-04-18 DIAGNOSIS — M542 Cervicalgia: Secondary | ICD-10-CM | POA: Diagnosis not present

## 2022-04-19 ENCOUNTER — Ambulatory Visit (HOSPITAL_COMMUNITY)
Admission: RE | Admit: 2022-04-19 | Discharge: 2022-04-19 | Disposition: A | Discharge status: Home / Self Care | Payer: Medicare Other | Source: Ambulatory Visit | Attending: Family Medicine | Admitting: Family Medicine

## 2022-04-19 ENCOUNTER — Encounter (HOSPITAL_COMMUNITY): Payer: Self-pay

## 2022-04-19 VITALS — BP 102/70 | HR 74 | Wt 204.4 lb

## 2022-04-19 DIAGNOSIS — I714 Abdominal aortic aneurysm, without rupture, unspecified: Secondary | ICD-10-CM | POA: Diagnosis not present

## 2022-04-19 DIAGNOSIS — I110 Hypertensive heart disease with heart failure: Secondary | ICD-10-CM | POA: Insufficient documentation

## 2022-04-19 DIAGNOSIS — R42 Dizziness and giddiness: Secondary | ICD-10-CM | POA: Diagnosis not present

## 2022-04-19 DIAGNOSIS — Z79899 Other long term (current) drug therapy: Secondary | ICD-10-CM | POA: Insufficient documentation

## 2022-04-19 DIAGNOSIS — R0600 Dyspnea, unspecified: Secondary | ICD-10-CM | POA: Diagnosis not present

## 2022-04-19 DIAGNOSIS — I2510 Atherosclerotic heart disease of native coronary artery without angina pectoris: Secondary | ICD-10-CM | POA: Insufficient documentation

## 2022-04-19 DIAGNOSIS — I5022 Chronic systolic (congestive) heart failure: Secondary | ICD-10-CM

## 2022-04-19 DIAGNOSIS — I7121 Aneurysm of the ascending aorta, without rupture: Secondary | ICD-10-CM | POA: Insufficient documentation

## 2022-04-19 DIAGNOSIS — Z7984 Long term (current) use of oral hypoglycemic drugs: Secondary | ICD-10-CM | POA: Insufficient documentation

## 2022-04-19 DIAGNOSIS — Z6829 Body mass index (BMI) 29.0-29.9, adult: Secondary | ICD-10-CM | POA: Insufficient documentation

## 2022-04-19 DIAGNOSIS — I4892 Unspecified atrial flutter: Secondary | ICD-10-CM | POA: Insufficient documentation

## 2022-04-19 DIAGNOSIS — Z7901 Long term (current) use of anticoagulants: Secondary | ICD-10-CM | POA: Insufficient documentation

## 2022-04-19 DIAGNOSIS — I4819 Other persistent atrial fibrillation: Secondary | ICD-10-CM | POA: Diagnosis not present

## 2022-04-19 LAB — BASIC METABOLIC PANEL
Anion gap: 8 (ref 5–15)
BUN: 20 mg/dL (ref 8–23)
CO2: 25 mmol/L (ref 22–32)
Calcium: 9.1 mg/dL (ref 8.9–10.3)
Chloride: 105 mmol/L (ref 98–111)
Creatinine, Ser: 1.06 mg/dL (ref 0.61–1.24)
GFR, Estimated: >60 mL/min (ref >60)
Glucose, Bld: 109 mg/dL — ABNORMAL HIGH (ref 70–99)
Potassium: 4.9 mmol/L (ref 3.5–5.1)
Sodium: 138 mmol/L (ref 135–145)

## 2022-04-19 LAB — BRAIN NATRIURETIC PEPTIDE: B Natriuretic Peptide: 495.4 pg/mL — ABNORMAL HIGH (ref 0.0–100.0)

## 2022-04-19 NOTE — Progress Notes (Signed)
PCP: Lawerance Cruel, MD Cardiology: Dr. Martinique  HF Cardiology: Dr. Aundra Dubin   HPI: [name] is a 81 y.o. male with a history of severe AS s/p St Jude mechanical AVR in 03/2003 on chronic coumadin, HTN, obesity, and mild LV dysfxn  EF 40-45% on 01/2016 echo.  Prior to admit in 1/18, he had a cough and dyspnea that was thought to be bronchitis so he was treated with abx and oral steroids. He was seen in cardiology office in 1/18 and was noted to be in rapid atrial fibrillation with significant volume overload.   Admitted 1/23 through 08/29/16 with marked volume overload, lower extremity edema, and atrial fibrillation with RVR.  Initial cardioversion failed and he was difficult to diurese. He was placed on milrinone for low output and diuresed with IV lasix with good response.  Weight came down considerably. Amiodarone was begun. Once fully diuresed, he had successful DC-CV on 08/28/16. Discharged on torsemide 60 mg daily. Discharge weight 212 pounds. Echo and TEE in 1/18 had shown fall in EF to around 25%.   He continued to lose weight as an outpatient.  He had a syncopal episode where he stood up from sitting and got lightheaded with transient loss of consciousness.  He had been having orthostatic symptoms.  We saw him in the office and cut back on torsemide from 60 mg daily down eventually to 20 mg daily.  Creatinine up some to 1.7 at that time.    At a prior appt, he was back in atrial fibrillation.  He was set up for DCCV in 3/18, but was in NSR when he arrived to short stay. He had atrial fibrillation ablation with Dr. Curt Bears in 5/18.   Echo was done in 5/18, EF up to 45% with mild diffuse hypokinesis, normal mechanical aortic valve, mildly decreased RV systolic function.    Echo was repeated in 5/19, EF 35-40%, diffuse hypokinesis, mildly decreased RV systolic function, stable mechanical aortic valve.   Echo in 11/21 showed EF up to 50-55%, normal RV size and systolic function, mechanical aortic  valve looks ok, normal IVC, ascending aorta 4.2 cm. Echo in 12/21 showed EF 60-65%, mild RV enlargement, normal RV function, mechanical aortic valve mean gradient 10 mmHg, ascending aorta 4.1 cm.  MRA chest in 1/22 showed 4.3 cm ascending aorta.   Echo 1/23 showed EF 35-40%, global hypokinesis, mildly decreased RV systolic function, normal mechanical aortic valve, moderate MR, PASP 51 mmHg. With EF back down and mild volume overload, he was started on Farxiga, torsemide increased and R/LHC arranged.  R/LHC (1/23) showed mild elevated PCWP with mild pulmonary venous hypertension, preserved CO and minimal CAD.  Follow up 9/23, found to be in AFL with increased fatige and mild volume overload. Arlyce Harman increased to 25 mg daily and started on torsemide 20 mg daily. Arranged for DCCV, s/p DCCV 04/05/22 to NSR.  Today he returns for post cardioversion HF follow up. Overall feeling fine. Feels energy has improved since cardioversion. He has dyspnea waking up steps. Occasional positional dizziness but no falls. Denies CP, palpitations, edema, or PND/Orthopnea. Appetite ok. No fever or chills. Weight at home 200 pounds. Taking all medications.   ECG (personally reviewed): NSR 1AVB iRBBB 73 bpm  Labs (09/04/2016):  Hgb 15.9, K 5.0, Creatinine 1.77, digoxin 0.7, BNP 326  Labs (3/18): digoxin 0.8, K 4.8, creatinine 1.44, TSH normal, AST 54, ALT 49 Labs (4/18): K 4.3, creatinine 1.3 Labs (7/18): K 3.8, creatinine 0.9, hgb 11.9 Labs (11/18):  K 4.9, creatinine 1.0, hgb 13.5 Labs (3/19): K 4.7,creatinine 0.99 Labs (5/19): K 4.6, creatinine 0.98 Labs (9/19): K 4.2, creatinine 0.91 Labs (12/19): K 4.6, creatinine 1.14 Labs (3/20): K 4.6, creatinine 0.98 Labs (7/20): creatinine 1.07 Labs (9/21): K 5, creatinine 1.13 Labs (12/21): LDL 77, K 4.1, creatinine 0.97 Labs (6/22): K 4.8, creatinine 1.02 Labs (10/22): K 4.9, creatinine 1.02 Labs (11/22): K 4.9, creatinine 1.02 Labs (1/23): K 4.5, creatinine 1.18 Labs  (2/23): K 4.8, creatinine 1.29 Labs (5/23): LDL 92 Labs (6/23): K 4.7, creatinine 1.16, BNP 521 Labs (9/23): K 4.7, creatinine 1.24  PMH: 1. Aortic stenosis: # 25 St Jude mechanical aortic valve in 9/14.   2. Chronic systolic CHF: Echo in 4/33 with EF 45-50%.  Admitted in 1/18 with afib with RVR, ?tachycardia-mediated cardiomyopathy.  - Echo (1/18) with EF 25-30%, stable mechanical aortic valve.  - TEE (1/18) with EF 20-25%, stable mechanical aortic valve, moderate MR, small PFO.  - Admission 1/18 with acute on chronic systolic CHF, required milrinone, extensively diuresed.  - Echo (5/18) with EF 45%, diffuse hypokinesis, normal RV size with mildly decreased systolic function, mechanical aortic valve with mean gradient 14 mmHg, ascending aorta 4.4 cm.  - Echo (5/19) with EF 35-40%, diffuse hypokinesis, mild LVH, mildly decreased RV systolic function, mechanical aortic valve looked ok.   - Echo (11/20) with EF up to 50-55%, normal RV size and systolic function, mechanical aortic valve looks ok, normal IVC, ascending aorta 4.2 cm.  - Echo (12/21) with EF 60-65%, mild RV enlargement, normal RV function, mechanical aortic valve mean gradient 10 mmHg, ascending aorta 4.1 cm. - Echo (1/23) with EF 35-40%, global hypokinesis, mildly decreased RV systolic function, normal mechanical aortic valve, moderate MR, PASP 51 mmHg.  - L/RHC (1/23): minimal CAD, RA mean 6, PA 35/18 (25) PCWP mean 17, preserved CO/CI 4.87/2.39, PVR 1.6 WU. 3. Atrial fibrillation: First noted in 2/95, uncertain how long it had been present (with RVR).  08/28/2016 Successful DC-CV --> NSR.  Back in atrial fibrillation at 09/18/16 office visit.  Set up for DCCV in 3/18 but back in NSR.   - Atrial fibrillation ablation in 5/18. Now off amiodarone.  - s/p DCCV for AFL--> NSR (9/23) 4. 09/04/2016 Syncope--> over-diuresis most likely.  5. HTN 6. Ascending aortic aneurysm: 4.4 cm on 7/17 echo, 4.4 cm on 5/18 echo.  - MRA chest (4/19): 4.5  cm ascending aortic aneurysm.  - MRA chest (7/20): 4.5 cm ascending aortic aneurysm.  - MRA chest (1/22): 4.3 cm ascending aortic aneurysm (breast abnormality, recommend mammogram => consistent with benign hamartoma) - MRA chest (5/23): 4.3 cm ascending aortic aneurysm 7. Prostate cancer: Treated with radioactive seed implantation.  8. Nephrolithiasis.  9. PFTs (4/18): Mild restriction.  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  Tobacco Use   Smoking status: Never   Smokeless tobacco: Never  Vaping Use   Vaping Use: Never used  Substance and Sexual Activity   Alcohol use: Yes    Alcohol/week: 3.0 standard drinks of alcohol    Types: 3 Cans of beer per week  Comment:  "may have 2-3 beers on the weekend"   Drug use: No   Sexual activity: Not on file  Other Topics Concern   Not on file  Social History Narrative   Not on file   Social Determinants of Health   Financial Resource Strain: Not on file  Food Insecurity: Not on file  Transportation Needs: Not on file  Physical Activity: Not on file  Stress: Not on file  Social Connections: Not on file  Intimate Partner Violence: Not on file    FH:  Family History  Problem Relation Age of Onset   Heart failure Mother    Diabetes Mother    Prostate cancer Father     Current Outpatient Medications  Medication Sig Dispense Refill   acetaminophen (TYLENOL) 500 MG tablet Take 1,000 mg by mouth every evening.     allopurinol (ZYLOPRIM) 300 MG tablet Take 300 mg by mouth in the morning.     amoxicillin (AMOXIL) 500 MG capsule Take 2,000 mg by mouth See admin instructions. Take 2000 mg 1 hour prior to dental  work     aspirin 81 MG chewable tablet Chew 1 tablet (81 mg total) by mouth daily. 30 tablet 6   colchicine 0.6 MG tablet Take 0.6 mg by mouth daily as needed (for gout).      docusate sodium (COLACE) 100 MG capsule Take 100 mg by mouth 2 (two) times daily.     ENTRESTO 24-26 MG TAKE 1 TABLET BY MOUTH TWICE A DAY 180 tablet 2   FARXIGA 10 MG TABS tablet TAKE 1 TABLET BY MOUTH DAILY BEFORE BREAKFAST. 30 tablet 6   FIBER PO Take 2 capsules by mouth 2 (two) times daily.     folic acid (FOLVITE) 1 MG tablet TAKE 1 TABLET(1 MG) BY MOUTH DAILY 90 tablet 3   metoprolol succinate (TOPROL-XL) 25 MG 24 hr tablet Take 1 tablet (25 mg total) by mouth at bedtime. 30 tablet 3   spironolactone (ALDACTONE) 25 MG tablet Take 1 tablet (25 mg total) by mouth daily. 90 tablet 3   tamsulosin (FLOMAX) 0.4 MG CAPS capsule Take 0.4 mg by mouth every other day. Alternating days with tolterodine     tolterodine (DETROL LA) 4 MG 24 hr capsule Take 4 mg by mouth every other day. Alternating days with the tamsulosin     torsemide (DEMADEX) 20 MG tablet Patient takes 20 mg by mouth every other day alternating 10 mg daily.     triamcinolone cream (KENALOG) 0.1 % Apply 1 Application topically 2 (two) times daily as needed for rash.     warfarin (COUMADIN) 5 MG tablet Take 0.5-1 tablets (2.5-5 mg total) by mouth See admin instructions. 1/19 and 1/20  take 7.'5mg'$  (1 and 1/2 tab) then take 1 tab ('5mg'$ ) daily until seen by MD  Take 1 tablet (5 mg) by mouth on Sundays, Mondays, Wednesdays, Thursdays & Saturdays in the evening. Take 0.5 tablet (2.5 mg) by mouth on Tuesdays & Fridays in the evening. (Patient taking differently: Take 2.5-5 mg by mouth See admin instructions. Take 2.5 mg on Monday and Friday, All the other days take 5 mg in the evening) 45 tablet 11   No current facility-administered medications for this encounter.   BP 102/70   Pulse 74   Wt 92.7 kg (204 lb 6.4 oz)   SpO2 98%   BMI 29.33 kg/m   Wt Readings from  Last 3 Encounters:  04/19/22 92.7 kg (204 lb 6.4 oz)  03/29/22 91.8 kg (202 lb 6.4 oz)  02/22/22 89.8 kg (198 lb)   PHYSICAL EXAM: General:  NAD. No resp difficulty, walked into clinic HEENT: Normal Neck: Supple. No JVD. Carotids 2+ bilat; no bruits. No lymphadenopathy or thryomegaly appreciated. Cor: PMI nondisplaced. Regular rate & rhythm. No rubs, gallops or murmurs. + mechanical S2 Lungs: Clear Abdomen: Soft, nontender, nondistended. No hepatosplenomegaly. No bruits or masses. Good bowel sounds. Extremities: No cyanosis, clubbing, rash, edema Neuro: Alert & oriented x 3, cranial nerves grossly intact. Moves all 4 extremities w/o difficulty. Affect pleasant.  ASSESSMENT & PLAN: 1. Chronic systolic CHF: EF on TEE in 1/18 20-25%, mechanical aortic valve ok. 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  He required milrinone to assist diuresis during 1/18-2/18 admission.  Now that he is back in NSR s/p atrial fibrillation ablation, echo in 5/18 showed EF back up to 45%.  Echo in 5/19 showed mildly lower systolic function, EF 16-10%. Echo in 12/21 showed EF up to 60-65%.  However, echo 1/23 showed EF back down to 35-40% with mild RV dysfunction.  Coronary angiography in 1/23 showed nonobstructive mild CAD.  NYHA class II symptoms, he is not volume overloaded on exam.   - Continue torsemide 20 mg daily alternating with 10 mg every other day.  BMET/BNP today. - Continue Entresto 24/26 mg bid.      - Continue Toprol XL 25 mg qhs.   - Continue Farxiga 10 mg daily.  - Continue spironolactone 25 mg daily.  2. Mechanical aortic valve: Continue warfarin and ASA 81. Valve looked ok last echo.  3. Atrial fibrillation/atrial flutter: Admitted 1/18 with atrial fibrillation with RVR and CHF.  EF down to 20-25%.  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  Need to keep  him in NSR. DCCV during 2/18 admission and now s/p atrial fibrillation ablation.  In AFL at 9/23 follow up. Arranged for DCCV (04/05/22) to NSR. He is NSR on ECG today. - He is now off amiodarone.  He has not been noted to have atrial arrhythmias since 2018, therefore will leave off amiodarone for now. Hopefully he will remain in NSR for a prolonged period of time as he had done previously.  - Continue warfarin. No abnormal bleeding.  4. Ascending aortic aneurysm: 4.3 cm on 5/23 MRA chest.    Follow up withDr. Aundra Dubin in 3-4 months.  Maricela Bo [facility] FNP-BC 04/19/2022

## 2022-04-19 NOTE — Patient Instructions (Signed)
EKG done today.  Labs done today. We will contact you only if your labs are abnormal.  No medication changes were made. Please continue all current medications as prescribed.  Your physician recommends that you schedule a follow-up appointment in: 3 months with Dr. Aundra Dubin. Please contact our office in November to schedule a December appointment.   If you have any questions or concerns before your next appointment please send [place] a message through [place] or call our office at [phone].    TO LEAVE A MESSAGE FOR THE NURSE SELECT OPTION 2, PLEASE LEAVE A MESSAGE INCLUDING: YOUR NAME DATE OF BIRTH CALL BACK NUMBER REASON FOR CALL**this is important as we prioritize the call backs  YOU WILL RECEIVE A CALL BACK THE SAME DAY AS LONG AS YOU CALL BEFORE 4:00 PM   Do the following things EVERYDAY: Weigh yourself in the morning before breakfast. Write it down and keep it in a log. Take your medicines as prescribed Eat low salt foods--Limit salt (sodium) to 2000 mg per day.  Stay as active as you can everyday Limit all fluids for the day to less than 2 liters   At the [facility], you and your health needs are our priority. As part of our continuing mission to provide you with exceptional heart care, we have created designated Provider Care Teams. These Care Teams include your primary Cardiologist (physician) and Advanced Practice Providers (APPs- Physician Assistants and Nurse Practitioners) who all work together to provide you with the care you need, when you need it.   You may see any of the following providers on your designated Care Team at your next follow up: Dr Glori Bickers Dr Haynes Kerns, NP Lyda Jester, [place] Audry Riles, PharmD   Please be sure to bring in all your medications bottles to every appointment.

## 2022-05-08 DIAGNOSIS — M47812 Spondylosis without myelopathy or radiculopathy, cervical region: Secondary | ICD-10-CM | POA: Diagnosis not present

## 2022-05-15 ENCOUNTER — Other Ambulatory Visit (HOSPITAL_BASED_OUTPATIENT_CLINIC_OR_DEPARTMENT_OTHER): Payer: Self-pay

## 2022-05-15 MED ORDER — COMIRNATY 30 MCG/0.3ML IM SUSY
PREFILLED_SYRINGE | INTRAMUSCULAR | 0 refills | Status: DC
  Filled 2022-05-15: qty 0.3, 1d supply, fill #0

## 2022-05-17 DIAGNOSIS — C61 Malignant neoplasm of prostate: Secondary | ICD-10-CM | POA: Diagnosis not present

## 2022-05-19 ENCOUNTER — Other Ambulatory Visit (HOSPITAL_COMMUNITY): Payer: Self-pay | Admitting: Cardiology

## 2022-05-22 ENCOUNTER — Other Ambulatory Visit (HOSPITAL_COMMUNITY): Payer: Self-pay

## 2022-05-22 MED ORDER — METOPROLOL SUCCINATE ER 25 MG PO TB24: 25.0000 mg | ORAL_TABLET | Freq: Every day | ORAL | 3 refills | Status: DC

## 2022-05-23 DIAGNOSIS — Z8739 Personal history of other diseases of the musculoskeletal system and connective tissue: Secondary | ICD-10-CM | POA: Diagnosis not present

## 2022-05-23 DIAGNOSIS — E782 Mixed hyperlipidemia: Secondary | ICD-10-CM | POA: Diagnosis not present

## 2022-05-23 DIAGNOSIS — I10 Essential (primary) hypertension: Secondary | ICD-10-CM | POA: Diagnosis not present

## 2022-05-23 DIAGNOSIS — R7301 Impaired fasting glucose: Secondary | ICD-10-CM | POA: Diagnosis not present

## 2022-05-26 DIAGNOSIS — R3121 Asymptomatic microscopic hematuria: Secondary | ICD-10-CM | POA: Diagnosis not present

## 2022-05-26 DIAGNOSIS — N401 Enlarged prostate with lower urinary tract symptoms: Secondary | ICD-10-CM | POA: Diagnosis not present

## 2022-05-26 DIAGNOSIS — C61 Malignant neoplasm of prostate: Secondary | ICD-10-CM | POA: Diagnosis not present

## 2022-05-26 DIAGNOSIS — N3281 Overactive bladder: Secondary | ICD-10-CM | POA: Diagnosis not present

## 2022-05-30 DIAGNOSIS — M47812 Spondylosis without myelopathy or radiculopathy, cervical region: Secondary | ICD-10-CM | POA: Diagnosis not present

## 2022-05-31 DIAGNOSIS — E669 Obesity, unspecified: Secondary | ICD-10-CM | POA: Diagnosis not present

## 2022-05-31 DIAGNOSIS — I10 Essential (primary) hypertension: Secondary | ICD-10-CM | POA: Diagnosis not present

## 2022-05-31 DIAGNOSIS — R7301 Impaired fasting glucose: Secondary | ICD-10-CM | POA: Diagnosis not present

## 2022-05-31 DIAGNOSIS — Z952 Presence of prosthetic heart valve: Secondary | ICD-10-CM | POA: Diagnosis not present

## 2022-05-31 DIAGNOSIS — Z0000 Encounter for general adult medical examination without abnormal findings: Secondary | ICD-10-CM | POA: Diagnosis not present

## 2022-05-31 DIAGNOSIS — Z23 Encounter for immunization: Secondary | ICD-10-CM | POA: Diagnosis not present

## 2022-06-01 ENCOUNTER — Encounter: Payer: Self-pay | Admitting: Urology

## 2022-06-27 DIAGNOSIS — M47812 Spondylosis without myelopathy or radiculopathy, cervical region: Secondary | ICD-10-CM | POA: Diagnosis not present

## 2022-07-05 ENCOUNTER — Ambulatory Visit: Payer: Medicare Other | Admitting: Urology

## 2022-07-05 ENCOUNTER — Encounter: Payer: Self-pay | Admitting: Urology

## 2022-07-05 VITALS — BP 113/73 | HR 96 | Ht 71.0 in | Wt 188.0 lb

## 2022-07-05 DIAGNOSIS — N401 Enlarged prostate with lower urinary tract symptoms: Secondary | ICD-10-CM | POA: Diagnosis not present

## 2022-07-05 DIAGNOSIS — R3129 Other microscopic hematuria: Secondary | ICD-10-CM | POA: Diagnosis not present

## 2022-07-05 DIAGNOSIS — N138 Other obstructive and reflux uropathy: Secondary | ICD-10-CM

## 2022-07-05 DIAGNOSIS — N3281 Overactive bladder: Secondary | ICD-10-CM | POA: Diagnosis not present

## 2022-07-05 DIAGNOSIS — C61 Malignant neoplasm of prostate: Secondary | ICD-10-CM

## 2022-07-05 LAB — URINALYSIS
Bilirubin, UA: NEGATIVE
Ketones, UA: NEGATIVE
Leukocytes, UA: NEGATIVE
Nitrite, UA: NEGATIVE
Protein, UA: NEGATIVE
Spec Grav, UA: 1.01 (ref 1.010–1.025)
Urobilinogen, UA: 0.2 E.U./dL
pH, UA: 5.5 (ref 5.0–8.0)

## 2022-07-05 NOTE — Progress Notes (Signed)
Assessment: 1. Prostate cancer [facility]); s/p LDR brachytherapy 6/14   2. BPH with obstruction/lower urinary tract symptoms   3. OAB (overactive bladder)     Plan: I reviewed the patient records from [facility] including office notes and lab results. Microscopic U/A sent today Continue tamsulosin and tolterodine Return to office in 1 year with PSA  Chief Complaint:  Chief Complaint  Patient presents with   Prostate Cancer    History of Present Illness:  Adrian Lambert is a 81 y.o. male who is seen in consultation from Franchot Gallo, MD for evaluation of prostate cancer and overactive bladder. He was originally diagnosed with prostate cancer in March 2014.  Prostate volume measured 49 cc.  3/12 biopsies were positive for adenocarcinoma with Gleason 3+3 and Gleason 3+4 adenocarcinoma.  He underwent I-125 brachytherapy on 01/09/2013.  He has had an excellent PSA response.  His most recent PSA from 05/26/2022 was 0.056.  He has a history of lower urinary tract symptoms.  He has been managed with tamsulosin and tolterodine. At the time of his last visit with Dr. Diona Fanti on 05/26/2022, he was found to have 3-10 RBCs on microscopic urinalysis.  He presents today to establish care.  He continues on tamsulosin and tolterodine.  His urinary symptoms are stable.  He has urgency and nocturia x 2.  No dysuria or gross hematuria. IPSS = 4 today. He reports that a repeat urinalysis done earlier this week showed no evidence of microscopic hematuria. U/A from 12/23:  0-2 RBCs.  Past Medical History:  Past Medical History:  Diagnosis Date   Anticoagulant long-term use    Arthritis    "probably in my thumbs" (06/26/2012)   Bifascicular block    Chronic combined systolic and diastolic CHF (congestive heart failure) ([place])    a. 01/2016 Echo: EF 45-50%, gr2 DD, inflat AK (poor acoustic windows);  b. 07/2016 Echo: EF 25-30%, mild LVH, PASP 63mHg (pt in Afib).   Complication of anesthesia     "wake up w/a start; hallucinations" (06/26/2012)   Critical Aortic Stenosis    a. 03/2003 s/p SJM #25 mech prosthetic AoV;  b. 07/2016 Echo: EF 25-30% (in setting of AF), AoV area 1.72 cm^2 (VTI), 1.75 cm^2 (Vmean).   Diverticulosis    Gouty arthritis    "have had it in both feet, ankles, right knee" (06/26/2012)   History of diverticulitis of colon    History of kidney stones    History of pancreatitis    Hypertension    Incomplete RBBB    [place]mal [place]) [place]    a. CHA2DS2VASc = 4-->chronic coumadin in setting of mech AoV;  b. 07/2016 Recurrent AF RVR.    Past Surgical History:  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  DR GERHARDT   #236mST JUDE MECHNICAL PROSTHESIS   ATRIAL FIBRILLATION ABLATION N/A 11/28/2016   Procedure: Atrial Fibrillation Ablation;  Surgeon: CaConstance HawMD;  [facility];  Service: Cardiovascular;  Laterality: N/A;   CARDIOVERSION N/A 08/18/2016   Procedure: CARDIOVERSION;  Surgeon: PhThayer HeadingsMD;  [facility];  Service: Cardiovascular;  Laterality: N/A;   CARDIOVERSION N/A 08/28/2016   Procedure: CARDIOVERSION;  Surgeon: DaLarey DresserMD;  [facility] Service: Cardiovascular;  Laterality: N/A;   CARDIOVERSION N/A 04/05/2022   Procedure: CARDIOVERSION;  Surgeon: McLarey DresserMD;  [facility];  Service: Cardiovascular;  Laterality: N/A;  CATARACT EXTRACTION W/ INTRAOCULAR LENS  IMPLANT, BILATERAL  RIGHT 2010/  LEFT DEC 2013   COLONOSCOPY WITH PROPOFOL N/A 02/22/2022   Procedure: COLONOSCOPY WITH PROPOFOL;  Surgeon: Clarene Essex, MD;  Location: [facility] ENDOSCOPY;  Service: Gastroenterology;  Laterality: N/A;   HEMICOLECTOMY  10/06/2003   Laparoscopic-assisted left hemicolectomy for diverticulitis   LAPAROSCOPIC CHOLECYSTECTOMY  07-23-2006   POLYPECTOMY  02/22/2022   Procedure: POLYPECTOMY;  Surgeon: Clarene Essex, MD;  Location: [facility]  ENDOSCOPY;  Service: Gastroenterology;;   PROSTATE BIOPSY  10/09/2012   gleason 3+4=7   RADIOACTIVE SEED IMPLANT N/A 01/09/2013   Procedure: RADIOACTIVE SEED IMPLANT;  Surgeon: Franchot Gallo, MD;  Location[facility];  Service: Urology;  Laterality: N/A;   RIGHT/LEFT HEART CATH AND CORONARY ANGIOGRAPHY N/A 08/11/2021   Procedure: RIGHT/LEFT HEART CATH AND CORONARY ANGIOGRAPHY;  Surgeon: Larey Dresser, MD;  Location: [place] [facility] LAB;  Service: Cardiovascular;  Laterality: N/A;   TEE WITHOUT CARDIOVERSION N/A 08/18/2016   Procedure: TRANSESOPHAGEAL ECHOCARDIOGRAM (TEE);  Surgeon: Thayer Headings, MD;  Location: [place];  Service: Cardiovascular;  Laterality: N/A;   TRANSTHORACIC ECHOCARDIOGRAM  04-28-2008   MILD LVH / NORMAL LSF/ NORMAL AORTIC VALVE MECHANICAL PROSTHESIS FUNCTION/ MILD LAE/ EF 55-60%    Allergies:  Allergies  Allergen Reactions   Celecoxib Rash and Other (See Comments)    Celebrex    Family History:  Family History  Problem Relation Age of Onset   Heart failure Mother    Diabetes Mother    Prostate cancer Father     Social History:  Social History   Tobacco Use   Smoking status: Never   Smokeless tobacco: Never  Vaping Use   Vaping Use: Never used  Substance Use Topics   Alcohol use: Yes    Alcohol/week: 3.0 standard drinks of alcohol    Types: 3 Cans of beer per week    Comment:  "may have 2-3 beers on the weekend"   Drug use: No    Review of symptoms:  Constitutional:  Negative for unexplained weight loss, night sweats, fever, chills ENT:  Negative for nose bleeds, sinus pain, painful swallowing CV:  Negative for chest pain, shortness of breath, exercise intolerance, palpitations, loss of consciousness Resp:  Negative for cough, wheezing, shortness of breath GI:  Negative for nausea, vomiting, diarrhea, bloody stools GU:  Positives noted in HPI; otherwise negative for gross hematuria, dysuria, urinary incontinence Neuro:   Negative for seizures, poor balance, limb weakness, slurred speech Psych:  Negative for lack of energy, depression, anxiety Endocrine:  Negative for polydipsia, polyuria, symptoms of hypoglycemia (dizziness, hunger, sweating) Hematologic:  Negative for anemia, purpura, petechia, prolonged or excessive bleeding, use of anticoagulants  Allergic:  Negative for difficulty breathing or choking as a result of exposure to anything; no shellfish allergy; no allergic response (rash/itch) to materials, foods  Physical exam: BP 113/73   Pulse 96   Ht '5\' 11"'$  (1.803 m)   Wt 188 lb (85.3 kg)   BMI 26.22 kg/m  GENERAL APPEARANCE:  Well appearing, well developed, well nourished, NAD HEENT: Atraumatic, Normocephalic, oropharynx clear. NECK: Supple without lymphadenopathy or thyromegaly. LUNGS: Clear to auscultation bilaterally. HEART: Regular Rate and Rhythm without murmurs, gallops, or rubs. ABDOMEN: Soft, non-tender, No Masses. EXTREMITIES: Moves all extremities well.  Without clubbing, cyanosis, or edema. NEUROLOGIC:  Alert and oriented x 3, normal gait, CN II-XII grossly intact.  MENTAL STATUS:  Appropriate. BACK:  Non-tender to palpation.  No CVAT SKIN:  Warm, dry and  intact.    Results: U/A: dipstick 3+ glucose, trace blood

## 2022-07-06 LAB — URINALYSIS, MICROSCOPIC ONLY
Bacteria, UA: NONE SEEN
Casts: NONE SEEN /lpf
Epithelial Cells (non renal): NONE SEEN /hpf (ref 0–10)
RBC, Urine: NONE SEEN /hpf (ref 0–2)
WBC, UA: NONE SEEN /hpf (ref 0–5)

## 2022-07-10 ENCOUNTER — Other Ambulatory Visit: Payer: Self-pay

## 2022-07-10 ENCOUNTER — Emergency Department (HOSPITAL_BASED_OUTPATIENT_CLINIC_OR_DEPARTMENT_OTHER): Payer: Medicare Other

## 2022-07-10 ENCOUNTER — Encounter (HOSPITAL_BASED_OUTPATIENT_CLINIC_OR_DEPARTMENT_OTHER): Payer: Self-pay | Admitting: Urology

## 2022-07-10 DIAGNOSIS — S80211A Abrasion, right knee, initial encounter: Secondary | ICD-10-CM | POA: Diagnosis not present

## 2022-07-10 DIAGNOSIS — Z23 Encounter for immunization: Secondary | ICD-10-CM | POA: Insufficient documentation

## 2022-07-10 DIAGNOSIS — S4991XA Unspecified injury of right shoulder and upper arm, initial encounter: Secondary | ICD-10-CM | POA: Insufficient documentation

## 2022-07-10 DIAGNOSIS — I5042 Chronic combined systolic (congestive) and diastolic (congestive) heart failure: Secondary | ICD-10-CM | POA: Insufficient documentation

## 2022-07-10 DIAGNOSIS — Z7901 Long term (current) use of anticoagulants: Secondary | ICD-10-CM | POA: Diagnosis not present

## 2022-07-10 DIAGNOSIS — Z7982 Long term (current) use of aspirin: Secondary | ICD-10-CM | POA: Insufficient documentation

## 2022-07-10 DIAGNOSIS — I110 Hypertensive heart disease with heart failure: Secondary | ICD-10-CM | POA: Diagnosis not present

## 2022-07-10 DIAGNOSIS — W108XXA Fall (on) (from) other stairs and steps, initial encounter: Secondary | ICD-10-CM | POA: Insufficient documentation

## 2022-07-10 DIAGNOSIS — Z79899 Other long term (current) drug therapy: Secondary | ICD-10-CM | POA: Diagnosis not present

## 2022-07-10 DIAGNOSIS — Z043 Encounter for examination and observation following other accident: Secondary | ICD-10-CM | POA: Diagnosis not present

## 2022-07-10 DIAGNOSIS — M25561 Pain in right knee: Secondary | ICD-10-CM | POA: Diagnosis not present

## 2022-07-10 NOTE — ED Triage Notes (Signed)
Per pt fell down 2 steps on porch  C/o right knee and right shoulder pain

## 2022-07-11 ENCOUNTER — Emergency Department (HOSPITAL_BASED_OUTPATIENT_CLINIC_OR_DEPARTMENT_OTHER)
Admission: EM | Admit: 2022-07-11 | Discharge: 2022-07-11 | Disposition: A | Discharge status: Home / Self Care | Payer: Medicare Other | Attending: Emergency Medicine | Admitting: Emergency Medicine

## 2022-07-11 DIAGNOSIS — S4991XA Unspecified injury of right shoulder and upper arm, initial encounter: Secondary | ICD-10-CM | POA: Diagnosis not present

## 2022-07-11 DIAGNOSIS — S80211A Abrasion, right knee, initial encounter: Secondary | ICD-10-CM

## 2022-07-11 DIAGNOSIS — W108XXA Fall (on) (from) other stairs and steps, initial encounter: Secondary | ICD-10-CM

## 2022-07-11 MED ORDER — TETANUS-DIPHTH-ACELL PERTUSSIS 5-2.5-18.5 LF-MCG/0.5 IM SUSY
0.5000 mL | PREFILLED_SYRINGE | Freq: Once | INTRAMUSCULAR | Status: AC
  Administered 2022-07-11: 0.5 mL via INTRAMUSCULAR
  Filled 2022-07-11: qty 0.5

## 2022-07-11 MED ORDER — HYDROCODONE-ACETAMINOPHEN 5-325 MG PO TABS: 1.0000 | ORAL_TABLET | Freq: Four times a day (QID) | ORAL | 0 refills | Status: DC | PRN

## 2022-07-11 MED ORDER — HYDROCODONE-ACETAMINOPHEN 5-325 MG PO TABS
1.0000 | ORAL_TABLET | Freq: Once | ORAL | Status: AC
  Administered 2022-07-11: 1 via ORAL
  Filled 2022-07-11: qty 1

## 2022-07-11 NOTE — ED Provider Notes (Signed)
[facility] Provider Note: Georgena Spurling, MD, FACEP  CSN: [id number] MRN: [id number] ARRIVAL: 07/10/22 at [place]   HISTORY OF PRESENT ILLNESS  07/11/22 3:11 AM Adrian Lambert is a 81 y.o. male fell down 2 steps on his porch just prior to arrival.  He struck his right knee and his right shoulder and is having pain in both.  The pain in the shoulder is the more severe of the two; he rates his pain as an 8 out of 10, worse with attempted movement.  Range of motion of the right shoulder is very limited especially in abduction.  His knee injury is primarily an abrasion.   Past Medical History:  Diagnosis Date   Anticoagulant long-term use    Arthritis    "probably in my thumbs" (06/26/2012)   Bifascicular block    Chronic combined systolic and diastolic CHF (congestive heart failure) ([place])    a. 01/2016 Echo: EF 45-50%, gr2 DD, inflat AK (poor acoustic windows);  b. 07/2016 Echo: EF 25-30%, mild LVH, PASP 88mHg (pt in Afib).   Complication of anesthesia    "wake up w/a start; hallucinations" (06/26/2012)   Critical Aortic Stenosis    a. 03/2003 s/p SJM #25 mech prosthetic AoV;  b. 07/2016 Echo: EF 25-30% (in setting of AF), AoV area 1.72 cm^2 (VTI), 1.75 cm^2 (Vmean).   Diverticulosis    Gouty arthritis    "have had it in both feet, ankles, right knee" (06/26/2012)   History of diverticulitis of colon    History of kidney stones    History of pancreatitis    Hypertension    Incomplete RBBB    [place]    a. CHA2DS2VASc = 4-->chronic coumadin in setting of mech AoV;  b. 07/2016 Recurrent AF RVR.    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  DR GERHARDT   #238mST JUDE MECHNICAL PROSTHESIS   ATRIAL FIBRILLATION ABLATION N/A 11/28/2016   Procedure: Atrial Fibrillation Ablation;  Surgeon: CaConstance HawMD;  [facility];  Service:  Cardiovascular;  Laterality: N/A;   CARDIOVERSION N/A 08/18/2016   Procedure: CARDIOVERSION;  Surgeon: PhThayer HeadingsMD;  [facility];  Service: Cardiovascular;  Laterality: N/A;   CARDIOVERSION N/A 08/28/2016   Procedure: CARDIOVERSION;  Surgeon: DaLarey DresserMD;  [facility];  Service: Cardiovascular;  Laterality: N/A;   CARDIOVERSION N/A 04/05/2022   Procedure: CARDIOVERSION;  Surgeon: McLarey DresserMD;  [facility];  Service: Cardiovascular;  Laterality: N/A;   CATARACT EXTRACTION W/ INTRAOCULAR LENS  IMPLANT, BILATERAL  RIGHT 2010/  LEFT DEC 2013   COLONOSCOPY WITH PROPOFOL N/A 02/22/2022   Procedure: COLONOSCOPY WITH PROPOFOL;  Surgeon: MaClarene EssexMD;  [facility];  Service: Gastroenterology;  Laterality: N/A;   HEMICOLECTOMY  10/06/2003   Laparoscopic-assisted left hemicolectomy for diverticulitis   LAPAROSCOPIC CHOLECYSTECTOMY  07-23-2006   POLYPECTOMY  02/22/2022   Procedure: POLYPECTOMY;  Surgeon: MaClarene EssexMD;  Location: [facility];  Service: Gastroenterology;;   PROSTATE BIOPSY  10/09/2012   gleason 3+4=7   RADIOACTIVE SEED IMPLANT N/A 01/09/2013   Procedure: RADIOACTIVE SEED IMPLANT;  Surgeon: StFranchot GalloMD;  [facility] Service: [facility];  Laterality: N/A;   RIGHT/LEFT HEART CATH AND CORONARY ANGIOGRAPHY N/A 08/11/2021   Procedure: RIGHT/LEFT HEART CATH AND CORONARY ANGIOGRAPHY;  Surgeon: McLarey DresserMD;  Location: [facility] CV LAB;  Service: Cardiovascular;  Laterality: N/A;   TEE WITHOUT CARDIOVERSION N/A 08/18/2016   Procedure: TRANSESOPHAGEAL ECHOCARDIOGRAM (TEE);  Surgeon: Thayer Headings, MD;  Location: [facility];  Service: Cardiovascular;  Laterality: N/A;   TRANSTHORACIC ECHOCARDIOGRAM  04-28-2008   MILD LVH / NORMAL LSF/ NORMAL AORTIC VALVE MECHANICAL PROSTHESIS FUNCTION/ MILD LAE/ EF 55-60%    Family History  Problem Relation Age of Onset   Heart failure Mother     Diabetes Mother    Prostate cancer Father     Social History   Tobacco Use   Smoking status: Never   Smokeless tobacco: Never  Vaping Use   Vaping Use: Never used  Substance Use Topics   Alcohol use: Yes    Alcohol/week: 3.0 standard drinks of alcohol    Types: 3 Cans of beer per week    Comment:  "may have 2-3 beers on the weekend"   Drug use: No    Prior to Admission medications   Medication Sig Start Date End Date Taking? Authorizing Provider  HYDROcodone-acetaminophen (NORCO) 5-325 MG tablet Take 1 tablet by mouth every 6 (six) hours as needed for severe pain. 07/11/22  Yes Paticia Moster, MD  acetaminophen (TYLENOL) 500 MG tablet Take 1,000 mg by mouth every evening.    [provider]  allopurinol (ZYLOPRIM) 300 MG tablet Take 300 mg by mouth in the morning.    [provider]  amoxicillin (AMOXIL) 500 MG capsule Take 2,000 mg by mouth See admin instructions. Take 2000 mg 1 hour prior to dental work    [provider]  aspirin 81 MG chewable tablet Chew 1 tablet (81 mg total) by mouth daily. 08/29/16   Clegg, Amy D, NP  colchicine 0.6 MG tablet Take 0.6 mg by mouth daily as needed (for gout).     [provider]  docusate sodium (COLACE) 100 MG capsule Take 100 mg by mouth 2 (two) times daily.    [provider]  ENTRESTO 24-26 MG TAKE 1 TABLET BY MOUTH TWICE A DAY 01/23/22   Larey Dresser, MD  FARXIGA 10 MG TABS tablet TAKE 1 TABLET BY MOUTH DAILY BEFORE BREAKFAST. 03/06/22   Larey Dresser, MD  FIBER PO Take 2 capsules by mouth 2 (two) times daily.    [provider]  folic acid (FOLVITE) 1 MG tablet TAKE 1 TABLET(1 MG) BY MOUTH DAILY 01/07/21   Volanda Napoleon, MD  metoprolol succinate (TOPROL-XL) 25 MG 24 hr tablet Take 1 tablet (25 mg total) by mouth at bedtime. 05/22/22   Larey Dresser, MD  spironolactone (ALDACTONE) 25 MG tablet Take 1 tablet (25 mg total) by mouth daily. 03/29/22   Larey Dresser, MD  tamsulosin  (FLOMAX) 0.4 MG CAPS capsule Take 0.4 mg by mouth every other day. Alternating days with tolterodine    [provider]  tolterodine (DETROL LA) 4 MG 24 hr capsule Take 4 mg by mouth every other day. Alternating days with the tamsulosin    [provider]  torsemide (DEMADEX) 20 MG tablet Patient takes 20 mg by mouth every other day alternating 10 mg daily.    [provider]  triamcinolone cream (KENALOG) 0.1 % Apply 1 Application topically 2 (two) times daily as needed for rash. 12/06/21   [provider]  warfarin (COUMADIN) 5 MG tablet Take 0.5-1 tablets (2.5-5 mg total) by mouth See admin instructions. 1/19 and 1/20  take 7.'5mg'$  (1 and 1/2 tab)  then take 1 tab ('5mg'$ ) daily until seen by MD  Take 1 tablet (5 mg) by mouth on Sundays, Mondays, Wednesdays, Thursdays & Saturdays in the evening. Take 0.5 tablet (2.5 mg) by mouth on Tuesdays & Fridays in the evening. Patient taking differently: Take 2.5-5 mg by mouth See admin instructions. Take 2.5 mg on Monday and Friday, All the other days take 5 mg in the evening 08/11/21   Larey Dresser, MD    Allergies Celecoxib   REVIEW OF SYSTEMS  Negative except as noted here or in the History of Present Illness.   PHYSICAL EXAMINATION  Initial Vital Signs Blood pressure 103/85, pulse 97, temperature 97.9 F (36.6 C), temperature source Oral, resp. rate 20, height '5\' 11"'$  (1.803 m), weight 85.3 kg, SpO2 95 %.  Examination General: Well-developed, well-nourished male in no acute distress; appearance consistent with age of record HENT: normocephalic; atraumatic Eyes: Normal appearance Neck: supple Heart: regular rate and rhythm Lungs: clear to auscultation bilaterally Abdomen: soft; nondistended; nontender; bowel sounds present Extremities: No deformity; tenderness and decreased range of motion of right shoulder, right upper extremity distally neurovascularly intact; trace edema of lower legs Neurologic: Awake,  alert and oriented; motor function intact in all extremities and symmetric; no facial droop Skin: Warm and dry; abrasion right knee:    Psychiatric: Normal mood and affect   RESULTS  Summary of this visit's results, reviewed and interpreted by myself:   EKG Interpretation  Date/Time:    Ventricular Rate:    PR Interval:    QRS Duration:   QT Interval:    QTC Calculation:   R Axis:     Text Interpretation:         Laboratory Studies: No results found for this or any previous visit (from the past 24 hour(s)). Imaging Studies: DG Knee Complete 4 Views Right  Result Date: 07/10/2022 CLINICAL DATA:  Fall, right knee pain EXAM: RIGHT KNEE - COMPLETE 4+ VIEW COMPARISON:  None Available. FINDINGS: No fracture or dislocation is seen. Mild degenerative changes. Visualized soft tissues are within normal limits. No suprapatellar knee joint effusion. IMPRESSION: Negative. Electronically Signed   By: Julian Hy M.D.   On: 07/10/2022 23:27   DG Shoulder Right  Result Date: 07/10/2022 CLINICAL DATA:  Fall EXAM: RIGHT SHOULDER - 2+ VIEW COMPARISON:  None Available. FINDINGS: No fracture or dislocation is seen. The joint spaces are preserved. Visualized soft tissues are within normal limits. IMPRESSION: Negative. Electronically Signed   By: Julian Hy M.D.   On: 07/10/2022 23:26    ED COURSE and MDM  Nursing notes, initial and subsequent vitals signs, including pulse oximetry, reviewed and interpreted by myself.  Vitals:   07/10/22 2259 07/10/22 2302  BP:  103/85  Pulse:  97  Resp:  20  Temp:  97.9 F (36.6 C)  TempSrc:  Oral  SpO2:  95%  Weight: 85.3 kg   Height: '5\' 11"'$  (1.803 m)    Medications  Tdap (BOOSTRIX) injection 0.5 mL (has no administration in time range)  HYDROcodone-acetaminophen (NORCO/VICODIN) 5-325 MG per tablet 1 tablet (has no administration in time range)   Patient placed in sling.  Although no bony injury was seen on radiographs he likely  injured his rotator cuff.  We will refer him to his orthopedist, Dr. Rhona Raider.  Will also update his tetanus status as he does not know when his last tetanus shot was.   PROCEDURES  Procedures   ED DIAGNOSES     ICD-10-CM  1. Fall on steps, initial encounter  W10.8XXA     2. Right shoulder injury, initial encounter  S49.91XA     3. Abrasion, right knee, initial encounter  R92.341G          Shanon Rosser, MD 07/11/22 [phone]

## 2022-07-26 DIAGNOSIS — M25511 Pain in right shoulder: Secondary | ICD-10-CM | POA: Diagnosis not present

## 2022-07-26 DIAGNOSIS — Z7901 Long term (current) use of anticoagulants: Secondary | ICD-10-CM | POA: Diagnosis not present

## 2022-07-27 DIAGNOSIS — N5089 Other specified disorders of the male genital organs: Secondary | ICD-10-CM | POA: Diagnosis not present

## 2022-07-29 DIAGNOSIS — N492 Inflammatory disorders of scrotum: Secondary | ICD-10-CM | POA: Diagnosis not present

## 2022-07-31 DIAGNOSIS — M25511 Pain in right shoulder: Secondary | ICD-10-CM | POA: Diagnosis not present

## 2022-08-04 DIAGNOSIS — N492 Inflammatory disorders of scrotum: Secondary | ICD-10-CM | POA: Diagnosis not present

## 2022-08-07 DIAGNOSIS — N492 Inflammatory disorders of scrotum: Secondary | ICD-10-CM | POA: Diagnosis not present

## 2022-08-10 DIAGNOSIS — L570 Actinic keratosis: Secondary | ICD-10-CM | POA: Diagnosis not present

## 2022-08-11 DIAGNOSIS — M25511 Pain in right shoulder: Secondary | ICD-10-CM | POA: Diagnosis not present

## 2022-08-14 DIAGNOSIS — N492 Inflammatory disorders of scrotum: Secondary | ICD-10-CM | POA: Diagnosis not present

## 2022-08-16 DIAGNOSIS — M75121 Complete rotator cuff tear or rupture of right shoulder, not specified as traumatic: Secondary | ICD-10-CM | POA: Diagnosis not present

## 2022-08-18 ENCOUNTER — Other Ambulatory Visit (HOSPITAL_COMMUNITY): Payer: Self-pay | Admitting: Cardiology

## 2022-08-22 DIAGNOSIS — M75121 Complete rotator cuff tear or rupture of right shoulder, not specified as traumatic: Secondary | ICD-10-CM | POA: Diagnosis not present

## 2022-08-25 DIAGNOSIS — M75121 Complete rotator cuff tear or rupture of right shoulder, not specified as traumatic: Secondary | ICD-10-CM | POA: Diagnosis not present

## 2022-08-29 ENCOUNTER — Telehealth (HOSPITAL_COMMUNITY): Payer: Self-pay | Admitting: Pharmacy Technician

## 2022-08-29 ENCOUNTER — Telehealth (HOSPITAL_COMMUNITY): Payer: Self-pay

## 2022-08-29 ENCOUNTER — Other Ambulatory Visit (HOSPITAL_COMMUNITY): Payer: Self-pay

## 2022-08-29 DIAGNOSIS — M75121 Complete rotator cuff tear or rupture of right shoulder, not specified as traumatic: Secondary | ICD-10-CM | POA: Diagnosis not present

## 2022-08-29 NOTE — Telephone Encounter (Signed)
Advanced Heart Failure Patient Advocate Encounter  Called and left patient vm regarding [place] affordability. Advised patient to call back.   Charlann Boxer, CPhT

## 2022-08-29 NOTE — Telephone Encounter (Signed)
Patient called and stated his insurance is no longer covering [place]. Can you check on this one?

## 2022-08-31 DIAGNOSIS — M75121 Complete rotator cuff tear or rupture of right shoulder, not specified as traumatic: Secondary | ICD-10-CM | POA: Diagnosis not present

## 2022-09-05 DIAGNOSIS — M75121 Complete rotator cuff tear or rupture of right shoulder, not specified as traumatic: Secondary | ICD-10-CM | POA: Diagnosis not present

## 2022-09-08 DIAGNOSIS — M75121 Complete rotator cuff tear or rupture of right shoulder, not specified as traumatic: Secondary | ICD-10-CM | POA: Diagnosis not present

## 2022-09-11 ENCOUNTER — Other Ambulatory Visit (HOSPITAL_COMMUNITY): Payer: Self-pay

## 2022-09-12 DIAGNOSIS — M75121 Complete rotator cuff tear or rupture of right shoulder, not specified as traumatic: Secondary | ICD-10-CM | POA: Diagnosis not present

## 2022-09-14 DIAGNOSIS — M75121 Complete rotator cuff tear or rupture of right shoulder, not specified as traumatic: Secondary | ICD-10-CM | POA: Diagnosis not present

## 2022-09-15 ENCOUNTER — Other Ambulatory Visit (HOSPITAL_COMMUNITY): Payer: Self-pay | Admitting: Cardiology

## 2022-09-18 ENCOUNTER — Other Ambulatory Visit (HOSPITAL_COMMUNITY): Payer: Self-pay | Admitting: Cardiology

## 2022-09-18 ENCOUNTER — Other Ambulatory Visit (HOSPITAL_COMMUNITY): Payer: Self-pay

## 2022-09-19 ENCOUNTER — Telehealth (HOSPITAL_COMMUNITY): Payer: Self-pay | Admitting: Pharmacy Technician

## 2022-09-19 ENCOUNTER — Other Ambulatory Visit (HOSPITAL_COMMUNITY): Payer: Self-pay

## 2022-09-19 DIAGNOSIS — M75121 Complete rotator cuff tear or rupture of right shoulder, not specified as traumatic: Secondary | ICD-10-CM | POA: Diagnosis not present

## 2022-09-19 NOTE — Telephone Encounter (Signed)
Advanced Heart Failure Patient Advocate Encounter  Called and left patient vm in regards to [employer] coverage. Not sure what issue there is, will be here to assist when the patient calls back.  Charlann Boxer, CPhT

## 2022-09-20 ENCOUNTER — Other Ambulatory Visit (HOSPITAL_COMMUNITY): Payer: Self-pay

## 2022-09-21 ENCOUNTER — Other Ambulatory Visit (HOSPITAL_COMMUNITY): Payer: Self-pay

## 2022-09-21 DIAGNOSIS — M75121 Complete rotator cuff tear or rupture of right shoulder, not specified as traumatic: Secondary | ICD-10-CM | POA: Diagnosis not present

## 2022-09-21 NOTE — Telephone Encounter (Signed)
Advanced Heart Failure Patient Advocate [employer] plan, test claim was not properly reversed in the system, causing a refill too soon error. Insurance was able to remove the claim. Contacted [facility] to reprocess and they have confirmed a $47 copay.   Spoke to patient by phone to update him that rx is filled and ready.  Clista Bernhardt, CPhT Rx Patient Advocate Phone: [phone]

## 2022-09-22 DIAGNOSIS — Z7901 Long term (current) use of anticoagulants: Secondary | ICD-10-CM | POA: Diagnosis not present

## 2022-09-29 DIAGNOSIS — M75121 Complete rotator cuff tear or rupture of right shoulder, not specified as traumatic: Secondary | ICD-10-CM | POA: Diagnosis not present

## 2022-10-03 DIAGNOSIS — M75121 Complete rotator cuff tear or rupture of right shoulder, not specified as traumatic: Secondary | ICD-10-CM | POA: Diagnosis not present

## 2022-10-04 DIAGNOSIS — Z03818 Encounter for observation for suspected exposure to other biological agents ruled out: Secondary | ICD-10-CM | POA: Diagnosis not present

## 2022-10-04 DIAGNOSIS — R0602 Shortness of breath: Secondary | ICD-10-CM | POA: Diagnosis not present

## 2022-10-04 DIAGNOSIS — M75121 Complete rotator cuff tear or rupture of right shoulder, not specified as traumatic: Secondary | ICD-10-CM | POA: Diagnosis not present

## 2022-10-04 DIAGNOSIS — R051 Acute cough: Secondary | ICD-10-CM | POA: Diagnosis not present

## 2022-10-05 DIAGNOSIS — M75121 Complete rotator cuff tear or rupture of right shoulder, not specified as traumatic: Secondary | ICD-10-CM | POA: Diagnosis not present

## 2022-10-09 DIAGNOSIS — R053 Chronic cough: Secondary | ICD-10-CM | POA: Diagnosis not present

## 2022-10-10 DIAGNOSIS — M75121 Complete rotator cuff tear or rupture of right shoulder, not specified as traumatic: Secondary | ICD-10-CM | POA: Diagnosis not present

## 2022-10-12 DIAGNOSIS — M75121 Complete rotator cuff tear or rupture of right shoulder, not specified as traumatic: Secondary | ICD-10-CM | POA: Diagnosis not present

## 2022-10-16 ENCOUNTER — Telehealth (HOSPITAL_COMMUNITY): Payer: Self-pay | Admitting: Cardiology

## 2022-10-16 ENCOUNTER — Emergency Department (HOSPITAL_BASED_OUTPATIENT_CLINIC_OR_DEPARTMENT_OTHER): Payer: Medicare Other

## 2022-10-16 ENCOUNTER — Encounter (HOSPITAL_BASED_OUTPATIENT_CLINIC_OR_DEPARTMENT_OTHER): Payer: Self-pay | Admitting: Urology

## 2022-10-16 ENCOUNTER — Other Ambulatory Visit: Payer: Self-pay

## 2022-10-16 ENCOUNTER — Inpatient Hospital Stay (HOSPITAL_BASED_OUTPATIENT_CLINIC_OR_DEPARTMENT_OTHER)
Admission: EM | Admit: 2022-10-16 | Discharge: 2022-10-19 | DRG: 871 | Disposition: A | Discharge status: Home / Self Care | Payer: Medicare Other | Attending: Student | Admitting: Student

## 2022-10-16 DIAGNOSIS — J90 Pleural effusion, not elsewhere classified: Secondary | ICD-10-CM | POA: Diagnosis not present

## 2022-10-16 DIAGNOSIS — K429 Umbilical hernia without obstruction or gangrene: Secondary | ICD-10-CM | POA: Diagnosis not present

## 2022-10-16 DIAGNOSIS — Z886 Allergy status to analgesic agent status: Secondary | ICD-10-CM

## 2022-10-16 DIAGNOSIS — J189 Pneumonia, unspecified organism: Principal | ICD-10-CM | POA: Diagnosis present

## 2022-10-16 DIAGNOSIS — I4891 Unspecified atrial fibrillation: Secondary | ICD-10-CM | POA: Diagnosis not present

## 2022-10-16 DIAGNOSIS — Z8546 Personal history of malignant neoplasm of prostate: Secondary | ICD-10-CM

## 2022-10-16 DIAGNOSIS — I480 Paroxysmal atrial fibrillation: Secondary | ICD-10-CM | POA: Diagnosis present

## 2022-10-16 DIAGNOSIS — I5043 Acute on chronic combined systolic (congestive) and diastolic (congestive) heart failure: Secondary | ICD-10-CM | POA: Diagnosis not present

## 2022-10-16 DIAGNOSIS — M109 Gout, unspecified: Secondary | ICD-10-CM | POA: Diagnosis not present

## 2022-10-16 DIAGNOSIS — N400 Enlarged prostate without lower urinary tract symptoms: Secondary | ICD-10-CM | POA: Insufficient documentation

## 2022-10-16 DIAGNOSIS — C61 Malignant neoplasm of prostate: Secondary | ICD-10-CM | POA: Diagnosis not present

## 2022-10-16 DIAGNOSIS — Z1152 Encounter for screening for COVID-19: Secondary | ICD-10-CM | POA: Diagnosis not present

## 2022-10-16 DIAGNOSIS — Z7982 Long term (current) use of aspirin: Secondary | ICD-10-CM

## 2022-10-16 DIAGNOSIS — R059 Cough, unspecified: Secondary | ICD-10-CM | POA: Diagnosis not present

## 2022-10-16 DIAGNOSIS — K5730 Diverticulosis of large intestine without perforation or abscess without bleeding: Secondary | ICD-10-CM | POA: Diagnosis not present

## 2022-10-16 DIAGNOSIS — I10 Essential (primary) hypertension: Secondary | ICD-10-CM | POA: Diagnosis not present

## 2022-10-16 DIAGNOSIS — Z8042 Family history of malignant neoplasm of prostate: Secondary | ICD-10-CM

## 2022-10-16 DIAGNOSIS — I110 Hypertensive heart disease with heart failure: Secondary | ICD-10-CM | POA: Diagnosis present

## 2022-10-16 DIAGNOSIS — Z79899 Other long term (current) drug therapy: Secondary | ICD-10-CM

## 2022-10-16 DIAGNOSIS — Z7901 Long term (current) use of anticoagulants: Secondary | ICD-10-CM | POA: Diagnosis not present

## 2022-10-16 DIAGNOSIS — E872 Acidosis, unspecified: Secondary | ICD-10-CM | POA: Diagnosis present

## 2022-10-16 DIAGNOSIS — A419 Sepsis, unspecified organism: Secondary | ICD-10-CM | POA: Diagnosis not present

## 2022-10-16 DIAGNOSIS — M7981 Nontraumatic hematoma of soft tissue: Secondary | ICD-10-CM | POA: Diagnosis present

## 2022-10-16 DIAGNOSIS — Z8249 Family history of ischemic heart disease and other diseases of the circulatory system: Secondary | ICD-10-CM

## 2022-10-16 DIAGNOSIS — Z952 Presence of prosthetic heart valve: Secondary | ICD-10-CM | POA: Diagnosis not present

## 2022-10-16 DIAGNOSIS — J310 Chronic rhinitis: Secondary | ICD-10-CM | POA: Diagnosis present

## 2022-10-16 DIAGNOSIS — Z833 Family history of diabetes mellitus: Secondary | ICD-10-CM | POA: Diagnosis not present

## 2022-10-16 DIAGNOSIS — R0602 Shortness of breath: Secondary | ICD-10-CM | POA: Diagnosis not present

## 2022-10-16 DIAGNOSIS — I452 Bifascicular block: Secondary | ICD-10-CM | POA: Diagnosis not present

## 2022-10-16 DIAGNOSIS — T45515A Adverse effect of anticoagulants, initial encounter: Secondary | ICD-10-CM | POA: Diagnosis not present

## 2022-10-16 DIAGNOSIS — R079 Chest pain, unspecified: Secondary | ICD-10-CM | POA: Diagnosis not present

## 2022-10-16 LAB — COMPREHENSIVE METABOLIC PANEL
ALT: 38 U/L (ref 0–44)
AST: 44 U/L — ABNORMAL HIGH (ref 15–41)
Albumin: 3.7 g/dL (ref 3.5–5.0)
Alkaline Phosphatase: 83 U/L (ref 38–126)
Anion gap: 10 (ref 5–15)
BUN: 21 mg/dL (ref 8–23)
CO2: 21 mmol/L — ABNORMAL LOW (ref 22–32)
Calcium: 8.7 mg/dL — ABNORMAL LOW (ref 8.9–10.3)
Chloride: 100 mmol/L (ref 98–111)
Creatinine, Ser: 1.12 mg/dL (ref 0.61–1.24)
GFR, Estimated: >60 mL/min (ref >60)
Glucose, Bld: 112 mg/dL — ABNORMAL HIGH (ref 70–99)
Potassium: 4.5 mmol/L (ref 3.5–5.1)
Sodium: 131 mmol/L — ABNORMAL LOW (ref 135–145)
Total Bilirubin: 1.9 mg/dL — ABNORMAL HIGH (ref 0.3–1.2)
Total Protein: 6.9 g/dL (ref 6.5–8.1)

## 2022-10-16 LAB — PROTIME-INR
INR: 3.6 — ABNORMAL HIGH (ref 0.8–1.2)
Prothrombin Time: 35.5 seconds — ABNORMAL HIGH (ref 11.4–15.2)

## 2022-10-16 LAB — LACTIC ACID, PLASMA
Lactic Acid, Venous: 1.5 mmol/L (ref 0.5–1.9)
Lactic Acid, Venous: 1.4 mmol/L (ref 0.5–1.9)

## 2022-10-16 LAB — CBC
HCT: 45 % (ref 39.0–52.0)
Hemoglobin: 13.7 g/dL (ref 13.0–17.0)
MCH: 19.2 pg — ABNORMAL LOW (ref 26.0–34.0)
MCHC: 30.4 g/dL (ref 30.0–36.0)
MCV: 63.2 fL — ABNORMAL LOW (ref 80.0–100.0)
Platelets: 295 10*3/uL (ref 150–400)
RBC: 7.12 MIL/uL — ABNORMAL HIGH (ref 4.22–5.81)
RDW: 18.8 % — ABNORMAL HIGH (ref 11.5–15.5)
WBC: 13.4 10*3/uL — ABNORMAL HIGH (ref 4.0–10.5)
nRBC: 0.4 % — ABNORMAL HIGH (ref 0.0–0.2)

## 2022-10-16 LAB — RESP PANEL BY RT-PCR (RSV, FLU A&B, COVID)  RVPGX2
Influenza A by PCR: NEGATIVE
Influenza B by PCR: NEGATIVE
Resp Syncytial Virus by PCR: NEGATIVE
SARS Coronavirus 2 by RT PCR: NEGATIVE

## 2022-10-16 LAB — BRAIN NATRIURETIC PEPTIDE: B Natriuretic Peptide: 1788.8 pg/mL — ABNORMAL HIGH (ref 0.0–100.0)

## 2022-10-16 MED ORDER — IOHEXOL 300 MG/ML  SOLN
100.0000 mL | Freq: Once | INTRAMUSCULAR | Status: AC | PRN
  Administered 2022-10-16: 100 mL via INTRAVENOUS

## 2022-10-16 MED ORDER — FUROSEMIDE 10 MG/ML IJ SOLN
60.0000 mg | Freq: Once | INTRAMUSCULAR | Status: AC
  Administered 2022-10-16: 60 mg via INTRAVENOUS
  Filled 2022-10-16: qty 6

## 2022-10-16 MED ORDER — SODIUM CHLORIDE 0.9 % IV SOLN
2.0000 g | INTRAVENOUS | Status: DC
  Administered 2022-10-16 – 2022-10-18 (×3): 2 g via INTRAVENOUS
  Filled 2022-10-16 (×4): qty 20

## 2022-10-16 MED ORDER — SODIUM CHLORIDE 0.9 % IV SOLN
500.0000 mg | INTRAVENOUS | Status: DC
  Administered 2022-10-16 – 2022-10-18 (×3): 500 mg via INTRAVENOUS
  Filled 2022-10-16 (×4): qty 5

## 2022-10-16 NOTE — ED Notes (Addendum)
To Ct and back wife iin room , pts clothes in bags given to wife

## 2022-10-16 NOTE — ED Provider Notes (Signed)
Emergency Department Provider Note   I have reviewed the triage vital signs and the nursing notes.   HISTORY  Chief Complaint Shortness of Breath   HPI Adrian Lambert is a 82 y.o. male with past history of congestive heart failure (EF 35-40%), A-fib on anticoagulation, and HTN presents emergency department with increased shortness of breath over the last 3 to 4 days.  His wife, at bedside, tells me that he has had cough for the past several weeks involving his PCP.  He been on a course of azithromycin and cough medication with no relief.  Over the past 3 to 4 days he has developed shortness of breath in addition to the cough.  No fevers or shaking chills.  He has not appreciated much fluid accumulation in his legs and has been compliant with his medications.  Denies any chest pain, tightness, pressure.  No abdominal discomfort.  On initial evaluation of noticing some bruising to his lower abdomen and flank.  He denies any abdominal pain or recent falls.  He did fall in December. Wife and patient are both unaware of the bruising.   Past Medical History:  Diagnosis Date   Anticoagulant [name]    Arthritis    "probably in my thumbs" (06/26/2012)   Bifascicular block    Chronic combined systolic and diastolic CHF (congestive heart failure) ([place])    a. 01/2016 Echo: EF 45-50%, gr2 DD, inflat AK (poor acoustic windows);  b. 07/2016 Echo: EF 25-30%, mild LVH, PASP 53mmHg (pt in Afib).   Complication of anesthesia    "wake up w/a start; hallucinations" (06/26/2012)   Critical Aortic Stenosis    a. 03/2003 s/p SJM #25 mech prosthetic AoV;  b. 07/2016 Echo: EF 25-30% (in setting of AF), AoV area 1.72 cm^2 (VTI), 1.75 cm^2 (Vmean).   Diverticulosis    Gouty arthritis    "have had it in both feet, ankles, right knee" (06/26/2012)   History of diverticulitis of colon    History of kidney stones    History of pancreatitis    Hypertension    Incomplete RBBB    PAF (paroxysmal atrial  fibrillation) ([name])    a. CHA2DS2VASc = 4-->chronic coumadin in setting of mech AoV;  b. 07/2016 Recurrent AF RVR.    Review of Systems  Constitutional: No fever/chills Eyes: No visual changes. ENT: No sore throat. Cardiovascular: Denies chest pain. Respiratory: Positive shortness of breath and cough.  Gastrointestinal: No abdominal pain.  No nausea, no vomiting.  Genitourinary: Negative for dysuria. Musculoskeletal: Negative for back pain. Skin: Negative for rash. Neurological: Negative for headaches, focal weakness or numbness.  ____________________________________________   PHYSICAL EXAM:  VITAL SIGNS: ED Triage Vitals  Enc Vitals Group     BP 10/16/22 1417 107/83     Pulse Rate 10/16/22 1415 100     Resp 10/16/22 1415 18     Temp 10/16/22 1415 (!) 97.5 F (36.4 C)     Temp Source 10/16/22 1415 Oral     SpO2 10/16/22 1415 95 %     Weight 10/16/22 1411 188 lb 0.8 oz (85.3 kg)     Height 10/16/22 1411 5\' 11"  (1.803 m)   Constitutional: Alert and oriented. Well appearing and in no acute distress. Eyes: Conjunctivae are normal.  Head: Atraumatic. Nose: No congestion/rhinnorhea. Mouth/Throat: Mucous membranes are moist.   Cardiovascular: Tachycardia. Good peripheral circulation. Grossly normal heart sounds.   Respiratory: Increased respiratory effort.  No retractions. Lungs without wheezing. Positive crackles and faint  rhonchi worse on the right.  Gastrointestinal: Soft and nontender. No distention.  Musculoskeletal: No lower extremity tenderness positive 1+ pitting edema in the bilateral LEs. No gross deformities of extremities. Neurologic:  Normal speech and language.  Skin:  Skin is warm and dry. Ecchymosis to the left flank and lower abdomen.    ____________________________________________   LABS (all labs ordered are listed, but only abnormal results are displayed)  Labs Reviewed  RESP PANEL BY RT-PCR (RSV, FLU A&B, COVID)  [id number]  CBC  PROTIME-INR   COMPREHENSIVE METABOLIC PANEL  BRAIN NATRIURETIC PEPTIDE   ____________________________________________  EKG   EKG Interpretation  Date/Time:  Monday October 16 2022 14:15:06 EDT Ventricular Rate:  99 PR Interval:  199 QRS Duration: 128 QT Interval:  366 QTC Calculation: 470 R Axis:   -74 Text Interpretation: Sinus tachycardia Right bundle branch block Inferior infarct, old Anterior infarct, old Simialr to prior tracing now with tachycardia Confirmed by Nanda Quinton [phone]) on 10/16/2022 2:19:50 PM        ____________________________________________  RADIOLOGY  No results found.  ____________________________________________   PROCEDURES  Procedure(s) performed:   Procedures   ____________________________________________   INITIAL IMPRESSION / ASSESSMENT AND PLAN / ED COURSE  Pertinent labs & imaging results that were available during my care of the patient were reviewed by me and considered in my medical decision making (see chart for details).   This patient is Presenting for Evaluation of SOB, which does require a range of treatment options, and is a complaint that involves a high risk of morbidity and mortality.  The Differential Diagnoses includes viral panel, CAP, bronchitis, PE, ACS, CHF exacerbation, etc.  Critical Interventions-    Medications - No data to display  Reassessment after intervention:     I did obtain Additional Historical Information from patient's wife at bedside.  I decided to review pertinent External Data, and in summary last ECHO in our system is 2023 showing global hypokinesis and EF of 35 to 40%.   Clinical Laboratory Tests Ordered, included ***  Radiologic Tests Ordered, included CXR. I independently interpreted the images and agree with radiology interpretation.   Cardiac Monitor Tracing which shows sinus tachycardia.    Social Determinants of Health Risk patient is a non-smoker. Wife was a smoker until 2011.   Consult  complete with  Medical Decision Making: Summary:  Presents emergency department with shortness of breath worsening over the past several days.  Appears to be somewhat uncomfortable on arrival but no pain.  Bruising to the abdomen and left flank noted.  Seems superficial and somewhat older but may ultimately require imaging given he is anticoagulated.  No recent falls.   Reevaluation with update and discussion with   ***Considered admission***  Patient's presentation is most consistent with acute presentation with potential threat to life or bodily function.   Disposition:   ____________________________________________  FINAL CLINICAL IMPRESSION(S) / ED DIAGNOSES  Final diagnoses:  None     NEW OUTPATIENT MEDICATIONS STARTED DURING THIS VISIT:  New Prescriptions   No medications on file    Note:  This document was prepared using Dragon voice recognition software and may include unintentional dictation errors.  Nanda Quinton, MD, [facility]

## 2022-10-16 NOTE — Telephone Encounter (Signed)
   Hypotensive and concern for recurrent A fib. Hold entresto.   He needs to go to the ED today for a work up.   Please call.  Lashondra Vaquerano NP-C  /1:34 PM

## 2022-10-16 NOTE — ED Notes (Signed)
Patients suctions canister changed. Output documented.

## 2022-10-16 NOTE — Telephone Encounter (Signed)
Pt reports he is back in a fib Reports to increased SOB 3 days ago since then breathing has gotten worse.  This morning bp 80/56 HR 99 o2 sat 98% During the duration of the call pts breathing was labored  Please advise

## 2022-10-16 NOTE — ED Notes (Signed)
Blood cultures obtained prior to ABX administration

## 2022-10-16 NOTE — Plan of Care (Signed)

## 2022-10-16 NOTE — ED Notes (Signed)
Pt given lasix and so sob cannot stand at this time , external urinary catheter placed

## 2022-10-16 NOTE — ED Triage Notes (Signed)
Pt states Sob that started 3 days ago, saw pcp and dx with bronchitis and put on antibiotics but is getting worse  Also reports cough  States h/o afib with similar symptoms in the past, CHF history as well  Artificial valve, no pacemaker

## 2022-10-16 NOTE — Telephone Encounter (Signed)
Pt aware and voiced understanding 

## 2022-10-16 NOTE — H&P (Signed)
History and Physical  Adrian Lambert [id number] DOB: Apr 04, 1941 DOA: 10/16/2022  Referring physician: Dr. Alfredia Ferguson  PCP: Lawerance Cruel, MD  Outpatient Specialists: Cardiology Patient coming from: Home  Chief Complaint: Shortness of breath and dry cough   HPI: Adrian Lambert is a 82 y.o. 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  No chest pain.  In the ED, work up revealed multifocal pneumonia, pulmonary edema, billateral pleural effusion..  ED Course:   Review of Systems: Review of systems as noted in the HPI. All other systems reviewed and are negative.   Past Medical History:  Diagnosis Date   Anticoagulant long-term use    Arthritis    "probably in my thumbs" (06/26/2012)   Bifascicular block    Chronic combined systolic and diastolic CHF (congestive heart failure) ([place])    a. 01/2016 Echo: EF 45-50%, gr2 DD, inflat AK (poor acoustic windows);  b. 07/2016 Echo: EF 25-30%, mild LVH, PASP 37mmHg (pt in Afib).   Complication of anesthesia    "wake up w/a start; hallucinations" (06/26/2012)   Critical Aortic Stenosis    a. 03/2003 s/p SJM #25 mech prosthetic AoV;  b. 07/2016 Echo: EF 25-30% (in setting of AF), AoV area 1.72 cm^2 (VTI), 1.75 cm^2 (Vmean).   Diverticulosis    Gouty arthritis    "have had it in both feet, ankles, right knee" (06/26/2012)   History of diverticulitis of colon    History of kidney stones    History of pancreatitis    Hypertension    Incomplete RBBB    PAF (paroxysmal atrial fibrillation) ([place])    a. CHA2DS2VASc = 4-->chronic coumadin in setting of mech AoV;  b. 07/2016 Recurrent AF RVR.   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  DR GERHARDT   #25mm [facility] JUDE MECHNICAL PROSTHESIS    ATRIAL FIBRILLATION ABLATION N/A 11/28/2016   Procedure: Atrial Fibrillation Ablation;  Surgeon: Constance Haw, MD;  Location: [place] [facility] LAB;  Service: Cardiovascular;  Laterality: N/A;   CARDIOVERSION N/A 08/18/2016   Procedure: CARDIOVERSION;  Surgeon: Thayer Headings, MD;  Location: [facility] ENDOSCOPY;  Service: Cardiovascular;  Laterality: N/A;   CARDIOVERSION N/A 08/28/2016   Procedure: CARDIOVERSION;  Surgeon: Larey Dresser, MD;  Location: [facility] ENDOSCOPY;  Service: Cardiovascular;  Laterality: N/A;   CARDIOVERSION N/A 04/05/2022   Procedure: CARDIOVERSION;  Surgeon: Larey Dresser, MD;  Location: [facility] ENDOSCOPY;  Service: Cardiovascular;  Laterality: N/A;   CATARACT EXTRACTION W/ INTRAOCULAR LENS  IMPLANT, BILATERAL  RIGHT 2010/  LEFT DEC 2013   COLONOSCOPY WITH PROPOFOL N/A 02/22/2022   Procedure: COLONOSCOPY WITH PROPOFOL;  Surgeon: Clarene Essex, MD;  Location: [facility] ENDOSCOPY;  Service: Gastroenterology;  Laterality: N/A;   HEMICOLECTOMY  10/06/2003   Laparoscopic-assisted left hemicolectomy for diverticulitis   LAPAROSCOPIC CHOLECYSTECTOMY  07-23-2006   POLYPECTOMY  02/22/2022   Procedure: POLYPECTOMY;  Surgeon: Clarene Essex, MD;  Location: [facility] ENDOSCOPY;  Service: Gastroenterology;;   PROSTATE BIOPSY  10/09/2012   gleason 3+4=7   RADIOACTIVE SEED IMPLANT N/A 01/09/2013   Procedure: RADIOACTIVE SEED IMPLANT;  Surgeon: Franchot Gallo, MD;  [facility];  Service: Urology;  Laterality: N/A;   RIGHT/LEFT HEART CATH AND CORONARY ANGIOGRAPHY N/A 08/11/2021   Procedure: RIGHT/LEFT HEART CATH AND CORONARY ANGIOGRAPHY;  Surgeon: Larey Dresser, MD;  Location: [place] [facility];  Service: Cardiovascular;  Laterality: N/A;   TEE WITHOUT CARDIOVERSION N/A 08/18/2016   Procedure: TRANSESOPHAGEAL ECHOCARDIOGRAM (TEE);  Surgeon: Thayer Headings, MD;  Location: [place];  Service: Cardiovascular;  Laterality: N/A;   TRANSTHORACIC ECHOCARDIOGRAM  04-28-2008   MILD LVH / NORMAL  LSF/ NORMAL AORTIC VALVE MECHANICAL PROSTHESIS FUNCTION/ MILD LAE/ EF 55-60%    Social History:  reports that he has never smoked. He has never used smokeless tobacco. He reports current alcohol use of about 3.0 standard drinks of alcohol per week. He reports that he does not use drugs.   Allergies  Allergen Reactions   Celecoxib Rash and Other (See Comments)    Celebrex    Family History  Problem Relation Age of Onset   Heart failure Mother    Diabetes Mother    Prostate cancer Father     ***  Prior to Admission medications   Medication Sig Start Date End Date Taking? Authorizing Provider  acetaminophen (TYLENOL) 500 MG tablet Take 1,000 mg by mouth every evening.    [provider]  allopurinol (ZYLOPRIM) 300 MG tablet Take 300 mg by mouth in the morning.    [provider]  amoxicillin (AMOXIL) 500 MG capsule Take 2,000 mg by mouth See admin instructions. Take 2000 mg 1 hour prior to dental work    [provider]  aspirin 81 MG chewable tablet Chew 1 tablet (81 mg total) by mouth daily. 08/29/16   Clegg, Amy D, NP  colchicine 0.6 MG tablet Take 0.6 mg by mouth daily as needed (for gout).     [provider]  dapagliflozin propanediol (FARXIGA) 10 MG TABS tablet TAKE 1 TABLET BY MOUTH EVERY DAY BEFORE BREAKFAST 09/15/22   Larey Dresser, MD  docusate sodium (COLACE) 100 MG capsule Take 100 mg by mouth 2 (two) times daily.    [provider]  ENTRESTO 24-26 MG TAKE 1 TABLET BY MOUTH TWICE A DAY 09/18/22   Larey Dresser, MD  FIBER PO Take 2 capsules by mouth 2 (two) times daily.    [provider]  folic acid (FOLVITE) 1 MG tablet TAKE 1 TABLET(1 MG) BY MOUTH DAILY 01/07/21   Volanda Napoleon, MD  HYDROcodone-acetaminophen (NORCO) 5-325 MG tablet Take 1 tablet by mouth every 6 (six) hours as needed for severe pain. 07/11/22   Molpus, John, MD  metoprolol succinate (TOPROL-XL) 25 MG 24 hr tablet TAKE 1 TAB BY MOUTH AT BEDTIME  09/18/22   Larey Dresser, MD  spironolactone (ALDACTONE) 25 MG tablet Take 1 tablet (25 mg total) by mouth daily. 03/29/22   Larey Dresser, MD  tamsulosin (FLOMAX) 0.4 MG CAPS capsule Take 0.4 mg by mouth every other day. Alternating days with tolterodine    [provider]  tolterodine (DETROL LA) 4 MG 24 hr capsule Take 4 mg by mouth every other day. Alternating days with the tamsulosin    [provider]  torsemide (DEMADEX) 20 MG tablet TAKE 1 TABLET (20 MG TOTAL) BY MOUTH SEE ADMIN INSTRUCTIONS. TAKE 1 TABLET (20 MG) BY MOUTH DAILY 08/18/22   Larey Dresser, MD  triamcinolone cream (KENALOG) 0.1 % Apply 1 Application topically 2 (two) times daily as needed for rash. 12/06/21   [provider]  warfarin (COUMADIN) 5 MG tablet Take 0.5-1 tablets (2.5-5 mg total) by mouth See admin instructions. 1/19 and 1/20  take 7.5mg  (1 and 1/2 tab)  then take 1 tab (5mg ) daily until seen by MD  Take 1 tablet (5 mg) by mouth on Sundays, Mondays, Wednesdays, Thursdays & Saturdays in the evening. Take 0.5 tablet (2.5 mg) by mouth on Tuesdays & Fridays in the evening. Patient taking differently: Take 2.5-5 mg by mouth See admin instructions. Take 2.5 mg on Monday and Friday, All the other days take 5 mg in the evening 08/11/21   Larey Dresser, MD    Physical Exam: BP (!) 116/99 (BP Location: Left Arm)   Pulse (!) 109   Temp 98.4 F (36.9 C) (Oral)   Resp 17   Ht 5\' 11"  (1.803 m)   Wt 94.8 kg   SpO2 99%   BMI 29.15 kg/m   General: 82 y.o. year-old male well developed well nourished in no acute distress.  Alert and oriented x3. Cardiovascular: Regular rate and rhythm with no rubs or gallops.  No thyromegaly or JVD noted.  No lower extremity edema. 2/4 pulses in all 4 extremities. Respiratory: Clear to auscultation with no wheezes or rales. Good inspiratory effort. Abdomen: Soft nontender nondistended with normal bowel sounds x4 quadrants. Muskuloskeletal: No cyanosis,  clubbing or edema noted bilaterally Neuro: CN II-XII intact, strength, sensation, reflexes Skin: No ulcerative lesions noted or rashes Psychiatry: Judgement and insight appear normal. Mood is appropriate for condition and setting          Labs on Admission:  Basic Metabolic Panel: Recent Labs  Lab 10/16/22 1414  NA 131*  K 4.5  CL 100  CO2 21*  GLUCOSE 112*  BUN 21  CREATININE 1.12  CALCIUM 8.7*   Liver Function Tests: Recent Labs  Lab 10/16/22 1414  AST 44*  ALT 38  ALKPHOS 83  BILITOT 1.9*  PROT 6.9  ALBUMIN 3.7   No results for input(s): "LIPASE", "AMYLASE" in the last 168 hours. No results for input(s): "AMMONIA" in the last 168 hours. CBC: Recent Labs  Lab 10/16/22 1414  WBC 13.4*  HGB 13.7  HCT 45.0  MCV 63.2*  PLT 295   Cardiac Enzymes: No results for input(s): "CKTOTAL", "CKMB", "CKMBINDEX", "TROPONINI" in the last 168 hours.  BNP (last 3 results) Recent Labs    03/29/22 1110 04/19/22 0950 10/16/22 1430  BNP 708.6* 495.4* 1,788.8*    ProBNP (last 3 results) No results for input(s): "PROBNP" in the last 8760 hours.  CBG: No results for input(s): "GLUCAP" in the last 168 hours.  Radiological Exams on Admission: CT CHEST ABDOMEN PELVIS W CONTRAST  Result Date: 10/16/2022 CLINICAL DATA:  Sepsis with left flank bruising cough EXAM: CT CHEST, ABDOMEN, AND PELVIS WITH CONTRAST TECHNIQUE: Multidetector CT imaging of the chest, abdomen and pelvis was performed following the standard protocol during bolus administration of intravenous contrast. RADIATION DOSE REDUCTION: This exam was performed according to the departmental dose-optimization program which includes automated exposure control, adjustment of the mA and/or kV according to patient size and/or use of iterative reconstruction technique. CONTRAST:  160mL OMNIPAQUE IOHEXOL 300 MG/ML  SOLN COMPARISON:  Chest x-ray 10/16/2022, MRI 12/15/2021 FINDINGS: CT CHEST FINDINGS Cardiovascular: Mild aortic  atherosclerosis. 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 Maximum arch diameter of 3.2 cm previously 3.1 cm. No dissection. Status post aortic valve replacement. Borderline cardiomegaly. No pericardial effusion. Numerous enhancing collateral vessels at the right shoulder and chest wall, with possible stenosis of the right subclavian vein prior to its confluence with the jugular vein. Mediastinum/Nodes: Midline trachea. No thyroid mass. Borderline  right paratracheal nodes measuring up to 11 mm. Esophagus within normal limits Lungs/Pleura: Small moderate bilateral effusions. Heterogeneous right greater than left ground-glass densities and irregular consolidations. No visible pneumothorax. Musculoskeletal: Post sternotomy changes. No acute osseous abnormality. CT ABDOMEN PELVIS FINDINGS Hepatobiliary: No focal liver abnormality is seen. Status post cholecystectomy. No biliary dilatation. Pancreas: Unremarkable. No pancreatic ductal dilatation or surrounding inflammatory changes. Spleen: Normal in size without focal abnormality. Adrenals/Urinary Tract: Adrenal glands are within normal limits. Bilateral renal cysts measuring up to 7 cm lower pole right kidney, no imaging follow-up is recommended. No hydronephrosis. The bladder is unremarkable Stomach/Bowel: Stomach is within normal limits. Appendix appears normal. No evidence of bowel wall thickening, distention, or inflammatory changes. Diverticular disease of the colon without acute inflammatory process. Vascular/Lymphatic: Mild aortic atherosclerosis. No aneurysm. No suspicious lymph nodes. Reproductive: Multiple prostate seeds. Other: Negative for pelvic effusion or free air. Small fat containing inguinal hernias. Small umbilical hernia containing fat and anterior wall of small bowel. There is edema within the subcutaneous fat surrounding the hernia but no inflammation within the hernia sac. Musculoskeletal: Multilevel  degenerative changes. No acute osseous abnormality. IMPRESSION: 1. Small to moderate bilateral effusions. Heterogeneous right greater than left ground-glass densities and irregular consolidations suspicious for multifocal pneumonia to include atypical or viral organisms. 2. No CT evidence for acute intra-abdominal or pelvic abnormality. Diverticular disease of the colon without acute inflammatory process. 3. Small umbilical hernia containing fat and anterior wall of small bowel. There is edema within the subcutaneous fat surrounding the hernia but no inflammation within the hernia sac. Correlate with direct inspection. 4. Numerous enhancing collateral vessels at the right shoulder and chest wall, with possible stenosis of the right central subclavian vein. 5. Aortic atherosclerosis. Aortic Atherosclerosis (ICD10-I70.0). Electronically Signed   By: Donavan Foil M.D.   On: 10/16/2022 16:33   DG Chest 2 View  Result Date: 10/16/2022 CLINICAL DATA:  Chest pain and cough. EXAM: CHEST - 2 VIEW COMPARISON:  May 20, 2021 FINDINGS: Stable postsurgical changes. Calcific atherosclerotic disease and tortuosity of the aorta. The cardiac silhouette is mildly enlarged. Bilateral patchy areas of airspace consolidation, right worse than left. Right pleural effusion. Osseous structures are without acute abnormality. Soft tissues are grossly normal. IMPRESSION: 1. Bilateral patchy areas of airspace consolidation, right worse than left. 2. Right pleural effusion. Electronically Signed   By: Fidela Salisbury M.D.   On: 10/16/2022 14:33    EKG: I independently viewed the EKG done and my findings are as followed: ***   Assessment/Plan Present on Admission:  Multifocal pneumonia  Principal Problem:   Multifocal pneumonia   DVT prophylaxis: ***   Code Status: ***   Family Communication: ***   Disposition Plan: ***   Consults called: ***   Admission status: ***    Status is:  Inpatient {Inpatient:23812}   Kayleen Memos MD [facility] Pager [phone]  If 7PM-7AM, please contact night-coverage [email or web address] Password [facility]  10/16/2022, 7:43 PM

## 2022-10-16 NOTE — Plan of Care (Signed)
[facility]  Tenor Casa (DOB 21-Nov-2040 - MRN [id number])  Requesting EDP for Transfer: Dr. Deno Etienne  The patient is an 82 year old male with a past medical history significant for but not limited to chronic combined systolic and diastolic CHF with EF of 35 to 40% and grade 2 diastolic dysfunction, atrial fibrillation on anticoagulation as well as a history of aortic stenosis status post mechanical valve, hypertension arthritis, history of nephrolithiasis, history of pancreatitis, as well as other comorbidities who presented to the ED with several days of worsening cough and shortness of breath.  He had a cough for the last few weeks and was given a course of azithromycin and cough medication with no relief over the last few days he progressively gotten worse.  He has not had much fluid accumulation in his legs has been compliant with his medications.  Presented to the ED for the symptoms and is found to have a multifocal pneumonia and CHF and was given diuresis and started on antibiotics.  Had a new O2 requirement and he does not wear oxygen at home.  She is advanced heart failure team.  Shortness of breath is slowly improving but he still has some labored breathing so he was accepted to a progressive bed as an inpatient.    Further imaging of his chest and abdomen was done given his shortness of breath and because he had a hematoma on the abdomen.  Blood cultures have already been obtained.  Of note the patient called his outpatient cardiology clinic and they directed him to go to the ED given his hypotension and concern for recurrent afibrillation.  Cardiology has not been consulted by EDP.

## 2022-10-16 NOTE — ED Provider Notes (Signed)
Received patient in turnover from Dr. Laverta Baltimore.  Please see their note for further details of Hx, PE.  Briefly patient is a 82 y.o. male with a Shortness of Breath .  Going on for about 3 to 4 days.  Trial of outpatient antibiotics without significant improvement.  Coughing coughing up sputum.  Some signs initially concerning for CHF exacerbation.  Giving a bolus dose of Lasix.  Also given antibiotics.  Awaiting CT chest abdomen pelvis.  CT is resulted with likely multifocal pneumonia.  Will discuss with medicine for admission.Tyrone Nine, Linna Hoff, DO 10/16/22 1756

## 2022-10-17 DIAGNOSIS — I5043 Acute on chronic combined systolic (congestive) and diastolic (congestive) heart failure: Secondary | ICD-10-CM | POA: Diagnosis not present

## 2022-10-17 DIAGNOSIS — A419 Sepsis, unspecified organism: Secondary | ICD-10-CM | POA: Insufficient documentation

## 2022-10-17 DIAGNOSIS — N400 Enlarged prostate without lower urinary tract symptoms: Secondary | ICD-10-CM | POA: Insufficient documentation

## 2022-10-17 DIAGNOSIS — I4891 Unspecified atrial fibrillation: Secondary | ICD-10-CM | POA: Diagnosis not present

## 2022-10-17 DIAGNOSIS — J189 Pneumonia, unspecified organism: Secondary | ICD-10-CM | POA: Diagnosis not present

## 2022-10-17 LAB — RESPIRATORY PANEL BY PCR

## 2022-10-17 LAB — COMPREHENSIVE METABOLIC PANEL
ALT: 32 U/L (ref 0–44)
AST: 27 U/L (ref 15–41)
Albumin: 3.1 g/dL — ABNORMAL LOW (ref 3.5–5.0)
Alkaline Phosphatase: 76 U/L (ref 38–126)
Anion gap: 12 (ref 5–15)
BUN: 21 mg/dL (ref 8–23)
CO2: 22 mmol/L (ref 22–32)
Calcium: 8.6 mg/dL — ABNORMAL LOW (ref 8.9–10.3)
Chloride: 101 mmol/L (ref 98–111)
Creatinine, Ser: 1.15 mg/dL (ref 0.61–1.24)
GFR, Estimated: >60 mL/min (ref >60)
Glucose, Bld: 108 mg/dL — ABNORMAL HIGH (ref 70–99)
Potassium: 4.1 mmol/L (ref 3.5–5.1)
Sodium: 135 mmol/L (ref 135–145)
Total Bilirubin: 1.2 mg/dL (ref 0.3–1.2)
Total Protein: 5.9 g/dL — ABNORMAL LOW (ref 6.5–8.1)

## 2022-10-17 LAB — CBC
HCT: 40.6 % (ref 39.0–52.0)
Hemoglobin: 12.6 g/dL — ABNORMAL LOW (ref 13.0–17.0)
MCH: 19.5 pg — ABNORMAL LOW (ref 26.0–34.0)
MCHC: 31 g/dL (ref 30.0–36.0)
MCV: 62.8 fL — ABNORMAL LOW (ref 80.0–100.0)
Platelets: 250 10*3/uL (ref 150–400)
RBC: 6.47 MIL/uL — ABNORMAL HIGH (ref 4.22–5.81)
RDW: 18 % — ABNORMAL HIGH (ref 11.5–15.5)
WBC: 13.9 10*3/uL — ABNORMAL HIGH (ref 4.0–10.5)
nRBC: 0.2 % (ref 0.0–0.2)

## 2022-10-17 LAB — PROTIME-INR
INR: 3.6 — ABNORMAL HIGH (ref 0.8–1.2)
Prothrombin Time: 35.6 seconds — ABNORMAL HIGH (ref 11.4–15.2)

## 2022-10-17 LAB — PHOSPHORUS: Phosphorus: 3.9 mg/dL (ref 2.5–4.6)

## 2022-10-17 LAB — MAGNESIUM: Magnesium: 2.3 mg/dL (ref 1.7–2.4)

## 2022-10-17 LAB — STREP PNEUMONIAE URINARY ANTIGEN: Strep Pneumo Urinary Antigen: NEGATIVE

## 2022-10-17 LAB — PROCALCITONIN: Procalcitonin: <0.1 ng/mL

## 2022-10-17 MED ORDER — GUAIFENESIN 100 MG/5ML PO LIQD
5.0000 mL | ORAL | Status: DC | PRN
  Administered 2022-10-17 – 2022-10-19 (×5): 5 mL via ORAL
  Filled 2022-10-17 (×5): qty 10

## 2022-10-17 MED ORDER — ALBUTEROL SULFATE (2.5 MG/3ML) 0.083% IN NEBU
2.5000 mg | INHALATION_SOLUTION | RESPIRATORY_TRACT | Status: DC | PRN
  Administered 2022-10-17: 2.5 mg via RESPIRATORY_TRACT
  Filled 2022-10-17: qty 3

## 2022-10-17 MED ORDER — DOCUSATE SODIUM 100 MG PO CAPS
100.0000 mg | ORAL_CAPSULE | Freq: Two times a day (BID) | ORAL | Status: DC
  Administered 2022-10-17 – 2022-10-19 (×5): 100 mg via ORAL
  Filled 2022-10-17 (×5): qty 1

## 2022-10-17 MED ORDER — ALLOPURINOL 300 MG PO TABS
300.0000 mg | ORAL_TABLET | Freq: Every day | ORAL | Status: DC
  Administered 2022-10-17 – 2022-10-19 (×3): 300 mg via ORAL
  Filled 2022-10-17 (×3): qty 1

## 2022-10-17 MED ORDER — ASPIRIN 81 MG PO CHEW
81.0000 mg | CHEWABLE_TABLET | Freq: Every day | ORAL | Status: DC
  Administered 2022-10-17 – 2022-10-19 (×3): 81 mg via ORAL
  Filled 2022-10-17 (×3): qty 1

## 2022-10-17 MED ORDER — SODIUM CHLORIDE 0.9 % IV SOLN: INTRAVENOUS | Status: DC | PRN

## 2022-10-17 MED ORDER — FUROSEMIDE 10 MG/ML IJ SOLN: 40.0000 mg | Freq: Every day | INTRAMUSCULAR | Status: DC

## 2022-10-17 MED ORDER — FOLIC ACID 1 MG PO TABS
1.0000 mg | ORAL_TABLET | Freq: Every day | ORAL | Status: DC
  Administered 2022-10-17 – 2022-10-19 (×3): 1 mg via ORAL
  Filled 2022-10-17 (×3): qty 1

## 2022-10-17 MED ORDER — SPIRONOLACTONE 25 MG PO TABS
25.0000 mg | ORAL_TABLET | Freq: Every day | ORAL | Status: DC
  Administered 2022-10-17 – 2022-10-18 (×2): 25 mg via ORAL
  Filled 2022-10-17 (×2): qty 1

## 2022-10-17 MED ORDER — BENZONATATE 100 MG PO CAPS
200.0000 mg | ORAL_CAPSULE | Freq: Three times a day (TID) | ORAL | Status: DC | PRN
  Administered 2022-10-17 – 2022-10-18 (×3): 200 mg via ORAL
  Filled 2022-10-17 (×3): qty 2

## 2022-10-17 MED ORDER — TAMSULOSIN HCL 0.4 MG PO CAPS
0.4000 mg | ORAL_CAPSULE | ORAL | Status: DC
  Administered 2022-10-17 – 2022-10-19 (×2): 0.4 mg via ORAL
  Filled 2022-10-17 (×2): qty 1

## 2022-10-17 MED ORDER — ALBUTEROL SULFATE (2.5 MG/3ML) 0.083% IN NEBU
2.5000 mg | INHALATION_SOLUTION | Freq: Four times a day (QID) | RESPIRATORY_TRACT | Status: DC
  Administered 2022-10-17 – 2022-10-18 (×3): 2.5 mg via RESPIRATORY_TRACT
  Filled 2022-10-17 (×3): qty 3

## 2022-10-17 MED ORDER — COLCHICINE 0.6 MG PO TABS: 0.6000 mg | ORAL_TABLET | Freq: Every day | ORAL | Status: DC | PRN

## 2022-10-17 MED ORDER — DAPAGLIFLOZIN PROPANEDIOL 5 MG PO TABS
5.0000 mg | ORAL_TABLET | Freq: Every day | ORAL | Status: DC
  Administered 2022-10-17 – 2022-10-19 (×3): 5 mg via ORAL
  Filled 2022-10-17 (×3): qty 1

## 2022-10-17 MED ORDER — ORAL CARE MOUTH RINSE: 15.0000 mL | OROMUCOSAL | Status: DC | PRN

## 2022-10-17 MED ORDER — ALBUTEROL SULFATE HFA 108 (90 BASE) MCG/ACT IN AERS: 2.0000 | INHALATION_SPRAY | RESPIRATORY_TRACT | Status: DC | PRN

## 2022-10-17 MED ORDER — IPRATROPIUM-ALBUTEROL 0.5-2.5 (3) MG/3ML IN SOLN
3.0000 mL | RESPIRATORY_TRACT | Status: AC
  Administered 2022-10-17: 3 mL via RESPIRATORY_TRACT
  Filled 2022-10-17: qty 3

## 2022-10-17 MED ORDER — FUROSEMIDE 10 MG/ML IJ SOLN
40.0000 mg | Freq: Two times a day (BID) | INTRAMUSCULAR | Status: DC
  Administered 2022-10-17 – 2022-10-18 (×4): 40 mg via INTRAVENOUS
  Filled 2022-10-17 (×4): qty 4

## 2022-10-17 MED ORDER — HYDROCODONE-ACETAMINOPHEN 5-325 MG PO TABS
1.0000 | ORAL_TABLET | Freq: Four times a day (QID) | ORAL | Status: DC | PRN
  Administered 2022-10-17 – 2022-10-18 (×2): 1 via ORAL
  Filled 2022-10-17 (×2): qty 1

## 2022-10-17 MED ORDER — FESOTERODINE FUMARATE ER 4 MG PO TB24
4.0000 mg | ORAL_TABLET | Freq: Every day | ORAL | Status: DC
  Administered 2022-10-17 – 2022-10-19 (×3): 4 mg via ORAL
  Filled 2022-10-17 (×3): qty 1

## 2022-10-17 MED ORDER — METOPROLOL SUCCINATE ER 25 MG PO TB24
25.0000 mg | ORAL_TABLET | Freq: Every day | ORAL | Status: DC
  Administered 2022-10-17 – 2022-10-19 (×3): 25 mg via ORAL
  Filled 2022-10-17 (×3): qty 1

## 2022-10-17 NOTE — Progress Notes (Signed)
   10/17/22 1657  Assess: MEWS Score  Temp 97.8 F (36.6 C)  BP 102/79  MAP (mmHg) 88  Pulse Rate (!) 104  ECG Heart Rate (!) 104  Resp (!) 22  SpO2 98 %  O2 Device Nasal Cannula  O2 Flow Rate (L/min) 2 L/min  Assess: MEWS Score  MEWS Temp 0  MEWS Systolic 0  MEWS Pulse 1  MEWS RR 1  MEWS LOC 0  MEWS Score 2  MEWS Score Color Yellow  Assess: if the MEWS score is Yellow or Red  Were vital signs taken at a resting state? Yes  Focused Assessment Change from prior assessment (see assessment flowsheet)  Does the patient meet 2 or more of the SIRS criteria? Yes  Does the patient have a confirmed or suspected source of infection? Yes  Provider and Rapid Response Notified? No  MEWS guidelines implemented  Yes, yellow  Treat  MEWS Interventions Considered administering scheduled or prn medications/treatments as ordered  Take Vital Signs  Increase Vital Sign Frequency  Yellow: Q2hr x1, continue Q4hrs until patient remains green for 12hrs  Escalate  MEWS: Escalate Yellow: Discuss with charge nurse and consider notifying provider and/or RRT  Notify: Charge Nurse/RN  Name of Charge Nurse/RN Notified Leanor Rubenstein RN  Provider Notification  Provider Name/Title Dr. Olevia Bowens  Date Provider Notified 10/17/22  Time Provider Notified 1645  Method of Notification Page  Notification Reason Change in status  Provider response See new orders  Date of Provider Response 10/17/22  Time of Provider Response 1648  Assess: SIRS CRITERIA  SIRS Temperature  0  SIRS Pulse 1  SIRS Respirations  1  SIRS WBC 0  SIRS Score Sum  2

## 2022-10-17 NOTE — Evaluation (Signed)
Physical Therapy Evaluation Patient Details Name: Adrian Lambert MRN: [id number] DOB: 12-Aug-1940 Today's Date: 10/17/2022  History of Present Illness  82 y.o. male who presented with with complaints of shortness of breath and dry cough for a few days. PMH of G2DD, paroxysmal Afib on coumadin, aortic stenosis s/p mechanical valve replacement, nephrolithiasis, pancreatitis.  Clinical Impression  Pt presents with admitting diagnosis above. Pt was able to ambulate in hallway and navigate stairs independently. Pt presents at or near baseline mobility. Pt has no further acute PT needs and will be signing off. Re consult PT if mobility status changes.        Recommendations for follow up therapy are one component of a multi-disciplinary discharge planning process, led by the attending physician.  Recommendations may be updated based on patient status, additional functional criteria and insurance authorization.  Follow Up Recommendations       Assistance Recommended at Discharge PRN  Patient can return home with the following  Assist for transportation    Equipment Recommendations None recommended by PT  Recommendations for Other Services       Functional Status Assessment Patient has had a recent decline in their functional status and demonstrates the ability to make significant improvements in function in a reasonable and predictable amount of time.     Precautions / Restrictions Precautions Precautions: Fall Precaution Comments: monitor 02 and HR Restrictions Weight Bearing Restrictions: No      Mobility  Bed Mobility Overal bed mobility: Modified Independent Bed Mobility: Supine to Sit, Sit to Supine     Supine to sit: Modified independent (Device/Increase time), HOB elevated Sit to supine: Modified independent (Device/Increase time), HOB elevated   General bed mobility comments: HOB elevated    Transfers Overall transfer level: Needs assistance Equipment used:  None Transfers: Sit to/from Stand Sit to Stand: Supervision                Ambulation/Gait Ambulation/Gait assistance: Independent Gait Distance (Feet): 400 Feet Assistive device: None Gait Pattern/deviations: WFL(Within Functional Limits)   Gait velocity interpretation: >2.62 ft/sec, indicative of community ambulatory   General Gait Details: no LOB noted  Stairs Stairs: Yes Stairs assistance: Independent Stair Management: One rail Left, One rail Right, Step to pattern, Forwards Number of Stairs: 15 General stair comments: no LOB noted  Wheelchair Mobility    Modified Rankin (Stroke Patients Only)       Balance Overall balance assessment: No apparent balance deficits (not formally assessed)                                           Pertinent Vitals/Pain Pain Assessment Pain Assessment: No/denies pain    Home Living Family/patient expects to be discharged to:: Private residence Living Arrangements: Spouse/significant other;Children (1 son) Available Help at Discharge: Family;Available 24 hours/day Type of Home: House Home Access: Stairs to enter Entrance Stairs-Rails: Right;Left;Can reach both Entrance Stairs-Number of Steps: 2 Alternate Level Stairs-Number of Steps: 10 Home Layout: Two level;Able to live on main level with bedroom/bathroom Home Equipment: Rolling Walker (2 wheels);Cane - single point;Shower seat Additional Comments: Basement downstairs    Prior Function Prior Level of Function : Independent/Modified Independent;Driving;History of Falls (last six months)             Mobility Comments: Independent no AD ADLs Comments: Independent bADLs/IADLs     Hand Dominance   Dominant Hand: Right  Extremity/Trunk Assessment   Upper Extremity Assessment Upper Extremity Assessment: RUE deficits/detail RUE Deficits / Details: R shoulder limited in flexion, unable to go beyond 100 degrees AROM d/t prior injury RUE  Sensation: WNL RUE Coordination: WNL    Lower Extremity Assessment Lower Extremity Assessment: Overall WFL for tasks assessed    Cervical / Trunk Assessment Cervical / Trunk Assessment: Normal  Communication   Communication: No difficulties  Cognition Arousal/Alertness: Awake/alert Behavior During Therapy: WFL for tasks assessed/performed Overall Cognitive Status: Within Functional Limits for tasks assessed                                          General Comments General comments (skin integrity, edema, etc.): VSS on RA    Exercises     Assessment/Plan    PT Assessment Patient does not need any further PT services  PT Problem List         PT Treatment Interventions      PT Goals (Current goals can be found in the Care Plan section)       Frequency       Co-evaluation               AM-PAC PT "6 Clicks" Mobility  Outcome Measure Help needed turning from your back to your side while in a flat bed without using bedrails?: None Help needed moving from lying on your back to sitting on the side of a flat bed without using bedrails?: None Help needed moving to and from a bed to a chair (including a wheelchair)?: None Help needed standing up from a chair using your arms (e.g., wheelchair or bedside chair)?: None Help needed to walk in hospital room?: None Help needed climbing 3-5 steps with a railing? : None 6 Click Score: 24    End of Session Equipment Utilized During Treatment: Gait belt Activity Tolerance: Patient tolerated treatment well Patient left: in bed;with call bell/phone within reach Nurse Communication: Mobility status PT Visit Diagnosis: Other abnormalities of gait and mobility (R26.89)    Time: [id number] PT Time Calculation (min) (ACUTE ONLY): 16 min   Charges:   PT Evaluation $PT Eval Low Complexity: 1 Low PT Treatments $Gait Training: 8-22 mins        [name] Mattocks, PT, DPT Acute Rehab Services [id number]   Viann Shove 10/17/2022, 12:04 PM

## 2022-10-17 NOTE — Plan of Care (Signed)

## 2022-10-17 NOTE — Progress Notes (Signed)
ANTICOAGULATION CONSULT NOTE - Initial Consult  Pharmacy Consult for Warfarin  Indication: Atrial fibrillation, Mechanical AVR  Allergies  Allergen Reactions   Celecoxib Rash and Other (See Comments)    Celebrex    Patient Measurements: Height: 5\' 11"  (180.3 cm) Weight: 94.8 kg (208 lb 15.9 oz) IBW/kg (Calculated) : 75.3  Vital Signs: Temp: 98.5 F (36.9 C) (03/26 0002) Temp Source: Oral (03/26 0002) BP: 98/70 (03/26 0002) Pulse Rate: 97 (03/26 0002)  Labs: Recent Labs    10/16/22 1414  HGB 13.7  HCT 45.0  PLT 295  LABPROT 35.5*  INR 3.6*  CREATININE 1.12    Estimated Creatinine Clearance: 60.8 mL/min (by C-G formula based on SCr of 1.12 mg/dL).   Medical History: Past Medical History:  Diagnosis Date   Anticoagulant long-term use    Arthritis    "probably in my thumbs" (06/26/2012)   Bifascicular block    Chronic combined systolic and diastolic CHF (congestive heart failure) ([place])    a. 01/2016 Echo: EF 45-50%, gr2 DD, inflat AK (poor acoustic windows);  b. 07/2016 Echo: EF 25-30%, mild LVH, PASP 70mmHg (pt in Afib).   Complication of anesthesia    "wake up w/a start; hallucinations" (06/26/2012)   Critical Aortic Stenosis    a. 03/2003 s/p SJM #25 mech prosthetic AoV;  b. 07/2016 Echo: EF 25-30% (in setting of AF), AoV area 1.72 cm^2 (VTI), 1.75 cm^2 (Vmean).   Diverticulosis    Gouty arthritis    "have had it in both feet, ankles, right knee" (06/26/2012)   History of diverticulitis of colon    History of kidney stones    History of pancreatitis    Hypertension    Incomplete RBBB    PAF (paroxysmal atrial fibrillation) ([place])    a. CHA2DS2VASc = 4-->chronic coumadin in setting of mech AoV;  b. 07/2016 Recurrent AF RVR.     Assessment: 82 y/o M presents to the ED with shortness of breath and cough, on warfarin PTA for afib and mechanical AVR. INR is above goal at 3.6. Other labs ok.   Goal of Therapy:  INR 2-3 Monitor platelets by anticoagulation  protocol: Yes   Plan:  INR with AM labs to assess dosing needs  Narda Bonds, PharmD, BCPS Clinical Pharmacist Phone: [phone]

## 2022-10-17 NOTE — Progress Notes (Signed)
Heart Failure Navigator Progress Note  Assessed for Heart & Vascular TOC clinic readiness.  Patient does not meet criteria due to Advanced Heart Failure Team patient.   Navigator will sign off at this time.    Lorrane Mccay, BSN, RN Heart Failure Nurse Navigator Secure Chat Only   

## 2022-10-17 NOTE — Progress Notes (Signed)
[facility] PROGRESS NOTE    Progress Note  Adrian Lambert  [id number] DOB: 06/27/1941 DOA: 10/16/2022 PCP: Lawerance Cruel, MD     Brief Narrative:   Adrian Lambert is an 82 y.o. male past medical history of systolic heart failure with an EF of [id number] grade 2 diastolic heart failure, paroxysmal atrial fibrillation on Coumadin aortic stenosis status post mitral valve replacement comes into the ED complaining of shortness of breath and a dry cough that started a few days prior to admission, chest x-ray in the ED showed bilateral interstitial consolidation right greater than left, leukocytosis of 14 was hypoxic in the ED now on 2 L of oxygen.   Assessment/Plan:   Acute respiratory failure with hypoxia possibly due to acute decompensated systolic and diastolic heart failure  Pro-calcitonin is low yield, chest x-ray showed bilateral infiltrates CT scan of the abdomen pelvis shows small to moderate bilateral effusions with left greater than right infiltrates. BNP is elevated, neck veins are up. His weight back in October to November last year was 83 kg, on admission 94 kg. Blood cultures have been ordered. Urine strep pneumonia negative and Legionella pending.  SARS-CoV-2 and influenza PCR negative He was started on IV Rocephin and azithromycin. Check a respiratory panel. He relates orthopnea, for about a week. I believe there is more an acute heart failure exacerbation. Will start him on IV Lasix resume Aldactone.  Hold Entresto. Continue antibiotics for now will have low yield to discontinue antibiotics.   Essential hypertension: Resume home dose metoprolol continue to hold Entresto. Continue IV Lasix blood pressure seems to be well-controlled.  Paroxysmal atrial fibrillation/mitral valve replacement: Rate controlled continue Coumadin per pharmacy and continue metoprolol current dose. INR supratherapeutic, lower dose of Coumadin continue Coumadin per pharmacy.  Left  lower back bruising: Denies any trauma. CT scan of the abdomen pelvis was unrevealing. INR supratherapeutic.  Generalized weakness: PT OT out of bed to chair.  BPH: Continue Flomax. Continue monitor strict I's and O's   DVT prophylaxis: couamdin Family Communication:none Status is: Inpatient Remains inpatient appropriate because: Acute respiratory failure with hypoxia    Code Status:     Code Status Orders  (From admission, onward)           Start     Ordered   10/16/22 1944  Full code  Continuous       Question:  By:  Answer:  Consent: discussion documented in EHR   10/16/22 1943           Code Status History     Date Active Date Inactive Code Status Order ID Comments User Context   08/11/2021 0958 08/11/2021 1750 Full Code [id number]  Larey Dresser, MD Inpatient   11/28/2016 1249 11/29/2016 1408 Full Code [id number]  Constance Haw, MD Inpatient   08/15/2016 1407 08/29/2016 1638 Full Code [id number]  Rogelia Mire, NP ED         IV Access:   Peripheral IV   Procedures and diagnostic studies:   CT CHEST ABDOMEN PELVIS W CONTRAST  Result Date: 10/16/2022 CLINICAL DATA:  Sepsis with left flank bruising cough EXAM: CT CHEST, ABDOMEN, AND PELVIS WITH CONTRAST TECHNIQUE: Multidetector CT imaging of the chest, abdomen and pelvis was performed following the standard protocol during bolus administration of intravenous contrast. RADIATION DOSE REDUCTION: This exam was performed according to the departmental dose-optimization program which includes automated exposure control, adjustment of the mA and/or kV according to patient size and/or use of  iterative reconstruction technique. CONTRAST:  165mL OMNIPAQUE IOHEXOL 300 MG/ML  SOLN COMPARISON:  Chest x-ray 10/16/2022, MRI 12/15/2021 FINDINGS: CT CHEST FINDINGS Cardiovascular: Mild aortic atherosclerosis. 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 Maximum arch diameter of 3.2  cm previously 3.1 cm. No dissection. Status post aortic valve replacement. Borderline cardiomegaly. No pericardial effusion. Numerous enhancing collateral vessels at the right shoulder and chest wall, with possible stenosis of the right subclavian vein prior to its confluence with the jugular vein. Mediastinum/Nodes: Midline trachea. No thyroid mass. Borderline right paratracheal nodes measuring up to 11 mm. Esophagus within normal limits Lungs/Pleura: Small moderate bilateral effusions. Heterogeneous right greater than left ground-glass densities and irregular consolidations. No visible pneumothorax. Musculoskeletal: Post sternotomy changes. No acute osseous abnormality. CT ABDOMEN PELVIS FINDINGS Hepatobiliary: No focal liver abnormality is seen. Status post cholecystectomy. No biliary dilatation. Pancreas: Unremarkable. No pancreatic ductal dilatation or surrounding inflammatory changes. Spleen: Normal in size without focal abnormality. Adrenals/Urinary Tract: Adrenal glands are within normal limits. Bilateral renal cysts measuring up to 7 cm lower pole right kidney, no imaging follow-up is recommended. No hydronephrosis. The bladder is unremarkable Stomach/Bowel: Stomach is within normal limits. Appendix appears normal. No evidence of bowel wall thickening, distention, or inflammatory changes. Diverticular disease of the colon without acute inflammatory process. Vascular/Lymphatic: Mild aortic atherosclerosis. No aneurysm. No suspicious lymph nodes. Reproductive: Multiple prostate seeds. Other: Negative for pelvic effusion or free air. Small fat containing inguinal hernias. Small umbilical hernia containing fat and anterior wall of small bowel. There is edema within the subcutaneous fat surrounding the hernia but no inflammation within the hernia sac. Musculoskeletal: Multilevel degenerative changes. No acute osseous abnormality. IMPRESSION: 1. Small to moderate bilateral effusions. Heterogeneous right greater  than left ground-glass densities and irregular consolidations suspicious for multifocal pneumonia to include atypical or viral organisms. 2. No CT evidence for acute intra-abdominal or pelvic abnormality. Diverticular disease of the colon without acute inflammatory process. 3. Small umbilical hernia containing fat and anterior wall of small bowel. There is edema within the subcutaneous fat surrounding the hernia but no inflammation within the hernia sac. Correlate with direct inspection. 4. Numerous enhancing collateral vessels at the right shoulder and chest wall, with possible stenosis of the right central subclavian vein. 5. Aortic atherosclerosis. Aortic Atherosclerosis (ICD10-I70.0). Electronically Signed   By: Donavan Foil M.D.   On: 10/16/2022 16:33   DG Chest 2 View  Result Date: 10/16/2022 CLINICAL DATA:  Chest pain and cough. EXAM: CHEST - 2 VIEW COMPARISON:  May 20, 2021 FINDINGS: Stable postsurgical changes. Calcific atherosclerotic disease and tortuosity of the aorta. The cardiac silhouette is mildly enlarged. Bilateral patchy areas of airspace consolidation, right worse than left. Right pleural effusion. Osseous structures are without acute abnormality. Soft tissues are grossly normal. IMPRESSION: 1. Bilateral patchy areas of airspace consolidation, right worse than left. 2. Right pleural effusion. Electronically Signed   By: Fidela Salisbury M.D.   On: 10/16/2022 14:33     Medical Consultants:   None.   Subjective:    Adrian Lambert relates orthopnea still short of breath with a cough nonproductive cannot lay flat to sleep  Objective:    Vitals:   10/16/22 1908 10/16/22 2000 10/17/22 0002 10/17/22 0447  BP: (!) 116/99 120/80 98/70 109/81  Pulse: (!) 107 99 97 99  Resp:  18 19   Temp:  98.4 F (36.9 C) 98.5 F (36.9 C) 97.7 F (36.5 C)  TempSrc:  Oral Oral Oral  SpO2: 99% 97% 98% 93%  Weight:    94.2 kg  Height:       SpO2: 93 % O2 Flow Rate (L/min): 2  L/min   Intake/Output Summary (Last 24 hours) at 10/17/2022 0722 Last data filed at 10/17/2022 0007 Gross per 24 hour  Intake --  Output 2650 ml  Net -2650 ml   Filed Weights   10/16/22 1411 10/16/22 1907 10/17/22 0447  Weight: 85.3 kg 94.8 kg 94.2 kg    Exam: General exam: In no acute distress. Respiratory system: Good air movement and clear to auscultation. Cardiovascular system: S1 & S2 heard, RRR.  Positive JVD Gastrointestinal system: Abdomen is nondistended, soft and nontender.  Extremities: No lower extremity edema Skin: No rashes, lesions or ulcers Psychiatry: Judgement and insight appear normal. Mood & affect appropriate.    Data Reviewed:    Labs: Basic Metabolic Panel: Recent Labs  Lab 10/16/22 1414 10/17/22 0251  NA 131* 135  K 4.5 4.1  CL 100 101  CO2 21* 22  GLUCOSE 112* 108*  BUN 21 21  CREATININE 1.12 1.15  CALCIUM 8.7* 8.6*  MG  --  2.3  PHOS  --  3.9   GFR Estimated Creatinine Clearance: 59.1 mL/min (by C-G formula based on SCr of 1.15 mg/dL). Liver Function Tests: Recent Labs  Lab 10/16/22 1414 10/17/22 0251  AST 44* 27  ALT 38 32  ALKPHOS 83 76  BILITOT 1.9* 1.2  PROT 6.9 5.9*  ALBUMIN 3.7 3.1*   No results for input(s): "LIPASE", "AMYLASE" in the last 168 hours. No results for input(s): "AMMONIA" in the last 168 hours. Coagulation profile Recent Labs  Lab 10/16/22 1414 10/17/22 0251  INR 3.6* 3.6*   COVID-19 Labs  No results for input(s): "DDIMER", "FERRITIN", "LDH", "CRP" in the last 72 hours.  Lab Results  Component Value Date   [place] NEGATIVE 10/16/2022    CBC: Recent Labs  Lab 10/16/22 1414 10/17/22 0251  WBC 13.4* 13.9*  HGB 13.7 12.6*  HCT 45.0 40.6  MCV 63.2* 62.8*  PLT 295 250   Cardiac Enzymes: No results for input(s): "CKTOTAL", "CKMB", "CKMBINDEX", "TROPONINI" in the last 168 hours. BNP (last 3 results) No results for input(s): "PROBNP" in the last 8760 hours. CBG: No results for input(s):  "GLUCAP" in the last 168 hours. D-Dimer: No results for input(s): "DDIMER" in the last 72 hours. Hgb A1c: No results for input(s): "HGBA1C" in the last 72 hours. Lipid Profile: No results for input(s): "CHOL", "HDL", "LDLCALC", "TRIG", "CHOLHDL", "LDLDIRECT" in the last 72 hours. Thyroid function studies: No results for input(s): "TSH", "T4TOTAL", "T3FREE", "THYROIDAB" in the last 72 hours.  Invalid input(s): "FREET3" Anemia work up: No results for input(s): "VITAMINB12", "FOLATE", "FERRITIN", "TIBC", "IRON", "RETICCTPCT" in the last 72 hours. Sepsis Labs: Recent Labs  Lab 10/16/22 1414 10/16/22 1505 10/16/22 1907 10/17/22 0251  PROCALCITON  --   --   --  <0.10  WBC 13.4*  --   --  13.9*  LATICACIDVEN  --  1.5 1.4  --    Microbiology Recent Results (from the past 240 hour(s))  Resp panel by RT-PCR (RSV, Flu A&B, Covid) Anterior Nasal Swab     Status: None   Collection Time: 10/16/22  2:21 PM   Specimen: Anterior Nasal Swab  Result Value Ref Range Status   SARS Coronavirus 2 by RT PCR NEGATIVE NEGATIVE Final    Comment: (NOTE) SARS-CoV-2 target nucleic acids are NOT DETECTED.  The SARS-CoV-2 RNA is generally detectable  in upper respiratory specimens during the acute phase of infection. The lowest concentration of SARS-CoV-2 viral copies this assay can detect is 138 copies/mL. A negative result does not preclude SARS-Cov-2 infection and should not be used as the sole basis for treatment or other patient management decisions. A negative result may occur with  improper specimen collection/handling, submission of specimen other than nasopharyngeal swab, presence of viral mutation(s) within the areas targeted by this assay, and inadequate number of viral copies(<138 copies/mL). A negative result must be combined with clinical observations, patient history, and epidemiological information. The expected result is Negative.  Fact Sheet for Patients:   EntrepreneurPulse.com.au  Fact Sheet for Healthcare Providers:  IncredibleEmployment.be  This test is no t yet approved or cleared by the [place] FDA and  has been authorized for detection and/or diagnosis of SARS-CoV-2 by FDA under an Emergency Use Authorization (EUA). This EUA will remain  in effect (meaning this test can be used) for the duration of the COVID-19 declaration under Section 564(b)(1) of the Act, 21 U.S.C.section 360bbb-3(b)(1), unless the authorization is terminated  or revoked sooner.       Influenza A by PCR NEGATIVE NEGATIVE Final   Influenza B by PCR NEGATIVE NEGATIVE Final    Comment: (NOTE) The Xpert Xpress SARS-CoV-2/FLU/RSV plus assay is intended as an aid in the diagnosis of influenza from Nasopharyngeal swab specimens and should not be used as a sole basis for treatment. Nasal washings and aspirates are unacceptable for Xpert Xpress SARS-CoV-2/FLU/RSV testing.  Fact Sheet for Patients: EntrepreneurPulse.com.au  Fact Sheet for Healthcare Providers: IncredibleEmployment.be  This test is not yet approved or cleared by the [place] FDA and has been authorized for detection and/or diagnosis of SARS-CoV-2 by FDA under an Emergency Use Authorization (EUA). This EUA will remain in effect (meaning this test can be used) for the duration of the COVID-19 declaration under Section 564(b)(1) of the Act, 21 U.S.C. section 360bbb-3(b)(1), unless the authorization is terminated or revoked.     Resp Syncytial Virus by PCR NEGATIVE NEGATIVE Final    Comment: (NOTE) Fact Sheet for Patients: EntrepreneurPulse.com.au  Fact Sheet for Healthcare Providers: IncredibleEmployment.be  This test is not yet approved or cleared by the [place] FDA and has been authorized for detection and/or diagnosis of SARS-CoV-2 by FDA under an Emergency Use  Authorization (EUA). This EUA will remain in effect (meaning this test can be used) for the duration of the COVID-19 declaration under Section 564(b)(1) of the Act, 21 U.S.C. section 360bbb-3(b)(1), unless the authorization is terminated or revoked.  Performed at [facility], [address] [place], [place] [address]   Culture, blood (routine x 2)     Status: None (Preliminary result)   Collection Time: 10/16/22  2:41 PM   Specimen: BLOOD LEFT FOREARM  Result Value Ref Range Status   Specimen Description   Final    BLOOD LEFT FOREARM Performed at [facility], [address], [place], [place] [address]    Special Requests   Final    BOTTLES DRAWN AEROBIC AND ANAEROBIC Blood Culture adequate volume Performed at [facility], [address] [place], [place] [address]    Culture PENDING  Incomplete   Report Status PENDING  Incomplete     Medications:    folic acid  1 mg Oral Daily   furosemide  40 mg Intravenous Daily   tamsulosin  0.4 mg Oral QODAY   Continuous Infusions:  azithromycin Stopped (  10/16/22 1748)   cefTRIAXone (ROCEPHIN)  IV Stopped (10/16/22 1559)      LOS: 1 day   Charlynne Cousins  [facility]  10/17/2022, 7:22 AM

## 2022-10-17 NOTE — Progress Notes (Signed)
Urine collected and sent to lab.

## 2022-10-17 NOTE — Progress Notes (Signed)
Patient c/o feeling SOB, noted to have dry cough.  Oxygen sat 93% on room air, but increased WOB noted. Crackles heard bilateral bases lungs.  BP 96/75, HR low 100s. Patient placed on 2L [place] and Dr. Olevia Bowens notified. Advised to give 1800hr lasix now and order albuterol nebulizer.

## 2022-10-17 NOTE — Evaluation (Signed)
Occupational Therapy Evaluation Patient Details Name: Adrian Lambert MRN: [id number] DOB: 08-Aug-1940 Today's Date: 10/17/2022   History of Present Illness 82 y.o. male who presented with with complaints of shortness of breath and dry cough for a few days. PMH of G2DD, paroxysmal Afib on coumadin, aortic stenosis s/p mechanical valve replacement, nephrolithiasis, pancreatitis.   Clinical Impression   PTA, patient reports being independent in iADLs and functional mobility no AD, but has been having a little assistance with dressing d/t shoulder injury from a fall in December. Patient lives with spouse and son, who are available to provide 24/7 support and assist at home. Patient currently ambulating in the hall with Min guard to Supervision assist, some fatigue noted at end of session with an increase in HR to 116 bpm during ambulation. Educated patient on CHF precautions and patient verbalized understanding and reports weighing self daily, would benefit from continued reinforcement of education. Pt would benefit from skilled acute OT services while here to address above deficits and help transition to next level of care. Pt would benefit from Outpatient OT services to address deficits in RUE with functional performance.     Recommendations for follow up therapy are one component of a multi-disciplinary discharge planning process, led by the attending physician.  Recommendations may be updated based on patient status, additional functional criteria and insurance authorization.   Assistance Recommended at Discharge PRN  Patient can return home with the following A little help with bathing/dressing/bathroom;A little help with walking and/or transfers;Assistance with cooking/housework;Help with stairs or ramp for entrance    Functional Status Assessment  Patient has had a recent decline in their functional status and demonstrates the ability to make significant improvements in function in a  reasonable and predictable amount of time.  Equipment Recommendations  None recommended by OT    Recommendations for Other Services       Precautions / Restrictions Precautions Precautions: Fall Precaution Comments: monitor 02 and HR Restrictions Weight Bearing Restrictions: No      Mobility Bed Mobility Overal bed mobility: Needs Assistance Bed Mobility: Supine to Sit     Supine to sit: Supervision     General bed mobility comments: Use of bed rails as needed    Transfers Overall transfer level: Needs assistance Equipment used: None Transfers: Sit to/from Stand Sit to Stand: Min guard                  Balance Overall balance assessment: Needs assistance Sitting-balance support: Feet supported, No upper extremity supported Sitting balance-Leahy Scale: Good     Standing balance support: During functional activity, No upper extremity supported Standing balance-Leahy Scale: Good                             ADL either performed or assessed with clinical judgement   ADL Overall ADL's : Needs assistance/impaired Eating/Feeding: Independent   Grooming: Standing;Wash/dry face;Min guard   Upper Body Bathing: Standing;Min guard   Lower Body Bathing: Min guard;Sitting/lateral leans   Upper Body Dressing : Standing;Minimal assistance Upper Body Dressing Details (indicate cue type and reason): Pt reports he recieves a little assist from spouse to don/doff jackets d/t R shoulder injury Lower Body Dressing: Min guard;Sitting/lateral leans Lower Body Dressing Details (indicate cue type and reason): Increased time needed to don/doff socks. Presents a mild challenge d/t RUE deficits Toilet Transfer: Min guard;Ambulation Toilet Transfer Details (indicate cue type and reason): no AD Toileting- Clothing Manipulation  and Hygiene: Minimal assistance;Sit to/from stand   Tub/ Shower Transfer: Min guard;Ambulation   Functional mobility during ADLs: Min  guard General ADL Comments: Pt progressed to close supervision no AD during functional ambulation but min guard assist provided for patient safety once they began to fatigue     Vision Baseline Vision/History: 0 No visual deficits Patient Visual Report: No change from baseline       Perception     Praxis      Pertinent Vitals/Pain Pain Assessment Pain Assessment: No/denies pain     Hand Dominance Right   Extremity/Trunk Assessment Upper Extremity Assessment Upper Extremity Assessment: RUE deficits/detail (L shoulder WFL 4+/5) RUE Deficits / Details: R shoulder limited in flexion, unable to go beyond 100 degrees AROM d/t prior injury   Lower Extremity Assessment Lower Extremity Assessment: Defer to PT evaluation       Communication Communication Communication: No difficulties   Cognition Arousal/Alertness: Awake/alert Behavior During Therapy: WFL for tasks assessed/performed Overall Cognitive Status: Within Functional Limits for tasks assessed                                       General Comments  Sp02 during functional ambulation lingered around 92% on RA, returned to 95% once seated EOB. HR increased to 116 bpm during functional ambulation, continued to monitor but it never increased beyond that point. BP seated EOB end of session 106/79    Exercises     Shoulder Instructions      Home Living Family/patient expects to be discharged to:: Private residence Living Arrangements: Spouse/significant other;Children (wife and home) Available Help at Discharge: Family;Available 24 hours/day Type of Home: House Home Access: Stairs to enter [employer] of Steps: 2 Entrance Stairs-Rails: Right;Left;Can reach both Home Layout: Two level;Able to live on main level with bedroom/bathroom Alternate Level Stairs-Number of Steps: 10 Alternate Level Stairs-Rails: Left Bathroom Shower/Tub: Occupational psychologist: Standard Bathroom  Accessibility: Yes How Accessible: Accessible via walker Home Equipment: [place] (2 wheels);Cane - single point;Shower seat   Additional Comments: Basement downstairs      Prior Functioning/Environment Prior Level of Function : Independent/Modified Independent;Driving;History of Falls (last six months) (1 fall in the last 6 fall, dog pulled leash and Pt fell off steps)             Mobility Comments: Independent no AD ADLs Comments: Independent bADLs/IADLs        OT Problem List: Impaired balance (sitting and/or standing);Decreased activity tolerance;Cardiopulmonary status limiting activity      OT Treatment/Interventions: Energy conservation;Self-care/ADL training;Balance training;Therapeutic activities;Patient/family education;Therapeutic exercise;DME and/or AE instruction    OT Goals(Current goals can be found in the care plan section) Acute Rehab OT Goals Patient Stated Goal: Go home OT Goal Formulation: With patient Time For Goal Achievement: 10/31/22 Potential to Achieve Goals: Good ADL Goals Pt Will Perform Grooming: standing;with supervision (Standing at sink >5 mins) Pt Will Transfer to Toilet: with supervision Additional ADL Goal #1: Patient will identify 4 signs/symptoms of CHF what to do in the event of an emergency Additional ADL Goal #2: Pt will gather clothing and complete dressing tasks using energy conservation techniques prn with Supervision  OT Frequency: Min 2X/week    Co-evaluation              AM-PAC OT "6 Clicks" Daily Activity     Outcome Measure Help from another person eating meals?:  None Help from another person taking care of personal grooming?: A Little Help from another person toileting, which includes using toliet, bedpan, or urinal?: A Little Help from another person bathing (including washing, rinsing, drying)?: A Little Help from another person to put on and taking off regular upper body clothing?: A Little Help from another  person to put on and taking off regular lower body clothing?: A Little 6 Click Score: 19   End of Session Equipment Utilized During Treatment: Gait belt Nurse Communication: Mobility status  Activity Tolerance: Patient tolerated treatment well Patient left: in bed;with call bell/phone within reach [facility] nurse student at bedside)  OT Visit Diagnosis: Unsteadiness on feet (R26.81);Other abnormalities of gait and mobility (R26.89)                Time: [id number] OT Time Calculation (min): 29 min Charges:  OT General Charges $OT Visit: 1 Visit OT Evaluation $OT Eval Moderate Complexity: 1 Mod OT Treatments $Therapeutic Activity: 8-22 mins  10/17/2022  AB, OTR/L  Acute Rehabilitation Services  Office: [phone]   Cori Razor 10/17/2022, 9:24 AM

## 2022-10-18 DIAGNOSIS — A419 Sepsis, unspecified organism: Secondary | ICD-10-CM

## 2022-10-18 DIAGNOSIS — I10 Essential (primary) hypertension: Secondary | ICD-10-CM

## 2022-10-18 DIAGNOSIS — I5043 Acute on chronic combined systolic (congestive) and diastolic (congestive) heart failure: Secondary | ICD-10-CM | POA: Diagnosis not present

## 2022-10-18 DIAGNOSIS — Z952 Presence of prosthetic heart valve: Secondary | ICD-10-CM | POA: Diagnosis not present

## 2022-10-18 DIAGNOSIS — I480 Paroxysmal atrial fibrillation: Secondary | ICD-10-CM

## 2022-10-18 DIAGNOSIS — N400 Enlarged prostate without lower urinary tract symptoms: Secondary | ICD-10-CM

## 2022-10-18 DIAGNOSIS — J189 Pneumonia, unspecified organism: Secondary | ICD-10-CM | POA: Diagnosis not present

## 2022-10-18 DIAGNOSIS — C61 Malignant neoplasm of prostate: Secondary | ICD-10-CM

## 2022-10-18 DIAGNOSIS — J310 Chronic rhinitis: Secondary | ICD-10-CM

## 2022-10-18 LAB — RENAL FUNCTION PANEL
Albumin: 3 g/dL — ABNORMAL LOW (ref 3.5–5.0)
Anion gap: 12 (ref 5–15)
BUN: 25 mg/dL — ABNORMAL HIGH (ref 8–23)
CO2: 23 mmol/L (ref 22–32)
Calcium: 8.7 mg/dL — ABNORMAL LOW (ref 8.9–10.3)
Chloride: 99 mmol/L (ref 98–111)
Creatinine, Ser: 1.22 mg/dL (ref 0.61–1.24)
GFR, Estimated: 60 mL/min — ABNORMAL LOW (ref >60)
Glucose, Bld: 117 mg/dL — ABNORMAL HIGH (ref 70–99)
Phosphorus: 6 mg/dL — ABNORMAL HIGH (ref 2.5–4.6)
Potassium: 4.1 mmol/L (ref 3.5–5.1)
Sodium: 134 mmol/L — ABNORMAL LOW (ref 135–145)

## 2022-10-18 LAB — CBC
HCT: 40.3 % (ref 39.0–52.0)
Hemoglobin: 12.7 g/dL — ABNORMAL LOW (ref 13.0–17.0)
MCH: 19.4 pg — ABNORMAL LOW (ref 26.0–34.0)
MCHC: 31.5 g/dL (ref 30.0–36.0)
MCV: 61.6 fL — ABNORMAL LOW (ref 80.0–100.0)
Platelets: 233 10*3/uL (ref 150–400)
RBC: 6.54 MIL/uL — ABNORMAL HIGH (ref 4.22–5.81)
RDW: 18 % — ABNORMAL HIGH (ref 11.5–15.5)
WBC: 12 10*3/uL — ABNORMAL HIGH (ref 4.0–10.5)
nRBC: 0.2 % (ref 0.0–0.2)

## 2022-10-18 LAB — PROCALCITONIN: Procalcitonin: <0.1 ng/mL

## 2022-10-18 LAB — MAGNESIUM: Magnesium: 2.3 mg/dL (ref 1.7–2.4)

## 2022-10-18 LAB — PROTIME-INR
INR: 3.1 — ABNORMAL HIGH (ref 0.8–1.2)
Prothrombin Time: 31.7 seconds — ABNORMAL HIGH (ref 11.4–15.2)

## 2022-10-18 MED ORDER — ALBUTEROL SULFATE (2.5 MG/3ML) 0.083% IN NEBU
2.5000 mg | INHALATION_SOLUTION | Freq: Three times a day (TID) | RESPIRATORY_TRACT | Status: DC
  Administered 2022-10-18 – 2022-10-19 (×3): 2.5 mg via RESPIRATORY_TRACT
  Filled 2022-10-18 (×3): qty 3

## 2022-10-18 MED ORDER — WARFARIN - PHARMACIST DOSING INPATIENT: Freq: Every day | Status: DC

## 2022-10-18 MED ORDER — POLYETHYLENE GLYCOL 3350 17 G PO PACK
17.0000 g | PACK | Freq: Two times a day (BID) | ORAL | Status: DC | PRN
  Administered 2022-10-18: 17 g via ORAL
  Filled 2022-10-18: qty 1

## 2022-10-18 MED ORDER — WARFARIN SODIUM 2 MG PO TABS
4.0000 mg | ORAL_TABLET | Freq: Once | ORAL | Status: AC
  Administered 2022-10-18: 4 mg via ORAL
  Filled 2022-10-18: qty 2

## 2022-10-18 MED ORDER — SENNOSIDES-DOCUSATE SODIUM 8.6-50 MG PO TABS: 1.0000 | ORAL_TABLET | Freq: Two times a day (BID) | ORAL | Status: DC | PRN

## 2022-10-18 NOTE — Progress Notes (Signed)
Mobility Specialist Progress Note:   10/18/22 0945  Mobility  Activity Ambulated independently in hallway;Ambulated with assistance in hallway  Level of Assistance Standby assist, set-up cues, supervision of patient - no hands on  Assistive Device None  Distance Ambulated (ft) 400 ft  Activity Response Tolerated well  Mobility Referral Yes  $Mobility charge 1 Mobility   Pt agreeable to mobility session. Required no physical assistance throughout. Ambulated on RA, SpO2 87-92%. Recovered with PLB and standing rest. Will use O2 next session to determine need.   Nelta Numbers Mobility Specialist Please contact via SecureChat or  Rehab office at [phone]

## 2022-10-18 NOTE — Progress Notes (Signed)
PROGRESS NOTE  Adrian Lambert [id number] DOB: 1941/01/01   PCP: Lawerance Cruel, MD  Patient is from: Home.  Lives with his wife and son.  Independently ambulates at baseline.  DOA: 10/16/2022 LOS: 2  Chief complaints Chief Complaint  Patient presents with   Shortness of Breath     Brief Narrative / Interim history: 82 year old M with PMH of combined CHF, mechanical AVR and PAF on warfarin and BPH presenting with SOB and dry cough and admitted for acute on chronic combined CHF and multifocal pneumonia, and started on antibiotics and IV diuretics.  CXR showed bilateral infiltrates.  CT abdomen and pelvis with a small to moderate bilateral effusion.  BNP elevated.  Had a JVD on presentation.  Slowly improving.    Subjective: Seen and examined earlier this morning.  Reports feeling "better" from breathing standpoint.  Still with dry cough.  He is in A-fib with mild RVR.  Denies chest pain, GI or UTI symptoms.  Objective: Vitals:   10/18/22 0730 10/18/22 0745 10/18/22 1622 10/18/22 1659  BP:  95/65 93/63   Pulse:  96 (!) 108 96  Resp:  20 20   Temp:  98 F (36.7 C) 97.9 F (36.6 C)   TempSrc:  Oral Axillary   SpO2: 99% 98% 95%   Weight:      Height:        Examination:  GENERAL: No apparent distress.  Nontoxic. HEENT: MMM.  Vision and hearing grossly intact.  NECK: Supple.  No apparent JVD.  RESP:  No IWOB.  Fair aeration bilaterally.  Bibasilar crackles. CVS: Irregular rhythm.  HR in 100s.  Mechanical heart sound. ABD/GI/GU: BS+. Abd soft, NTND.  MSK/EXT:  Moves extremities. No apparent deformity. No edema.  SKIN: no apparent skin lesion or wound NEURO: Awake, alert and oriented appropriately.  No apparent focal neuro deficit. PSYCH: Calm. Normal affect.   Procedures:  None  Microbiology summarized: [id number], influenza, RSV and full RVP nonreactive Blood cultures NGTD  Assessment and plan: Principal Problem:   Acute on chronic combined systolic and  diastolic CHF, NYHA class 3 (HCC) Active Problems:   Essential hypertension   Atrial fibrillation (HCC)   Multifocal pneumonia   Sepsis (HCC)   BPH (benign prostatic hyperplasia)  Acute on chronic combined CHF: TTE in 07/2021 with LVEF of 35 to 40%, GH, indeterminate DD, mod MR and RVSP of 51 mmHg.  Reportedly had JVD on presentation.  Has bibasilar crackles on exam.  Started on IV Lasix.  Net 3.4 L.  BP and Cr stable. -Continue IV Lasix 40 mg twice daily -Continue Toprol-XL, Jardiance and Aldactone -Continue holding Entresto. -Strict INO, daily weight, renal functions and electrolytes  Sepsis due to multifocal pneumonia: CXR with bilateral infiltrates.  Has respiratory symptoms and lactic acidosis.  Pro-Cal negative. -Continue ceftriaxone and azithromycin -Mucolytic's and antitussive   Essential hypertension: Normotensive. -Cardiac meds as above -Continue warfarin for anticoagulation.  Mechanical aortic valve: INR 3.1 -Continue warfarin per pharmacy  Paroxysmal A-fib: HR in 90s and 100s. -Continue Toprol-XL as above -Continue warfarin   Left lower back bruising: Denies any trauma.  Likely from anticoagulation.  CT abdomen and pelvis unrevealing.  Generalized weakness: -PT/OT  BPH: Continue Flomax.  Acute hypoxic respiratory failure ruled out.  No documented desaturation. -Wean oxygen, incentive spirometry, OOB/PT/OT -Ambulatory saturation  Body mass index is 28.87 kg/m.           DVT prophylaxis:  On full dose anticoagulation  Code Status: Full code Family  Communication: None at bedside Level of care: Progressive Status is: Inpatient Remains inpatient appropriate because: Acute CHF and pneumonia   Final disposition: Home Consultants:  None  35 minutes with more than 50% spent in reviewing records, counseling patient/family and coordinating care.   Sch Meds:  Scheduled Meds:  albuterol  2.5 mg Nebulization TID   allopurinol  300 mg Oral Daily    aspirin  81 mg Oral Daily   dapagliflozin propanediol  5 mg Oral Daily   docusate sodium  100 mg Oral BID   fesoterodine  4 mg Oral Daily   folic acid  1 mg Oral Daily   furosemide  40 mg Intravenous BID   metoprolol succinate  25 mg Oral Daily   spironolactone  25 mg Oral Daily   tamsulosin  0.4 mg Oral QODAY   Warfarin - Pharmacist Dosing Inpatient   Does not apply q1600   Continuous Infusions:  sodium chloride Stopped (10/17/22 1543)   azithromycin 500 mg (10/18/22 1542)   cefTRIAXone (ROCEPHIN)  IV 2 g (10/18/22 1453)   PRN Meds:.sodium chloride, albuterol, benzonatate, colchicine, guaiFENesin, HYDROcodone-acetaminophen, mouth rinse  Antimicrobials: Anti-infectives (From admission, onward)    Start     Dose/Rate Route Frequency Ordered Stop   10/16/22 1445  cefTRIAXone (ROCEPHIN) 2 g in sodium chloride 0.9 % 100 mL IVPB        2 g 200 mL/hr over 30 Minutes Intravenous Every 24 hours 10/16/22 1441 10/21/22 1444   10/16/22 1445  azithromycin (ZITHROMAX) 500 mg in sodium chloride 0.9 % 250 mL IVPB        500 mg 250 mL/hr over 60 Minutes Intravenous Every 24 hours 10/16/22 1441 10/21/22 1444        I have personally reviewed the following labs and images: CBC: Recent Labs  Lab 10/16/22 1414 10/17/22 0251 10/18/22 0240  WBC 13.4* 13.9* 12.0*  HGB 13.7 12.6* 12.7*  HCT 45.0 40.6 40.3  MCV 63.2* 62.8* 61.6*  PLT 295 250 233   BMP &GFR Recent Labs  Lab 10/16/22 1414 10/17/22 0251 10/18/22 0240  NA 131* 135 134*  K 4.5 4.1 4.1  CL 100 101 99  CO2 21* 22 23  GLUCOSE 112* 108* 117*  BUN 21 21 25*  CREATININE 1.12 1.15 1.22  CALCIUM 8.7* 8.6* 8.7*  MG  --  2.3 2.3  PHOS  --  3.9 6.0*   Estimated Creatinine Clearance: 55.5 mL/min (by C-G formula based on SCr of 1.22 mg/dL). Liver & Pancreas: Recent Labs  Lab 10/16/22 1414 10/17/22 0251 10/18/22 0240  AST 44* 27  --   ALT 38 32  --   ALKPHOS 83 76  --   BILITOT 1.9* 1.2  --   PROT 6.9 5.9*  --    ALBUMIN 3.7 3.1* 3.0*   No results for input(s): "LIPASE", "AMYLASE" in the last 168 hours. No results for input(s): "AMMONIA" in the last 168 hours. Diabetic: No results for input(s): "HGBA1C" in the last 72 hours. No results for input(s): "GLUCAP" in the last 168 hours. Cardiac Enzymes: No results for input(s): "CKTOTAL", "CKMB", "CKMBINDEX", "TROPONINI" in the last 168 hours. No results for input(s): "PROBNP" in the last 8760 hours. Coagulation Profile: Recent Labs  Lab 10/16/22 1414 10/17/22 0251 10/18/22 0240  INR 3.6* 3.6* 3.1*   Thyroid Function Tests: No results for input(s): "TSH", "T4TOTAL", "FREET4", "T3FREE", "THYROIDAB" in the last 72 hours. Lipid Profile: No results for input(s): "CHOL", "HDL", "LDLCALC", "TRIG", "CHOLHDL", "LDLDIRECT" in the  last 72 hours. Anemia Panel: No results for input(s): "VITAMINB12", "FOLATE", "FERRITIN", "TIBC", "IRON", "RETICCTPCT" in the last 72 hours. Urine analysis:    Component Value Date/Time   COLORURINE YELLOW 10/18/2016 1330   APPEARANCEUR CLEAR 10/18/2016 1330   LABSPEC 1.010 10/18/2016 1330   PHURINE 5.0 10/18/2016 1330   GLUCOSEU NEGATIVE 10/18/2016 1330   HGBUR NEGATIVE 10/18/2016 1330   BILIRUBINUR negative 07/05/2022 0000   KETONESUR NEGATIVE 10/18/2016 1330   PROTEINUR Negative 07/05/2022 0000   PROTEINUR NEGATIVE 10/18/2016 1330   UROBILINOGEN 0.2 07/05/2022 0000   NITRITE negative 07/05/2022 0000   NITRITE NEGATIVE 10/18/2016 1330   LEUKOCYTESUR Negative 07/05/2022 0000   Sepsis Labs: Invalid input(s): "PROCALCITONIN", "LACTICIDVEN"  Microbiology: Recent Results (from the past 240 hour(s))  Resp panel by RT-PCR (RSV, Flu A&B, Covid) Anterior Nasal Swab     Status: None   Collection Time: 10/16/22  2:21 PM   Specimen: Anterior Nasal Swab  Result Value Ref Range Status   SARS Coronavirus 2 by RT PCR NEGATIVE NEGATIVE Final    Comment: (NOTE) SARS-CoV-2 target nucleic acids are NOT DETECTED.  The  SARS-CoV-2 RNA is generally detectable in upper respiratory specimens during the acute phase of infection. The lowest concentration of SARS-CoV-2 viral copies this assay can detect is 138 copies/mL. A negative result does not preclude SARS-Cov-2 infection and should not be used as the sole basis for treatment or other patient management decisions. A negative result may occur with  improper specimen collection/handling, submission of specimen other than nasopharyngeal swab, presence of viral mutation(s) within the areas targeted by this assay, and inadequate number of viral copies(<138 copies/mL). A negative result must be combined with clinical observations, patient history, and epidemiological information. The expected result is Negative.  Fact Sheet for Patients:  EntrepreneurPulse.com.au  Fact Sheet for Healthcare Providers:  IncredibleEmployment.be  This test is no t yet approved or cleared by the [place] FDA and  has been authorized for detection and/or diagnosis of SARS-CoV-2 by FDA under an Emergency Use Authorization (EUA). This EUA will remain  in effect (meaning this test can be used) for the duration of the COVID-19 declaration under Section 564(b)(1) of the Act, 21 U.S.C.section 360bbb-3(b)(1), unless the authorization is terminated  or revoked sooner.       Influenza A by PCR NEGATIVE NEGATIVE Final   Influenza B by PCR NEGATIVE NEGATIVE Final    Comment: (NOTE) The Xpert Xpress SARS-CoV-2/FLU/RSV plus assay is intended as an aid in the diagnosis of influenza from Nasopharyngeal swab specimens and should not be used as a sole basis for treatment. Nasal washings and aspirates are unacceptable for Xpert Xpress SARS-CoV-2/FLU/RSV testing.  Fact Sheet for Patients: EntrepreneurPulse.com.au  Fact Sheet for Healthcare Providers: IncredibleEmployment.be  This test is not yet approved or  cleared by the [place] FDA and has been authorized for detection and/or diagnosis of SARS-CoV-2 by FDA under an Emergency Use Authorization (EUA). This EUA will remain in effect (meaning this test can be used) for the duration of the COVID-19 declaration under Section 564(b)(1) of the Act, 21 U.S.C. section 360bbb-3(b)(1), unless the authorization is terminated or revoked.     Resp Syncytial Virus by PCR NEGATIVE NEGATIVE Final    Comment: (NOTE) Fact Sheet for Patients: EntrepreneurPulse.com.au  Fact Sheet for Healthcare Providers: IncredibleEmployment.be  This test is not yet approved or cleared by the [place] FDA and has been authorized for detection and/or diagnosis of SARS-CoV-2 by FDA under an Emergency Use Authorization (EUA). This  EUA will remain in effect (meaning this test can be used) for the duration of the COVID-19 declaration under Section 564(b)(1) of the Act, 21 U.S.C. section 360bbb-3(b)(1), unless the authorization is terminated or revoked.  Performed at [facility], [address] [place], [place] [address]   Culture, blood (routine x 2)     Status: None (Preliminary result)   Collection Time: 10/16/22  2:41 PM   Specimen: BLOOD LEFT FOREARM  Result Value Ref Range Status   Specimen Description   Final    BLOOD LEFT FOREARM Performed at [facility], [place] [address], [place], [place] [address]    Special Requests   Final    BOTTLES DRAWN AEROBIC AND ANAEROBIC Blood Culture adequate volume Performed at [facility], [address] [place], [place] [address]    Culture   Final    NO GROWTH 2 DAYS Performed at [facility], [place] [address], [place], [place] [address]    Report Status PENDING  Incomplete  Culture, blood (routine x 2)     Status: None (Preliminary result)   Collection Time: 10/16/22  2:46 PM   Specimen: BLOOD  Result Value Ref Range Status   Specimen  Description   Final    BLOOD RIGHT ANTECUBITAL Performed at [facility], [address] [place], [place] [address]    Special Requests   Final    BOTTLES DRAWN AEROBIC AND ANAEROBIC Blood Culture adequate volume Performed at [facility], [address] [place], [place] [address]    Culture   Final    NO GROWTH 2 DAYS Performed at [facility], [place] [address], [place], [place] [address]    Report Status PENDING  Incomplete  Respiratory (~20 pathogens) panel by PCR     Status: None   Collection Time: 10/17/22  7:38 AM   Specimen: Nasopharyngeal Swab; Respiratory  Result Value Ref Range Status   Adenovirus NOT DETECTED NOT DETECTED Final   Coronavirus 229E NOT DETECTED NOT DETECTED Final    Comment: (NOTE) The Coronavirus on the Respiratory Panel, DOES NOT test for the novel  Coronavirus (2019 nCoV)    Coronavirus HKU1 NOT DETECTED NOT DETECTED Final   Coronavirus NL63 NOT DETECTED NOT DETECTED Final   Coronavirus OC43 NOT DETECTED NOT DETECTED Final   Metapneumovirus NOT DETECTED NOT DETECTED Final   Rhinovirus / Enterovirus NOT DETECTED NOT DETECTED Final   Influenza A NOT DETECTED NOT DETECTED Final   Influenza B NOT DETECTED NOT DETECTED Final   Parainfluenza Virus 1 NOT DETECTED NOT DETECTED Final   Parainfluenza Virus 2 NOT DETECTED NOT DETECTED Final   Parainfluenza Virus 3 NOT DETECTED NOT DETECTED Final   Parainfluenza Virus 4 NOT DETECTED NOT DETECTED Final   Respiratory Syncytial Virus NOT DETECTED NOT DETECTED Final   Bordetella pertussis NOT DETECTED NOT DETECTED Final   Bordetella Parapertussis NOT DETECTED NOT DETECTED Final   Chlamydophila pneumoniae NOT DETECTED NOT DETECTED Final   Mycoplasma pneumoniae NOT DETECTED NOT DETECTED Final    Comment: Performed at [facility], [place]. [address], [place], [place] [address]    Radiology Studies: No results found.    Adrian Lambert T. [place]  If 7PM-7AM, please  contact night-coverage [email or web address] 10/18/2022, 5:15 PM

## 2022-10-18 NOTE — Progress Notes (Signed)
ANTICOAGULATION CONSULT NOTE -   Pharmacy Consult for Warfarin  Indication: Atrial fibrillation, Mechanical AVR  Allergies  Allergen Reactions   Celecoxib Rash and Other (See Comments)    Celebrex    Patient Measurements: Height: 5\' 11"  (180.3 cm) Weight: 93.9 kg (207 lb 0.2 oz) IBW/kg (Calculated) : 75.3  Vital Signs: Temp: 98 F (36.7 C) (03/27 0745) Temp Source: Oral (03/27 0745) BP: 95/65 (03/27 0745) Pulse Rate: 96 (03/27 0745)  Labs: Recent Labs    10/16/22 1414 10/17/22 0251 10/18/22 0240  HGB 13.7 12.6*  --   HCT 45.0 40.6  --   PLT 295 250  --   LABPROT 35.5* 35.6* 31.7*  INR 3.6* 3.6* 3.1*  CREATININE 1.12 1.15  --      Estimated Creatinine Clearance: 58.9 mL/min (by C-G formula based on SCr of 1.15 mg/dL).   Medical History: Past Medical History:  Diagnosis Date   Anticoagulant long-term use    Arthritis    "probably in my thumbs" (06/26/2012)   Bifascicular block    Chronic combined systolic and diastolic CHF (congestive heart failure) ([place])    a. 01/2016 Echo: EF 45-50%, gr2 DD, inflat AK (poor acoustic windows);  b. 07/2016 Echo: EF 25-30%, mild LVH, PASP 23mmHg (pt in Afib).   Complication of anesthesia    "wake up w/a start; hallucinations" (06/26/2012)   Critical Aortic Stenosis    a. 03/2003 s/p SJM #25 mech prosthetic AoV;  b. 07/2016 Echo: EF 25-30% (in setting of AF), AoV area 1.72 cm^2 (VTI), 1.75 cm^2 (Vmean).   Diverticulosis    Gouty arthritis    "have had it in both feet, ankles, right knee" (06/26/2012)   History of diverticulitis of colon    History of kidney stones    History of pancreatitis    Hypertension    Incomplete RBBB    PAF (paroxysmal atrial fibrillation) ([place])    a. CHA2DS2VASc = 4-->chronic coumadin in setting of mech AoV;  b. 07/2016 Recurrent AF RVR.     Assessment: 82 y/o M presents to the ED with shortness of breath and cough, on warfarin PTA for afib and mechanical AVR.   -INR= 3.1  He does not know his home  warfarin regimen. His PCP manages his warfarin and last dose was reported as  -4mg  five days if the week and, 2.5mg  for two days of the week  (it is unclear which days he takes 2.5mg ).  Goal of Therapy:  INR 2-3 Monitor platelets by anticoagulation protocol: Yes   Plan:  -Warfarin 4mg  x1 today -Daily INR  Hildred Laser, PharmD Clinical Pharmacist **Pharmacist phone directory can now be found on [email or web address] ([email or web address]).  Listed under [place].

## 2022-10-18 NOTE — Plan of Care (Signed)

## 2022-10-19 ENCOUNTER — Other Ambulatory Visit (HOSPITAL_COMMUNITY): Payer: Self-pay

## 2022-10-19 DIAGNOSIS — I5043 Acute on chronic combined systolic (congestive) and diastolic (congestive) heart failure: Secondary | ICD-10-CM | POA: Diagnosis not present

## 2022-10-19 DIAGNOSIS — I480 Paroxysmal atrial fibrillation: Secondary | ICD-10-CM | POA: Diagnosis not present

## 2022-10-19 DIAGNOSIS — J189 Pneumonia, unspecified organism: Secondary | ICD-10-CM | POA: Diagnosis not present

## 2022-10-19 DIAGNOSIS — A419 Sepsis, unspecified organism: Secondary | ICD-10-CM | POA: Diagnosis not present

## 2022-10-19 LAB — CBC
HCT: 42.3 % (ref 39.0–52.0)
Hemoglobin: 13 g/dL (ref 13.0–17.0)
MCH: 19.2 pg — ABNORMAL LOW (ref 26.0–34.0)
MCHC: 30.7 g/dL (ref 30.0–36.0)
MCV: 62.5 fL — ABNORMAL LOW (ref 80.0–100.0)
Platelets: 262 10*3/uL (ref 150–400)
RBC: 6.77 MIL/uL — ABNORMAL HIGH (ref 4.22–5.81)
RDW: 18.3 % — ABNORMAL HIGH (ref 11.5–15.5)
WBC: 12.3 10*3/uL — ABNORMAL HIGH (ref 4.0–10.5)
nRBC: 0.2 % (ref 0.0–0.2)

## 2022-10-19 LAB — RENAL FUNCTION PANEL
Albumin: 3.2 g/dL — ABNORMAL LOW (ref 3.5–5.0)
Anion gap: 14 (ref 5–15)
BUN: 29 mg/dL — ABNORMAL HIGH (ref 8–23)
CO2: 27 mmol/L (ref 22–32)
Calcium: 9.2 mg/dL (ref 8.9–10.3)
Chloride: 93 mmol/L — ABNORMAL LOW (ref 98–111)
Creatinine, Ser: 1.28 mg/dL — ABNORMAL HIGH (ref 0.61–1.24)
GFR, Estimated: 56 mL/min — ABNORMAL LOW (ref >60)
Glucose, Bld: 121 mg/dL — ABNORMAL HIGH (ref 70–99)
Phosphorus: 5.7 mg/dL — ABNORMAL HIGH (ref 2.5–4.6)
Potassium: 4.3 mmol/L (ref 3.5–5.1)
Sodium: 134 mmol/L — ABNORMAL LOW (ref 135–145)

## 2022-10-19 LAB — MAGNESIUM: Magnesium: 2.4 mg/dL (ref 1.7–2.4)

## 2022-10-19 LAB — PROTIME-INR
INR: 2.2 — ABNORMAL HIGH (ref 0.8–1.2)
Prothrombin Time: 24 seconds — ABNORMAL HIGH (ref 11.4–15.2)

## 2022-10-19 LAB — LEGIONELLA PNEUMOPHILA SEROGP 1 UR AG: L. pneumophila Serogp 1 Ur Ag: NEGATIVE

## 2022-10-19 LAB — BRAIN NATRIURETIC PEPTIDE: B Natriuretic Peptide: 34.4 pg/mL (ref 0.0–100.0)

## 2022-10-19 MED ORDER — SODIUM CHLORIDE 0.9 % IV SOLN
500.0000 mg | INTRAVENOUS | Status: DC
  Administered 2022-10-19: 500 mg via INTRAVENOUS
  Filled 2022-10-19: qty 5

## 2022-10-19 MED ORDER — SENNOSIDES-DOCUSATE SODIUM 8.6-50 MG PO TABS: 2.0000 | ORAL_TABLET | Freq: Two times a day (BID) | ORAL | Status: DC | PRN

## 2022-10-19 MED ORDER — POLYETHYLENE GLYCOL 3350 17 GM/SCOOP PO POWD
17.0000 g | Freq: Two times a day (BID) | ORAL | 2 refills | Status: DC | PRN
  Filled 2022-10-19: qty 238, 7d supply, fill #0

## 2022-10-19 MED ORDER — COLCHICINE 0.6 MG PO TABS: 0.6000 mg | ORAL_TABLET | Freq: Every day | ORAL | Status: DC | PRN

## 2022-10-19 MED ORDER — AMOXICILLIN-POT CLAVULANATE 875-125 MG PO TABS
1.0000 | ORAL_TABLET | Freq: Two times a day (BID) | ORAL | 0 refills | Status: AC
  Filled 2022-10-19: qty 4, 2d supply, fill #0

## 2022-10-19 MED ORDER — POLYETHYLENE GLYCOL 3350 17 G PO PACK
34.0000 g | PACK | Freq: Once | ORAL | Status: AC
  Administered 2022-10-19: 34 g via ORAL
  Filled 2022-10-19: qty 2

## 2022-10-19 MED ORDER — GUAIFENESIN ER 600 MG PO TB12: 600.0000 mg | ORAL_TABLET | Freq: Two times a day (BID) | ORAL | 0 refills | Status: AC

## 2022-10-19 MED ORDER — SODIUM CHLORIDE 0.9 % IV SOLN
2.0000 g | INTRAVENOUS | Status: DC
  Administered 2022-10-19: 2 g via INTRAVENOUS
  Filled 2022-10-19: qty 20

## 2022-10-19 MED ORDER — TORSEMIDE 20 MG PO TABS
40.0000 mg | ORAL_TABLET | Freq: Every day | ORAL | 11 refills | Status: DC
  Filled 2022-10-19: qty 60, 30d supply, fill #0

## 2022-10-19 NOTE — Progress Notes (Signed)
ANTICOAGULATION CONSULT NOTE -   Pharmacy Consult for Warfarin  Indication: Atrial fibrillation, Mechanical AVR  Allergies  Allergen Reactions   Celebrex [Celecoxib] Rash    Patient Measurements: Height: 5\' 11"  (180.3 cm) Weight: 92.3 kg (203 lb 7.8 oz) IBW/kg (Calculated) : 75.3  Vital Signs: Temp: 98 F (36.7 C) (03/28 1228) Temp Source: Oral (03/28 1228) BP: 90/62 (03/28 1228) Pulse Rate: 98 (03/28 1228)  Labs: Recent Labs    10/17/22 0251 10/18/22 0240 10/19/22 0139  HGB 12.6* 12.7* 13.0  HCT 40.6 40.3 42.3  PLT 250 233 262  LABPROT 35.6* 31.7* 24.0*  INR 3.6* 3.1* 2.2*  CREATININE 1.15 1.22 1.28*     Estimated Creatinine Clearance: 52.6 mL/min (A) (by C-G formula based on SCr of 1.28 mg/dL (H)).   Medical History: Past Medical History:  Diagnosis Date   Anticoagulant long-term use    Arthritis    "probably in my thumbs" (06/26/2012)   Bifascicular block    Chronic combined systolic and diastolic CHF (congestive heart failure) ([place])    a. 01/2016 Echo: EF 45-50%, gr2 DD, inflat AK (poor acoustic windows);  b. 07/2016 Echo: EF 25-30%, mild LVH, PASP 81mmHg (pt in Afib).   Complication of anesthesia    "wake up w/a start; hallucinations" (06/26/2012)   Critical Aortic Stenosis    a. 03/2003 s/p SJM #25 mech prosthetic AoV;  b. 07/2016 Echo: EF 25-30% (in setting of AF), AoV area 1.72 cm^2 (VTI), 1.75 cm^2 (Vmean).   Diverticulosis    Gouty arthritis    "have had it in both feet, ankles, right knee" (06/26/2012)   History of diverticulitis of colon    History of kidney stones    History of pancreatitis    Hypertension    Incomplete RBBB    PAF (paroxysmal atrial fibrillation) ([place])    a. CHA2DS2VASc = 4-->chronic coumadin in setting of mech AoV;  b. 07/2016 Recurrent AF RVR.     Assessment: 82 y/o M presents to the ED with shortness of breath and cough, on warfarin PTA for afib and mechanical AVR.   -INR= 2.2  He does not know his home warfarin regimen.  His PCP manages his warfarin and last dose was reported as  -4mg  five days if the week and, 2.5mg  for two days of the week  (it is unclear which days he takes 2.5mg ).  Goal of Therapy:  INR 2-3 Monitor platelets by anticoagulation protocol: Yes   Plan:  -Warfarin 4mg  x1 today -Daily INR  Hildred Laser, PharmD Clinical Pharmacist **Pharmacist phone directory can now be found on [email or web address] ([email or web address]).  Listed under [place].

## 2022-10-19 NOTE — Progress Notes (Signed)
Occupational Therapy Treatment and Discharge Summary Patient Details Name: Adrian Lambert MRN: [id number] DOB: Nov 01, 1940 Today's Date: 10/19/2022   History of present illness 82 y.o. male who presented with with complaints of shortness of breath and dry cough for a few days. PMH of G2DD, paroxysmal Afib on coumadin, aortic stenosis s/p mechanical valve replacement, nephrolithiasis, pancreatitis.   OT comments  Pt continuing to progress towards patient focused goals. Pt currently completing functional ambulation and activity with Supervision no AD with HR presenting at 119 bpm with activity but returning to 105 bpm with rest, patient reports increased HR is baseline for him. Patient did report mild fatigue at end of OT session but no SOB noted. Patient has no further acute skilled OT needs. Pt remains appropriate for Outpatient OT services to maximize functional independence with RUE.   Recommendations for follow up therapy are one component of a multi-disciplinary discharge planning process, led by the attending physician.  Recommendations may be updated based on patient status, additional functional criteria and insurance authorization.    Assistance Recommended at Discharge PRN  Patient can return home with the following  A little help with bathing/dressing/bathroom;A little help with walking and/or transfers;Assistance with cooking/housework   Equipment Recommendations  None recommended by OT    Recommendations for Other Services      Precautions / Restrictions Precautions Precautions: Fall Precaution Comments: monitor 02 and HR Restrictions Weight Bearing Restrictions: No       Mobility Bed Mobility Overal bed mobility: Modified Independent             General bed mobility comments: HOB elevated for supine<>sit    Transfers Overall transfer level: Needs assistance Equipment used: None Transfers: Sit to/from Stand Sit to Stand: Supervision                  Balance Overall balance assessment: No apparent balance deficits (not formally assessed) Sitting-balance support: Feet supported Sitting balance-Leahy Scale: Normal     Standing balance support: During functional activity, No upper extremity supported Standing balance-Leahy Scale: Good                             ADL either performed or assessed with clinical judgement   ADL Overall ADL's : Needs assistance/impaired     Grooming: Standing;Wash/dry face;Supervision/safety Grooming Details (indicate cue type and reason): No UE supported                             Functional mobility during ADLs: Supervision/safety      Extremity/Trunk Assessment Upper Extremity Assessment RUE Deficits / Details: R shoulder limited in flexion, unable to go beyond 100 degrees AROM d/t prior injury   Lower Extremity Assessment Lower Extremity Assessment: Overall WFL for tasks assessed   Cervical / Trunk Assessment Cervical / Trunk Assessment: Normal    Vision       Perception     Praxis      Cognition Arousal/Alertness: Awake/alert Behavior During Therapy: WFL for tasks assessed/performed Overall Cognitive Status: Within Functional Limits for tasks assessed                                          Exercises      Shoulder Instructions       General Comments HR at 119  bpm with functional ambulation, continued to be at increased level during all functional activites. Pt reported some fatigue at end of session after walking from one end of hallway and back. HR returned to 105 bpm while sitting in recliner. Sp02 fluctuating between 91%-97% on RA during functional ambulation    Pertinent Vitals/ Pain       Pain Assessment Pain Assessment: No/denies pain  Home Living                                          Prior Functioning/Environment              Frequency  Other (comment) (DC)        Progress Toward  Goals  OT Goals(current goals can now be found in the care plan section)  Progress towards OT goals: Progressing toward goals  Acute Rehab OT Goals Patient Stated Goal: Go home OT Goal Formulation: With patient Time For Goal Achievement: 10/31/22 Potential to Achieve Goals: Good  Plan Discharge plan remains appropriate;Frequency needs to be updated    Co-evaluation                 AM-PAC OT "6 Clicks" Daily Activity     Outcome Measure   Help from another person eating meals?: None Help from another person taking care of personal grooming?: A Little Help from another person toileting, which includes using toliet, bedpan, or urinal?: A Little Help from another person bathing (including washing, rinsing, drying)?: A Little Help from another person to put on and taking off regular upper body clothing?: A Little Help from another person to put on and taking off regular lower body clothing?: A Little 6 Click Score: 19    End of Session    OT Visit Diagnosis: Unsteadiness on feet (R26.81);Other abnormalities of gait and mobility (R26.89)   Activity Tolerance Patient tolerated treatment well   Patient Left with call bell/phone within reach;in chair   Nurse Communication Mobility status        Time: [id number] OT Time Calculation (min): 24 min  Charges: OT General Charges $OT Visit: 1 Visit OT Treatments $Therapeutic Activity: 23-37 mins  10/19/2022  AB, OTR/L  Acute Rehabilitation Services  Office: [phone]   Cori Razor 10/19/2022, 10:02 AM

## 2022-10-19 NOTE — TOC Transition Note (Signed)
Transition of Care [facility]) - CM/SW Discharge Note   Patient Details  Name: RAYNALDO WO MRN: [id number] Date of Birth: 01-28-1941  Transition of Care [facility]) CM/SW Contact:  Verdell Carmine, RN Phone Number: 10/19/2022, 10:11 AM   Clinical Narrative:    Readmission risk 21% Patient weaned off oxygen, no needs identified for DC. Will be on PO antibiotics 2 more days. Ambulating without difficulty on room air.  Final next level of care: Home/Self Care Barriers to Discharge: No Barriers Identified   Patient Goals and CMS Choice      Discharge Placement  Home self care                       Discharge Plan and Services Additional resources added to the After Visit Summary for                                       Social Determinants of Health (SDOH) Interventions SDOH Screenings   Food Insecurity: Patient Declined (10/16/2022)  Housing: Low Risk  (10/16/2022)  Transportation Needs: No Transportation Needs (10/16/2022)  Utilities: Not At Risk (10/16/2022)  Tobacco Use: Low Risk  (10/16/2022)     Readmission Risk Interventions     No data to display

## 2022-10-19 NOTE — Discharge Summary (Signed)
Physician Discharge Summary  Adrian Lambert [id number] DOB: 31-Aug-1940 DOA: 10/16/2022  PCP: Lawerance Cruel, MD  Admit date: 10/16/2022 Discharge date: 10/19/2022 Admitted From: Home Disposition: Home Recommendations for Outpatient Follow-up:  Follow up with PCP in 1 week Outpatient follow-up with cardiology in 2 to 3 weeks Check BP, fluid status, INR, CMP and CBC at follow-up Please follow up on the following pending results: None  Home Health: Not indicated Equipment/Devices: Not indicated  Discharge Condition: Stable CODE STATUS: Full code  Follow-up Information     Lawerance Cruel, MD. Schedule an appointment as soon as possible for a visit in 1 week(s).   Specialty: Family Medicine Contact information: [place] [address] [facility] course 81 year old M with PMH of combined CHF, mechanical AVR and PAF on warfarin and BPH presenting with SOB and dry cough and admitted for acute on chronic combined CHF and multifocal pneumonia, and started on antibiotics and IV diuretics.  CXR showed bilateral infiltrates.  CT abdomen and pelvis with a small to moderate bilateral effusion.  BNP elevated to 1800.  Had a JVD on presentation.   Patient was diuresed with IV Lasix.  Net -6.4 L.  Weight down from 200 9203 pounds.  BNP down to 34.  He completed 4 days of CTX and Zithromax while in-house and discharged on p.o. Augmentin for 2 more days.  Increased home torsemide to 40 mg daily on discharge.  Reassess fluid status, renal functions and electrolytes at follow-up.  Patient was evaluated by therapy and no need identified.  See individual problem list below for more.   Problems addressed during this hospitalization Principal Problem:   Acute on chronic combined systolic and diastolic CHF, NYHA class 3 (HCC) Active Problems:   Essential hypertension   RHINITIS   S/P AVR (aortic valve replacement)   Prostate cancer  (HCC)   Atrial fibrillation (HCC)   Multifocal pneumonia   Sepsis (HCC)   BPH (benign prostatic hyperplasia)   Acute on chronic combined CHF: TTE in 07/2021 with LVEF of 35 to 40%, GH, indeterminate DD, mod MR and RVSP of 51 mmHg.  Reportedly had JVD on presentation.  BNP elevated to 1800.  Diuresed with IV Lasix.  BNP down to 34.  Net -6.4 L.  Weight down by 6 pounds. -Increased home dose mild from 20 to 40 mg daily -Continue home Toprol-XL, Aldactone, Jardiance and Entresto -Reassess BP, fluid status, renal functions and electrolytes in 1 week.   Sepsis due to multifocal pneumonia: CXR with bilateral infiltrates.  Has respiratory symptoms and lactic acidosis.  -Received CTX and Zithromax for 4 days.  Discharged on p.o. Augmentin for 2 more days. -Mucolytic as needed.   Essential hypertension: Normotensive. -Cardiac meds as above   Mechanical aortic valve: INR 2.1. -INR check in 1 week -Continue home warfarin.   Paroxysmal A-fib: HR in 90s and 100s. -Continue Toprol-XL as above -Continue warfarin   Left lower back bruising: Denies any trauma.  Likely from anticoagulation.  CT abdomen and pelvis unrevealing.   Generalized weakness: -PT/OT-no need identified.   BPH: -Continue Flomax.   Acute hypoxic respiratory failure ruled out.  No documented desaturation.           Time spent 40 minutes  Vital signs Vitals:   10/19/22 0800 10/19/22 0816 10/19/22 1008 10/19/22 1228  BP: 106/79 106/79 103/69 90/62  Pulse: (!) 107 Marland Kitchen)  103 98 98  Temp: 97.6 F (36.4 C) 97.6 F (36.4 C)  98 F (36.7 C)  Resp: 20 20  20   Height:      Weight:      SpO2: 90% 90%  98%  TempSrc: Oral Oral  Oral  BMI (Calculated):         Discharge exam  GENERAL: No apparent distress.  Nontoxic. HEENT: MMM.  Vision and hearing grossly intact.  NECK: Supple.  No apparent JVD.  RESP:  No IWOB.  Fair aeration bilaterally.  Crackles over lung bases. CVS: Irregular rhythm.  Normal rate.  Mechanical  heart sound.  ABD/GI/GU: BS+. Abd soft, NTND.  MSK/EXT:  Moves extremities. No apparent deformity. No edema.  SKIN: no apparent skin lesion or wound NEURO: Awake and alert. Oriented appropriately.  No apparent focal neuro deficit. PSYCH: Calm. Normal affect.   Discharge Instructions Discharge Instructions     (HEART FAILURE PATIENTS) Call MD:  Anytime you have any of the following symptoms: 1) 3 pound weight gain in 24 hours or 5 pounds in 1 week 2) shortness of breath, with or without a dry hacking cough 3) swelling in the hands, feet or stomach 4) if you have to sleep on extra pillows at night in order to breathe.   Complete by: As directed    Call MD for:  difficulty breathing, headache or visual disturbances   Complete by: As directed    Call MD for:  extreme fatigue   Complete by: As directed    Call MD for:  persistant dizziness or light-headedness   Complete by: As directed    Call MD for:  severe uncontrolled pain   Complete by: As directed    Diet - low sodium heart healthy   Complete by: As directed    Discharge instructions   Complete by: As directed    It has been a pleasure taking care of you!  You were hospitalized due to heart failure exacerbation and possible pneumonia for which you have been treated with fluid medication and antibiotics.  We are discharging you more antibiotics to complete treatment course.  In regards to heart failure, we have increased your torsemide to 40 mg daily.  Please review your new medication list and the directions on your medications before you take them.  Follow-up with your primary care doctor and cardiologist in 1 to 2 weeks or sooner if needed.  In addition to taking your medications as prescribed, we also recommend you avoid alcohol or over-the-counter pain medication other than plain Tylenol, limit the amount of water/fluid you drink to less than 6 cups (1500 cc) a day,  limit your sodium (salt) intake to less than 2 g (2000 mg) a day  and weigh yourself daily at the same time and keeping your weight log.     Take care,   Increase activity slowly   Complete by: As directed       Allergies as of 10/19/2022       Reactions   Celebrex [celecoxib] Rash        Medication List     STOP taking these medications    promethazine-dextromethorphan 6.25-15 MG/5ML syrup Commonly known as: PROMETHAZINE-DM       TAKE these medications    acetaminophen 500 MG tablet Commonly known as: TYLENOL Take 500 mg by mouth 2 (two) times daily.   allopurinol 300 MG tablet Commonly known as: ZYLOPRIM Take 300 mg by mouth daily.   amoxicillin 500 MG  capsule Commonly known as: AMOXIL Take 2,000 mg by mouth See admin instructions. Take 2000 mg 1 hour prior to dental work   amoxicillin-clavulanate 875-125 MG tablet Commonly known as: AUGMENTIN Take 1 tablet by mouth 2 (two) times daily for 2 days. Start taking on: October 20, 2022   aspirin EC 81 MG tablet Take 81 mg by mouth daily. Swallow whole. What changed: Another medication with the same name was removed. Continue taking this medication, and follow the directions you see here.   benzonatate 200 MG capsule Commonly known as: TESSALON Take 200 mg by mouth 3 (three) times daily as needed for cough.   colchicine 0.6 MG tablet Take 1 tablet (0.6 mg total) by mouth daily as needed (for gout).   docusate sodium 100 MG capsule Commonly known as: COLACE Take 100 mg by mouth 2 (two) times daily.   Entresto 24-26 MG Generic drug: sacubitril-valsartan TAKE 1 TABLET BY MOUTH TWICE A DAY   Farxiga 10 MG Tabs tablet Generic drug: dapagliflozin propanediol TAKE 1 TABLET BY MOUTH EVERY DAY BEFORE BREAKFAST What changed: See the new instructions.   FIBER PO Take 2 capsules by mouth 2 (two) times daily.   folic acid 1 MG tablet Commonly known as: FOLVITE TAKE 1 TABLET(1 MG) BY MOUTH DAILY What changed: See the new instructions.   guaiFENesin 600 MG 12 hr  tablet Commonly known as: Mucinex Take 1 tablet (600 mg total) by mouth 2 (two) times daily for 5 days.   metoprolol succinate 25 MG 24 hr tablet Commonly known as: TOPROL-XL TAKE 1 TAB BY MOUTH AT BEDTIME What changed: See the new instructions.   polyethylene glycol powder 17 GM/SCOOP powder Commonly known as: GLYCOLAX/MIRALAX Take 17 g by mouth 2 (two) times daily as needed for moderate constipation or mild constipation.   spironolactone 25 MG tablet Commonly known as: ALDACTONE Take 1 tablet (25 mg total) by mouth daily.   tamsulosin 0.4 MG Caps capsule Commonly known as: FLOMAX Take 0.4 mg by mouth every other day.   tolterodine 4 MG 24 hr capsule Commonly known as: DETROL LA Take 4 mg by mouth every other day. Alternating days with the tamsulosin   torsemide 20 MG tablet Commonly known as: DEMADEX Take 2 tablets (40 mg total) by mouth daily. Start taking on: October 20, 2022 What changed: See the new instructions.   warfarin 5 MG tablet Commonly known as: COUMADIN Take 0.5-1 tablets (2.5-5 mg total) by mouth See admin instructions. 1/19 and 1/20  take 7.5mg  (1 and 1/2 tab) then take 1 tab (5mg ) daily until seen by MD  Take 1 tablet (5 mg) by mouth on Sundays, Mondays, Wednesdays, Thursdays & Saturdays in the evening. Take 0.5 tablet (2.5 mg) by mouth on Tuesdays & Fridays in the evening. What changed: additional instructions        Consultations: None  Procedures/Studies:   CT CHEST ABDOMEN PELVIS W CONTRAST  Result Date: 10/16/2022 CLINICAL DATA:  Sepsis with left flank bruising cough EXAM: CT CHEST, ABDOMEN, AND PELVIS WITH CONTRAST TECHNIQUE: Multidetector CT imaging of the chest, abdomen and pelvis was performed following the standard protocol during bolus administration of intravenous contrast. RADIATION DOSE REDUCTION: This exam was performed according to the departmental dose-optimization program which includes automated exposure control, adjustment of the  mA and/or kV according to patient size and/or use of iterative reconstruction technique. CONTRAST:  169mL OMNIPAQUE IOHEXOL 300 MG/ML  SOLN COMPARISON:  Chest x-ray 10/16/2022, MRI 12/15/2021 FINDINGS: CT CHEST FINDINGS Cardiovascular:  Mild aortic atherosclerosis. 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 Maximum arch diameter of 3.2 cm previously 3.1 cm. No dissection. Status post aortic valve replacement. Borderline cardiomegaly. No pericardial effusion. Numerous enhancing collateral vessels at the right shoulder and chest wall, with possible stenosis of the right subclavian vein prior to its confluence with the jugular vein. Mediastinum/Nodes: Midline trachea. No thyroid mass. Borderline right paratracheal nodes measuring up to 11 mm. Esophagus within normal limits Lungs/Pleura: Small moderate bilateral effusions. Heterogeneous right greater than left ground-glass densities and irregular consolidations. No visible pneumothorax. Musculoskeletal: Post sternotomy changes. No acute osseous abnormality. CT ABDOMEN PELVIS FINDINGS Hepatobiliary: No focal liver abnormality is seen. Status post cholecystectomy. No biliary dilatation. Pancreas: Unremarkable. No pancreatic ductal dilatation or surrounding inflammatory changes. Spleen: Normal in size without focal abnormality. Adrenals/Urinary Tract: Adrenal glands are within normal limits. Bilateral renal cysts measuring up to 7 cm lower pole right kidney, no imaging follow-up is recommended. No hydronephrosis. The bladder is unremarkable Stomach/Bowel: Stomach is within normal limits. Appendix appears normal. No evidence of bowel wall thickening, distention, or inflammatory changes. Diverticular disease of the colon without acute inflammatory process. Vascular/Lymphatic: Mild aortic atherosclerosis. No aneurysm. No suspicious lymph nodes. Reproductive: Multiple prostate seeds. Other: Negative for pelvic effusion or free air. Small fat  containing inguinal hernias. Small umbilical hernia containing fat and anterior wall of small bowel. There is edema within the subcutaneous fat surrounding the hernia but no inflammation within the hernia sac. Musculoskeletal: Multilevel degenerative changes. No acute osseous abnormality. IMPRESSION: 1. Small to moderate bilateral effusions. Heterogeneous right greater than left ground-glass densities and irregular consolidations suspicious for multifocal pneumonia to include atypical or viral organisms. 2. No CT evidence for acute intra-abdominal or pelvic abnormality. Diverticular disease of the colon without acute inflammatory process. 3. Small umbilical hernia containing fat and anterior wall of small bowel. There is edema within the subcutaneous fat surrounding the hernia but no inflammation within the hernia sac. Correlate with direct inspection. 4. Numerous enhancing collateral vessels at the right shoulder and chest wall, with possible stenosis of the right central subclavian vein. 5. Aortic atherosclerosis. Aortic Atherosclerosis (ICD10-I70.0). Electronically Signed   By: Donavan Foil M.D.   On: 10/16/2022 16:33   DG Chest 2 View  Result Date: 10/16/2022 CLINICAL DATA:  Chest pain and cough. EXAM: CHEST - 2 VIEW COMPARISON:  May 20, 2021 FINDINGS: Stable postsurgical changes. Calcific atherosclerotic disease and tortuosity of the aorta. The cardiac silhouette is mildly enlarged. Bilateral patchy areas of airspace consolidation, right worse than left. Right pleural effusion. Osseous structures are without acute abnormality. Soft tissues are grossly normal. IMPRESSION: 1. Bilateral patchy areas of airspace consolidation, right worse than left. 2. Right pleural effusion. Electronically Signed   By: Fidela Salisbury M.D.   On: 10/16/2022 14:33       The results of significant diagnostics from this hospitalization (including imaging, microbiology, ancillary and laboratory) are listed below for  reference.     Microbiology: Recent Results (from the past 240 hour(s))  Resp panel by RT-PCR (RSV, Flu A&B, Covid) Anterior Nasal Swab     Status: None   Collection Time: 10/16/22  2:21 PM   Specimen: Anterior Nasal Swab  Result Value Ref Range Status   SARS Coronavirus 2 by RT PCR NEGATIVE NEGATIVE Final    Comment: (NOTE) SARS-CoV-2 target nucleic acids are NOT DETECTED.  The SARS-CoV-2 RNA is generally detectable in upper respiratory specimens during the acute phase of  infection. The lowest concentration of SARS-CoV-2 viral copies this assay can detect is 138 copies/mL. A negative result does not preclude SARS-Cov-2 infection and should not be used as the sole basis for treatment or other patient management decisions. A negative result may occur with  improper specimen collection/handling, submission of specimen other than nasopharyngeal swab, presence of viral mutation(s) within the areas targeted by this assay, and inadequate number of viral copies(<138 copies/mL). A negative result must be combined with clinical observations, patient history, and epidemiological information. The expected result is Negative.  Fact Sheet for Patients:  EntrepreneurPulse.com.au  Fact Sheet for Healthcare Providers:  IncredibleEmployment.be  This test is no t yet approved or cleared by the [place] FDA and  has been authorized for detection and/or diagnosis of SARS-CoV-2 by FDA under an Emergency Use Authorization (EUA). This EUA will remain  in effect (meaning this test can be used) for the duration of the COVID-19 declaration under Section 564(b)(1) of the Act, 21 U.S.C.section 360bbb-3(b)(1), unless the authorization is terminated  or revoked sooner.       Influenza A by PCR NEGATIVE NEGATIVE Final   Influenza B by PCR NEGATIVE NEGATIVE Final    Comment: (NOTE) The Xpert Xpress SARS-CoV-2/FLU/RSV plus assay is intended as an aid in the  diagnosis of influenza from Nasopharyngeal swab specimens and should not be used as a sole basis for treatment. Nasal washings and aspirates are unacceptable for Xpert Xpress SARS-CoV-2/FLU/RSV testing.  Fact Sheet for Patients: EntrepreneurPulse.com.au  Fact Sheet for Healthcare Providers: IncredibleEmployment.be  This test is not yet approved or cleared by the [place] FDA and has been authorized for detection and/or diagnosis of SARS-CoV-2 by FDA under an Emergency Use Authorization (EUA). This EUA will remain in effect (meaning this test can be used) for the duration of the COVID-19 declaration under Section 564(b)(1) of the Act, 21 U.S.C. section 360bbb-3(b)(1), unless the authorization is terminated or revoked.     Resp Syncytial Virus by PCR NEGATIVE NEGATIVE Final    Comment: (NOTE) Fact Sheet for Patients: EntrepreneurPulse.com.au  Fact Sheet for Healthcare Providers: IncredibleEmployment.be  This test is not yet approved or cleared by the [place] FDA and has been authorized for detection and/or diagnosis of SARS-CoV-2 by FDA under an Emergency Use Authorization (EUA). This EUA will remain in effect (meaning this test can be used) for the duration of the COVID-19 declaration under Section 564(b)(1) of the Act, 21 U.S.C. section 360bbb-3(b)(1), unless the authorization is terminated or revoked.  Performed at [facility], [address] [place], [place] [address]   Culture, blood (routine x 2)     Status: None (Preliminary result)   Collection Time: 10/16/22  2:41 PM   Specimen: BLOOD LEFT FOREARM  Result Value Ref Range Status   Specimen Description   Final    BLOOD LEFT FOREARM Performed at [facility], [place] [address] [place], [place] [address]    Special Requests   Final    BOTTLES DRAWN AEROBIC AND ANAEROBIC Blood Culture adequate volume Performed at [facility], [address] [place], [place] [address]    Culture   Final    NO GROWTH 3 DAYS Performed at [facility], [place] [address], [place], [place] [address]    Report Status PENDING  Incomplete  Culture, blood (routine x 2)     Status: None (Preliminary result)   Collection Time: 10/16/22  2:46 PM   Specimen: BLOOD  Result Value Ref Range Status   Specimen Description   Final    BLOOD RIGHT ANTECUBITAL Performed at [facility], [address] [place], [place] [address]    Special Requests   Final    BOTTLES DRAWN AEROBIC AND ANAEROBIC Blood Culture adequate volume Performed at [facility], [address] [place], [place] [address]    Culture   Final    NO GROWTH 3 DAYS Performed at [facility], [place] [address], [place], [place] [address]    Report Status PENDING  Incomplete  Respiratory (~20 pathogens) panel by PCR     Status: None   Collection Time: 10/17/22  7:38 AM   Specimen: Nasopharyngeal Swab; Respiratory  Result Value Ref Range Status   Adenovirus NOT DETECTED NOT DETECTED Final   Coronavirus 229E NOT DETECTED NOT DETECTED Final    Comment: (NOTE) The Coronavirus on the Respiratory Panel, DOES NOT test for the novel  Coronavirus (2019 nCoV)    Coronavirus HKU1 NOT DETECTED NOT DETECTED Final   Coronavirus NL63 NOT DETECTED NOT DETECTED Final   Coronavirus OC43 NOT DETECTED NOT DETECTED Final   Metapneumovirus NOT DETECTED NOT DETECTED Final   Rhinovirus / Enterovirus NOT DETECTED NOT DETECTED Final   Influenza A NOT DETECTED NOT DETECTED Final   Influenza B NOT DETECTED NOT DETECTED Final   Parainfluenza Virus 1 NOT DETECTED NOT DETECTED Final   Parainfluenza Virus 2 NOT DETECTED NOT DETECTED Final   Parainfluenza Virus 3 NOT DETECTED NOT DETECTED Final   Parainfluenza Virus 4 NOT DETECTED NOT DETECTED Final   Respiratory Syncytial Virus NOT DETECTED NOT DETECTED Final   Bordetella pertussis NOT DETECTED NOT  DETECTED Final   Bordetella Parapertussis NOT DETECTED NOT DETECTED Final   Chlamydophila pneumoniae NOT DETECTED NOT DETECTED Final   Mycoplasma pneumoniae NOT DETECTED NOT DETECTED Final    Comment: Performed at [facility], [place]. [address] [place], [place] [address]     Labs:  CBC: Recent Labs  Lab 10/16/22 1414 10/17/22 0251 10/18/22 0240 10/19/22 0139  WBC 13.4* 13.9* 12.0* 12.3*  HGB 13.7 12.6* 12.7* 13.0  HCT 45.0 40.6 40.3 42.3  MCV 63.2* 62.8* 61.6* 62.5*  PLT 295 250 233 262   BMP &GFR Recent Labs  Lab 10/16/22 1414 10/17/22 0251 10/18/22 0240 10/19/22 0139  NA 131* 135 134* 134*  K 4.5 4.1 4.1 4.3  CL 100 101 99 93*  CO2 21* 22 23 27   GLUCOSE 112* 108* 117* 121*  BUN 21 21 25* 29*  CREATININE 1.12 1.15 1.22 1.28*  CALCIUM 8.7* 8.6* 8.7* 9.2  MG  --  2.3 2.3 2.4  PHOS  --  3.9 6.0* 5.7*   Estimated Creatinine Clearance: 52.6 mL/min (A) (by C-G formula based on SCr of 1.28 mg/dL (H)). Liver & Pancreas: Recent Labs  Lab 10/16/22 1414 10/17/22 0251 10/18/22 0240 10/19/22 0139  AST 44* 27  --   --   ALT 38 32  --   --   ALKPHOS 83 76  --   --   BILITOT 1.9* 1.2  --   --   PROT 6.9 5.9*  --   --   ALBUMIN 3.7 3.1* 3.0* 3.2*   No results for input(s): "LIPASE", "AMYLASE" in the last 168 hours. No results for input(s): "AMMONIA" in the last 168 hours. Diabetic: No results for input(s): "HGBA1C" in the last 72 hours. No results for input(s): "GLUCAP" in the last  168 hours. Cardiac Enzymes: No results for input(s): "CKTOTAL", "CKMB", "CKMBINDEX", "TROPONINI" in the last 168 hours. No results for input(s): "PROBNP" in the last 8760 hours. Coagulation Profile: Recent Labs  Lab 10/16/22 1414 10/17/22 0251 10/18/22 0240 10/19/22 0139  INR 3.6* 3.6* 3.1* 2.2*   Thyroid Function Tests: No results for input(s): "TSH", "T4TOTAL", "FREET4", "T3FREE", "THYROIDAB" in the last 72 hours. Lipid Profile: No results for input(s): "CHOL", "HDL",  "LDLCALC", "TRIG", "CHOLHDL", "LDLDIRECT" in the last 72 hours. Anemia Panel: No results for input(s): "VITAMINB12", "FOLATE", "FERRITIN", "TIBC", "IRON", "RETICCTPCT" in the last 72 hours. Urine analysis:    Component Value Date/Time   COLORURINE YELLOW 10/18/2016 1330   APPEARANCEUR CLEAR 10/18/2016 1330   LABSPEC 1.010 10/18/2016 1330   PHURINE 5.0 10/18/2016 1330   GLUCOSEU NEGATIVE 10/18/2016 1330   HGBUR NEGATIVE 10/18/2016 1330   BILIRUBINUR negative 07/05/2022 0000   KETONESUR NEGATIVE 10/18/2016 1330   PROTEINUR Negative 07/05/2022 0000   PROTEINUR NEGATIVE 10/18/2016 1330   UROBILINOGEN 0.2 07/05/2022 0000   NITRITE negative 07/05/2022 0000   NITRITE NEGATIVE 10/18/2016 1330   LEUKOCYTESUR Negative 07/05/2022 0000   Sepsis Labs: Invalid input(s): "PROCALCITONIN", "LACTICIDVEN"   SIGNED:  Mercy Riding, MD  [facility] 10/19/2022, 1:20 PM

## 2022-10-21 LAB — CULTURE, BLOOD (ROUTINE X 2)
Culture: NO GROWTH
Culture: NO GROWTH
Special Requests: ADEQUATE
Special Requests: ADEQUATE

## 2022-10-23 DIAGNOSIS — R899 Unspecified abnormal finding in specimens from other organs, systems and tissues: Secondary | ICD-10-CM | POA: Diagnosis not present

## 2022-10-25 DIAGNOSIS — Z7901 Long term (current) use of anticoagulants: Secondary | ICD-10-CM | POA: Diagnosis not present

## 2022-10-25 DIAGNOSIS — D6869 Other thrombophilia: Secondary | ICD-10-CM | POA: Diagnosis not present

## 2022-10-25 DIAGNOSIS — I480 Paroxysmal atrial fibrillation: Secondary | ICD-10-CM | POA: Diagnosis not present

## 2022-10-25 DIAGNOSIS — J189 Pneumonia, unspecified organism: Secondary | ICD-10-CM | POA: Diagnosis not present

## 2022-10-25 DIAGNOSIS — I509 Heart failure, unspecified: Secondary | ICD-10-CM | POA: Diagnosis not present

## 2022-10-25 DIAGNOSIS — R899 Unspecified abnormal finding in specimens from other organs, systems and tissues: Secondary | ICD-10-CM | POA: Diagnosis not present

## 2022-10-30 DIAGNOSIS — E785 Hyperlipidemia, unspecified: Secondary | ICD-10-CM | POA: Diagnosis not present

## 2022-10-30 DIAGNOSIS — I480 Paroxysmal atrial fibrillation: Secondary | ICD-10-CM | POA: Diagnosis not present

## 2022-10-30 DIAGNOSIS — M6281 Muscle weakness (generalized): Secondary | ICD-10-CM | POA: Diagnosis not present

## 2022-10-30 DIAGNOSIS — Z556 Problems related to health literacy: Secondary | ICD-10-CM | POA: Diagnosis not present

## 2022-10-30 DIAGNOSIS — Z7984 Long term (current) use of oral hypoglycemic drugs: Secondary | ICD-10-CM | POA: Diagnosis not present

## 2022-10-30 DIAGNOSIS — I5043 Acute on chronic combined systolic (congestive) and diastolic (congestive) heart failure: Secondary | ICD-10-CM | POA: Diagnosis not present

## 2022-10-30 DIAGNOSIS — Z952 Presence of prosthetic heart valve: Secondary | ICD-10-CM | POA: Diagnosis not present

## 2022-10-30 DIAGNOSIS — I110 Hypertensive heart disease with heart failure: Secondary | ICD-10-CM | POA: Diagnosis not present

## 2022-10-30 DIAGNOSIS — Z8546 Personal history of malignant neoplasm of prostate: Secondary | ICD-10-CM | POA: Diagnosis not present

## 2022-10-30 DIAGNOSIS — Z7901 Long term (current) use of anticoagulants: Secondary | ICD-10-CM | POA: Diagnosis not present

## 2022-10-30 DIAGNOSIS — A419 Sepsis, unspecified organism: Secondary | ICD-10-CM | POA: Diagnosis not present

## 2022-10-30 DIAGNOSIS — I452 Bifascicular block: Secondary | ICD-10-CM | POA: Diagnosis not present

## 2022-10-30 DIAGNOSIS — N400 Enlarged prostate without lower urinary tract symptoms: Secondary | ICD-10-CM | POA: Diagnosis not present

## 2022-10-30 DIAGNOSIS — J310 Chronic rhinitis: Secondary | ICD-10-CM | POA: Diagnosis not present

## 2022-10-30 DIAGNOSIS — J189 Pneumonia, unspecified organism: Secondary | ICD-10-CM | POA: Diagnosis not present

## 2022-10-30 DIAGNOSIS — N3281 Overactive bladder: Secondary | ICD-10-CM | POA: Diagnosis not present

## 2022-10-30 DIAGNOSIS — Z7982 Long term (current) use of aspirin: Secondary | ICD-10-CM | POA: Diagnosis not present

## 2022-10-30 DIAGNOSIS — D6869 Other thrombophilia: Secondary | ICD-10-CM | POA: Diagnosis not present

## 2022-10-30 DIAGNOSIS — M109 Gout, unspecified: Secondary | ICD-10-CM | POA: Diagnosis not present

## 2022-11-06 ENCOUNTER — Encounter (HOSPITAL_COMMUNITY): Payer: Self-pay

## 2022-11-06 ENCOUNTER — Ambulatory Visit (HOSPITAL_COMMUNITY)
Admission: RE | Admit: 2022-11-06 | Discharge: 2022-11-06 | Disposition: A | Discharge status: Home / Self Care | Payer: Medicare Other | Source: Ambulatory Visit | Attending: Family Medicine | Admitting: Family Medicine

## 2022-11-06 ENCOUNTER — Telehealth (HOSPITAL_COMMUNITY): Payer: Self-pay

## 2022-11-06 VITALS — BP 104/66 | HR 81 | Wt 196.0 lb

## 2022-11-06 DIAGNOSIS — I5022 Chronic systolic (congestive) heart failure: Secondary | ICD-10-CM

## 2022-11-06 DIAGNOSIS — Z952 Presence of prosthetic heart valve: Secondary | ICD-10-CM | POA: Diagnosis not present

## 2022-11-06 DIAGNOSIS — I10 Essential (primary) hypertension: Secondary | ICD-10-CM | POA: Insufficient documentation

## 2022-11-06 DIAGNOSIS — I714 Abdominal aortic aneurysm, without rupture, unspecified: Secondary | ICD-10-CM

## 2022-11-06 DIAGNOSIS — I110 Hypertensive heart disease with heart failure: Secondary | ICD-10-CM | POA: Diagnosis not present

## 2022-11-06 DIAGNOSIS — I7121 Aneurysm of the ascending aorta, without rupture: Secondary | ICD-10-CM | POA: Insufficient documentation

## 2022-11-06 DIAGNOSIS — I480 Paroxysmal atrial fibrillation: Secondary | ICD-10-CM

## 2022-11-06 DIAGNOSIS — I4891 Unspecified atrial fibrillation: Secondary | ICD-10-CM | POA: Insufficient documentation

## 2022-11-06 DIAGNOSIS — Z7901 Long term (current) use of anticoagulants: Secondary | ICD-10-CM | POA: Insufficient documentation

## 2022-11-06 LAB — BASIC METABOLIC PANEL
Anion gap: 9 (ref 5–15)
BUN: 31 mg/dL — ABNORMAL HIGH (ref 8–23)
CO2: 27 mmol/L (ref 22–32)
Calcium: 9.2 mg/dL (ref 8.9–10.3)
Chloride: 98 mmol/L (ref 98–111)
Creatinine, Ser: 1.61 mg/dL — ABNORMAL HIGH (ref 0.61–1.24)
GFR, Estimated: 43 mL/min — ABNORMAL LOW (ref >60)
Glucose, Bld: 102 mg/dL — ABNORMAL HIGH (ref 70–99)
Potassium: 5 mmol/L (ref 3.5–5.1)
Sodium: 134 mmol/L — ABNORMAL LOW (ref 135–145)

## 2022-11-06 LAB — BRAIN NATRIURETIC PEPTIDE: B Natriuretic Peptide: 840 pg/mL — ABNORMAL HIGH (ref 0.0–100.0)

## 2022-11-06 NOTE — Telephone Encounter (Addendum)
Pt aware, agreeable, and verbalized understanding Lab appointment scheduled   ----- Message from Jacklynn Ganong, FNP sent at 11/06/2022  2:10 PM EDT ----- BNP and kidney function up.  Repeat BMET in 1 week to follow

## 2022-11-06 NOTE — Progress Notes (Signed)
PCP: Adrian Floro, MD Cardiology: Dr. [place]  HF Cardiology: Dr. Shirlee Lambert   HPI: [name] is a 82 y.o. male with a history of severe AS s/p St Jude mechanical AVR in 03/2003 on chronic coumadin, HTN, obesity, and mild LV dysfxn  EF 40-45% on 01/2016 echo.  Prior to admit in 1/18, he had a cough and dyspnea that was thought to be bronchitis so he was treated with abx and oral steroids. He was seen in cardiology office in 1/18 and was noted to be in rapid atrial fibrillation with significant volume overload.   Admitted 1/23 through 08/29/16 with marked volume overload, lower extremity edema, and atrial fibrillation with RVR.  Initial cardioversion failed and he was difficult to diurese. He was placed on milrinone for low output and diuresed with IV lasix with good response.  Weight came down considerably. Amiodarone was begun. Once fully diuresed, he had successful DC-CV on 08/28/16. Discharged on torsemide 60 mg daily. Discharge weight 212 pounds. Echo and TEE in 1/18 had shown fall in EF to around 25%.   He continued to lose weight as an outpatient.  He had a syncopal episode where he stood up from sitting and got lightheaded with transient loss of consciousness.  He had been having orthostatic symptoms.  We saw him in the office and cut back on torsemide from 60 mg daily down eventually to 20 mg daily.  Creatinine up some to 1.7 at that time.    At a prior appt, he was back in atrial fibrillation.  He was set up for DCCV in 3/18, but was in NSR when he arrived to short stay. He had atrial fibrillation ablation with Dr. Elberta Lambert in 5/18.   Echo was done in 5/18, EF up to 45% with mild diffuse hypokinesis, normal mechanical aortic valve, mildly decreased RV systolic function.    Echo was repeated in 5/19, EF 35-40%, diffuse hypokinesis, mildly decreased RV systolic function, stable mechanical aortic valve.   Echo in 11/21 showed EF up to 50-55%, normal RV size and systolic function, mechanical aortic  valve looks ok, normal IVC, ascending aorta 4.2 cm. Echo in 12/21 showed EF 60-65%, mild RV enlargement, normal RV function, mechanical aortic valve mean gradient 10 mmHg, ascending aorta 4.1 cm.  MRA chest in 1/22 showed 4.3 cm ascending aorta.   Echo 1/23 showed EF 35-40%, global hypokinesis, mildly decreased RV systolic function, normal mechanical aortic valve, moderate MR, PASP 51 mmHg. With EF back down and mild volume overload, he was started on Farxiga, torsemide increased and R/LHC arranged.  R/LHC (1/23) showed mild elevated PCWP with mild pulmonary venous hypertension, preserved CO and minimal CAD.  Follow up 9/23, found to be in AFL with increased fatige and mild volume overload. Cleda Daub increased to 25 mg daily and started on torsemide 20 mg daily. Arranged for DCCV, s/p DCCV 04/05/22 to NSR.  Admitted 3/24 with a/c dHF and multifocal pneumonia. Diuresed with IV lasix and received IV abx. Transitioned to higher dose of torsemide, 40 mg. He was discharged home, weight 203 lbs.  Today he returns for post hospital HF follow up. Overall feeling weak. He has SOB walking long distances on flat ground. Has non-productive cough, completed all abx from recent hospitalization. Denies palpitations, abnormal bleeding, CP dizziness, edema, or PND/Orthopnea. Appetite ok. No fever or chills. Weight at home 193 pounds. Taking all medications.   ECG (personally reviewed): NSR, iRBBB 80 bpm  Labs (3/19): K 4.7,creatinine 0.99 Labs (5/19): K 4.6, creatinine  0.98 Labs (9/19): K 4.2, creatinine 0.91 Labs (12/19): K 4.6, creatinine 1.14 Labs (3/20): K 4.6, creatinine 0.98 Labs (7/20): creatinine 1.07 Labs (9/21): K 5, creatinine 1.13 Labs (12/21): LDL 77, K 4.1, creatinine 0.45 Labs (6/22): K 4.8, creatinine 1.02 Labs (10/22): K 4.9, creatinine 1.02 Labs (11/22): K 4.9, creatinine 1.02 Labs (1/23): K 4.5, creatinine 1.18 Labs (2/23): K 4.8, creatinine 1.29 Labs (5/23): LDL 92 Labs (6/23): K 4.7,  creatinine 1.16, BNP 521 Labs (9/23): K 4.7, creatinine 1.24 Labs (3/24): K 4.3, creatinine 1.28  PMH: 1. Aortic stenosis: # 25 St Jude mechanical aortic valve in 9/14.   2. Chronic systolic CHF: Echo in 7/17 with EF 45-50%.  Admitted in 1/18 with afib with RVR, ?tachycardia-mediated cardiomyopathy.  - Echo (1/18) with EF 25-30%, stable mechanical aortic valve.  - TEE (1/18) with EF 20-25%, stable mechanical aortic valve, moderate MR, small PFO.  - Admission 1/18 with acute on chronic systolic CHF, required milrinone, extensively diuresed.  - Echo (5/18) with EF 45%, diffuse hypokinesis, normal RV size with mildly decreased systolic function, mechanical aortic valve with mean gradient 14 mmHg, ascending aorta 4.4 cm.  - Echo (5/19) with EF 35-40%, diffuse hypokinesis, mild LVH, mildly decreased RV systolic function, mechanical aortic valve looked ok.   - Echo (11/20) with EF up to 50-55%, normal RV size and systolic function, mechanical aortic valve looks ok, normal IVC, ascending aorta 4.2 cm.  - Echo (12/21) with EF 60-65%, mild RV enlargement, normal RV function, mechanical aortic valve mean gradient 10 mmHg, ascending aorta 4.1 cm. - Echo (1/23) with EF 35-40%, global hypokinesis, mildly decreased RV systolic function, normal mechanical aortic valve, moderate MR, PASP 51 mmHg.  - L/RHC (1/23): minimal CAD, RA mean 6, PA 35/18 (25) PCWP mean 17, preserved CO/CI 4.87/2.39, PVR 1.6 WU. 3. Atrial fibrillation: First noted in 1/18, uncertain how long it had been present (with RVR).  08/28/2016 Successful DC-CV --> NSR.  Back in atrial fibrillation at 09/18/16 office visit.  Set up for DCCV in 3/18 but back in NSR.   - Atrial fibrillation ablation in 5/18. Now off amiodarone.  - s/p DCCV for AFL--> NSR (9/23) 4. 09/04/2016 Syncope--> over-diuresis most likely.  5. HTN 6. Ascending aortic aneurysm: 4.4 cm on 7/17 echo, 4.4 cm on 5/18 echo.  - MRA chest (4/19): 4.5 cm ascending aortic aneurysm.  - MRA  chest (7/20): 4.5 cm ascending aortic aneurysm.  - MRA chest (1/22): 4.3 cm ascending aortic aneurysm (breast abnormality, recommend mammogram => consistent with benign hamartoma) - MRA chest (5/23): 4.3 cm ascending aortic aneurysm 7. Prostate cancer: Treated with radioactive seed implantation.  8. Nephrolithiasis.  9. PFTs (4/18): Mild restriction.  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/[employer]  Tobacco Use   Smoking status: Never   Smokeless tobacco: Never  Vaping Use   Vaping Use: Never used  Substance and Sexual Activity   Alcohol use: Yes    Alcohol/week: 3.0 standard drinks of alcohol    Types: 3 Cans of beer per week    Comment:  "may have 2-3 beers on the weekend"  Drug use: No   Sexual activity: Not on file  Other Topics Concern   Not on file  Social History Narrative   Not on file   Social Determinants of Health   Financial Resource Strain: Not on file  Food Insecurity: Patient Declined (10/16/2022)   Hunger Vital Sign    Worried About Running Out of Food in the Last Year: Patient declined    Ran Out of Food in the Last Year: Patient declined  Transportation Needs: No Transportation Needs (10/16/2022)   PRAPARE - Administrator, Civil Service (Medical): No    Lack of Transportation (Non-Medical): No  Physical Activity: Not on file  Stress: Not on file  Social Connections: Not on file  Intimate Partner Violence: Not At Risk (10/16/2022)   Humiliation, Afraid, Rape, and Kick questionnaire    Fear of Current or Ex-Partner: No    Emotionally Abused: No    Physically Abused: No    Sexually Abused: No    FH:  Family History  Problem  Relation Age of Onset   Heart failure Mother    Diabetes Mother    Prostate cancer Father     Current Outpatient Medications  Medication Sig Dispense Refill   acetaminophen (TYLENOL) 500 MG tablet Take 500 mg by mouth 2 (two) times daily.     allopurinol (ZYLOPRIM) 300 MG tablet Take 300 mg by mouth daily.     amoxicillin (AMOXIL) 500 MG capsule Take 2,000 mg by mouth See admin instructions. Take 2000 mg 1 hour prior to dental work     aspirin EC 81 MG tablet Take 81 mg by mouth daily. Swallow whole.     benzonatate (TESSALON) 200 MG capsule Take 200 mg by mouth 3 (three) times daily as needed for cough.     colchicine 0.6 MG tablet Take 1 tablet (0.6 mg total) by mouth daily as needed (for gout).     docusate sodium (COLACE) 100 MG capsule Take 100 mg by mouth 2 (two) times daily.     ENTRESTO 24-26 MG TAKE 1 TABLET BY MOUTH TWICE A DAY 180 tablet 2   FARXIGA 10 MG TABS tablet TAKE 1 TABLET BY MOUTH EVERY DAY BEFORE BREAKFAST 30 tablet 6   FIBER PO Take 2 capsules by mouth 2 (two) times daily.     folic acid (FOLVITE) 1 MG tablet TAKE 1 TABLET(1 MG) BY MOUTH DAILY 90 tablet 3   metoprolol succinate (TOPROL-XL) 25 MG 24 hr tablet TAKE 1 TAB BY MOUTH AT BEDTIME 30 tablet 3   polyethylene glycol powder (GLYCOLAX/MIRALAX) 17 GM/SCOOP powder Take 17 g by mouth 2 (two) times daily as needed for moderate constipation or mild constipation. 238 g 2   spironolactone (ALDACTONE) 25 MG tablet Take 1 tablet (25 mg total) by mouth daily. 90 tablet 3   tamsulosin (FLOMAX) 0.4 MG CAPS capsule Take 0.4 mg by mouth every other day.     tolterodine (DETROL LA) 4 MG 24 hr capsule Take 4 mg by mouth every other day. Alternating days with the tamsulosin     torsemide (DEMADEX) 20 MG tablet Take 2 tablets (40 mg total) by mouth daily. 60 tablet 11   warfarin (COUMADIN) 5 MG tablet Take 0.5-1 tablets (2.5-5 mg total) by mouth See admin instructions. 1/19 and 1/20  take 7.5mg  (1 and 1/2 tab) then take 1 tab ( )  daily until seen by MD  Take 1 tablet (5 mg) by mouth on Sundays, Mondays, Wednesdays, Thursdays &  Saturdays in the evening. Take 0.5 tablet (2.5 mg) by mouth on Tuesdays & Fridays in the evening. (Patient taking differently: Take 2.5-5 mg by mouth See admin instructions. Take 2.5 mg every evening with supper on Monday, Friday. 5 mg with supper all  other days.) 45 tablet 11   No current facility-administered medications for this encounter.   BP 104/66   Pulse 81   Wt 88.9 kg (196 lb)   SpO2 95%   BMI 27.34 kg/m   Wt Readings from Last 3 Encounters:  11/06/22 88.9 kg (196 lb)  10/19/22 92.3 kg (203 lb 7.8 oz)  07/10/22 85.3 kg (188 lb)   PHYSICAL EXAM: General:  NAD. No resp difficulty, walked into clinic, elderly HEENT: Normal Neck: Supple. No JVD. Carotids 2+ bilat; no bruits. No lymphadenopathy or thryomegaly appreciated. Cor: PMI nondisplaced. Regular rate & rhythm. No rubs, gallops or murmurs. + mechanical S2 Lungs: Clear Abdomen: Soft, nontender, nondistended. No hepatosplenomegaly. No bruits or masses. Good bowel sounds. Extremities: No cyanosis, clubbing, rash, edema Neuro: Alert & oriented x 3, cranial nerves grossly intact. Moves all 4 extremities w/o difficulty. Affect pleasant.  ASSESSMENT & PLAN: 1. Chronic systolic CHF: EF on TEE in 1/18 20-25%, mechanical aortic valve ok. 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  He required milrinone to assist diuresis during 1/18-2/18 admission.  Now that he is back in NSR s/p atrial fibrillation ablation, echo in 5/18 showed EF back up to 45%.  Echo in 5/19 showed mildly lower systolic function, EF 35-40%. Echo in 12/21 showed EF up to 60-65%.  However, echo 1/23 showed EF back down to 35-40% with mild RV dysfunction.  Coronary angiography in 1/23 showed nonobstructive mild CAD.  NYHA class II-IIb symptoms, he is not volume overloaded on exam.   - Continue  torsemide 40 mg daily.  BMET/BNP today. - Continue Entresto 24/26 mg bid.      - Continue Toprol XL 25 mg qhs.   - Continue Farxiga 10 mg daily. No GU symptoms. - Continue spironolactone 25 mg daily.  - Update echo. 2. Mechanical aortic valve: Continue warfarin and ASA 81. INR followed by PCP. Valve looked ok last echo.  - As above, update echo. 3. Atrial fibrillation/atrial flutter: Admitted 1/18 with atrial fibrillation with RVR and CHF.  EF down to 20-25%.  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  Need to keep him in NSR. DCCV during 2/18 admission and now s/p atrial fibrillation ablation.  In AFL at 9/23 follow up. S/p DCCV (04/05/22) to NSR. He is NSR on ECG today. - He is now off amiodarone.  He has not been noted to have atrial arrhythmias since 2018, therefore will leave off amiodarone for now. Hopefully he will remain in NSR for a prolonged period of time as he had done previously.  - Continue warfarin. No abnormal bleeding.  4. Ascending aortic aneurysm: 4.3 cm on 5/23 MRA chest. - Repeat MRA chest to follow.   Keep follow up with Dr. Shirlee Lambert next month, as scheduled. Will try to arranged echo day of follow up.   Anderson [facility] FNP-BC 11/06/2022

## 2022-11-06 NOTE — Patient Instructions (Signed)
It was great to see you today! No medication changes are needed at this time.  Labs today We will only contact you if something comes back abnormal or we need to make some changes. Otherwise no news is good news!  You have been referred to [facility] for MRA of Chest -they will be in contact with appointment once approved by insurance  Keep cardiology follow up as scheduled with Dr Shirlee Latch  Do the following things EVERYDAY: Weigh yourself in the morning before breakfast. Write it down and keep it in a log. Take your medicines as prescribed Eat low salt foods--Limit salt (sodium) to 2000 mg per day.  Stay as active as you can everyday Limit all fluids for the day to less than 2 liters  At the [facility], you and your health needs are our priority. As part of our continuing mission to provide you with exceptional heart care, we have created designated Provider Care Teams. These Care Teams include your primary Cardiologist (physician) and Advanced Practice Providers (APPs- Physician Assistants and Nurse Practitioners) who all work together to provide you with the care you need, when you need it.   You may see any of the following providers on your designated Care Team at your next follow up: Dr Arvilla Meres Dr Marca Ancona Dr. Marcos Eke, NP Robbie Lis, [place] [facility] [place], [place] Brynda Peon, NP Karle Plumber, PharmD   Please be sure to bring in all your medications bottles to every appointment.    Thank you for choosing [facility]  If you have any questions or concerns before your next appointment please send [place] a message through [place] or call our office at [phone].    TO LEAVE A MESSAGE FOR THE NURSE SELECT OPTION 2, PLEASE LEAVE A MESSAGE INCLUDING: YOUR NAME DATE OF BIRTH CALL BACK NUMBER REASON FOR CALL**this is important as we prioritize the call backs  YOU  WILL RECEIVE A CALL BACK THE SAME DAY AS LONG AS YOU CALL BEFORE 4:00 PM

## 2022-11-08 ENCOUNTER — Telehealth (HOSPITAL_COMMUNITY): Payer: Self-pay | Admitting: Vascular Surgery

## 2022-11-08 ENCOUNTER — Telehealth: Payer: Self-pay | Admitting: *Deleted

## 2022-11-08 NOTE — Telephone Encounter (Signed)
LVM gving MRA chest appt 4/25 @ 430 pm

## 2022-11-08 NOTE — Telephone Encounter (Signed)
MRA chest auth  Order ID: [id number]       Authorized  Approval Valid Through: 11/08/2022 - 12/07/2022

## 2022-11-13 ENCOUNTER — Ambulatory Visit (HOSPITAL_COMMUNITY)
Admission: RE | Admit: 2022-11-13 | Discharge: 2022-11-13 | Disposition: A | Discharge status: Home / Self Care | Payer: Medicare Other | Source: Ambulatory Visit | Attending: Cardiology | Admitting: Cardiology

## 2022-11-13 ENCOUNTER — Other Ambulatory Visit (HOSPITAL_COMMUNITY): Payer: Self-pay

## 2022-11-13 ENCOUNTER — Telehealth (HOSPITAL_COMMUNITY): Payer: Self-pay

## 2022-11-13 DIAGNOSIS — I5022 Chronic systolic (congestive) heart failure: Secondary | ICD-10-CM

## 2022-11-13 LAB — BASIC METABOLIC PANEL
Anion gap: 8 (ref 5–15)
BUN: 28 mg/dL — ABNORMAL HIGH (ref 8–23)
CO2: 26 mmol/L (ref 22–32)
Calcium: 9.2 mg/dL (ref 8.9–10.3)
Chloride: 101 mmol/L (ref 98–111)
Creatinine, Ser: 1.38 mg/dL — ABNORMAL HIGH (ref 0.61–1.24)
GFR, Estimated: 51 mL/min — ABNORMAL LOW (ref >60)
Glucose, Bld: 114 mg/dL — ABNORMAL HIGH (ref 70–99)
Potassium: 5.2 mmol/L — ABNORMAL HIGH (ref 3.5–5.1)
Sodium: 135 mmol/L (ref 135–145)

## 2022-11-13 NOTE — Telephone Encounter (Signed)
Patient aware of labs. Labs ordered and scheduled. 

## 2022-11-15 DIAGNOSIS — M75122 Complete rotator cuff tear or rupture of left shoulder, not specified as traumatic: Secondary | ICD-10-CM | POA: Diagnosis not present

## 2022-11-16 ENCOUNTER — Ambulatory Visit (HOSPITAL_COMMUNITY)
Admission: RE | Admit: 2022-11-16 | Discharge: 2022-11-16 | Disposition: A | Discharge status: Home / Self Care | Payer: Medicare Other | Source: Ambulatory Visit | Attending: Cardiology | Admitting: Cardiology

## 2022-11-16 DIAGNOSIS — J310 Chronic rhinitis: Secondary | ICD-10-CM | POA: Diagnosis not present

## 2022-11-16 DIAGNOSIS — M109 Gout, unspecified: Secondary | ICD-10-CM | POA: Diagnosis not present

## 2022-11-16 DIAGNOSIS — E785 Hyperlipidemia, unspecified: Secondary | ICD-10-CM | POA: Diagnosis not present

## 2022-11-16 DIAGNOSIS — I5043 Acute on chronic combined systolic (congestive) and diastolic (congestive) heart failure: Secondary | ICD-10-CM | POA: Diagnosis not present

## 2022-11-16 DIAGNOSIS — Z8546 Personal history of malignant neoplasm of prostate: Secondary | ICD-10-CM | POA: Diagnosis not present

## 2022-11-16 DIAGNOSIS — R918 Other nonspecific abnormal finding of lung field: Secondary | ICD-10-CM | POA: Diagnosis not present

## 2022-11-16 DIAGNOSIS — Z7901 Long term (current) use of anticoagulants: Secondary | ICD-10-CM | POA: Diagnosis not present

## 2022-11-16 DIAGNOSIS — M6281 Muscle weakness (generalized): Secondary | ICD-10-CM | POA: Diagnosis not present

## 2022-11-16 DIAGNOSIS — I7121 Aneurysm of the ascending aorta, without rupture: Secondary | ICD-10-CM | POA: Diagnosis not present

## 2022-11-16 DIAGNOSIS — J189 Pneumonia, unspecified organism: Secondary | ICD-10-CM | POA: Diagnosis not present

## 2022-11-16 DIAGNOSIS — D6869 Other thrombophilia: Secondary | ICD-10-CM | POA: Diagnosis not present

## 2022-11-16 DIAGNOSIS — A419 Sepsis, unspecified organism: Secondary | ICD-10-CM | POA: Diagnosis not present

## 2022-11-16 DIAGNOSIS — N3281 Overactive bladder: Secondary | ICD-10-CM | POA: Diagnosis not present

## 2022-11-16 DIAGNOSIS — I452 Bifascicular block: Secondary | ICD-10-CM | POA: Diagnosis not present

## 2022-11-16 DIAGNOSIS — I714 Abdominal aortic aneurysm, without rupture, unspecified: Secondary | ICD-10-CM

## 2022-11-16 DIAGNOSIS — N400 Enlarged prostate without lower urinary tract symptoms: Secondary | ICD-10-CM | POA: Diagnosis not present

## 2022-11-16 DIAGNOSIS — Z952 Presence of prosthetic heart valve: Secondary | ICD-10-CM | POA: Diagnosis not present

## 2022-11-16 DIAGNOSIS — I480 Paroxysmal atrial fibrillation: Secondary | ICD-10-CM | POA: Diagnosis not present

## 2022-11-16 DIAGNOSIS — I110 Hypertensive heart disease with heart failure: Secondary | ICD-10-CM | POA: Diagnosis not present

## 2022-11-16 MED ORDER — GADOBUTROL 1 MMOL/ML IV SOLN
8.0000 mL | Freq: Once | INTRAVENOUS | Status: AC | PRN
  Administered 2022-11-16: 8 mL via INTRAVENOUS

## 2022-11-17 DIAGNOSIS — Z7901 Long term (current) use of anticoagulants: Secondary | ICD-10-CM | POA: Diagnosis not present

## 2022-11-21 ENCOUNTER — Other Ambulatory Visit (HOSPITAL_COMMUNITY): Payer: Medicare Other

## 2022-11-22 ENCOUNTER — Ambulatory Visit (HOSPITAL_COMMUNITY)
Admission: RE | Admit: 2022-11-22 | Discharge: 2022-11-22 | Disposition: A | Discharge status: Home / Self Care | Payer: Medicare Other | Source: Ambulatory Visit | Attending: Cardiology | Admitting: Cardiology

## 2022-11-22 ENCOUNTER — Encounter (HOSPITAL_COMMUNITY): Payer: Self-pay | Admitting: Cardiology

## 2022-11-22 VITALS — BP 90/58 | HR 88 | Wt 197.2 lb

## 2022-11-22 DIAGNOSIS — I110 Hypertensive heart disease with heart failure: Secondary | ICD-10-CM | POA: Diagnosis not present

## 2022-11-22 DIAGNOSIS — Z8249 Family history of ischemic heart disease and other diseases of the circulatory system: Secondary | ICD-10-CM | POA: Insufficient documentation

## 2022-11-22 DIAGNOSIS — I4892 Unspecified atrial flutter: Secondary | ICD-10-CM | POA: Diagnosis not present

## 2022-11-22 DIAGNOSIS — I5022 Chronic systolic (congestive) heart failure: Secondary | ICD-10-CM | POA: Insufficient documentation

## 2022-11-22 DIAGNOSIS — Z7982 Long term (current) use of aspirin: Secondary | ICD-10-CM | POA: Insufficient documentation

## 2022-11-22 DIAGNOSIS — I4891 Unspecified atrial fibrillation: Secondary | ICD-10-CM | POA: Insufficient documentation

## 2022-11-22 DIAGNOSIS — Z952 Presence of prosthetic heart valve: Secondary | ICD-10-CM | POA: Diagnosis not present

## 2022-11-22 DIAGNOSIS — E669 Obesity, unspecified: Secondary | ICD-10-CM | POA: Insufficient documentation

## 2022-11-22 DIAGNOSIS — Z7984 Long term (current) use of oral hypoglycemic drugs: Secondary | ICD-10-CM | POA: Insufficient documentation

## 2022-11-22 DIAGNOSIS — Z7901 Long term (current) use of anticoagulants: Secondary | ICD-10-CM | POA: Diagnosis not present

## 2022-11-22 DIAGNOSIS — R0602 Shortness of breath: Secondary | ICD-10-CM | POA: Diagnosis not present

## 2022-11-22 DIAGNOSIS — Z79899 Other long term (current) drug therapy: Secondary | ICD-10-CM | POA: Insufficient documentation

## 2022-11-22 DIAGNOSIS — I10 Essential (primary) hypertension: Secondary | ICD-10-CM | POA: Insufficient documentation

## 2022-11-22 DIAGNOSIS — Z833 Family history of diabetes mellitus: Secondary | ICD-10-CM | POA: Insufficient documentation

## 2022-11-22 DIAGNOSIS — I7121 Aneurysm of the ascending aorta, without rupture: Secondary | ICD-10-CM | POA: Diagnosis not present

## 2022-11-22 MED ORDER — TORSEMIDE 20 MG PO TABS: ORAL_TABLET | ORAL | 11 refills | Status: DC

## 2022-11-22 NOTE — Progress Notes (Signed)
PCP: [name], MD Cardiology: Dr. [place]  HF Cardiology: Dr. Shirlee Latch   HPI: Adrian Lambert is a 82 y.o. male with a history of severe AS s/p St Jude mechanical AVR in 03/2003 on chronic coumadin, HTN, obesity, and mild LV dysfxn  EF 40-45% on 01/2016 echo.  Prior to admit in 1/18, he had a cough and dyspnea that was thought to be bronchitis so he was treated with abx and oral steroids. He was seen in cardiology office in 1/18 and was noted to be in rapid atrial fibrillation with significant volume overload.   Admitted 1/23 through 08/29/16 with marked volume overload, lower extremity edema, and atrial fibrillation with RVR.  Initial cardioversion failed and he was difficult to diurese. He was placed on milrinone for low output and diuresed with IV lasix with good response.  Weight came down considerably. Amiodarone was begun. Once fully diuresed, he had successful DC-CV on 08/28/16. Discharged on torsemide 60 mg daily. Discharge weight 212 pounds. Echo and TEE in 1/18 had shown fall in EF to around 25%.   He continued to lose weight as an outpatient.  He had a syncopal episode where he stood up from sitting and got lightheaded with transient loss of consciousness.  He had been having orthostatic symptoms.  We saw him in the office and cut back on torsemide from 60 mg daily down eventually to 20 mg daily.  Creatinine up some to 1.7 at that time.    At a prior appt, he was back in atrial fibrillation.  He was set up for DCCV in 3/18, but was in NSR when he arrived to short stay. He had atrial fibrillation ablation with Dr. Elberta Fortis in 5/18.   Echo was done in 5/18, EF up to 45% with mild diffuse hypokinesis, normal mechanical aortic valve, mildly decreased RV systolic function.    Echo was repeated in 5/19, EF 35-40%, diffuse hypokinesis, mildly decreased RV systolic function, stable mechanical aortic valve.   Echo in 11/21 showed EF up to 50-55%, normal RV size and systolic function, mechanical aortic  valve looks ok, normal IVC, ascending aorta 4.2 cm. Echo in 12/21 showed EF 60-65%, mild RV enlargement, normal RV function, mechanical aortic valve mean gradient 10 mmHg, ascending aorta 4.1 cm.  MRA chest in 1/22 showed 4.3 cm ascending aorta.   Echo 1/23 showed EF 35-40%, global hypokinesis, mildly decreased RV systolic function, normal mechanical aortic valve, moderate Adrian, PASP 51 mmHg. With EF back down and mild volume overload, he was started on Farxiga, torsemide increased and R/LHC arranged.  R/LHC (1/23) showed mild elevated PCWP with mild pulmonary venous hypertension, preserved CO and minimal CAD.  Follow up 9/23, found to be in AFL with increased fatige and mild volume overload. Cleda Daub increased to 25 mg daily and started on torsemide 20 mg daily. Arranged for DCCV, s/p DCCV 04/05/22 to NSR.  Admitted 3/24 with CHF and multifocal pneumonia. Diuresed with IV lasix and received IV abx. Transitioned to higher dose of torsemide, 40 mg. He was discharged home, weight 203 lbs.  Today he returns for post hospital HF follow up. He is currently taking torsemide 20 mg daily.  Weight is up 1 lb. He is in NSR today. He still gets short of breath walking up stairs or walking a long distance on flat ground. He has bendopnea.  No orthopnea/PND.  No lightheadedness.    ECG (personally reviewed): NSR, iRBBB, 1st degree AVB (238 msec), LAFB, inferior and anterior Qs  Labs (  3/19): K 4.7,creatinine 0.99 Labs (5/19): K 4.6, creatinine 0.98 Labs (9/19): K 4.2, creatinine 0.91 Labs (12/19): K 4.6, creatinine 1.14 Labs (3/20): K 4.6, creatinine 0.98 Labs (7/20): creatinine 1.07 Labs (9/21): K 5, creatinine 1.13 Labs (12/21): LDL 77, K 4.1, creatinine 1.61 Labs (6/22): K 4.8, creatinine 1.02 Labs (10/22): K 4.9, creatinine 1.02 Labs (11/22): K 4.9, creatinine 1.02 Labs (1/23): K 4.5, creatinine 1.18 Labs (2/23): K 4.8, creatinine 1.29 Labs (5/23): LDL 92 Labs (6/23): K 4.7, creatinine 1.16, BNP  521 Labs (9/23): K 4.7, creatinine 1.24 Labs (3/24): K 4.3, creatinine 1.28 Labs (4/24): K 5.2, creatinine 1.38, BNP 840  PMH: 1. Aortic stenosis: # 25 St Jude mechanical aortic valve in 9/14.   2. Chronic systolic CHF: Echo in 7/17 with EF 45-50%.  Admitted in 1/18 with afib with RVR, ?tachycardia-mediated cardiomyopathy.  - Echo (1/18) with EF 25-30%, stable mechanical aortic valve.  - TEE (1/18) with EF 20-25%, stable mechanical aortic valve, moderate Adrian, small PFO.  - Admission 1/18 with acute on chronic systolic CHF, required milrinone, extensively diuresed.  - Echo (5/18) with EF 45%, diffuse hypokinesis, normal RV size with mildly decreased systolic function, mechanical aortic valve with mean gradient 14 mmHg, ascending aorta 4.4 cm.  - Echo (5/19) with EF 35-40%, diffuse hypokinesis, mild LVH, mildly decreased RV systolic function, mechanical aortic valve looked ok.   - Echo (11/20) with EF up to 50-55%, normal RV size and systolic function, mechanical aortic valve looks ok, normal IVC, ascending aorta 4.2 cm.  - Echo (12/21) with EF 60-65%, mild RV enlargement, normal RV function, mechanical aortic valve mean gradient 10 mmHg, ascending aorta 4.1 cm. - Echo (1/23) with EF 35-40%, global hypokinesis, mildly decreased RV systolic function, normal mechanical aortic valve, moderate Adrian, PASP 51 mmHg.  - L/RHC (1/23): minimal CAD, RA mean 6, PA 35/18 (25) PCWP mean 17, preserved CO/CI 4.87/2.39, PVR 1.6 WU. 3. Atrial fibrillation: First noted in 1/18, uncertain how long it had been present (with RVR).  08/28/2016 Successful DC-CV --> NSR.  Back in atrial fibrillation at 09/18/16 office visit.  Set up for DCCV in 3/18 but back in NSR.   - Atrial fibrillation ablation in 5/18. Now off amiodarone.  - s/p DCCV for AFL--> NSR (9/23) 4. 09/04/2016 Syncope--> over-diuresis most likely.  5. HTN 6. Ascending aortic aneurysm: 4.4 cm on 7/17 echo, 4.4 cm on 5/18 echo.  - MRA chest (4/19): 4.5 cm ascending  aortic aneurysm.  - MRA chest (7/20): 4.5 cm ascending aortic aneurysm.  - MRA chest (1/22): 4.3 cm ascending aortic aneurysm (breast abnormality, recommend mammogram => consistent with benign hamartoma) - MRA chest (5/23): 4.3 cm ascending aortic aneurysm - MRA chest (4/24): 4.3 cm ascending aortic aneurysm 7. Prostate cancer: Treated with radioactive seed implantation.  8. Nephrolithiasis.  9. PFTs (4/18): Mild restriction.  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/[employer]  Tobacco Use   Smoking status: Never   Smokeless tobacco: Never  Vaping Use   Vaping Use: Never used  Substance and Sexual Activity   Alcohol use: Yes    Alcohol/week: 3.0 standard  drinks of alcohol    Types: 3 Cans of beer per week    Comment:  "may have 2-3 beers on the weekend"   Drug use: No   Sexual activity: Not on file  Other Topics Concern   Not on file  Social History Narrative   Not on file   Social Determinants of Health   Financial Resource Strain: Not on file  Food Insecurity: Patient Declined (10/16/2022)   Hunger Vital Sign    Worried About Running Out of Food in the Last Year: Patient declined    Ran Out of Food in the Last Year: Patient declined  Transportation Needs: No Transportation Needs (10/16/2022)   PRAPARE - Administrator, Civil Service (Medical): No    Lack of Transportation (Non-Medical): No  Physical Activity: Not on file  Stress: Not on file  Social Connections: Not on file  Intimate Partner Violence: Not At Risk (10/16/2022)   Humiliation, Afraid, Rape, and Kick questionnaire    Fear of Current or Ex-Partner: No    Emotionally Abused: No     Physically Abused: No    Sexually Abused: No    FH:  Family History  Problem Relation Age of Onset   Heart failure Mother    Diabetes Mother    Prostate cancer Father     Current Outpatient Medications  Medication Sig Dispense Refill   acetaminophen (TYLENOL) 500 MG tablet Take 500 mg by mouth 2 (two) times daily.     allopurinol (ZYLOPRIM) 300 MG tablet Take 150 mg by mouth daily.     amoxicillin (AMOXIL) 500 MG capsule Take 2,000 mg by mouth See admin instructions. Take 2000 mg 1 hour prior to dental work     aspirin EC 81 MG tablet Take 81 mg by mouth daily. Swallow whole.     benzonatate (TESSALON) 200 MG capsule Take 200 mg by mouth 3 (three) times daily as needed for cough.     colchicine 0.6 MG tablet Take 1 tablet (0.6 mg total) by mouth daily as needed (for gout).     docusate sodium (COLACE) 100 MG capsule Take 100 mg by mouth 2 (two) times daily.     ENTRESTO 24-26 MG TAKE 1 TABLET BY MOUTH TWICE A DAY 180 tablet 2   FARXIGA 10 MG TABS tablet TAKE 1 TABLET BY MOUTH EVERY DAY BEFORE BREAKFAST 30 tablet 6   FIBER PO Take 2 capsules by mouth 2 (two) times daily.     folic acid (FOLVITE) 1 MG tablet TAKE 1 TABLET(1 MG) BY MOUTH DAILY 90 tablet 3   metoprolol succinate (TOPROL-XL) 25 MG 24 hr tablet TAKE 1 TAB BY MOUTH AT BEDTIME 30 tablet 3   polyethylene glycol powder (GLYCOLAX/MIRALAX) 17 GM/SCOOP powder Take 17 g by mouth 2 (two) times daily as needed for moderate constipation or mild constipation. 238 g 2   spironolactone (ALDACTONE) 25 MG tablet Take 1 tablet (25 mg total) by mouth daily. 90 tablet 3   tamsulosin (FLOMAX) 0.4 MG CAPS capsule Take 0.4 mg by mouth every other day.     tolterodine (DETROL LA) 4 MG 24 hr capsule Take 4 mg by mouth every other day. Alternating days with the tamsulosin     warfarin (COUMADIN) 5 MG tablet Take 0.5-1 tablets (2.5-5 mg total) by mouth See admin instructions. 1/19 and 1/20  take 7.5mg  (1 and 1/2 tab) then take 1 tab (5mg ) daily until  seen by MD  Take 1 tablet (5 mg)  by mouth on Sundays, Mondays, Wednesdays, Thursdays & Saturdays in the evening. Take 0.5 tablet (2.5 mg) by mouth on Tuesdays & Fridays in the evening. (Patient taking differently: Take 2.5-5 mg by mouth See admin instructions. Take 2.5 mg every evening with supper on Monday, Friday. 5 mg with supper all  other days.) 45 tablet 11   torsemide (DEMADEX) 20 MG tablet 40 mg ( 2 Tab) daily, Alternating with 20 mg daily 60 tablet 11   No current facility-administered medications for this encounter.   BP (!) 90/58   Pulse 88   Wt 89.4 kg (197 lb 3.2 oz)   SpO2 97%   BMI 27.50 kg/m   Wt Readings from Last 3 Encounters:  11/22/22 89.4 kg (197 lb 3.2 oz)  11/06/22 88.9 kg (196 lb)  10/19/22 92.3 kg (203 lb 7.8 oz)   PHYSICAL EXAM: General: NAD Neck: JVP 8-9 cm, no thyromegaly or thyroid nodule.  Lungs: Clear to auscultation bilaterally with normal respiratory effort. CV: Nondisplaced PMI.  Heart regular S1/S2, no S3/S4, no murmur.  1+ ankle edema.  No carotid bruit.  Normal pedal pulses.  Abdomen: Soft, nontender, no hepatosplenomegaly, no distention.  Skin: Intact without lesions or rashes.  Neurologic: Alert and oriented x 3.  Psych: Normal affect. Extremities: No clubbing or cyanosis.  HEENT: Normal.   ASSESSMENT & PLAN: 1. Chronic systolic CHF: EF on TEE in 1/18 20-25%, mechanical aortic valve ok. 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  He required milrinone to assist diuresis during 1/18-2/18 admission.  Now that he is back in NSR s/p atrial fibrillation ablation, echo in 5/18 showed EF back up to 45%.  Echo in 5/19 showed mildly lower systolic function, EF 35-40%. Echo in 12/21 showed EF up to 60-65%.  However, echo 1/23 showed EF back down to 35-40% with mild RV dysfunction.  Coronary angiography in 1/23 showed nonobstructive mild CAD.  NYHA class II-III.  He is mildly  volume overloaded on exam.  Low BP today limits uptitration of GDMT.  - Increase torsemide to 40 mg daily alternating with 20 mg daily.  BMET in 10 days.  - Continue Entresto 24/26 mg bid.      - Continue Toprol XL 25 mg qhs.   - Continue Farxiga 10 mg daily.  - Continue spironolactone 25 mg daily.  - I will arrange for echo.  2. Mechanical aortic valve: Continue warfarin and ASA 81. INR followed by PCP. Valve looked ok last echo.  - As above, repeat echo.  - He will need antibiotic prophylaxis with dental work.  3. Atrial fibrillation/atrial flutter: Admitted 1/18 with atrial fibrillation with RVR and CHF.  EF down to 20-25%.  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  Need to keep him in NSR. DCCV during 2/18 admission and now s/p atrial fibrillation ablation.  In AFL at 9/23 follow up, DCCV to NSR. He is NSR on ECG today. - He is now off amiodarone.   - Continue warfarin.  4. Ascending aortic aneurysm: 4.3 cm on 4/24 MRA chest.  Followup 6 wks with APP.   Adrian Lambert  11/22/2022

## 2022-11-22 NOTE — Patient Instructions (Signed)
INCREASE Torsemide to 40 mg ( 2 Tab) daily, alternating with 20 mg daily.  Blood work in 10 days.  Your physician has requested that you have an echocardiogram. Echocardiography is a painless test that uses sound waves to create images of your heart. It provides your doctor with information about the size and shape of your heart and how well your heart's chambers and valves are working. This procedure takes approximately one hour. There are no restrictions for this procedure. Please do NOT wear cologne, perfume, aftershave, or lotions (deodorant is allowed). Please arrive 15 minutes prior to your appointment time.  Your physician recommends that you schedule a follow-up appointment in: 6 weeks  Need to Contact us:  If you have any questions or concerns before your next appointment please send [place] a message through [place] or call our office at [phone].    TO LEAVE A MESSAGE FOR THE NURSE SELECT OPTION 2, PLEASE LEAVE A MESSAGE INCLUDING: YOUR NAME DATE OF BIRTH CALL BACK NUMBER REASON FOR CALL**this is important as we prioritize the call backs  YOU WILL RECEIVE A CALL BACK THE SAME DAY AS LONG AS YOU CALL BEFORE 4:00 PM  At the [facility], you and your health needs are our priority. As part of our continuing mission to provide you with exceptional heart care, we have created designated Provider Care Teams. These Care Teams include your primary Cardiologist (physician) and Advanced Practice Providers (APPs- Physician Assistants and Nurse Practitioners) who all work together to provide you with the care you need, when you need it.   You may see any of the following providers on your designated Care Team at your next follow up: Dr Arvilla Meres Dr Marca Ancona Dr. Marcos Eke, NP Robbie Lis, [place] [facility] [place], [place] Brynda Peon, NP Karle Plumber, PharmD   Please be sure to bring in all your medications bottles to every  appointment.    Thank you for choosing [facility]

## 2022-11-23 DIAGNOSIS — M75121 Complete rotator cuff tear or rupture of right shoulder, not specified as traumatic: Secondary | ICD-10-CM | POA: Diagnosis not present

## 2022-11-28 DIAGNOSIS — M75121 Complete rotator cuff tear or rupture of right shoulder, not specified as traumatic: Secondary | ICD-10-CM | POA: Diagnosis not present

## 2022-11-30 DIAGNOSIS — M75121 Complete rotator cuff tear or rupture of right shoulder, not specified as traumatic: Secondary | ICD-10-CM | POA: Diagnosis not present

## 2022-12-05 ENCOUNTER — Ambulatory Visit (HOSPITAL_COMMUNITY)
Admission: RE | Admit: 2022-12-05 | Discharge: 2022-12-05 | Disposition: A | Discharge status: Home / Self Care | Payer: Medicare Other | Source: Ambulatory Visit | Attending: Cardiology | Admitting: Cardiology

## 2022-12-05 ENCOUNTER — Other Ambulatory Visit (HOSPITAL_COMMUNITY): Payer: Medicare Other

## 2022-12-05 DIAGNOSIS — I5022 Chronic systolic (congestive) heart failure: Secondary | ICD-10-CM | POA: Diagnosis not present

## 2022-12-05 DIAGNOSIS — M75121 Complete rotator cuff tear or rupture of right shoulder, not specified as traumatic: Secondary | ICD-10-CM | POA: Diagnosis not present

## 2022-12-05 LAB — BASIC METABOLIC PANEL
Anion gap: 11 (ref 5–15)
BUN: 37 mg/dL — ABNORMAL HIGH (ref 8–23)
CO2: 26 mmol/L (ref 22–32)
Calcium: 9 mg/dL (ref 8.9–10.3)
Chloride: 99 mmol/L (ref 98–111)
Creatinine, Ser: 1.44 mg/dL — ABNORMAL HIGH (ref 0.61–1.24)
GFR, Estimated: 49 mL/min — ABNORMAL LOW (ref >60)
Glucose, Bld: 112 mg/dL — ABNORMAL HIGH (ref 70–99)
Potassium: 4.7 mmol/L (ref 3.5–5.1)
Sodium: 136 mmol/L (ref 135–145)

## 2022-12-05 LAB — BRAIN NATRIURETIC PEPTIDE: B Natriuretic Peptide: 933.3 pg/mL — ABNORMAL HIGH (ref 0.0–100.0)

## 2022-12-07 DIAGNOSIS — N1831 Chronic kidney disease, stage 3a: Secondary | ICD-10-CM | POA: Diagnosis not present

## 2022-12-07 DIAGNOSIS — Z8739 Personal history of other diseases of the musculoskeletal system and connective tissue: Secondary | ICD-10-CM | POA: Diagnosis not present

## 2022-12-07 DIAGNOSIS — M75121 Complete rotator cuff tear or rupture of right shoulder, not specified as traumatic: Secondary | ICD-10-CM | POA: Diagnosis not present

## 2022-12-07 DIAGNOSIS — Z6830 Body mass index (BMI) 30.0-30.9, adult: Secondary | ICD-10-CM | POA: Diagnosis not present

## 2022-12-07 DIAGNOSIS — I7121 Aneurysm of the ascending aorta, without rupture: Secondary | ICD-10-CM | POA: Diagnosis not present

## 2022-12-07 DIAGNOSIS — Z7901 Long term (current) use of anticoagulants: Secondary | ICD-10-CM | POA: Diagnosis not present

## 2022-12-12 DIAGNOSIS — M75121 Complete rotator cuff tear or rupture of right shoulder, not specified as traumatic: Secondary | ICD-10-CM | POA: Diagnosis not present

## 2022-12-19 DIAGNOSIS — M75121 Complete rotator cuff tear or rupture of right shoulder, not specified as traumatic: Secondary | ICD-10-CM | POA: Diagnosis not present

## 2022-12-20 DIAGNOSIS — I5022 Chronic systolic (congestive) heart failure: Secondary | ICD-10-CM | POA: Diagnosis not present

## 2022-12-20 DIAGNOSIS — I10 Essential (primary) hypertension: Secondary | ICD-10-CM | POA: Diagnosis not present

## 2022-12-20 DIAGNOSIS — E782 Mixed hyperlipidemia: Secondary | ICD-10-CM | POA: Diagnosis not present

## 2022-12-20 DIAGNOSIS — N1831 Chronic kidney disease, stage 3a: Secondary | ICD-10-CM | POA: Diagnosis not present

## 2022-12-21 DIAGNOSIS — M75121 Complete rotator cuff tear or rupture of right shoulder, not specified as traumatic: Secondary | ICD-10-CM | POA: Diagnosis not present

## 2023-01-04 ENCOUNTER — Ambulatory Visit (HOSPITAL_COMMUNITY): Payer: Medicare Other

## 2023-01-04 ENCOUNTER — Encounter (HOSPITAL_COMMUNITY): Payer: Medicare Other

## 2023-01-16 NOTE — Progress Notes (Signed)
PCP: [name], MD Cardiology: Dr. [place]  HF Cardiology: Dr. Shirlee Latch   HPI: Adrian Lambert is a 82 y.o. male with a history of severe AS s/p St Jude mechanical AVR in 03/2003 on chronic coumadin, HTN, obesity, and mild LV dysfxn  EF 40-45% on 01/2016 echo.  Prior to admit in 1/18, he had a cough and dyspnea that was thought to be bronchitis so he was treated with abx and oral steroids. He was seen in cardiology office in 1/18 and was noted to be in rapid atrial fibrillation with significant volume overload.   Admitted 1/23 through 08/29/16 with marked volume overload, lower extremity edema, and atrial fibrillation with RVR.  Initial cardioversion failed and he was difficult to diurese. He was placed on milrinone for low output and diuresed with IV lasix with good response.  Weight came down considerably. Amiodarone was begun. Once fully diuresed, he had successful DC-CV on 08/28/16. Discharged on torsemide 60 mg daily. Discharge weight 212 pounds. Echo and TEE in 1/18 had shown fall in EF to around 25%.   He continued to lose weight as an outpatient.  He had a syncopal episode where he stood up from sitting and got lightheaded with transient loss of consciousness.  He had been having orthostatic symptoms.  We saw him in the office and cut back on torsemide from 60 mg daily down eventually to 20 mg daily.  Creatinine up some to 1.7 at that time.    At a prior appt, he was back in atrial fibrillation.  He was set up for DCCV in 3/18, but was in NSR when he arrived to short stay. He had atrial fibrillation ablation with Dr. Elberta Fortis in 5/18.   Echo was done in 5/18, EF up to 45% with mild diffuse hypokinesis, normal mechanical aortic valve, mildly decreased RV systolic function.    Echo was repeated in 5/19, EF 35-40%, diffuse hypokinesis, mildly decreased RV systolic function, stable mechanical aortic valve.   Echo in 11/21 showed EF up to 50-55%, normal RV size and systolic function, mechanical aortic  valve looks ok, normal IVC, ascending aorta 4.2 cm. Echo in 12/21 showed EF 60-65%, mild RV enlargement, normal RV function, mechanical aortic valve mean gradient 10 mmHg, ascending aorta 4.1 cm.  MRA chest in 1/22 showed 4.3 cm ascending aorta.   Echo 1/23 showed EF 35-40%, global hypokinesis, mildly decreased RV systolic function, normal mechanical aortic valve, moderate Adrian, PASP 51 mmHg. With EF back down and mild volume overload, he was started on Farxiga, torsemide increased and R/LHC arranged.  R/LHC (1/23) showed mild elevated PCWP with mild pulmonary venous hypertension, preserved CO and minimal CAD.  Follow up 9/23, found to be in AFL with increased fatige and mild volume overload. Cleda Daub increased to 25 mg daily and started on torsemide 20 mg daily. Arranged for DCCV, s/p DCCV 04/05/22 to NSR.  Admitted 3/24 with CHF and multifocal pneumonia. Diuresed with IV lasix and received IV abx. Transitioned to higher dose of torsemide, 40 mg. He was discharged home, weight 203 lbs.  Follow up 5/24, NYHA II-III symptoms, mildly volume overloaded. Torsemide increased and repeat echo arranged.  Today he returns for HF follow up. Overall feeling fine. He has SOB walking up steps, does OK walking on flat ground. Has some ankle swelling. He has positional dizziness, balance is poor. Denies palpitations, abnormal bleeding, CP, dizziness, or PND/Orthopnea. Appetite ok. No fever or chills. Weight at home 189 pounds. Has been off fluid pills x  1 week due to frequent family events. Previously was taking torsemide 20 mg daily alter with 10 mg every other day.   Echo today 01/16/23 appears EF about the same as previus, 30%, but awaiting MD interpretation.  ReDs: 42%  ECG (personally reviewed): none ordered today.  Labs (3/19): K 4.7,creatinine 0.99 Labs (5/19): K 4.6, creatinine 0.98 Labs (9/19): K 4.2, creatinine 0.91 Labs (12/19): K 4.6, creatinine 1.14 Labs (3/20): K 4.6, creatinine 0.98 Labs (7/20):  creatinine 1.07 Labs (9/21): K 5, creatinine 1.13 Labs (12/21): LDL 77, K 4.1, creatinine 1.61 Labs (6/22): K 4.8, creatinine 1.02 Labs (10/22): K 4.9, creatinine 1.02 Labs (11/22): K 4.9, creatinine 1.02 Labs (1/23): K 4.5, creatinine 1.18 Labs (2/23): K 4.8, creatinine 1.29 Labs (5/23): LDL 92 Labs (6/23): K 4.7, creatinine 1.16, BNP 521 Labs (9/23): K 4.7, creatinine 1.24 Labs (3/24): K 4.3, creatinine 1.28 Labs (4/24): K 5.2, creatinine 1.38, BNP 840 Labs (5/24): K 4.7, creatinine 1.44  PMH: 1. Aortic stenosis: # 25 St Jude mechanical aortic valve in 9/14.   2. Chronic systolic CHF: Echo in 7/17 with EF 45-50%.  Admitted in 1/18 with afib with RVR, ?tachycardia-mediated cardiomyopathy.  - Echo (1/18) with EF 25-30%, stable mechanical aortic valve.  - TEE (1/18) with EF 20-25%, stable mechanical aortic valve, moderate Adrian, small PFO.  - Admission 1/18 with acute on chronic systolic CHF, required milrinone, extensively diuresed.  - Echo (5/18) with EF 45%, diffuse hypokinesis, normal RV size with mildly decreased systolic function, mechanical aortic valve with mean gradient 14 mmHg, ascending aorta 4.4 cm.  - Echo (5/19) with EF 35-40%, diffuse hypokinesis, mild LVH, mildly decreased RV systolic function, mechanical aortic valve looked ok.   - Echo (11/20) with EF up to 50-55%, normal RV size and systolic function, mechanical aortic valve looks ok, normal IVC, ascending aorta 4.2 cm.  - Echo (12/21) with EF 60-65%, mild RV enlargement, normal RV function, mechanical aortic valve mean gradient 10 mmHg, ascending aorta 4.1 cm. - Echo (1/23) with EF 35-40%, global hypokinesis, mildly decreased RV systolic function, normal mechanical aortic valve, moderate Adrian, PASP 51 mmHg.  - L/RHC (1/23): minimal CAD, RA mean 6, PA 35/18 (25) PCWP mean 17, preserved CO/CI 4.87/2.39, PVR 1.6 WU. 3. Atrial fibrillation: First noted in 1/18, uncertain how long it had been present (with RVR).  08/28/2016  Successful DC-CV --> NSR.  Back in atrial fibrillation at 09/18/16 office visit.  Set up for DCCV in 3/18 but back in NSR.   - Atrial fibrillation ablation in 5/18. Now off amiodarone.  - s/p DCCV for AFL--> NSR (9/23) 4. 09/04/2016 Syncope--> over-diuresis most likely.  5. HTN 6. Ascending aortic aneurysm: 4.4 cm on 7/17 echo, 4.4 cm on 5/18 echo.  - MRA chest (4/19): 4.5 cm ascending aortic aneurysm.  - MRA chest (7/20): 4.5 cm ascending aortic aneurysm.  - MRA chest (1/22): 4.3 cm ascending aortic aneurysm (breast abnormality, recommend mammogram => consistent with benign hamartoma) - MRA chest (5/23): 4.3 cm ascending aortic aneurysm - MRA chest (4/24): 4.3 cm ascending aortic aneurysm 7. Prostate cancer: Treated with radioactive seed implantation.  8. Nephrolithiasis.  9. PFTs (4/18): Mild restriction.  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    Comment: retired/[employer]  Tobacco Use   Smoking status: Never   Smokeless tobacco: Never  Vaping Use   Vaping Use: Never used  Substance and Sexual Activity   Alcohol use: Yes    Alcohol/week: 3.0 standard drinks of alcohol    Types: 3 Cans of beer per week    Comment:  "may have 2-3 beers on the weekend"   Drug use: No   Sexual activity: Not on file  Other Topics Concern   Not on file  Social History Narrative   Not on file   Social Determinants of Health   Financial Resource Strain: Not on file  Food Insecurity: Patient Declined (10/16/2022)   Hunger Vital Sign    Worried About Running Out of Food in the Last Year: Patient declined    Ran Out of Food in the Last Year: Patient declined  Transportation Needs: No Transportation Needs  (10/16/2022)   PRAPARE - Administrator, Civil Service (Medical): No    Lack of Transportation (Non-Medical): No  Physical Activity: Not on file  Stress: Not on file  Social Connections: Not on file  Intimate Partner Violence: Not At Risk (10/16/2022)   Humiliation, Afraid, Rape, and Kick questionnaire    Fear of Current or Ex-Partner: No    Emotionally Abused: No    Physically Abused: No    Sexually Abused: No    FH:  Family History  Problem Relation Age of Onset   Heart failure Mother    Diabetes Mother    Prostate cancer Father     Current Outpatient Medications  Medication Sig Dispense Refill   acetaminophen (TYLENOL) 500 MG tablet Take 500 mg by mouth 2 (two) times daily.     allopurinol (ZYLOPRIM) 300 MG tablet Take 150 mg by mouth daily.     amoxicillin (AMOXIL) 500 MG capsule Take 2,000 mg by mouth See admin instructions. Take 2000 mg 1 hour prior to dental work     aspirin EC 81 MG tablet Take 81 mg by mouth daily. Swallow whole.     benzonatate (TESSALON) 200 MG capsule Take 200 mg by mouth 3 (three) times daily as needed for cough.     colchicine 0.6 MG tablet Take 1 tablet (0.6 mg total) by mouth daily as needed (for gout).     docusate sodium (COLACE) 100 MG capsule Take 100 mg by mouth 2 (two) times daily.     ENTRESTO 24-26 MG TAKE 1 TABLET BY MOUTH TWICE A DAY 180 tablet 2   FARXIGA 10 MG TABS tablet TAKE 1 TABLET BY MOUTH EVERY DAY BEFORE BREAKFAST 30 tablet 6   FIBER PO Take 2 capsules by mouth 2 (two) times daily.     folic acid (FOLVITE) 1 MG tablet TAKE 1 TABLET(1 MG) BY MOUTH DAILY 90 tablet 3   metoprolol succinate (TOPROL-XL) 25 MG 24 hr tablet TAKE 1 TAB BY MOUTH AT BEDTIME 30 tablet 3   polyethylene glycol powder (GLYCOLAX/MIRALAX) 17 GM/SCOOP powder Take 17 g by mouth 2 (two) times daily as needed for moderate constipation or mild constipation. 238 g 2   spironolactone (ALDACTONE) 25 MG tablet Take 1 tablet (25 mg total) by mouth daily. 90  tablet 3   tamsulosin (FLOMAX) 0.4 MG CAPS capsule Take 0.4 mg by mouth every other day.     tolterodine (DETROL LA) 4 MG 24 hr capsule Take 4 mg  by mouth every other day. Alternating days with the tamsulosin     warfarin (COUMADIN) 5 MG tablet Take 0.5-1 tablets (2.5-5 mg total) by mouth See admin instructions. 1/19 and 1/20  take 7.5mg  (1 and 1/2 tab) then take 1 tab (5mg ) daily until seen by MD  Take 1 tablet (5 mg) by mouth on Sundays, Mondays, Wednesdays, Thursdays & Saturdays in the evening. Take 0.5 tablet (2.5 mg) by mouth on Tuesdays & Fridays in the evening. (Patient taking differently: Take 2.5-5 mg by mouth See admin instructions. Take 2.5 mg every evening with supper on Monday, Friday. 5 mg with supper all  other days.) 45 tablet 11   torsemide (DEMADEX) 20 MG tablet 40 mg ( 2 Tab) daily, Alternating with 20 mg daily (Patient not taking: Reported on 01/17/2023) 60 tablet 11   No current facility-administered medications for this encounter.   Facility-Administered Medications Ordered in Other Encounters  Medication Dose Route Frequency Provider Last Rate Last Admin   perflutren lipid microspheres (DEFINITY) IV suspension  1-10 mL Intravenous PRN Laurey Morale, MD   2 mL at 01/17/23 1035   BP 96/70   Pulse 88   Wt 91.2 kg (201 lb)   SpO2 94%   BMI 28.03 kg/m   Wt Readings from Last 3 Encounters:  01/17/23 91.2 kg (201 lb)  11/22/22 89.4 kg (197 lb 3.2 oz)  11/06/22 88.9 kg (196 lb)   PHYSICAL EXAM: General:  NAD. No resp difficulty, walked into clinic HEENT: Normal Neck: Supple. JVP 10. Carotids 2+ bilat; no bruits. No lymphadenopathy or thryomegaly appreciated. Cor: PMI nondisplaced. Regular rate & rhythm. No rubs, gallops , + mechanical S2 Lungs: Clear Abdomen: Soft, nontender, nondistended. No hepatosplenomegaly. No bruits or masses. Good bowel sounds. Extremities: No cyanosis, clubbing, rash,1+ BLE edema Neuro: Alert & oriented x 3, cranial nerves grossly intact.  Moves all 4 extremities w/o difficulty. Affect pleasant.  ASSESSMENT & PLAN: 1. Chronic systolic CHF: EF on TEE in 1/18 20-25%, mechanical aortic valve ok. 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  He required milrinone to assist diuresis during 1/18-2/18 admission.  Now that he is back in NSR s/p atrial fibrillation ablation, echo in 5/18 showed EF back up to 45%.  Echo in 5/19 showed mildly lower systolic function, EF 35-40%. Echo in 12/21 showed EF up to 60-65%.  However, echo 1/23 showed EF back down to 35-40% with mild RV dysfunction.  Coronary angiography in 1/23 showed nonobstructive mild CAD.  NYHA class II-III.  He is volume overloaded on exam, ReDs 42%. He has been off torsemide x 1 week.  Low BP today limits uptitration of GDMT.  - Restart torsemide 40 mg daily. BMET and BNP today, repeat BMET in 1 week. - Continue Entresto 24/26 mg bid.      - Continue Toprol XL 25 mg qhs.   - Continue Farxiga 10 mg daily.  - Continue spironolactone 25 mg daily.  - Echo today, official results pending, but EF appears similar to previous. 2. Mechanical aortic valve: Continue warfarin and ASA 81. INR followed by PCP. Valve looked ok last echo. Check INR today and will forward to PCP. - As above, repeat echo today. - He will need antibiotic prophylaxis with dental work.  3. Atrial fibrillation/atrial flutter: Admitted 1/18 with atrial fibrillation with RVR and CHF.  EF down to 20-25%.  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  Need to keep him in NSR. DCCV during 2/18 admission and now s/p atrial fibrillation ablation.  In AFL at 9/23 follow up, DCCV to NSR. He is regular on exam today. - He is now off amiodarone.   - Continue warfarin. Check INR and forward to PCP 4. Ascending aortic aneurysm: 4.3 cm on 4/24 MRA chest.  Followup 6 wks with APP for fluid check.    Anderson [facility] FNP-BC 01/17/2023

## 2023-01-17 ENCOUNTER — Telehealth (HOSPITAL_COMMUNITY): Payer: Self-pay | Admitting: Cardiology

## 2023-01-17 ENCOUNTER — Ambulatory Visit (HOSPITAL_COMMUNITY)
Admission: RE | Admit: 2023-01-17 | Discharge: 2023-01-17 | Disposition: A | Discharge status: Home / Self Care | Payer: Medicare Other | Source: Ambulatory Visit | Attending: Cardiology | Admitting: Cardiology

## 2023-01-17 ENCOUNTER — Encounter (HOSPITAL_COMMUNITY): Payer: Self-pay

## 2023-01-17 ENCOUNTER — Ambulatory Visit (HOSPITAL_BASED_OUTPATIENT_CLINIC_OR_DEPARTMENT_OTHER)
Admission: RE | Admit: 2023-01-17 | Discharge: 2023-01-17 | Disposition: A | Discharge status: Home / Self Care | Payer: Medicare Other | Source: Ambulatory Visit | Attending: Cardiology | Admitting: Cardiology

## 2023-01-17 VITALS — BP 96/70 | HR 88 | Wt 201.0 lb

## 2023-01-17 DIAGNOSIS — Z7901 Long term (current) use of anticoagulants: Secondary | ICD-10-CM | POA: Diagnosis not present

## 2023-01-17 DIAGNOSIS — I480 Paroxysmal atrial fibrillation: Secondary | ICD-10-CM

## 2023-01-17 DIAGNOSIS — Z9889 Other specified postprocedural states: Secondary | ICD-10-CM | POA: Diagnosis not present

## 2023-01-17 DIAGNOSIS — I110 Hypertensive heart disease with heart failure: Secondary | ICD-10-CM | POA: Insufficient documentation

## 2023-01-17 DIAGNOSIS — I509 Heart failure, unspecified: Secondary | ICD-10-CM | POA: Diagnosis not present

## 2023-01-17 DIAGNOSIS — I714 Abdominal aortic aneurysm, without rupture, unspecified: Secondary | ICD-10-CM

## 2023-01-17 DIAGNOSIS — I4891 Unspecified atrial fibrillation: Secondary | ICD-10-CM | POA: Insufficient documentation

## 2023-01-17 DIAGNOSIS — Z952 Presence of prosthetic heart valve: Secondary | ICD-10-CM

## 2023-01-17 DIAGNOSIS — I5022 Chronic systolic (congestive) heart failure: Secondary | ICD-10-CM

## 2023-01-17 LAB — ECHOCARDIOGRAM COMPLETE
AR max vel: 3.17 cm2
AV Area VTI: 3.58 cm2
AV Area mean vel: 3.13 cm2
AV Mean grad: 5 mmHg
AV Peak grad: 9.7 mmHg
Ao pk vel: 1.56 m/s
Area-P 1/2: 3.99 cm2
S' Lateral: 4.6 cm

## 2023-01-17 LAB — PROTIME-INR
INR: 2.4 — ABNORMAL HIGH (ref 0.8–1.2)
Prothrombin Time: 26.1 seconds — ABNORMAL HIGH (ref 11.4–15.2)

## 2023-01-17 LAB — BASIC METABOLIC PANEL
Anion gap: 7 (ref 5–15)
BUN: 17 mg/dL (ref 8–23)
CO2: 27 mmol/L (ref 22–32)
Calcium: 9 mg/dL (ref 8.9–10.3)
Chloride: 103 mmol/L (ref 98–111)
Creatinine, Ser: 1.33 mg/dL — ABNORMAL HIGH (ref 0.61–1.24)
GFR, Estimated: 54 mL/min — ABNORMAL LOW (ref >60)
Glucose, Bld: 104 mg/dL — ABNORMAL HIGH (ref 70–99)
Potassium: 5.8 mmol/L — ABNORMAL HIGH (ref 3.5–5.1)
Sodium: 137 mmol/L (ref 135–145)

## 2023-01-17 LAB — BRAIN NATRIURETIC PEPTIDE: B Natriuretic Peptide: 1213.3 pg/mL — ABNORMAL HIGH (ref 0.0–100.0)

## 2023-01-17 MED ORDER — PERFLUTREN LIPID MICROSPHERE
1.0000 mL | INTRAVENOUS | Status: DC | PRN
  Administered 2023-01-17: 2 mL via INTRAVENOUS

## 2023-01-17 MED ORDER — TORSEMIDE 20 MG PO TABS: 40.0000 mg | ORAL_TABLET | Freq: Every day | ORAL | 11 refills | Status: DC

## 2023-01-17 NOTE — Patient Instructions (Signed)
Reds Clip done today.  Labs done today. We will contact you only if your labs are abnormal.  RESTART Torsemide 40mg  (2 tablets) by mouth daily.  No other medication changes were made. Please continue all current medications as prescribed.  Your physician recommends that you schedule a follow-up appointment in: 1 week for a lab only appointment and in 6 weeks with our NP/PA Clinic here in our office.   If you have any questions or concerns before your next appointment please send [place] a message through [place] or call our office at [phone].    TO LEAVE A MESSAGE FOR THE NURSE SELECT OPTION 2, PLEASE LEAVE A MESSAGE INCLUDING: YOUR NAME DATE OF BIRTH CALL BACK NUMBER REASON FOR CALL**this is important as we prioritize the call backs  YOU WILL RECEIVE A CALL BACK THE SAME DAY AS LONG AS YOU CALL BEFORE 4:00 PM   Do the following things EVERYDAY: Weigh yourself in the morning before breakfast. Write it down and keep it in a log. Take your medicines as prescribed Eat low salt foods--Limit salt (sodium) to 2000 mg per day.  Stay as active as you can everyday Limit all fluids for the day to less than 2 liters   At the [facility], you and your health needs are our priority. As part of our continuing mission to provide you with exceptional heart care, we have created designated Provider Care Teams. These Care Teams include your primary Cardiologist (physician) and Advanced Practice Providers (APPs- Physician Assistants and Nurse Practitioners) who all work together to provide you with the care you need, when you need it.   You may see any of the following providers on your designated Care Team at your next follow up: Dr Arvilla Meres Dr Marca Ancona Dr. Marcos Eke, NP Robbie Lis, [place] [facility] [place], [place] Brynda Peon, NP Karle Plumber, PharmD   Please be sure to bring in all your medications bottles to every appointment.     Thank you for choosing [facility]

## 2023-01-17 NOTE — Progress Notes (Signed)
ReDS Vest / Clip - 01/17/23 1100       ReDS Vest / Clip   Station Marker D    Ruler Value 32    ReDS Value Range High volume overload    ReDS Actual Value 42

## 2023-01-17 NOTE — Telephone Encounter (Signed)
-----   Message from Jacklynn Ganong, [place] sent at 01/17/2023  2:46 PM EDT ----- BNP elevated and K is too high.  Stop spiro. He is not on KCL supplements Take Lokelma 10 g x 1 today.  Repeat BMET on Friday or Monday

## 2023-01-17 NOTE — Telephone Encounter (Signed)
Patient called.  Patient aware.    Medication Samples have been provided to the patient.  Drug name: lokelma       Strength: 10 g        Qty: 2 packets  LOT: [id number]  Exp.Date: 12/21/24  Dosing instructions: one packet as directed  The patient has been instructed regarding the correct time, dose, and frequency of taking this medication, including desired effects and most common side effects.   Theresia Bough 4:25 PM 01/17/2023

## 2023-01-19 ENCOUNTER — Ambulatory Visit (HOSPITAL_COMMUNITY)
Admission: RE | Admit: 2023-01-19 | Discharge: 2023-01-19 | Disposition: A | Discharge status: Home / Self Care | Payer: Medicare Other | Source: Ambulatory Visit | Attending: Cardiology | Admitting: Cardiology

## 2023-01-19 DIAGNOSIS — I5022 Chronic systolic (congestive) heart failure: Secondary | ICD-10-CM | POA: Insufficient documentation

## 2023-01-19 LAB — BASIC METABOLIC PANEL
Anion gap: 10 (ref 5–15)
BUN: 28 mg/dL — ABNORMAL HIGH (ref 8–23)
CO2: 27 mmol/L (ref 22–32)
Calcium: 8.9 mg/dL (ref 8.9–10.3)
Chloride: 97 mmol/L — ABNORMAL LOW (ref 98–111)
Creatinine, Ser: 1.72 mg/dL — ABNORMAL HIGH (ref 0.61–1.24)
GFR, Estimated: 39 mL/min — ABNORMAL LOW (ref >60)
Glucose, Bld: 133 mg/dL — ABNORMAL HIGH (ref 70–99)
Potassium: 4.3 mmol/L (ref 3.5–5.1)
Sodium: 134 mmol/L — ABNORMAL LOW (ref 135–145)

## 2023-01-20 ENCOUNTER — Other Ambulatory Visit (HOSPITAL_COMMUNITY): Payer: Self-pay | Admitting: Cardiology

## 2023-01-22 ENCOUNTER — Telehealth (HOSPITAL_COMMUNITY): Payer: Self-pay

## 2023-01-22 ENCOUNTER — Other Ambulatory Visit (HOSPITAL_COMMUNITY): Payer: Self-pay

## 2023-01-22 MED ORDER — METOPROLOL SUCCINATE ER 25 MG PO TB24: ORAL_TABLET | ORAL | 3 refills | Status: DC

## 2023-01-22 NOTE — Telephone Encounter (Addendum)
Pt aware, agreeable, and verbalized understanding   ----- Message from Laurey Morale, MD sent at 01/21/2023 10:52 AM EDT ----- Would increase hydration with higher creatinine.

## 2023-01-29 ENCOUNTER — Other Ambulatory Visit (HOSPITAL_COMMUNITY): Payer: Medicare Other

## 2023-02-07 ENCOUNTER — Ambulatory Visit (HOSPITAL_COMMUNITY)
Admission: RE | Admit: 2023-02-07 | Discharge: 2023-02-07 | Disposition: A | Discharge status: Home / Self Care | Payer: Medicare Other | Source: Ambulatory Visit | Attending: Cardiology | Admitting: Cardiology

## 2023-02-07 DIAGNOSIS — I5022 Chronic systolic (congestive) heart failure: Secondary | ICD-10-CM | POA: Diagnosis not present

## 2023-02-07 LAB — BASIC METABOLIC PANEL
Anion gap: 6 (ref 5–15)
BUN: 22 mg/dL (ref 8–23)
CO2: 28 mmol/L (ref 22–32)
Calcium: 8.9 mg/dL (ref 8.9–10.3)
Chloride: 103 mmol/L (ref 98–111)
Creatinine, Ser: 1.35 mg/dL — ABNORMAL HIGH (ref 0.61–1.24)
GFR, Estimated: 53 mL/min — ABNORMAL LOW (ref >60)
Glucose, Bld: 124 mg/dL — ABNORMAL HIGH (ref 70–99)
Potassium: 4.6 mmol/L (ref 3.5–5.1)
Sodium: 137 mmol/L (ref 135–145)

## 2023-02-27 NOTE — Progress Notes (Signed)
PCP: [name], MD Cardiology: Dr. [place]  HF Cardiology: Dr. Shirlee Latch   HPI: Mr Schrag is a 82 y.o. male with a history of severe AS s/p St Jude mechanical AVR in 03/2003 on chronic coumadin, HTN, obesity, and mild LV dysfxn  EF 40-45% on 01/2016 echo.  Prior to admit in 1/18, he had a cough and dyspnea that was thought to be bronchitis so he was treated with abx and oral steroids. He was seen in cardiology office in 1/18 and was noted to be in rapid atrial fibrillation with significant volume overload.   Admitted 1/23 through 08/29/16 with marked volume overload, lower extremity edema, and atrial fibrillation with RVR.  Initial cardioversion failed and he was difficult to diurese. He was placed on milrinone for low output and diuresed with IV lasix with good response.  Weight came down considerably. Amiodarone was begun. Once fully diuresed, he had successful DC-CV on 08/28/16. Discharged on torsemide 60 mg daily. Discharge weight 212 pounds. Echo and TEE in 1/18 had shown fall in EF to around 25%.   He continued to lose weight as an outpatient.  He had a syncopal episode where he stood up from sitting and got lightheaded with transient loss of consciousness.  He had been having orthostatic symptoms.  We saw him in the office and cut back on torsemide from 60 mg daily down eventually to 20 mg daily.  Creatinine up some to 1.7 at that time.    At a prior appt, he was back in atrial fibrillation.  He was set up for DCCV in 3/18, but was in NSR when he arrived to short stay. He had atrial fibrillation ablation with Dr. Elberta Fortis in 5/18.   Echo was done in 5/18, EF up to 45% with mild diffuse hypokinesis, normal mechanical aortic valve, mildly decreased RV systolic function.    Echo was repeated in 5/19, EF 35-40%, diffuse hypokinesis, mildly decreased RV systolic function, stable mechanical aortic valve.   Echo in 11/21 showed EF up to 50-55%, normal RV size and systolic function, mechanical aortic  valve looks ok, normal IVC, ascending aorta 4.2 cm. Echo in 12/21 showed EF 60-65%, mild RV enlargement, normal RV function, mechanical aortic valve mean gradient 10 mmHg, ascending aorta 4.1 cm.  MRA chest in 1/22 showed 4.3 cm ascending aorta.   Echo 1/23 showed EF 35-40%, global hypokinesis, mildly decreased RV systolic function, normal mechanical aortic valve, moderate MR, PASP 51 mmHg. With EF back down and mild volume overload, he was started on Farxiga, torsemide increased and R/LHC arranged.  R/LHC (1/23) showed mild elevated PCWP with mild pulmonary venous hypertension, preserved CO and minimal CAD.  Follow up 9/23, found to be in AFL with increased fatige and mild volume overload. Cleda Daub increased to 25 mg daily and started on torsemide 20 mg daily. Arranged for DCCV, s/p DCCV 04/05/22 to NSR.  Admitted 3/24 with CHF and multifocal pneumonia. Diuresed with IV lasix and received IV abx. Transitioned to higher dose of torsemide, 40 mg. He was discharged home, weight 203 lbs.  Follow up 5/24, NYHA II-III symptoms, mildly volume overloaded. Torsemide increased and repeat echo arranged.  Follow up 6/24, he was volume overloaded 2/2 to being off torsemide x 1 week. Torsemide 40 mg daily restarted.   Echo 01/16/23 showed EF 30-35%, RV mildly reduced, mild MR, mechanical AVR stable with mean gradient 5 mmHg  Today he returns for HF follow up. Overall feeling fine. He is not SOB walking on flat  ground, remains SOB walking up steps. Denies palpitations, CP, dizziness, edema, or PND/Orthopnea. Appetite ok. No fever or chills. Weight at home 200 pounds. Taking all medications.   ReDs: 42%-->37% today.  ECG (personally reviewed): NSR with PACs with 1AVB 240 msec, 72 bpm  Labs (3/19): K 4.7,creatinine 0.99 Labs (5/19): K 4.6, creatinine 0.98 Labs (9/19): K 4.2, creatinine 0.91 Labs (12/19): K 4.6, creatinine 1.14 Labs (3/20): K 4.6, creatinine 0.98 Labs (7/20): creatinine 1.07 Labs (9/21): K 5,  creatinine 1.13 Labs (12/21): LDL 77, K 4.1, creatinine 1.61 Labs (6/22): K 4.8, creatinine 1.02 Labs (10/22): K 4.9, creatinine 1.02 Labs (11/22): K 4.9, creatinine 1.02 Labs (1/23): K 4.5, creatinine 1.18 Labs (2/23): K 4.8, creatinine 1.29 Labs (5/23): LDL 92 Labs (6/23): K 4.7, creatinine 1.16, BNP 521 Labs (9/23): K 4.7, creatinine 1.24 Labs (3/24): K 4.3, creatinine 1.28 Labs (4/24): K 5.2, creatinine 1.38, BNP 840 Labs (5/24): K 4.7, creatinine 1.44  PMH: 1. Aortic stenosis: # 25 St Jude mechanical aortic valve in 9/14.   2. Chronic systolic CHF: Echo in 7/17 with EF 45-50%.  Admitted in 1/18 with afib with RVR, ?tachycardia-mediated cardiomyopathy.  - Echo (1/18) with EF 25-30%, stable mechanical aortic valve.  - TEE (1/18) with EF 20-25%, stable mechanical aortic valve, moderate MR, small PFO.  - Admission 1/18 with acute on chronic systolic CHF, required milrinone, extensively diuresed.  - Echo (5/18) with EF 45%, diffuse hypokinesis, normal RV size with mildly decreased systolic function, mechanical aortic valve with mean gradient 14 mmHg, ascending aorta 4.4 cm.  - Echo (5/19) with EF 35-40%, diffuse hypokinesis, mild LVH, mildly decreased RV systolic function, mechanical aortic valve looked ok.   - Echo (11/20) with EF up to 50-55%, normal RV size and systolic function, mechanical aortic valve looks ok, normal IVC, ascending aorta 4.2 cm.  - Echo (12/21) with EF 60-65%, mild RV enlargement, normal RV function, mechanical aortic valve mean gradient 10 mmHg, ascending aorta 4.1 cm. - Echo (1/23) with EF 35-40%, global hypokinesis, mildly decreased RV systolic function, normal mechanical aortic valve, moderate MR, PASP 51 mmHg.  - L/RHC (1/23): minimal CAD, RA mean 6, PA 35/18 (25) PCWP mean 17, preserved CO/CI 4.87/2.39, PVR 1.6 WU. - Echo (6/24): EF 30-35%, RV mildly reduced, mild MR, mechanical AVR stable with mean gradient 5 mmHg 3. Atrial fibrillation: First noted in 1/18,  uncertain how long it had been present (with RVR).  08/28/2016 Successful DC-CV --> NSR.  Back in atrial fibrillation at 09/18/16 office visit.  Set up for DCCV in 3/18 but back in NSR.   - Atrial fibrillation ablation in 5/18. Now off amiodarone.  - s/p DCCV for AFL--> NSR (9/23) 4. 09/04/2016 Syncope--> over-diuresis most likely.  5. HTN 6. Ascending aortic aneurysm: 4.4 cm on 7/17 echo, 4.4 cm on 5/18 echo.  - MRA chest (4/19): 4.5 cm ascending aortic aneurysm.  - MRA chest (7/20): 4.5 cm ascending aortic aneurysm.  - MRA chest (1/22): 4.3 cm ascending aortic aneurysm (breast abnormality, recommend mammogram => consistent with benign hamartoma) - MRA chest (5/23): 4.3 cm ascending aortic aneurysm - MRA chest (4/24): 4.3 cm ascending aortic aneurysm 7. Prostate cancer: Treated with radioactive seed implantation.  8. Nephrolithiasis.  9. PFTs (4/18): Mild restriction.  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    Comment: retired/[employer]  Tobacco Use   Smoking status: Never   Smokeless tobacco: Never  Vaping Use   Vaping status: Never Used  Substance and Sexual Activity   Alcohol use: Yes    Alcohol/week: 3.0 standard drinks of alcohol    Types: 3 Cans of beer per week    Comment:  "may have 2-3 beers on the weekend"   Drug use: No   Sexual activity: Not on file  Other Topics Concern   Not on file  Social History Narrative   Not on file   Social Determinants of Health   Financial Resource Strain: Not on file  Food Insecurity: Patient Declined (10/16/2022)   Hunger Vital Sign    Worried About Running Out of Food in the Last Year: Patient declined    Ran Out of Food in the Last Year:  Patient declined  Transportation Needs: No Transportation Needs (10/16/2022)   PRAPARE - Administrator, Civil Service (Medical): No    Lack of Transportation (Non-Medical): No  Physical Activity: Not on file  Stress: Not on file  Social Connections: Not on file  Intimate Partner Violence: Not At Risk (10/16/2022)   Humiliation, Afraid, Rape, and Kick questionnaire    Fear of Current or Ex-Partner: No    Emotionally Abused: No    Physically Abused: No    Sexually Abused: No    FH:  Family History  Problem Relation Age of Onset   Heart failure Mother    Diabetes Mother    Prostate cancer Father     Current Outpatient Medications  Medication Sig Dispense Refill   acetaminophen (TYLENOL) 500 MG tablet Take 500 mg by mouth 2 (two) times daily.     allopurinol (ZYLOPRIM) 300 MG tablet Take 150 mg by mouth daily.     amoxicillin (AMOXIL) 500 MG capsule Take 2,000 mg by mouth See admin instructions. Take 2000 mg 1 hour prior to dental work     aspirin EC 81 MG tablet Take 81 mg by mouth daily. Swallow whole.     benzonatate (TESSALON) 200 MG capsule Take 200 mg by mouth 3 (three) times daily as needed for cough.     colchicine 0.6 MG tablet Take 1 tablet (0.6 mg total) by mouth daily as needed (for gout).     docusate sodium (COLACE) 100 MG capsule Take 100 mg by mouth 2 (two) times daily.     ENTRESTO 24-26 MG TAKE 1 TABLET BY MOUTH TWICE A DAY 180 tablet 2   FARXIGA 10 MG TABS tablet TAKE 1 TABLET BY MOUTH EVERY DAY BEFORE BREAKFAST 30 tablet 6   FIBER PO Take 2 capsules by mouth 2 (two) times daily.     folic acid (FOLVITE) 1 MG tablet TAKE 1 TABLET(1 MG) BY MOUTH DAILY 90 tablet 3   metoprolol succinate (TOPROL-XL) 25 MG 24 hr tablet TAKE 1 TABLET BY MOUTH EVERYDAY AT BEDTIME 30 tablet 3   polyethylene glycol powder (GLYCOLAX/MIRALAX) 17 GM/SCOOP powder Take 17 g by mouth 2 (two) times daily as needed for moderate constipation or mild constipation. 238 g 2   tamsulosin  (FLOMAX) 0.4 MG CAPS capsule Take 0.4 mg by mouth every other day.     tolterodine (DETROL LA) 4 MG 24 hr capsule Take 4 mg by mouth every other  day. Alternating days with the tamsulosin     torsemide (DEMADEX) 20 MG tablet Take 2 tablets (40 mg total) by mouth daily. 60 tablet 11   warfarin (COUMADIN) 5 MG tablet Take 0.5-1 tablets (2.5-5 mg total) by mouth See admin instructions. 1/19 and 1/20  take 7.5mg  (1 and 1/2 tab) then take 1 tab (5mg ) daily until seen by MD  Take 1 tablet (5 mg) by mouth on Sundays, Mondays, Wednesdays, Thursdays & Saturdays in the evening. Take 0.5 tablet (2.5 mg) by mouth on Tuesdays & Fridays in the evening. (Patient taking differently: Take 2.5-5 mg by mouth See admin instructions. Take 2.5 mg every evening with supper on Monday, Friday. 5 mg with supper all  other days.) 45 tablet 11   No current facility-administered medications for this encounter.   BP 96/66   Pulse 84   Wt 90 kg (198 lb 6.4 oz)   SpO2 97%   BMI 27.67 kg/m   Wt Readings from Last 3 Encounters:  02/28/23 90 kg (198 lb 6.4 oz)  01/17/23 91.2 kg (201 lb)  11/22/22 89.4 kg (197 lb 3.2 oz)   PHYSICAL EXAM: General:  NAD. No resp difficulty, walked into clinic HEENT: Normal Neck: Supple. No JVD, thick neck. Carotids 2+ bilat; no bruits. No lymphadenopathy or thryomegaly appreciated. Cor: PMI nondisplaced. Regular rate & rhythm. No rubs, gallops or murmurs. + mechanical S2 Lungs: Clear Abdomen: Soft, obese, nontender, nondistended. No hepatosplenomegaly. No bruits or masses. Good bowel sounds. Extremities: No cyanosis, clubbing, rash, 1+ BLE edema Neuro: Alert & oriented x 3, cranial nerves grossly intact. Moves all 4 extremities w/o difficulty. Affect pleasant..  ASSESSMENT & PLAN: 1. Chronic systolic CHF: EF on TEE in 1/18 20-25%, mechanical aortic valve ok. 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  He required  milrinone to assist diuresis during 1/18-2/18 admission.  Now that he is back in NSR s/p atrial fibrillation ablation, echo in 5/18 showed EF back up to 45%.  Echo in 5/19 showed mildly lower systolic function, EF 35-40%. Echo in 12/21 showed EF up to 60-65%.  However, echo 1/23 showed EF back down to 35-40% with mild RV dysfunction.  Coronary angiography in 1/23 showed nonobstructive mild CAD. Echo 6/24 showed EF 30-35%, RV mildly reduced. Today, he has improved NYHA class II-IIb.  Volume improved today but remains mildly volume overloaded. ReDs 37%. Low BP today limits uptitration of GDMT.  - Increase torsemide to 60 mg daily alternating with 40 mg every other day. BMET and BNP today, repeat BMET in 1 week. - Continue Entresto 24/26 mg bid.      - Continue Toprol XL 25 mg qhs.   - Continue Farxiga 10 mg daily.  - Continue spironolactone 25 mg daily.  - EF remains mildly lower. Likely not ICD candidate with advanced age. 2. Mechanical aortic valve: Continue warfarin and ASA 81. INR followed by PCP. Valve looked ok most recent echo.  - He will need antibiotic prophylaxis with dental work.  3. Atrial fibrillation/atrial flutter: Admitted 1/18 with atrial fibrillation with RVR and CHF.  EF down to 20-25%.  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  Need to keep him in NSR. DCCV during 2/18 admission and now s/p atrial fibrillation ablation.  In AFL at 9/23 follow up, DCCV to NSR. NSR on ECG today. - He is now off amiodarone.   -  Continue warfarin.  4. Ascending aortic aneurysm: 4.3 cm on 4/24 MRA chest.  Follow up in 3 months with Dr. Kathreen Cornfield [facility] FNP-BC 02/28/2023

## 2023-02-28 ENCOUNTER — Telehealth (HOSPITAL_COMMUNITY): Payer: Self-pay

## 2023-02-28 ENCOUNTER — Ambulatory Visit (HOSPITAL_COMMUNITY)
Admission: RE | Admit: 2023-02-28 | Discharge: 2023-02-28 | Disposition: A | Discharge status: Home / Self Care | Payer: Medicare Other | Source: Ambulatory Visit | Attending: Family Medicine | Admitting: Family Medicine

## 2023-02-28 ENCOUNTER — Encounter (HOSPITAL_COMMUNITY): Payer: Self-pay

## 2023-02-28 ENCOUNTER — Telehealth (HOSPITAL_COMMUNITY): Payer: Self-pay | Admitting: Vascular Surgery

## 2023-02-28 VITALS — BP 96/66 | HR 84 | Wt 198.4 lb

## 2023-02-28 DIAGNOSIS — I7121 Aneurysm of the ascending aorta, without rupture: Secondary | ICD-10-CM | POA: Diagnosis not present

## 2023-02-28 DIAGNOSIS — I4891 Unspecified atrial fibrillation: Secondary | ICD-10-CM | POA: Diagnosis not present

## 2023-02-28 DIAGNOSIS — I480 Paroxysmal atrial fibrillation: Secondary | ICD-10-CM

## 2023-02-28 DIAGNOSIS — I714 Abdominal aortic aneurysm, without rupture, unspecified: Secondary | ICD-10-CM

## 2023-02-28 DIAGNOSIS — I110 Hypertensive heart disease with heart failure: Secondary | ICD-10-CM | POA: Diagnosis not present

## 2023-02-28 DIAGNOSIS — Z7982 Long term (current) use of aspirin: Secondary | ICD-10-CM | POA: Diagnosis not present

## 2023-02-28 DIAGNOSIS — Z7901 Long term (current) use of anticoagulants: Secondary | ICD-10-CM | POA: Diagnosis not present

## 2023-02-28 DIAGNOSIS — I5022 Chronic systolic (congestive) heart failure: Secondary | ICD-10-CM | POA: Diagnosis not present

## 2023-02-28 DIAGNOSIS — Z952 Presence of prosthetic heart valve: Secondary | ICD-10-CM | POA: Insufficient documentation

## 2023-02-28 LAB — BASIC METABOLIC PANEL
Anion gap: 16 — ABNORMAL HIGH (ref 5–15)
BUN: 23 mg/dL (ref 8–23)
CO2: 26 mmol/L (ref 22–32)
Calcium: 9.1 mg/dL (ref 8.9–10.3)
Chloride: 97 mmol/L — ABNORMAL LOW (ref 98–111)
Creatinine, Ser: 1.24 mg/dL (ref 0.61–1.24)
GFR, Estimated: 58 mL/min — ABNORMAL LOW (ref >60)
Glucose, Bld: 100 mg/dL — ABNORMAL HIGH (ref 70–99)
Potassium: 3.8 mmol/L (ref 3.5–5.1)
Sodium: 139 mmol/L (ref 135–145)

## 2023-02-28 LAB — BRAIN NATRIURETIC PEPTIDE: B Natriuretic Peptide: 899.6 pg/mL — ABNORMAL HIGH (ref 0.0–100.0)

## 2023-02-28 MED ORDER — TORSEMIDE 20 MG PO TABS: 60.0000 mg | ORAL_TABLET | ORAL | 11 refills | Status: DC

## 2023-02-28 MED ORDER — POTASSIUM CHLORIDE CRYS ER 20 MEQ PO TBCR: 20.0000 meq | EXTENDED_RELEASE_TABLET | Freq: Every day | ORAL | 3 refills | Status: DC

## 2023-02-28 NOTE — Telephone Encounter (Addendum)
Pt aware, agreeable, and verbalized understanding   ----- Message from Jacklynn Ganong sent at 02/28/2023  1:43 PM EDT ----- Labs stable.  With increase in diuretic today, please start 20 KCL daily. He has repeat labs arranged

## 2023-02-28 NOTE — Telephone Encounter (Signed)
Encounter opened in error

## 2023-02-28 NOTE — Patient Instructions (Addendum)
Thank you for coming in today  EKG was done today  If you had labs drawn today, any labs that are abnormal the clinic will call you No news is good news  Medications: Change Torsemide to 60 mg every other day alternating with 40 mg every other day  Follow up appointments:  Your physician recommends that you schedule a follow-up appointment in:  3 months With Dr. Earlean Shawl will receive a reminder letter in the mail a few months in advance. If you don't receive a letter, please call our office to schedule the follow-up appointment.    Do the following things EVERYDAY: Weigh yourself in the morning before breakfast. Write it down and keep it in a log. Take your medicines as prescribed Eat low salt foods--Limit salt (sodium) to 2000 mg per day.  Stay as active as you can everyday Limit all fluids for the day to less than 2 liters   At the [facility], you and your health needs are our priority. As part of our continuing mission to provide you with exceptional heart care, we have created designated Provider Care Teams. These Care Teams include your primary Cardiologist (physician) and Advanced Practice Providers (APPs- Physician Assistants and Nurse Practitioners) who all work together to provide you with the care you need, when you need it.   You may see any of the following providers on your designated Care Team at your next follow up: Dr Arvilla Meres Dr Marca Ancona Dr. Marcos Eke, NP Robbie Lis, [place] [facility] [place], [place] Brynda Peon, NP Karle Plumber, PharmD   Please be sure to bring in all your medications bottles to every appointment.    Thank you for choosing [facility]  If you have any questions or concerns before your next appointment please send [place] a message through [place] or call our office at [phone].    TO LEAVE A MESSAGE FOR THE NURSE SELECT OPTION 2, PLEASE  LEAVE A MESSAGE INCLUDING: YOUR NAME DATE OF BIRTH CALL BACK NUMBER REASON FOR CALL**this is important as we prioritize the call backs  YOU WILL RECEIVE A CALL BACK THE SAME DAY AS LONG AS YOU CALL BEFORE 4:00 PM

## 2023-02-28 NOTE — Progress Notes (Signed)
ReDS Vest / Clip - 02/28/23 1100       ReDS Vest / Clip   Station Marker D    Ruler Value 34    ReDS Value Range Moderate volume overload    ReDS Actual Value 37

## 2023-03-07 DIAGNOSIS — D485 Neoplasm of uncertain behavior of skin: Secondary | ICD-10-CM | POA: Diagnosis not present

## 2023-03-07 DIAGNOSIS — L821 Other seborrheic keratosis: Secondary | ICD-10-CM | POA: Diagnosis not present

## 2023-03-07 DIAGNOSIS — Z85828 Personal history of other malignant neoplasm of skin: Secondary | ICD-10-CM | POA: Diagnosis not present

## 2023-03-07 DIAGNOSIS — L814 Other melanin hyperpigmentation: Secondary | ICD-10-CM | POA: Diagnosis not present

## 2023-03-07 DIAGNOSIS — Z85820 Personal history of malignant melanoma of skin: Secondary | ICD-10-CM | POA: Diagnosis not present

## 2023-03-07 DIAGNOSIS — C4441 Basal cell carcinoma of skin of scalp and neck: Secondary | ICD-10-CM | POA: Diagnosis not present

## 2023-03-07 DIAGNOSIS — L570 Actinic keratosis: Secondary | ICD-10-CM | POA: Diagnosis not present

## 2023-03-12 ENCOUNTER — Ambulatory Visit (HOSPITAL_COMMUNITY)
Admission: RE | Admit: 2023-03-12 | Discharge: 2023-03-12 | Disposition: A | Discharge status: Home / Self Care | Payer: Medicare Other | Source: Ambulatory Visit | Attending: Internal Medicine | Admitting: Internal Medicine

## 2023-03-12 DIAGNOSIS — I5022 Chronic systolic (congestive) heart failure: Secondary | ICD-10-CM | POA: Diagnosis not present

## 2023-03-12 LAB — BASIC METABOLIC PANEL
Anion gap: 11 (ref 5–15)
BUN: 29 mg/dL — ABNORMAL HIGH (ref 8–23)
CO2: 27 mmol/L (ref 22–32)
Calcium: 8.7 mg/dL — ABNORMAL LOW (ref 8.9–10.3)
Chloride: 98 mmol/L (ref 98–111)
Creatinine, Ser: 1.38 mg/dL — ABNORMAL HIGH (ref 0.61–1.24)
GFR, Estimated: 51 mL/min — ABNORMAL LOW (ref >60)
Glucose, Bld: 119 mg/dL — ABNORMAL HIGH (ref 70–99)
Potassium: 4 mmol/L (ref 3.5–5.1)
Sodium: 136 mmol/L (ref 135–145)

## 2023-03-19 DIAGNOSIS — U071 COVID-19: Secondary | ICD-10-CM | POA: Diagnosis not present

## 2023-04-02 ENCOUNTER — Telehealth (HOSPITAL_COMMUNITY): Payer: Self-pay | Admitting: Cardiology

## 2023-04-02 MED ORDER — TORSEMIDE 20 MG PO TABS: ORAL_TABLET | ORAL | 11 refills | Status: DC

## 2023-04-02 NOTE — Telephone Encounter (Signed)
Pt aware.

## 2023-04-02 NOTE — Telephone Encounter (Signed)
Patient called to request decrease or discontinuation of torsemide.  Reports increase in UOP "its just too much for me" Weight for the past three days-190.9,191,191.4 Denies SOB or swelling   Was previously taking 40 daily

## 2023-05-07 DIAGNOSIS — Z7901 Long term (current) use of anticoagulants: Secondary | ICD-10-CM | POA: Diagnosis not present

## 2023-05-11 ENCOUNTER — Other Ambulatory Visit (HOSPITAL_COMMUNITY): Payer: Self-pay | Admitting: Cardiology

## 2023-05-15 ENCOUNTER — Other Ambulatory Visit (HOSPITAL_COMMUNITY): Payer: Self-pay | Admitting: Cardiology

## 2023-05-15 MED ORDER — DAPAGLIFLOZIN PROPANEDIOL 10 MG PO TABS: 10.0000 mg | ORAL_TABLET | Freq: Every day | ORAL | 6 refills | Status: DC

## 2023-06-05 DIAGNOSIS — I10 Essential (primary) hypertension: Secondary | ICD-10-CM | POA: Diagnosis not present

## 2023-06-05 DIAGNOSIS — E782 Mixed hyperlipidemia: Secondary | ICD-10-CM | POA: Diagnosis not present

## 2023-06-05 DIAGNOSIS — N1831 Chronic kidney disease, stage 3a: Secondary | ICD-10-CM | POA: Diagnosis not present

## 2023-06-05 DIAGNOSIS — Z7901 Long term (current) use of anticoagulants: Secondary | ICD-10-CM | POA: Diagnosis not present

## 2023-06-05 DIAGNOSIS — Z8739 Personal history of other diseases of the musculoskeletal system and connective tissue: Secondary | ICD-10-CM | POA: Diagnosis not present

## 2023-06-05 DIAGNOSIS — Z0000 Encounter for general adult medical examination without abnormal findings: Secondary | ICD-10-CM | POA: Diagnosis not present

## 2023-06-05 DIAGNOSIS — R7301 Impaired fasting glucose: Secondary | ICD-10-CM | POA: Diagnosis not present

## 2023-06-05 DIAGNOSIS — R718 Other abnormality of red blood cells: Secondary | ICD-10-CM | POA: Diagnosis not present

## 2023-06-05 DIAGNOSIS — D6869 Other thrombophilia: Secondary | ICD-10-CM | POA: Diagnosis not present

## 2023-07-05 ENCOUNTER — Ambulatory Visit (INDEPENDENT_AMBULATORY_CARE_PROVIDER_SITE_OTHER): Payer: Medicare Other | Admitting: Urology

## 2023-07-05 ENCOUNTER — Encounter: Payer: Self-pay | Admitting: Urology

## 2023-07-05 VITALS — BP 99/66 | HR 87 | Ht 70.0 in | Wt 192.0 lb

## 2023-07-05 DIAGNOSIS — N138 Other obstructive and reflux uropathy: Secondary | ICD-10-CM

## 2023-07-05 DIAGNOSIS — C61 Malignant neoplasm of prostate: Secondary | ICD-10-CM

## 2023-07-05 DIAGNOSIS — N401 Enlarged prostate with lower urinary tract symptoms: Secondary | ICD-10-CM

## 2023-07-05 MED ORDER — TAMSULOSIN HCL 0.4 MG PO CAPS
0.4000 mg | ORAL_CAPSULE | ORAL | 3 refills | Status: DC
Start: 2023-07-05 — End: 2024-01-28

## 2023-07-05 MED ORDER — TOLTERODINE TARTRATE ER 4 MG PO CP24: 4.0000 mg | ORAL_CAPSULE | ORAL | 3 refills | Status: DC

## 2023-07-05 NOTE — Progress Notes (Signed)
Assessment: 1. Prostate cancer [facility]); s/p LDR brachytherapy 6/14   2. BPH with obstruction/lower urinary tract symptoms     Plan: Continue tamsulosin and tolterodine Return to office in 1 year with PSA  Chief Complaint:  Chief Complaint  Patient presents with   Prostate Cancer    History of Present Illness:  Adrian Lambert is a 82 y.o. male who is seen for continued evaluation of prostate cancer and overactive bladder. He was originally diagnosed with prostate cancer in March 2014.  Prostate volume measured 49 cc.  3/12 biopsies were positive for adenocarcinoma with Gleason 3+3 and Gleason 3+4 adenocarcinoma.  He underwent I-125 brachytherapy on 01/09/2013.  He has had an excellent PSA response.  His most recent PSA from 05/26/2022 was 0.056.  He has a history of lower urinary tract symptoms.  He has been managed with tamsulosin and tolterodine. At the time of his last visit with Dr. Retta Diones on 05/26/2022, he was found to have 3-10 RBCs on microscopic urinalysis.  At his visit in 12/23, he continued on tamsulosin and tolterodine.  His urinary symptoms were stable with urgency and nocturia x 2.  No dysuria or gross hematuria. IPSS = 4. U/A from 12/23:  0-2 RBCs.  He returns today for annual follow-up.  He is doing very well from a urinary standpoint.  His urinary symptoms are stable.  He continues on tamsulosin and tolterodine.  He is not having any dysuria or gross hematuria.  No weight loss or bone pain. IPSS = 4.  Portions of the above documentation were copied from a prior visit for review purposes only.   Past Medical History:  Past Medical History:  Diagnosis Date   Anticoagulant long-term use    Arthritis    "probably in my thumbs" (06/26/2012)   Bifascicular block    Chronic combined systolic and diastolic CHF (congestive heart failure) (HCC)    a. 01/2016 Echo: EF 45-50%, gr2 DD, inflat AK (poor acoustic windows);  b. 07/2016 Echo: EF 25-30%, mild LVH, PASP  (pt in Afib).   Complication of anesthesia    "wake up w/a start; hallucinations" (06/26/2012)   Critical Aortic Stenosis    a. 03/2003 s/p SJM #25 mech prosthetic AoV;  b. 07/2016 Echo: EF 25-30% (in setting of AF), AoV area 1.72 cm^2 (VTI), 1.75 cm^2 (Vmean).   Diverticulosis    Gouty arthritis    "have had it in both feet, ankles, right knee" (06/26/2012)   History of diverticulitis of colon    History of kidney stones    History of pancreatitis    Hypertension    Incomplete RBBB    PAF (paroxysmal atrial fibrillation) (HCC)    a. CHA2DS2VASc = 4-->chronic coumadin in setting of mech AoV;  b. 07/2016 Recurrent AF RVR.    Past Surgical History:  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  DR GERHARDT   #8mm ST JUDE MECHNICAL PROSTHESIS   ATRIAL FIBRILLATION ABLATION N/A 11/28/2016   Procedure: Atrial Fibrillation Ablation;  Surgeon: Regan Lemming, MD;  Location: [facility] INVASIVE CV LAB;  Service: Cardiovascular;  Laterality: N/A;   CARDIOVERSION N/A 08/18/2016   Procedure: CARDIOVERSION;  Surgeon: Vesta Mixer, MD;  Location: [facility] ENDOSCOPY;  Service: Cardiovascular;  Laterality: N/A;   CARDIOVERSION N/A 08/28/2016   Procedure: CARDIOVERSION;  Surgeon: Laurey Morale, MD;  Location: [facility] ENDOSCOPY;  Service: Cardiovascular;  Laterality: N/A;   CARDIOVERSION N/A 04/05/2022   Procedure: CARDIOVERSION;  Surgeon: Laurey Morale, MD;  Location: [facility] ENDOSCOPY;  Service: Cardiovascular;  Laterality: N/A;   CATARACT EXTRACTION W/ INTRAOCULAR LENS  IMPLANT, BILATERAL  RIGHT 2010/  LEFT DEC 2013   COLONOSCOPY WITH PROPOFOL N/A 02/22/2022   Procedure: COLONOSCOPY WITH PROPOFOL;  Surgeon: Vida Rigger, MD;  Location: [facility] ENDOSCOPY;  Service: Gastroenterology;  Laterality: N/A;   HEMICOLECTOMY  10/06/2003   Laparoscopic-assisted left hemicolectomy for diverticulitis   LAPAROSCOPIC CHOLECYSTECTOMY  07-23-2006   POLYPECTOMY  02/22/2022   Procedure:  POLYPECTOMY;  Surgeon: Vida Rigger, MD;  Location: [facility] ENDOSCOPY;  Service: Gastroenterology;;   PROSTATE BIOPSY  10/09/2012   gleason 3+4=7   RADIOACTIVE SEED IMPLANT N/A 01/09/2013   Procedure: RADIOACTIVE SEED IMPLANT;  Surgeon: Marcine Matar, MD;  Location: [facility];  Service: Urology;  Laterality: N/A;   RIGHT/LEFT HEART CATH AND CORONARY ANGIOGRAPHY N/A 08/11/2021   Procedure: RIGHT/LEFT HEART CATH AND CORONARY ANGIOGRAPHY;  Surgeon: Laurey Morale, MD;  Location: [facility] INVASIVE CV LAB;  Service: Cardiovascular;  Laterality: N/A;   TEE WITHOUT CARDIOVERSION N/A 08/18/2016   Procedure: TRANSESOPHAGEAL ECHOCARDIOGRAM (TEE);  Surgeon: Vesta Mixer, MD;  Location: [facility] ENDOSCOPY;  Service: Cardiovascular;  Laterality: N/A;   TRANSTHORACIC ECHOCARDIOGRAM  04-28-2008   MILD LVH / NORMAL LSF/ NORMAL AORTIC VALVE MECHANICAL PROSTHESIS FUNCTION/ MILD LAE/ EF 55-60%    Allergies:  Allergies  Allergen Reactions   Celecoxib Rash    Other Reaction(s): Unknown    Family History:  Family History  Problem Relation Age of Onset   Heart failure Mother    Diabetes Mother    Prostate cancer Father     Social History:  Social History   Tobacco Use   Smoking status: Never   Smokeless tobacco: Never  Vaping Use   Vaping status: Never Used  Substance Use Topics   Alcohol use: Yes    Alcohol/week: 3.0 standard drinks of alcohol    Types: 3 Cans of beer per week    Comment:  "may have 2-3 beers on the weekend"   Drug use: No    ROS: Constitutional:  Negative for fever, chills, weight loss CV: Negative for chest pain, previous MI, hypertension Respiratory:  Negative for shortness of breath, wheezing, sleep apnea, frequent cough GI:  Negative for nausea, vomiting, bloody stool, GERD  Physical exam: BP 99/66   Pulse 87   Ht 5\' 10"  (1.778 m)   Wt 192 lb (87.1 kg)   BMI 27.55 kg/m  GENERAL APPEARANCE:  Well appearing, well developed, well nourished, NAD HEENT:   Atraumatic, normocephalic, oropharynx clear NECK:  Supple without lymphadenopathy or thyromegaly ABDOMEN:  Soft, non-tender, no masses EXTREMITIES:  Moves all extremities well, without clubbing, cyanosis, or edema NEUROLOGIC:  Alert and oriented x 3, normal gait, CN II-XII grossly intact MENTAL STATUS:  appropriate BACK:  Non-tender to palpation, No CVAT SKIN:  Warm, dry, and intact  Results: U/A: negative

## 2023-07-06 ENCOUNTER — Encounter: Payer: Self-pay | Admitting: Urology

## 2023-07-06 LAB — URINALYSIS, ROUTINE W REFLEX MICROSCOPIC
Bilirubin, UA: NEGATIVE
Ketones, UA: NEGATIVE
Leukocytes,UA: NEGATIVE
Nitrite, UA: NEGATIVE
Protein,UA: NEGATIVE
RBC, UA: NEGATIVE
Specific Gravity, UA: 1.01 (ref 1.005–1.030)
Urobilinogen, Ur: 0.2 mg/dL (ref 0.2–1.0)
pH, UA: 5.5 (ref 5.0–7.5)

## 2023-07-06 LAB — PSA: Prostate Specific Ag, Serum: 0.2 ng/mL (ref 0.0–4.0)

## 2023-07-11 ENCOUNTER — Ambulatory Visit: Payer: Medicare Other | Admitting: Urology

## 2023-07-16 ENCOUNTER — Other Ambulatory Visit (HOSPITAL_COMMUNITY): Payer: Self-pay | Admitting: Cardiology

## 2023-07-23 ENCOUNTER — Other Ambulatory Visit (HOSPITAL_COMMUNITY): Payer: Self-pay | Admitting: Cardiology

## 2023-07-26 DIAGNOSIS — R58 Hemorrhage, not elsewhere classified: Secondary | ICD-10-CM | POA: Diagnosis not present

## 2023-07-26 DIAGNOSIS — L570 Actinic keratosis: Secondary | ICD-10-CM | POA: Diagnosis not present

## 2023-07-26 DIAGNOSIS — I872 Venous insufficiency (chronic) (peripheral): Secondary | ICD-10-CM | POA: Diagnosis not present

## 2023-08-07 DIAGNOSIS — N1831 Chronic kidney disease, stage 3a: Secondary | ICD-10-CM | POA: Diagnosis not present

## 2023-08-07 DIAGNOSIS — Z7901 Long term (current) use of anticoagulants: Secondary | ICD-10-CM | POA: Diagnosis not present

## 2023-08-07 DIAGNOSIS — I872 Venous insufficiency (chronic) (peripheral): Secondary | ICD-10-CM | POA: Diagnosis not present

## 2023-08-22 ENCOUNTER — Telehealth (HOSPITAL_COMMUNITY): Payer: Self-pay

## 2023-08-22 DIAGNOSIS — J189 Pneumonia, unspecified organism: Secondary | ICD-10-CM | POA: Diagnosis not present

## 2023-08-22 DIAGNOSIS — Z7901 Long term (current) use of anticoagulants: Secondary | ICD-10-CM | POA: Diagnosis not present

## 2023-08-22 DIAGNOSIS — Z952 Presence of prosthetic heart valve: Secondary | ICD-10-CM | POA: Diagnosis not present

## 2023-08-22 DIAGNOSIS — R0602 Shortness of breath: Secondary | ICD-10-CM | POA: Diagnosis not present

## 2023-08-22 NOTE — Telephone Encounter (Signed)
Patient called having issues breathing and coughing. Advised him to either get an urgent appointment with his PCP or report to the nearest urgent care to get evaluated

## 2023-08-28 DIAGNOSIS — I872 Venous insufficiency (chronic) (peripheral): Secondary | ICD-10-CM | POA: Diagnosis not present

## 2023-08-28 DIAGNOSIS — L309 Dermatitis, unspecified: Secondary | ICD-10-CM | POA: Diagnosis not present

## 2023-08-28 DIAGNOSIS — I878 Other specified disorders of veins: Secondary | ICD-10-CM | POA: Diagnosis not present

## 2023-09-05 DIAGNOSIS — R059 Cough, unspecified: Secondary | ICD-10-CM | POA: Diagnosis not present

## 2023-09-05 DIAGNOSIS — Z7901 Long term (current) use of anticoagulants: Secondary | ICD-10-CM | POA: Diagnosis not present

## 2023-09-06 ENCOUNTER — Other Ambulatory Visit (HOSPITAL_BASED_OUTPATIENT_CLINIC_OR_DEPARTMENT_OTHER): Payer: Self-pay | Admitting: Family Medicine

## 2023-09-06 DIAGNOSIS — Z85820 Personal history of malignant melanoma of skin: Secondary | ICD-10-CM | POA: Diagnosis not present

## 2023-09-06 DIAGNOSIS — L814 Other melanin hyperpigmentation: Secondary | ICD-10-CM | POA: Diagnosis not present

## 2023-09-06 DIAGNOSIS — Z85828 Personal history of other malignant neoplasm of skin: Secondary | ICD-10-CM | POA: Diagnosis not present

## 2023-09-06 DIAGNOSIS — L821 Other seborrheic keratosis: Secondary | ICD-10-CM | POA: Diagnosis not present

## 2023-09-06 DIAGNOSIS — D0462 Carcinoma in situ of skin of left upper limb, including shoulder: Secondary | ICD-10-CM | POA: Diagnosis not present

## 2023-09-06 DIAGNOSIS — L570 Actinic keratosis: Secondary | ICD-10-CM | POA: Diagnosis not present

## 2023-09-06 DIAGNOSIS — R059 Cough, unspecified: Secondary | ICD-10-CM

## 2023-09-06 DIAGNOSIS — D485 Neoplasm of uncertain behavior of skin: Secondary | ICD-10-CM | POA: Diagnosis not present

## 2023-09-06 DIAGNOSIS — D0421 Carcinoma in situ of skin of right ear and external auricular canal: Secondary | ICD-10-CM | POA: Diagnosis not present

## 2023-09-06 DIAGNOSIS — D044 Carcinoma in situ of skin of scalp and neck: Secondary | ICD-10-CM | POA: Diagnosis not present

## 2023-09-07 ENCOUNTER — Ambulatory Visit (HOSPITAL_BASED_OUTPATIENT_CLINIC_OR_DEPARTMENT_OTHER)
Admission: RE | Admit: 2023-09-07 | Discharge: 2023-09-07 | Disposition: A | Discharge status: Home / Self Care | Payer: Medicare Other | Source: Ambulatory Visit | Attending: Family Medicine | Admitting: Family Medicine

## 2023-09-07 DIAGNOSIS — R918 Other nonspecific abnormal finding of lung field: Secondary | ICD-10-CM | POA: Diagnosis not present

## 2023-09-07 DIAGNOSIS — R059 Cough, unspecified: Secondary | ICD-10-CM | POA: Diagnosis not present

## 2023-09-07 DIAGNOSIS — M545 Low back pain, unspecified: Secondary | ICD-10-CM | POA: Diagnosis not present

## 2023-09-07 DIAGNOSIS — R051 Acute cough: Secondary | ICD-10-CM | POA: Diagnosis not present

## 2023-09-07 DIAGNOSIS — I517 Cardiomegaly: Secondary | ICD-10-CM | POA: Diagnosis not present

## 2023-09-13 ENCOUNTER — Telehealth (HOSPITAL_COMMUNITY): Payer: Self-pay | Admitting: Cardiology

## 2023-09-13 NOTE — Telephone Encounter (Signed)
 This could be indicative of interstitial lung disease.  He will need a high resolution CT chest w/o contrast and referral to pulmonology (ideally Dr. Marchelle Gearing).

## 2023-09-13 NOTE — Telephone Encounter (Signed)
 Patient called to request provider to review CXR ordered by PCP 2/14 -aware PCP will refer to pulmonology -appreciates cardiology input

## 2023-09-14 ENCOUNTER — Encounter (HOSPITAL_BASED_OUTPATIENT_CLINIC_OR_DEPARTMENT_OTHER): Payer: Self-pay

## 2023-09-14 NOTE — Telephone Encounter (Signed)
 LMOM

## 2023-09-17 ENCOUNTER — Encounter: Payer: Self-pay | Admitting: Pulmonary Disease

## 2023-09-17 ENCOUNTER — Ambulatory Visit: Payer: Medicare Other | Admitting: Pulmonary Disease

## 2023-09-17 VITALS — BP 107/74 | HR 101 | Ht 70.0 in | Wt 194.4 lb

## 2023-09-17 DIAGNOSIS — R0600 Dyspnea, unspecified: Secondary | ICD-10-CM | POA: Diagnosis not present

## 2023-09-17 DIAGNOSIS — J849 Interstitial pulmonary disease, unspecified: Secondary | ICD-10-CM

## 2023-09-17 LAB — C-REACTIVE PROTEIN: CRP: 4.5 mg/dL (ref 0.5–20.0)

## 2023-09-17 LAB — SEDIMENTATION RATE: Sed Rate: 17 mm/h (ref 0–20)

## 2023-09-17 MED ORDER — PREDNISONE 10 MG PO TABS: ORAL_TABLET | ORAL | 0 refills | Status: AC

## 2023-09-17 NOTE — Progress Notes (Signed)
 Synopsis: Referred in February 2024 for cough  Subjective:   PATIENT ID: Adrian Lambert GENDER: male DOB: January 03, 1941, MRN: [id number]  HPI  Chief Complaint  Patient presents with   Consult    Pt states  he's been having a bad cough for over a month now some days better then others, Ct was done    Adrian Lambert is an 83 year male, never smoker with history of CHF, paroxysmal atrial fibrillation and aortic stenosis s/p mechanical valve replacement who is referred to pulmonary clinic for shortness of breath and cough.   He has been experiencing a persistent cough for about a month, accompanied by shortness of breath, especially during physical exertion such as climbing stairs. The cough is primarily dry with occasional mucus production and disrupts his sleep at night. No sinus congestion, drainage, or heartburn. He denies any recent exposure to dust, chemicals, or asbestos.  He has a history of an artificial aortic valve replacement and atrial fibrillation, for which he is on Coumadin. He denies significant leg swelling, although minor swelling is noted. He has experienced a rash on his feet in the past due to a pain medication, which resolved after discontinuation. No joint pains or muscle aches.  He reports a dry mouth, which he associates with his cough and medication use. No dry eyes. He has no known diabetes. He has taken prednisone in the past, requiring adjustments to his Coumadin dosage. He monitors his weight to manage fluid retention and adjusts his torsemide dosage accordingly.  His wife quit smoking in 2011 after many years of smoking in the home. He was exposed to secondhand smoke from his grandparents during childhood. 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 He was in the [employer]. Denies chemical or dust exposures.  PFTs 2018 showed normal spirometry with moderate diffusion defect.  Past Medical History:  Diagnosis Date   Anticoagulant long-term use    Arthritis     "probably in my thumbs" (06/26/2012)   Bifascicular block    Chronic combined systolic and diastolic CHF (congestive heart failure) (HCC)    a. 01/2016 Echo: EF 45-50%, gr2 DD, inflat AK (poor acoustic windows);  b. 07/2016 Echo: EF 25-30%, mild LVH, PASP (pt in Afib).   Complication of anesthesia    "wake up w/a start; hallucinations" (06/26/2012)   Critical Aortic Stenosis    a. 03/2003 s/p SJM #25 mech prosthetic AoV;  b. 07/2016 Echo: EF 25-30% (in setting of AF), AoV area 1.72 cm^2 (VTI), 1.75 cm^2 (Vmean).   Diverticulosis    Gouty arthritis    "have had it in both feet, ankles, right knee" (06/26/2012)   History of diverticulitis of colon    History of kidney stones    History of pancreatitis    Hypertension    Incomplete RBBB    PAF (paroxysmal atrial fibrillation) (HCC)    a. CHA2DS2VASc = 4-->chronic coumadin in setting of mech AoV;  b. 07/2016 Recurrent AF RVR.     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    Comment: retired/[employer]  Tobacco Use  Smoking status: Never   Smokeless tobacco: Never  Vaping Use   Vaping status: Never Used  Substance and Sexual Activity   Alcohol use: Yes    Alcohol/week: 3.0 standard drinks of alcohol    Types: 3 Cans of beer per week    Comment:  "may have 2-3 beers on the weekend"   Drug use: No   Sexual activity: Not on file  Other Topics Concern   Not on file  Social History Narrative   Not on file   Social Drivers of Health   Financial Resource Strain: Not on file  Food Insecurity: Patient Declined (10/16/2022)   Hunger Vital Sign    Worried About Running Out of Food in the Last Year: Patient declined    Ran  Out of Food in the Last Year: Patient declined  Transportation Needs: No Transportation Needs (10/16/2022)   PRAPARE - Administrator, Civil Service (Medical): No    Lack of Transportation (Non-Medical): No  Physical Activity: Not on file  Stress: Not on file  Social Connections: Not on file  Intimate Partner Violence: Not At Risk (10/16/2022)   Humiliation, Afraid, Rape, and Kick questionnaire    Fear of Current or Ex-Partner: No    Emotionally Abused: No    Physically Abused: No    Sexually Abused: No     Allergies  Allergen Reactions   Celecoxib Rash    Other Reaction(s): Unknown     Outpatient Medications Prior to Visit  Medication Sig Dispense Refill   acetaminophen (TYLENOL) 500 MG tablet Take 500 mg by mouth 2 (two) times daily.     allopurinol (ZYLOPRIM) 300 MG tablet Take 150 mg by mouth daily.     amoxicillin (AMOXIL) 500 MG capsule Take 2,000 mg by mouth See admin instructions. Take 2000 mg 1 hour prior to dental work     aspirin EC 81 MG tablet Take 81 mg by mouth daily. Swallow whole.     benzonatate (TESSALON) 200 MG capsule Take 200 mg by mouth 3 (three) times daily as needed for cough.     colchicine 0.6 MG tablet Take 1 tablet (0.6 mg total) by mouth daily as needed (for gout).     dapagliflozin propanediol (FARXIGA) 10 MG TABS tablet Take 1 tablet (10 mg total) by mouth daily. 30 tablet 6   docusate sodium (COLACE) 100 MG capsule Take 100 mg by mouth 2 (two) times daily.     ENTRESTO 24-26 MG TAKE 1 TABLET BY MOUTH TWICE A DAY 180 tablet 2   FIBER PO Take 2 capsules by mouth 2 (two) times daily.     folic acid (FOLVITE) 1 MG tablet TAKE 1 TABLET(1 MG) BY MOUTH DAILY 90 tablet 3   metoprolol succinate (TOPROL-XL) 25 MG 24 hr tablet TAKE 1 TABLET BY MOUTH EVERYDAY AT BEDTIME 90 tablet 1   polyethylene glycol powder (GLYCOLAX/MIRALAX) 17 GM/SCOOP powder Take 17 g by mouth 2 (two) times daily as needed for moderate constipation or mild constipation. 238 g 2    potassium chloride SA (KLOR-CON M) 20 MEQ tablet Take 1 tablet (20 mEq total) by mouth daily. 90 tablet 3   tamsulosin (FLOMAX) 0.4 MG CAPS capsule Take 1 capsule (0.4 mg total) by mouth every other day. 90 capsule 3   tolterodine (DETROL LA) 4 MG 24 hr capsule Take 1 capsule (4 mg total) by mouth every other day. Alternating days with the tamsulosin 90 capsule 3   torsemide (DEMADEX) 20 MG  tablet Take 40 mg daily alternating with 20 mg daily 150 tablet 11   warfarin (COUMADIN) 5 MG tablet Take 0.5-1 tablets (2.5-5 mg total) by mouth See admin instructions. 1/19 and 1/20  take 7.5mg  (1 and 1/2 tab) then take 1 tab (5mg ) daily until seen by MD  Take 1 tablet (5 mg) by mouth on Sundays, Mondays, Wednesdays, Thursdays & Saturdays in the evening. Take 0.5 tablet (2.5 mg) by mouth on Tuesdays & Fridays in the evening. (Patient taking differently: Take 2.5-5 mg by mouth See admin instructions. Take 2.5 mg every evening with supper on Monday, Friday. 5 mg with supper all  other days.) 45 tablet 11   No facility-administered medications prior to visit.    Review of Systems  Constitutional:  Negative for chills, fever, malaise/fatigue and weight loss.  HENT:  Negative for congestion, sinus pain and sore throat.   Eyes: Negative.   Respiratory:  Positive for cough and shortness of breath. Negative for hemoptysis, sputum production and wheezing.   Cardiovascular:  Negative for chest pain, palpitations, orthopnea, claudication and leg swelling.  Gastrointestinal:  Negative for abdominal pain, heartburn, nausea and vomiting.  Genitourinary: Negative.   Musculoskeletal:  Negative for joint pain and myalgias.  Skin:  Negative for rash.  Neurological:  Negative for weakness.  Endo/Heme/Allergies: Negative.   Psychiatric/Behavioral: Negative.      Objective:   Vitals:   09/17/23 0917  BP: 107/74  Pulse: (!) 101  SpO2: 96%  Weight: 194 lb 6.4 oz (88.2 kg)  Height: 5\' 10"  (1.778 m)    Physical  Exam Constitutional:      General: He is not in acute distress.    Appearance: Normal appearance.  Eyes:     General: No scleral icterus.    Conjunctiva/sclera: Conjunctivae normal.  Cardiovascular:     Rate and Rhythm: Normal rate and regular rhythm.  Pulmonary:     Breath sounds: Rales (scattered bilateral) present. No wheezing or rhonchi.  Musculoskeletal:     Right lower leg: No edema.     Left lower leg: No edema.  Skin:    General: Skin is warm and dry.  Neurological:     General: No focal deficit present.    CBC    Component Value Date/Time   WBC 12.3 (H) 10/19/2022 0139   RBC 6.77 (H) 10/19/2022 0139   HGB 13.0 10/19/2022 0139   HGB 13.6 04/16/2018 0817   HGB 13.5 06/19/2017 1034   HCT 42.3 10/19/2022 0139   HCT 40.0 06/19/2017 1034   PLT 262 10/19/2022 0139   PLT 182 04/16/2018 0817   PLT 187 06/19/2017 1034   MCV 62.5 (L) 10/19/2022 0139   MCV 60 (L) 06/19/2017 1034   MCH 19.2 (L) 10/19/2022 0139   MCHC 30.7 10/19/2022 0139   RDW 18.3 (H) 10/19/2022 0139   RDW 18.5 (H) 06/19/2017 1034   LYMPHSABS 1.9 04/16/2018 0817   LYMPHSABS 1.8 06/19/2017 1034   MONOABS 0.7 04/16/2018 0817   EOSABS 0.3 04/16/2018 0817   EOSABS 0.4 06/19/2017 1034   BASOSABS 0.1 04/16/2018 0817   BASOSABS 0.1 06/19/2017 1034      Latest Ref Rng & Units 03/12/2023    8:46 AM 02/28/2023   11:16 AM 02/07/2023    9:28 AM  BMP  Glucose 70 - 99 mg/dL 161  096  045   BUN 8 - 23 mg/dL 29  23  22    Creatinine 0.61 - 1.24 mg/dL 4.09  8.11  9.14  Sodium 135 - 145 mmol/L 136  139  137   Potassium 3.5 - 5.1 mmol/L 4.0  3.8  4.6   Chloride 98 - 111 mmol/L 98  97  103   CO2 22 - 32 mmol/L 27  26  28    Calcium 8.9 - 10.3 mg/dL 8.7  9.1  8.9    Chest imaging: CXR 09/07/23 1. Extensive chronic patchy peripheral greater than peribronchovascular reticular opacities and mild distortion throughout both lungs with a lower lung predominance, similar to mildly worsened, most compatible with chronic  interstitial lung disease. No superimposed acute consolidative airspace disease. 2. Stable mild cardiomegaly.   CT Chest/Abd/Pelvis 3/35/34 1. Small to moderate bilateral effusions. Heterogeneous right greater than left ground-glass densities and irregular consolidations suspicious for multifocal pneumonia to include atypical or viral organisms. 2. No CT evidence for acute intra-abdominal or pelvic abnormality. Diverticular disease of the colon without acute inflammatory process. 3. Small umbilical hernia containing fat and anterior wall of small bowel. There is edema within the subcutaneous fat surrounding the hernia but no inflammation within the hernia sac. Correlate with direct inspection. 4. Numerous enhancing collateral vessels at the right shoulder and chest wall, with possible stenosis of the right central subclavian vein. 5. Aortic atherosclerosis.  PFT:    Latest Ref Rng & Units 11/02/2016    8:46 AM  PFT Results  FVC-Pre L 3.06   FVC-Predicted Pre % 72   Pre FEV1/FVC % % 82   FEV1-Pre L 2.50   FEV1-Predicted Pre % 82   DLCO uncorrected ml/min/mmHg 16.46   DLCO UNC% % 50   DLVA Predicted % 72   TLC L 5.83   TLC % Predicted % 82   RV % Predicted % 100     Labs:  Path:  Echo:  Heart Catheterization:       Assessment & Plan:   ILD (interstitial lung disease) (HCC) - Plan: ANA, ANCA screen with reflex titer, Aldolase, Anti-Smith antibody, C-reactive protein, Cyclic citrul peptide antibody, IgG, Hypersensitivity Pneumonitis, Rheumatoid factor, Sedimentation rate, RNP Antibodies, Sjogren's syndrome antibods(ssa + ssb), predniSONE (DELTASONE) 10 MG tablet, CT CHEST HIGH RESOLUTION, Sjogren's syndrome antibods(ssa + ssb), RNP Antibodies, Sedimentation rate, Rheumatoid factor, Hypersensitivity Pneumonitis, Cyclic citrul peptide antibody, IgG, C-reactive protein, Anti-Smith antibody, Aldolase, ANCA screen with reflex titer, ANA  Dyspnea, unspecified  type  Discussion: Adrian Lambert is an 83 year male, never smoker with history of CHF, paroxysmal atrial fibrillation and aortic stenosis s/p mechanical valve replacement who is referred to pulmonary clinic for shortness of breath and cough.   Interstitial Lung Disease Chronic Cough New onset cough for 1 month with associated shortness of breath, particularly with exertion and coughing fits. No mucus production, sinus congestion, heartburn, or smoking history. Noted secondhand smoke exposure in childhood. No known occupational exposures. Chest X-ray and CT scan from last year show scattered areas of inflammation or scarring in the lungs, more pronounced in the right lung. -Order high-resolution CT scan for further evaluation. -Order labs to check for possible inflammation of the lungs. -Start prednisone taper for 20 days to alleviate cough and assess response to anti-inflammatory treatment.  Artificial Aortic Valve On Coumadin for anticoagulation. -Advise patient to consult with cardiologist regarding potential interaction between Coumadin and prednisone.  Follow-up in 4 weeks to assess response to steroid taper, review lab results, and discuss CT scan findings. If any issues arise with prednisone taper, patient is advised to contact the office.  Melody Comas, MD [facility]  Office: [phone]   Current Outpatient Medications:    acetaminophen (TYLENOL) 500 MG tablet, Take 500 mg by mouth 2 (two) times daily., Disp: , Rfl:    allopurinol (ZYLOPRIM) 300 MG tablet, Take 150 mg by mouth daily., Disp: , Rfl:    amoxicillin (AMOXIL) 500 MG capsule, Take 2,000 mg by mouth See admin instructions. Take 2000 mg 1 hour prior to dental work, Disp: , Rfl:    aspirin EC 81 MG tablet, Take 81 mg by mouth daily. Swallow whole., Disp: , Rfl:    benzonatate (TESSALON) 200 MG capsule, Take 200 mg by mouth 3 (three) times daily as needed for cough., Disp: , Rfl:    colchicine 0.6  MG tablet, Take 1 tablet (0.6 mg total) by mouth daily as needed (for gout)., Disp: , Rfl:    dapagliflozin propanediol (FARXIGA) 10 MG TABS tablet, Take 1 tablet (10 mg total) by mouth daily., Disp: 30 tablet, Rfl: 6   docusate sodium (COLACE) 100 MG capsule, Take 100 mg by mouth 2 (two) times daily., Disp: , Rfl:    ENTRESTO 24-26 MG, TAKE 1 TABLET BY MOUTH TWICE A DAY, Disp: 180 tablet, Rfl: 2   FIBER PO, Take 2 capsules by mouth 2 (two) times daily., Disp: , Rfl:    folic acid (FOLVITE) 1 MG tablet, TAKE 1 TABLET(1 MG) BY MOUTH DAILY, Disp: 90 tablet, Rfl: 3   metoprolol succinate (TOPROL-XL) 25 MG 24 hr tablet, TAKE 1 TABLET BY MOUTH EVERYDAY AT BEDTIME, Disp: 90 tablet, Rfl: 1   polyethylene glycol powder (GLYCOLAX/MIRALAX) 17 GM/SCOOP powder, Take 17 g by mouth 2 (two) times daily as needed for moderate constipation or mild constipation., Disp: 238 g, Rfl: 2   potassium chloride SA (KLOR-CON M) 20 MEQ tablet, Take 1 tablet (20 mEq total) by mouth daily., Disp: 90 tablet, Rfl: 3   predniSONE (DELTASONE) 10 MG tablet, Take 4 tablets (40 mg total) by mouth daily with breakfast for 5 days, THEN 3 tablets (30 mg total) daily with breakfast for 5 days, THEN 2 tablets (20 mg total) daily with breakfast for 5 days, THEN 1 tablet (10 mg total) daily with breakfast for 5 days, THEN 0.5 tablets (5 mg total) daily with breakfast for 5 days., Disp: 53 tablet, Rfl: 0   tamsulosin (FLOMAX) 0.4 MG CAPS capsule, Take 1 capsule (0.4 mg total) by mouth every other day., Disp: 90 capsule, Rfl: 3   tolterodine (DETROL LA) 4 MG 24 hr capsule, Take 1 capsule (4 mg total) by mouth every other day. Alternating days with the tamsulosin, Disp: 90 capsule, Rfl: 3   torsemide (DEMADEX) 20 MG tablet, Take 40 mg daily alternating with 20 mg daily, Disp: 150 tablet, Rfl: 11   warfarin (COUMADIN) 5 MG tablet, Take 0.5-1 tablets (2.5-5 mg total) by mouth See admin instructions. 1/19 and 1/20  take 7.5mg  (1 and 1/2 tab) then take  1 tab (5mg ) daily until seen by MD  Take 1 tablet (5 mg) by mouth on Sundays, Mondays, Wednesdays, Thursdays & Saturdays in the evening. Take 0.5 tablet (2.5 mg) by mouth on Tuesdays & Fridays in the evening. (Patient taking differently: Take 2.5-5 mg by mouth See admin instructions. Take 2.5 mg every evening with supper on Monday, Friday. 5 mg with supper all  other days.), Disp: 45 tablet, Rfl: 11

## 2023-09-17 NOTE — Patient Instructions (Addendum)
 Your recent chest x-ray and CT scan from last year are concerning for interstitial lung disease.  We will schedule you for a high resolution CT Chest scan in the coming weeks at [facility]  We will check inflammatory labs today  Start steroid taper 40mg  daily x 5 days 30mg  daily x 5 days 20mg  daily x 5 days 10mg  daily x 5 days  Follow up in 1 month, call sooner if needed

## 2023-09-19 DIAGNOSIS — Z7901 Long term (current) use of anticoagulants: Secondary | ICD-10-CM | POA: Diagnosis not present

## 2023-09-20 LAB — SJOGREN'S SYNDROME ANTIBODS(SSA + SSB)
SSA (Ro) (ENA) Antibody, IgG: <1 AI
SSB (La) (ENA) Antibody, IgG: <1 AI

## 2023-09-20 LAB — RHEUMATOID FACTOR: Rheumatoid fact SerPl-aCnc: 15 [IU]/mL — ABNORMAL HIGH (ref <14)

## 2023-09-20 LAB — CYCLIC CITRUL PEPTIDE ANTIBODY, IGG: Cyclic Citrullin Peptide Ab: <16 U

## 2023-09-20 LAB — ALDOLASE: Aldolase: 8.9 U/L — ABNORMAL HIGH (ref <=8.1)

## 2023-09-20 LAB — ANCA SCREEN W REFLEX TITER: ANCA SCREEN: NEGATIVE

## 2023-09-20 LAB — ANTI-SMITH ANTIBODY: ENA SM Ab Ser-aCnc: <1 AI

## 2023-09-20 LAB — ANA: Anti Nuclear Antibody (ANA): NEGATIVE

## 2023-09-20 NOTE — Telephone Encounter (Signed)
 Pt seen by pulmonology 2/24

## 2023-09-21 LAB — HYPERSENSITIVITY PNEUMONITIS
A. Pullulans Abs: NEGATIVE
A.Fumigatus #1 Abs: NEGATIVE
Micropolyspora faeni, IgG: NEGATIVE
Pigeon Serum Abs: NEGATIVE
Thermoact. Saccharii: NEGATIVE
Thermoactinomyces vulgaris, IgG: NEGATIVE

## 2023-09-21 LAB — RNP ANTIBODIES: ENA RNP Ab: <0.2 AI (ref 0.0–0.9)

## 2023-09-24 ENCOUNTER — Ambulatory Visit (HOSPITAL_BASED_OUTPATIENT_CLINIC_OR_DEPARTMENT_OTHER)
Admission: RE | Admit: 2023-09-24 | Discharge: 2023-09-24 | Disposition: A | Discharge status: Home / Self Care | Payer: Medicare Other | Source: Ambulatory Visit | Attending: Pulmonary Disease | Admitting: Pulmonary Disease

## 2023-09-24 DIAGNOSIS — R918 Other nonspecific abnormal finding of lung field: Secondary | ICD-10-CM | POA: Diagnosis not present

## 2023-09-24 DIAGNOSIS — I7121 Aneurysm of the ascending aorta, without rupture: Secondary | ICD-10-CM | POA: Diagnosis not present

## 2023-09-24 DIAGNOSIS — J849 Interstitial pulmonary disease, unspecified: Secondary | ICD-10-CM | POA: Insufficient documentation

## 2023-09-25 DIAGNOSIS — Z7901 Long term (current) use of anticoagulants: Secondary | ICD-10-CM | POA: Diagnosis not present

## 2023-10-03 DIAGNOSIS — C4442 Squamous cell carcinoma of skin of scalp and neck: Secondary | ICD-10-CM | POA: Diagnosis not present

## 2023-10-09 DIAGNOSIS — Z7901 Long term (current) use of anticoagulants: Secondary | ICD-10-CM | POA: Diagnosis not present

## 2023-10-09 DIAGNOSIS — I959 Hypotension, unspecified: Secondary | ICD-10-CM | POA: Diagnosis not present

## 2023-10-16 ENCOUNTER — Encounter: Payer: Self-pay | Admitting: Pulmonary Disease

## 2023-10-16 ENCOUNTER — Ambulatory Visit: Payer: Medicare Other | Admitting: Pulmonary Disease

## 2023-10-16 VITALS — BP 95/62 | HR 93 | Ht 70.0 in | Wt 190.6 lb

## 2023-10-16 DIAGNOSIS — J849 Interstitial pulmonary disease, unspecified: Secondary | ICD-10-CM | POA: Diagnosis not present

## 2023-10-16 NOTE — Progress Notes (Signed)
 Synopsis: Referred in February 2024 for cough  Subjective:   PATIENT ID: Adrian Lambert GENDER: male DOB: 16-Mar-1941, MRN: [id number]  HPI  Chief Complaint  Patient presents with   Follow-up    Pt states no concerns    Adrian Lambert is an 83 year male, never smoker with history of CHF, paroxysmal atrial fibrillation and aortic stenosis s/p mechanical valve replacement who returns to pulmonary clinic for shortness of breath and cough related to ILD.   OV 09/17/23 He has been experiencing a persistent cough for about a month, accompanied by shortness of breath, especially during physical exertion such as climbing stairs. The cough is primarily dry with occasional mucus production and disrupts his sleep at night. No sinus congestion, drainage, or heartburn. He denies any recent exposure to dust, chemicals, or asbestos.  He has a history of an artificial aortic valve replacement and atrial fibrillation, for which he is on Coumadin. He denies significant leg swelling, although minor swelling is noted. He has experienced a rash on his feet in the past due to a pain medication, which resolved after discontinuation. No joint pains or muscle aches.  He reports a dry mouth, which he associates with his cough and medication use. No dry eyes. He has no known diabetes. He has taken prednisone in the past, requiring adjustments to his Coumadin dosage. He monitors his weight to manage fluid retention and adjusts his torsemide dosage accordingly.  His wife quit smoking in 2011 after many years of smoking in the home. He was exposed to secondhand smoke from his grandparents during childhood. 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 He was in the [employer]. Denies chemical or dust exposures.  PFTs 2018 showed normal spirometry with moderate diffusion defect.  OV 10/16/23 He has experienced significant improvement in his symptoms, particularly the cough, following prednisone treatment. The cough is now  almost resolved, and he has completed the course of prednisone without experiencing any major side effects.  A recent CT chest scan from the beginning of the month showed some improvement compared to a scan from last year, but there is still chronic scarring and increased white patchiness in some areas of the lung.  No muscle pain, irritation, fevers, chills, or significant exposure to smoke or dust beyond normal household levels. He is not engaged in activities that produce dust, such as woodworking or pottery. No major side effects from the steroid, such as weight gain, increased appetite, or swelling, and he is currently sleeping better than before.  Past Medical History:  Diagnosis Date   Anticoagulant long-term use    Arthritis    "probably in my thumbs" (06/26/2012)   Bifascicular block    Chronic combined systolic and diastolic CHF (congestive heart failure) (HCC)    a. 01/2016 Echo: EF 45-50%, gr2 DD, inflat AK (poor acoustic windows);  b. 07/2016 Echo: EF 25-30%, mild LVH, PASP (pt in Afib).   Complication of anesthesia    "wake up w/a start; hallucinations" (06/26/2012)   Critical Aortic Stenosis    a. 03/2003 s/p SJM #25 mech prosthetic AoV;  b. 07/2016 Echo: EF 25-30% (in setting of AF), AoV area 1.72 cm^2 (VTI), 1.75 cm^2 (Vmean).   Diverticulosis    Gouty arthritis    "have had it in both feet, ankles, right knee" (06/26/2012)   History of diverticulitis of colon    History of kidney stones    History of pancreatitis    Hypertension    Incomplete RBBB  PAF (paroxysmal atrial fibrillation) (HCC)    a. CHA2DS2VASc = 4-->chronic coumadin in setting of mech AoV;  b. 07/2016 Recurrent AF RVR.     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    Comment: retired/[employer]  Tobacco Use   Smoking status: Never   Smokeless tobacco: Never  Vaping Use   Vaping status: Never Used  Substance and Sexual Activity   Alcohol use: Yes    Alcohol/week: 3.0 standard drinks of alcohol    Types: 3 Cans of beer per week    Comment:  "may have 2-3 beers on the weekend"   Drug use: No   Sexual activity: Not on file  Other Topics Concern   Not on file  Social History Narrative   Not on file   Social Drivers of Health   Financial Resource Strain: Not on file  Food Insecurity: Patient Declined (10/16/2022)   Hunger Vital Sign    Worried About Running Out of Food in the Last Year: Patient declined    Ran Out of Food in the Last Year: Patient declined  Transportation Needs: No Transportation Needs (10/16/2022)   PRAPARE - Administrator, Civil Service (Medical): No    Lack of Transportation (Non-Medical): No  Physical Activity: Not on file  Stress: Not on file  Social Connections: Not on file  Intimate Partner Violence: Not At Risk (10/16/2022)   Humiliation, Afraid, Rape, and Kick questionnaire    Fear of Current or Ex-Partner: No    Emotionally Abused: No    Physically Abused: No    Sexually Abused: No     Allergies  Allergen Reactions   Celecoxib Rash    Other Reaction(s): Unknown     Outpatient Medications Prior to Visit  Medication Sig Dispense Refill   acetaminophen (TYLENOL) 500 MG tablet Take 500 mg by mouth 2 (two) times daily.     allopurinol (ZYLOPRIM) 300 MG tablet Take 150 mg by mouth daily.     amoxicillin (AMOXIL) 500 MG capsule Take 2,000 mg by mouth See admin instructions. Take 2000 mg 1 hour prior to dental work     aspirin EC 81 MG tablet Take 81 mg by mouth daily. Swallow whole.     benzonatate (TESSALON) 200 MG capsule Take 200 mg by mouth 3 (three) times daily as needed for cough.     colchicine 0.6 MG tablet Take 1  tablet (0.6 mg total) by mouth daily as needed (for gout).     dapagliflozin propanediol (FARXIGA) 10 MG TABS tablet Take 1 tablet (10 mg total) by mouth daily. 30 tablet 6   docusate sodium (COLACE) 100 MG capsule Take 100 mg by mouth 2 (two) times daily.     ENTRESTO 24-26 MG TAKE 1 TABLET BY MOUTH TWICE A DAY 180 tablet 2   FIBER PO Take 2 capsules by mouth 2 (two) times daily.     folic acid (FOLVITE) 1 MG tablet TAKE 1 TABLET(1 MG) BY MOUTH DAILY 90 tablet 3   metoprolol succinate (TOPROL-XL) 25 MG 24 hr tablet TAKE 1 TABLET BY  MOUTH EVERYDAY AT BEDTIME 90 tablet 1   polyethylene glycol powder (GLYCOLAX/MIRALAX) 17 GM/SCOOP powder Take 17 g by mouth 2 (two) times daily as needed for moderate constipation or mild constipation. 238 g 2   potassium chloride SA (KLOR-CON M) 20 MEQ tablet Take 1 tablet (20 mEq total) by mouth daily. 90 tablet 3   tamsulosin (FLOMAX) 0.4 MG CAPS capsule Take 1 capsule (0.4 mg total) by mouth every other day. 90 capsule 3   tolterodine (DETROL LA) 4 MG 24 hr capsule Take 1 capsule (4 mg total) by mouth every other day. Alternating days with the tamsulosin 90 capsule 3   torsemide (DEMADEX) 20 MG tablet Take 40 mg daily alternating with 20 mg daily 150 tablet 11   warfarin (COUMADIN) 5 MG tablet Take 0.5-1 tablets (2.5-5 mg total) by mouth See admin instructions. 1/19 and 1/20  take 7.5mg  (1 and 1/2 tab) then take 1 tab (5mg ) daily until seen by MD  Take 1 tablet (5 mg) by mouth on Sundays, Mondays, Wednesdays, Thursdays & Saturdays in the evening. Take 0.5 tablet (2.5 mg) by mouth on Tuesdays & Fridays in the evening. (Patient taking differently: Take 2.5-5 mg by mouth See admin instructions. Take 2.5 mg every evening with supper on Monday, Friday. 5 mg with supper all  other days.) 45 tablet 11   No facility-administered medications prior to visit.    Review of Systems  Constitutional:  Negative for chills, fever, malaise/fatigue and weight loss.  HENT:  Negative  for congestion, sinus pain and sore throat.   Eyes: Negative.   Respiratory:  Negative for cough, hemoptysis, sputum production, shortness of breath and wheezing.   Cardiovascular:  Negative for chest pain, palpitations, orthopnea, claudication and leg swelling.  Gastrointestinal:  Negative for abdominal pain, heartburn, nausea and vomiting.  Genitourinary: Negative.   Musculoskeletal:  Negative for joint pain and myalgias.  Skin:  Negative for rash.  Neurological:  Negative for weakness.  Endo/Heme/Allergies: Negative.   Psychiatric/Behavioral: Negative.      Objective:   Vitals:   10/16/23 0918  BP: 95/62  Pulse: 93  SpO2: 97%  Weight: 190 lb 9.6 oz (86.5 kg)  Height: 5\' 10"  (1.778 m)     Physical Exam Constitutional:      General: He is not in acute distress.    Appearance: Normal appearance.  Eyes:     General: No scleral icterus.    Conjunctiva/sclera: Conjunctivae normal.  Cardiovascular:     Rate and Rhythm: Normal rate and regular rhythm.  Pulmonary:     Breath sounds: No wheezing, rhonchi or rales.     Comments: Inspiratory chirps - upper lung fields Musculoskeletal:     Right lower leg: No edema.     Left lower leg: No edema.  Skin:    General: Skin is warm and dry.  Neurological:     General: No focal deficit present.    CBC    Component Value Date/Time   WBC 12.3 (H) 10/19/2022 0139   RBC 6.77 (H) 10/19/2022 0139   HGB 13.0 10/19/2022 0139   HGB 13.6 04/16/2018 0817   HGB 13.5 06/19/2017 1034   HCT 42.3 10/19/2022 0139   HCT 40.0 06/19/2017 1034   PLT 262 10/19/2022 0139   PLT 182 04/16/2018 0817   PLT 187 06/19/2017 1034   MCV 62.5 (L) 10/19/2022 0139   MCV 60 (L) 06/19/2017 1034   MCH 19.2 (L) 10/19/2022 0139   MCHC 30.7 10/19/2022 0139  RDW 18.3 (H) 10/19/2022 0139   RDW 18.5 (H) 06/19/2017 1034   LYMPHSABS 1.9 04/16/2018 0817   LYMPHSABS 1.8 06/19/2017 1034   MONOABS 0.7 04/16/2018 0817   EOSABS 0.3 04/16/2018 0817   EOSABS 0.4  06/19/2017 1034   BASOSABS 0.1 04/16/2018 0817   BASOSABS 0.1 06/19/2017 1034      Latest Ref Rng & Units 03/12/2023    8:46 AM 02/28/2023   11:16 AM 02/07/2023    9:28 AM  BMP  Glucose 70 - 99 mg/dL 161  096  045   BUN 8 - 23 mg/dL 29  23  22    Creatinine 0.61 - 1.24 mg/dL 4.09  8.11  9.14   Sodium 135 - 145 mmol/L 136  139  137   Potassium 3.5 - 5.1 mmol/L 4.0  3.8  4.6   Chloride 98 - 111 mmol/L 98  97  103   CO2 22 - 32 mmol/L 27  26  28    Calcium 8.9 - 10.3 mg/dL 8.7  9.1  8.9    Chest imaging: HRCT Chest 09/24/23 Mediastinum/Nodes: No pathologically enlarged mediastinal or axillary lymph nodes. Hilar regions are difficult to definitively evaluate without IV contrast. Esophagus is grossly unremarkable.   Lungs/Pleura: Patchy coarsened ground-glass with traction bronchiectasis/bronchiolectasis and subpleural reticulation, without a definite zonal predominance. Findings appear more organized than on 10/16/2022. No definite honeycombing. Tiny bilateral pleural effusions. Airway is unremarkable. There is air trapping.  CXR 09/07/23 1. Extensive chronic patchy peripheral greater than peribronchovascular reticular opacities and mild distortion throughout both lungs with a lower lung predominance, similar to mildly worsened, most compatible with chronic interstitial lung disease. No superimposed acute consolidative airspace disease. 2. Stable mild cardiomegaly.  CT Chest/Abd/Pelvis 10/16/22 1. Small to moderate bilateral effusions. Heterogeneous right greater than left ground-glass densities and irregular consolidations suspicious for multifocal pneumonia to include atypical or viral organisms. 2. No CT evidence for acute intra-abdominal or pelvic abnormality. Diverticular disease of the colon without acute inflammatory process. 3. Small umbilical hernia containing fat and anterior wall of small bowel. There is edema within the subcutaneous fat surrounding the hernia but no  inflammation within the hernia sac. Correlate with direct inspection. 4. Numerous enhancing collateral vessels at the right shoulder and chest wall, with possible stenosis of the right central subclavian vein. 5. Aortic atherosclerosis.  PFT:    Latest Ref Rng & Units 11/02/2016    8:46 AM  PFT Results  FVC-Pre L 3.06   FVC-Predicted Pre % 72   Pre FEV1/FVC % % 82   FEV1-Pre L 2.50   FEV1-Predicted Pre % 82   DLCO uncorrected ml/min/mmHg 16.46   DLCO UNC% % 50   DLVA Predicted % 72   TLC L 5.83   TLC % Predicted % 82   RV % Predicted % 100     Labs:  Path:  Echo:  Heart Catheterization:     Assessment & Plan:   ILD (interstitial lung disease) (HCC) - Plan: MyoMarker 3 Plus Profile (RDL)  Discussion: Iniko Robles is an 83 year male, never smoker with history of CHF, paroxysmal atrial fibrillation and aortic stenosis s/p mechanical valve replacement who returns to pulmonary clinic for shortness of breath and cough related to ILD.   Interstitial Lung Disease Chronic Cough Cough improved with prednisone trial. 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 Inflammatory panel notable for mildly elevated aldolase level. -Check myositis panel -continue to monitor symptoms for now -will review case at ILD conference  to determine if prolonged steroid course vs mycophenolate is necessary along with anti-fibrotic therapy. - will need ONO testing in the future and walk testing  Artificial Aortic Valve On Coumadin for anticoagulation. -Advise patient to consult with cardiologist regarding potential interaction between Coumadin and prednisone.  Follow up in 6 weeks  Melody Comas, MD [facility] Office: [phone]   Current Outpatient Medications:    acetaminophen (TYLENOL) 500 MG tablet, Take 500 mg by mouth 2 (two) times daily., Disp: , Rfl:    allopurinol (ZYLOPRIM) 300 MG tablet, Take 150 mg by mouth daily.,  Disp: , Rfl:    amoxicillin (AMOXIL) 500 MG capsule, Take 2,000 mg by mouth See admin instructions. Take 2000 mg 1 hour prior to dental work, Disp: , Rfl:    aspirin EC 81 MG tablet, Take 81 mg by mouth daily. Swallow whole., Disp: , Rfl:    benzonatate (TESSALON) 200 MG capsule, Take 200 mg by mouth 3 (three) times daily as needed for cough., Disp: , Rfl:    colchicine 0.6 MG tablet, Take 1 tablet (0.6 mg total) by mouth daily as needed (for gout)., Disp: , Rfl:    dapagliflozin propanediol (FARXIGA) 10 MG TABS tablet, Take 1 tablet (10 mg total) by mouth daily., Disp: 30 tablet, Rfl: 6   docusate sodium (COLACE) 100 MG capsule, Take 100 mg by mouth 2 (two) times daily., Disp: , Rfl:    ENTRESTO 24-26 MG, TAKE 1 TABLET BY MOUTH TWICE A DAY, Disp: 180 tablet, Rfl: 2   FIBER PO, Take 2 capsules by mouth 2 (two) times daily., Disp: , Rfl:    folic acid (FOLVITE) 1 MG tablet, TAKE 1 TABLET(1 MG) BY MOUTH DAILY, Disp: 90 tablet, Rfl: 3   metoprolol succinate (TOPROL-XL) 25 MG 24 hr tablet, TAKE 1 TABLET BY MOUTH EVERYDAY AT BEDTIME, Disp: 90 tablet, Rfl: 1   polyethylene glycol powder (GLYCOLAX/MIRALAX) 17 GM/SCOOP powder, Take 17 g by mouth 2 (two) times daily as needed for moderate constipation or mild constipation., Disp: 238 g, Rfl: 2   potassium chloride SA (KLOR-CON M) 20 MEQ tablet, Take 1 tablet (20 mEq total) by mouth daily., Disp: 90 tablet, Rfl: 3   tamsulosin (FLOMAX) 0.4 MG CAPS capsule, Take 1 capsule (0.4 mg total) by mouth every other day., Disp: 90 capsule, Rfl: 3   tolterodine (DETROL LA) 4 MG 24 hr capsule, Take 1 capsule (4 mg total) by mouth every other day. Alternating days with the tamsulosin, Disp: 90 capsule, Rfl: 3   torsemide (DEMADEX) 20 MG tablet, Take 40 mg daily alternating with 20 mg daily, Disp: 150 tablet, Rfl: 11   warfarin (COUMADIN) 5 MG tablet, Take 0.5-1 tablets (2.5-5 mg total) by mouth See admin instructions. 1/19 and 1/20  take 7.5mg  (1 and 1/2 tab) then take 1  tab (5mg ) daily until seen by MD  Take 1 tablet (5 mg) by mouth on Sundays, Mondays, Wednesdays, Thursdays & Saturdays in the evening. Take 0.5 tablet (2.5 mg) by mouth on Tuesdays & Fridays in the evening. (Patient taking differently: Take 2.5-5 mg by mouth See admin instructions. Take 2.5 mg every evening with supper on Monday, Friday. 5 mg with supper all  other days.), Disp: 45 tablet, Rfl: 11

## 2023-10-16 NOTE — Patient Instructions (Signed)
 We will give you an interstitial lung disease questionnaire to complete  I am glad your cough has improved  I will add your case to our Interstitial Lung Disease conference to determine best long term plan  We will check a Myositis Panel today as your aldolase lab level was elevated  Follow up in 6 weeks, call sooner if needed

## 2023-10-23 DIAGNOSIS — Z7901 Long term (current) use of anticoagulants: Secondary | ICD-10-CM | POA: Diagnosis not present

## 2023-11-11 LAB — MYOMARKER 3 PLUS PROFILE (RDL)
Anti-EJ Ab (RDL): NEGATIVE
Anti-Jo-1 Ab (RDL): <20 U (ref <20)
Anti-Ku Ab (RDL): NEGATIVE
Anti-MDA-5 Ab (CADM-140)(RDL): <20 U (ref <20)
Anti-Mi-2 Ab (RDL): NEGATIVE
Anti-OJ Ab (RDL): NEGATIVE
Anti-PL-12 Ab (RDL: NEGATIVE
Anti-PL-7 Ab (RDL): NEGATIVE
Anti-PM/Scl-100 Ab (RDL): <20 U (ref <20)
Anti-SAE1 Ab, IgG (RDL): <20 U (ref <20)
Anti-SRP Ab (RDL): NEGATIVE
Anti-SS-A 52kD Ab, IgG (RDL): <20 U (ref <20)
Anti-TIF-1gamma Ab (RDL): <20 U (ref <20)
Anti-U1 RNP Ab (RDL): <20 U (ref <20)
Anti-U2 RNP Ab (RDL): NEGATIVE
Anti-U3 RNP (Fibrillarin)(RDL): NEGATIVE

## 2023-11-12 DIAGNOSIS — Z6830 Body mass index (BMI) 30.0-30.9, adult: Secondary | ICD-10-CM | POA: Diagnosis not present

## 2023-11-12 DIAGNOSIS — R059 Cough, unspecified: Secondary | ICD-10-CM | POA: Diagnosis not present

## 2023-11-14 DIAGNOSIS — J849 Interstitial pulmonary disease, unspecified: Secondary | ICD-10-CM | POA: Diagnosis not present

## 2023-11-14 DIAGNOSIS — J984 Other disorders of lung: Secondary | ICD-10-CM | POA: Diagnosis not present

## 2023-11-14 DIAGNOSIS — R053 Chronic cough: Secondary | ICD-10-CM | POA: Diagnosis not present

## 2023-11-16 DIAGNOSIS — R053 Chronic cough: Secondary | ICD-10-CM | POA: Diagnosis not present

## 2023-11-23 DIAGNOSIS — M62830 Muscle spasm of back: Secondary | ICD-10-CM | POA: Diagnosis not present

## 2023-11-29 DIAGNOSIS — Z7901 Long term (current) use of anticoagulants: Secondary | ICD-10-CM | POA: Diagnosis not present

## 2023-11-29 DIAGNOSIS — J069 Acute upper respiratory infection, unspecified: Secondary | ICD-10-CM | POA: Diagnosis not present

## 2023-11-29 DIAGNOSIS — Z92241 Personal history of systemic steroid therapy: Secondary | ICD-10-CM | POA: Diagnosis not present

## 2023-12-04 ENCOUNTER — Telehealth (HOSPITAL_COMMUNITY): Payer: Self-pay | Admitting: Cardiology

## 2023-12-04 NOTE — Telephone Encounter (Signed)
 Patient called to report he is back in AFIB per home cardiac monitor  Reports increase in SOB (pt is very winded during call)  Denies cp, palps/flutter Reports dizziness and a little off balance  Reports no missed doses of medications   Add on app given  5/19 @ 330 -advised if symptoms worsen report to ER dont wait for OV   Will forward to provider if addition input/changes are needed before OV

## 2023-12-05 ENCOUNTER — Encounter: Payer: Self-pay | Admitting: Pulmonary Disease

## 2023-12-05 ENCOUNTER — Ambulatory Visit: Admitting: Pulmonary Disease

## 2023-12-05 VITALS — BP 94/62 | HR 100 | Ht 70.0 in | Wt 190.4 lb

## 2023-12-05 DIAGNOSIS — J849 Interstitial pulmonary disease, unspecified: Secondary | ICD-10-CM | POA: Diagnosis not present

## 2023-12-05 NOTE — Patient Instructions (Addendum)
 We will walk you today to check your oxygen levels  We will schedule you for an overnight oxygen test on room air  We will monitor your cough and breathing for now. We will hold off on starting cellcept therapy for your cough and lung inflammation  Recommend using a wedge pillow to keep your head elevated at night  Follow up in 6 months

## 2023-12-05 NOTE — Progress Notes (Signed)
 Synopsis: Referred in February 2024 for cough  Subjective:   PATIENT ID: Adrian Lambert GENDER: male DOB: [birth year], MRN: [id number]  HPI  Chief Complaint  Patient presents with   Follow-up   Adrian Lambert is an 83 year male, never smoker with history of CHF, paroxysmal atrial fibrillation and aortic stenosis s/p mechanical valve replacement who returns to pulmonary clinic for shortness of breath and cough related to ILD.   OV 09/17/23 He has been experiencing a persistent cough for about a month, accompanied by shortness of breath, especially during physical exertion such as climbing stairs. The cough is primarily dry with occasional mucus production and disrupts his sleep at night. No sinus congestion, drainage, or heartburn. He denies any recent exposure to dust, chemicals, or asbestos.  He has a history of an artificial aortic valve replacement and atrial fibrillation, for which he is on Coumadin . He denies significant leg swelling, although minor swelling is noted. He has experienced a rash on his feet in the past due to a pain medication, which resolved after discontinuation. No joint pains or muscle aches.  He reports a dry mouth, which he associates with his cough and medication use. No dry eyes. He has no known diabetes. He has taken prednisone  in the past, requiring adjustments to his Coumadin  dosage. He monitors his weight to manage fluid retention and adjusts his torsemide  dosage accordingly.  His wife quit smoking in 2011 after many years of smoking in the home. He was exposed to secondhand smoke from his grandparents during childhood. 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 He was in the [employer]. Denies chemical or dust exposures.  PFTs 2018 showed normal spirometry with moderate diffusion defect.  OV 10/16/23 He has experienced significant improvement in his symptoms, particularly the cough, following prednisone  treatment. The cough is now almost resolved, and he has  completed the course of prednisone  without experiencing any major side effects.  A recent CT chest scan from the beginning of the month showed some improvement compared to a scan from last year, but there is still chronic scarring and increased white patchiness in some areas of the lung.  No muscle pain, irritation, fevers, chills, or significant exposure to smoke or dust beyond normal household levels. He is not engaged in activities that produce dust, such as woodworking or pottery. No major side effects from the steroid, such as weight gain, increased appetite, or swelling, and he is currently sleeping better than before.   OV 12/05/23 He had second opinion visit with Dr. Matilde Son of [facility], who was in agreement with our evaluation.  Patient reports taking another course of steroid and antibiotic in April since coming off the prolonged steroid taper for his cough from February-March.   He reports his cough is mainly in the morning after waking up. No other issues at this time. Wife has accompanied him today.  Past Medical History:  Diagnosis Date   Anticoagulant long-term use    Arthritis    "probably in my thumbs" (06/26/2012)   Bifascicular block    Chronic combined systolic and diastolic CHF (congestive heart failure) (HCC)    a. 01/2016 Echo: EF 45-50%, gr2 DD, inflat AK (poor acoustic windows);  b. 07/2016 Echo: EF 25-30%, mild LVH, PASP (pt in Afib).   Complication of anesthesia    "wake up w/a start; hallucinations" (06/26/2012)   Critical Aortic Stenosis    a. 03/2003 s/p SJM #25 mech prosthetic AoV;  b. 07/2016 Echo: EF 25-30% (in  setting of AF), AoV area 1.72 cm^2 (VTI), 1.75 cm^2 (Vmean).   Diverticulosis    Gouty arthritis    "have had it in both feet, ankles, right knee" (06/26/2012)   History of diverticulitis of colon    History of kidney stones    History of pancreatitis    Hypertension    Incomplete RBBB    PAF (paroxysmal atrial fibrillation) (HCC)    a.  CHA2DS2VASc = 4-->chronic coumadin  in setting of mech AoV;  b. 07/2016 Recurrent AF RVR.     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    Comment: retired/[employer]  Tobacco Use   Smoking status: Never   Smokeless tobacco: Never  Vaping Use   Vaping status: Never Used  Substance and Sexual Activity   Alcohol use: Yes    Alcohol/week: 3.0 standard drinks of alcohol    Types: 3 Cans of beer per week    Comment:  "may have 2-3 beers on the weekend"   Drug use: No   Sexual activity: Not on file  Other Topics Concern   Not on file  Social History Narrative   Not on file   Social Drivers of Health   Financial Resource Strain: Not on file  Food Insecurity: Low Risk  (11/14/2023)   Received from [facility]   Hunger Vital Sign    Worried About Running Out of Food in the Last Year: Never true    Ran Out of Food in the Last Year: Never true  Transportation Needs: No Transportation Needs (11/14/2023)   Received from [facility]    In the past 12 months, has lack of reliable transportation kept you from medical appointments, meetings, work or from getting things needed for daily living? : No  Physical Activity: Not on file  Stress: Not on file  Social Connections: Not on file  Intimate Partner Violence: Not At Risk (10/16/2022)   Humiliation, Afraid, Rape, and Kick questionnaire    Fear of Current or Ex-Partner: No    Emotionally Abused: No    Physically Abused: No    Sexually Abused: No     Allergies  Allergen Reactions   Celecoxib Rash    Other Reaction(s): Unknown     Outpatient Medications Prior to Visit  Medication Sig Dispense Refill    acetaminophen  (TYLENOL ) 500 MG tablet Take 500 mg by mouth 2 (two) times daily.     allopurinol  (ZYLOPRIM ) 300 MG tablet Take 150 mg by mouth daily.     amoxicillin  (AMOXIL ) 500 MG capsule Take 2,000 mg by mouth See admin instructions. Take 2000 mg 1 hour prior to dental work     aspirin  EC 81 MG tablet Take 81 mg by mouth daily. Swallow whole.     benzonatate  (TESSALON ) 200 MG capsule Take 200 mg by mouth 3 (three) times daily as needed for cough.     colchicine  0.6 MG tablet Take 1 tablet (0.6 mg total) by mouth daily as needed (for gout).     dapagliflozin  propanediol (FARXIGA ) 10 MG TABS tablet Take 1 tablet (10 mg  total) by mouth daily. 30 tablet 6   docusate sodium  (COLACE) 100 MG capsule Take 100 mg by mouth 2 (two) times daily.     ENTRESTO  24-26 MG TAKE 1 TABLET BY MOUTH TWICE A DAY 180 tablet 2   FIBER PO Take 2 capsules by mouth 2 (two) times daily.     folic acid  (FOLVITE ) 1 MG tablet TAKE 1 TABLET(1 MG) BY MOUTH DAILY 90 tablet 3   metoprolol  succinate (TOPROL -XL) 25 MG 24 hr tablet TAKE 1 TABLET BY MOUTH EVERYDAY AT BEDTIME 90 tablet 1   polyethylene glycol powder (GLYCOLAX /MIRALAX ) 17 GM/SCOOP powder Take 17 g by mouth 2 (two) times daily as needed for moderate constipation or mild constipation. 238 g 2   potassium chloride  SA (KLOR-CON  M) 20 MEQ tablet Take 1 tablet (20 mEq total) by mouth daily. 90 tablet 3   tamsulosin  (FLOMAX ) 0.4 MG CAPS capsule Take 1 capsule (0.4 mg total) by mouth every other day. 90 capsule 3   tolterodine  (DETROL  LA) 4 MG 24 hr capsule Take 1 capsule (4 mg total) by mouth every other day. Alternating days with the tamsulosin  90 capsule 3   torsemide  (DEMADEX ) 20 MG tablet Take 40 mg daily alternating with 20 mg daily 150 tablet 11   warfarin (COUMADIN ) 5 MG tablet Take 0.5-1 tablets (2.5-5 mg total) by mouth See admin instructions. 1/19 and 1/20  take 7.5mg  (1 and 1/2 tab) then take 1 tab (5mg ) daily until seen by MD  Take 1 tablet (5 mg) by mouth on  Sundays, Mondays, Wednesdays, Thursdays & Saturdays in the evening. Take 0.5 tablet (2.5 mg) by mouth on Tuesdays & Fridays in the evening. (Patient taking differently: Take 2.5-5 mg by mouth See admin instructions. Take 2.5 mg every evening with supper on Monday, Friday. 5 mg with supper all  other days.) 45 tablet 11   No facility-administered medications prior to visit.    Review of Systems  Constitutional:  Negative for chills, fever, malaise/fatigue and weight loss.  HENT:  Negative for congestion, sinus pain and sore throat.   Eyes: Negative.   Respiratory:  Positive for cough. Negative for hemoptysis, sputum production, shortness of breath and wheezing.   Cardiovascular:  Negative for chest pain, palpitations, orthopnea, claudication and leg swelling.  Gastrointestinal:  Negative for abdominal pain, heartburn, nausea and vomiting.  Genitourinary: Negative.   Musculoskeletal:  Negative for joint pain and myalgias.  Skin:  Negative for rash.  Neurological:  Negative for weakness.  Endo/Heme/Allergies: Negative.   Psychiatric/Behavioral: Negative.      Objective:   Vitals:   12/05/23 0944  BP: 94/62  Pulse: 100  SpO2: 95%  Weight: 190 lb 6.4 oz (86.4 kg)  Height: 5\' 10"  (1.778 m)   Physical Exam Constitutional:      General: He is not in acute distress.    Appearance: Normal appearance.  Eyes:     General: No scleral icterus.    Conjunctiva/sclera: Conjunctivae normal.  Cardiovascular:     Rate and Rhythm: Normal rate and regular rhythm.  Pulmonary:     Breath sounds: Rales (basilar) present. No wheezing or rhonchi.     Comments: Inspiratory chirps - scattered Musculoskeletal:     Right lower leg: No edema.     Left lower leg: No edema.  Skin:    General: Skin is warm and dry.  Neurological:     General: No focal deficit present.    CBC    Component Value Date/Time  WBC 12.3 (H) 10/19/2022 0139   RBC 6.77 (H) 10/19/2022 0139   HGB 13.0 10/19/2022 0139    HGB 13.6 04/16/2018 0817   HGB 13.5 06/19/2017 1034   HCT 42.3 10/19/2022 0139   HCT 40.0 06/19/2017 1034   PLT 262 10/19/2022 0139   PLT 182 04/16/2018 0817   PLT 187 06/19/2017 1034   MCV 62.5 (L) 10/19/2022 0139   MCV 60 (L) 06/19/2017 1034   MCH 19.2 (L) 10/19/2022 0139   MCHC 30.7 10/19/2022 0139   RDW 18.3 (H) 10/19/2022 0139   RDW 18.5 (H) 06/19/2017 1034   LYMPHSABS 1.9 04/16/2018 0817   LYMPHSABS 1.8 06/19/2017 1034   MONOABS 0.7 04/16/2018 0817   EOSABS 0.3 04/16/2018 0817   EOSABS 0.4 06/19/2017 1034   BASOSABS 0.1 04/16/2018 0817   BASOSABS 0.1 06/19/2017 1034      Latest Ref Rng & Units 03/12/2023    8:46 AM 02/28/2023   11:16 AM 02/07/2023    9:28 AM  BMP  Glucose 70 - 99 mg/dL 161  096  045   BUN 8 - 23 mg/dL 29  23  22    Creatinine 0.61 - 1.24 mg/dL 4.09  8.11  9.14   Sodium 135 - 145 mmol/L 136  139  137   Potassium 3.5 - 5.1 mmol/L 4.0  3.8  4.6   Chloride 98 - 111 mmol/L 98  97  103   CO2 22 - 32 mmol/L 27  26  28    Calcium 8.9 - 10.3 mg/dL 8.7  9.1  8.9    Chest imaging: HRCT Chest 09/24/23 Mediastinum/Nodes: No pathologically enlarged mediastinal or axillary lymph nodes. Hilar regions are difficult to definitively evaluate without IV contrast. Esophagus is grossly unremarkable.   Lungs/Pleura: Patchy coarsened ground-glass with traction bronchiectasis/bronchiolectasis and subpleural reticulation, without a definite zonal predominance. Findings appear more organized than on 10/16/2022. No definite honeycombing. Tiny bilateral pleural effusions. Airway is unremarkable. There is air trapping.  CXR 09/07/23 1. Extensive chronic patchy peripheral greater than peribronchovascular reticular opacities and mild distortion throughout both lungs with a lower lung predominance, similar to mildly worsened, most compatible with chronic interstitial lung disease. No superimposed acute consolidative airspace disease. 2. Stable mild cardiomegaly.  CT  Chest/Abd/Pelvis 10/16/22 1. Small to moderate bilateral effusions. Heterogeneous right greater than left ground-glass densities and irregular consolidations suspicious for multifocal pneumonia to include atypical or viral organisms. 2. No CT evidence for acute intra-abdominal or pelvic abnormality. Diverticular disease of the colon without acute inflammatory process. 3. Small umbilical hernia containing fat and anterior wall of small bowel. There is edema within the subcutaneous fat surrounding the hernia but no inflammation within the hernia sac. Correlate with direct inspection. 4. Numerous enhancing collateral vessels at the right shoulder and chest wall, with possible stenosis of the right central subclavian vein. 5. Aortic atherosclerosis.  PFT:    Latest Ref Rng & Units 11/02/2016    8:46 AM  PFT Results  FVC-Pre L 3.06   FVC-Predicted Pre % 72   Pre FEV1/FVC % % 82   FEV1-Pre L 2.50   FEV1-Predicted Pre % 82   DLCO uncorrected ml/min/mmHg 16.46   DLCO UNC% % 50   DLVA Predicted % 72   TLC L 5.83   TLC % Predicted % 82   RV % Predicted % 100     Labs:  Path:  Echo:  Heart Catheterization:     Assessment & Plan:   ILD (interstitial lung disease) (HCC) -  Plan: Pulse oximetry, overnight, Pulmonary Function Test  Discussion: Adrian Lambert is an 83 year male, never smoker with history of CHF, paroxysmal atrial fibrillation and aortic stenosis s/p mechanical valve replacement who returns to pulmonary clinic for shortness of breath and cough related to ILD.   Interstitial Lung Disease Chronic Cough Cough improved with prednisone  trial. 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 Inflammatory panel notable for mildly elevated aldolase level but myositis panel negative. - Discussed options of starting mycophenolate therapy vs monitoring and treating as needed with steroids/antibiotics. Risks/benefits of mycophenolate discussed. - Plan  will be to monitor at this time given concerns with risks of immunosuppression.  - will check simple walk test today - ordered ONO testing on room air   Follow up in 6 months  Duaine German, MD [facility] Office: [phone]   Current Outpatient Medications:    acetaminophen  (TYLENOL ) 500 MG tablet, Take 500 mg by mouth 2 (two) times daily., Disp: , Rfl:    allopurinol  (ZYLOPRIM ) 300 MG tablet, Take 150 mg by mouth daily., Disp: , Rfl:    amoxicillin  (AMOXIL ) 500 MG capsule, Take 2,000 mg by mouth See admin instructions. Take 2000 mg 1 hour prior to dental work, Disp: , Rfl:    aspirin  EC 81 MG tablet, Take 81 mg by mouth daily. Swallow whole., Disp: , Rfl:    benzonatate  (TESSALON ) 200 MG capsule, Take 200 mg by mouth 3 (three) times daily as needed for cough., Disp: , Rfl:    colchicine  0.6 MG tablet, Take 1 tablet (0.6 mg total) by mouth daily as needed (for gout)., Disp: , Rfl:    dapagliflozin  propanediol (FARXIGA ) 10 MG TABS tablet, Take 1 tablet (10 mg total) by mouth daily., Disp: 30 tablet, Rfl: 6   docusate sodium  (COLACE) 100 MG capsule, Take 100 mg by mouth 2 (two) times daily., Disp: , Rfl:    ENTRESTO  24-26 MG, TAKE 1 TABLET BY MOUTH TWICE A DAY, Disp: 180 tablet, Rfl: 2   FIBER PO, Take 2 capsules by mouth 2 (two) times daily., Disp: , Rfl:    folic acid  (FOLVITE ) 1 MG tablet, TAKE 1 TABLET(1 MG) BY MOUTH DAILY, Disp: 90 tablet, Rfl: 3   metoprolol  succinate (TOPROL -XL) 25 MG 24 hr tablet, TAKE 1 TABLET BY MOUTH EVERYDAY AT BEDTIME, Disp: 90 tablet, Rfl: 1   polyethylene glycol powder (GLYCOLAX /MIRALAX ) 17 GM/SCOOP powder, Take 17 g by mouth 2 (two) times daily as needed for moderate constipation or mild constipation., Disp: 238 g, Rfl: 2   potassium chloride  SA (KLOR-CON  M) 20 MEQ tablet, Take 1 tablet (20 mEq total) by mouth daily., Disp: 90 tablet, Rfl: 3   tamsulosin  (FLOMAX ) 0.4 MG CAPS capsule, Take 1 capsule (0.4 mg total) by mouth every  other day., Disp: 90 capsule, Rfl: 3   tolterodine  (DETROL  LA) 4 MG 24 hr capsule, Take 1 capsule (4 mg total) by mouth every other day. Alternating days with the tamsulosin , Disp: 90 capsule, Rfl: 3   torsemide  (DEMADEX ) 20 MG tablet, Take 40 mg daily alternating with 20 mg daily, Disp: 150 tablet, Rfl: 11   warfarin (COUMADIN ) 5 MG tablet, Take 0.5-1 tablets (2.5-5 mg total) by mouth See admin instructions. 1/19 and 1/20  take 7.5mg  (1 and 1/2 tab) then take 1 tab (5mg ) daily until seen by MD  Take 1 tablet (5 mg) by mouth on Sundays, Mondays, Wednesdays, Thursdays & Saturdays in the evening. Take 0.5 tablet (2.5 mg) by  mouth on Tuesdays & Fridays in the evening. (Patient taking differently: Take 2.5-5 mg by mouth See admin instructions. Take 2.5 mg every evening with supper on Monday, Friday. 5 mg with supper all  other days.), Disp: 45 tablet, Rfl: 11

## 2023-12-06 NOTE — Telephone Encounter (Signed)
 Pt reports he cannot come 5/16 @ 330 Would like to keep appt as scheduled

## 2023-12-10 ENCOUNTER — Ambulatory Visit (HOSPITAL_COMMUNITY)
Admission: RE | Admit: 2023-12-10 | Discharge: 2023-12-10 | Disposition: A | Discharge status: Home / Self Care | Source: Ambulatory Visit | Attending: Adult Health | Admitting: Adult Health

## 2023-12-10 ENCOUNTER — Encounter (HOSPITAL_COMMUNITY): Payer: Self-pay

## 2023-12-10 VITALS — BP 96/74 | HR 57 | Wt 198.6 lb

## 2023-12-10 DIAGNOSIS — I110 Hypertensive heart disease with heart failure: Secondary | ICD-10-CM | POA: Insufficient documentation

## 2023-12-10 DIAGNOSIS — Z952 Presence of prosthetic heart valve: Secondary | ICD-10-CM | POA: Insufficient documentation

## 2023-12-10 DIAGNOSIS — I428 Other cardiomyopathies: Secondary | ICD-10-CM | POA: Insufficient documentation

## 2023-12-10 DIAGNOSIS — Z7984 Long term (current) use of oral hypoglycemic drugs: Secondary | ICD-10-CM | POA: Diagnosis not present

## 2023-12-10 DIAGNOSIS — I4891 Unspecified atrial fibrillation: Secondary | ICD-10-CM | POA: Diagnosis not present

## 2023-12-10 DIAGNOSIS — I7121 Aneurysm of the ascending aorta, without rupture: Secondary | ICD-10-CM | POA: Insufficient documentation

## 2023-12-10 DIAGNOSIS — I480 Paroxysmal atrial fibrillation: Secondary | ICD-10-CM | POA: Diagnosis not present

## 2023-12-10 DIAGNOSIS — I5022 Chronic systolic (congestive) heart failure: Secondary | ICD-10-CM | POA: Insufficient documentation

## 2023-12-10 DIAGNOSIS — I4892 Unspecified atrial flutter: Secondary | ICD-10-CM | POA: Insufficient documentation

## 2023-12-10 DIAGNOSIS — E669 Obesity, unspecified: Secondary | ICD-10-CM | POA: Insufficient documentation

## 2023-12-10 DIAGNOSIS — Z7901 Long term (current) use of anticoagulants: Secondary | ICD-10-CM | POA: Diagnosis not present

## 2023-12-10 DIAGNOSIS — R0602 Shortness of breath: Secondary | ICD-10-CM | POA: Diagnosis not present

## 2023-12-10 DIAGNOSIS — I509 Heart failure, unspecified: Secondary | ICD-10-CM | POA: Diagnosis not present

## 2023-12-10 LAB — BASIC METABOLIC PANEL WITH GFR
Anion gap: 9 (ref 5–15)
BUN: 37 mg/dL — ABNORMAL HIGH (ref 8–23)
CO2: 27 mmol/L (ref 22–32)
Calcium: 8.8 mg/dL — ABNORMAL LOW (ref 8.9–10.3)
Chloride: 100 mmol/L (ref 98–111)
Creatinine, Ser: 1.52 mg/dL — ABNORMAL HIGH (ref 0.61–1.24)
GFR, Estimated: 45 mL/min — ABNORMAL LOW (ref >60)
Glucose, Bld: 124 mg/dL — ABNORMAL HIGH (ref 70–99)
Potassium: 4.3 mmol/L (ref 3.5–5.1)
Sodium: 136 mmol/L (ref 135–145)

## 2023-12-10 LAB — BRAIN NATRIURETIC PEPTIDE: B Natriuretic Peptide: 3213 pg/mL — ABNORMAL HIGH (ref 0.0–100.0)

## 2023-12-10 MED ORDER — TORSEMIDE 20 MG PO TABS: 60.0000 mg | ORAL_TABLET | Freq: Every day | ORAL | 3 refills | Status: DC

## 2023-12-10 NOTE — Patient Instructions (Addendum)
 Medication Changes:  INCREASE TORSEMIDE  TO 60MG  DAILY   Lab Work:  Labs done today, your results will be available in MyChart, we will contact you for abnormal readings.  Referrals:  YOU HAVE BEEN REFERRED TO PAR[facility]ED[facility]INE THEY WILL REACH OUT TO YOU OR CALL TO ARRANGE THIS. PLEASE CALL US  WITH ANY CONCERNS   LIMIT FLUID INTAKE TO 64 OZ DAILY   Follow-Up in: 2-3 WEEKS WITH APP AS SCHEDULED   At the [facility], you and your health needs are our priority. We have a designated team specialized in the treatment of Heart Failure. This Care Team includes your primary Heart Failure Specialized Cardiologist (physician), Advanced Practice Providers (APPs- Physician Assistants and Nurse Practitioners), and Pharmacist who all work together to provide you with the care you need, when you need it.   You may see any of the following providers on your designated Care Team at your next follow up:  Dr. Jules Oar Dr. Peder Bourdon Dr. Alwin Baars Dr. Judyth Nunnery Nieves Bars, NP Ruddy Corral, [place] [facility] [place], [place] Dennise Fitz, NP [place] Lee, NP Luster Salters, PharmD   Please be sure to bring in all your medications bottles to every appointment.   Need to Contact Us :  If you have any questions or concerns before your next appointment please send us  a message through Patrick or call our office at [phone].    TO LEAVE A MESSAGE FOR THE NURSE SELECT OPTION 2, PLEASE LEAVE A MESSAGE INCLUDING: YOUR NAME DATE OF BIRTH CALL BACK NUMBER REASON FOR CALL**this is important as we prioritize the call backs  YOU WILL RECEIVE A CALL BACK THE SAME DAY AS LONG AS YOU CALL BEFORE 4:00 PM

## 2023-12-10 NOTE — Progress Notes (Signed)
 PCP: Jimmey Mould, MD Cardiology: Dr. [place]  HF Cardiology: Dr. Mitzie Anda   HPI: Mr Bromwell is a 83 y.o. male with a history of severe AS s/p St Jude mechanical AVR in 03/2003 on chronic coumadin , HTN, obesity, and mild LV dysfxn  EF 40-45% on 01/2016 echo.  Prior to admit in 1/18, he had a cough and dyspnea that was thought to be bronchitis so he was treated with abx and oral steroids. He was seen in cardiology office in 1/18 and was noted to be in rapid atrial fibrillation with significant volume overload.   Admitted 1/23 through 08/29/16 with marked volume overload, lower extremity edema, and atrial fibrillation with RVR.  Initial cardioversion failed and he was difficult to diurese. He was placed on milrinone  for low output and diuresed with IV lasix  with good response.  Weight came down considerably. Amiodarone  was begun. Once fully diuresed, he had successful DC-CV on 08/28/16. Discharged on torsemide  60 mg daily. Discharge weight 212 pounds. Echo and TEE in 1/18 had shown fall in EF to around 25%.   He continued to lose weight as an outpatient.  He had a syncopal episode where he stood up from sitting and got lightheaded with transient loss of consciousness.  He had been having orthostatic symptoms.  We saw him in the office and cut back on torsemide  from 60 mg daily down eventually to 20 mg daily.  Creatinine up some to 1.7 at that time.    At a prior appt, he was back in atrial fibrillation.  He was set up for DCCV in 3/18, but was in NSR when he arrived to short stay. He had atrial fibrillation ablation with Dr. Lawana Pray in 5/18.   Echo was done in 5/18, EF up to 45% with mild diffuse hypokinesis, normal mechanical aortic valve, mildly decreased RV systolic function.    Echo was repeated in 5/19, EF 35-40%, diffuse hypokinesis, mildly decreased RV systolic function, stable mechanical aortic valve.   Echo in 11/21 showed EF up to 50-55%, normal RV size and systolic function, mechanical aortic  valve looks ok, normal IVC, ascending aorta 4.2 cm. Echo in 12/21 showed EF 60-65%, mild RV enlargement, normal RV function, mechanical aortic valve mean gradient 10 mmHg, ascending aorta 4.1 cm.  MRA chest in 1/22 showed 4.3 cm ascending aorta.   Echo 1/23 showed EF 35-40%, global hypokinesis, mildly decreased RV systolic function, normal mechanical aortic valve, moderate MR, PASP 51 mmHg. With EF back down and mild volume overload, he was started on Farxiga , torsemide  increased and R/LHC arranged.  R/LHC (1/23) showed mild elevated PCWP with mild pulmonary venous hypertension, preserved CO and minimal CAD.  Follow up 9/23, found to be in AFL with increased fatige and mild volume overload. Spiro increased to 25 mg daily and started on torsemide  20 mg daily. Arranged for DCCV, s/p DCCV 04/05/22 to NSR.  Admitted 3/24 with CHF and multifocal pneumonia. Diuresed with IV lasix  and received IV abx. Transitioned to higher dose of torsemide , 40 mg. He was discharged home, weight 203 lbs.  Follow up 5/24, NYHA II-III symptoms, mildly volume overloaded. Torsemide  increased and repeat echo arranged.  Follow up 6/24, he was volume overloaded 2/2 to being off torsemide  x 1 week. Torsemide  40 mg daily restarted.   Echo 01/16/23 showed EF 30-35%, RV mildly reduced, mild MR, mechanical AVR stable with mean gradient 5 mmHg  He had 2nd opinion by Dr Matilde Son . Saw Dr Diania Fortes 12/05/23. ILD. Cough improved with prednisone .  Plan to monitor before considering other options.   Today he returns for an acute work in for shortness of breath with his wife. SOB with exertion. Denies PND/Orthopnea. Gets SOB talking on the phone. Drinks lots of water . He is not sure much he weights. Appetite ok. No fever or chills. Taking all medications.   Labs (3/19): K 4.7,creatinine 0.99 Labs (5/19): K 4.6, creatinine 0.98 Labs (9/19): K 4.2, creatinine 0.91 Labs (12/19): K 4.6, creatinine 1.14 Labs (3/20): K 4.6, creatinine 0.98 Labs  (7/20): creatinine 1.07 Labs (9/21): K 5, creatinine 1.13 Labs (12/21): LDL 77, K 4.1, creatinine 1.61 Labs (6/22): K 4.8, creatinine 1.02 Labs (10/22): K 4.9, creatinine 1.02 Labs (11/22): K 4.9, creatinine 1.02 Labs (1/23): K 4.5, creatinine 1.18 Labs (2/23): K 4.8, creatinine 1.29 Labs (5/23): LDL 92 Labs (6/23): K 4.7, creatinine 1.16, BNP 521 Labs (9/23): K 4.7, creatinine 1.24 Labs (3/24): K 4.3, creatinine 1.28 Labs (4/24): K 5.2, creatinine 1.38, BNP 840 Labs (5/24): K 4.7, creatinine 1.44  PMH: 1. Aortic stenosis: # 25 St Jude mechanical aortic valve in 9/14.   2. Chronic systolic CHF: Echo in 7/17 with EF 45-50%.  Admitted in 1/18 with afib with RVR, ?tachycardia-mediated cardiomyopathy.  - Echo (1/18) with EF 25-30%, stable mechanical aortic valve.  - TEE (1/18) with EF 20-25%, stable mechanical aortic valve, moderate MR, small PFO.  - Admission 1/18 with acute on chronic systolic CHF, required milrinone , extensively diuresed.  - Echo (5/18) with EF 45%, diffuse hypokinesis, normal RV size with mildly decreased systolic function, mechanical aortic valve with mean gradient 14 mmHg, ascending aorta 4.4 cm.  - Echo (5/19) with EF 35-40%, diffuse hypokinesis, mild LVH, mildly decreased RV systolic function, mechanical aortic valve looked ok.   - Echo (11/20) with EF up to 50-55%, normal RV size and systolic function, mechanical aortic valve looks ok, normal IVC, ascending aorta 4.2 cm.  - Echo (12/21) with EF 60-65%, mild RV enlargement, normal RV function, mechanical aortic valve mean gradient 10 mmHg, ascending aorta 4.1 cm. - Echo (1/23) with EF 35-40%, global hypokinesis, mildly decreased RV systolic function, normal mechanical aortic valve, moderate MR, PASP 51 mmHg.  - L/RHC (1/23): minimal CAD, RA mean 6, PA 35/18 (25) PCWP mean 17, preserved CO/CI 4.87/2.39, PVR 1.6 WU. - Echo (6/24): EF 30-35%, RV mildly reduced, mild MR, mechanical AVR stable with mean gradient 5 mmHg 3.  Atrial fibrillation: First noted in 1/18, uncertain how long it had been present (with RVR).  08/28/2016 Successful DC-CV --> NSR.  Back in atrial fibrillation at 09/18/16 office visit.  Set up for DCCV in 3/18 but back in NSR.   - Atrial fibrillation ablation in 5/18. Now off amiodarone .  - s/p DCCV for AFL--> NSR (9/23) 4. 09/04/2016 Syncope--> over-diuresis most likely.  5. HTN 6. Ascending aortic aneurysm: 4.4 cm on 7/17 echo, 4.4 cm on 5/18 echo.  - MRA chest (4/19): 4.5 cm ascending aortic aneurysm.  - MRA chest (7/20): 4.5 cm ascending aortic aneurysm.  - MRA chest (1/22): 4.3 cm ascending aortic aneurysm (breast abnormality, recommend mammogram => consistent with benign hamartoma) - MRA chest (5/23): 4.3 cm ascending aortic aneurysm - MRA chest (4/24): 4.3 cm ascending aortic aneurysm 7. Prostate cancer: Treated with radioactive seed implantation.  8. Nephrolithiasis.  9. PFTs (4/18): Mild restriction.  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    Comment: retired/[employer]  Tobacco Use   Smoking status: Never   Smokeless tobacco: Never  Vaping Use   Vaping status: Never Used  Substance and Sexual Activity   Alcohol use: Yes    Alcohol/week: 3.0 standard drinks of alcohol    Types: 3 Cans of beer per week    Comment:  "may have 2-3 beers on the weekend"   Drug use: No   Sexual activity: Not on file  Other Topics Concern   Not on file  Social History Narrative   Not on file   Social Drivers of Health   Financial Resource Strain: Not on file  Food Insecurity: Low Risk  (11/14/2023)   Received from [facility]   Hunger Vital Sign    Worried About Running Out of Food in the Last  Year: Never true    Ran Out of Food in the Last Year: Never true  Transportation Needs: No Transportation Needs (11/14/2023)   Received from [facility]    In the past 12 months, has lack of reliable transportation kept you from medical appointments, meetings, work or from getting things needed for daily living? : No  Physical Activity: Not on file  Stress: Not on file  Social Connections: Not on file  Intimate Partner Violence: Not At Risk (10/16/2022)   Humiliation, Afraid, Rape, and Kick questionnaire    Fear of Current or Ex-Partner: No    Emotionally Abused: No    Physically Abused: No    Sexually Abused: No    FH:  Family History  Problem Relation Age of Onset   Heart failure Mother    Diabetes Mother    Prostate cancer Father     Current Outpatient Medications  Medication Sig Dispense Refill   acetaminophen  (TYLENOL ) 500 MG tablet Take 500 mg by mouth 2 (two) times daily.     allopurinol  (ZYLOPRIM ) 300 MG tablet Take 150 mg by mouth daily.     amoxicillin  (AMOXIL ) 500 MG capsule Take 2,000 mg by mouth See admin instructions. Take 2000 mg 1 hour prior to dental work     aspirin  EC 81 MG tablet Take 81 mg by mouth daily. Swallow whole.     colchicine  0.6 MG tablet Take 1 tablet (0.6 mg total) by mouth daily as needed (for gout).     dapagliflozin  propanediol (FARXIGA ) 10 MG TABS tablet Take 1 tablet (10 mg total) by mouth daily. 30 tablet 6   docusate sodium  (COLACE) 100 MG capsule Take 100 mg by mouth 2 (two) times daily.     ENTRESTO  24-26 MG TAKE 1 TABLET BY MOUTH TWICE A DAY 180 tablet 2   FIBER PO Take 2 capsules by mouth 2 (two) times daily.     folic acid  (FOLVITE ) 1 MG tablet TAKE 1 TABLET(1 MG) BY MOUTH DAILY 90 tablet 3   metoprolol  succinate (TOPROL -XL) 25 MG 24 hr tablet TAKE 1 TABLET BY MOUTH EVERYDAY AT BEDTIME 90 tablet 1   polyethylene glycol powder (GLYCOLAX /MIRALAX ) 17 GM/SCOOP powder Take 17 g by mouth 2 (two) times daily as needed for  moderate constipation or mild constipation. 238 g 2   potassium chloride  SA (KLOR-CON  M) 20 MEQ tablet Take 1 tablet (20 mEq total) by mouth  daily. 90 tablet 3   tamsulosin  (FLOMAX ) 0.4 MG CAPS capsule Take 1 capsule (0.4 mg total) by mouth every other day. 90 capsule 3   tolterodine  (DETROL  LA) 4 MG 24 hr capsule Take 1 capsule (4 mg total) by mouth every other day. Alternating days with the tamsulosin  90 capsule 3   torsemide  (DEMADEX ) 20 MG tablet Take 40 mg daily alternating with 20 mg daily 150 tablet 11   warfarin (COUMADIN ) 5 MG tablet Take 0.5-1 tablets (2.5-5 mg total) by mouth See admin instructions. 1/19 and 1/20  take 7.5mg  (1 and 1/2 tab) then take 1 tab (5mg ) daily until seen by MD  Take 1 tablet (5 mg) by mouth on Sundays, Mondays, Wednesdays, Thursdays & Saturdays in the evening. Take 0.5 tablet (2.5 mg) by mouth on Tuesdays & Fridays in the evening. (Patient taking differently: Take 2.5-5 mg by mouth See admin instructions. Take 2.5 mg every evening with supper on Monday, Friday. 5 mg with supper all  other days.) 45 tablet 11   No current facility-administered medications for this encounter.   BP 96/74   Pulse (!) 57   Wt 90.1 kg (198 lb 9.6 oz)   SpO2 96%   BMI 28.50 kg/m    Wt Readings from Last 3 Encounters:  12/10/23 90.1 kg (198 lb 9.6 oz)  12/05/23 86.4 kg (190 lb 6.4 oz)  10/16/23 86.5 kg (190 lb 9.6 oz)   PHYSICAL EXAM: General:  Walked slowly in the clinic.  No resp difficulty Neck: supple. JVP  Cor: PMI nondisplaced. Regular rate & rhythm. No rubs, gallops or murmurs. Lungs: clear Abdomen: soft, nontender, nondistended.  Extremities: no cyanosis, clubbing, rash, R and LLE 2+ edema Neuro: alert & oriented x3   ASSESSMENT & PLAN: 1. Chronic systolic CHF: EF on TEE in 1/18 20-25%, mechanical aortic valve ok. 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  He required milrinone  to assist  diuresis during 1/18-2/18 admission.  Now that he is back in NSR s/p atrial fibrillation ablation, echo in 5/18 showed EF back up to 45%.  Echo in 5/19 showed mildly lower systolic function, EF 35-40%. Echo in 12/21 showed EF up to 60-65%.  However, echo 1/23 showed EF back down to 35-40% with mild RV dysfunction.  Coronary angiography in 1/23 showed nonobstructive mild CAD. Echo 6/24 showed EF 30-35%, RV mildly reduced.  NYHA III ReDs reading: 43 %, abnormal. Volume status elevated. Increase torsemide  to 60 mg daily. - Continue Entresto  24/26 mg bid.      - Continue Toprol  XL 25 mg qhs.   - Continue Farxiga  10 mg daily.  - Continue spironolactone  25 mg daily.  - EF remains mildly lower. Likely not ICD candidate with advanced age. -Check BMET Instructed to limit fluid intake < 2 liters.  2. Mechanical aortic valve: Continue warfarin and ASA 81. INR followed by PCP. Valve looked ok most recent echo.  - He will need antibiotic prophylaxis with dental work.  3. Atrial fibrillation/atrial flutter: Admitted 1/18 with atrial fibrillation with RVR and CHF.  EF down to 20-25%.  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  Need to keep him in NSR. DCCV during 2/18 admission and now s/p atrial fibrillation ablation.  In AFL at 9/23 follow up, DCCV to NSR.  In SR with PACS.  -Continue Toprol  XL 25 mg daily - Off amiodarone .   -  Continue warfarin.  4. Ascending aortic aneurysm: 4.3 cm on 4/24 MRA chest.   Follow up 2-3 weeks to reassess volume status.   Misheel Gowans NP-C  12/10/2023

## 2023-12-10 NOTE — Progress Notes (Signed)
 ReDS Vest / Clip - 12/10/23 1500       ReDS Vest / Clip   Station Marker D    Ruler Value 32    ReDS Value Range High volume overload    ReDS Actual Value 43

## 2023-12-11 ENCOUNTER — Ambulatory Visit: Admitting: Pulmonary Disease

## 2023-12-11 DIAGNOSIS — J849 Interstitial pulmonary disease, unspecified: Secondary | ICD-10-CM | POA: Diagnosis not present

## 2023-12-11 LAB — PULMONARY FUNCTION TEST
DL/VA % pred: 98 %
DL/VA: 3.78 ml/min/mmHg/L
DLCO unc % pred: 50 %
DLCO unc: 12.28 ml/min/mmHg
FEF 25-75 Post: 2.77 L/s
FEF 25-75 Pre: 2.01 L/s
FEF2575-%Change-Post: 37 %
FEF2575-%Pred-Post: 147 %
FEF2575-%Pred-Pre: 107 %
FEV1-%Change-Post: 4 %
FEV1-%Pred-Post: 58 %
FEV1-%Pred-Pre: 55 %
FEV1-Post: 1.63 L
FEV1-Pre: 1.56 L
FEV1FVC-%Change-Post: 1 %
FEV1FVC-%Pred-Pre: 116 %
FEV6-%Change-Post: 2 %
FEV6-%Pred-Post: 52 %
FEV6-%Pred-Pre: 51 %
FEV6-Post: 1.93 L
FEV6-Pre: 1.88 L
FEV6FVC-%Pred-Post: 107 %
FEV6FVC-%Pred-Pre: 107 %
FVC-%Change-Post: 2 %
FVC-%Pred-Post: 49 %
FVC-%Pred-Pre: 47 %
FVC-Post: 1.93 L
FVC-Pre: 1.88 L
Post FEV1/FVC ratio: 84 %
Post FEV6/FVC ratio: 100 %
Pre FEV1/FVC ratio: 83 %
Pre FEV6/FVC Ratio: 100 %
RV % pred: 66 %
RV: 1.78 L
TLC % pred: 50 %
TLC: 3.6 L

## 2023-12-11 NOTE — Patient Instructions (Signed)
 Full pft performed today.

## 2023-12-11 NOTE — Progress Notes (Signed)
 Full pft performed today.

## 2023-12-12 ENCOUNTER — Ambulatory Visit (HOSPITAL_COMMUNITY): Payer: Self-pay | Admitting: Adult Health

## 2023-12-12 ENCOUNTER — Other Ambulatory Visit (HOSPITAL_COMMUNITY): Payer: Self-pay | Admitting: Cardiology

## 2023-12-18 DIAGNOSIS — Z7901 Long term (current) use of anticoagulants: Secondary | ICD-10-CM | POA: Diagnosis not present

## 2023-12-21 ENCOUNTER — Telehealth (HOSPITAL_COMMUNITY): Payer: Self-pay | Admitting: Cardiology

## 2023-12-21 MED ORDER — TORSEMIDE 20 MG PO TABS
80.0000 mg | ORAL_TABLET | Freq: Every day | ORAL | 3 refills | Status: DC
Start: 2023-12-21 — End: 2023-12-26

## 2023-12-21 MED ORDER — POTASSIUM CHLORIDE CRYS ER 20 MEQ PO TBCR: EXTENDED_RELEASE_TABLET | ORAL | 3 refills | Status: DC

## 2023-12-21 MED ORDER — METOLAZONE 2.5 MG PO TABS: 2.5000 mg | ORAL_TABLET | Freq: Every day | ORAL | 0 refills | Status: DC

## 2023-12-21 NOTE — Telephone Encounter (Signed)
 Patient called to report increase in SOB, swelling, and weight gain  Reports with recent med changes has only seen minimal weight decrease however weight is back up  Weight today 197 Weight few days ago 193  Currently taking torsemide  60 daily Requested sooner follow up-add on given 6/3  Will forward to provider for additional orders

## 2023-12-21 NOTE — Telephone Encounter (Signed)
 Patient unable to return for furoscix  samples   Alternative plan from [place], [place] Increase Torsemide  to 80 mg daily Metolazone  2.5 x 2 doses with additional 40 meq of potassium Keep follow up as scheduled 6/3   Pt and pts son aware and voiced understanding

## 2023-12-24 ENCOUNTER — Telehealth: Payer: Self-pay | Admitting: Pulmonary Disease

## 2023-12-24 ENCOUNTER — Telehealth (HOSPITAL_COMMUNITY): Payer: Self-pay | Admitting: *Deleted

## 2023-12-24 NOTE — Telephone Encounter (Signed)
 [facility] reached out regarding patient with concerns about picking up equipment but was unable to locate notes in patient chart. We did see a recent order for Pulse Oximetry. Could you all please advise on this order and delivery method for patient.

## 2023-12-24 NOTE — Telephone Encounter (Signed)
 Called to confirm/remind patient of their appointment at the [facility] on        Appointment:              [x] Confirmed             [] Left mess              [] No answer/No voice mail             [] Phone not in service   Patient reminded to bring all medications and/or complete list.   Confirmed patient has transportation. Gave directions, instructed to utilize valet parking.

## 2023-12-25 ENCOUNTER — Encounter (HOSPITAL_COMMUNITY): Payer: Self-pay

## 2023-12-25 ENCOUNTER — Ambulatory Visit (HOSPITAL_COMMUNITY): Payer: Self-pay | Admitting: Adult Health

## 2023-12-25 ENCOUNTER — Encounter (HOSPITAL_COMMUNITY)

## 2023-12-25 ENCOUNTER — Telehealth (HOSPITAL_COMMUNITY): Payer: Self-pay | Admitting: Emergency Medicine

## 2023-12-25 ENCOUNTER — Ambulatory Visit (HOSPITAL_COMMUNITY)
Admission: RE | Admit: 2023-12-25 | Discharge: 2023-12-25 | Disposition: A | Discharge status: Home / Self Care | Source: Ambulatory Visit | Attending: Adult Health | Admitting: Adult Health

## 2023-12-25 VITALS — BP 82/60 | HR 101 | Ht 70.0 in | Wt 181.6 lb

## 2023-12-25 DIAGNOSIS — I5022 Chronic systolic (congestive) heart failure: Secondary | ICD-10-CM

## 2023-12-25 DIAGNOSIS — Z7901 Long term (current) use of anticoagulants: Secondary | ICD-10-CM | POA: Diagnosis not present

## 2023-12-25 DIAGNOSIS — I4891 Unspecified atrial fibrillation: Secondary | ICD-10-CM | POA: Diagnosis not present

## 2023-12-25 DIAGNOSIS — I110 Hypertensive heart disease with heart failure: Secondary | ICD-10-CM | POA: Diagnosis not present

## 2023-12-25 DIAGNOSIS — I7121 Aneurysm of the ascending aorta, without rupture: Secondary | ICD-10-CM | POA: Diagnosis not present

## 2023-12-25 DIAGNOSIS — I480 Paroxysmal atrial fibrillation: Secondary | ICD-10-CM

## 2023-12-25 DIAGNOSIS — Z79899 Other long term (current) drug therapy: Secondary | ICD-10-CM | POA: Diagnosis not present

## 2023-12-25 DIAGNOSIS — I2510 Atherosclerotic heart disease of native coronary artery without angina pectoris: Secondary | ICD-10-CM | POA: Diagnosis not present

## 2023-12-25 DIAGNOSIS — Z8249 Family history of ischemic heart disease and other diseases of the circulatory system: Secondary | ICD-10-CM | POA: Insufficient documentation

## 2023-12-25 DIAGNOSIS — R058 Other specified cough: Secondary | ICD-10-CM

## 2023-12-25 DIAGNOSIS — R0609 Other forms of dyspnea: Secondary | ICD-10-CM | POA: Diagnosis not present

## 2023-12-25 DIAGNOSIS — I4892 Unspecified atrial flutter: Secondary | ICD-10-CM | POA: Insufficient documentation

## 2023-12-25 DIAGNOSIS — Z952 Presence of prosthetic heart valve: Secondary | ICD-10-CM | POA: Insufficient documentation

## 2023-12-25 DIAGNOSIS — J849 Interstitial pulmonary disease, unspecified: Secondary | ICD-10-CM | POA: Diagnosis not present

## 2023-12-25 LAB — BASIC METABOLIC PANEL WITH GFR
Anion gap: 14 (ref 5–15)
BUN: 62 mg/dL — ABNORMAL HIGH (ref 8–23)
CO2: 33 mmol/L — ABNORMAL HIGH (ref 22–32)
Calcium: 9.5 mg/dL (ref 8.9–10.3)
Chloride: 91 mmol/L — ABNORMAL LOW (ref 98–111)
Creatinine, Ser: 1.94 mg/dL — ABNORMAL HIGH (ref 0.61–1.24)
GFR, Estimated: 34 mL/min — ABNORMAL LOW (ref >60)
Glucose, Bld: 103 mg/dL — ABNORMAL HIGH (ref 70–99)
Potassium: 4 mmol/L (ref 3.5–5.1)
Sodium: 138 mmol/L (ref 135–145)

## 2023-12-25 LAB — BRAIN NATRIURETIC PEPTIDE: B Natriuretic Peptide: 2855.5 pg/mL — ABNORMAL HIGH (ref 0.0–100.0)

## 2023-12-25 NOTE — Telephone Encounter (Signed)
 Called and spoke briefly w/ Mr. Sawchuk to make a brief introduction to paramedicine program.  He was having a little difficulty hearing me on the phone.  I was able to speak to his wife and introduce myself and scheduled a home visit for tomorrow 12/26/23 @ 11:45.     Ermalinda Hays, EMT-Paramedic [phone] 12/25/2023

## 2023-12-25 NOTE — Progress Notes (Signed)
 ReDS Vest / Clip - 12/25/23 0919       ReDS Vest / Clip   Station Marker D    Ruler Value 32    ReDS Value Range Low volume    ReDS Actual Value 36

## 2023-12-25 NOTE — Patient Instructions (Signed)
 Great to see you today!!!  HOLD Entresto  tonight and tomorrow   HOLD Torsemide  and Potassium tomorrow (Wed 6/4)  Labs done today, your results will be available in MyChart, we will contact you for abnormal readings.  Keep follow-up appointment as scheduled 01/07/24  Do the following things EVERYDAY: Weigh yourself in the morning before breakfast. Write it down and keep it in a log. Take your medicines as prescribed Eat low salt foods--Limit salt (sodium) to 2000 mg per day.  Stay as active as you can everyday Limit all fluids for the day to less than 2 liters  If you have any questions or concerns before your next appointment please send us  a message through [place] or call our office at [phone].    TO LEAVE A MESSAGE FOR THE NURSE SELECT OPTION 2, PLEASE LEAVE A MESSAGE INCLUDING: YOUR NAME DATE OF BIRTH CALL BACK NUMBER REASON FOR CALL**this is important as we prioritize the call backs  YOU WILL RECEIVE A CALL BACK THE SAME DAY AS LONG AS YOU CALL BEFORE 4:00 PM  At the [facility], you and your health needs are our priority. As part of our continuing mission to provide you with exceptional heart care, we have created designated Provider Care Teams. These Care Teams include your primary Cardiologist (physician) and Advanced Practice Providers (APPs- Physician Assistants and Nurse Practitioners) who all work together to provide you with the care you need, when you need it.   You may see any of the following providers on your designated Care Team at your next follow up: Dr Jules Oar Dr Peder Bourdon Dr. Alwin Baars Dr. Arta Lark Amy Marijane Shoulders, NP Ruddy Corral, [place] [facility] [place], [place] Dennise Fitz, NP [place] Lee, NP Shawnee Dellen, NP Luster Salters, PharmD Bevely Brush, PharmD   Please be sure to bring in all your medications bottles to every appointment.    Thank you for choosing [facility]

## 2023-12-25 NOTE — Telephone Encounter (Signed)
 Adrian Lambert; Adrian Lambert, Adrian Lambert; Adrian Lambert, Adrian Lambert It is scheduled to be picked up 12/27/2023 at 10am

## 2023-12-25 NOTE — Progress Notes (Signed)
 PCP: Jimmey Mould, MD Cardiology: Dr. [place]  HF Cardiology: Dr. Mitzie Anda   HPI: [name] is a 83 y.o. male with a history of severe AS s/p St Jude mechanical AVR in 03/2003 on chronic coumadin , HTN, obesity, and mild LV dysfxn  EF 40-45% on 01/2016 echo.  Prior to admit in 1/18, he had a cough and dyspnea that was thought to be bronchitis so he was treated with abx and oral steroids. He was seen in cardiology office in 1/18 and was noted to be in rapid atrial fibrillation with significant volume overload.   Admitted 1/23 through 08/29/16 with marked volume overload, lower extremity edema, and atrial fibrillation with RVR.  Initial cardioversion failed and he was difficult to diurese. He was placed on milrinone  for low output and diuresed with IV lasix  with good response.  Weight came down considerably. Amiodarone  was begun. Once fully diuresed, he had successful DC-CV on 08/28/16. Discharged on torsemide  60 mg daily. Discharge weight 212 pounds. Echo and TEE in 1/18 had shown fall in EF to around 25%.   He continued to lose weight as an outpatient.  He had a syncopal episode where he stood up from sitting and got lightheaded with transient loss of consciousness.  He had been having orthostatic symptoms.  We saw him in the office and cut back on torsemide  from 60 mg daily down eventually to 20 mg daily.  Creatinine up some to 1.7 at that time.    At a prior appt, he was back in atrial fibrillation.  He was set up for DCCV in 3/18, but was in NSR when he arrived to short stay. He had atrial fibrillation ablation with Dr. Lawana Pray in 5/18.   Echo was done in 5/18, EF up to 45% with mild diffuse hypokinesis, normal mechanical aortic valve, mildly decreased RV systolic function.    Echo was repeated in 5/19, EF 35-40%, diffuse hypokinesis, mildly decreased RV systolic function, stable mechanical aortic valve.   Echo in 11/21 showed EF up to 50-55%, normal RV size and systolic function, mechanical aortic  valve looks ok, normal IVC, ascending aorta 4.2 cm. Echo in 12/21 showed EF 60-65%, mild RV enlargement, normal RV function, mechanical aortic valve mean gradient 10 mmHg, ascending aorta 4.1 cm.  MRA chest in 1/22 showed 4.3 cm ascending aorta.   Echo 1/23 showed EF 35-40%, global hypokinesis, mildly decreased RV systolic function, normal mechanical aortic valve, moderate MR, PASP 51 mmHg. With EF back down and mild volume overload, he was started on Farxiga , torsemide  increased and R/LHC arranged.  R/LHC (1/23) showed mild elevated PCWP with mild pulmonary venous hypertension, preserved CO and minimal CAD.  Follow up 9/23, found to be in AFL with increased fatige and mild volume overload. Spiro increased to 25 mg daily and started on torsemide  20 mg daily. Arranged for DCCV, s/p DCCV 04/05/22 to NSR.  Admitted 3/24 with CHF and multifocal pneumonia. Diuresed with IV lasix  and received IV abx. Transitioned to higher dose of torsemide , 40 mg. He was discharged home, weight 203 lbs.  Follow up 5/24, NYHA II-III symptoms, mildly volume overloaded. Torsemide  increased and repeat echo arranged.  Follow up 6/24, he was volume overloaded 2/2 to being off torsemide  x 1 week. Torsemide  40 mg daily restarted.   Echo 01/16/23 showed EF 30-35%, RV mildly reduced, mild MR, mechanical AVR stable with mean gradient 5 mmHg  He had 2nd opinion by Dr Matilde Son . Saw Dr Diania Fortes 12/05/23. ILD. Cough improved with prednisone .  Plan to monitor before considering other options.   He was seen  in the HF clinic a few weeks ago. Volume status elevated. Torsemide  was increased to 60 mg daily but did not have much diuresis. Last week he was instructed to increase torsemide  80 mg daily and to take metolazone  for 2 days. Reported brisk diuresis.  Today he returns for HF follow up.Overall feeling fine.  Cough has almost gone away. Mild dyspnea with exertion. Denies PND/Orthopnea. Appetite ok. No fever or chills. Weight at home has gone  down 197-->167 pounds. SBP in 80s. Taking all medications.   PMH: 1. Aortic stenosis: # 25 St Jude mechanical aortic valve in 9/14.   2. Chronic systolic CHF: Echo in 7/17 with EF 45-50%.  Admitted in 1/18 with afib with RVR, ?tachycardia-mediated cardiomyopathy.  - Echo (1/18) with EF 25-30%, stable mechanical aortic valve.  - TEE (1/18) with EF 20-25%, stable mechanical aortic valve, moderate MR, small PFO.  - Admission 1/18 with acute on chronic systolic CHF, required milrinone , extensively diuresed.  - Echo (5/18) with EF 45%, diffuse hypokinesis, normal RV size with mildly decreased systolic function, mechanical aortic valve with mean gradient 14 mmHg, ascending aorta 4.4 cm.  - Echo (5/19) with EF 35-40%, diffuse hypokinesis, mild LVH, mildly decreased RV systolic function, mechanical aortic valve looked ok.   - Echo (11/20) with EF up to 50-55%, normal RV size and systolic function, mechanical aortic valve looks ok, normal IVC, ascending aorta 4.2 cm.  - Echo (12/21) with EF 60-65%, mild RV enlargement, normal RV function, mechanical aortic valve mean gradient 10 mmHg, ascending aorta 4.1 cm. - Echo (1/23) with EF 35-40%, global hypokinesis, mildly decreased RV systolic function, normal mechanical aortic valve, moderate MR, PASP 51 mmHg.  - L/RHC (1/23): minimal CAD, RA mean 6, PA 35/18 (25) PCWP mean 17, preserved CO/CI 4.87/2.39, PVR 1.6 WU. - Echo (6/24): EF 30-35%, RV mildly reduced, mild MR, mechanical AVR stable with mean gradient 5 mmHg 3. Atrial fibrillation: First noted in 1/18, uncertain how long it had been present (with RVR).  08/28/2016 Successful DC-CV --> NSR.  Back in atrial fibrillation at 09/18/16 office visit.  Set up for DCCV in 3/18 but back in NSR.   - Atrial fibrillation ablation in 5/18. Now off amiodarone .  - s/p DCCV for AFL--> NSR (9/23) 4. 09/04/2016 Syncope--> over-diuresis most likely.  5. HTN 6. Ascending aortic aneurysm: 4.4 cm on 7/17 echo, 4.4 cm on 5/18 echo.   - MRA chest (4/19): 4.5 cm ascending aortic aneurysm.  - MRA chest (7/20): 4.5 cm ascending aortic aneurysm.  - MRA chest (1/22): 4.3 cm ascending aortic aneurysm (breast abnormality, recommend mammogram => consistent with benign hamartoma) - MRA chest (5/23): 4.3 cm ascending aortic aneurysm - MRA chest (4/24): 4.3 cm ascending aortic aneurysm 7. Prostate cancer: Treated with radioactive seed implantation.  8. Nephrolithiasis.  9. PFTs (4/18): Mild restriction.  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/[employer]  Tobacco Use   Smoking status: Never   Smokeless tobacco: Never  Vaping Use   Vaping status: Never Used  Substance and Sexual Activity  Alcohol use: Yes    Alcohol/week: 3.0 standard drinks of alcohol    Types: 3 Cans of beer per week    Comment:  "may have 2-3 beers on the weekend"   Drug use: No   Sexual activity: Not on file  Other Topics Concern   Not on file  Social History Narrative   Not on file   Social Drivers of Health   Financial Resource Strain: Not on file  Food Insecurity: Low Risk  (11/14/2023)   Received from [facility]   Hunger Vital Sign    Worried About Running Out of Food in the Last Year: Never true    Ran Out of Food in the Last Year: Never true  Transportation Needs: No Transportation Needs (11/14/2023)   Received from [employer]    In the past 12 months, has lack of reliable transportation kept you from medical appointments, meetings, work or from getting things needed for daily living? : No  Physical Activity: Not on file  Stress: Not on file  Social Connections: Not on file  Intimate Partner  Violence: Not At Risk (10/16/2022)   Humiliation, Afraid, Rape, and Kick questionnaire    Fear of Current or Ex-Partner: No    Emotionally Abused: No    Physically Abused: No    Sexually Abused: No    FH:  Family History  Problem Relation Age of Onset   Heart failure Mother    Diabetes Mother    Prostate cancer Father     Current Outpatient Medications  Medication Sig Dispense Refill   acetaminophen  (TYLENOL ) 500 MG tablet Take 500 mg by mouth 2 (two) times daily.     allopurinol  (ZYLOPRIM ) 300 MG tablet Take 150 mg by mouth daily.     amoxicillin  (AMOXIL ) 500 MG capsule Take 2,000 mg by mouth See admin instructions. Take 2000 mg 1 hour prior to dental work     aspirin  EC 81 MG tablet Take 81 mg by mouth daily. Swallow whole.     colchicine  0.6 MG tablet Take 1 tablet (0.6 mg total) by mouth daily as needed (for gout).     docusate sodium  (COLACE) 100 MG capsule Take 100 mg by mouth 2 (two) times daily.     ENTRESTO  24-26 MG TAKE 1 TABLET BY MOUTH TWICE A DAY 180 tablet 2   FARXIGA  10 MG TABS tablet TAKE 1 TABLET BY MOUTH EVERY DAY 30 tablet 6   FIBER PO Take 2 capsules by mouth 2 (two) times daily.     folic acid  (FOLVITE ) 1 MG tablet TAKE 1 TABLET(1 MG) BY MOUTH DAILY 90 tablet 3   metoprolol  succinate (TOPROL -XL) 25 MG 24 hr tablet TAKE 1 TABLET BY MOUTH EVERYDAY AT BEDTIME 90 tablet 1   polyethylene glycol powder (GLYCOLAX /MIRALAX ) 17 GM/SCOOP powder Take 17 g by mouth 2 (two) times daily as needed for moderate constipation or mild constipation. 238 g 2   potassium chloride  SA (KLOR-CON  M) 20 MEQ tablet Take 3 tablets (60 mEq total) by mouth daily for 2 days, THEN 1 tablet (20 mEq total) daily. 90 tablet 3   tamsulosin  (FLOMAX ) 0.4 MG CAPS capsule Take 1 capsule (0.4 mg total) by mouth every other day. 90 capsule 3   tolterodine  (DETROL  LA) 4 MG 24 hr capsule Take 1 capsule (4 mg total) by mouth every other day. Alternating days with the tamsulosin  90 capsule 3   torsemide   (DEMADEX ) 20 MG tablet Take 4 tablets (80 mg  total) by mouth daily. 120 tablet 3   warfarin (COUMADIN ) 5 MG tablet Take 0.5-1 tablets (2.5-5 mg total) by mouth See admin instructions. 1/19 and 1/20  take 7.5mg  (1 and 1/2 tab) then take 1 tab (5mg ) daily until seen by MD  Take 1 tablet (5 mg) by mouth on Sundays, Mondays, Wednesdays, Thursdays & Saturdays in the evening. Take 0.5 tablet (2.5 mg) by mouth on Tuesdays & Fridays in the evening. (Patient taking differently: Take 2.5-5 mg by mouth See admin instructions. Take 2.5 mg every evening with supper on Monday, Friday. 5 mg with supper all  other days.) 45 tablet 11   metolazone  (ZAROXOLYN ) 2.5 MG tablet Take 1 tablet (2.5 mg total) by mouth daily for 2 days. 2 tablet 0   No current facility-administered medications for this encounter.   BP (!) 82/60   Pulse (!) 101   Ht 5\' 10"  (1.778 m)   Wt 82.4 kg (181 lb 9.6 oz)   SpO2 98%   BMI 26.06 kg/m    Wt Readings from Last 3 Encounters:  12/25/23 82.4 kg (181 lb 9.6 oz)  12/10/23 90.1 kg (198 lb 9.6 oz)  12/05/23 86.4 kg (190 lb 6.4 oz)   PHYSICAL EXAM: General:   No resp difficulty Neck: supple. no JVD.  Cor: PMI nondisplaced. Regular rate & rhythm. No rubs, gallops or murmurs. Lungs: clear Abdomen: soft, nontender, nondistended.  Extremities: no cyanosis, clubbing, rash, trace edema Neuro: alert & oriented x3   ASSESSMENT & PLAN: 1. Chronic systolic CHF: EF on TEE in 1/18 20-25%, mechanical aortic valve ok. 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  He required milrinone  to assist diuresis during 1/18-2/18 admission.  Now that he is back in NSR s/p atrial fibrillation ablation, echo in 5/18 showed EF back up to 45%.  Echo in 5/19 showed mildly lower systolic function, EF 35-40%. Echo in 12/21 showed EF up to 60-65%.  However, echo 1/23 showed EF back down to 35-40% with mild RV dysfunction.  Coronary  angiography in 1/23 showed nonobstructive mild CAD. Echo 6/24 showed EF 30-35%, RV mildly reduced.  NYHA III but functionally better.  ReDs reading: 36 %, normal Volume status looks much better but I am concerned he may be a little dry. Brisk diuresis with metolazone . Cough resolving with diuresis. Tomorrow hold torsemide  and potassium.  -Hold entresto  tonight and tomorrow.  - Continue Toprol  XL 25 mg qhs.   - Continue Farxiga  10 mg daily.  - Continue spironolactone  25 mg daily.  - EF remains mildly lower. Likely not ICD candidate with advanced age.  - Check BMET and BNP today.  2. Mechanical aortic valve: Continue warfarin and ASA 81. INR followed by PCP. Valve looked ok most recent echo.  - He will need antibiotic prophylaxis with dental work.  3. Atrial fibrillation/atrial flutter: Admitted 1/18 with atrial fibrillation with RVR and CHF.  EF down to 20-25%.  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  Need to keep him in NSR. DCCV during 2/18 admission and now s/p atrial fibrillation ablation.  In AFL at 9/23 follow up, DCCV to NSR. Regular on exam.  -Continue Toprol  XL 25 mg daily - Off amiodarone .   - Continue warfarin.  4. Ascending aortic aneurysm: 4.3 cm on 4/24 MRA chest.     Follow up in 2 weeks to reassess. I will call  him later with lab results.    Timara Loma NP-C  12/25/2023

## 2023-12-26 ENCOUNTER — Other Ambulatory Visit (HOSPITAL_COMMUNITY): Payer: Self-pay | Admitting: Emergency Medicine

## 2023-12-26 ENCOUNTER — Telehealth (HOSPITAL_COMMUNITY): Payer: Self-pay | Admitting: *Deleted

## 2023-12-26 ENCOUNTER — Telehealth: Payer: Self-pay

## 2023-12-26 MED ORDER — POTASSIUM CHLORIDE CRYS ER 20 MEQ PO TBCR: 20.0000 meq | EXTENDED_RELEASE_TABLET | Freq: Every day | ORAL | Status: DC

## 2023-12-26 MED ORDER — TORSEMIDE 20 MG PO TABS: 60.0000 mg | ORAL_TABLET | Freq: Every day | ORAL | Status: DC

## 2023-12-26 NOTE — Addendum Note (Signed)
 Addended by: Glorietta Lark on: 12/26/2023 11:52 AM   Modules accepted: Orders

## 2023-12-26 NOTE — Telephone Encounter (Signed)
 Dede called to report pt's BP is 75/40, he denies dizziness/lightheadedness, yesterday in office it was 82/60, meds were held (entresto /torsemide ).  Discussed with Nieves Bars, NP she would like pt to stop Entresto  for now, Dede and pt's wife both aware, agreeable, and verbalized understanding, Dede will see pt again later this week or early next week, pt and wife aware if he becomes symptomatic to call Dede or our office.

## 2023-12-26 NOTE — Telephone Encounter (Signed)
 Copied from CRM [id number]. Topic: Clinical - Medical Advice >> Dec 21, 2023  3:05 PM Juliana Ocean wrote: Reason for CRM: daughter Jenette Mitchell calling.  She says she thinks she missed a call from Dr Diania Fortes. A short time later, the son Veryl Gottron and wife called.  They could not remember where they were to pick up some equipment.   Veryl Gottron states they will call back Monday, since everyone has gone for today.  But Jenette Mitchell asked if she could get a call back please.  Jenette Mitchell # [phone]     Spoke w/ PT daughter not seeing anything about equipment unless talking about ONO Pt daughter unsure all other referrals have been done. Pt daughter sorry missed Dr.Dewald call if he could please try and give a call back   Jenette Mitchell # [phone]

## 2023-12-26 NOTE — Telephone Encounter (Addendum)
 Pt aware, agreeable, and verbalized understanding   ----- Message from Nieves Bars sent at 12/25/2023  2:51 PM EDT ----- Renal function worse today. He was instructed to hold  torsemide  and entresto  6/3 and 6/4.   Please call and instruct to start torsemide  60 mg daily. Needs BMEt has follow up.

## 2023-12-26 NOTE — Telephone Encounter (Signed)
 Dede with paramedicine is out seeing pt and called to clarify Torsemide  dosing as pt was instructed at [facility] to hold Tor today but then received call to start at 60 mg Daily, need to clarify if supposed to start today 6/4 or tomorrow 6/5  Per Nieves Bars, NP pt should hold entresto , torsemide , and kcl today, Wed 6/4, on Thur 6/5 RESTART: Entresto  24/26 mg BID, Torsemide  60 mg (3 tabs) Daily, and KCL 20 meq (1 tab) Daily  Dede and pt's wife both aware, agreeable, and verbalized understanding, med list updated.

## 2023-12-26 NOTE — Progress Notes (Signed)
 Paramedicine Encounter    Patient ID: Adrian Lambert, male    DOB: 11-Jul-1941, 83 y.o.   MRN: [id number]   Complaints NONE  Assessment A&O x 4, skin W&D w/ good color.  Denies chest pain or SOB.  Pt w/ mild swelling to lower extremities.  Legs firm but no pitting.  Compliance with meds YES  Pill box filled Filled by wife and pt together  Refills needed NONE  Meds changes since last visit Advised to hold Torsemide  and Potassium 6/4 and restart 60mg . Torsemide  and K on 6/5.   Social changes NONE  On arrival for initial paramedicine visit today, pt's wife had just got off the phone w/ [facility] regarding med changes from yesterday's office visit.  The orders she received conflicted w/ what I had reviewed in pt's chart.  Reached out to [employer] to clarify.  Advised to  hold Torsemide  and Pot 6/4.  Restart Torsemide  60mg  daily on 6/5.  Discontinue Entresto  per Amy.  Reviewed this w/ pt and his wife and they verbalize they understand same.  Pt's BP was lower again today 76/40.  Pt denies weakness of dizziness.  He was ambulating slowly but appeared steady when I arrived.  Advised pt to reach out to myself, the clinic should he have concerns or needs and by all means call 911 should he have and emergency.  Next home visit scheduled for 6/10 @ 9:15.  I will go back out for follow up on Thurs or Friday if needed.  BP (!) 76/40 (BP Location: Left Arm, Patient Position: Sitting, Cuff Size: Normal)   Pulse 86   Resp 16   Wt 177 lb 12.8 oz (80.6 kg)   SpO2 94%   BMI 25.51 kg/m  Weight yesterday-176.9 Last visit weight-181lb 6/3 @ [facility] Clinic  ACTION: Home visit completed  Carlton Chick [phone] 12/26/23  Patient Care Team: Jimmey Mould, MD as PCP - General (Family Medicine) Darlis Eisenmenger, MD as PCP - Cardiology (Cardiology)  Patient Active Problem List   Diagnosis Date Noted   Sepsis (HCC) 10/17/2022   BPH (benign prostatic hyperplasia) 10/17/2022    Multifocal pneumonia 10/16/2022   Chronic systolic heart failure (HCC) 01/02/2022   AF (atrial fibrillation) (HCC) 11/28/2016   Atrial fibrillation (HCC) 08/16/2016   Acute on chronic combined systolic and diastolic CHF, NYHA class 3 (HCC) 08/15/2016   Prostate cancer (HCC) 11/26/2012   Cellulitis 06/26/2012   S/P AVR (aortic valve replacement) 06/28/2011   History of aortic stenosis 06/28/2011   Bifascicular block 06/28/2011   Essential hypertension 11/04/2007   RHINITIS 11/04/2007   GERD 11/04/2007   COUGH 11/04/2007    Current Outpatient Medications:    acetaminophen  (TYLENOL ) 500 MG tablet, Take 500 mg by mouth 2 (two) times daily., Disp: , Rfl:    allopurinol  (ZYLOPRIM ) 300 MG tablet, Take 150 mg by mouth daily., Disp: , Rfl:    aspirin  EC 81 MG tablet, Take 81 mg by mouth daily. Swallow whole., Disp: , Rfl:    colchicine  0.6 MG tablet, Take 1 tablet (0.6 mg total) by mouth daily as needed (for gout)., Disp: , Rfl:    docusate sodium  (COLACE) 100 MG capsule, Take 100 mg by mouth 2 (two) times daily., Disp: , Rfl:    ENTRESTO  24-26 MG, TAKE 1 TABLET BY MOUTH TWICE A DAY, Disp: 180 tablet, Rfl: 2   FARXIGA  10 MG TABS tablet, TAKE 1 TABLET BY MOUTH EVERY DAY, Disp: 30 tablet, Rfl: 6  FIBER PO, Take 2 capsules by mouth 2 (two) times daily., Disp: , Rfl:    folic acid  (FOLVITE ) 1 MG tablet, TAKE 1 TABLET(1 MG) BY MOUTH DAILY, Disp: 90 tablet, Rfl: 3   metoprolol  succinate (TOPROL -XL) 25 MG 24 hr tablet, TAKE 1 TABLET BY MOUTH EVERYDAY AT BEDTIME, Disp: 90 tablet, Rfl: 1   [START ON 12/27/2023] potassium chloride  SA (KLOR-CON  M) 20 MEQ tablet, Take 1 tablet (20 mEq total) by mouth daily., Disp: , Rfl:    tamsulosin  (FLOMAX ) 0.4 MG CAPS capsule, Take 1 capsule (0.4 mg total) by mouth every other day., Disp: 90 capsule, Rfl: 3   tolterodine  (DETROL  LA) 4 MG 24 hr capsule, Take 1 capsule (4 mg total) by mouth every other day. Alternating days with the tamsulosin , Disp: 90 capsule, Rfl: 3    [START ON 12/27/2023] torsemide  (DEMADEX ) 20 MG tablet, Take 3 tablets (60 mg total) by mouth daily., Disp: , Rfl:    warfarin (COUMADIN ) 5 MG tablet, Take 0.5-1 tablets (2.5-5 mg total) by mouth See admin instructions. 1/19 and 1/20  take 7.5mg  (1 and 1/2 tab) then take 1 tab (5mg ) daily until seen by MD  Take 1 tablet (5 mg) by mouth on Sundays, Mondays, Wednesdays, Thursdays & Saturdays in the evening. Take 0.5 tablet (2.5 mg) by mouth on Tuesdays & Fridays in the evening. (Patient taking differently: Take 2.5-5 mg by mouth See admin instructions. Take 2.5 mg every evening with supper on Monday, Friday. 5 mg with supper all  other days.), Disp: 45 tablet, Rfl: 11   amoxicillin  (AMOXIL ) 500 MG capsule, Take 2,000 mg by mouth See admin instructions. Take 2000 mg 1 hour prior to dental work (Patient not taking: Reported on 12/26/2023), Disp: , Rfl:    metolazone  (ZAROXOLYN ) 2.5 MG tablet, Take 1 tablet (2.5 mg total) by mouth daily for 2 days., Disp: 2 tablet, Rfl: 0   polyethylene glycol powder (GLYCOLAX /MIRALAX ) 17 GM/SCOOP powder, Take 17 g by mouth 2 (two) times daily as needed for moderate constipation or mild constipation. 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  Tobacco Use   Smoking status: Never   Smokeless tobacco: Never  Vaping Use   Vaping status: Never Used  Substance and Sexual Activity   Alcohol use: Yes    Alcohol/week: 3.0 standard drinks of alcohol    Types: 3 Cans of beer per week    Comment:  "may have 2-3 beers on the weekend"   Drug use: No   Sexual activity: Not on file  Other Topics Concern   Not on file   Social History Narrative   Not on file   Social Drivers of Health   Financial Resource Strain: Not on file  Food Insecurity: Low Risk  (11/14/2023)   Received from [facility]   Hunger Vital Sign    Worried About Running Out of Food in the Last Year: Never true    Ran Out of Food in the Last Year: Never true  Transportation Needs: No Transportation Needs (11/14/2023)   Received from [facility]  Transportation    In the past 12 months, has lack of reliable transportation kept you from medical appointments, meetings, work or from getting things needed for daily living? : No  Physical Activity: Not on file  Stress: Not on file  Social Connections: Not on file  Intimate Partner Violence: Not At Risk (10/16/2022)   Humiliation, Afraid, Rape, and Kick questionnaire    Fear of Current or Ex-Partner: No    Emotionally Abused: No    Physically Abused: No    Sexually Abused: No    Physical Exam      Future Appointments  Date Time Provider Department Center  01/07/2024  3:00 PM [facility] PA/NP [facility] None  07/03/2024 10:00 AM Stone[place], Ponce Brisker., MD [facility] None

## 2023-12-26 NOTE — Telephone Encounter (Signed)
 Sound great thank you! Adrian Lambert

## 2023-12-27 ENCOUNTER — Telehealth (HOSPITAL_COMMUNITY): Payer: Self-pay | Admitting: Emergency Medicine

## 2023-12-27 NOTE — Telephone Encounter (Signed)
 Phone call to follow-up w/ Adrian Lambert from yesterday's visit where he was 76/40.  He denies chest pain or SOB.  He took his BP this morning = 80/61.  Denies dizziness or weakness.  Next home visit 6/10 @ 9:15.    Ermalinda Hays, EMT-Paramedic [phone] 12/27/2023

## 2023-12-27 NOTE — Telephone Encounter (Signed)
 NFN

## 2023-12-31 ENCOUNTER — Telehealth (HOSPITAL_COMMUNITY): Payer: Self-pay | Admitting: Family Medicine

## 2023-12-31 ENCOUNTER — Telehealth (HOSPITAL_COMMUNITY): Payer: Self-pay | Admitting: Cardiology

## 2023-12-31 NOTE — Telephone Encounter (Signed)
 Please call and instruct to take extra 20 mg of torsemide .   Makylee Sanborn NP-C  2:57 PM

## 2023-12-31 NOTE — Telephone Encounter (Signed)
 Pt aware and voiced understanding

## 2023-12-31 NOTE — Telephone Encounter (Signed)
 Called to confirm/remind patient of their appointment [facility] on 12/31/23.   Appointment:   [x] Confirmed  [] Left mess   [] No answer/No voice mail  [] VM Full/unable to leave message  [] Phone not in service  Patient reminded to bring all medications and/or complete list.  Confirmed patient has transportation. Gave directions, instructed to utilize valet parking.

## 2023-12-31 NOTE — Telephone Encounter (Signed)
 Pt left VM reporting weight increased by 5lbs in two days. Reports cough and swelling   -would like to know if additional changes are needed before follow up in 3 weeks

## 2024-01-01 ENCOUNTER — Emergency Department (HOSPITAL_COMMUNITY)

## 2024-01-01 ENCOUNTER — Other Ambulatory Visit (HOSPITAL_COMMUNITY): Payer: Self-pay | Admitting: Emergency Medicine

## 2024-01-01 ENCOUNTER — Inpatient Hospital Stay (HOSPITAL_COMMUNITY)

## 2024-01-01 ENCOUNTER — Other Ambulatory Visit: Payer: Self-pay

## 2024-01-01 ENCOUNTER — Inpatient Hospital Stay (HOSPITAL_COMMUNITY)
Admission: EM | Admit: 2024-01-01 | Discharge: 2024-01-28 | DRG: 286 | Disposition: A | Discharge status: Hospice | Attending: Cardiology | Admitting: Cardiology

## 2024-01-01 ENCOUNTER — Telehealth (HOSPITAL_COMMUNITY): Payer: Self-pay | Admitting: Licensed Clinical Social Worker

## 2024-01-01 ENCOUNTER — Telehealth (HOSPITAL_COMMUNITY): Payer: Self-pay | Admitting: *Deleted

## 2024-01-01 DIAGNOSIS — I428 Other cardiomyopathies: Secondary | ICD-10-CM | POA: Diagnosis present

## 2024-01-01 DIAGNOSIS — R0989 Other specified symptoms and signs involving the circulatory and respiratory systems: Secondary | ICD-10-CM | POA: Diagnosis not present

## 2024-01-01 DIAGNOSIS — K7200 Acute and subacute hepatic failure without coma: Secondary | ICD-10-CM | POA: Diagnosis not present

## 2024-01-01 DIAGNOSIS — I110 Hypertensive heart disease with heart failure: Secondary | ICD-10-CM | POA: Diagnosis not present

## 2024-01-01 DIAGNOSIS — E669 Obesity, unspecified: Secondary | ICD-10-CM | POA: Diagnosis not present

## 2024-01-01 DIAGNOSIS — E876 Hypokalemia: Secondary | ICD-10-CM | POA: Diagnosis not present

## 2024-01-01 DIAGNOSIS — N1832 Chronic kidney disease, stage 3b: Secondary | ICD-10-CM | POA: Diagnosis not present

## 2024-01-01 DIAGNOSIS — I5082 Biventricular heart failure: Secondary | ICD-10-CM | POA: Diagnosis not present

## 2024-01-01 DIAGNOSIS — I4892 Unspecified atrial flutter: Secondary | ICD-10-CM | POA: Diagnosis present

## 2024-01-01 DIAGNOSIS — Y712 Prosthetic and other implants, materials and accessory cardiovascular devices associated with adverse incidents: Secondary | ICD-10-CM | POA: Diagnosis not present

## 2024-01-01 DIAGNOSIS — R739 Hyperglycemia, unspecified: Secondary | ICD-10-CM | POA: Diagnosis not present

## 2024-01-01 DIAGNOSIS — K761 Chronic passive congestion of liver: Secondary | ICD-10-CM | POA: Diagnosis present

## 2024-01-01 DIAGNOSIS — J9601 Acute respiratory failure with hypoxia: Secondary | ICD-10-CM | POA: Diagnosis not present

## 2024-01-01 DIAGNOSIS — Z888 Allergy status to other drugs, medicaments and biological substances status: Secondary | ICD-10-CM

## 2024-01-01 DIAGNOSIS — N179 Acute kidney failure, unspecified: Secondary | ICD-10-CM | POA: Diagnosis not present

## 2024-01-01 DIAGNOSIS — R570 Cardiogenic shock: Secondary | ICD-10-CM | POA: Diagnosis not present

## 2024-01-01 DIAGNOSIS — I5023 Acute on chronic systolic (congestive) heart failure: Secondary | ICD-10-CM | POA: Diagnosis not present

## 2024-01-01 DIAGNOSIS — Z8042 Family history of malignant neoplasm of prostate: Secondary | ICD-10-CM

## 2024-01-01 DIAGNOSIS — Z8546 Personal history of malignant neoplasm of prostate: Secondary | ICD-10-CM

## 2024-01-01 DIAGNOSIS — L89152 Pressure ulcer of sacral region, stage 2: Secondary | ICD-10-CM | POA: Diagnosis present

## 2024-01-01 DIAGNOSIS — I5043 Acute on chronic combined systolic (congestive) and diastolic (congestive) heart failure: Secondary | ICD-10-CM | POA: Diagnosis not present

## 2024-01-01 DIAGNOSIS — Z833 Family history of diabetes mellitus: Secondary | ICD-10-CM

## 2024-01-01 DIAGNOSIS — I272 Pulmonary hypertension, unspecified: Secondary | ICD-10-CM | POA: Diagnosis present

## 2024-01-01 DIAGNOSIS — R531 Weakness: Secondary | ICD-10-CM | POA: Diagnosis not present

## 2024-01-01 DIAGNOSIS — I7121 Aneurysm of the ascending aorta, without rupture: Secondary | ICD-10-CM | POA: Diagnosis present

## 2024-01-01 DIAGNOSIS — Z66 Do not resuscitate: Secondary | ICD-10-CM | POA: Diagnosis not present

## 2024-01-01 DIAGNOSIS — Z87442 Personal history of urinary calculi: Secondary | ICD-10-CM

## 2024-01-01 DIAGNOSIS — E875 Hyperkalemia: Secondary | ICD-10-CM | POA: Diagnosis present

## 2024-01-01 DIAGNOSIS — I5084 End stage heart failure: Secondary | ICD-10-CM | POA: Diagnosis not present

## 2024-01-01 DIAGNOSIS — I5021 Acute systolic (congestive) heart failure: Secondary | ICD-10-CM | POA: Diagnosis not present

## 2024-01-01 DIAGNOSIS — J811 Chronic pulmonary edema: Secondary | ICD-10-CM | POA: Diagnosis not present

## 2024-01-01 DIAGNOSIS — Z515 Encounter for palliative care: Secondary | ICD-10-CM | POA: Diagnosis not present

## 2024-01-01 DIAGNOSIS — J8410 Pulmonary fibrosis, unspecified: Secondary | ICD-10-CM | POA: Diagnosis not present

## 2024-01-01 DIAGNOSIS — Z952 Presence of prosthetic heart valve: Secondary | ICD-10-CM | POA: Diagnosis not present

## 2024-01-01 DIAGNOSIS — T82524A Displacement of infusion catheter, initial encounter: Secondary | ICD-10-CM | POA: Diagnosis not present

## 2024-01-01 DIAGNOSIS — Z7982 Long term (current) use of aspirin: Secondary | ICD-10-CM

## 2024-01-01 DIAGNOSIS — N1831 Chronic kidney disease, stage 3a: Secondary | ICD-10-CM | POA: Diagnosis not present

## 2024-01-01 DIAGNOSIS — I130 Hypertensive heart and chronic kidney disease with heart failure and stage 1 through stage 4 chronic kidney disease, or unspecified chronic kidney disease: Principal | ICD-10-CM | POA: Diagnosis present

## 2024-01-01 DIAGNOSIS — I2510 Atherosclerotic heart disease of native coronary artery without angina pectoris: Secondary | ICD-10-CM | POA: Diagnosis present

## 2024-01-01 DIAGNOSIS — Z8249 Family history of ischemic heart disease and other diseases of the circulatory system: Secondary | ICD-10-CM

## 2024-01-01 DIAGNOSIS — I509 Heart failure, unspecified: Secondary | ICD-10-CM

## 2024-01-01 DIAGNOSIS — J810 Acute pulmonary edema: Secondary | ICD-10-CM | POA: Diagnosis not present

## 2024-01-01 DIAGNOSIS — Z6825 Body mass index (BMI) 25.0-25.9, adult: Secondary | ICD-10-CM

## 2024-01-01 DIAGNOSIS — Z79899 Other long term (current) drug therapy: Secondary | ICD-10-CM

## 2024-01-01 DIAGNOSIS — I959 Hypotension, unspecified: Secondary | ICD-10-CM | POA: Diagnosis not present

## 2024-01-01 DIAGNOSIS — N400 Enlarged prostate without lower urinary tract symptoms: Secondary | ICD-10-CM | POA: Diagnosis not present

## 2024-01-01 DIAGNOSIS — R42 Dizziness and giddiness: Secondary | ICD-10-CM | POA: Diagnosis not present

## 2024-01-01 DIAGNOSIS — R918 Other nonspecific abnormal finding of lung field: Secondary | ICD-10-CM | POA: Diagnosis not present

## 2024-01-01 DIAGNOSIS — E871 Hypo-osmolality and hyponatremia: Secondary | ICD-10-CM | POA: Diagnosis not present

## 2024-01-01 DIAGNOSIS — I480 Paroxysmal atrial fibrillation: Secondary | ICD-10-CM | POA: Diagnosis not present

## 2024-01-01 DIAGNOSIS — J849 Interstitial pulmonary disease, unspecified: Secondary | ICD-10-CM | POA: Diagnosis present

## 2024-01-01 DIAGNOSIS — I517 Cardiomegaly: Secondary | ICD-10-CM | POA: Diagnosis not present

## 2024-01-01 DIAGNOSIS — Z452 Encounter for adjustment and management of vascular access device: Secondary | ICD-10-CM | POA: Diagnosis not present

## 2024-01-01 DIAGNOSIS — Z9842 Cataract extraction status, left eye: Secondary | ICD-10-CM

## 2024-01-01 DIAGNOSIS — Z9841 Cataract extraction status, right eye: Secondary | ICD-10-CM

## 2024-01-01 DIAGNOSIS — D72829 Elevated white blood cell count, unspecified: Secondary | ICD-10-CM | POA: Diagnosis present

## 2024-01-01 DIAGNOSIS — R627 Adult failure to thrive: Secondary | ICD-10-CM | POA: Diagnosis present

## 2024-01-01 DIAGNOSIS — R54 Age-related physical debility: Secondary | ICD-10-CM | POA: Diagnosis present

## 2024-01-01 DIAGNOSIS — I5033 Acute on chronic diastolic (congestive) heart failure: Secondary | ICD-10-CM

## 2024-01-01 DIAGNOSIS — Z961 Presence of intraocular lens: Secondary | ICD-10-CM | POA: Diagnosis present

## 2024-01-01 DIAGNOSIS — L8990 Pressure ulcer of unspecified site, unspecified stage: Secondary | ICD-10-CM | POA: Insufficient documentation

## 2024-01-01 DIAGNOSIS — Z7901 Long term (current) use of anticoagulants: Secondary | ICD-10-CM

## 2024-01-01 DIAGNOSIS — J984 Other disorders of lung: Secondary | ICD-10-CM | POA: Diagnosis not present

## 2024-01-01 DIAGNOSIS — M7989 Other specified soft tissue disorders: Secondary | ICD-10-CM | POA: Diagnosis not present

## 2024-01-01 LAB — CBC WITH DIFFERENTIAL/PLATELET
Abs Immature Granulocytes: 0.12 10*3/uL — ABNORMAL HIGH (ref 0.00–0.07)
Basophils Absolute: 0.1 10*3/uL (ref 0.0–0.1)
Basophils Relative: 0 %
Eosinophils Absolute: 0 10*3/uL (ref 0.0–0.5)
Eosinophils Relative: 0 %
HCT: 48.1 % (ref 39.0–52.0)
Hemoglobin: 15 g/dL (ref 13.0–17.0)
Immature Granulocytes: 1 %
Lymphocytes Relative: 5 %
Lymphs Abs: 0.7 10*3/uL (ref 0.7–4.0)
MCH: 20.2 pg — ABNORMAL LOW (ref 26.0–34.0)
MCHC: 31.2 g/dL (ref 30.0–36.0)
MCV: 64.7 fL — ABNORMAL LOW (ref 80.0–100.0)
Monocytes Absolute: 1.1 10*3/uL — ABNORMAL HIGH (ref 0.1–1.0)
Monocytes Relative: 7 %
Neutro Abs: 13.3 10*3/uL — ABNORMAL HIGH (ref 1.7–7.7)
Neutrophils Relative %: 87 %
Platelets: 162 10*3/uL (ref 150–400)
RBC: 7.44 MIL/uL — ABNORMAL HIGH (ref 4.22–5.81)
RDW: 20 % — ABNORMAL HIGH (ref 11.5–15.5)
WBC: 15.4 10*3/uL — ABNORMAL HIGH (ref 4.0–10.5)
nRBC: 1 % — ABNORMAL HIGH (ref 0.0–0.2)

## 2024-01-01 LAB — COMPREHENSIVE METABOLIC PANEL WITH GFR
ALT: 73 U/L — ABNORMAL HIGH (ref 0–44)
AST: 90 U/L — ABNORMAL HIGH (ref 15–41)
Albumin: 3.7 g/dL (ref 3.5–5.0)
Alkaline Phosphatase: 145 U/L — ABNORMAL HIGH (ref 38–126)
Anion gap: 16 — ABNORMAL HIGH (ref 5–15)
BUN: 60 mg/dL — ABNORMAL HIGH (ref 8–23)
CO2: 24 mmol/L (ref 22–32)
Calcium: 9 mg/dL (ref 8.9–10.3)
Chloride: 92 mmol/L — ABNORMAL LOW (ref 98–111)
Creatinine, Ser: 2.14 mg/dL — ABNORMAL HIGH (ref 0.61–1.24)
GFR, Estimated: 30 mL/min — ABNORMAL LOW (ref >60)
Glucose, Bld: 190 mg/dL — ABNORMAL HIGH (ref 70–99)
Potassium: 5.2 mmol/L — ABNORMAL HIGH (ref 3.5–5.1)
Sodium: 132 mmol/L — ABNORMAL LOW (ref 135–145)
Total Bilirubin: 3.4 mg/dL — ABNORMAL HIGH (ref 0.0–1.2)
Total Protein: 6.7 g/dL (ref 6.5–8.1)

## 2024-01-01 LAB — BRAIN NATRIURETIC PEPTIDE: B Natriuretic Peptide: 3066.5 pg/mL — ABNORMAL HIGH (ref 0.0–100.0)

## 2024-01-01 LAB — PROTIME-INR
INR: 2.9 — ABNORMAL HIGH (ref 0.8–1.2)
Prothrombin Time: 30.7 s — ABNORMAL HIGH (ref 11.4–15.2)

## 2024-01-01 LAB — MAGNESIUM: Magnesium: 2.8 mg/dL — ABNORMAL HIGH (ref 1.7–2.4)

## 2024-01-01 LAB — TROPONIN I (HIGH SENSITIVITY)
Troponin I (High Sensitivity): 38 ng/L — ABNORMAL HIGH (ref <18)
Troponin I (High Sensitivity): 41 ng/L — ABNORMAL HIGH (ref <18)

## 2024-01-01 LAB — CREATININE, SERUM
Creatinine, Ser: 2 mg/dL — ABNORMAL HIGH (ref 0.61–1.24)
GFR, Estimated: 33 mL/min — ABNORMAL LOW (ref >60)

## 2024-01-01 MED ORDER — FUROSEMIDE 10 MG/ML IJ SOLN
40.0000 mg | Freq: Once | INTRAMUSCULAR | Status: AC
  Administered 2024-01-01: 40 mg via INTRAVENOUS
  Filled 2024-01-01: qty 4

## 2024-01-01 MED ORDER — HEPARIN SODIUM (PORCINE) 5000 UNIT/ML IJ SOLN
5000.0000 [IU] | Freq: Three times a day (TID) | INTRAMUSCULAR | Status: DC
Start: 2024-01-01 — End: 2024-01-01
  Administered 2024-01-01: 5000 [IU] via SUBCUTANEOUS
  Filled 2024-01-01: qty 1

## 2024-01-01 MED ORDER — FUROSEMIDE 10 MG/ML IJ SOLN
80.0000 mg | Freq: Two times a day (BID) | INTRAMUSCULAR | Status: DC
  Administered 2024-01-01 – 2024-01-03 (×3): 80 mg via INTRAVENOUS
  Filled 2024-01-01 (×3): qty 8

## 2024-01-01 MED ORDER — METOLAZONE 5 MG PO TABS
5.0000 mg | ORAL_TABLET | Freq: Once | ORAL | Status: AC
  Administered 2024-01-01: 5 mg via ORAL
  Filled 2024-01-01 (×2): qty 1

## 2024-01-01 MED ORDER — WARFARIN - PHARMACIST DOSING INPATIENT
Freq: Every day | Status: DC
  Administered 2024-01-05 – 2024-01-09 (×3): 1

## 2024-01-01 MED ORDER — ASPIRIN 81 MG PO TBEC
81.0000 mg | DELAYED_RELEASE_TABLET | Freq: Every day | ORAL | Status: DC
  Administered 2024-01-01 – 2024-01-25 (×25): 81 mg via ORAL
  Filled 2024-01-01 (×25): qty 1

## 2024-01-01 MED ORDER — METOPROLOL SUCCINATE ER 25 MG PO TB24
25.0000 mg | ORAL_TABLET | Freq: Every day | ORAL | Status: DC
  Administered 2024-01-02: 25 mg via ORAL
  Filled 2024-01-01 (×2): qty 1

## 2024-01-01 MED ORDER — WARFARIN SODIUM 2.5 MG PO TABS
2.5000 mg | ORAL_TABLET | Freq: Once | ORAL | Status: AC
  Administered 2024-01-01: 2.5 mg via ORAL
  Filled 2024-01-01: qty 1

## 2024-01-01 MED ORDER — WARFARIN SODIUM 2.5 MG PO TABS
2.5000 mg | ORAL_TABLET | Freq: Once | ORAL | Status: DC
  Filled 2024-01-01: qty 1

## 2024-01-01 MED ORDER — TAMSULOSIN HCL 0.4 MG PO CAPS
0.4000 mg | ORAL_CAPSULE | ORAL | Status: DC
  Administered 2024-01-01 – 2024-01-25 (×13): 0.4 mg via ORAL
  Filled 2024-01-01 (×15): qty 1

## 2024-01-01 NOTE — ED Notes (Signed)
 Called lab to add creatinine level

## 2024-01-01 NOTE — ED Triage Notes (Addendum)
 BIB [facility] from home, patient was brought in from home for shortness of breath, dizziness, weakness, hypotension, CHF clinic patient. No nausea, vomiting or diarrhea reported. Pain 0/10   Wound to right lower leg noted to be infected

## 2024-01-01 NOTE — Progress Notes (Unsigned)
 Paramedicine Encounter    Patient ID: Adrian Lambert, male    DOB: 1940/08/03, 83 y.o.   MRN: [id number]   Complaints***  Assessment***  Compliance with meds***  Pill box filled***  Refills needed***  Meds changes since last visit***    Social changes***   There were no vitals taken for this visit. Weight yesterday-*** Last visit weight-***  ACTION: {Paramed Action:[phone]}  Ermalinda Hays, EMT-Paramedic [phone] 01/01/24  Patient Care Team: Jimmey Mould, MD as PCP - General (Family Medicine) Darlis Eisenmenger, MD as PCP - Cardiology (Cardiology)  Patient Active Problem List   Diagnosis Date Noted  . Sepsis (HCC) 10/17/2022  . BPH (benign prostatic hyperplasia) 10/17/2022  . Multifocal pneumonia 10/16/2022  . Chronic systolic heart failure (HCC) 01/02/2022  . AF (atrial fibrillation) (HCC) 11/28/2016  . Atrial fibrillation (HCC) 08/16/2016  . Acute on chronic combined systolic and diastolic CHF, NYHA class 3 (HCC) 08/15/2016  . Prostate cancer (HCC) 11/26/2012  . Cellulitis 06/26/2012  . S/P AVR (aortic valve replacement) 06/28/2011  . History of aortic stenosis 06/28/2011  . Bifascicular block 06/28/2011  . Essential hypertension 11/04/2007  . RHINITIS 11/04/2007  . GERD 11/04/2007  . COUGH 11/04/2007    Current Outpatient Medications:  .  acetaminophen  (TYLENOL ) 500 MG tablet, Take 500 mg by mouth 2 (two) times daily., Disp: , Rfl:  .  allopurinol  (ZYLOPRIM ) 300 MG tablet, Take 150 mg by mouth daily., Disp: , Rfl:  .  amoxicillin  (AMOXIL ) 500 MG capsule, Take 2,000 mg by mouth See admin instructions. Take 2000 mg 1 hour prior to dental work (Patient not taking: Reported on 12/26/2023), Disp: , Rfl:  .  aspirin  EC 81 MG tablet, Take 81 mg by mouth daily. Swallow whole., Disp: , Rfl:  .  colchicine  0.6 MG tablet, Take 1 tablet (0.6 mg total) by mouth daily as needed (for gout)., Disp: , Rfl:  .  docusate sodium  (COLACE) 100 MG capsule, Take 100 mg  by mouth 2 (two) times daily., Disp: , Rfl:  .  FARXIGA  10 MG TABS tablet, TAKE 1 TABLET BY MOUTH EVERY DAY, Disp: 30 tablet, Rfl: 6 .  FIBER PO, Take 2 capsules by mouth 2 (two) times daily., Disp: , Rfl:  .  folic acid  (FOLVITE ) 1 MG tablet, TAKE 1 TABLET(1 MG) BY MOUTH DAILY, Disp: 90 tablet, Rfl: 3 .  metolazone  (ZAROXOLYN ) 2.5 MG tablet, Take 1 tablet (2.5 mg total) by mouth daily for 2 days., Disp: 2 tablet, Rfl: 0 .  metoprolol  succinate (TOPROL -XL) 25 MG 24 hr tablet, TAKE 1 TABLET BY MOUTH EVERYDAY AT BEDTIME, Disp: 90 tablet, Rfl: 1 .  polyethylene glycol powder (GLYCOLAX /MIRALAX ) 17 GM/SCOOP powder, Take 17 g by mouth 2 (two) times daily as needed for moderate constipation or mild constipation. (Patient not taking: Reported on 12/26/2023), Disp: 238 g, Rfl: 2 .  potassium chloride  SA (KLOR-CON  M) 20 MEQ tablet, Take 1 tablet (20 mEq total) by mouth daily., Disp: , Rfl:  .  tamsulosin  (FLOMAX ) 0.4 MG CAPS capsule, Take 1 capsule (0.4 mg total) by mouth every other day., Disp: 90 capsule, Rfl: 3 .  tolterodine  (DETROL  LA) 4 MG 24 hr capsule, Take 1 capsule (4 mg total) by mouth every other day. Alternating days with the tamsulosin , Disp: 90 capsule, Rfl: 3 .  torsemide  (DEMADEX ) 20 MG tablet, Take 3 tablets (60 mg total) by mouth daily., Disp: , Rfl:  .  warfarin (COUMADIN ) 5 MG tablet, Take 0.5-1 tablets (2.5-5 mg total)  by mouth See admin instructions. 1/19 and 1/20  take 7.5mg  (1 and 1/2 tab) then take 1 tab (5mg ) daily until seen by MD  Take 1 tablet (5 mg) by mouth on Sundays, Mondays, Wednesdays, Thursdays & Saturdays in the evening. Take 0.5 tablet (2.5 mg) by mouth on Tuesdays & Fridays in the evening. (Patient taking differently: Take 2.5-5 mg by mouth See admin instructions. AS directed by PCP 2.5 mg Daily), Disp: 45 tablet, Rfl: 11 Allergies  Allergen Reactions  . Celecoxib Rash    Other Reaction(s): Unknown     Social History   Socioeconomic History  . Marital status: Married     Spouse name: Not on file  . Number of children: 4  . Years of education: Not on file  . Highest education level: Not on file  Occupational History  . 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 Smoking status: Never  . Smokeless tobacco: Never  Vaping Use  . Vaping status: Never Used  Substance and Sexual Activity  . Alcohol use: Yes    Alcohol/week: 3.0 standard drinks of alcohol    Types: 3 Cans of beer per week    Comment:  "may have 2-3 beers on the weekend"  . Drug use: No  . Sexual activity: Not on file  Other Topics Concern  . Not on file  Social History Narrative  . Not on file   Social Drivers of Health   Financial Resource Strain: Not on file  Food Insecurity: Low Risk  (11/14/2023)   Received from [facility]   Hunger Vital Sign   . Worried About Programme researcher, broadcasting/film/video in the Last Year: Never true   . Ran Out of Food in the Last Year: Never true  Transportation Needs: No Transportation Needs (11/14/2023)   Received from [facility]   . In the past 12 months, has lack of reliable transportation kept you from medical appointments, meetings, work or from getting things needed for daily living? : No  Physical Activity: Not on file  Stress: Not on file  Social Connections: Not on file  Intimate Partner Violence: Not At Risk (10/16/2022)   Humiliation, Afraid, Rape, and Kick questionnaire   . Fear of Current or Ex-Partner: No   . Emotionally Abused: No   . Physically Abused: No   . Sexually Abused: No    Physical Exam      Future Appointments  Date Time Provider Department Center  01/07/2024  3:00 PM [facility] PA/NP [facility] None  07/03/2024 10:00 AM Stoneking, Ponce Brisker., MD [facility] None

## 2024-01-01 NOTE — Telephone Encounter (Signed)
 Dede is out seeing patient now and is very concerned about pt, she states he is very SOB resp 36-40/min, BP 92/60, unable to get pulse ox, wt up 5 lbs, crackles in bases. Family reports pt has not been doing well for past few days and continues to get worse. Discussed with Nieves Bars, NP she advises pt should report to ER, Dede is aware and will advise pt and family

## 2024-01-01 NOTE — ED Notes (Addendum)
 MD at [facility]. Lab will add procalcitonin, lab called and notified.

## 2024-01-01 NOTE — Progress Notes (Addendum)
 ANTICOAGULATION CONSULT NOTE  Pharmacy Consult for Warfarin Indication: afib with mechanical AVR  Allergies  Allergen Reactions   Celecoxib Rash    Other Reaction(s): Unknown    Patient Measurements: Height: 5\' 10"  (177.8 cm) IBW/kg (Calculated) : 73  Vital Signs: Temp: 97.3 F (36.3 C) (06/10 1108) Temp Source: Oral (06/10 1108) BP: 91/79 (06/10 1315) Pulse Rate: 96 (06/10 1315)  Labs: Recent Labs    01/01/24 1124  HGB 15.0  HCT 48.1  PLT 162  CREATININE 2.14*  TROPONINIHS 38*    Estimated Creatinine Clearance: 27.5 mL/min (A) (by C-G formula based on SCr of 2.14 mg/dL (H)).   Medical History: Past Medical History:  Diagnosis Date   Anticoagulant long-term use    Arthritis    "probably in my thumbs" (06/26/2012)   Bifascicular block    Chronic combined systolic and diastolic CHF (congestive heart failure) (HCC)    a. 01/2016 Echo: EF 45-50%, gr2 DD, inflat AK (poor acoustic windows);  b. 07/2016 Echo: EF 25-30%, mild LVH, PASP (pt in Afib).   Complication of anesthesia    "wake up w/a start; hallucinations" (06/26/2012)   Critical Aortic Stenosis    a. 03/2003 s/p SJM #25 mech prosthetic AoV;  b. 07/2016 Echo: EF 25-30% (in setting of AF), AoV area 1.72 cm^2 (VTI), 1.75 cm^2 (Vmean).   Diverticulosis    Gouty arthritis    "have had it in both feet, ankles, right knee" (06/26/2012)   History of diverticulitis of colon    History of kidney stones    History of pancreatitis    Hypertension    Incomplete RBBB    PAF (paroxysmal atrial fibrillation) (HCC)    a. CHA2DS2VASc = 4-->chronic coumadin  in setting of mech AoV;  b. 07/2016 Recurrent AF RVR.    Medications:  (Not in a hospital admission)  Scheduled:   aspirin  EC  81 mg Oral Daily   furosemide   80 mg Intravenous BID   heparin   5,000 Units Subcutaneous Q8H   metolazone   5 mg Oral Once   metoprolol  succinate  25 mg Oral Daily   tamsulosin   0.4 mg Oral QODAY   Infusions:  PRN:   Assessment: 83  yo male presenting with worsening shortness of breath. On warfarin PTA for afib with mechanical AVR.    Per patient, dose is 5 mg daily. However, upon chart review patient should be on 2.5 mg once daily on Monday, Tuesday, Thursday, Friday, and Sunday and 1 mg on Wednesday and Saturday per last PCP note on 12/18/2023. Goal INR is 2.5-3.5.   Pt did receive SQ heparin  dose this afternoon.   PT / INR today is 30.7 / 2.9, which is therapeutic Hgb 15; plt 162  Goal of Therapy:  INR 2.5-3.5 Monitor platelets by anticoagulation protocol: Yes   Plan:  Will give 2.5 mg warfarin today. Repeat dosing per INR Monitor for s/s of hemorrhage, daily INR, CBC Watch for new DDIs  Dionicio Fray, PharmD, BCPS 01/01/2024 2:51 PM ED Clinical Pharmacist -  [phone]

## 2024-01-01 NOTE — H&P (Signed)
 History and Physical    Patient: Adrian Lambert ZOX:[id number] DOB: 09-10-1940 DOA: 01/01/2024 DOS: the patient was seen and examined on 01/01/2024 PCP: Jimmey Mould, MD  Patient coming from: Home  Chief Complaint:  Chief Complaint  Patient presents with   Shortness of Breath   HPI: [name] is a 83 y.o. male with medical history significant of ILD, HFrEF (EF 30-35% in 12/2022), mechanical AVR and PAF on warfarin, and BPH p/w SOB and elevated BNP c/w HFrEF exacerbation.  Pt is a poor historian and not accompanied by his wife. From what I can gather, he presented to the ED after worsening SOB over the past week. He reports good adherence to a low salt diet and his medications. Denies any recent travel or sick contacts.   In the ED, pt tachypneic on RA. Labs notable for Na 132, K 5.2, Cr 2.14, BNP 3066, and WBC 15.4. CXR showed chronic ILD.  Review of Systems: As mentioned in the history of present illness. All other systems reviewed and are negative. Past Medical History:  Diagnosis Date   Anticoagulant long-term use    Arthritis    "probably in my thumbs" (06/26/2012)   Bifascicular block    Chronic combined systolic and diastolic CHF (congestive heart failure) (HCC)    a. 01/2016 Echo: EF 45-50%, gr2 DD, inflat AK (poor acoustic windows);  b. 07/2016 Echo: EF 25-30%, mild LVH, PASP (pt in Afib).   Complication of anesthesia    "wake up w/a start; hallucinations" (06/26/2012)   Critical Aortic Stenosis    a. 03/2003 s/p SJM #25 mech prosthetic AoV;  b. 07/2016 Echo: EF 25-30% (in setting of AF), AoV area 1.72 cm^2 (VTI), 1.75 cm^2 (Vmean).   Diverticulosis    Gouty arthritis    "have had it in both feet, ankles, right knee" (06/26/2012)   History of diverticulitis of colon    History of kidney stones    History of pancreatitis    Hypertension    Incomplete RBBB    PAF (paroxysmal atrial fibrillation) (HCC)    a. CHA2DS2VASc = 4-->chronic coumadin  in setting of  mech AoV;  b. 07/2016 Recurrent AF RVR.   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  DR GERHARDT   #26mm [facility] JUDE MECHNICAL PROSTHESIS   ATRIAL FIBRILLATION ABLATION N/A 11/28/2016   Procedure: Atrial Fibrillation Ablation;  Surgeon: Lei Pump, MD;  Location: [facility] INVASIVE CV LAB;  Service: Cardiovascular;  Laterality: N/A;   CARDIOVERSION N/A 08/18/2016   Procedure: CARDIOVERSION;  Surgeon: Lake Pilgrim, MD;  Location: [facility] ENDOSCOPY;  Service: Cardiovascular;  Laterality: N/A;   CARDIOVERSION N/A 08/28/2016   Procedure: CARDIOVERSION;  Surgeon: Darlis Eisenmenger, MD;  Location: [facility] ENDOSCOPY;  Service: Cardiovascular;  Laterality: N/A;   CARDIOVERSION N/A 04/05/2022   Procedure: CARDIOVERSION;  Surgeon: Darlis Eisenmenger, MD;  Location: [facility] ENDOSCOPY;  Service: Cardiovascular;  Laterality: N/A;   CATARACT EXTRACTION W/ INTRAOCULAR LENS  IMPLANT, BILATERAL  RIGHT 2010/  LEFT DEC 2013   COLONOSCOPY WITH PROPOFOL  N/A 02/22/2022   Procedure: COLONOSCOPY WITH PROPOFOL ;  Surgeon: Ozell Blunt, MD;  Location: [facility] ENDOSCOPY;  Service: Gastroenterology;  Laterality: N/A;   HEMICOLECTOMY  10/06/2003   Laparoscopic-assisted left hemicolectomy for diverticulitis   LAPAROSCOPIC CHOLECYSTECTOMY  07-23-2006   POLYPECTOMY  02/22/2022   Procedure: POLYPECTOMY;  Surgeon: Ozell Blunt, MD;  Location: [facility] ENDOSCOPY;  Service: Gastroenterology;;   PROSTATE BIOPSY  10/09/2012   gleason  3+4=7   RADIOACTIVE SEED IMPLANT N/A 01/09/2013   Procedure: RADIOACTIVE SEED IMPLANT;  Surgeon: Trent Frizzle, MD;  Location: [facility];  Service: Urology;  Laterality: N/A;   RIGHT/LEFT HEART CATH AND CORONARY ANGIOGRAPHY N/A 08/11/2021   Procedure: RIGHT/LEFT HEART CATH AND CORONARY ANGIOGRAPHY;  Surgeon: Darlis Eisenmenger, MD;  Location: [facility] INVASIVE CV LAB;  Service: Cardiovascular;  Laterality: N/A;   TEE WITHOUT CARDIOVERSION N/A 08/18/2016   Procedure: TRANSESOPHAGEAL  ECHOCARDIOGRAM (TEE);  Surgeon: Lake Pilgrim, MD;  Location: [facility] ENDOSCOPY;  Service: Cardiovascular;  Laterality: N/A;   TRANSTHORACIC ECHOCARDIOGRAM  04-28-2008   MILD LVH / NORMAL LSF/ NORMAL AORTIC VALVE MECHANICAL PROSTHESIS FUNCTION/ MILD LAE/ EF 55-60%   Social History:  reports that he has never smoked. He has never used smokeless tobacco. He reports current alcohol use of about 3.0 standard drinks of alcohol per week. He reports that he does not use drugs.  Allergies  Allergen Reactions   Celecoxib Rash    Other Reaction(s): Unknown    Family History  Problem Relation Age of Onset   Heart failure Mother    Diabetes Mother    Prostate cancer Father     Prior to Admission medications   Medication Sig Start Date End Date Taking? Authorizing Provider  acetaminophen  (TYLENOL ) 500 MG tablet Take 500 mg by mouth 2 (two) times daily.    [provider]  allopurinol  (ZYLOPRIM ) 300 MG tablet Take 150 mg by mouth daily.    [provider]  amoxicillin  (AMOXIL ) 500 MG capsule Take 2,000 mg by mouth See admin instructions. Take 2000 mg 1 hour prior to dental work Patient not taking: Reported on 12/26/2023    [provider]  aspirin  EC 81 MG tablet Take 81 mg by mouth daily. Swallow whole.    [provider]  colchicine  0.6 MG tablet Take 1 tablet (0.6 mg total) by mouth daily as needed (for gout). 10/19/22   Gonfa, Taye T, MD  docusate sodium  (COLACE) 100 MG capsule Take 100 mg by mouth 2 (two) times daily.    [provider]  FARXIGA  10 MG TABS tablet TAKE 1 TABLET BY MOUTH EVERY DAY 12/18/23   McLean, Dalton S, MD  FIBER PO Take 2 capsules by mouth 2 (two) times daily.    [provider]  folic acid  (FOLVITE ) 1 MG tablet TAKE 1 TABLET(1 MG) BY MOUTH DAILY 01/07/21   Ivor Mars, MD  metolazone  (ZAROXOLYN ) 2.5 MG tablet Take 1 tablet (2.5 mg total) by mouth daily for 2 days. 12/21/23 12/23/23  Ruddy Corral M, PA-C  metoprolol   succinate (TOPROL -XL) 25 MG 24 hr tablet TAKE 1 TABLET BY MOUTH EVERYDAY AT BEDTIME 07/16/23   McLean, Dalton S, MD  polyethylene glycol powder (GLYCOLAX /MIRALAX ) 17 GM/SCOOP powder Take 17 g by mouth 2 (two) times daily as needed for moderate constipation or mild constipation. Patient not taking: Reported on 12/26/2023 10/19/22   Gonfa, Taye T, MD  potassium chloride  SA (KLOR-CON  M) 20 MEQ tablet Take 1 tablet (20 mEq total) by mouth daily. 12/27/23   Clegg, Amy D, NP  tamsulosin  (FLOMAX ) 0.4 MG CAPS capsule Take 1 capsule (0.4 mg total) by mouth every other day. 07/05/23   Stoneking, Ponce Brisker., MD  tolterodine  (DETROL  LA) 4 MG 24 hr capsule Take 1 capsule (4 mg total) by mouth every other day. Alternating days with the tamsulosin  07/05/23   Stoneking, Ponce Brisker., MD  torsemide  (DEMADEX ) 20 MG tablet Take 3 tablets (  60 mg total) by mouth daily. 12/27/23   Clegg, Amy D, NP  warfarin (COUMADIN ) 5 MG tablet Take 0.5-1 tablets (2.5-5 mg total) by mouth See admin instructions. 1/19 and 1/20  take 7.5mg  (1 and 1/2 tab) then take 1 tab (5mg ) daily until seen by MD  Take 1 tablet (5 mg) by mouth on Sundays, Mondays, Wednesdays, Thursdays & Saturdays in the evening. Take 0.5 tablet (2.5 mg) by mouth on Tuesdays & Fridays in the evening. Patient taking differently: Take 2.5-5 mg by mouth See admin instructions. AS directed by PCP 2.5 mg Daily 08/11/21   Darlis Eisenmenger, MD    Physical Exam: Vitals:   01/01/24 1200 01/01/24 1230 01/01/24 1300 01/01/24 1315  BP: 91/78 (!) 136/105 94/79 91/79  Pulse: 94 97 97 96  Resp: 18 (!) 30 20 (!) 24  Temp:      TempSrc:      SpO2: 100% 100% 100% 100%  Height:       General: Alert, oriented x3, resting comfortably in no acute distress Respiratory: Lungs clear to auscultation bilaterally with normal respiratory effort; no w/r/r Cardiovascular: Regular rate and rhythm w/o m/r/g   Data Reviewed:  Lab Results  Component Value Date   WBC 15.4 (H) 01/01/2024   HGB  15.0 01/01/2024   HCT 48.1 01/01/2024   MCV 64.7 (L) 01/01/2024   PLT 162 01/01/2024   Lab Results  Component Value Date   GLUCOSE 190 (H) 01/01/2024   CALCIUM 9.0 01/01/2024   NA 132 (L) 01/01/2024   K 5.2 (H) 01/01/2024   CO2 24 01/01/2024   CL 92 (L) 01/01/2024   BUN 60 (H) 01/01/2024   CREATININE 2.14 (H) 01/01/2024   Lab Results  Component Value Date   ALT 73 (H) 01/01/2024   AST 90 (H) 01/01/2024   ALKPHOS 145 (H) 01/01/2024   BILITOT 3.4 (H) 01/01/2024   Lab Results  Component Value Date   INR 2.4 (H) 01/17/2023   INR 2.2 (H) 10/19/2022   INR 3.1 (H) 10/18/2022    Radiology: DG Chest Portable 1 View Result Date: 01/01/2024 CLINICAL DATA:  83 year old male with shortness of breath, dizziness, weakness, hypotension. EXAM: PORTABLE CHEST 1 VIEW COMPARISON:  High-resolution chest CT 09/24/2023 and earlier. FINDINGS: Portable AP semi upright view at love 40 hours. Mildly lower lung volumes compared to February radiographs. Stable sternotomy, Stable cardiac size and mediastinal contours. Interstitial lung disease with peripheral subpleural in hazy opacity. No superimposed pneumothorax, pulmonary edema, definite pleural effusion or acute pulmonary opacity. No acute osseous abnormality identified. Paucity of bowel gas in the visible abdomen. IMPRESSION: Chronic interstitial lung disease with no acute cardiopulmonary abnormality. Electronically Signed   By: Marlise Simpers M.D.   On: 01/01/2024 11:48    Assessment and Plan: 1M h/o ILD, HFrEF (EF 30-35% in 12/2022), mechanical AVR and PAF on warfarin, and BPH p/w SOB and elevated BNP c/w HFrEF exacerbation.  HFrEF exacerbation Pt with HFrEF and ILD and recently treated with steroid taper which could explain his water  retention -IV lasix  80mg  BID for now; goal net neg 1-2L/d; strict I/Os; daily standing weights; K>4/Mg>2 -PO metolazone  5mg  x1 -PTA metoprolol  XL 25mg  daily -Consider TTE once pt euvolemic prior to  d/c  Leukocytosis H/o ILD -F/u CT chest to exclude CAP -F/u procalcitonin  BPH -PTA flomax  0.4mg  daily   Advance Care Planning:   Code Status: Full Code   Consults: N/A  Family Communication: N/A  Severity of Illness: The appropriate patient status  for this patient is INPATIENT. Inpatient status is judged to be reasonable and necessary in order to provide the required intensity of service to ensure the patient's safety. The patient's presenting symptoms, physical exam findings, and initial radiographic and laboratory data in the context of their chronic comorbidities is felt to place them at high risk for further clinical deterioration. Furthermore, it is not anticipated that the patient will be medically stable for discharge from the hospital within 2 midnights of admission.   * I certify that at the point of admission it is my clinical judgment that the patient will require inpatient hospital care spanning beyond 2 midnights from the point of admission due to high intensity of service, high risk for further deterioration and high frequency of surveillance required.*   ------- I spent 55 minutes reviewing previous labs/notes, obtaining separate history at the bedside, counseling/discussing the treatment plan outlined above, ordering medications/tests, and performing clinical documentation.  Author: Arne Langdon, MD 01/01/2024 2:22 PM  For on call review [email or web address].

## 2024-01-01 NOTE — ED Provider Notes (Signed)
 [place] [facility]ERGENCY DEPARTMENT AT [facility] Provider Note  CSN: [id number] Arrival date & time: 01/01/24 1059  Chief Complaint(s) Shortness of Breath  HPI Adrian Lambert is a 83 y.o. male history of CHF, interstitial lung disease presenting with shortness of breath.  Patient reports worsening shortness of breath, over the past week or so.  Reports dyspnea with exertion, also having some orthopnea.  No fevers or chills.  Reports some dry cough.  No abdominal pain.  No chest pain.  No back pain.  Has had some recent weight gain.  Was seen by cardiology paramedic who advised that he should come to get checked out.   Past Medical History Past Medical History:  Diagnosis Date   Anticoagulant long-term use    Arthritis    "probably in my thumbs" (06/26/2012)   Bifascicular block    Chronic combined systolic and diastolic CHF (congestive heart failure) (HCC)    a. 01/2016 Echo: EF 45-50%, gr2 DD, inflat AK (poor acoustic windows);  b. 07/2016 Echo: EF 25-30%, mild LVH, PASP (pt in Afib).   Complication of anesthesia    "wake up w/a start; hallucinations" (06/26/2012)   Critical Aortic Stenosis    a. 03/2003 s/p SJM #25 mech prosthetic AoV;  b. 07/2016 Echo: EF 25-30% (in setting of AF), AoV area 1.72 cm^2 (VTI), 1.75 cm^2 (Vmean).   Diverticulosis    Gouty arthritis    "have had it in both feet, ankles, right knee" (06/26/2012)   History of diverticulitis of colon    History of kidney stones    History of pancreatitis    Hypertension    Incomplete RBBB    PAF (paroxysmal atrial fibrillation) (HCC)    a. CHA2DS2VASc = 4-->chronic coumadin  in setting of mech AoV;  b. 07/2016 Recurrent AF RVR.   Patient Active Problem List   Diagnosis Date Noted   Acute on chronic heart failure (HCC) 01/01/2024   Sepsis (HCC) 10/17/2022   BPH (benign prostatic hyperplasia) 10/17/2022   Multifocal pneumonia 10/16/2022   Chronic systolic heart failure (HCC) 01/02/2022   AF (atrial  fibrillation) (HCC) 11/28/2016   Atrial fibrillation (HCC) 08/16/2016   Acute on chronic combined systolic and diastolic CHF, NYHA class 3 (HCC) 08/15/2016   Prostate cancer (HCC) 11/26/2012   Cellulitis 06/26/2012   S/P AVR (aortic valve replacement) 06/28/2011   History of aortic stenosis 06/28/2011   Bifascicular block 06/28/2011   Essential hypertension 11/04/2007   RHINITIS 11/04/2007   GERD 11/04/2007   COUGH 11/04/2007   Home Medication(s) Prior to Admission medications   Medication Sig Start Date End Date Taking? Authorizing Provider  acetaminophen  (TYLENOL ) 500 MG tablet Take 500 mg by mouth 2 (two) times daily.    [provider]  albuterol  (VENTOLIN  HFA) 108 (90 Base) MCG/ACT inhaler Inhale 2 puffs into the lungs every 4 (four) hours.    [provider]  allopurinol  (ZYLOPRIM ) 300 MG tablet Take 150 mg by mouth daily.    [provider]  amoxicillin  (AMOXIL ) 500 MG capsule Take 2,000 mg by mouth See admin instructions. Take 2000 mg 1 hour prior to dental work Patient not taking: Reported on 12/26/2023    [provider]  aspirin  EC 81 MG tablet Take 81 mg by mouth daily. Swallow whole.    [provider]  colchicine  0.6 MG tablet Take 1 tablet (0.6 mg total) by mouth daily as needed (for gout). 10/19/22   Gonfa, Taye T, MD  docusate sodium  (COLACE) 100  MG capsule Take 100 mg by mouth 2 (two) times daily.    [provider]  FARXIGA  10 MG TABS tablet TAKE 1 TABLET BY MOUTH EVERY DAY 12/18/23   McLean, Dalton S, MD  FIBER PO Take 2 capsules by mouth 2 (two) times daily.    [provider]  folic acid  (FOLVITE ) 1 MG tablet TAKE 1 TABLET(1 MG) BY MOUTH DAILY 01/07/21   Ivor Mars, MD  metolazone  (ZAROXOLYN ) 2.5 MG tablet Take 1 tablet (2.5 mg total) by mouth daily for 2 days. 12/21/23 12/23/23  Ruddy Corral M, PA-C  metoprolol  succinate (TOPROL -XL) 25 MG 24 hr tablet TAKE 1 TABLET BY MOUTH EVERYDAY AT BEDTIME  07/16/23   McLean, Dalton S, MD  polyethylene glycol powder (GLYCOLAX /MIRALAX ) 17 GM/SCOOP powder Take 17 g by mouth 2 (two) times daily as needed for moderate constipation or mild constipation. Patient not taking: Reported on 12/26/2023 10/19/22   Gonfa, Taye T, MD  potassium chloride  SA (KLOR-CON  M) 20 MEQ tablet Take 1 tablet (20 mEq total) by mouth daily. 12/27/23   Clegg, Amy D, NP  tamsulosin  (FLOMAX ) 0.4 MG CAPS capsule Take 1 capsule (0.4 mg total) by mouth every other day. 07/05/23   Stoneking, Ponce Brisker., MD  tolterodine  (DETROL  LA) 4 MG 24 hr capsule Take 1 capsule (4 mg total) by mouth every other day. Alternating days with the tamsulosin  07/05/23   Stoneking, Ponce Brisker., MD  torsemide  (DEMADEX ) 20 MG tablet Take 3 tablets (60 mg total) by mouth daily. 12/27/23   Clegg, Amy D, NP  warfarin (COUMADIN ) 5 MG tablet Take 0.5-1 tablets (2.5-5 mg total) by mouth See admin instructions. 1/19 and 1/20  take 7.5mg  (1 and 1/2 tab) then take 1 tab (5mg ) daily until seen by MD  Take 1 tablet (5 mg) by mouth on Sundays, Mondays, Wednesdays, Thursdays & Saturdays in the evening. Take 0.5 tablet (2.5 mg) by mouth on Tuesdays & Fridays in the evening. Patient taking differently: Take 2.5-5 mg by mouth See admin instructions. AS directed by PCP 2.5 mg Daily 08/11/21   Darlis Eisenmenger, MD                                                                                                                                    Past Surgical History 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  DR GERHARDT   #34mm [facility] MECHNICAL PROSTHESIS   ATRIAL FIBRILLATION ABLATION N/A 11/28/2016   Procedure: Atrial Fibrillation Ablation;  Surgeon: Lei Pump, MD;  Location: [facility] INVASIVE CV LAB;  Service: Cardiovascular;  Laterality: N/A;   CARDIOVERSION N/A 08/18/2016   Procedure: CARDIOVERSION;  Surgeon: Lake Pilgrim, MD;  Location: [facility] ENDOSCOPY;  Service: Cardiovascular;   Laterality: N/A;   CARDIOVERSION N/A 08/28/2016   Procedure: CARDIOVERSION;  Surgeon: Darlis Eisenmenger, MD;  Location: [facility] ENDOSCOPY;  Service: Cardiovascular;  Laterality: N/A;   CARDIOVERSION N/A 04/05/2022   Procedure: CARDIOVERSION;  Surgeon: Darlis Eisenmenger, MD;  Location: [facility] ENDOSCOPY;  Service: Cardiovascular;  Laterality: N/A;   CATARACT EXTRACTION W/ INTRAOCULAR LENS  IMPLANT, BILATERAL  RIGHT 2010/  LEFT DEC 2013   COLONOSCOPY WITH PROPOFOL  N/A 02/22/2022   Procedure: COLONOSCOPY WITH PROPOFOL ;  Surgeon: Ozell Blunt, MD;  Location: [facility] ENDOSCOPY;  Service: Gastroenterology;  Laterality: N/A;   HEMICOLECTOMY  10/06/2003   Laparoscopic-assisted left hemicolectomy for diverticulitis   LAPAROSCOPIC CHOLECYSTECTOMY  07-23-2006   POLYPECTOMY  02/22/2022   Procedure: POLYPECTOMY;  Surgeon: Ozell Blunt, MD;  Location: [facility] ENDOSCOPY;  Service: Gastroenterology;;   PROSTATE BIOPSY  10/09/2012   gleason 3+4=7   RADIOACTIVE SEED IMPLANT N/A 01/09/2013   Procedure: RADIOACTIVE SEED IMPLANT;  Surgeon: Trent Frizzle, MD;  Location: [facility];  Service: Urology;  Laterality: N/A;   RIGHT/LEFT HEART CATH AND CORONARY ANGIOGRAPHY N/A 08/11/2021   Procedure: RIGHT/LEFT HEART CATH AND CORONARY ANGIOGRAPHY;  Surgeon: Darlis Eisenmenger, MD;  Location: [facility] INVASIVE CV LAB;  Service: Cardiovascular;  Laterality: N/A;   TEE WITHOUT CARDIOVERSION N/A 08/18/2016   Procedure: TRANSESOPHAGEAL ECHOCARDIOGRAM (TEE);  Surgeon: Lake Pilgrim, MD;  Location: [facility] ENDOSCOPY;  Service: Cardiovascular;  Laterality: N/A;   TRANSTHORACIC ECHOCARDIOGRAM  04-28-2008   MILD LVH / NORMAL LSF/ NORMAL AORTIC VALVE MECHANICAL PROSTHESIS FUNCTION/ MILD LAE/ EF 55-60%   Family History Family History  Problem Relation Age of Onset   Heart failure Mother    Diabetes Mother    Prostate cancer Father     Social History Social History   Tobacco Use   Smoking status: Never   Smokeless tobacco: Never  Vaping Use    Vaping status: Never Used  Substance Use Topics   Alcohol use: Yes    Alcohol/week: 3.0 standard drinks of alcohol    Types: 3 Cans of beer per week    Comment:  "may have 2-3 beers on the weekend"   Drug use: No   Allergies Celecoxib  Review of Systems Review of Systems  All other systems reviewed and are negative.   Physical Exam Vital Signs  I have reviewed the triage vital signs BP 95/78   Pulse 98   Temp (!) 97.3 F (36.3 C) (Oral)   Resp (!) 38   Ht 5\' 10"  (1.778 m)   SpO2 99%   BMI 25.51 kg/m  Physical Exam Vitals and nursing note reviewed.  Constitutional:      General: He is not in acute distress.    Appearance: Normal appearance.  HENT:     Mouth/Throat:     Mouth: Mucous membranes are moist.  Eyes:     Conjunctiva/sclera: Conjunctivae normal.  Cardiovascular:     Rate and Rhythm: Normal rate and regular rhythm.  Pulmonary:     Comments: Increased work of breathing with shortness of breath even at rest, not speaking full sentences.  Bibasilar crackles on exam Abdominal:     General: Abdomen is flat.     Palpations: Abdomen is soft.     Tenderness: There is no abdominal tenderness.  Musculoskeletal:     Right lower leg: Edema present.     Left lower leg: Edema present.     Comments: Chronic appearing anterior shin and right lower extremity wound, appears consistent with venous stasis  Skin:    General: Skin is warm and dry.     Capillary Refill: Capillary refill takes less than 2 seconds.  Neurological:  Mental Status: He is alert and oriented to person, place, and time. Mental status is at baseline.  Psychiatric:        Mood and Affect: Mood normal.        Behavior: Behavior normal.     ED Results and Treatments Labs (all labs ordered are listed, but only abnormal results are displayed) Labs Reviewed  COMPREHENSIVE METABOLIC PANEL WITH GFR - Abnormal; Notable for the following components:      Result Value   Sodium 132 (*)    Potassium  5.2 (*)    Chloride 92 (*)    Glucose, Bld 190 (*)    BUN 60 (*)    Creatinine, Ser 2.14 (*)    AST 90 (*)    ALT 73 (*)    Alkaline Phosphatase 145 (*)    Total Bilirubin 3.4 (*)    GFR, Estimated 30 (*)    Anion gap 16 (*)    All other components within normal limits  CBC WITH DIFFERENTIAL/PLATELET - Abnormal; Notable for the following components:   WBC 15.4 (*)    RBC 7.44 (*)    MCV 64.7 (*)    MCH 20.2 (*)    RDW 20.0 (*)    nRBC 1.0 (*)    Neutro Abs 13.3 (*)    Monocytes Absolute 1.1 (*)    Abs Immature Granulocytes 0.12 (*)    All other components within normal limits  BRAIN NATRIURETIC PEPTIDE - Abnormal; Notable for the following components:   B Natriuretic Peptide 3,066.5 (*)    All other components within normal limits  MAGNESIUM  - Abnormal; Notable for the following components:   Magnesium  2.8 (*)    All other components within normal limits  TROPONIN I (HIGH SENSITIVITY) - Abnormal; Notable for the following components:   Troponin I (High Sensitivity) 38 (*)    All other components within normal limits  PROTIME-INR  CREATININE, SERUM  CBC  PROCALCITONIN  TROPONIN I (HIGH SENSITIVITY)                                                                                                                          Radiology DG Chest Portable 1 View Result Date: 01/01/2024 CLINICAL DATA:  83 year old male with shortness of breath, dizziness, weakness, hypotension. EXAM: PORTABLE CHEST 1 VIEW COMPARISON:  High-resolution chest CT 09/24/2023 and earlier. FINDINGS: Portable AP semi upright view at love 40 hours. Mildly lower lung volumes compared to February radiographs. Stable sternotomy, Stable cardiac size and mediastinal contours. Interstitial lung disease with peripheral subpleural in hazy opacity. No superimposed pneumothorax, pulmonary edema, definite pleural effusion or acute pulmonary opacity. No acute osseous abnormality identified. Paucity of bowel gas in the visible  abdomen. IMPRESSION: Chronic interstitial lung disease with no acute cardiopulmonary abnormality. Electronically Signed   By: Marlise Simpers M.D.   On: 01/01/2024 11:48    Pertinent labs & imaging results that were available during my care of the patient were reviewed by  me and considered in my medical decision making (see MDM for details).  Medications Ordered in ED Medications  heparin  injection 5,000 Units (has no administration in time range)  aspirin  EC tablet 81 mg (has no administration in time range)  metoprolol  succinate (TOPROL -XL) 24 hr tablet 25 mg (has no administration in time range)  tamsulosin  (FLOMAX ) capsule 0.4 mg (has no administration in time range)  furosemide  (LASIX ) injection 80 mg (has no administration in time range)  metolazone  (ZAROXOLYN ) tablet 5 mg (has no administration in time range)  furosemide  (LASIX ) injection 40 mg (40 mg Intravenous Given 01/01/24 1135)                                                                                                                                     Procedures Procedures  (including critical care time)  Medical Decision Making / ED Course   MDM:  83 year old presenting to the emergency department with fatigue, weakness.  On exam, patient with increased work of breathing, basilar crackles.  Not hypoxic but tachypneic.  Does have some lower extremity swelling.  Differential includes CHF, progression of underlying interstitial lung disease.  Lower concern for other process such as pulmonary embolism, patient on chronic warfarin therapy.  No focal pulmonary finding on exam or chest x-ray to suggest pneumonia no productive cough or fevers.  Chest x-ray with no pneumothorax.  Given signs of volume overload will give Lasix .  BNP is elevated.  Also has leukocytosis however appears somewhat chronic and without signs of infection would not give antibiotics.  Discussed with the hospitalist who will admit patient.      Additional  history obtained: -Additional history obtained from ems and spouse -External records from outside source obtained and reviewed including: Chart review including previous notes, labs, imaging, consultation notes including prior notes    Lab Tests: -I ordered, reviewed, and interpreted labs.   The pertinent results include:   Labs Reviewed  COMPREHENSIVE METABOLIC PANEL WITH GFR - Abnormal; Notable for the following components:      Result Value   Sodium 132 (*)    Potassium 5.2 (*)    Chloride 92 (*)    Glucose, Bld 190 (*)    BUN 60 (*)    Creatinine, Ser 2.14 (*)    AST 90 (*)    ALT 73 (*)    Alkaline Phosphatase 145 (*)    Total Bilirubin 3.4 (*)    GFR, Estimated 30 (*)    Anion gap 16 (*)    All other components within normal limits  CBC WITH DIFFERENTIAL/PLATELET - Abnormal; Notable for the following components:   WBC 15.4 (*)    RBC 7.44 (*)    MCV 64.7 (*)    MCH 20.2 (*)    RDW 20.0 (*)    nRBC 1.0 (*)    Neutro Abs 13.3 (*)    Monocytes Absolute 1.1 (*)  Abs Immature Granulocytes 0.12 (*)    All other components within normal limits  BRAIN NATRIURETIC PEPTIDE - Abnormal; Notable for the following components:   B Natriuretic Peptide 3,066.5 (*)    All other components within normal limits  MAGNESIUM  - Abnormal; Notable for the following components:   Magnesium  2.8 (*)    All other components within normal limits  TROPONIN I (HIGH SENSITIVITY) - Abnormal; Notable for the following components:   Troponin I (High Sensitivity) 38 (*)    All other components within normal limits  PROTIME-INR  CREATININE, SERUM  CBC  PROCALCITONIN  TROPONIN I (HIGH SENSITIVITY)    Notable for elevated BNP, leukocytosis, mild AKI on CKD  EKG   EKG Interpretation Date/Time:  Tuesday January 01 2024 11:05:21 EDT Ventricular Rate:  95 PR Interval:  225 QRS Duration:  128 QT Interval:  383 QTC Calculation: 482 R Axis:   -65  Text Interpretation: Sinus rhythm Ventricular  premature complex Prolonged PR interval Right bundle branch block Inferior infarct, old Lateral leads are also involved Confirmed by Hiawatha Lout ([id number]) on 01/01/2024 12:19:47 PM         Imaging Studies ordered: I ordered imaging studies including chest x-ray On my interpretation imaging demonstrates no acute process I independently visualized and interpreted imaging. I agree with the radiologist interpretation   Medicines ordered and prescription drug management: Meds ordered this encounter  Medications   furosemide  (LASIX ) injection 40 mg   heparin  injection 5,000 Units   aspirin  EC tablet 81 mg    Swallow whole.     metoprolol  succinate (TOPROL -XL) 24 hr tablet 25 mg   tamsulosin  (FLOMAX ) capsule 0.4 mg   furosemide  (LASIX ) injection 80 mg   metolazone  (ZAROXOLYN ) tablet 5 mg    -I have reviewed the patients home medicines and have made adjustments as needed   Consultations Obtained: I requested consultation with the hospitalist,  and discussed lab and imaging findings as well as pertinent plan - they recommend: Admission   Cardiac Monitoring: The patient was maintained on a cardiac monitor.  I personally viewed and interpreted the cardiac monitored which showed an underlying rhythm of: Normal sinus rhythm  Social Determinants of Health:  Diagnosis or treatment significantly limited by social determinants of health: obesity   Reevaluation: After the interventions noted above, I reevaluated the patient and found that their symptoms have improved  Co morbidities that complicate the patient evaluation  Past Medical History:  Diagnosis Date   Anticoagulant long-term use    Arthritis    "probably in my thumbs" (06/26/2012)   Bifascicular block    Chronic combined systolic and diastolic CHF (congestive heart failure) (HCC)    a. 01/2016 Echo: EF 45-50%, gr2 DD, inflat AK (poor acoustic windows);  b. 07/2016 Echo: EF 25-30%, mild LVH, PASP (pt in Afib).    Complication of anesthesia    "wake up w/a start; hallucinations" (06/26/2012)   Critical Aortic Stenosis    a. 03/2003 s/p SJM #25 mech prosthetic AoV;  b. 07/2016 Echo: EF 25-30% (in setting of AF), AoV area 1.72 cm^2 (VTI), 1.75 cm^2 (Vmean).   Diverticulosis    Gouty arthritis    "have had it in both feet, ankles, right knee" (06/26/2012)   History of diverticulitis of colon    History of kidney stones    History of pancreatitis    Hypertension    Incomplete RBBB    PAF (paroxysmal atrial fibrillation) (HCC)    a. CHA2DS2VASc = 4-->chronic coumadin   in setting of mech AoV;  b. 07/2016 Recurrent AF RVR.      Dispostion: Disposition decision including need for hospitalization was considered, and patient admitted to hospital    Final Clinical Impression(s) / ED Diagnoses Final diagnoses:  Acute systolic congestive heart failure (HCC)     This chart was dictated using voice recognition software.  Despite best efforts to proofread,  errors can occur which can change the documentation meaning.    Mordecai Applebaum, MD 01/01/24 [phone]

## 2024-01-01 NOTE — Telephone Encounter (Signed)
 CSW contacted patient's home to complete psychosocial assessment and informed that paramedic is in the home and patient will be transported to [facility] for evaluation. CSW will monitor and pending on status will attempt to complete assessment at a later time. Aubry Blase, LCSW, CCSW-MCS [phone]

## 2024-01-01 NOTE — ED Notes (Signed)
 Alert, NAD, calm, interactive. Denies pain, sob, nausea or dizziness. Intermittent cough noted.

## 2024-01-02 ENCOUNTER — Inpatient Hospital Stay (HOSPITAL_COMMUNITY)

## 2024-01-02 ENCOUNTER — Encounter (HOSPITAL_COMMUNITY): Payer: Self-pay | Admitting: Internal Medicine

## 2024-01-02 DIAGNOSIS — I5021 Acute systolic (congestive) heart failure: Secondary | ICD-10-CM

## 2024-01-02 DIAGNOSIS — I5033 Acute on chronic diastolic (congestive) heart failure: Secondary | ICD-10-CM | POA: Diagnosis not present

## 2024-01-02 DIAGNOSIS — I5023 Acute on chronic systolic (congestive) heart failure: Secondary | ICD-10-CM | POA: Diagnosis not present

## 2024-01-02 LAB — CBC
HCT: 46.8 % (ref 39.0–52.0)
HCT: 46.6 % (ref 39.0–52.0)
Hemoglobin: 14.9 g/dL (ref 13.0–17.0)
Hemoglobin: 14.8 g/dL (ref 13.0–17.0)
MCH: 20.2 pg — ABNORMAL LOW (ref 26.0–34.0)
MCH: 19.9 pg — ABNORMAL LOW (ref 26.0–34.0)
MCHC: 31.8 g/dL (ref 30.0–36.0)
MCHC: 31.8 g/dL (ref 30.0–36.0)
MCV: 63.5 fL — ABNORMAL LOW (ref 80.0–100.0)
MCV: 62.8 fL — ABNORMAL LOW (ref 80.0–100.0)
Platelets: 168 10*3/uL (ref 150–400)
Platelets: 172 10*3/uL (ref 150–400)
RBC: 7.37 MIL/uL — ABNORMAL HIGH (ref 4.22–5.81)
RBC: 7.42 MIL/uL — ABNORMAL HIGH (ref 4.22–5.81)
RDW: 19.9 % — ABNORMAL HIGH (ref 11.5–15.5)
RDW: 19.8 % — ABNORMAL HIGH (ref 11.5–15.5)
WBC: 16 10*3/uL — ABNORMAL HIGH (ref 4.0–10.5)
WBC: 16.4 10*3/uL — ABNORMAL HIGH (ref 4.0–10.5)
nRBC: 1.6 % — ABNORMAL HIGH (ref 0.0–0.2)
nRBC: 3.1 % — ABNORMAL HIGH (ref 0.0–0.2)

## 2024-01-02 LAB — COOXEMETRY PANEL
Carboxyhemoglobin: 1.1 % (ref 0.5–1.5)
Methemoglobin: <0.7 % (ref 0.0–1.5)
O2 Saturation: 41.3 %
Total hemoglobin: 14.5 g/dL (ref 12.0–16.0)

## 2024-01-02 LAB — BASIC METABOLIC PANEL WITH GFR
Anion gap: 15 (ref 5–15)
BUN: 70 mg/dL — ABNORMAL HIGH (ref 8–23)
CO2: 21 mmol/L — ABNORMAL LOW (ref 22–32)
Calcium: 9.1 mg/dL (ref 8.9–10.3)
Chloride: 93 mmol/L — ABNORMAL LOW (ref 98–111)
Creatinine, Ser: 1.96 mg/dL — ABNORMAL HIGH (ref 0.61–1.24)
GFR, Estimated: 34 mL/min — ABNORMAL LOW (ref >60)
Glucose, Bld: 116 mg/dL — ABNORMAL HIGH (ref 70–99)
Potassium: 5.5 mmol/L — ABNORMAL HIGH (ref 3.5–5.1)
Sodium: 129 mmol/L — ABNORMAL LOW (ref 135–145)

## 2024-01-02 LAB — PROTIME-INR
INR: 3.1 — ABNORMAL HIGH (ref 0.8–1.2)
Prothrombin Time: 31.9 s — ABNORMAL HIGH (ref 11.4–15.2)

## 2024-01-02 LAB — MRSA NEXT GEN BY PCR, NASAL: MRSA by PCR Next Gen: DETECTED — AB

## 2024-01-02 LAB — LACTIC ACID, PLASMA: Lactic Acid, Venous: 2 mmol/L (ref 0.5–1.9)

## 2024-01-02 LAB — PROCALCITONIN: Procalcitonin: 0.33 ng/mL

## 2024-01-02 MED ORDER — MUPIROCIN 2 % EX OINT
1.0000 | TOPICAL_OINTMENT | Freq: Two times a day (BID) | CUTANEOUS | Status: AC
  Administered 2024-01-02 – 2024-01-07 (×10): 1 via NASAL
  Filled 2024-01-02: qty 22

## 2024-01-02 MED ORDER — FESOTERODINE FUMARATE ER 4 MG PO TB24
4.0000 mg | ORAL_TABLET | ORAL | Status: DC
  Administered 2024-01-04 – 2024-01-24 (×11): 4 mg via ORAL
  Filled 2024-01-02 (×13): qty 1

## 2024-01-02 MED ORDER — CHLORHEXIDINE GLUCONATE CLOTH 2 % EX PADS
6.0000 | MEDICATED_PAD | Freq: Every day | CUTANEOUS | Status: DC
  Administered 2024-01-02 – 2024-01-25 (×24): 6 via TOPICAL

## 2024-01-02 MED ORDER — SODIUM ZIRCONIUM CYCLOSILICATE 5 G PO PACK
5.0000 g | PACK | Freq: Every day | ORAL | Status: DC
  Administered 2024-01-02: 5 g via ORAL
  Filled 2024-01-02 (×2): qty 1

## 2024-01-02 MED ORDER — NOREPINEPHRINE 4 MG/250ML-% IV SOLN: 0.0000 ug/min | INTRAVENOUS | Status: DC

## 2024-01-02 MED ORDER — DAPAGLIFLOZIN PROPANEDIOL 10 MG PO TABS
10.0000 mg | ORAL_TABLET | Freq: Every day | ORAL | Status: DC
  Administered 2024-01-03 – 2024-01-20 (×18): 10 mg via ORAL
  Filled 2024-01-02 (×19): qty 1

## 2024-01-02 MED ORDER — ALLOPURINOL 300 MG PO TABS
150.0000 mg | ORAL_TABLET | Freq: Every day | ORAL | Status: DC
  Administered 2024-01-03 – 2024-01-25 (×23): 150 mg via ORAL
  Filled 2024-01-02 (×23): qty 1

## 2024-01-02 MED ORDER — NOREPINEPHRINE 16 MG/250ML-% IV SOLN
3.0000 ug/min | INTRAVENOUS | Status: DC
  Administered 2024-01-02 – 2024-01-06 (×2): 2 ug/min via INTRAVENOUS
  Filled 2024-01-02 (×2): qty 250

## 2024-01-02 MED ORDER — SODIUM CHLORIDE 0.9 % IV SOLN
250.0000 mL | INTRAVENOUS | Status: AC
  Administered 2024-01-02: 250 mL via INTRAVENOUS

## 2024-01-02 MED ORDER — WARFARIN SODIUM 1 MG PO TABS
1.0000 mg | ORAL_TABLET | Freq: Once | ORAL | Status: AC
  Administered 2024-01-02: 1 mg via ORAL
  Filled 2024-01-02: qty 1

## 2024-01-02 MED ORDER — CHLORHEXIDINE GLUCONATE CLOTH 2 % EX PADS
6.0000 | MEDICATED_PAD | Freq: Every day | CUTANEOUS | Status: AC
Start: 2024-01-03 — End: 2024-01-08
  Administered 2024-01-03: 6 via TOPICAL

## 2024-01-02 NOTE — ED Notes (Signed)
 Weaned oxygen to 2 LPM from 3 LPM per Dr Darlyn Eke verbal orders.

## 2024-01-02 NOTE — Evaluation (Signed)
 Physical Therapy Evaluation Patient Details Name: Adrian Lambert MRN: [id number] DOB: April 13, 1941 Today's Date: 01/02/2024  History of Present Illness  Pt is 83 yo presenting to [facility] ED on 6/10 due to shortness of breath. PMH: arthritis, systolic and diastolic CHF, diverticulosis, HTN, incomplete RBBB, PAF  Clinical Impression  Pt is presenting slightly below baseline level of functioning. Currently pt requires Min A to CGA for bed mobility, CGA for sit to stand and short distance gait without an AD. Pt is short of breathe after minimal activity which improved after education on purse lipped breathing. Due to pt current functional status, home set up and available assistance at home recommending skilled physical therapy services 3x/week in order to address strength, balance and functional mobility to decrease risk for falls, injury and re-hospitalization.           If plan is discharge home, recommend the following: A little help with walking and/or transfers;Help with stairs or ramp for entrance;Assist for transportation;Assistance with cooking/housework     Equipment Recommendations None recommended by PT     Functional Status Assessment Patient has had a recent decline in their functional status and demonstrates the ability to make significant improvements in function in a reasonable and predictable amount of time.     Precautions / Restrictions Precautions Precautions: Fall Recall of Precautions/Restrictions: Intact Precaution/Restrictions Comments: watch O2 sats Restrictions Weight Bearing Restrictions Per Provider Order: No      Mobility  Bed Mobility Overal bed mobility: Needs Assistance Bed Mobility: Supine to Sit, Sit to Supine     Supine to sit: Min assist Sit to supine: Contact guard assist   General bed mobility comments: Min A to get trunk to mid line and CGA for sitting to supine.    Transfers Overall transfer level: Needs assistance Equipment used:  None Transfers: Sit to/from Stand Sit to Stand: Contact guard assist           General transfer comment: CGA for safety. Pt unsteady on standing.    Ambulation/Gait Ambulation/Gait assistance: Contact guard assist Gait Distance (Feet): 15 Feet Assistive device: None Gait Pattern/deviations: Step-through pattern Gait velocity: decreased Gait velocity interpretation: <1.8 ft/sec, indicate of risk for recurrent falls   General Gait Details: step through gait pattern; slightly unsteady, CGA for safety, Limited due to O2 line today. O2 sats remained in the 90's on 2L O2 via [place]. Pt was short of breathe after short distance gait.  Stairs Stairs:  (Pt demonstrates adequate strength from sit to stand and gait to perform stairs per home set up with minimal assist)            Balance Overall balance assessment: Needs assistance Sitting-balance support: No upper extremity supported, Feet supported Sitting balance-Leahy Scale: Fair     Standing balance support: No upper extremity supported, Single extremity supported, During functional activity Standing balance-Leahy Scale: Fair Standing balance comment: intermittently using UE for balance, CGA for safety           Pertinent Vitals/Pain Pain Assessment Pain Assessment: No/denies pain    Home Living Family/patient expects to be discharged to:: Private residence Living Arrangements: Spouse/significant other;Children (lives with son) Available Help at Discharge: Family;Available 24 hours/day Type of Home: House Home Access: Stairs to enter Entrance Stairs-Rails: Right;Left Entrance Stairs-Number of Steps: 2 Alternate Level Stairs-Number of Steps: 10 goes to basement Home Layout: Two level;Able to live on main level with bedroom/bathroom Home Equipment: Rolling Walker (2 wheels);Toilet riser;Grab bars - toilet;Grab bars - tub/shower;Shower seat  Prior Function Prior Level of Function : Independent/Modified  Independent;Driving;History of Falls (last six months)             Mobility Comments: Pt reports one fall. Ind without an AD at baseline. ADLs Comments: Pt reports ind with ADL's and IADL's.     Extremity/Trunk Assessment   Upper Extremity Assessment Upper Extremity Assessment: Generalized weakness;Overall [facility] for tasks assessed    Lower Extremity Assessment Lower Extremity Assessment: Generalized weakness;Overall [facility] for tasks assessed    Cervical / Trunk Assessment Cervical / Trunk Assessment: Kyphotic  Communication   Communication Communication: No apparent difficulties    Cognition Arousal: Alert Behavior During Therapy: WFL for tasks assessed/performed   PT - Cognitive impairments: No apparent impairments       Following commands: Intact       Cueing Cueing Techniques: Verbal cues     General Comments General comments (skin integrity, edema, etc.): HR WNL throughout session. Pt gets short of breathe with limited activity with O2 sats remainining in the 90's on 2L o2 via [place]        Assessment/Plan    PT Assessment Patient needs continued PT services  PT Problem List Decreased strength;Cardiopulmonary status limiting activity;Decreased activity tolerance;Decreased balance;Decreased mobility;Decreased safety awareness       PT Treatment Interventions DME instruction;Balance training;Gait training;Stair training;Functional mobility training;Therapeutic activities;Therapeutic exercise;Patient/family education    PT Goals (Current goals can be found in the Care Plan section)  Acute Rehab PT Goals Patient Stated Goal: to return home soon PT Goal Formulation: With patient Time For Goal Achievement: 01/16/24 Potential to Achieve Goals: Good    Frequency Min 2X/week        AM-PAC PT 6 Clicks Mobility  Outcome Measure Help needed turning from your back to your side while in a flat bed without using bedrails?: A Little Help needed moving from lying on  your back to sitting on the side of a flat bed without using bedrails?: A Little Help needed moving to and from a bed to a chair (including a wheelchair)?: A Little Help needed standing up from a chair using your arms (e.g., wheelchair or bedside chair)?: A Little Help needed to walk in hospital room?: A Little Help needed climbing 3-5 steps with a railing? : A Little 6 Click Score: 18    End of Session Equipment Utilized During Treatment: Gait belt;Oxygen Activity Tolerance: Patient tolerated treatment well Patient left: in bed;with call bell/phone within reach Nurse Communication: Mobility status PT Visit Diagnosis: Unsteadiness on feet (R26.81);Muscle weakness (generalized) (M62.81)    Time: 1610-9604 PT Time Calculation (min) (ACUTE ONLY): 16 min   Charges:   PT Evaluation $PT Eval Low Complexity: 1 Low   PT General Charges $$ ACUTE PT VISIT: 1 Visit        Sloan Duncans, DPT, CLT  Acute Rehabilitation Services Office: [phone] (Secure chat preferred)   Jenice Mitts 01/02/2024, 1:30 PM

## 2024-01-02 NOTE — Progress Notes (Signed)
  Progress Note   Patient: Adrian Lambert WUX:[id number] DOB: 1941-04-06 DOA: 01/01/2024     1 DOS: the patient was seen and examined on 01/02/2024 at 9:26AM      Brief hospital course: 83 y.o. 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     Assessment and Plan: Acute on chronic systolic CHF I/Os not recorded in the ER.  Cr slightly better today.  Still on 2L, but appears comfortable. Denies dyspnea at rest.  Still with pitting edema in LE - Continue IV Lasix  - Monitor K - Strict I/Os, daily weights, telemetry  - Daily monitoring renal function - obtain Echo - Consult Cardiology due to hypotension - Continue Toprol , resume Farxiga  - Hold torsemide     Paroxysmal atrial fibrillation History of [employer] mechanical AV repair INR 3.1.  In Afib this morning, rates in the low 100s.   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  Not on O2 at baseline.  AKI on CKD IIIb  [place] 1.3-1.7.  Here up to 2.1 on admission.  Cr slightly better overnight with diuresis. - Daily BMP  Hyperkalemia - Start Lokelma  Hyponatremia Na down to 129 - Continue diuretics and monitor Na        Subjective: Denies dyspnea, still swollen, still appears out of breath.  Tired from being up all night in the ER.     Physical Exam: BP 95/68 (BP Location: Right Arm)   Pulse 94   Temp 98 F (36.7 C) (Oral)   Resp 20   Ht 5' 10 (1.778 m)   Wt 82.5 kg   SpO2 98%   BMI 26.10 kg/m   Elderly adult male, lying in bed, interactive and appropriate Tachycardic, regular, pitting edema to the midshin, JVP not visible due to body habitus Respiratory rate seems increased, lung sounds diminished bilaterally, no rales or wheezes appreciated Abdomen soft, no tenderness palpation or guarding Attention normal, affect  normal, judgment and insight appear impaired slightly but at baseline, moves upper extremities with normal strength and coordination    Data Reviewed: Cardiology consulted Basic metabolic panel shows creatinine down to 1.9 CBC shows white count 16 Sodium down to 129, potassium up to 5.5 INR 3.1 BNP greater than 3000 Echocardiogram from 1 year ago shows EF 30 to 35%  Family Communication: Wife at the bedside, daughter by phone    Disposition: Status is: Inpatient         Author: Ephriam Hashimoto, MD 01/02/2024 3:51 PM  For on call review [email or web address].

## 2024-01-02 NOTE — Progress Notes (Signed)
 1750 patient arrived on unit alert x4 on 2L [place] able to make all needs known CHG completed Central line placed xray completed waiting on line adjustment for pressors to start BP WN: at this time.  1850 family at bedside

## 2024-01-02 NOTE — Consult Note (Addendum)
 Advanced Heart Failure Team Consult Note   Primary Physician: Jimmey Mould, MD Cardiologist:  Peder Bourdon, MD  Reason for Consultation: acute on chronic systolic heart failure w/ low output   HPI:    Adrian Lambert is seen today for evaluation of acute on chronic systolic heart failure w/ low output at the request of Dr. Darlyn Eke, Internal Medicine.   83 y.o. male with a history of severe AS s/p St Jude mechanical AVR in 03/2003 on chronic coumadin , HTN, obesity, chronic systolic heart failure and h/o afib. He was hospitalized 2/18 w/ acute systolic heart failure w/ low output while in afib w/ RVR and required inotropic support w/ milrinone . Echo that hospitalization showed EF around 25%.   He had atrial fibrillation ablation with Dr. Lawana Pray in 5/18.    Echo was done in 5/18, EF up to 45% with mild diffuse hypokinesis, normal mechanical aortic valve, mildly decreased RV systolic function.     Echo was repeated in 5/19, EF 35-40%, diffuse hypokinesis, mildly decreased RV systolic function, stable mechanical aortic valve.    Echo in 11/21 showed EF up to 50-55%, normal RV size and systolic function, mechanical aortic valve looks ok, normal IVC, ascending aorta 4.2 cm. Echo in 12/21 showed EF 60-65%, mild RV enlargement, normal RV function, mechanical aortic valve mean gradient 10 mmHg, ascending aorta 4.1 cm.  MRA chest in 1/22 showed 4.3 cm ascending aorta.    Echo 1/23 showed EF 35-40%, global hypokinesis, mildly decreased RV systolic function, normal mechanical aortic valve, moderate MR, PASP 51 mmHg. With EF back down and mild volume overload, he was started on Farxiga , torsemide  increased and R/LHC arranged.   R/LHC (1/23) showed mild elevated PCWP with mild pulmonary venous hypertension, preserved CO and minimal CAD.  Echo 01/16/23 showed EF 30-35%, RV mildly reduced, mild MR, mechanical AVR stable with mean gradient 5 mmHg   Also w/ ILD, followed by pulmonology.    He  has now been admitted for a/c CHF w/ concern for low output. Hypotensive w/ low pulse pressure. SBPs upper 70s-80s. Has AKI. SCr elevated at 2. Baseline 1.5. Poor urinary response to IV Lasix . CO2 21, K elevated at 5.5. BNP 3000. NYHA Class IIIb symptoms. He is in afib w/ CVR.   Updated echo pending.   Home Medications Prior to Admission medications   Medication Sig Start Date End Date Taking? Authorizing Provider  acetaminophen  (TYLENOL ) 500 MG tablet Take 500 mg by mouth 2 (two) times daily.   Yes [provider]  albuterol  (VENTOLIN  HFA) 108 (90 Base) MCG/ACT inhaler Inhale 2 puffs into the lungs every 4 (four) hours.   Yes [provider]  allopurinol  (ZYLOPRIM ) 300 MG tablet Take 150 mg by mouth daily.   Yes [provider]  aspirin  EC 81 MG tablet Take 81 mg by mouth daily. Swallow whole.   Yes [provider]  clobetasol cream (TEMOVATE) 0.05 % Apply 1 Application topically 2 (two) times daily.   Yes [provider]  colchicine  0.6 MG tablet Take 1 tablet (0.6 mg total) by mouth daily as needed (for gout). 10/19/22  Yes Gonfa, Taye T, MD  FARXIGA  10 MG TABS tablet TAKE 1 TABLET BY MOUTH EVERY DAY 12/18/23  Yes McLean, Dalton S, MD  FIBER PO Take 2 capsules by mouth 2 (two) times daily.   Yes [provider]  folic acid  (FOLVITE ) 1 MG tablet TAKE 1 TABLET(1 MG) BY MOUTH DAILY 01/07/21  Yes Gray Layman  R, MD  metoprolol  succinate (TOPROL -XL) 25 MG 24 hr tablet TAKE 1 TABLET BY MOUTH EVERYDAY AT BEDTIME 07/16/23  Yes McLean, Dalton S, MD  potassium chloride  SA (KLOR-CON  M) 20 MEQ tablet Take 1 tablet (20 mEq total) by mouth daily. 12/27/23  Yes Clegg, Amy D, NP  tamsulosin  (FLOMAX ) 0.4 MG CAPS capsule Take 1 capsule (0.4 mg total) by mouth every other day. 07/05/23  Yes Stoneking, Ponce Brisker., MD  tolterodine  (DETROL  LA) 4 MG 24 hr capsule Take 1 capsule (4 mg total) by mouth every other day. Alternating days with the tamsulosin  07/05/23  Yes  Stoneking, Ponce Brisker., MD  torsemide  (DEMADEX ) 20 MG tablet Take 3 tablets (60 mg total) by mouth daily. 12/27/23  Yes Clegg, Amy D, NP  warfarin (COUMADIN ) 5 MG tablet Take 0.5-1 tablets (2.5-5 mg total) by mouth See admin instructions. 1/19 and 1/20  take 7.5mg  (1 and 1/2 tab) then take 1 tab (5mg ) daily until seen by MD  Take 1 tablet (5 mg) by mouth on Sundays, Mondays, Wednesdays, Thursdays & Saturdays in the evening. Take 0.5 tablet (2.5 mg) by mouth on Tuesdays & Fridays in the evening. Patient taking differently: Take 2.5-5 mg by mouth See admin instructions. AS directed by PCP 2.5 mg Daily 08/11/21  Yes McLean, Dalton S, MD  docusate sodium  (COLACE) 100 MG capsule Take 100 mg by mouth 2 (two) times daily. Patient not taking: Reported on 01/01/2024    [provider]  polyethylene glycol powder (GLYCOLAX /MIRALAX ) 17 GM/SCOOP powder Take 17 g by mouth 2 (two) times daily as needed for moderate constipation or mild constipation. Patient not taking: Reported on 12/26/2023 10/19/22   Gonfa, Taye T, MD    Past Medical History: Past Medical History:  Diagnosis Date   Anticoagulant long-term use    Arthritis    probably in my thumbs (06/26/2012)   Bifascicular block    Chronic combined systolic and diastolic CHF (congestive heart failure) (HCC)    a. 01/2016 Echo: EF 45-50%, gr2 DD, inflat AK (poor acoustic windows);  b. 07/2016 Echo: EF 25-30%, mild LVH, PASP (pt in Afib).   Complication of anesthesia    wake up w/a start; hallucinations (06/26/2012)   Critical Aortic Stenosis    a. 03/2003 s/p SJM #25 mech prosthetic AoV;  b. 07/2016 Echo: EF 25-30% (in setting of AF), AoV area 1.72 cm^2 (VTI), 1.75 cm^2 (Vmean).   Diverticulosis    Gouty arthritis    have had it in both feet, ankles, right knee (06/26/2012)   History of diverticulitis of colon    History of kidney stones    History of pancreatitis    Hypertension    Incomplete RBBB    PAF (paroxysmal atrial fibrillation)  (HCC)    a. CHA2DS2VASc = 4-->chronic coumadin  in setting of mech AoV;  b. 07/2016 Recurrent AF RVR.    Past Surgical History: 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  DR GERHARDT   #14mm ST JUDE MECHNICAL PROSTHESIS   ATRIAL FIBRILLATION ABLATION N/A 11/28/2016   Procedure: Atrial Fibrillation Ablation;  Surgeon: Lei Pump, MD;  Location: [facility] INVASIVE CV LAB;  Service: Cardiovascular;  Laterality: N/A;   CARDIOVERSION N/A 08/18/2016   Procedure: CARDIOVERSION;  Surgeon: Lake Pilgrim, MD;  Location: [facility] ENDOSCOPY;  Service: Cardiovascular;  Laterality: N/A;   CARDIOVERSION N/A 08/28/2016   Procedure: CARDIOVERSION;  Surgeon: Darlis Eisenmenger, MD;  Location: [facility] ENDOSCOPY;  Service: Cardiovascular;  Laterality: N/A;   CARDIOVERSION N/A 04/05/2022   Procedure: CARDIOVERSION;  Surgeon: Darlis Eisenmenger, MD;  Location: [facility] ENDOSCOPY;  Service: Cardiovascular;  Laterality: N/A;   CATARACT EXTRACTION W/ INTRAOCULAR LENS  IMPLANT, BILATERAL  RIGHT 2010/  LEFT DEC 2013   COLONOSCOPY WITH PROPOFOL  N/A 02/22/2022   Procedure: COLONOSCOPY WITH PROPOFOL ;  Surgeon: Ozell Blunt, MD;  Location: [facility] ENDOSCOPY;  Service: Gastroenterology;  Laterality: N/A;   HEMICOLECTOMY  10/06/2003   Laparoscopic-assisted left hemicolectomy for diverticulitis   LAPAROSCOPIC CHOLECYSTECTOMY  07-23-2006   POLYPECTOMY  02/22/2022   Procedure: POLYPECTOMY;  Surgeon: Ozell Blunt, MD;  Location: [facility] ENDOSCOPY;  Service: Gastroenterology;;   PROSTATE BIOPSY  10/09/2012   gleason 3+4=7   RADIOACTIVE SEED IMPLANT N/A 01/09/2013   Procedure: RADIOACTIVE SEED IMPLANT;  Surgeon: Trent Frizzle, MD;  Location: [facility];  Service: Urology;  Laterality: N/A;   RIGHT/LEFT HEART CATH AND CORONARY ANGIOGRAPHY N/A 08/11/2021   Procedure: RIGHT/LEFT HEART CATH AND CORONARY ANGIOGRAPHY;  Surgeon: Darlis Eisenmenger, MD;  Location: [facility] INVASIVE CV LAB;  Service: Cardiovascular;   Laterality: N/A;   TEE WITHOUT CARDIOVERSION N/A 08/18/2016   Procedure: TRANSESOPHAGEAL ECHOCARDIOGRAM (TEE);  Surgeon: Lake Pilgrim, MD;  Location: [facility] ENDOSCOPY;  Service: Cardiovascular;  Laterality: N/A;   TRANSTHORACIC ECHOCARDIOGRAM  04-28-2008   MILD LVH / NORMAL LSF/ NORMAL AORTIC VALVE MECHANICAL PROSTHESIS FUNCTION/ MILD LAE/ EF 55-60%    Family History: Family History  Problem Relation Age of Onset   Heart failure Mother    Diabetes Mother    Prostate cancer Father     Social History: Social History   Socioeconomic History   Marital status: Married    Spouse name: Not on file   Number of children: 4   Years of education: Not on file   Highest education level: Not on file  Occupational History   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, 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    Comment: retired/[employer]  Tobacco Use   Smoking status: Never   Smokeless tobacco: Never  Vaping Use   Vaping status: Never Used  Substance and Sexual Activity   Alcohol use: Yes    Alcohol/week: 3.0 standard drinks of alcohol    Types: 3 Cans of beer per week    Comment:  may have 2-3 beers on the weekend   Drug use: No   Sexual activity: Not on file  Other Topics Concern   Not on file  Social History Narrative   Not on file   Social Drivers of Health   Financial Resource Strain: Not on file  Food Insecurity: No Food Insecurity (01/02/2024)   Hunger Vital Sign    Worried About Running Out of Food in the Last Year: Never true    Ran Out of Food in the Last Year: Never true  Transportation Needs: No Transportation Needs (01/02/2024)   PRAPARE - Administrator, Civil Service (Medical): No    Lack of Transportation (Non-Medical): No  Physical Activity: Not on file  Stress: Not on file  Social Connections: Moderately Isolated (01/02/2024)   Social Connection and Isolation Panel [NHANES]    Frequency of Communication with Friends and Family: More than three times a week    Frequency of Social Gatherings with Friends and Family:  Three times a week    Attends Religious Services: Never    Active Member of Clubs or Organizations: No    Attends Banker Meetings: Never    Marital Status: Married    Allergies:  Allergies  Allergen Reactions   Celecoxib Rash    Other Reaction(s): Unknown    Objective:    Vital Signs:   Temp:  [96.8 F (36 C)-98 F (36.7 C)] 96.8 F (36 C) (06/11 1556) Pulse Rate:  [62-192] 62 (06/11 1556) Resp:  [10-32] 25 (06/11 1556) BP: (86-106)/(65-87) 86/72 (06/11 1556) SpO2:  [90 %-100 %] 96 % (06/11 1556) FiO2 (%):  [100 %] 100 % (06/10 1805) Weight:  [82.5 kg] 82.5 kg (06/11 1405) Last BM Date : 01/01/24  Weight change: Filed Weights   01/02/24 1405  Weight: 82.5 kg    Intake/Output:   Intake/Output Summary (Last 24 hours) at 01/02/2024 1634 Last data filed at 01/02/2024 1600 Gross per 24 hour  Intake --  Output 625 ml  Net -625 ml      Physical Exam    General:  fatigued appearing, elderly male w/ mild conversational dyspnea  HEENT: normal Neck: supple. JVP elevated to jaw . Carotids 2+ bilat; no bruits. No lymphadenopathy or thyromegaly appreciated. Cor: PMI nondisplaced. Irregularly irregular rate and rhythm  Lungs: decreased BS at the bases w/ faint bibasilar crackles  Abdomen: soft, nontender, nondistended. No hepatosplenomegaly. No bruits or masses. Good bowel sounds. Extremities: no cyanosis, clubbing, rash, 1+ b/l LE edema, chronic venous stasis dermatitis  Neuro: alert & orientedx3, cranial nerves grossly intact. moves all 4 extremities w/o difficulty. Affect pleasant   Telemetry   Afib w/ CVR 80s, personally reviewed   EKG    No new EKG to review   Labs   Basic Metabolic Panel: Recent Labs  Lab 01/01/24 1124 01/01/24 1336 01/02/24 0426  NA 132*  --  129*  K 5.2*  --  5.5*  CL 92*  --  93*  CO2 24  --  21*  GLUCOSE 190*  --  116*  BUN 60*  --  70*  CREATININE 2.14* 2.00* 1.96*  CALCIUM 9.0  --  9.1  MG 2.8*  --   --      Liver Function Tests: Recent Labs  Lab 01/01/24 1124  AST 90*  ALT 73*  ALKPHOS 145*  BILITOT 3.4*  PROT 6.7  ALBUMIN 3.7   No results for input(s): LIPASE, AMYLASE in the last 168 hours. No results for input(s): AMMONIA in the last 168 hours.  CBC: Recent Labs  Lab 01/01/24 1124 01/02/24 0426 01/02/24 1441  WBC 15.4* 16.0* 16.4*  NEUTROABS 13.3*  --   --   HGB 15.0 14.9 14.8  HCT 48.1 46.8 46.6  MCV 64.7* 63.5* 62.8*  PLT 162 168 172    Cardiac Enzymes: No results for input(s): CKTOTAL, CKMB, CKMBINDEX, TROPONINI in the last 168 hours.  BNP: BNP (last 3 results) Recent Labs    12/10/23 1619 12/25/23 0935 01/01/24 1124  BNP 3,213.0* 2,855.5* 3,066.5*    ProBNP (last 3 results) No results for input(s): PROBNP in the last 8760 hours.   CBG: No results for input(s): GLUCAP in the last 168 hours.  Coagulation Studies: Recent Labs    01/01/24 1650 01/02/24 0426  LABPROT 30.7* 31.9*  INR 2.9* 3.1*     Imaging   No results found.   Medications:     Current Medications:  [START ON 01/03/2024] allopurinol   150 mg Oral Daily   aspirin  EC  81 mg Oral Daily   [START ON 01/03/2024] dapagliflozin  propanediol  10 mg Oral Daily   [START ON 01/04/2024] fesoterodine   4 mg Oral QODAY   furosemide   80 mg Intravenous BID  metoprolol  succinate  25 mg Oral Daily   sodium zirconium cyclosilicate  5 g Oral Daily   tamsulosin   0.4 mg Oral QODAY   warfarin  1 mg Oral ONCE-1600   Warfarin - Pharmacist Dosing Inpatient   Does not apply q1600    Infusions:     Patient Profile   83 y/o male w/ chronic systolic heart failure, h/o PAF/AFL and mechanical aortic valve replacement admitted w/ a/c CHF w/ low output and recurrent AF w/ CVR.   Assessment/Plan   1. Acute on chronic systolic CHF: EF on TEE in 1/18 20-25%, mechanical aortic valve ok. Fall in EF may have been related to tachycardia-mediated CMP with rapid atrial fibrillation (echo  7/17 with EF 45-50%).  He required milrinone  to assist diuresis during 1/18-2/18 admission.  Now that he is back in NSR s/p atrial fibrillation ablation, echo in 5/18 showed EF back up to 45%.  Echo in 5/19 showed mildly lower systolic function, EF 35-40%. Echo in 12/21 showed EF up to 60-65%.  However, echo 1/23 showed EF back down to 35-40% with mild RV dysfunction.  Coronary angiography in 1/23 showed nonobstructive mild CAD. Echo 6/24 showed EF 30-35%, RV mildly reduced.  - Hypervolemic. Concern for low output w/ hypotension and low pulse pressure, AKI and poor response to IV Lasix . CO2 21. He will need inotropic and pressor support to aid in diuresis. Transfer to 2H. Will place CVC and start on NE support. Continue IV Lasix  80 mg bid + add 2.5 of metolazone  after NE is initiated - check LA. Follow co-ox and CVPs after establishment of CVC   - Stop ? blocker (metoprolol )  - Hypotension and AKI limiting other GDMT  - Hold entresto , toprol  XL, and spiro  - Continue Farxiga  10 mg daily.  - Update echo   2. Mechanical aortic valve: Continue warfarin and ASA 81. INR followed by PCP. Valve looked ok most recent echo.  - He will need antibiotic prophylaxis with dental work.   3. Atrial fibrillation/atrial flutter: Admitted 1/18 with atrial fibrillation with RVR and CHF.  EF down to 20-25%.  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  Need to keep him in NSR. DCCV during 2/18 admission and now s/p atrial fibrillation ablation.  In AFL at 9/23 follow up, DCCV to NSR. Appears to be back in Afib on admission  - Hold toprol  with hypotension and low output  - Off amiodarone .  ? H/o ILD. Luckily he is currently rate controlled but will need to monitor w/ inotropes  - Continue warfarin.  - will need TEE/DCCV after diuresis   4. Ascending aortic aneurysm: 4.3 cm on 4/24 MRA chest.  5. AKI on CKD2: Creatinine 2.14 on admit. Baseline 1.5. Suspect  cardio-renal.  - support CO and BP w/ NE per above - diuresis w/ IV Lasix  - follow BMP   6. Hyperkalemia: K 5.5. Given Lokelma. CTM daily.   7. Elevated LFTs - AST 90, ALT 73, Tbili 3.4 - likely 2/2 low output/hepatic congestion - inotropes + diuretics per above - follow trends   8. ID: +leukocytosis, WBC 16K. PCT elevated at 0.33 but also in the setting of AKI. Pt denies infectious symptoms.  - d/w IM, if continued rise in WBC or development of fevers will start abx  Length of Stay: 1  Ruddy Corral, PA-C  01/02/2024, 4:34 PM    Advanced Heart Failure Team Pager [phone] (M-F; 7a -  5p)  Please contact [facility] for night-coverage after hours (4p -7a ) and weekends on [email or web address]  Agree with above  83 y/o male with systolic HF due to NICM, PAF, mAVR, CKD admitted with decompensated HF in setting of recurrent AF  Has now developed output symptoms with hypotension, AKI and diuretic resistance,   Currently denies CP or SOB.   General:  Elderly male No resp difficulty HEENT: normal Neck: supple.JVP to jaw Carotids 2+ bilat; no bruits. No lymphadenopathy or thryomegaly appreciated. Cor: Irreg mech s2 Lungs: clear Abdomen: soft, nontender, nondistended. No hepatosplenomegaly. No bruits or masses. Good bowel sounds. Extremities: no cyanosis, clubbing, rash, 2+ edema Neuro: alert & orientedx3, cranial nerves grossly intact. moves all 4 extremities w/o difficulty. Affect pleasant  He has developed shock physiology. Will move to ICU. Place central access for inotropic support. Follow CVP/co-ox.   Will update echo. Once stabilized and diuresed will need cardioversion. (INRs therapeutic)  CRITICAL CARE Performed by: Jules Oar  Total critical care time: 45 minutes  Critical care time was exclusive of separately billable procedures and treating other patients.  Critical care was necessary to treat or prevent imminent or life-threatening  deterioration.  Critical care was time spent personally by me (independent of midlevel providers or residents) on the following activities: development of treatment plan with patient and/or surrogate as well as nursing, discussions with consultants, evaluation of patient's response to treatment, examination of patient, obtaining history from patient or surrogate, ordering and performing treatments and interventions, ordering and review of laboratory studies, ordering and review of radiographic studies, pulse oximetry and re-evaluation of patient's condition.  Jules Oar, MD  6:01 PM

## 2024-01-02 NOTE — Procedures (Signed)
 Central Venous Catheter Insertion Procedure Note  Adrian Lambert  [id number]  [birth year]  Date:01/02/24  Time:6:29 PM   Provider Performing:Hewitt Garner Josephus Nida   Procedure: Insertion of Non-tunneled Central Venous [phone]) with US  guidance ([id number])   Indication(s) Medication administration  Consent Risks of the procedure as well as the alternatives and risks of each were explained to the patient and/or caregiver.  Consent for the procedure was obtained and is signed in the bedside chart  Anesthesia Topical only with 1% lidocaine    Timeout Verified patient identification, verified procedure, site/side was marked, verified correct patient position, special equipment/implants available, medications/allergies/relevant history reviewed, required imaging and test results available.  Sterile Technique Maximal sterile technique including full sterile barrier drape, hand hygiene, sterile gown, sterile gloves, mask, hair covering, sterile ultrasound probe cover (if used).  Procedure Description Area of catheter insertion was cleaned with chlorhexidine and draped in sterile fashion.  With real-time ultrasound guidance a central venous catheter was placed into the left internal jugular vein. Nonpulsatile blood flow and easy flushing noted in all ports.  The catheter was sutured in place and sterile dressing applied.  Complications/Tolerance None; patient tolerated the procedure well. Chest X-ray is ordered to verify placement for internal jugular or subclavian cannulation.   Chest x-ray is not ordered for femoral cannulation.  EBL Minimal  Specimen(s) None

## 2024-01-02 NOTE — Progress Notes (Signed)
 Heart Failure Navigator Progress Note  Assessed for Heart & Vascular TOC clinic readiness.  Patient does not meet criteria due to Advanced Heart Failure Team patient of Dr. Shirlee Latch.   Navigator will sign off at this time.    Rhae Hammock, BSN, Scientist, clinical (histocompatibility and immunogenetics) Only

## 2024-01-02 NOTE — ED Notes (Signed)
 Spouse at bedside. Requests to speak with hospitalist.  Dr Darlyn Eke notified.

## 2024-01-02 NOTE — Progress Notes (Signed)
 ANTICOAGULATION CONSULT NOTE  Pharmacy Consult for Warfarin Indication: afib with mechanical AVR  Allergies  Allergen Reactions   Celecoxib Rash    Other Reaction(s): Unknown    Patient Measurements: Height: 5' 10 (177.8 cm) IBW/kg (Calculated) : 73  Vital Signs: Temp: 97.4 F (36.3 C) (06/11 0801) Temp Source: Oral (06/11 0801) BP: 105/77 (06/11 1020) Pulse Rate: 98 (06/11 1045)  Labs: Recent Labs    01/01/24 1124 01/01/24 1336 01/01/24 1650 01/02/24 0426  HGB 15.0  --   --  14.9  HCT 48.1  --   --  46.8  PLT 162  --   --  168  LABPROT  --   --  30.7* 31.9*  INR  --   --  2.9* 3.1*  CREATININE 2.14* 2.00*  --  1.96*  TROPONINIHS 38* 41*  --   --     Estimated Creatinine Clearance: 30 mL/min (A) (by C-G formula based on SCr of 1.96 mg/dL (H)).   Medical History: Past Medical History:  Diagnosis Date   Anticoagulant long-term use    Arthritis    probably in my thumbs (06/26/2012)   Bifascicular block    Chronic combined systolic and diastolic CHF (congestive heart failure) (HCC)    a. 01/2016 Echo: EF 45-50%, gr2 DD, inflat AK (poor acoustic windows);  b. 07/2016 Echo: EF 25-30%, mild LVH, PASP (pt in Afib).   Complication of anesthesia    wake up w/a start; hallucinations (06/26/2012)   Critical Aortic Stenosis    a. 03/2003 s/p SJM #25 mech prosthetic AoV;  b. 07/2016 Echo: EF 25-30% (in setting of AF), AoV area 1.72 cm^2 (VTI), 1.75 cm^2 (Vmean).   Diverticulosis    Gouty arthritis    have had it in both feet, ankles, right knee (06/26/2012)   History of diverticulitis of colon    History of kidney stones    History of pancreatitis    Hypertension    Incomplete RBBB    PAF (paroxysmal atrial fibrillation) (HCC)    a. CHA2DS2VASc = 4-->chronic coumadin  in setting of mech AoV;  b. 07/2016 Recurrent AF RVR.    Medications:  (Not in a hospital admission)  Scheduled:   aspirin  EC  81 mg Oral Daily   furosemide   80 mg Intravenous BID    metoprolol  succinate  25 mg Oral Daily   sodium zirconium cyclosilicate  5 g Oral Daily   tamsulosin   0.4 mg Oral QODAY   Warfarin - Pharmacist Dosing Inpatient   Does not apply q1600   Infusions:  PRN:   Assessment: 83 yo male presenting with worsening shortness of breath. On warfarin PTA for afib with mechanical AVR.    Per patient, dose is 5 mg daily, poor historian. Upon chart review patient should be on 2.5 mg once daily on Monday, Tuesday, Thursday, Friday, and Sunday and 1 mg on Wednesday and Saturday per last PCP note on 12/18/2023. Goal INR is 2.5-3.5.   INR today is 3.1 which is therapeutic. CBC remains stable.  Goal of Therapy:  INR 2.5-3.5 Monitor platelets by anticoagulation protocol: Yes   Plan:  Will give 1 mg warfarin today based on PTA dose Monitor for s/s of hemorrhage, daily INR, CBC Watch for new DDIs  Thank you for involving pharmacy in the patient's care.   Barbra Boone, PharmD PGY1 Acute Care Pharmacy Resident  01/02/2024 12:51 PM

## 2024-01-02 NOTE — Progress Notes (Signed)
 PCCM Interval Progress Note:  Presented to bedside for malpositioned left internal jugular CVC.  Evaluated line at bedside and reviewed CXR. Estimated based on CXR that CVC would need to be retracted ~8cm and readvanced inferiorly. I discussed replacement of central line over guidewire with patient's family member, Adrian Lambert, at bedside; he was in agreement with procedure.  Carefully removed prior CVC dressing and clipped prior sutures x 2 (removed). Existing exposed line and area of line insertion site was prepped with chlorhexidine x 3 and re-draped. Existing line was retracted ~5cm. New CVC was prepped, pressure caps were removed and guidewire from new CVC kit was advanced through the brown distal port until PVCs were noted. Existing CVC was then removed and a new CVC was advanced over the guidewire through the existing site without issue. Local anesthesia with lidocaine  was repeated for re-suturing and sutures x 2 were replaced. New CHG gel central line dressing was placed and repeat CXR obtained with confirmed satisfactory positioning of the new left internal jugular CVC.  Notified patient's RN that line can be utilized; requested Co-ox/LA and CVP.  Star East, PA-C [facility] 01/02/24 9:27 PM  Please see [email or web address] for pager details.  From 7A-7P if no response, please call [phone] After hours, please call ELink [phone]

## 2024-01-02 NOTE — ED Notes (Signed)
 Called transport to take pt to the floor. Unable to take pt at this time.  Carolynne Citron, charge RN made aware

## 2024-01-02 NOTE — Hospital Course (Addendum)
 83 y.o. 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

## 2024-01-02 NOTE — Progress Notes (Signed)
 01/02/2024 Malpositioned line noted going back up R [place]/IJ, night team will pull back, fine to draw coox from  Ardelle Kos MD PCCM

## 2024-01-02 NOTE — Plan of Care (Signed)
  Problem: Education: Goal: Knowledge of General Education information will improve Description: Including pain rating scale, medication(s)/side effects and non-pharmacologic comfort measures Outcome: Progressing   Problem: Health Behavior/Discharge Planning: Goal: Ability to manage health-related needs will improve Outcome: Progressing   Problem: Clinical Measurements: Goal: Respiratory complications will improve Outcome: Progressing   

## 2024-01-03 ENCOUNTER — Inpatient Hospital Stay (HOSPITAL_COMMUNITY)

## 2024-01-03 DIAGNOSIS — J810 Acute pulmonary edema: Secondary | ICD-10-CM

## 2024-01-03 DIAGNOSIS — I5021 Acute systolic (congestive) heart failure: Secondary | ICD-10-CM

## 2024-01-03 DIAGNOSIS — I480 Paroxysmal atrial fibrillation: Secondary | ICD-10-CM | POA: Diagnosis not present

## 2024-01-03 DIAGNOSIS — R570 Cardiogenic shock: Secondary | ICD-10-CM | POA: Diagnosis not present

## 2024-01-03 DIAGNOSIS — N179 Acute kidney failure, unspecified: Secondary | ICD-10-CM

## 2024-01-03 DIAGNOSIS — J9601 Acute respiratory failure with hypoxia: Secondary | ICD-10-CM | POA: Diagnosis not present

## 2024-01-03 DIAGNOSIS — I5023 Acute on chronic systolic (congestive) heart failure: Secondary | ICD-10-CM | POA: Diagnosis not present

## 2024-01-03 DIAGNOSIS — N1832 Chronic kidney disease, stage 3b: Secondary | ICD-10-CM

## 2024-01-03 DIAGNOSIS — K7200 Acute and subacute hepatic failure without coma: Secondary | ICD-10-CM

## 2024-01-03 LAB — PROTIME-INR
INR: 3 — ABNORMAL HIGH (ref 0.8–1.2)
Prothrombin Time: 31.1 s — ABNORMAL HIGH (ref 11.4–15.2)

## 2024-01-03 LAB — CBC
HCT: 45.9 % (ref 39.0–52.0)
Hemoglobin: 14.9 g/dL (ref 13.0–17.0)
MCH: 20.2 pg — ABNORMAL LOW (ref 26.0–34.0)
MCHC: 32.5 g/dL (ref 30.0–36.0)
MCV: 62.2 fL — ABNORMAL LOW (ref 80.0–100.0)
Platelets: 180 10*3/uL (ref 150–400)
RBC: 7.38 MIL/uL — ABNORMAL HIGH (ref 4.22–5.81)
RDW: 19.9 % — ABNORMAL HIGH (ref 11.5–15.5)
WBC: 18.2 10*3/uL — ABNORMAL HIGH (ref 4.0–10.5)
nRBC: 3.8 % — ABNORMAL HIGH (ref 0.0–0.2)

## 2024-01-03 LAB — COOXEMETRY PANEL
Carboxyhemoglobin: 1.4 % (ref 0.5–1.5)
Methemoglobin: 0.9 % (ref 0.0–1.5)
O2 Saturation: 55.1 %
Total hemoglobin: 15.3 g/dL (ref 12.0–16.0)

## 2024-01-03 LAB — POCT I-STAT EG7
Acid-Base Excess: 2 mmol/L (ref 0.0–2.0)
Bicarbonate: 26.2 mmol/L (ref 20.0–28.0)
Calcium, Ion: 1.07 mmol/L — ABNORMAL LOW (ref 1.15–1.40)
HCT: 50 % (ref 39.0–52.0)
Hemoglobin: 17 g/dL (ref 13.0–17.0)
O2 Saturation: 32 %
Patient temperature: 97.7
Potassium: 4.4 mmol/L (ref 3.5–5.1)
Sodium: 123 mmol/L — ABNORMAL LOW (ref 135–145)
TCO2: 27 mmol/L (ref 22–32)
pCO2, Ven: 39.8 mmHg — ABNORMAL LOW (ref 44–60)
pH, Ven: 7.425 (ref 7.25–7.43)
pO2, Ven: 19 mmHg — CL (ref 32–45)

## 2024-01-03 LAB — ECHOCARDIOGRAM COMPLETE
AV Mean grad: 5.6 mmHg
Calc EF: 15.4 %
Height: 70 in
S' Lateral: 5.6 cm
Single Plane A2C EF: 14.9 %
Single Plane A4C EF: 28.4 %
Weight: 2903.02 [oz_av]

## 2024-01-03 LAB — BASIC METABOLIC PANEL WITH GFR
Anion gap: 15 (ref 5–15)
BUN: 81 mg/dL — ABNORMAL HIGH (ref 8–23)
CO2: 25 mmol/L (ref 22–32)
Calcium: 8.8 mg/dL — ABNORMAL LOW (ref 8.9–10.3)
Chloride: 86 mmol/L — ABNORMAL LOW (ref 98–111)
Creatinine, Ser: 2.01 mg/dL — ABNORMAL HIGH (ref 0.61–1.24)
GFR, Estimated: 33 mL/min — ABNORMAL LOW (ref >60)
Glucose, Bld: 139 mg/dL — ABNORMAL HIGH (ref 70–99)
Potassium: 4.4 mmol/L (ref 3.5–5.1)
Sodium: 126 mmol/L — ABNORMAL LOW (ref 135–145)

## 2024-01-03 LAB — HEPATIC FUNCTION PANEL
ALT: 124 U/L — ABNORMAL HIGH (ref 0–44)
AST: 110 U/L — ABNORMAL HIGH (ref 15–41)
Albumin: 3.4 g/dL — ABNORMAL LOW (ref 3.5–5.0)
Alkaline Phosphatase: 158 U/L — ABNORMAL HIGH (ref 38–126)
Bilirubin, Direct: 1.2 mg/dL — ABNORMAL HIGH (ref 0.0–0.2)
Indirect Bilirubin: 2 mg/dL — ABNORMAL HIGH (ref 0.3–0.9)
Total Bilirubin: 3.2 mg/dL — ABNORMAL HIGH (ref 0.0–1.2)
Total Protein: 6.4 g/dL — ABNORMAL LOW (ref 6.5–8.1)

## 2024-01-03 LAB — MAGNESIUM: Magnesium: 2.6 mg/dL — ABNORMAL HIGH (ref 1.7–2.4)

## 2024-01-03 LAB — CG4 I-STAT (LACTIC ACID)
Lactic Acid, Venous: 2.1 mmol/L (ref 0.5–1.9)
Lactic Acid, Venous: 1.5 mmol/L (ref 0.5–1.9)

## 2024-01-03 MED ORDER — FUROSEMIDE 10 MG/ML IJ SOLN
20.0000 mg/h | INTRAVENOUS | Status: DC
  Administered 2024-01-03 – 2024-01-05 (×5): 20 mg/h via INTRAVENOUS
  Filled 2024-01-03 (×5): qty 20

## 2024-01-03 MED ORDER — MILRINONE LACTATE IN DEXTROSE 20-5 MG/100ML-% IV SOLN: 0.2500 ug/kg/min | INTRAVENOUS | Status: DC

## 2024-01-03 MED ORDER — WARFARIN SODIUM 2.5 MG PO TABS
2.5000 mg | ORAL_TABLET | Freq: Once | ORAL | Status: AC
  Administered 2024-01-03: 2.5 mg via ORAL
  Filled 2024-01-03: qty 1

## 2024-01-03 MED ORDER — MILRINONE LACTATE IN DEXTROSE 20-5 MG/100ML-% IV SOLN: 0.1250 ug/kg/min | INTRAVENOUS | Status: DC

## 2024-01-03 MED ORDER — MILRINONE LACTATE IN DEXTROSE 20-5 MG/100ML-% IV SOLN
0.1250 ug/kg/min | INTRAVENOUS | Status: DC
  Administered 2024-01-03: 0.125 ug/kg/min via INTRAVENOUS
  Filled 2024-01-03: qty 100

## 2024-01-03 MED ORDER — AMIODARONE HCL IN DEXTROSE 360-4.14 MG/200ML-% IV SOLN
30.0000 mg/h | INTRAVENOUS | Status: DC
  Administered 2024-01-03: 60 mg/h via INTRAVENOUS
  Administered 2024-01-03: 30 mg/h via INTRAVENOUS
  Administered 2024-01-03 – 2024-01-04 (×3): 60 mg/h via INTRAVENOUS
  Administered 2024-01-04: 30 mg/h via INTRAVENOUS
  Administered 2024-01-05 – 2024-01-11 (×25): 60 mg/h via INTRAVENOUS
  Filled 2024-01-03 (×15): qty 200
  Filled 2024-01-03: qty 400
  Filled 2024-01-03 (×14): qty 200

## 2024-01-03 MED ORDER — PERFLUTREN LIPID MICROSPHERE
1.0000 mL | INTRAVENOUS | Status: AC | PRN
  Administered 2024-01-03: 3 mL via INTRAVENOUS

## 2024-01-03 MED ORDER — MILRINONE LACTATE IN DEXTROSE 20-5 MG/100ML-% IV SOLN
0.1250 ug/kg/min | INTRAVENOUS | Status: DC
  Administered 2024-01-03 – 2024-01-07 (×6): 0.2 ug/kg/min via INTRAVENOUS
  Administered 2024-01-08 – 2024-01-09 (×4): 0.25 ug/kg/min via INTRAVENOUS
  Administered 2024-01-10: 0.125 ug/kg/min via INTRAVENOUS
  Filled 2024-01-03 (×12): qty 100

## 2024-01-03 NOTE — Evaluation (Signed)
 Occupational Therapy Evaluation Patient Details Name: Adrian Lambert MRN: [id number] DOB: [birth year] Today's Date: 01/03/2024   History of Present Illness   Pt is 83 yo presenting to [facility] ED on 6/10 due to shortness of breath Developed cardiogenic shock, progressive hypotension and signs of hyperperfusion. Admitted to ICU for ionotropes. PMH: arthritis, systolic and diastolic CHF, diverticulosis, HTN, incomplete RBBB, PAF     Clinical Impressions Pt walks without AD. His son reports he hasn't driven much in the last month as he began not feeling well. Pt is typically independent in self care and lives with is wife and son. Pt presents with generalized weakness and impaired standing balance. Limited session to bed to chair due to HR and RN's request. Completes supine to sit with CGA and transfers with min assist. Pt fatigues easily. He needs set up to max assist for ADLs. Recommend return home wth HHOT upon discharge. Will follow acutely.      If plan is discharge home, recommend the following:   A little help with walking and/or transfers;A lot of help with bathing/dressing/bathroom;Assistance with cooking/housework;Assist for transportation;Help with stairs or ramp for entrance     Functional Status Assessment   Patient has had a recent decline in their functional status and demonstrates the ability to make significant improvements in function in a reasonable and predictable amount of time.     Equipment Recommendations   None recommended by OT     Recommendations for Other Services         Precautions/Restrictions   Precautions Precautions: Fall Recall of Precautions/Restrictions: Intact Precaution/Restrictions Comments: watch O2 sats, HR Restrictions Weight Bearing Restrictions Per Provider Order: No     Mobility Bed Mobility Overal bed mobility: Needs Assistance Bed Mobility: Supine to Sit     Supine to sit: Contact guard, +2 for safety/equipment      General bed mobility comments: increased time, HOB up    Transfers Overall transfer level: Needs assistance Equipment used: None Transfers: Sit to/from Stand, Bed to chair/wheelchair/BSC Sit to Stand: Min assist     Step pivot transfers: Min assist     General transfer comment: for stability and multiple lines, increased time      Balance Overall balance assessment: Needs assistance   Sitting balance-Leahy Scale: Fair     Standing balance support: No upper extremity supported, Single extremity supported, During functional activity Standing balance-Leahy Scale: Poor Standing balance comment: pt seeking stability with UEs, tentative in standing, requires increased time to stand fully upright                           ADL either performed or assessed with clinical judgement   ADL Overall ADL's : Needs assistance/impaired Eating/Feeding: Set up;Sitting   Grooming: Set up;Sitting   Upper Body Bathing: Minimal assistance;Sitting   Lower Body Bathing: Maximal assistance;Sit to/from stand   Upper Body Dressing : Moderate assistance;Sitting   Lower Body Dressing: Total assistance;Bed level   Toilet Transfer: Minimal assistance;Stand-pivot                   Vision Ability to See in Adequate Light: 0 Adequate Patient Visual Report: No change from baseline       Perception         Praxis         Pertinent Vitals/Pain Pain Assessment Pain Assessment: Faces     Extremity/Trunk Assessment Upper Extremity Assessment Upper Extremity Assessment: Generalized weakness   Lower  Extremity Assessment Lower Extremity Assessment: Defer to PT evaluation   Cervical / Trunk Assessment Cervical / Trunk Assessment: Normal   Communication Communication Communication: Impaired Factors Affecting Communication: Reduced clarity of speech   Cognition Arousal: Alert Behavior During Therapy: WFL for tasks assessed/performed Cognition: No apparent  impairments                               Following commands: Intact       Cueing  General Comments   Cueing Techniques: Verbal cues      Exercises     Shoulder Instructions      Home Living Family/patient expects to be discharged to:: Private residence Living Arrangements: Spouse/significant other;Children Available Help at Discharge: Family;Available 24 hours/day Type of Home: House Home Access: Stairs to enter [employer] of Steps: 2 Entrance Stairs-Rails: Right;Left Home Layout: Two level;Able to live on main level with bedroom/bathroom Alternate Level Stairs-Number of Steps: 10 goes to basement Alternate Level Stairs-Rails: Left Bathroom Shower/Tub: Tub/shower unit;Walk-in shower   Bathroom Toilet: Standard     Home Equipment: Agricultural consultant (2 wheels);Toilet riser;Grab bars - toilet;Grab bars - tub/shower;Shower seat          Prior Functioning/Environment Prior Level of Function : Independent/Modified Independent;Driving;History of Falls (last six months)             Mobility Comments: Pt reports one fall. Ind without AD at baseline. ADLs Comments: Pt reports ind with ADL's and IADL's, has not driven much in the last month.    OT Problem List: Decreased activity tolerance;Impaired balance (sitting and/or standing);Decreased strength;Decreased knowledge of use of DME or AE;Cardiopulmonary status limiting activity   OT Treatment/Interventions: Self-care/ADL training;Energy conservation;DME and/or AE instruction;Therapeutic activities;Patient/family education;Balance training      OT Goals(Current goals can be found in the care plan section)   Acute Rehab OT Goals OT Goal Formulation: With patient Time For Goal Achievement: 01/17/24 Potential to Achieve Goals: Good ADL Goals Pt Will Perform Grooming: with supervision;standing Pt Will Perform Lower Body Bathing: with supervision;sit to/from stand Pt Will Perform Lower Body  Dressing: with supervision;sit to/from stand Pt Will Transfer to Toilet: with supervision;ambulating Pt Will Perform Toileting - Clothing Manipulation and hygiene: with supervision;sit to/from stand Additional ADL Goal #1: Pt will generalize energy conservation strategies in ADLs and mobility.   OT Frequency:  Min 2X/week    Co-evaluation              AM-PAC OT 6 Clicks Daily Activity     Outcome Measure Help from another person eating meals?: None Help from another person taking care of personal grooming?: A Little Help from another person toileting, which includes using toliet, bedpan, or urinal?: A Lot Help from another person bathing (including washing, rinsing, drying)?: A Lot Help from another person to put on and taking off regular upper body clothing?: A Little Help from another person to put on and taking off regular lower body clothing?: A Lot 6 Click Score: 16   End of Session Equipment Utilized During Treatment: Oxygen Nurse Communication: Other (comment) (requests mobility limited to bed to chair)  Activity Tolerance: Treatment limited secondary to medical complications (Comment) (HR) Patient left: in chair;with call bell/phone within reach;with family/visitor present  OT Visit Diagnosis: Unsteadiness on feet (R26.81);Muscle weakness (generalized) (M62.81);Other (comment) (decreased activity tolerance)                Time: 1610-9604 OT Time Calculation (min): 32  min Charges:  OT General Charges $OT Visit: 1 Visit OT Evaluation $OT Eval Moderate Complexity: 1 Mod  Avanell Leigh, OTR/L Acute Rehabilitation Services Office: [phone]   Jonette Nestle 01/03/2024, 11:59 AM

## 2024-01-03 NOTE — Progress Notes (Signed)
 Physical Therapy Treatment Patient Details Name: Adrian Lambert MRN: [id number] DOB: [birth year] Today's Date: 01/03/2024   History of Present Illness Pt is 83 yo presenting to [facility] ED on 6/10 due to shortness of breath Developed cardiogenic shock, progressive hypotension and signs of hyperperfusion. Admitted to ICU for ionotropes. PMH: arthritis, systolic and diastolic CHF, diverticulosis, HTN, incomplete RBBB, PAF   PT Comments  Pt in bed upon arrival and agreeable to PT session. Pt continues to need CGA for safety for bed mobility with+2 for safety with lines. Pt was slightly unsteady when standing with increased time needed to reach upright posture. Step-pivot performed with MinA for unsteadiness and +2 for line management. Discussed using RW in future sessions for improved stability and activity tolerance with pt agreement. Deferred gait training per RN request for heart rhythm. Continue to recommend HHPT upon d/c home. Acute PT to follow.      If plan is discharge home, recommend the following: A little help with walking and/or transfers;Help with stairs or ramp for entrance;Assist for transportation;Assistance with cooking/housework   Can travel by private vehicle      Yes  Equipment Recommendations  None recommended by PT       Precautions / Restrictions Precautions Precautions: Fall Recall of Precautions/Restrictions: Intact Precaution/Restrictions Comments: watch O2 sats, HR Restrictions Weight Bearing Restrictions Per Provider Order: No     Mobility  Bed Mobility Overal bed mobility: Needs Assistance Bed Mobility: Supine to Sit    Supine to sit: Contact guard, +2 for safety/equipment    General bed mobility comments: increased time, HOB up    Transfers Overall transfer level: Needs assistance Equipment used: None Transfers: Sit to/from Stand, Bed to chair/wheelchair/BSC Sit to Stand: Min assist   Step pivot transfers: Min assist     General transfer comment:  for stability and multiple lines, increased time    Ambulation/Gait    General Gait Details: Deferred per RN request due to heart rhythm     Balance Overall balance assessment: Needs assistance   Sitting balance-Leahy Scale: Fair     Standing balance support: No upper extremity supported, Single extremity supported, During functional activity Standing balance-Leahy Scale: Poor Standing balance comment: pt seeking stability with UEs, tentative in standing, requires increased time to stand fully upright     Communication Communication Communication: Impaired Factors Affecting Communication: Reduced clarity of speech  Cognition Arousal: Alert Behavior During Therapy: WFL for tasks assessed/performed   PT - Cognitive impairments: No apparent impairments      Following commands: Intact      Cueing Cueing Techniques: Verbal cues     General Comments General comments (skin integrity, edema, etc.): Discussed ankle pumps, SLR, and hip ABD/ADD      Pertinent Vitals/Pain Pain Assessment Pain Assessment: No/denies pain    Home Living Family/patient expects to be discharged to:: Private residence Living Arrangements: Spouse/significant other;Children Available Help at Discharge: Family;Available 24 hours/day Type of Home: House Home Access: Stairs to enter Entrance Stairs-Rails: Right;Left Entrance Stairs-Number of Steps: 2 Alternate Level Stairs-Number of Steps: 10 goes to basement Home Layout: Two level;Able to live on main level with bedroom/bathroom Home Equipment: Rolling Walker (2 wheels);Toilet riser;Grab bars - toilet;Grab bars - tub/shower;Shower seat          PT Goals (current goals can now be found in the care plan section) Acute Rehab PT Goals PT Goal Formulation: With patient Time For Goal Achievement: 01/16/24 Potential to Achieve Goals: Good Progress towards PT goals: Progressing toward goals  Frequency    Min 2X/week       AM-PAC PT 6  Clicks Mobility   Outcome Measure  Help needed turning from your back to your side while in a flat bed without using bedrails?: A Little Help needed moving from lying on your back to sitting on the side of a flat bed without using bedrails?: A Little Help needed moving to and from a bed to a chair (including a wheelchair)?: A Little Help needed standing up from a chair using your arms (e.g., wheelchair or bedside chair)?: A Little Help needed to walk in hospital room?: A Little Help needed climbing 3-5 steps with a railing? : A Lot 6 Click Score: 17    End of Session Equipment Utilized During Treatment: Oxygen Activity Tolerance: Patient tolerated treatment well Patient left: in chair;with call bell/phone within reach;with family/visitor present Nurse Communication: Mobility status PT Visit Diagnosis: Unsteadiness on feet (R26.81);Muscle weakness (generalized) (M62.81)     Time: 1610-9604 PT Time Calculation (min) (ACUTE ONLY): 18 min  Charges:    $Therapeutic Activity: 8-22 mins PT General Charges $$ ACUTE PT VISIT: 1 Visit                     Orysia Blas, PT, DPT Secure Chat Preferred  Rehab Office [phone]   Alissa April Adela Ades 01/03/2024, 12:30 PM

## 2024-01-03 NOTE — Progress Notes (Signed)
 ANTICOAGULATION CONSULT NOTE  Pharmacy Consult for Warfarin Indication: afib with mechanical AVR  Allergies  Allergen Reactions   Celecoxib Rash    Other Reaction(s): Unknown    Patient Measurements: Height: 5' 10 (177.8 cm) Weight: 82.3 kg (181 lb 7 oz) IBW/kg (Calculated) : 73  Vital Signs: Temp: 98.5 F (36.9 C) (06/12 1213) Temp Source: Oral (06/12 1213) BP: 108/85 (06/12 1330) Pulse Rate: 97 (06/12 1345)  Labs: Recent Labs    01/01/24 1124 01/01/24 1336 01/01/24 1650 01/02/24 0426 01/02/24 1441 01/03/24 0141 01/03/24 0449  HGB 15.0  --   --  14.9 14.8 17.0 14.9  HCT 48.1  --   --  46.8 46.6 50.0 45.9  PLT 162  --   --  168 172  --  180  LABPROT  --   --  30.7* 31.9*  --   --  31.1*  INR  --   --  2.9* 3.1*  --   --  3.0*  CREATININE 2.14* 2.00*  --  1.96*  --   --  2.01*  TROPONINIHS 38* 41*  --   --   --   --   --     Estimated Creatinine Clearance: 29.3 mL/min (A) (by C-G formula based on SCr of 2.01 mg/dL (H)).   Medical History: Past Medical History:  Diagnosis Date   Anticoagulant long-term use    Arthritis    probably in my thumbs (06/26/2012)   Bifascicular block    Chronic combined systolic and diastolic CHF (congestive heart failure) (HCC)    a. 01/2016 Echo: EF 45-50%, gr2 DD, inflat AK (poor acoustic windows);  b. 07/2016 Echo: EF 25-30%, mild LVH, PASP (pt in Afib).   Complication of anesthesia    wake up w/a start; hallucinations (06/26/2012)   Critical Aortic Stenosis    a. 03/2003 s/p SJM #25 mech prosthetic AoV;  b. 07/2016 Echo: EF 25-30% (in setting of AF), AoV area 1.72 cm^2 (VTI), 1.75 cm^2 (Vmean).   Diverticulosis    Gouty arthritis    have had it in both feet, ankles, right knee (06/26/2012)   History of diverticulitis of colon    History of kidney stones    History of pancreatitis    Hypertension    Incomplete RBBB    PAF (paroxysmal atrial fibrillation) (HCC)    a. CHA2DS2VASc = 4-->chronic coumadin  in setting of  mech AoV;  b. 07/2016 Recurrent AF RVR.    Medications:  Medications Prior to Admission  Medication Sig Dispense Refill Last Dose/Taking   acetaminophen  (TYLENOL ) 500 MG tablet Take 500 mg by mouth 2 (two) times daily.   01/01/2024   albuterol  (VENTOLIN  HFA) 108 (90 Base) MCG/ACT inhaler Inhale 2 puffs into the lungs every 4 (four) hours.   01/01/2024   allopurinol  (ZYLOPRIM ) 300 MG tablet Take 150 mg by mouth daily.   01/01/2024   aspirin  EC 81 MG tablet Take 81 mg by mouth daily. Swallow whole.   01/01/2024   clobetasol cream (TEMOVATE) 0.05 % Apply 1 Application topically 2 (two) times daily.   01/01/2024   colchicine  0.6 MG tablet Take 1 tablet (0.6 mg total) by mouth daily as needed (for gout).   Unknown   FARXIGA  10 MG TABS tablet TAKE 1 TABLET BY MOUTH EVERY DAY 30 tablet 6 01/01/2024   FIBER PO Take 2 capsules by mouth 2 (two) times daily.   01/01/2024   folic acid  (FOLVITE ) 1 MG tablet TAKE 1 TABLET(1 MG) BY MOUTH  DAILY 90 tablet 3 01/01/2024   metoprolol  succinate (TOPROL -XL) 25 MG 24 hr tablet TAKE 1 TABLET BY MOUTH EVERYDAY AT BEDTIME 90 tablet 1 12/31/2023   potassium chloride  SA (KLOR-CON  M) 20 MEQ tablet Take 1 tablet (20 mEq total) by mouth daily.   01/01/2024   tamsulosin  (FLOMAX ) 0.4 MG CAPS capsule Take 1 capsule (0.4 mg total) by mouth every other day. 90 capsule 3 01/01/2024   tolterodine  (DETROL  LA) 4 MG 24 hr capsule Take 1 capsule (4 mg total) by mouth every other day. Alternating days with the tamsulosin  90 capsule 3 12/31/2023   torsemide  (DEMADEX ) 20 MG tablet Take 3 tablets (60 mg total) by mouth daily.   01/01/2024   warfarin (COUMADIN ) 5 MG tablet Take 0.5-1 tablets (2.5-5 mg total) by mouth See admin instructions. 1/19 and 1/20  take 7.5mg  (1 and 1/2 tab) then take 1 tab (5mg ) daily until seen by MD  Take 1 tablet (5 mg) by mouth on Sundays, Mondays, Wednesdays, Thursdays & Saturdays in the evening. Take 0.5 tablet (2.5 mg) by mouth on Tuesdays & Fridays in the evening. (Patient  taking differently: Take 2.5-5 mg by mouth See admin instructions. AS directed by PCP 2.5 mg Daily) 45 tablet 11 12/31/2023 at  8:00 PM   docusate sodium  (COLACE) 100 MG capsule Take 100 mg by mouth 2 (two) times daily. (Patient not taking: Reported on 01/01/2024)   Not Taking   polyethylene glycol powder (GLYCOLAX /MIRALAX ) 17 GM/SCOOP powder Take 17 g by mouth 2 (two) times daily as needed for moderate constipation or mild constipation. (Patient not taking: Reported on 12/26/2023) 238 g 2    Scheduled:   allopurinol   150 mg Oral Daily   aspirin  EC  81 mg Oral Daily   Chlorhexidine Gluconate Cloth  6 each Topical Daily   Chlorhexidine Gluconate Cloth  6 each Topical Daily   dapagliflozin  propanediol  10 mg Oral Daily   [START ON 01/04/2024] fesoterodine   4 mg Oral QODAY   mupirocin ointment  1 Application Nasal BID   sodium zirconium cyclosilicate  5 g Oral Daily   tamsulosin   0.4 mg Oral QODAY   warfarin  2.5 mg Oral ONCE-1600   Warfarin - Pharmacist Dosing Inpatient   Does not apply q1600   Infusions:   sodium chloride  Stopped (01/03/24 9528)   amiodarone  60 mg/hr (01/03/24 1200)   furosemide  (LASIX ) 200 mg in dextrose  5 % 100 mL (2 mg/mL) infusion 20 mg/hr (01/03/24 1200)   milrinone  0.2 mcg/kg/min (01/03/24 1200)   norepinephrine (LEVOPHED) Adult infusion 5 mcg/min (01/03/24 1200)   PRN:   Assessment: 83 yo male presenting with worsening shortness of breath. On warfarin PTA for afib with mechanical AVR.    Per patient, dose is 5 mg daily, poor historian. Upon chart review patient should be on 2.5 mg once daily on Monday, Tuesday, Thursday, Friday, and Sunday and 1 mg on Wednesday and Saturday per last PCP note on 12/18/2023. Goal INR is 2.5-3.5.   INR today is 3 which is therapeutic. CBC remains stable.  Goal of Therapy:  INR 2.5-3.5 Monitor platelets by anticoagulation protocol: Yes   Plan:  Will give 2.5 mg warfarin today based on PTA dose Monitor for s/s of hemorrhage, daily  INR, CBC Watch for new DDIs  Thank you for involving pharmacy in the patient's care.   Joanell Mowers, Davey Erp, BCCP Clinical Pharmacist  01/03/2024 2:02 PM   [facility] pharmacy phone numbers are listed on [email or web address]

## 2024-01-03 NOTE — Progress Notes (Signed)
  Echocardiogram 2D Echocardiogram has been performed.  Fain Home RDCS 01/03/2024, 3:40 PM

## 2024-01-03 NOTE — Progress Notes (Signed)
 Advanced Heart Failure Rounding Note   Subjective:    Moved to ICU yesterday for cardiogenic shock.  LA 3.2 Co-ox 41% -> 32% -> 55%  LA  2.1 -> 1.5  Now on NE 5 milrinone  0.125   Feeling  better starting to diurese.   Remains in AF with RVR   Objective:   Weight Range:  Vital Signs:   Temp:  [96.8 F (36 C)-98.7 F (37.1 C)] 98 F (36.7 C) (06/12 0900) Pulse Rate:  [62-125] 122 (06/12 1000) Resp:  [11-44] 19 (06/12 1000) BP: (78-125)/(56-96) 107/57 (06/12 1000) SpO2:  [69 %-100 %] 95 % (06/12 1000) Weight:  [82.3 kg-91.5 kg] 82.3 kg (06/12 0500) Last BM Date : 01/01/24  Weight change: Filed Weights   01/02/24 1405 01/02/24 1828 01/03/24 0500  Weight: 82.5 kg 91.5 kg 82.3 kg    Intake/Output:   Intake/Output Summary (Last 24 hours) at 01/03/2024 1143 Last data filed at 01/03/2024 1100 Gross per 24 hour  Intake 349 ml  Output 1875 ml  Net -1526 ml     Physical Exam: General:  Elderly No resp difficulty HEENT: normal Neck: supple. JVP to ear. LIJ TLC Cor: Irreg tachy mech s2 Lungs: + crackles Abdomen: soft, nontender, nondistended. No hepatosplenomegaly. No bruits or masses. Good bowel sounds. Extremities: no cyanosis, clubbing, rash, 2+ edema Neuro: alert & orientedx3, cranial nerves grossly intact. moves all 4 extremities w/o difficulty. Affect pleasant  Telemetry: AF 120s Personally reviewed  Labs: Basic Metabolic Panel: Recent Labs  Lab 01/01/24 1124 01/01/24 1336 01/02/24 0426 01/03/24 0141 01/03/24 0449  NA 132*  --  129* 123* 126*  K 5.2*  --  5.5* 4.4 4.4  CL 92*  --  93*  --  86*  CO2 24  --  21*  --  25  GLUCOSE 190*  --  116*  --  139*  BUN 60*  --  70*  --  81*  CREATININE 2.14* 2.00* 1.96*  --  2.01*  CALCIUM 9.0  --  9.1  --  8.8*  MG 2.8*  --   --   --  2.6*    Liver Function Tests: Recent Labs  Lab 01/01/24 1124 01/03/24 0449  AST 90* 110*  ALT 73* 124*  ALKPHOS 145* 158*  BILITOT 3.4* 3.2*  PROT 6.7 6.4*   ALBUMIN 3.7 3.4*   No results for input(s): LIPASE, AMYLASE in the last 168 hours. No results for input(s): AMMONIA in the last 168 hours.  CBC: Recent Labs  Lab 01/01/24 1124 01/02/24 0426 01/02/24 1441 01/03/24 0141 01/03/24 0449  WBC 15.4* 16.0* 16.4*  --  18.2*  NEUTROABS 13.3*  --   --   --   --   HGB 15.0 14.9 14.8 17.0 14.9  HCT 48.1 46.8 46.6 50.0 45.9  MCV 64.7* 63.5* 62.8*  --  62.2*  PLT 162 168 172  --  180    Cardiac Enzymes: No results for input(s): CKTOTAL, CKMB, CKMBINDEX, TROPONINI in the last 168 hours.  BNP: BNP (last 3 results) Recent Labs    12/10/23 1619 12/25/23 0935 01/01/24 1124  BNP 3,213.0* 2,855.5* 3,066.5*    ProBNP (last 3 results) No results for input(s): PROBNP in the last 8760 hours.    Other results:  Imaging: DG CHEST PORT 1 VIEW Result Date: 01/02/2024 CLINICAL DATA:  [id number].  Encounter for central line placement. EXAM: PORTABLE CHEST 1 VIEW COMPARISON:  Earlier study today at 6:35 p.m. FINDINGS: 9:02 p.m. Left  IJ central line has been repositioned and now terminates in the mid to lower right atrium. No visible pneumothorax. Sternotomy sutures and AVR are again noted. Cardiomegaly. Aortic atherosclerosis. The mediastinal configuration is stable. There is interval worsened perihilar vascular congestion, mid to lower zonal interstitial edema, and increasing small pleural effusions. There are increased perihilar opacities which are probably due to alveolar edema. Underlying pneumonia possible in the proper clinical setting. Findings are superimposed on changes of pulmonary fibrosis. There is thoracic spondylosis with no new osseous findings. IMPRESSION: 1. Left IJ central line has been repositioned and now terminates in the mid to lower right atrium. No visible pneumothorax. 2. Interval worsening of perihilar vascular congestion, mid to lower zonal interstitial edema, and increasing small pleural effusions. 3. Increased  perihilar opacities which are probably due to alveolar edema. Underlying pneumonia possible in the proper clinical setting. 4. Background of pulmonary fibrosis. 5. Aortic atherosclerosis. Electronically Signed   By: Denman Fischer M.D.   On: 01/02/2024 21:12   DG CHEST PORT 1 VIEW Result Date: 01/02/2024 CLINICAL DATA:  Central line placement EXAM: PORTABLE CHEST 1 VIEW COMPARISON:  01/01/2024 FINDINGS: Left central line is been placed. This courses into the right innominate vein and likely into the right internal jugular vein. No pneumothorax. Prior median sternotomy and valve replacement. Cardiomegaly. Stable interstitial prominence throughout the lungs, likely chronic interstitial lung disease. Small right pleural effusion. IMPRESSION: Left central line passes into the right innominate vein with the tip likely in the right internal jugular vein. No pneumothorax. Cardiomegaly. Stable chronic interstitial lung disease. Electronically Signed   By: Janeece Mechanic M.D.   On: 01/02/2024 18:45   CT CHEST WO CONTRAST Result Date: 01/01/2024 CLINICAL DATA:  Shortness of breath EXAM: CT CHEST WITHOUT CONTRAST TECHNIQUE: Multidetector CT imaging of the chest was performed following the standard protocol without IV contrast. RADIATION DOSE REDUCTION: This exam was performed according to the departmental dose-optimization program which includes automated exposure control, adjustment of the mA and/or kV according to patient size and/or use of iterative reconstruction technique. COMPARISON:  Chest x-ray 01/01/2024 CT 09/24/2023 10/16/2022, MRA chest 11/16/2022, 10/15/2021 FINDINGS: Cardiovascular: Limited evaluation without intravenous contrast. Moderate aortic atherosclerosis. Aneurysmal dilatation of the mid ascending aorta measuring up to 4.6 cm, previously 4.6 cm. Aortic valve repair. Cardiomegaly. Coronary vascular calcification. No pericardial effusion Mediastinum/Nodes: Patent trachea. No thyroid  mass. Subcentimeter  mediastinal lymph nodes. Esophagus within normal limits Lungs/Pleura: Chronic interstitial lung disease with subpleural reticulation, bilateral bronchiectasis and mild ground-glass disease. New small focus of airspace disease at the left apex, series 4, image 18. New or increased small moderate right and small left pleural effusions. Upper Abdomen: No acute finding Musculoskeletal: Gynecomastia. Sternotomy. Slight heterogeneous density within the left chest wall inferior to the nipple which contained fat density and stranding, likely corresponding to the possible mass on prior MRA. IMPRESSION: 1. Chronic interstitial lung disease with subpleural reticulation, bilateral bronchiectasis and mild ground-glass disease. New small focus of airspace disease at the left apex, possible pneumonia. 2. New or increased small to moderate right and small left pleural effusions. 3. Cardiomegaly. 4. Aneurysmal dilatation of the mid ascending aorta measuring up to 4.6 cm, previously 4.6 cm. Ascending thoracic aortic aneurysm. Recommend semi-annual imaging followup by CTA or MRA and referral to cardiothoracic surgery if not already obtained. This recommendation follows 2010 ACCF/AHA/AATS/ACR/ASA/SCA/SCAI/SIR/STS/SVM Guidelines for the Diagnosis and Management of Patients With Thoracic Aortic Disease. Circulation. 2010; 121: O962-X528. Aortic aneurysm NOS (ICD10-I71.9) 5. Slight heterogeneous density within the left  chest wall inferior to the nipple which contains fat density and stranding, likely corresponding to the possible mass on prior MRA, reference report 11/16/2022 for additional recommendations 6. Aortic atherosclerosis. Aortic Atherosclerosis (ICD10-I70.0). Electronically Signed   By: Esmeralda Hedge M.D.   On: 01/01/2024 16:23     Medications:     Scheduled Medications:  allopurinol   150 mg Oral Daily   aspirin  EC  81 mg Oral Daily   Chlorhexidine Gluconate Cloth  6 each Topical Daily   Chlorhexidine Gluconate Cloth   6 each Topical Daily   dapagliflozin  propanediol  10 mg Oral Daily   [START ON 01/04/2024] fesoterodine   4 mg Oral QODAY   mupirocin ointment  1 Application Nasal BID   sodium zirconium cyclosilicate  5 g Oral Daily   tamsulosin   0.4 mg Oral QODAY   Warfarin - Pharmacist Dosing Inpatient   Does not apply q1600    Infusions:  sodium chloride  Stopped (01/03/24 0937)   amiodarone  60 mg/hr (01/03/24 1000)   furosemide  (LASIX ) 200 mg in dextrose  5 % 100 mL (2 mg/mL) infusion 20 mg/hr (01/03/24 1000)   milrinone  0.2 mcg/kg/min (01/03/24 1003)   norepinephrine (LEVOPHED) Adult infusion 5 mcg/min (01/03/24 1000)    PRN Medications:    Assessment/Plan:    1. Acute on chronic systolic CHF -> cardiogenic shock:  - Coronary angiography in 1/23  mild CAD.  - Echo 6/24 showed EF 30-35%, RV mildly reduced.  - Has developed recurrent HF/shock in setting of recurrent AF - LA 2.1. Co-ox 41%  - Now on NE 5 milrinone  0.125. Co-ox marginal at 55% - Remains volume overloaded. Start lasix  gtt - Now off entresto , toprol  XL, and spiro  - Continue Farxiga  10 mg daily.  - Update echo  - will need DC-CV when stabilized   2. Mechanical aortic valve: Continue warfarin and ASA 81. - Discussed warfarin dosing with PharmD personally.   3. Paroxysmal Atrial fibrillation/atrial flutter:  - Admitted 1/18 with atrial fibrillation with RVR and CHF.  EF down to 20-25%.  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   - now with cardiogenic shock in setting of recurrent AF.  - Need to get him back in NSR - Start amio (despite lung disease) - Plan DC-CV when more stable    4. Ascending aortic aneurysm: 4.3 cm on 4/24 MRA chest.   5. AKI on CKD2: Creatinine 2.14 on admit. Baseline 1.5. Suspect cardio-renal.  - support CO and BP w/ NE per above - diuresis w/ IV Lasix  - follow BMP    6. Hyperkalemia] - rec'd lokelma. Improved with hemodynamic support   7. Shock liver - likely 2/2  low output/hepatic congestion - inotropes + diuretics per above - follow trends    8. Hyponatremia - limit FW  9. ILD - CT reviewed with P/CCM  CRITICAL CARE Performed by: Jules Oar  Total critical care time: 45 minutes  Critical care time was exclusive of separately billable procedures and treating other patients.  Critical care was necessary to treat or prevent imminent or life-threatening deterioration.  Critical care was time spent personally by me (independent of midlevel providers or residents) on the following activities: development of treatment plan with patient and/or surrogate as well as nursing, discussions with consultants, evaluation of patient's response to treatment, examination of patient, obtaining history from patient or surrogate, ordering and performing treatments and interventions, ordering and review of laboratory studies, ordering and review of radiographic studies, pulse oximetry and re-evaluation of  patient's condition.   Length of Stay: 2  Jules Oar MD 01/03/2024, 11:43 AM  Advanced Heart Failure Team Pager [phone] (M-F; 7a - 4p)  Please contact [facility] for night-coverage after hours (4p -7a ) and weekends on [email or web address]

## 2024-01-03 NOTE — Plan of Care (Signed)

## 2024-01-03 NOTE — Consult Note (Signed)
 NAME:  Adrian Lambert, MRN:  [id number], DOB:  [birth year], LOS: 2 ADMISSION DATE:  01/01/2024, CONSULTATION DATE:  01/03/2024 REFERRING MD:  Dr. Darlyn Eke - [facility], CHIEF COMPLAINT: Cardiogenic shock  History of Present Illness:  Adrian Lambert is a 83 year old male with a past medical history significant for HFrEF, mechanical AVR MP AF on warfarin, ILD, BPH and HTN who presented to the ED at [facility] 6/10 for complaints of shortness of breath.  Workup was concerning for HFrEF exacerbation patient was admitted per hospitalist with heart failure consult.  Mid afternoon 6/11 patient was transferred to ICU for initiation of inotropic and vasopressor support.  PCCM consulted for ongoing medical management.  Pertinent  Medical History  HFrEF, mechanical AVR MP AF on warfarin, ILD, BPH and HTN   Significant Hospital Events: Including procedures, antibiotic start and stop dates in addition to other pertinent events   6/10 presented with complaints of shortness of breath concern for low flow state in the setting of known HFrEF  Interim History / Subjective:  Lying in bed resting  Objective    Blood pressure 113/81, pulse (!) 109, temperature 98 F (36.7 C), temperature source Oral, resp. rate 15, height 5' 10 (1.778 m), weight 82.3 kg, SpO2 94%. CVP:  [11 mmHg-31 mmHg] 11 mmHg      Intake/Output Summary (Last 24 hours) at 01/03/2024 0829 Last data filed at 01/03/2024 0700 Gross per 24 hour  Intake 269.01 ml  Output 1350 ml  Net -1080.99 ml   Filed Weights   01/02/24 1405 01/02/24 1828 01/03/24 0500  Weight: 82.5 kg 91.5 kg 82.3 kg    Examination: General: Acute on chronic ill-appearing deconditioned elderly male lying in bed in no acute distress HEENT: [place]/AT, MM pink/moist, PERRL,  Neuro: Alert and oriented x 3, nonfocal CV: Mildly tachycardic with multiple arrhythmias, no murmur, rubs, or gallops,  PULM: Clear to auscultation bilaterally, no increased work of breathing, no added  breath sounds GI: soft, bowel sounds active in all 4 quadrants, non-tender, non-distended, tolerating oral diet Extremities: warm/dry, no edema  Skin: no rashes or lesions   Resolved problem list   Assessment and Plan  Acute on chronic systolic CHF with concern for low flow state -Initial Choloxin post central access 41.3 P: Cardiology/heart failure following, appreciate assistance Continue milrinone  and Levophed for HF HF to start Lasix  drip today Continue aspirin  Follow repeat echo scheduled today Heart health diet with sodium restriction when able  Strict intake and output  Daily weight to assess volume status Daily assessment for need to diurese  Closely monitor renal function and electrolytes  Optimal electrolytes  GDMT as able Daily coox   Mechanical aortic valve anticoagulated at baseline with warfarin P: Continue warfarin  Atrial fibrillation/atrial flutter anticoagulated baseline P: Continue telemetry Continue warfarin Optimize electrolytes  History of interstitial lung disease - 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 P: Supportive care, follow in the outpatient setting for potential initiation of immunosuppressive therapy  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 -Baseline creatinine 1.3-1.7, creatinine 2.14 with GFR 38 on admission Hyperkalemia P: Follow renal function  Mobilier urine output Trend Bmet Avoid nephrotoxins Ensure adequate renal perfusion   Elevated LFTs - Likely secondary to low flow state during cardiogenic shock P: Avoid hepatotoxins Continue inotropic and vasopressor support as above  Hyponatremia presumed hypervolemic P: Trend CMP Lasix  drip as above  Best Practice (right click and Reselect all SmartList Selections daily)   Diet/type: Regular consistency (see orders) DVT prophylaxis Coumadin   Pressure ulcer(s): N/A GI prophylaxis: PPI Lines: Central line Foley:  Yes, and it is still needed Code  Status:  full code Last date of multidisciplinary goals of care discussion: Continue to update patient and family daily  Labs   CBC: Recent Labs  Lab 01/01/24 1124 01/02/24 0426 01/02/24 1441 01/03/24 0141 01/03/24 0449  WBC 15.4* 16.0* 16.4*  --  18.2*  NEUTROABS 13.3*  --   --   --   --   HGB 15.0 14.9 14.8 17.0 14.9  HCT 48.1 46.8 46.6 50.0 45.9  MCV 64.7* 63.5* 62.8*  --  62.2*  PLT 162 168 172  --  180    Basic Metabolic Panel: Recent Labs  Lab 01/01/24 1124 01/01/24 1336 01/02/24 0426 01/03/24 0141 01/03/24 0449  NA 132*  --  129* 123* 126*  K 5.2*  --  5.5* 4.4 4.4  CL 92*  --  93*  --  86*  CO2 24  --  21*  --  25  GLUCOSE 190*  --  116*  --  139*  BUN 60*  --  70*  --  81*  CREATININE 2.14* 2.00* 1.96*  --  2.01*  CALCIUM 9.0  --  9.1  --  8.8*  MG 2.8*  --   --   --  2.6*   GFR: Estimated Creatinine Clearance: 29.3 mL/min (A) (by C-G formula based on SCr of 2.01 mg/dL (H)). Recent Labs  Lab 01/01/24 1124 01/02/24 0426 01/02/24 1441 01/02/24 2120 01/03/24 0142 01/03/24 0449 01/03/24 0454  PROCALCITON  --   --  0.33  --   --   --   --   WBC 15.4* 16.0* 16.4*  --   --  18.2*  --   LATICACIDVEN  --   --   --  2.0* 2.1*  --  1.5    Liver Function Tests: Recent Labs  Lab 01/01/24 1124 01/03/24 0449  AST 90* 110*  ALT 73* 124*  ALKPHOS 145* 158*  BILITOT 3.4* 3.2*  PROT 6.7 6.4*  ALBUMIN 3.7 3.4*   No results for input(s): LIPASE, AMYLASE in the last 168 hours. No results for input(s): AMMONIA in the last 168 hours.  ABG    Component Value Date/Time   HCO3 26.2 01/03/2024 0141   TCO2 27 01/03/2024 0141   O2SAT 55.1 01/03/2024 0528     Coagulation Profile: Recent Labs  Lab 01/01/24 1650 01/02/24 0426 01/03/24 0449  INR 2.9* 3.1* 3.0*    Cardiac Enzymes: No results for input(s): CKTOTAL, CKMB, CKMBINDEX, TROPONINI in the last 168 hours.  HbA1C: No results found for: HGBA1C  CBG: No results for input(s):  GLUCAP in the last 168 hours.  Review of Systems:   Please see the history of present illness. All other systems reviewed and are negative    Past Medical History:  He,  has a past medical history of Anticoagulant long-term use, Arthritis, Bifascicular block, Chronic combined systolic and diastolic CHF (congestive heart failure) (HCC), Complication of anesthesia, Critical Aortic Stenosis, Diverticulosis, Gouty arthritis, History of diverticulitis of colon, History of kidney stones, History of pancreatitis, Hypertension, Incomplete RBBB, and PAF (paroxysmal atrial fibrillation) (HCC).   Surgical History:   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  DR GERHARDT   #42mm [facility]UDE MECHNICAL PROSTHESIS   ATRIAL FIBRILLATION ABLATION N/A 11/28/2016   Procedure: Atrial Fibrillation Ablation;  Surgeon: Lei Pump, MD;  Location[facility] INVASIVE CV LAB;  Service: Cardiovascular;  Laterality: N/A;   CARDIOVERSION N/A 08/18/2016   Procedure: CARDIOVERSION;  Surgeon: Lake Pilgrim, MD;  Location: [facility] ENDOSCOPY;  Service: Cardiovascular;  Laterality: N/A;   CARDIOVERSION N/A 08/28/2016   Procedure: CARDIOVERSION;  Surgeon: Darlis Eisenmenger, MD;  Location: [facility] ENDOSCOPY;  Service: Cardiovascular;  Laterality: N/A;   CARDIOVERSION N/A 04/05/2022   Procedure: CARDIOVERSION;  Surgeon: Darlis Eisenmenger, MD;  Location: [facility] ENDOSCOPY;  Service: Cardiovascular;  Laterality: N/A;   CATARACT EXTRACTION W/ INTRAOCULAR LENS  IMPLANT, BILATERAL  RIGHT 2010/  LEFT DEC 2013   COLONOSCOPY WITH PROPOFOL  N/A 02/22/2022   Procedure: COLONOSCOPY WITH PROPOFOL ;  Surgeon: Ozell Blunt, MD;  Location: [facility] ENDOSCOPY;  Service: Gastroenterology;  Laterality: N/A;   HEMICOLECTOMY  10/06/2003   Laparoscopic-assisted left hemicolectomy for diverticulitis   LAPAROSCOPIC CHOLECYSTECTOMY  07-23-2006   POLYPECTOMY  02/22/2022   Procedure: POLYPECTOMY;  Surgeon: Ozell Blunt, MD;  Location: [facility]  ENDOSCOPY;  Service: Gastroenterology;;   PROSTATE BIOPSY  10/09/2012   gleason 3+4=7   RADIOACTIVE SEED IMPLANT N/A 01/09/2013   Procedure: RADIOACTIVE SEED IMPLANT;  Surgeon: Trent Frizzle, MD;  Location: [facility];  Service: Urology;  Laterality: N/A;   RIGHT/LEFT HEART CATH AND CORONARY ANGIOGRAPHY N/A 08/11/2021   Procedure: RIGHT/LEFT HEART CATH AND CORONARY ANGIOGRAPHY;  Surgeon: Darlis Eisenmenger, MD;  Location: [facility] INVASIVE CV LAB;  Service: Cardiovascular;  Laterality: N/A;   TEE WITHOUT CARDIOVERSION N/A 08/18/2016   Procedure: TRANSESOPHAGEAL ECHOCARDIOGRAM (TEE);  Surgeon: Lake Pilgrim, MD;  Location: [facility] ENDOSCOPY;  Service: Cardiovascular;  Laterality: N/A;   TRANSTHORACIC ECHOCARDIOGRAM  04-28-2008   MILD LVH / NORMAL LSF/ NORMAL AORTIC VALVE MECHANICAL PROSTHESIS FUNCTION/ MILD LAE/ EF 55-60%     Social History:   reports that he has never smoked. He has never used smokeless tobacco. He reports current alcohol use of about 3.0 standard drinks of alcohol per week. He reports that he does not use drugs.   Family History:  His family history includes Diabetes in his mother; Heart failure in his mother; Prostate cancer in his father.   Allergies Allergies  Allergen Reactions   Celecoxib Rash    Other Reaction(s): Unknown     Home Medications  Prior to Admission medications   Medication Sig Start Date End Date Taking? Authorizing Provider  acetaminophen  (TYLENOL ) 500 MG tablet Take 500 mg by mouth 2 (two) times daily.   Yes [provider]  albuterol  (VENTOLIN  HFA) 108 (90 Base) MCG/ACT inhaler Inhale 2 puffs into the lungs every 4 (four) hours.   Yes [provider]  allopurinol  (ZYLOPRIM ) 300 MG tablet Take 150 mg by mouth daily.   Yes [provider]  aspirin  EC 81 MG tablet Take 81 mg by mouth daily. Swallow whole.   Yes [provider]  clobetasol cream (TEMOVATE) 0.05 % Apply 1 Application topically 2 (two) times  daily.   Yes [provider]  colchicine  0.6 MG tablet Take 1 tablet (0.6 mg total) by mouth daily as needed (for gout). 10/19/22  Yes Theadore Finger, MD  FARXIGA  10 MG TABS tablet TAKE 1 TABLET BY MOUTH EVERY DAY 12/18/23  Yes McLean, Dalton S, MD  FIBER PO Take 2 capsules by mouth 2 (two) times daily.   Yes [provider]  folic acid  (FOLVITE ) 1 MG tablet TAKE 1 TABLET(1 MG) BY MOUTH DAILY 01/07/21  Yes Ennever, Sherryll Donald, MD  metoprolol  succinate (TOPROL -XL) 25 MG 24 hr tablet TAKE 1 TABLET BY  MOUTH EVERYDAY AT BEDTIME 07/16/23  Yes Darlis Eisenmenger, MD  potassium chloride  SA (KLOR-CON  M) 20 MEQ tablet Take 1 tablet (20 mEq total) by mouth daily. 12/27/23  Yes Clegg, Amy D, NP  tamsulosin  (FLOMAX ) 0.4 MG CAPS capsule Take 1 capsule (0.4 mg total) by mouth every other day. 07/05/23  Yes Stoneking, Ponce Brisker., MD  tolterodine  (DETROL  LA) 4 MG 24 hr capsule Take 1 capsule (4 mg total) by mouth every other day. Alternating days with the tamsulosin  07/05/23  Yes Stoneking, Ponce Brisker., MD  torsemide  (DEMADEX ) 20 MG tablet Take 3 tablets (60 mg total) by mouth daily. 12/27/23  Yes Clegg, Amy D, NP  warfarin (COUMADIN ) 5 MG tablet Take 0.5-1 tablets (2.5-5 mg total) by mouth See admin instructions. 1/19 and 1/20  take 7.5mg  (1 and 1/2 tab) then take 1 tab (5mg ) daily until seen by MD  Take 1 tablet (5 mg) by mouth on Sundays, Mondays, Wednesdays, Thursdays & Saturdays in the evening. Take 0.5 tablet (2.5 mg) by mouth on Tuesdays & Fridays in the evening. Patient taking differently: Take 2.5-5 mg by mouth See admin instructions. AS directed by PCP 2.5 mg Daily 08/11/21  Yes McLean, Dalton S, MD  docusate sodium  (COLACE) 100 MG capsule Take 100 mg by mouth 2 (two) times daily. Patient not taking: Reported on 01/01/2024    [provider]  polyethylene glycol powder (GLYCOLAX /MIRALAX ) 17 GM/SCOOP powder Take 17 g by mouth 2 (two) times daily as needed for moderate constipation or mild  constipation. Patient not taking: Reported on 12/26/2023 10/19/22   Gonfa, Taye T, MD     Critical care time:    CRITICAL CARE Performed by: Karem Tomaso D. Harris   Total critical care time: 40 minutes  Critical care time was exclusive of separately billable procedures and treating other patients.  Critical care was necessary to treat or prevent imminent or life-threatening deterioration.  Critical care was time spent personally by me on the following activities: development of treatment plan with patient and/or surrogate as well as nursing, discussions with consultants, evaluation of patient's response to treatment, examination of patient, obtaining history from patient or surrogate, ordering and performing treatments and interventions, ordering and review of laboratory studies, ordering and review of radiographic studies, pulse oximetry and re-evaluation of patient's condition.  Samel Bruna D. Harris, NP-C [facility] Personal contact information can be found on Amion  If no contact or response made please call [phone] 01/03/2024, 10:05 AM

## 2024-01-03 NOTE — Plan of Care (Signed)
  Problem: Clinical Measurements: Goal: Respiratory complications will improve Outcome: Progressing Goal: Cardiovascular complication will be avoided Outcome: Progressing   Problem: Activity: Goal: Risk for activity intolerance will decrease Outcome: Progressing   Problem: Coping: Goal: Level of anxiety will decrease Outcome: Progressing   

## 2024-01-04 DIAGNOSIS — I5043 Acute on chronic combined systolic (congestive) and diastolic (congestive) heart failure: Secondary | ICD-10-CM

## 2024-01-04 DIAGNOSIS — I4892 Unspecified atrial flutter: Secondary | ICD-10-CM | POA: Diagnosis not present

## 2024-01-04 DIAGNOSIS — N1831 Chronic kidney disease, stage 3a: Secondary | ICD-10-CM

## 2024-01-04 DIAGNOSIS — E871 Hypo-osmolality and hyponatremia: Secondary | ICD-10-CM

## 2024-01-04 DIAGNOSIS — R570 Cardiogenic shock: Secondary | ICD-10-CM | POA: Diagnosis not present

## 2024-01-04 DIAGNOSIS — L8990 Pressure ulcer of unspecified site, unspecified stage: Secondary | ICD-10-CM | POA: Insufficient documentation

## 2024-01-04 DIAGNOSIS — N179 Acute kidney failure, unspecified: Secondary | ICD-10-CM | POA: Diagnosis not present

## 2024-01-04 DIAGNOSIS — I480 Paroxysmal atrial fibrillation: Secondary | ICD-10-CM | POA: Diagnosis not present

## 2024-01-04 LAB — CBC
HCT: 42 % (ref 39.0–52.0)
HCT: 44.9 % (ref 39.0–52.0)
Hemoglobin: 13.6 g/dL (ref 13.0–17.0)
Hemoglobin: 14.5 g/dL (ref 13.0–17.0)
MCH: 20.1 pg — ABNORMAL LOW (ref 26.0–34.0)
MCH: 20.3 pg — ABNORMAL LOW (ref 26.0–34.0)
MCHC: 32.4 g/dL (ref 30.0–36.0)
MCHC: 32.3 g/dL (ref 30.0–36.0)
MCV: 61.9 fL — ABNORMAL LOW (ref 80.0–100.0)
MCV: 62.9 fL — ABNORMAL LOW (ref 80.0–100.0)
Platelets: 168 10*3/uL (ref 150–400)
Platelets: 159 10*3/uL (ref 150–400)
RBC: 6.78 MIL/uL — ABNORMAL HIGH (ref 4.22–5.81)
RBC: 7.14 MIL/uL — ABNORMAL HIGH (ref 4.22–5.81)
RDW: 19.3 % — ABNORMAL HIGH (ref 11.5–15.5)
RDW: 19.7 % — ABNORMAL HIGH (ref 11.5–15.5)
WBC: 12.5 10*3/uL — ABNORMAL HIGH (ref 4.0–10.5)
WBC: 14.3 10*3/uL — ABNORMAL HIGH (ref 4.0–10.5)
nRBC: 0.7 % — ABNORMAL HIGH (ref 0.0–0.2)
nRBC: 1.8 % — ABNORMAL HIGH (ref 0.0–0.2)

## 2024-01-04 LAB — BASIC METABOLIC PANEL WITH GFR
Anion gap: 14 (ref 5–15)
Anion gap: 18 — ABNORMAL HIGH (ref 5–15)
Anion gap: 15 (ref 5–15)
BUN: 74 mg/dL — ABNORMAL HIGH (ref 8–23)
BUN: 70 mg/dL — ABNORMAL HIGH (ref 8–23)
BUN: 71 mg/dL — ABNORMAL HIGH (ref 8–23)
CO2: 31 mmol/L (ref 22–32)
CO2: 29 mmol/L (ref 22–32)
CO2: 29 mmol/L (ref 22–32)
Calcium: 8.3 mg/dL — ABNORMAL LOW (ref 8.9–10.3)
Calcium: 8.6 mg/dL — ABNORMAL LOW (ref 8.9–10.3)
Calcium: 8.9 mg/dL (ref 8.9–10.3)
Chloride: 82 mmol/L — ABNORMAL LOW (ref 98–111)
Chloride: 84 mmol/L — ABNORMAL LOW (ref 98–111)
Chloride: 84 mmol/L — ABNORMAL LOW (ref 98–111)
Creatinine, Ser: 1.88 mg/dL — ABNORMAL HIGH (ref 0.61–1.24)
Creatinine, Ser: 1.78 mg/dL — ABNORMAL HIGH (ref 0.61–1.24)
Creatinine, Ser: 1.86 mg/dL — ABNORMAL HIGH (ref 0.61–1.24)
GFR, Estimated: 35 mL/min — ABNORMAL LOW (ref >60)
GFR, Estimated: 38 mL/min — ABNORMAL LOW (ref >60)
GFR, Estimated: 36 mL/min — ABNORMAL LOW (ref >60)
Glucose, Bld: 125 mg/dL — ABNORMAL HIGH (ref 70–99)
Glucose, Bld: 136 mg/dL — ABNORMAL HIGH (ref 70–99)
Glucose, Bld: 173 mg/dL — ABNORMAL HIGH (ref 70–99)
Potassium: 2.6 mmol/L — CL (ref 3.5–5.1)
Potassium: 3.3 mmol/L — ABNORMAL LOW (ref 3.5–5.1)
Potassium: 4.9 mmol/L (ref 3.5–5.1)
Sodium: 127 mmol/L — ABNORMAL LOW (ref 135–145)
Sodium: 131 mmol/L — ABNORMAL LOW (ref 135–145)
Sodium: 128 mmol/L — ABNORMAL LOW (ref 135–145)

## 2024-01-04 LAB — COOXEMETRY PANEL
Carboxyhemoglobin: 2.1 % — ABNORMAL HIGH (ref 0.5–1.5)
Carboxyhemoglobin: 1.5 % (ref 0.5–1.5)
Methemoglobin: 1.3 % (ref 0.0–1.5)
Methemoglobin: 1 % (ref 0.0–1.5)
O2 Saturation: 67 %
O2 Saturation: 56.8 %
Total hemoglobin: 14.2 g/dL (ref 12.0–16.0)
Total hemoglobin: 15 g/dL (ref 12.0–16.0)

## 2024-01-04 LAB — PROTIME-INR
INR: 3.1 — ABNORMAL HIGH (ref 0.8–1.2)
INR: 3.2 — ABNORMAL HIGH (ref 0.8–1.2)
Prothrombin Time: 32 s — ABNORMAL HIGH (ref 11.4–15.2)
Prothrombin Time: 32.6 s — ABNORMAL HIGH (ref 11.4–15.2)

## 2024-01-04 LAB — MAGNESIUM
Magnesium: 2.6 mg/dL — ABNORMAL HIGH (ref 1.7–2.4)
Magnesium: 2.5 mg/dL — ABNORMAL HIGH (ref 1.7–2.4)

## 2024-01-04 LAB — POTASSIUM: Potassium: 3.2 mmol/L — ABNORMAL LOW (ref 3.5–5.1)

## 2024-01-04 MED ORDER — POTASSIUM CHLORIDE CRYS ER 20 MEQ PO TBCR
40.0000 meq | EXTENDED_RELEASE_TABLET | Freq: Once | ORAL | Status: AC
  Administered 2024-01-04: 40 meq via ORAL
  Filled 2024-01-04: qty 2

## 2024-01-04 MED ORDER — POTASSIUM CHLORIDE CRYS ER 20 MEQ PO TBCR
60.0000 meq | EXTENDED_RELEASE_TABLET | Freq: Once | ORAL | Status: AC
  Administered 2024-01-04: 60 meq via ORAL
  Filled 2024-01-04: qty 3

## 2024-01-04 MED ORDER — POTASSIUM CHLORIDE CRYS ER 20 MEQ PO TBCR
40.0000 meq | EXTENDED_RELEASE_TABLET | ORAL | Status: AC
  Administered 2024-01-04 (×2): 40 meq via ORAL
  Filled 2024-01-04 (×2): qty 2

## 2024-01-04 MED ORDER — ETOMIDATE 2 MG/ML IV SOLN
20.0000 mg | Freq: Once | INTRAVENOUS | Status: AC
  Administered 2024-01-04: 5 mg via INTRAVENOUS
  Filled 2024-01-04: qty 10

## 2024-01-04 MED ORDER — WARFARIN SODIUM 2.5 MG PO TABS
2.5000 mg | ORAL_TABLET | Freq: Once | ORAL | Status: AC
  Administered 2024-01-04: 2.5 mg via ORAL
  Filled 2024-01-04: qty 1

## 2024-01-04 MED ORDER — POTASSIUM CHLORIDE 10 MEQ/50ML IV SOLN
10.0000 meq | Freq: Once | INTRAVENOUS | Status: AC
  Administered 2024-01-04: 10 meq via INTRAVENOUS
  Filled 2024-01-04: qty 50

## 2024-01-04 NOTE — Progress Notes (Signed)
 Advanced Heart Failure Rounding Note   Subjective:    Moved to ICU on 6/11for cardiogenic shock.  LA 3.2 Co-ox 41% -> 32% -> 55%  Now on NE 1 milrinone  0.2. Co-ox 67%  Also on lasix  gtt at 20 and amio. Diuresed briskly overnight with 4.7 liters out. K 2.6   Scr improved 2.01 -> 1.88  Echo 6/12 EF 25-30% RV mod reduced  Remains in AF    Objective:   Weight Range:  Vital Signs:   Temp:  [97.7 F (36.5 C)-98.6 F (37 C)] 97.7 F (36.5 C) (06/13 0500) Pulse Rate:  [64-159] 85 (06/13 0700) Resp:  [12-31] 15 (06/13 0700) BP: (78-147)/(54-132) 99/58 (06/13 0700) SpO2:  [78 %-99 %] 94 % (06/13 0700) Weight:  [79.4 kg] 79.4 kg (06/13 0545) Last BM Date : 01/01/24  Weight change: Filed Weights   01/02/24 1828 01/03/24 0500 01/04/24 0545  Weight: 91.5 kg 82.3 kg 79.4 kg    Intake/Output:   Intake/Output Summary (Last 24 hours) at 01/04/2024 0704 Last data filed at 01/04/2024 0700 Gross per 24 hour  Intake 1565.66 ml  Output 4700 ml  Net -3134.34 ml     Physical Exam: General:  elderly male No resp difficulty HEENT: normal Neck: supple. JVP 10  LIJ TLC  Cor: irregular rate & rhythm. Mech s2 Lungs: clear Abdomen: soft, nontender, nondistended. No hepatosplenomegaly. No bruits or masses. Good bowel sounds. Extremities: no cyanosis, clubbing, rash, tr edema Neuro: alert & orientedx3, cranial nerves grossly intact. moves all 4 extremities w/o difficulty. Affect pleasant   Telemetry: AFL 90s Personally reviewed  Labs: Basic Metabolic Panel: Recent Labs  Lab 01/01/24 1124 01/01/24 1336 01/02/24 0426 01/03/24 0141 01/03/24 0449 01/04/24 0510  NA 132*  --  129* 123* 126* 127*  K 5.2*  --  5.5* 4.4 4.4 2.6*  CL 92*  --  93*  --  86* 82*  CO2 24  --  21*  --  25 31  GLUCOSE 190*  --  116*  --  139* 125*  BUN 60*  --  70*  --  81* 74*  CREATININE 2.14* 2.00* 1.96*  --  2.01* 1.88*  CALCIUM 9.0  --  9.1  --  8.8* 8.3*  MG 2.8*  --   --   --  2.6* 2.6*     Liver Function Tests: Recent Labs  Lab 01/01/24 1124 01/03/24 0449  AST 90* 110*  ALT 73* 124*  ALKPHOS 145* 158*  BILITOT 3.4* 3.2*  PROT 6.7 6.4*  ALBUMIN 3.7 3.4*   No results for input(s): LIPASE, AMYLASE in the last 168 hours. No results for input(s): AMMONIA in the last 168 hours.  CBC: Recent Labs  Lab 01/01/24 1124 01/02/24 0426 01/02/24 1441 01/03/24 0141 01/03/24 0449 01/04/24 0510  WBC 15.4* 16.0* 16.4*  --  18.2* 12.5*  NEUTROABS 13.3*  --   --   --   --   --   HGB 15.0 14.9 14.8 17.0 14.9 13.6  HCT 48.1 46.8 46.6 50.0 45.9 42.0  MCV 64.7* 63.5* 62.8*  --  62.2* 61.9*  PLT 162 168 172  --  180 168    Cardiac Enzymes: No results for input(s): CKTOTAL, CKMB, CKMBINDEX, TROPONINI in the last 168 hours.  BNP: BNP (last 3 results) Recent Labs    12/10/23 1619 12/25/23 0935 01/01/24 1124  BNP 3,213.0* 2,855.5* 3,066.5*    ProBNP (last 3 results) No results for input(s): PROBNP in the last 8760  hours.    Other results:  Imaging: ECHOCARDIOGRAM COMPLETE Result Date: 01/03/2024    ECHOCARDIOGRAM REPORT   Patient Name:   Adrian Lambert Date of Exam: 01/03/2024 Medical Rec #:  [id number]        Height:       70.0 in Accession #:    [id number]       Weight:       181.4 lb Date of Birth:  Dec 17, 1940       BSA:          2.003 m Patient Age:    82 years         BP:           93/77 mmHg Patient Gender: M                HR:           98 bpm. Exam Location:  Inpatient Procedure: 2D Echo, Cardiac Doppler, Color Doppler and Intracardiac            Opacification Agent (Both Spectral and Color Flow Doppler were            utilized during procedure). Indications:    CHF Acute systolic I50.21  History:        Patient has prior history of Echocardiogram examinations, most                 recent 01/17/2023.                 Aortic Valve: mechanical valve is present in the aortic                 position.  Sonographer:    Hersey Lorenzo RDCS Referring Phys:  [id number] BRITTAINY M SIMMONS IMPRESSIONS  1. Left ventricular ejection fraction, by estimation, is 25 to 30%. The left ventricle has severely decreased function. The left ventricle demonstrates global hypokinesis. The left ventricular internal cavity size was mildly to moderately dilated. Left ventricular diastolic function could not be evaluated.  2. Right ventricular systolic function is moderately reduced. The right ventricular size is severely enlarged. There is severely elevated pulmonary artery systolic pressure. The estimated right ventricular systolic pressure is 62.3 mmHg.  3. Left atrial size was moderately dilated.  4. Right atrial size was moderately dilated.  5. The mitral valve is degenerative. Mild mitral valve regurgitation. Moderate mitral annular calcification.  6. The aortic valve has been repaired/replaced. Aortic valve regurgitation is not visualized. There is a mechanical valve present in the aortic position. Aortic valve mean gradient measures 5.6 mmHg.  7. Aortic dilatation noted. There is moderate dilatation of the ascending aorta, measuring 45 mm.  8. The inferior vena cava is dilated in size with <50% respiratory variability, suggesting right atrial pressure of 15 mmHg. Comparison(s): Prior images reviewed side by side. Changes from prior study are noted. EF appears slightly reduced compared to prior. RV dilated, moderately reduced RV function, RVSP 62 mmHg. Conclusion(s)/Recommendation(s): No left ventricular mural or apical thrombus/thrombi. FINDINGS  Left Ventricle: Left ventricular ejection fraction, by estimation, is 25 to 30%. The left ventricle has severely decreased function. The left ventricle demonstrates global hypokinesis. Definity  contrast agent was given IV to delineate the left ventricular endocardial borders. The left ventricular internal cavity size was mildly to moderately dilated. There is no left ventricular hypertrophy. Left ventricular diastolic function could not be  evaluated due to mitral annular calcification (moderate or greater). Left ventricular diastolic function could not be evaluated. Right Ventricle:  The right ventricular size is severely enlarged. Right vetricular wall thickness was not well visualized. Right ventricular systolic function is moderately reduced. There is severely elevated pulmonary artery systolic pressure. The tricuspid regurgitant velocity is 3.44 m/s, and with an assumed right atrial pressure of 15 mmHg, the estimated right ventricular systolic pressure is 62.3 mmHg. Left Atrium: Left atrial size was moderately dilated. Right Atrium: Right atrial size was moderately dilated. Pericardium: There is no evidence of pericardial effusion. Mitral Valve: The mitral valve is degenerative in appearance. There is mild thickening of the mitral valve leaflet(s). There is mild calcification of the mitral valve leaflet(s). Moderate mitral annular calcification. Mild mitral valve regurgitation. Tricuspid Valve: The tricuspid valve is normal in structure. Tricuspid valve regurgitation is mild . No evidence of tricuspid stenosis. Aortic Valve: The aortic valve has been repaired/replaced. Aortic valve regurgitation is not visualized. Aortic valve mean gradient measures 5.6 mmHg. There is a mechanical valve present in the aortic position. Pulmonic Valve: The pulmonic valve was grossly normal. Pulmonic valve regurgitation is trivial. No evidence of pulmonic stenosis. Aorta: Aortic dilatation noted. There is moderate dilatation of the ascending aorta, measuring 45 mm. Venous: The inferior vena cava is dilated in size with less than 50% respiratory variability, suggesting right atrial pressure of 15 mmHg. IAS/Shunts: The atrial septum is grossly normal.  LEFT VENTRICLE PLAX 2D LVIDd:         5.80 cm LVIDs:         5.60 cm LV PW:         1.00 cm LV IVS:        1.00 cm LVOT diam:     2.10 cm LVOT Area:     3.46 cm  LV Volumes (MOD) LV vol d, MOD A2C: 228.0 ml LV vol d, MOD  A4C: 232.0 ml LV vol s, MOD A2C: 194.0 ml LV vol s, MOD A4C: 166.0 ml LV SV MOD A2C:     34.0 ml LV SV MOD A4C:     232.0 ml LV SV MOD BP:      35.8 ml RIGHT VENTRICLE         IVC TAPSE (M-mode): 0.9 cm  IVC diam: 2.80 cm LEFT ATRIUM             Index        RIGHT ATRIUM           Index LA diam:        5.10 cm 2.55 cm/m   RA Area:     24.50 cm LA Vol (A2C):   91.7 ml 45.79 ml/m  RA Volume:   77.20 ml  38.55 ml/m LA Vol (A4C):   49.1 ml 24.52 ml/m LA Biplane Vol: 71.6 ml 35.75 ml/m  AORTIC VALVE AV Mean Grad: 5.6 mmHg  AORTA Ao Root diam: 3.00 cm Ao Asc diam:  4.50 cm TRICUSPID VALVE TR Peak grad:   47.3 mmHg TR Vmax:        344.00 cm/s  SHUNTS Systemic Diam: 2.10 cm Sheryle Donning MD Electronically signed by Sheryle Donning MD Signature Date/Time: 01/03/2024/8:33:21 PM    Final    DG CHEST PORT 1 VIEW Result Date: 01/02/2024 CLINICAL DATA:  [id number].  Encounter for central line placement. EXAM: PORTABLE CHEST 1 VIEW COMPARISON:  Earlier study today at 6:35 p.m. FINDINGS: 9:02 p.m. Left IJ central line has been repositioned and now terminates in the mid to lower right atrium. No visible pneumothorax. Sternotomy sutures and AVR are again noted. Cardiomegaly.  Aortic atherosclerosis. The mediastinal configuration is stable. There is interval worsened perihilar vascular congestion, mid to lower zonal interstitial edema, and increasing small pleural effusions. There are increased perihilar opacities which are probably due to alveolar edema. Underlying pneumonia possible in the proper clinical setting. Findings are superimposed on changes of pulmonary fibrosis. There is thoracic spondylosis with no new osseous findings. IMPRESSION: 1. Left IJ central line has been repositioned and now terminates in the mid to lower right atrium. No visible pneumothorax. 2. Interval worsening of perihilar vascular congestion, mid to lower zonal interstitial edema, and increasing small pleural effusions. 3. Increased  perihilar opacities which are probably due to alveolar edema. Underlying pneumonia possible in the proper clinical setting. 4. Background of pulmonary fibrosis. 5. Aortic atherosclerosis. Electronically Signed   By: Denman Fischer M.D.   On: 01/02/2024 21:12   DG CHEST PORT 1 VIEW Result Date: 01/02/2024 CLINICAL DATA:  Central line placement EXAM: PORTABLE CHEST 1 VIEW COMPARISON:  01/01/2024 FINDINGS: Left central line is been placed. This courses into the right innominate vein and likely into the right internal jugular vein. No pneumothorax. Prior median sternotomy and valve replacement. Cardiomegaly. Stable interstitial prominence throughout the lungs, likely chronic interstitial lung disease. Small right pleural effusion. IMPRESSION: Left central line passes into the right innominate vein with the tip likely in the right internal jugular vein. No pneumothorax. Cardiomegaly. Stable chronic interstitial lung disease. Electronically Signed   By: Janeece Mechanic M.D.   On: 01/02/2024 18:45     Medications:     Scheduled Medications:  allopurinol   150 mg Oral Daily   aspirin  EC  81 mg Oral Daily   Chlorhexidine  Gluconate Cloth  6 each Topical Daily   Chlorhexidine  Gluconate Cloth  6 each Topical Daily   dapagliflozin  propanediol  10 mg Oral Daily   fesoterodine   4 mg Oral QODAY   mupirocin  ointment  1 Application Nasal BID   tamsulosin   0.4 mg Oral QODAY   Warfarin - Pharmacist Dosing Inpatient   Does not apply q1600    Infusions:  amiodarone  30 mg/hr (01/04/24 0700)   furosemide  (LASIX ) 200 mg in dextrose  5 % 100 mL (2 mg/mL) infusion 20 mg/hr (01/04/24 0700)   milrinone  0.2 mcg/kg/min (01/04/24 0700)   norepinephrine  (LEVOPHED ) Adult infusion 1 mcg/min (01/04/24 0700)    PRN Medications:    Assessment/Plan:    1. Acute on chronic systolic CHF -> cardiogenic shock:  - Coronary angiography in 1/23  mild CAD.  - Echo 6/24 showed EF 30-35%, RV mildly reduced.  - Echo 01/03/24  EF  25-30% RV mod reduced - Has developed recurrent HF/shock in setting of recurrent AF - Initial LA 2.1. Co-ox 41%  - Now on NE 1 milrinone  0.125. Co-ox 67% - Has diuresed well. Still mildly volume overloaded. Lasix  gtt on hold until K supped - Now off entresto , toprol  XL, and spiro. Continue to hold today - Continue Farxiga  10 mg daily.     2. Mechanical aortic valve: Continue warfarin and ASA 81. - INR 3.1 Discussed warfarin dosing with PharmD personally.   3. Paroxysmal Atrial fibrillation/atrial flutter:  - Admitted 1/18 with atrial fibrillation with RVR and CHF.  EF down to 20-25%.  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   - now with cardiogenic shock in setting of recurrent AF.  - On IV amio and now AF -> AFL - Plan DC-CV today   4. Ascending aortic aneurysm: 4.3 cm on 4/24 MRA chest.  5. AKI on CKD2: Creatinine 2.14 on admit. Baseline 1.5. Suspect cardio-renal.  - improved 2.01 -> 1.88 today - continue to support CO and BP w/ NE - follow BMET   6. Hyperkalemia - rec'd lokelma . Improved with hemodynamic support - K now low   7. Shock liver - likely 2/2 low output/hepatic congestion - inotropes + diuretics per above - improving   8. Hyponatremia/hypokalemia - limit FW - K supp ordered  9. ILD - CT reviewed with P/CCM  CRITICAL CARE Performed by: Jules Oar  Total critical care time: 40 minutes  Critical care time was exclusive of separately billable procedures and treating other patients.  Critical care was necessary to treat or prevent imminent or life-threatening deterioration.  Critical care was time spent personally by me (independent of midlevel providers or residents) on the following activities: development of treatment plan with patient and/or surrogate as well as nursing, discussions with consultants, evaluation of patient's response to treatment, examination of patient, obtaining history from patient or surrogate,  ordering and performing treatments and interventions, ordering and review of laboratory studies, ordering and review of radiographic studies, pulse oximetry and re-evaluation of patient's condition.   Length of Stay: 3  Jules Oar MD 01/04/2024, 7:04 AM  Advanced Heart Failure Team Pager [phone] (M-F; 7a - 4p)  Please contact [facility] for night-coverage after hours (4p -7a ) and weekends on [email or web address]

## 2024-01-04 NOTE — Plan of Care (Signed)
  Problem: Education: Goal: Knowledge of General Education information will improve Description: Including pain rating scale, medication(s)/side effects and non-pharmacologic comfort measures Outcome: Progressing   Problem: Health Behavior/Discharge Planning: Goal: Ability to manage health-related needs will improve Outcome: Not Progressing   Problem: Clinical Measurements: Goal: Ability to maintain clinical measurements within normal limits will improve Outcome: Not Progressing Goal: Will remain free from infection Outcome: Not Progressing Goal: Diagnostic test results will improve Outcome: Not Progressing Goal: Respiratory complications will improve Outcome: Progressing Goal: Cardiovascular complication will be avoided Outcome: Not Progressing   Problem: Activity: Goal: Risk for activity intolerance will decrease Outcome: Not Progressing   Problem: Nutrition: Goal: Adequate nutrition will be maintained Outcome: Progressing   Problem: Coping: Goal: Level of anxiety will decrease Outcome: Progressing   Problem: Elimination: Goal: Will not experience complications related to bowel motility Outcome: Progressing Goal: Will not experience complications related to urinary retention Outcome: Progressing   Problem: Pain Managment: Goal: General experience of comfort will improve and/or be controlled Outcome: Progressing   Problem: Safety: Goal: Ability to remain free from injury will improve Outcome: Not Progressing   Problem: Skin Integrity: Goal: Risk for impaired skin integrity will decrease Outcome: Progressing

## 2024-01-04 NOTE — Progress Notes (Signed)
 NAME:  Adrian Lambert, MRN:  [id number], DOB:  09-Jun-1941, LOS: 3 ADMISSION DATE:  01/01/2024, CONSULTATION DATE:  01/03/2024 REFERRING MD:  Dr. Darlyn Eke - [facility], CHIEF COMPLAINT: Cardiogenic shock  History of Present Illness:  Adrian Lambert is a 83 year old male with a past medical history significant for HFrEF, mechanical AVR MP AF on warfarin, ILD, BPH and HTN who presented to the ED at [facility] 6/10 for complaints of shortness of breath.  Workup was concerning for HFrEF exacerbation patient was admitted per hospitalist with heart failure consult.  Mid afternoon 6/11 patient was transferred to ICU for initiation of inotropic and vasopressor support.  PCCM consulted for ongoing medical management.  Pertinent  Medical History  HFrEF, mechanical AVR MP AF on warfarin, ILD, BPH and HTN   Significant Hospital Events: Including procedures, antibiotic start and stop dates in addition to other pertinent events   6/10 presented with complaints of shortness of breath concern for low flow state in the setting of known HFrEF  Interim History / Subjective:  Patient had slight epistaxis this morning, which has stopped Patient is stated breathing is better Denies chest pain or palpitation  Objective    Blood pressure 92/71, pulse 89, temperature 97.7 F (36.5 C), temperature source Axillary, resp. rate (!) 25, height 5' 10 (1.778 m), weight 79.4 kg, SpO2 95%. CVP:  [0 mmHg-15 mmHg] 0 mmHg      Intake/Output Summary (Last 24 hours) at 01/04/2024 0840 Last data filed at 01/04/2024 0800 Gross per 24 hour  Intake 1338.3 ml  Output 4450 ml  Net -3111.7 ml   Filed Weights   01/02/24 1828 01/03/24 0500 01/04/24 0545  Weight: 91.5 kg 82.3 kg 79.4 kg    Examination: General: Acute on chronically ill-appearing elderly male, lying on the bed HEENT: [place]/AT, eyes anicteric.  moist mucus membranes Neuro: Alert, awake following commands Chest: Bilateral crackles present, no wheezes or  rhonchi Heart: Irregularly irregular, no murmurs or gallops Abdomen: Soft, nontender, nondistended, bowel sounds present  Labs and images reviewed  Patient Lines/Drains/Airways Status     Active Line/Drains/Airways     Name Placement date Placement time Site Days   Peripheral IV 01/01/24 20 G Right Antecubital 01/01/24  --  Antecubital  3   Peripheral IV 01/02/24 22 G Anterior;Left;Proximal Forearm 01/02/24  1750  Forearm  2   CVC Triple Lumen 01/02/24 Left Internal jugular 0 cm 0 cm 01/02/24  1807  -- 2   External Urinary Catheter 01/03/24  1200  --  1   Pressure Injury 01/02/24 Coccyx Left Stage 1 -  Intact skin with non-blanchable redness of a localized area usually over a bony prominence. pink not blanching 01/02/24  1750  -- 2   Wound / Incision (Open or Dehisced) 01/02/24 Pretibial Proximal;Right Yellow escar/slough 01/02/24  1400  Pretibial  2         Resolved problem list   Assessment and Plan  Acute on chronic biventricular systolic CHF with cardiogenic shock Advanced heart failure team is following EF is 30 to 35% with RV dysfunction GDMT as tolerated Lasix  infusion was stopped this morning Coox improved to 67% Remain on milrinone  at 0.2, Levophed  was titrated down to 1 Lactate has cleared Continue aspirin   Status post mechanical aortic valve anticoagulated at baseline with warfarin Continue warfarin Pharmacy to manage  Paroxysmal atrial fibrillation/atrial flutter anticoagulated baseline Continue telemetry Continue warfarin Optimize electrolytes Will need DCCV when stable  Acute 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  Baseline creatinine 1.3-1.7 Initially presented with hyperkalemia, now hypokalemic hypokalemic Hypervolemic hyponatremia Serum creatinine started improving, monitor intake and output Avoid nephrotoxic agent Continue aggressive electrolyte replacement  Shock liver Closely monitor LFTs Avoid hepatotoxic agent  Ascending aortic  aneurysm Outpatient follow-up with vascular surgery  Best Practice (right click and Reselect all SmartList Selections daily)   Diet/type: NPO for DCCV DVT prophylaxis Coumadin  Pressure ulcer(s): Please see nursing notes GI prophylaxis: NA Lines: Central line Foley:  Yes, and it is still needed Code Status:  full code Last date of multidisciplinary goals of care discussion: Continue to update patient and family daily  Labs   CBC: Recent Labs  Lab 01/01/24 1124 01/02/24 0426 01/02/24 1441 01/03/24 0141 01/03/24 0449 01/04/24 0510  WBC 15.4* 16.0* 16.4*  --  18.2* 12.5*  NEUTROABS 13.3*  --   --   --   --   --   HGB 15.0 14.9 14.8 17.0 14.9 13.6  HCT 48.1 46.8 46.6 50.0 45.9 42.0  MCV 64.7* 63.5* 62.8*  --  62.2* 61.9*  PLT 162 168 172  --  180 168    Basic Metabolic Panel: Recent Labs  Lab 01/01/24 1124 01/01/24 1336 01/02/24 0426 01/03/24 0141 01/03/24 0449 01/04/24 0510  NA 132*  --  129* 123* 126* 127*  K 5.2*  --  5.5* 4.4 4.4 2.6*  CL 92*  --  93*  --  86* 82*  CO2 24  --  21*  --  25 31  GLUCOSE 190*  --  116*  --  139* 125*  BUN 60*  --  70*  --  81* 74*  CREATININE 2.14* 2.00* 1.96*  --  2.01* 1.88*  CALCIUM 9.0  --  9.1  --  8.8* 8.3*  MG 2.8*  --   --   --  2.6* 2.6*   GFR: Estimated Creatinine Clearance: 31.3 mL/min (A) (by C-G formula based on SCr of 1.88 mg/dL (H)). Recent Labs  Lab 01/02/24 0426 01/02/24 1441 01/02/24 2120 01/03/24 0142 01/03/24 0449 01/03/24 0454 01/04/24 0510  PROCALCITON  --  0.33  --   --   --   --   --   WBC 16.0* 16.4*  --   --  18.2*  --  12.5*  LATICACIDVEN  --   --  2.0* 2.1*  --  1.5  --     Liver Function Tests: Recent Labs  Lab 01/01/24 1124 01/03/24 0449  AST 90* 110*  ALT 73* 124*  ALKPHOS 145* 158*  BILITOT 3.4* 3.2*  PROT 6.7 6.4*  ALBUMIN 3.7 3.4*   No results for input(s): LIPASE, AMYLASE in the last 168 hours. No results for input(s): AMMONIA in the last 168 hours.  ABG     Component Value Date/Time   HCO3 26.2 01/03/2024 0141   TCO2 27 01/03/2024 0141   O2SAT 67 01/04/2024 0516     Coagulation Profile: Recent Labs  Lab 01/01/24 1650 01/02/24 0426 01/03/24 0449 01/04/24 0510  INR 2.9* 3.1* 3.0* 3.1*    Cardiac Enzymes: No results for input(s): CKTOTAL, CKMB, CKMBINDEX, TROPONINI in the last 168 hours.  HbA1C: No results found for: HGBA1C  CBG: No results for input(s): GLUCAP in the last 168 hours.     Trevor Fudge, MD [facility] See Amion for pager If no response to pager, please call [phone] until 7pm After 7pm, Please call E-link [phone]

## 2024-01-04 NOTE — CV Procedure (Signed)
    DIRECT CURRENT CARDIOVERSION  NAME:  Adrian Lambert   MRN: [id number] DOB:  November 09, 1940   ADMIT DATE: 01/01/2024   INDICATIONS: Atrial flutter   PROCEDURE:   Informed consent was obtained prior to the procedure. The risks, benefits and alternatives for the procedure were discussed and the patient comprehended these risks. Once an appropriate time out was taken, the patient had the defibrillator pads placed in the anterior and posterior position. The patient then underwent sedation by the CCM service. Once an appropriate level of sedation was achieved, the patient received a single biphasic, synchronized 150J shock with prompt conversion however patient did not convert to sinus rhythm. We repeated with a 200J shock but still failed to convert.  No apparent complications.  Jules Oar, MD  12:28 PM

## 2024-01-04 NOTE — Progress Notes (Signed)
 ANTICOAGULATION CONSULT NOTE  Pharmacy Consult for Warfarin Indication: afib with mechanical AVR  Allergies  Allergen Reactions   Celecoxib Rash    Other Reaction(s): Unknown    Patient Measurements: Height: 5' 10 (177.8 cm) Weight: 79.4 kg (175 lb 0.7 oz) IBW/kg (Calculated) : 73  Vital Signs: Temp: 97 F (36.1 C) (06/13 1100) Temp Source: Axillary (06/13 1100) BP: 95/68 (06/13 1015) Pulse Rate: 85 (06/13 1015)  Labs: Recent Labs    01/01/24 1336 01/01/24 1650 01/02/24 0426 01/02/24 1441 01/03/24 0141 01/03/24 0449 01/04/24 0510  HGB  --    < > 14.9 14.8 17.0 14.9 13.6  HCT  --    < > 46.8 46.6 50.0 45.9 42.0  PLT  --    < > 168 172  --  180 168  LABPROT  --    < > 31.9*  --   --  31.1* 32.0*  INR  --    < > 3.1*  --   --  3.0* 3.1*  CREATININE 2.00*  --  1.96*  --   --  2.01* 1.88*  TROPONINIHS 41*  --   --   --   --   --   --    < > = values in this interval not displayed.    Estimated Creatinine Clearance: 31.3 mL/min (A) (by C-G formula based on SCr of 1.88 mg/dL (H)).   Medical History: Past Medical History:  Diagnosis Date   Anticoagulant long-term use    Arthritis    probably in my thumbs (06/26/2012)   Bifascicular block    Chronic combined systolic and diastolic CHF (congestive heart failure) (HCC)    a. 01/2016 Echo: EF 45-50%, gr2 DD, inflat AK (poor acoustic windows);  b. 07/2016 Echo: EF 25-30%, mild LVH, PASP (pt in Afib).   Complication of anesthesia    wake up w/a start; hallucinations (06/26/2012)   Critical Aortic Stenosis    a. 03/2003 s/p SJM #25 mech prosthetic AoV;  b. 07/2016 Echo: EF 25-30% (in setting of AF), AoV area 1.72 cm^2 (VTI), 1.75 cm^2 (Vmean).   Diverticulosis    Gouty arthritis    have had it in both feet, ankles, right knee (06/26/2012)   History of diverticulitis of colon    History of kidney stones    History of pancreatitis    Hypertension    Incomplete RBBB    PAF (paroxysmal atrial fibrillation) (HCC)     a. CHA2DS2VASc = 4-->chronic coumadin  in setting of mech AoV;  b. 07/2016 Recurrent AF RVR.    Medications:  Medications Prior to Admission  Medication Sig Dispense Refill Last Dose/Taking   acetaminophen  (TYLENOL ) 500 MG tablet Take 500 mg by mouth 2 (two) times daily.   01/01/2024   albuterol  (VENTOLIN  HFA) 108 (90 Base) MCG/ACT inhaler Inhale 2 puffs into the lungs every 4 (four) hours.   01/01/2024   allopurinol  (ZYLOPRIM ) 300 MG tablet Take 150 mg by mouth daily.   01/01/2024   aspirin  EC 81 MG tablet Take 81 mg by mouth daily. Swallow whole.   01/01/2024   clobetasol cream (TEMOVATE) 0.05 % Apply 1 Application topically 2 (two) times daily.   01/01/2024   colchicine  0.6 MG tablet Take 1 tablet (0.6 mg total) by mouth daily as needed (for gout).   Unknown   FARXIGA  10 MG TABS tablet TAKE 1 TABLET BY MOUTH EVERY DAY 30 tablet 6 01/01/2024   FIBER PO Take 2 capsules by mouth 2 (two) times  daily.   01/01/2024   folic acid  (FOLVITE ) 1 MG tablet TAKE 1 TABLET(1 MG) BY MOUTH DAILY 90 tablet 3 01/01/2024   metoprolol  succinate (TOPROL -XL) 25 MG 24 hr tablet TAKE 1 TABLET BY MOUTH EVERYDAY AT BEDTIME 90 tablet 1 12/31/2023   potassium chloride  SA (KLOR-CON  M) 20 MEQ tablet Take 1 tablet (20 mEq total) by mouth daily.   01/01/2024   tamsulosin  (FLOMAX ) 0.4 MG CAPS capsule Take 1 capsule (0.4 mg total) by mouth every other day. 90 capsule 3 01/01/2024   tolterodine  (DETROL  LA) 4 MG 24 hr capsule Take 1 capsule (4 mg total) by mouth every other day. Alternating days with the tamsulosin  90 capsule 3 12/31/2023   torsemide  (DEMADEX ) 20 MG tablet Take 3 tablets (60 mg total) by mouth daily.   01/01/2024   warfarin (COUMADIN ) 5 MG tablet Take 0.5-1 tablets (2.5-5 mg total) by mouth See admin instructions. 1/19 and 1/20  take 7.5mg  (1 and 1/2 tab) then take 1 tab (5mg ) daily until seen by MD  Take 1 tablet (5 mg) by mouth on Sundays, Mondays, Wednesdays, Thursdays & Saturdays in the evening. Take 0.5 tablet (2.5 mg)  by mouth on Tuesdays & Fridays in the evening. (Patient taking differently: Take 2.5-5 mg by mouth See admin instructions. AS directed by PCP 2.5 mg Daily) 45 tablet 11 12/31/2023 at  8:00 PM   docusate sodium  (COLACE) 100 MG capsule Take 100 mg by mouth 2 (two) times daily. (Patient not taking: Reported on 01/01/2024)   Not Taking   polyethylene glycol powder (GLYCOLAX /MIRALAX ) 17 GM/SCOOP powder Take 17 g by mouth 2 (two) times daily as needed for moderate constipation or mild constipation. (Patient not taking: Reported on 12/26/2023) 238 g 2    Scheduled:   allopurinol   150 mg Oral Daily   aspirin  EC  81 mg Oral Daily   Chlorhexidine  Gluconate Cloth  6 each Topical Daily   dapagliflozin  propanediol  10 mg Oral Daily   fesoterodine   4 mg Oral QODAY   mupirocin  ointment  1 Application Nasal BID   tamsulosin   0.4 mg Oral QODAY   warfarin  2.5 mg Oral ONCE-1600   Warfarin - Pharmacist Dosing Inpatient   Does not apply q1600   Infusions:   amiodarone  60 mg/hr (01/04/24 1200)   furosemide  (LASIX ) 200 mg in dextrose  5 % 100 mL (2 mg/mL) infusion 20 mg/hr (01/04/24 1200)   milrinone  0.2 mcg/kg/min (01/04/24 1209)   norepinephrine  (LEVOPHED ) Adult infusion Stopped (01/04/24 1207)   PRN:   Assessment: 83 yo male presenting with worsening shortness of breath. On warfarin PTA for afib with mechanical AVR.    Per patient, dose is 5 mg daily, poor historian. Upon chart review patient should be on 2.5 mg once daily on Monday, Tuesday, Thursday, Friday, and Sunday and 1 mg on Wednesday and Saturday per last PCP note on 12/18/2023. Goal INR is 2.5-3.5.   INR today is 3.1 which is therapeutic. CBC remains stable.  Goal of Therapy:  INR 2.5-3.5 Monitor platelets by anticoagulation protocol: Yes   Plan:  Will give 2.5 mg warfarin today based on PTA dose Monitor for s/s of hemorrhage, daily INR, CBC Watch for new DDIs  Thank you for involving pharmacy in the patient's care.   Joanell Mowers, Davey Erp, BCCP Clinical Pharmacist  01/04/2024 12:28 PM   [facility] pharmacy phone numbers are listed on [email or web address]

## 2024-01-04 NOTE — Procedures (Signed)
 Moderate Conscious Sedation Note    Adrian Lambert  [id number]  19-Dec-1940   Date:01/04/24  Time:12:24 PM   Provider Performing:Analeise Mccleery   Procedure: Moderate Conscious Sedation  During this procedure the patient is administered a total of Etomidate 5mg  to achieve and maintain moderate conscious sedation. The patient's heart rate, blood pressure, and oxygen saturation are monitored continuously during the procedure. The period of conscious sedation is 20 minutes, of which I was present face-to-face 100% of this time.

## 2024-01-04 NOTE — Progress Notes (Addendum)
 0715 on arrival to room noted bleeding from left nostril patient face cleansed and humidification added to patients oxygen. 0800 02 removed my patient nose bleeding again SP02 WNL will leave  02 off at this time 1145 patient cardioverted x2 unsuccessful pre meds given levo also restarted for procedure and sedation mediations. Pt tolerated well increased amio gtt pre cc team

## 2024-01-05 DIAGNOSIS — I5023 Acute on chronic systolic (congestive) heart failure: Secondary | ICD-10-CM | POA: Diagnosis not present

## 2024-01-05 LAB — BASIC METABOLIC PANEL WITH GFR
Anion gap: 16 — ABNORMAL HIGH (ref 5–15)
BUN: 73 mg/dL — ABNORMAL HIGH (ref 8–23)
CO2: 26 mmol/L (ref 22–32)
Calcium: 8.8 mg/dL — ABNORMAL LOW (ref 8.9–10.3)
Chloride: 83 mmol/L — ABNORMAL LOW (ref 98–111)
Creatinine, Ser: 1.92 mg/dL — ABNORMAL HIGH (ref 0.61–1.24)
GFR, Estimated: 34 mL/min — ABNORMAL LOW (ref >60)
Glucose, Bld: 173 mg/dL — ABNORMAL HIGH (ref 70–99)
Potassium: 5.3 mmol/L — ABNORMAL HIGH (ref 3.5–5.1)
Sodium: 125 mmol/L — ABNORMAL LOW (ref 135–145)

## 2024-01-05 LAB — CBC
HCT: 43.1 % (ref 39.0–52.0)
Hemoglobin: 14 g/dL (ref 13.0–17.0)
MCH: 20.3 pg — ABNORMAL LOW (ref 26.0–34.0)
MCHC: 32.5 g/dL (ref 30.0–36.0)
MCV: 62.6 fL — ABNORMAL LOW (ref 80.0–100.0)
Platelets: 164 10*3/uL (ref 150–400)
RBC: 6.89 MIL/uL — ABNORMAL HIGH (ref 4.22–5.81)
RDW: 19.4 % — ABNORMAL HIGH (ref 11.5–15.5)
WBC: 15.1 10*3/uL — ABNORMAL HIGH (ref 4.0–10.5)
nRBC: 1.8 % — ABNORMAL HIGH (ref 0.0–0.2)

## 2024-01-05 LAB — PROTIME-INR
INR: 2.8 — ABNORMAL HIGH (ref 0.8–1.2)
Prothrombin Time: 29.8 s — ABNORMAL HIGH (ref 11.4–15.2)

## 2024-01-05 LAB — MAGNESIUM: Magnesium: 2.5 mg/dL — ABNORMAL HIGH (ref 1.7–2.4)

## 2024-01-05 LAB — COOXEMETRY PANEL
Carboxyhemoglobin: 1.6 % — ABNORMAL HIGH (ref 0.5–1.5)
Methemoglobin: 0.9 % (ref 0.0–1.5)
O2 Saturation: 61.8 %
Total hemoglobin: 14.5 g/dL (ref 12.0–16.0)

## 2024-01-05 MED ORDER — MELATONIN 3 MG PO TABS
3.0000 mg | ORAL_TABLET | Freq: Once | ORAL | Status: AC
  Administered 2024-01-05: 3 mg via ORAL
  Filled 2024-01-05: qty 1

## 2024-01-05 MED ORDER — WARFARIN SODIUM 1 MG PO TABS
1.0000 mg | ORAL_TABLET | Freq: Once | ORAL | Status: AC
  Administered 2024-01-05: 1 mg via ORAL
  Filled 2024-01-05: qty 1

## 2024-01-05 MED ORDER — TRAZODONE HCL 50 MG PO TABS
50.0000 mg | ORAL_TABLET | Freq: Every evening | ORAL | Status: DC | PRN
  Administered 2024-01-05 – 2024-01-19 (×12): 50 mg via ORAL
  Filled 2024-01-05 (×12): qty 1

## 2024-01-05 MED ORDER — FUROSEMIDE 10 MG/ML IJ SOLN
80.0000 mg | Freq: Two times a day (BID) | INTRAMUSCULAR | Status: DC
  Administered 2024-01-05 – 2024-01-06 (×3): 80 mg via INTRAVENOUS
  Filled 2024-01-05 (×4): qty 8

## 2024-01-05 NOTE — Significant Event (Signed)
 Contacted by nurse covering Adrian Lambert and informed that his urination has decreased significantly.  It appears that his creatinine is getting worse in the setting of worsening hyponatremia.  Bladder scan not concerning for obstruction.  Informed nurse to hold furosemide  drip at this time with day time heart failure team to reassess.

## 2024-01-05 NOTE — Plan of Care (Signed)
  Problem: Education: Goal: Knowledge of General Education information will improve Description: Including pain rating scale, medication(s)/side effects and non-pharmacologic comfort measures Outcome: Progressing   Problem: Health Behavior/Discharge Planning: Goal: Ability to manage health-related needs will improve Outcome: Progressing   Problem: Clinical Measurements: Goal: Ability to maintain clinical measurements within normal limits will improve Outcome: Progressing Goal: Diagnostic test results will improve Outcome: Progressing Goal: Respiratory complications will improve Outcome: Progressing Goal: Cardiovascular complication will be avoided Outcome: Progressing   Problem: Activity: Goal: Risk for activity intolerance will decrease Outcome: Progressing   Problem: Nutrition: Goal: Adequate nutrition will be maintained Outcome: Progressing   Problem: Pain Managment: Goal: General experience of comfort will improve and/or be controlled Outcome: Progressing   Problem: Safety: Goal: Ability to remain free from injury will improve Outcome: Progressing

## 2024-01-05 NOTE — Progress Notes (Signed)
 ANTICOAGULATION CONSULT NOTE  Pharmacy Consult for Warfarin Indication: afib with mechanical AVR  Allergies  Allergen Reactions   Celecoxib Rash    Other Reaction(s): Unknown    Patient Measurements: Height: 5' 10 (177.8 cm) Weight: 82.9 kg (182 lb 12.2 oz) IBW/kg (Calculated) : 73  Vital Signs: Temp: 97.9 F (36.6 C) (06/14 0928) Temp Source: Oral (06/14 0928) BP: 98/81 (06/14 0800) Pulse Rate: 81 (06/14 1000)  Labs: Recent Labs    01/04/24 0510 01/04/24 1255 01/04/24 1918 01/05/24 0441  HGB 13.6  --  14.5 14.0  HCT 42.0  --  44.9 43.1  PLT 168  --  159 164  LABPROT 32.0*  --  32.6* 29.8*  INR 3.1*  --  3.2* 2.8*  CREATININE 1.88* 1.78* 1.86* 1.92*    Estimated Creatinine Clearance: 30.6 mL/min (A) (by C-G formula based on SCr of 1.92 mg/dL (H)).   Medical History: Past Medical History:  Diagnosis Date   Anticoagulant long-term use    Arthritis    probably in my thumbs (06/26/2012)   Bifascicular block    Chronic combined systolic and diastolic CHF (congestive heart failure) (HCC)    a. 01/2016 Echo: EF 45-50%, gr2 DD, inflat AK (poor acoustic windows);  b. 07/2016 Echo: EF 25-30%, mild LVH, PASP (pt in Afib).   Complication of anesthesia    wake up w/a start; hallucinations (06/26/2012)   Critical Aortic Stenosis    a. 03/2003 s/p SJM #25 mech prosthetic AoV;  b. 07/2016 Echo: EF 25-30% (in setting of AF), AoV area 1.72 cm^2 (VTI), 1.75 cm^2 (Vmean).   Diverticulosis    Gouty arthritis    have had it in both feet, ankles, right knee (06/26/2012)   History of diverticulitis of colon    History of kidney stones    History of pancreatitis    Hypertension    Incomplete RBBB    PAF (paroxysmal atrial fibrillation) (HCC)    a. CHA2DS2VASc = 4-->chronic coumadin  in setting of mech AoV;  b. 07/2016 Recurrent AF RVR.    Medications:  Medications Prior to Admission  Medication Sig Dispense Refill Last Dose/Taking   acetaminophen  (TYLENOL ) 500 MG  tablet Take 500 mg by mouth 2 (two) times daily.   01/01/2024   albuterol  (VENTOLIN  HFA) 108 (90 Base) MCG/ACT inhaler Inhale 2 puffs into the lungs every 4 (four) hours.   01/01/2024   allopurinol  (ZYLOPRIM ) 300 MG tablet Take 150 mg by mouth daily.   01/01/2024   aspirin  EC 81 MG tablet Take 81 mg by mouth daily. Swallow whole.   01/01/2024   clobetasol cream (TEMOVATE) 0.05 % Apply 1 Application topically 2 (two) times daily.   01/01/2024   colchicine  0.6 MG tablet Take 1 tablet (0.6 mg total) by mouth daily as needed (for gout).   Unknown   FARXIGA  10 MG TABS tablet TAKE 1 TABLET BY MOUTH EVERY DAY 30 tablet 6 01/01/2024   FIBER PO Take 2 capsules by mouth 2 (two) times daily.   01/01/2024   folic acid  (FOLVITE ) 1 MG tablet TAKE 1 TABLET(1 MG) BY MOUTH DAILY 90 tablet 3 01/01/2024   metoprolol  succinate (TOPROL -XL) 25 MG 24 hr tablet TAKE 1 TABLET BY MOUTH EVERYDAY AT BEDTIME 90 tablet 1 12/31/2023   potassium chloride  SA (KLOR-CON  M) 20 MEQ tablet Take 1 tablet (20 mEq total) by mouth daily.   01/01/2024   tamsulosin  (FLOMAX ) 0.4 MG CAPS capsule Take 1 capsule (0.4 mg total) by mouth every other day. 90  capsule 3 01/01/2024   tolterodine  (DETROL  LA) 4 MG 24 hr capsule Take 1 capsule (4 mg total) by mouth every other day. Alternating days with the tamsulosin  90 capsule 3 12/31/2023   torsemide  (DEMADEX ) 20 MG tablet Take 3 tablets (60 mg total) by mouth daily.   01/01/2024   warfarin (COUMADIN ) 5 MG tablet Take 0.5-1 tablets (2.5-5 mg total) by mouth See admin instructions. 1/19 and 1/20  take 7.5mg  (1 and 1/2 tab) then take 1 tab (5mg ) daily until seen by MD  Take 1 tablet (5 mg) by mouth on Sundays, Mondays, Wednesdays, Thursdays & Saturdays in the evening. Take 0.5 tablet (2.5 mg) by mouth on Tuesdays & Fridays in the evening. (Patient taking differently: Take 2.5-5 mg by mouth See admin instructions. AS directed by PCP 2.5 mg Daily) 45 tablet 11 12/31/2023 at  8:00 PM   docusate sodium  (COLACE) 100 MG  capsule Take 100 mg by mouth 2 (two) times daily. (Patient not taking: Reported on 01/01/2024)   Not Taking   polyethylene glycol powder (GLYCOLAX /MIRALAX ) 17 GM/SCOOP powder Take 17 g by mouth 2 (two) times daily as needed for moderate constipation or mild constipation. (Patient not taking: Reported on 12/26/2023) 238 g 2    Scheduled:   allopurinol   150 mg Oral Daily   aspirin  EC  81 mg Oral Daily   Chlorhexidine  Gluconate Cloth  6 each Topical Daily   dapagliflozin  propanediol  10 mg Oral Daily   fesoterodine   4 mg Oral QODAY   furosemide   80 mg Intravenous BID   mupirocin  ointment  1 Application Nasal BID   tamsulosin   0.4 mg Oral QODAY   Warfarin - Pharmacist Dosing Inpatient   Does not apply q1600   Infusions:   amiodarone  60 mg/hr (01/05/24 1014)   milrinone  0.2 mcg/kg/min (01/05/24 1000)   norepinephrine  (LEVOPHED ) Adult infusion Stopped (01/04/24 1207)   PRN:   Assessment: 83 yo male presenting with worsening shortness of breath. On warfarin PTA for afib with mechanical AVR.    Per patient, dose is 5 mg daily, poor historian. Upon chart review patient should be on 2.5 mg once daily on Monday, Tuesday, Thursday, Friday, and Sunday and 1 mg on Wednesday and Saturday per last PCP note on 12/18/2023. Goal INR is 2.5-3.5.   INR today is 2.8 which is therapeutic. CBC remains stable. Pertinent DDI's - amio IV @60  (none PTA, 6/12 > ), allopurinol  (PTA)  Goal of Therapy:  INR 2.5-3.5 Monitor platelets by anticoagulation protocol: Yes   Plan:  Warfarin 1 mg x 1 (continuation of PTA dose) Monitor for s/s of hemorrhage, daily INR, CBC Watch for new DDIs  Thank you for involving pharmacy in the patient's care.   Cecillia Cogan, PharmD Clinical Pharmacist 01/05/2024  10:49 AM

## 2024-01-05 NOTE — Progress Notes (Signed)
 Advanced Heart Failure Rounding Note   Subjective:    Moved to ICU on 6/11for cardiogenic shock.  LA 3.2 Co-ox 41% -> 32% -> 55% - 01/04/24 Failed DC-CV for AF/AFL   Remains on milrinone  0.2. Off NE. Co-ox 62% CVP 11  Back in NSR this am on amio 60/hr. Feels ok. Denies CP or SOB.    Objective:   Weight Range:  Vital Signs:   Temp:  [96.8 F (36 C)-97.9 F (36.6 C)] 97.9 F (36.6 C) (06/14 0928) Pulse Rate:  [69-114] 81 (06/14 1000) Resp:  [13-41] 21 (06/14 1000) BP: (81-118)/(57-84) 98/81 (06/14 0800) SpO2:  [73 %-98 %] 96 % (06/14 1000) Weight:  [82.9 kg] 82.9 kg (06/14 0449) Last BM Date : 01/01/24  Weight change: Filed Weights   01/03/24 0500 01/04/24 0545 01/05/24 0449  Weight: 82.3 kg 79.4 kg 82.9 kg    Intake/Output:   Intake/Output Summary (Last 24 hours) at 01/05/2024 1047 Last data filed at 01/05/2024 1000 Gross per 24 hour  Intake 1978.43 ml  Output 1500 ml  Net 478.43 ml     Physical Exam: General:  Elderly No resp difficulty HEENT: normal Neck: supple. CVP 11  Cor: PMI nondisplaced. Regular rate & rhythm. No rubs, gallops or murmurs. Lungs: clear Abdomen: soft, nontender, nondistended. No hepatosplenomegaly. No bruits or masses. Good bowel sounds. Extremities: no cyanosis, clubbing, rash, edema Neuro: alert & orientedx3, cranial nerves grossly intact. moves all 4 extremities w/o difficulty. Affect pleasant   Telemetry: Sinus 70-80s Personally reviewed  Labs: Basic Metabolic Panel: Recent Labs  Lab 01/01/24 1124 01/01/24 1336 01/03/24 0449 01/04/24 0510 01/04/24 1008 01/04/24 1255 01/04/24 1918 01/05/24 0441  NA 132*   < > 126* 127*  --  131* 128* 125*  K 5.2*   < > 4.4 2.6* 3.2* 3.3* 4.9 5.3*  CL 92*   < > 86* 82*  --  84* 84* 83*  CO2 24   < > 25 31  --  29 29 26   GLUCOSE 190*   < > 139* 125*  --  136* 173* 173*  BUN 60*   < > 81* 74*  --  70* 71* 73*  CREATININE 2.14*   < > 2.01* 1.88*  --  1.78* 1.86* 1.92*  CALCIUM 9.0   <  > 8.8* 8.3*  --  8.6* 8.9 8.8*  MG 2.8*  --  2.6* 2.6*  --   --  2.5* 2.5*   < > = values in this interval not displayed.    Liver Function Tests: Recent Labs  Lab 01/01/24 1124 01/03/24 0449  AST 90* 110*  ALT 73* 124*  ALKPHOS 145* 158*  BILITOT 3.4* 3.2*  PROT 6.7 6.4*  ALBUMIN 3.7 3.4*   No results for input(s): LIPASE, AMYLASE in the last 168 hours. No results for input(s): AMMONIA in the last 168 hours.  CBC: Recent Labs  Lab 01/01/24 1124 01/02/24 0426 01/02/24 1441 01/03/24 0141 01/03/24 0449 01/04/24 0510 01/04/24 1918 01/05/24 0441  WBC 15.4*   < > 16.4*  --  18.2* 12.5* 14.3* 15.1*  NEUTROABS 13.3*  --   --   --   --   --   --   --   HGB 15.0   < > 14.8 17.0 14.9 13.6 14.5 14.0  HCT 48.1   < > 46.6 50.0 45.9 42.0 44.9 43.1  MCV 64.7*   < > 62.8*  --  62.2* 61.9* 62.9* 62.6*  PLT 162   < >  172  --  180 168 159 164   < > = values in this interval not displayed.    Cardiac Enzymes: No results for input(s): CKTOTAL, CKMB, CKMBINDEX, TROPONINI in the last 168 hours.  BNP: BNP (last 3 results) Recent Labs    12/10/23 1619 12/25/23 0935 01/01/24 1124  BNP 3,213.0* 2,855.5* 3,066.5*    ProBNP (last 3 results) No results for input(s): PROBNP in the last 8760 hours.    Other results:  Imaging: ECHOCARDIOGRAM COMPLETE Result Date: 01/03/2024    ECHOCARDIOGRAM REPORT   Patient Name:   Adrian Lambert Date of Exam: 01/03/2024 Medical Rec #:  [id number]        Height:       70.0 in Accession #:    [id number]       Weight:       181.4 lb Date of Birth:  11-19-1940       BSA:          2.003 m Patient Age:    82 years         BP:           93/77 mmHg Patient Gender: M                HR:           98 bpm. Exam Location:  Inpatient Procedure: 2D Echo, Cardiac Doppler, Color Doppler and Intracardiac            Opacification Agent (Both Spectral and Color Flow Doppler were            utilized during procedure). Indications:    CHF Acute systolic  I50.21  History:        Patient has prior history of Echocardiogram examinations, most                 recent 01/17/2023.                 Aortic Valve: mechanical valve is present in the aortic                 position.  Sonographer:    Hersey Lorenzo RDCS Referring Phys: [id number] BRITTAINY M SIMMONS IMPRESSIONS  1. Left ventricular ejection fraction, by estimation, is 25 to 30%. The left ventricle has severely decreased function. The left ventricle demonstrates global hypokinesis. The left ventricular internal cavity size was mildly to moderately dilated. Left ventricular diastolic function could not be evaluated.  2. Right ventricular systolic function is moderately reduced. The right ventricular size is severely enlarged. There is severely elevated pulmonary artery systolic pressure. The estimated right ventricular systolic pressure is 62.3 mmHg.  3. Left atrial size was moderately dilated.  4. Right atrial size was moderately dilated.  5. The mitral valve is degenerative. Mild mitral valve regurgitation. Moderate mitral annular calcification.  6. The aortic valve has been repaired/replaced. Aortic valve regurgitation is not visualized. There is a mechanical valve present in the aortic position. Aortic valve mean gradient measures 5.6 mmHg.  7. Aortic dilatation noted. There is moderate dilatation of the ascending aorta, measuring 45 mm.  8. The inferior vena cava is dilated in size with <50% respiratory variability, suggesting right atrial pressure of 15 mmHg. Comparison(s): Prior images reviewed side by side. Changes from prior study are noted. EF appears slightly reduced compared to prior. RV dilated, moderately reduced RV function, RVSP 62 mmHg. Conclusion(s)/Recommendation(s): No left ventricular mural or apical thrombus/thrombi. FINDINGS  Left Ventricle: Left ventricular ejection fraction, by  estimation, is 25 to 30%. The left ventricle has severely decreased function. The left ventricle demonstrates global  hypokinesis. Definity  contrast agent was given IV to delineate the left ventricular endocardial borders. The left ventricular internal cavity size was mildly to moderately dilated. There is no left ventricular hypertrophy. Left ventricular diastolic function could not be evaluated due to mitral annular calcification (moderate or greater). Left ventricular diastolic function could not be evaluated. Right Ventricle: The right ventricular size is severely enlarged. Right vetricular wall thickness was not well visualized. Right ventricular systolic function is moderately reduced. There is severely elevated pulmonary artery systolic pressure. The tricuspid regurgitant velocity is 3.44 m/s, and with an assumed right atrial pressure of 15 mmHg, the estimated right ventricular systolic pressure is 62.3 mmHg. Left Atrium: Left atrial size was moderately dilated. Right Atrium: Right atrial size was moderately dilated. Pericardium: There is no evidence of pericardial effusion. Mitral Valve: The mitral valve is degenerative in appearance. There is mild thickening of the mitral valve leaflet(s). There is mild calcification of the mitral valve leaflet(s). Moderate mitral annular calcification. Mild mitral valve regurgitation. Tricuspid Valve: The tricuspid valve is normal in structure. Tricuspid valve regurgitation is mild . No evidence of tricuspid stenosis. Aortic Valve: The aortic valve has been repaired/replaced. Aortic valve regurgitation is not visualized. Aortic valve mean gradient measures 5.6 mmHg. There is a mechanical valve present in the aortic position. Pulmonic Valve: The pulmonic valve was grossly normal. Pulmonic valve regurgitation is trivial. No evidence of pulmonic stenosis. Aorta: Aortic dilatation noted. There is moderate dilatation of the ascending aorta, measuring 45 mm. Venous: The inferior vena cava is dilated in size with less than 50% respiratory variability, suggesting right atrial pressure of 15 mmHg.  IAS/Shunts: The atrial septum is grossly normal.  LEFT VENTRICLE PLAX 2D LVIDd:         5.80 cm LVIDs:         5.60 cm LV PW:         1.00 cm LV IVS:        1.00 cm LVOT diam:     2.10 cm LVOT Area:     3.46 cm  LV Volumes (MOD) LV vol d, MOD A2C: 228.0 ml LV vol d, MOD A4C: 232.0 ml LV vol s, MOD A2C: 194.0 ml LV vol s, MOD A4C: 166.0 ml LV SV MOD A2C:     34.0 ml LV SV MOD A4C:     232.0 ml LV SV MOD BP:      35.8 ml RIGHT VENTRICLE         IVC TAPSE (M-mode): 0.9 cm  IVC diam: 2.80 cm LEFT ATRIUM             Index        RIGHT ATRIUM           Index LA diam:        5.10 cm 2.55 cm/m   RA Area:     24.50 cm LA Vol (A2C):   91.7 ml 45.79 ml/m  RA Volume:   77.20 ml  38.55 ml/m LA Vol (A4C):   49.1 ml 24.52 ml/m LA Biplane Vol: 71.6 ml 35.75 ml/m  AORTIC VALVE AV Mean Grad: 5.6 mmHg  AORTA Ao Root diam: 3.00 cm Ao Asc diam:  4.50 cm TRICUSPID VALVE TR Peak grad:   47.3 mmHg TR Vmax:        344.00 cm/s  SHUNTS Systemic Diam: 2.10 cm Sheryle Donning MD Electronically signed by Yvette Hercules  Christopher MD Signature Date/Time: 01/03/2024/8:33:21 PM    Final      Medications:     Scheduled Medications:  allopurinol   150 mg Oral Daily   aspirin  EC  81 mg Oral Daily   Chlorhexidine  Gluconate Cloth  6 each Topical Daily   dapagliflozin  propanediol  10 mg Oral Daily   fesoterodine   4 mg Oral QODAY   furosemide   80 mg Intravenous BID   mupirocin  ointment  1 Application Nasal BID   tamsulosin   0.4 mg Oral QODAY   Warfarin - Pharmacist Dosing Inpatient   Does not apply q1600    Infusions:  amiodarone  60 mg/hr (01/05/24 1014)   milrinone  0.2 mcg/kg/min (01/05/24 1000)   norepinephrine  (LEVOPHED ) Adult infusion Stopped (01/04/24 1207)    PRN Medications:    Assessment/Plan:   1. Acute on chronic systolic CHF -> cardiogenic shock:  - Coronary angiography in 1/23  mild CAD.  - Echo 6/24 showed EF 30-35%, RV mildly reduced.  - Echo 01/03/24  EF 25-30% RV mod reduced - Has developed  recurrent HF/shock in setting of recurrent AF - Initial LA 2.1. Co-ox 41%  - Now on milrinone  0.2 Co-ox 62% - Has diuresed well. Still mildly volume overloaded.CVP 11 - Will continue milrinone  at 0.2/ Diurese with lasix  80 IV bid today - Once fully diuresed will begin milrinone  wean  - Continue to hold entresto , toprol  XL, and spiro. - Continue Farxiga  10 mg daily.   2. Mechanical aortic valve: Continue warfarin and ASA 81. - INR 2.8. Discussed warfarin dosing with PharmD personally.   3. Paroxysmal Atrial fibrillation/atrial flutter:  - Admitted 1/18 with atrial fibrillation with RVR and CHF.  EF down to 20-25%.  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   - now with cardiogenic shock in setting of recurrent AF.  - On IV amio and now AF -> AFL - Failed DC-CV on 6/13 - Now back in NSR on amio. Continue drip at 60 today - Plan DC-CV toda  4. Ascending aortic aneurysm: 4.3 cm on 4/24 MRA chest.   5. AKI on CKD2: Creatinine 2.14 on admit. Baseline 1.5. Suspect cardio-renal.  - improved 2.01 -> 1.88 . Stable 1.9 today - continue to support CO and BP w/ NE - follow BMET   6. Hyperkalemia - rec'd lokelma . Improved with hemodynamic support - k was low yesterday supped. Now elevated again. Resume lasix  - follow   7. Shock liver - likely 2/2 low output/hepatic congestion - inotropes + diuretics per above - improving   8. Hyponatremia/hypokalemia - limit FW  9. ILD - CT reviewed with P/CCM   Length of Stay: 4  Jules Oar MD 01/05/2024, 10:47 AM  Advanced Heart Failure Team Pager [phone] (M-F; 7a - 4p)  Please contact [facility] for night-coverage after hours (4p -7a ) and weekends on [email or web address]

## 2024-01-06 DIAGNOSIS — I5023 Acute on chronic systolic (congestive) heart failure: Secondary | ICD-10-CM | POA: Diagnosis not present

## 2024-01-06 LAB — CBC
HCT: 42.3 % (ref 39.0–52.0)
Hemoglobin: 13.8 g/dL (ref 13.0–17.0)
MCH: 20.3 pg — ABNORMAL LOW (ref 26.0–34.0)
MCHC: 32.6 g/dL (ref 30.0–36.0)
MCV: 62.3 fL — ABNORMAL LOW (ref 80.0–100.0)
Platelets: 165 10*3/uL (ref 150–400)
RBC: 6.79 MIL/uL — ABNORMAL HIGH (ref 4.22–5.81)
RDW: 19.5 % — ABNORMAL HIGH (ref 11.5–15.5)
WBC: 14.3 10*3/uL — ABNORMAL HIGH (ref 4.0–10.5)
nRBC: 2.3 % — ABNORMAL HIGH (ref 0.0–0.2)

## 2024-01-06 LAB — BASIC METABOLIC PANEL WITH GFR
Anion gap: 15 (ref 5–15)
BUN: 71 mg/dL — ABNORMAL HIGH (ref 8–23)
CO2: 28 mmol/L (ref 22–32)
Calcium: 8.7 mg/dL — ABNORMAL LOW (ref 8.9–10.3)
Chloride: 81 mmol/L — ABNORMAL LOW (ref 98–111)
Creatinine, Ser: 1.92 mg/dL — ABNORMAL HIGH (ref 0.61–1.24)
GFR, Estimated: 34 mL/min — ABNORMAL LOW (ref >60)
Glucose, Bld: 170 mg/dL — ABNORMAL HIGH (ref 70–99)
Potassium: 4.3 mmol/L (ref 3.5–5.1)
Sodium: 124 mmol/L — ABNORMAL LOW (ref 135–145)

## 2024-01-06 LAB — PROTIME-INR
INR: 2.9 — ABNORMAL HIGH (ref 0.8–1.2)
Prothrombin Time: 30.6 s — ABNORMAL HIGH (ref 11.4–15.2)

## 2024-01-06 LAB — COOXEMETRY PANEL
Carboxyhemoglobin: 1.8 % — ABNORMAL HIGH (ref 0.5–1.5)
Methemoglobin: 0.9 % (ref 0.0–1.5)
O2 Saturation: 67.5 %
Total hemoglobin: 14.2 g/dL (ref 12.0–16.0)

## 2024-01-06 LAB — MAGNESIUM: Magnesium: 2.6 mg/dL — ABNORMAL HIGH (ref 1.7–2.4)

## 2024-01-06 MED ORDER — FUROSEMIDE 10 MG/ML IJ SOLN
40.0000 mg/h | INTRAVENOUS | Status: DC
  Administered 2024-01-06: 20 mg/h via INTRAVENOUS
  Administered 2024-01-07 (×2): 30 mg/h via INTRAVENOUS
  Administered 2024-01-07: 20 mg/h via INTRAVENOUS
  Administered 2024-01-08 (×3): 30 mg/h via INTRAVENOUS
  Administered 2024-01-08 – 2024-01-10 (×7): 40 mg/h via INTRAVENOUS
  Filled 2024-01-06 (×15): qty 20

## 2024-01-06 MED ORDER — WARFARIN SODIUM 2.5 MG PO TABS
2.5000 mg | ORAL_TABLET | Freq: Once | ORAL | Status: AC
  Administered 2024-01-06: 2.5 mg via ORAL
  Filled 2024-01-06: qty 1

## 2024-01-06 NOTE — Progress Notes (Signed)
 ANTICOAGULATION CONSULT NOTE  Pharmacy Consult for Warfarin Indication: afib with mechanical AVR  Allergies  Allergen Reactions   Celecoxib Rash    Other Reaction(s): Unknown    Patient Measurements: Height: 5' 10 (177.8 cm) Weight: 83.9 kg (184 lb 15.5 oz) IBW/kg (Calculated) : 73  Vital Signs: Temp: 97 F (36.1 C) (06/15 1100) Temp Source: Axillary (06/15 1100) BP: 81/69 (06/15 1400) Pulse Rate: 86 (06/15 1400)  Labs: Recent Labs    01/04/24 1918 01/05/24 0441 01/06/24 0302  HGB 14.5 14.0 13.8  HCT 44.9 43.1 42.3  PLT 159 164 165  LABPROT 32.6* 29.8* 30.6*  INR 3.2* 2.8* 2.9*  CREATININE 1.86* 1.92* 1.92*    Estimated Creatinine Clearance: 30.6 mL/min (A) (by C-G formula based on SCr of 1.92 mg/dL (H)).   Medical History: Past Medical History:  Diagnosis Date   Anticoagulant long-term use    Arthritis    probably in my thumbs (06/26/2012)   Bifascicular block    Chronic combined systolic and diastolic CHF (congestive heart failure) (HCC)    a. 01/2016 Echo: EF 45-50%, gr2 DD, inflat AK (poor acoustic windows);  b. 07/2016 Echo: EF 25-30%, mild LVH, PASP (pt in Afib).   Complication of anesthesia    wake up w/a start; hallucinations (06/26/2012)   Critical Aortic Stenosis    a. 03/2003 s/p SJM #25 mech prosthetic AoV;  b. 07/2016 Echo: EF 25-30% (in setting of AF), AoV area 1.72 cm^2 (VTI), 1.75 cm^2 (Vmean).   Diverticulosis    Gouty arthritis    have had it in both feet, ankles, right knee (06/26/2012)   History of diverticulitis of colon    History of kidney stones    History of pancreatitis    Hypertension    Incomplete RBBB    PAF (paroxysmal atrial fibrillation) (HCC)    a. CHA2DS2VASc = 4-->chronic coumadin  in setting of mech AoV;  b. 07/2016 Recurrent AF RVR.    Medications:  Medications Prior to Admission  Medication Sig Dispense Refill Last Dose/Taking   acetaminophen  (TYLENOL ) 500 MG tablet Take 500 mg by mouth 2 (two) times daily.    01/01/2024   albuterol  (VENTOLIN  HFA) 108 (90 Base) MCG/ACT inhaler Inhale 2 puffs into the lungs every 4 (four) hours.   01/01/2024   allopurinol  (ZYLOPRIM ) 300 MG tablet Take 150 mg by mouth daily.   01/01/2024   aspirin  EC 81 MG tablet Take 81 mg by mouth daily. Swallow whole.   01/01/2024   clobetasol cream (TEMOVATE) 0.05 % Apply 1 Application topically 2 (two) times daily.   01/01/2024   colchicine  0.6 MG tablet Take 1 tablet (0.6 mg total) by mouth daily as needed (for gout).   Unknown   FARXIGA  10 MG TABS tablet TAKE 1 TABLET BY MOUTH EVERY DAY 30 tablet 6 01/01/2024   FIBER PO Take 2 capsules by mouth 2 (two) times daily.   01/01/2024   folic acid  (FOLVITE ) 1 MG tablet TAKE 1 TABLET(1 MG) BY MOUTH DAILY 90 tablet 3 01/01/2024   metoprolol  succinate (TOPROL -XL) 25 MG 24 hr tablet TAKE 1 TABLET BY MOUTH EVERYDAY AT BEDTIME 90 tablet 1 12/31/2023   potassium chloride  SA (KLOR-CON  M) 20 MEQ tablet Take 1 tablet (20 mEq total) by mouth daily.   01/01/2024   tamsulosin  (FLOMAX ) 0.4 MG CAPS capsule Take 1 capsule (0.4 mg total) by mouth every other day. 90 capsule 3 01/01/2024   tolterodine  (DETROL  LA) 4 MG 24 hr capsule Take 1 capsule (4 mg  total) by mouth every other day. Alternating days with the tamsulosin  90 capsule 3 12/31/2023   torsemide  (DEMADEX ) 20 MG tablet Take 3 tablets (60 mg total) by mouth daily.   01/01/2024   warfarin (COUMADIN ) 5 MG tablet Take 0.5-1 tablets (2.5-5 mg total) by mouth See admin instructions. 1/19 and 1/20  take 7.5mg  (1 and 1/2 tab) then take 1 tab (5mg ) daily until seen by MD  Take 1 tablet (5 mg) by mouth on Sundays, Mondays, Wednesdays, Thursdays & Saturdays in the evening. Take 0.5 tablet (2.5 mg) by mouth on Tuesdays & Fridays in the evening. (Patient taking differently: Take 2.5-5 mg by mouth See admin instructions. AS directed by PCP 2.5 mg Daily) 45 tablet 11 12/31/2023 at  8:00 PM   docusate sodium  (COLACE) 100 MG capsule Take 100 mg by mouth 2 (two) times daily.  (Patient not taking: Reported on 01/01/2024)   Not Taking   polyethylene glycol powder (GLYCOLAX /MIRALAX ) 17 GM/SCOOP powder Take 17 g by mouth 2 (two) times daily as needed for moderate constipation or mild constipation. (Patient not taking: Reported on 12/26/2023) 238 g 2    Scheduled:   allopurinol   150 mg Oral Daily   aspirin  EC  81 mg Oral Daily   Chlorhexidine  Gluconate Cloth  6 each Topical Daily   dapagliflozin  propanediol  10 mg Oral Daily   fesoterodine   4 mg Oral QODAY   furosemide   80 mg Intravenous BID   mupirocin  ointment  1 Application Nasal BID   tamsulosin   0.4 mg Oral QODAY   Warfarin - Pharmacist Dosing Inpatient   Does not apply q1600   Infusions:   amiodarone  60 mg/hr (01/06/24 1400)   milrinone  0.2 mcg/kg/min (01/06/24 1422)   norepinephrine  (LEVOPHED ) Adult infusion Stopped (01/06/24 1026)   PRN: traZODone   Assessment: 83 yo male presenting with worsening shortness of breath. On warfarin PTA for afib with mechanical AVR.    Per patient, dose is 5 mg daily, poor historian. Upon chart review patient should be on 2.5 mg once daily on Monday, Tuesday, Thursday, Friday, and Sunday and 1 mg on Wednesday and Saturday per last PCP note on 12/18/2023. Goal INR is 2.5-3.5.   INR today is 2.9 which is therapeutic. CBC remains stable. Pertinent DDI's - amio IV @60  (none PTA, 6/12 > ), allopurinol  (PTA)  Goal of Therapy:  INR 2.5-3.5 Monitor platelets by anticoagulation protocol: Yes   Plan:  Warfarin 2.5 mg x 1 (continuation of PTA dose) Monitor for s/s of hemorrhage, daily INR, CBC Watch for new DDIs  Thank you for involving pharmacy in the patient's care.   Cecillia Cogan, PharmD Clinical Pharmacist 01/06/2024  2:40 PM

## 2024-01-06 NOTE — Progress Notes (Signed)
 Advanced Heart Failure Rounding Note   Subjective:    Moved to ICU on 6/11for cardiogenic shock.  LA 3.2 Co-ox 41% -> 32% -> 55% - 01/04/24 Failed DC-CV for AF/AFL   Remains on milrinone  0.2. Off NE. Co-ox 68% CVP 13-14. Minimal urine output with lasix  80 IV bid   On IV amio. Back in AFL today with controlled rate ( he looked to be in SR yesterday)  Feels ok. Denies CP or SOB  Objective:   Weight Range:  Vital Signs:   Temp:  [97 F (36.1 C)-97.5 F (36.4 C)] 97.3 F (36.3 C) (06/15 1500) Pulse Rate:  [78-107] 82 (06/15 1600) Resp:  [14-37] 16 (06/15 1600) BP: (74-146)/(47-103) 95/73 (06/15 1600) SpO2:  [90 %-96 %] 92 % (06/15 1600) Weight:  [83.9 kg] 83.9 kg (06/15 0500) Last BM Date : 01/05/24  Weight change: Filed Weights   01/04/24 0545 01/05/24 0449 01/06/24 0500  Weight: 79.4 kg 82.9 kg 83.9 kg    Intake/Output:   Intake/Output Summary (Last 24 hours) at 01/06/2024 1734 Last data filed at 01/06/2024 1709 Gross per 24 hour  Intake 1757.51 ml  Output 1225 ml  Net 532.51 ml     Physical Exam: General:  Elderly No resp difficulty HEENT: normal Neck: supple. JVP to ear .  Cor: IRREG Lungs: clear Abdomen: soft, nontender, mildly distended. No hepatosplenomegaly. No bruits or masses. Good bowel sounds. Extremities: no cyanosis, clubbing, rash, edema Neuro: alert & orientedx3, cranial nerves grossly intact. moves all 4 extremities w/o difficulty. Affect pleasant  Telemetry: SinusAFL 80s Personally reviewed  Labs: Basic Metabolic Panel: Recent Labs  Lab 01/03/24 0449 01/04/24 0510 01/04/24 1008 01/04/24 1255 01/04/24 1918 01/05/24 0441 01/06/24 0302  NA 126* 127*  --  131* 128* 125* 124*  K 4.4 2.6* 3.2* 3.3* 4.9 5.3* 4.3  CL 86* 82*  --  84* 84* 83* 81*  CO2 25 31  --  29 29 26 28   GLUCOSE 139* 125*  --  136* 173* 173* 170*  BUN 81* 74*  --  70* 71* 73* 71*  CREATININE 2.01* 1.88*  --  1.78* 1.86* 1.92* 1.92*  CALCIUM 8.8* 8.3*  --  8.6* 8.9  8.8* 8.7*  MG 2.6* 2.6*  --   --  2.5* 2.5* 2.6*    Liver Function Tests: Recent Labs  Lab 01/01/24 1124 01/03/24 0449  AST 90* 110*  ALT 73* 124*  ALKPHOS 145* 158*  BILITOT 3.4* 3.2*  PROT 6.7 6.4*  ALBUMIN 3.7 3.4*   No results for input(s): LIPASE, AMYLASE in the last 168 hours. No results for input(s): AMMONIA in the last 168 hours.  CBC: Recent Labs  Lab 01/01/24 1124 01/02/24 0426 01/03/24 0449 01/04/24 0510 01/04/24 1918 01/05/24 0441 01/06/24 0302  WBC 15.4*   < > 18.2* 12.5* 14.3* 15.1* 14.3*  NEUTROABS 13.3*  --   --   --   --   --   --   HGB 15.0   < > 14.9 13.6 14.5 14.0 13.8  HCT 48.1   < > 45.9 42.0 44.9 43.1 42.3  MCV 64.7*   < > 62.2* 61.9* 62.9* 62.6* 62.3*  PLT 162   < > 180 168 159 164 165   < > = values in this interval not displayed.    Cardiac Enzymes: No results for input(s): CKTOTAL, CKMB, CKMBINDEX, TROPONINI in the last 168 hours.  BNP: BNP (last 3 results) Recent Labs    12/10/23 1619 12/25/23  0935 01/01/24 1124  BNP 3,213.0* 2,855.5* 3,066.5*    ProBNP (last 3 results) No results for input(s): PROBNP in the last 8760 hours.    Other results:  Imaging: No results found.    Medications:     Scheduled Medications:  allopurinol   150 mg Oral Daily   aspirin  EC  81 mg Oral Daily   Chlorhexidine  Gluconate Cloth  6 each Topical Daily   dapagliflozin  propanediol  10 mg Oral Daily   fesoterodine   4 mg Oral QODAY   mupirocin  ointment  1 Application Nasal BID   tamsulosin   0.4 mg Oral QODAY   Warfarin - Pharmacist Dosing Inpatient   Does not apply q1600    Infusions:  amiodarone  60 mg/hr (01/06/24 1706)   furosemide  (LASIX ) 200 mg in dextrose  5 % 100 mL (2 mg/mL) infusion     milrinone  0.2 mcg/kg/min (01/06/24 1600)   norepinephrine  (LEVOPHED ) Adult infusion Stopped (01/06/24 1026)    PRN Medications: traZODone    Assessment/Plan:   1. 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  - Echo 6/24 showed EF 30-35%, RV mildly reduced.  - Echo 01/03/24  EF 25-30% RV mod reduced - Has developed recurrent HF/shock in setting of recurrent AF - Initial LA 2.1. Co-ox 41%  - Now on milrinone  0.2 Co-ox 68% - Remains volume overloaded but not diuresing well with lasix  80 IV bid. Switch back to lasix  gtt at 20 (responded well to this earlier in admit) - Once fully diuresed will begin milrinone  wean  - Continue to hold entresto , toprol  XL, and spiro. - Continue Farxiga  10 mg daily.   2. Mechanical aortic valve: Continue warfarin and ASA 81. - INR 2.9. Discussed warfarin dosing with PharmD personally. 3. Paroxysmal Atrial fibrillation/atrial flutter:  - Admitted 1/18 with atrial fibrillation with RVR and CHF.  EF down to 20-25%.  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   - now with cardiogenic shock in setting of recurrent AF.  - On IV amio and now AF -> AFL - Failed DC-CV on 6/13 - ? Back in NSR on 6/14  - In AFL today with CVR. Continue IV amio. Repeat DC-CV as needed  4. Ascending aortic aneurysm: 4.3 cm on 4/24 MRA chest.   5. AKI on CKD2: Creatinine 2.14 on admit. Baseline 1.5. Suspect cardio-renal.  - improved 2.01 -> 1.88 . Stable 1.9 today - continue to support with milrinone . Use NE to keep MAP > 65 - follow BMET   6. Hyperkalemia - rec'd lokelma . Improved with hemodynamic support - daily BMET   7. Shock liver - likely 2/2 low output/hepatic congestion - inotropes + diuretics per above - improving   8. Hyponatremia/hypokalemia - limit FW  9. ILD - CT reviewed with P/CCM   Length of Stay: 5  Jules Oar MD 01/06/2024, 5:34 PM  Advanced Heart Failure Team Pager [phone] (M-F; 7a - 4p)  Please contact [facility] for night-coverage after hours (4p -7a ) and weekends on [email or web address]

## 2024-01-06 NOTE — Plan of Care (Signed)
  Problem: Clinical Measurements: Goal: Respiratory complications will improve Outcome: Progressing   Problem: Activity: Goal: Risk for activity intolerance will decrease Outcome: Progressing   Problem: Nutrition: Goal: Adequate nutrition will be maintained Outcome: Progressing   Problem: Elimination: Goal: Will not experience complications related to bowel motility Outcome: Progressing Goal: Will not experience complications related to urinary retention Outcome: Progressing   Problem: Safety: Goal: Ability to remain free from injury will improve Outcome: Progressing   Problem: Skin Integrity: Goal: Risk for impaired skin integrity will decrease Outcome: Progressing   

## 2024-01-07 ENCOUNTER — Encounter (HOSPITAL_COMMUNITY)

## 2024-01-07 DIAGNOSIS — R570 Cardiogenic shock: Secondary | ICD-10-CM | POA: Diagnosis not present

## 2024-01-07 LAB — CBC
HCT: 40.1 % (ref 39.0–52.0)
Hemoglobin: 12.9 g/dL — ABNORMAL LOW (ref 13.0–17.0)
MCH: 20 pg — ABNORMAL LOW (ref 26.0–34.0)
MCHC: 32.2 g/dL (ref 30.0–36.0)
MCV: 62.2 fL — ABNORMAL LOW (ref 80.0–100.0)
Platelets: 131 10*3/uL — ABNORMAL LOW (ref 150–400)
RBC: 6.45 MIL/uL — ABNORMAL HIGH (ref 4.22–5.81)
RDW: 19.3 % — ABNORMAL HIGH (ref 11.5–15.5)
WBC: 11.1 10*3/uL — ABNORMAL HIGH (ref 4.0–10.5)
nRBC: 0.7 % — ABNORMAL HIGH (ref 0.0–0.2)

## 2024-01-07 LAB — SODIUM, URINE, RANDOM
Sodium, Ur: <10 mmol/L
Sodium, Ur: <10 mmol/L

## 2024-01-07 LAB — BASIC METABOLIC PANEL WITH GFR
Anion gap: 12 (ref 5–15)
Anion gap: 16 — ABNORMAL HIGH (ref 5–15)
Anion gap: 12 (ref 5–15)
BUN: 67 mg/dL — ABNORMAL HIGH (ref 8–23)
BUN: 65 mg/dL — ABNORMAL HIGH (ref 8–23)
BUN: 60 mg/dL — ABNORMAL HIGH (ref 8–23)
CO2: 28 mmol/L (ref 22–32)
CO2: 28 mmol/L (ref 22–32)
CO2: 30 mmol/L (ref 22–32)
Calcium: 8.4 mg/dL — ABNORMAL LOW (ref 8.9–10.3)
Calcium: 8.8 mg/dL — ABNORMAL LOW (ref 8.9–10.3)
Calcium: 8.5 mg/dL — ABNORMAL LOW (ref 8.9–10.3)
Chloride: 82 mmol/L — ABNORMAL LOW (ref 98–111)
Chloride: 80 mmol/L — ABNORMAL LOW (ref 98–111)
Chloride: 80 mmol/L — ABNORMAL LOW (ref 98–111)
Creatinine, Ser: 1.84 mg/dL — ABNORMAL HIGH (ref 0.61–1.24)
Creatinine, Ser: 1.9 mg/dL — ABNORMAL HIGH (ref 0.61–1.24)
Creatinine, Ser: 1.83 mg/dL — ABNORMAL HIGH (ref 0.61–1.24)
GFR, Estimated: 36 mL/min — ABNORMAL LOW (ref >60)
GFR, Estimated: 35 mL/min — ABNORMAL LOW (ref >60)
GFR, Estimated: 36 mL/min — ABNORMAL LOW (ref >60)
Glucose, Bld: 131 mg/dL — ABNORMAL HIGH (ref 70–99)
Glucose, Bld: 180 mg/dL — ABNORMAL HIGH (ref 70–99)
Glucose, Bld: 268 mg/dL — ABNORMAL HIGH (ref 70–99)
Potassium: 3.6 mmol/L (ref 3.5–5.1)
Potassium: 4.2 mmol/L (ref 3.5–5.1)
Potassium: 3.8 mmol/L (ref 3.5–5.1)
Sodium: 122 mmol/L — ABNORMAL LOW (ref 135–145)
Sodium: 124 mmol/L — ABNORMAL LOW (ref 135–145)
Sodium: 122 mmol/L — ABNORMAL LOW (ref 135–145)

## 2024-01-07 LAB — COOXEMETRY PANEL
Carboxyhemoglobin: 1.5 % (ref 0.5–1.5)
Methemoglobin: <0.7 % (ref 0.0–1.5)
O2 Saturation: 64.7 %
Total hemoglobin: 13.4 g/dL (ref 12.0–16.0)

## 2024-01-07 LAB — MAGNESIUM
Magnesium: 2.5 mg/dL — ABNORMAL HIGH (ref 1.7–2.4)
Magnesium: 2.5 mg/dL — ABNORMAL HIGH (ref 1.7–2.4)

## 2024-01-07 LAB — PROTIME-INR
INR: 2.4 — ABNORMAL HIGH (ref 0.8–1.2)
Prothrombin Time: 26.2 s — ABNORMAL HIGH (ref 11.4–15.2)

## 2024-01-07 MED ORDER — NOREPINEPHRINE 16 MG/250ML-% IV SOLN
3.0000 ug/min | INTRAVENOUS | Status: DC
  Administered 2024-01-07: 3 ug/min via INTRAVENOUS

## 2024-01-07 MED ORDER — WARFARIN SODIUM 3 MG PO TABS
3.0000 mg | ORAL_TABLET | Freq: Once | ORAL | Status: AC
  Administered 2024-01-07: 3 mg via ORAL
  Filled 2024-01-07: qty 1

## 2024-01-07 MED ORDER — ORAL CARE MOUTH RINSE: 15.0000 mL | OROMUCOSAL | Status: DC | PRN

## 2024-01-07 MED ORDER — SODIUM CHLORIDE 3 % IV BOLUS
150.0000 mL | Freq: Once | INTRAVENOUS | Status: AC
  Administered 2024-01-07: 150 mL via INTRAVENOUS
  Filled 2024-01-07: qty 500

## 2024-01-07 MED ORDER — FUROSEMIDE 10 MG/ML IJ SOLN
120.0000 mg | Freq: Once | INTRAVENOUS | Status: AC
  Administered 2024-01-07: 120 mg via INTRAVENOUS
  Filled 2024-01-07: qty 10

## 2024-01-07 MED ORDER — POTASSIUM CHLORIDE CRYS ER 20 MEQ PO TBCR
40.0000 meq | EXTENDED_RELEASE_TABLET | Freq: Two times a day (BID) | ORAL | Status: AC
  Administered 2024-01-07 (×2): 40 meq via ORAL
  Filled 2024-01-07 (×2): qty 2

## 2024-01-07 NOTE — TOC Initial Note (Signed)
 Transition of Care [facility]) - Initial/Assessment Note    Patient Details  Name: Adrian Lambert MRN: [id number] Date of Birth: 1940-08-01  Transition of Care [facility]) CM/SW Contact:    Benjiman Bras, RN Phone Number: [phone] 01/07/2024, 5:52 PM  Clinical Narrative:                 TOC CM spoke to pt at bedside. Was independent pta. Wife will assist with care at home. Offered choice for [facility], pt agreeable to [facility]. Gave permission to speak to wife for [facility] choice.   Expected Discharge Plan: Home w Home Health Services Barriers to Discharge: Continued Medical Work up   Patient Goals and CMS Choice Patient states their goals for this hospitalization and ongoing recovery are:: wants to remain independent CMS Medicare.gov Compare Post Acute Care list provided to:: Patient Choice offered to / list presented to : Patient      Expected Discharge Plan and Services   Discharge Planning Services: CM Consult Post Acute Care Choice: Home Health Living arrangements for the past 2 months: Single Family Home                                      Prior Living Arrangements/Services Living arrangements for the past 2 months: Single Family Home Lives with:: Spouse Patient language and need for interpreter reviewed:: Yes Do you feel safe going back to the place where you live?: Yes      Need for Family Participation in Patient Care: Yes (Comment) Care giver support system in place?: Yes (comment) Current home services: DME (rolling walker, cane) Criminal Activity/Legal Involvement Pertinent to Current Situation/Hospitalization: No - Comment as needed  Activities of Daily Living   ADL Screening (condition at time of admission) Independently performs ADLs?: Yes (appropriate for developmental age) Is the patient deaf or have difficulty hearing?: No Does the patient have difficulty seeing, even when wearing glasses/contacts?: No Does the patient have difficulty concentrating,  remembering, or making decisions?: No  Permission Sought/Granted Permission sought to share information with : Case Manager, Family Supports, PCP Permission granted to share information with : Yes, Verbal Permission Granted  Share Information with NAME: Sameul Tagle  Permission granted to share info w AGENCY: Home Health, DME, PCP  Permission granted to share info w Relationship: wife  Permission granted to share info w Contact Information: [phone]  Emotional Assessment Appearance:: Appears stated age Attitude/Demeanor/Rapport: Engaged Affect (typically observed): Accepting Orientation: : Oriented to Self, Oriented to Place, Oriented to  Time, Oriented to Situation   Psych Involvement: No (comment)  Admission diagnosis:  Acute CHF (congestive heart failure) (HCC) [I50.9] Acute systolic congestive heart failure (HCC) [I50.21] Acute on chronic heart failure (HCC) [I50.9] Patient Active Problem List   Diagnosis Date Noted   Cardiogenic shock (HCC) 01/07/2024   Atrial flutter (HCC) 01/04/2024   Pressure injury of skin 01/04/2024   Acute on chronic heart failure (HCC) 01/01/2024   Acute CHF (congestive heart failure) (HCC) 01/01/2024   Sepsis (HCC) 10/17/2022   BPH (benign prostatic hyperplasia) 10/17/2022   Multifocal pneumonia 10/16/2022   Chronic systolic heart failure (HCC) 01/02/2022   AF (atrial fibrillation) (HCC) 11/28/2016   Atrial fibrillation (HCC) 08/16/2016   Acute on chronic combined systolic and diastolic CHF, NYHA class 3 (HCC) 08/15/2016   Prostate cancer (HCC) 11/26/2012   Cellulitis 06/26/2012   S/P AVR (aortic valve replacement) 06/28/2011  History of aortic stenosis 06/28/2011   Bifascicular block 06/28/2011   Essential hypertension 11/04/2007   RHINITIS 11/04/2007   GERD 11/04/2007   COUGH 11/04/2007   PCP:  Jimmey Mould, MD Pharmacy:   [id number] - [place], [place] - [address] AT [address]OSS [address] [place] [address] Phone: [phone] Fax: [phone]  [id number] - [place], [place] - [address] AT [facility] [address] [place] [address] Phone: [phone] Fax: [phone]  [facility] [address] [place] [address] Phone: [phone] Fax: [phone]     Social Drivers of Health (SDOH) Social History: SDOH Screenings   Food Insecurity: No Food Insecurity (01/02/2024)  Housing: Low Risk  (01/02/2024)  Transportation Needs: No Transportation Needs (01/02/2024)  Utilities: Not At Risk (01/02/2024)  Social Connections: Moderately Isolated (01/02/2024)  Tobacco Use: Low Risk  (01/02/2024)   SDOH Interventions:     Readmission Risk Interventions     No data to display

## 2024-01-07 NOTE — Progress Notes (Signed)
 Physical Therapy Treatment Patient Details Name: Adrian Lambert MRN: [id number] DOB: 09/18/1940 Today's Date: 01/07/2024   History of Present Illness Pt is 83 yo presenting to [facility] ED on 6/10 due to shortness of breath Developed cardiogenic shock, progressive hypotension and signs of hyperperfusion. Admitted to ICU for ionotropes. PMH: arthritis, systolic and diastolic CHF, diverticulosis, HTN, incomplete RBBB, PAF    PT Comments  Patient up in chair on arrival. Per RN, has been up almost all morning. Educated in seated exercises with good tolerance. Assisted chair to bed with mod assist for sit to stand and min assist for pivotal steps around to bed. Return to sidelying>supine with min assist to raise legs onto bed. Educated in supine exercises with good tolerance. Attempted to turn on lights, open blinds to reduce risk of delirium with pt refusing. Will require +2 assist for ambulation/chair follow.    If plan is discharge home, recommend the following: Help with stairs or ramp for entrance;Assist for transportation;Assistance with cooking/housework;Two people to help with walking and/or transfers   Can travel by private vehicle        Equipment Recommendations  None recommended by PT    Recommendations for Other Services       Precautions / Restrictions Precautions Precautions: Fall Recall of Precautions/Restrictions: Intact Precaution/Restrictions Comments: watch O2 sats, HR Restrictions Weight Bearing Restrictions Per Provider Order: No     Mobility  Bed Mobility Overal bed mobility: Needs Assistance Bed Mobility: Sit to Sidelying, Rolling Rolling: Contact guard assist       Sit to sidelying: Min assist General bed mobility comments: vc for sequencing; assist to raise legs onto bed    Transfers Overall transfer level: Needs assistance Equipment used: None Transfers: Sit to/from Stand, Bed to chair/wheelchair/BSC Sit to Stand: Mod assist   Step pivot transfers:  Min assist       General transfer comment: face to face with pt holding onto PT's UEs; once fully standing, pt able to take steps from chair to bed    Ambulation/Gait               General Gait Details: will need +2   Stairs             Wheelchair Mobility     Tilt Bed    Modified Rankin (Stroke Patients Only)       Balance Overall balance assessment: Needs assistance Sitting-balance support: No upper extremity supported, Feet supported Sitting balance-Leahy Scale: Fair     Standing balance support: No upper extremity supported, Single extremity supported, During functional activity Standing balance-Leahy Scale: Poor Standing balance comment: pt seeking stability with UEs, tentative in standing, requires increased time to stand fully upright                            Communication Communication Communication: Impaired Factors Affecting Communication: Reduced clarity of speech  Cognition Arousal: Alert Behavior During Therapy: WFL for tasks assessed/performed   PT - Cognitive impairments: No apparent impairments                         Following commands: Intact      Cueing Cueing Techniques: Verbal cues  Exercises General Exercises - Lower Extremity Ankle Circles/Pumps: AROM, Both, 10 reps Quad Sets: AROM, Both, 10 reps Long Arc Quad: AROM, Both, 10 reps Heel Slides: AROM, Both, 10 reps, Supine Hip ABduction/ADduction: AROM, Both, 10 reps, Seated  General Comments        Pertinent Vitals/Pain Pain Assessment Pain Assessment: No/denies pain    Home Living                          Prior Function            PT Goals (current goals can now be found in the care plan section) Acute Rehab PT Goals Patient Stated Goal: to return home soon PT Goal Formulation: With patient Time For Goal Achievement: 01/16/24 Potential to Achieve Goals: Good Progress towards PT goals: Not progressing toward goals -  comment (medical decline w/ functional decline)    Frequency    Min 2X/week      PT Plan      Co-evaluation              AM-PAC PT 6 Clicks Mobility   Outcome Measure  Help needed turning from your back to your side while in a flat bed without using bedrails?: A Little Help needed moving from lying on your back to sitting on the side of a flat bed without using bedrails?: A Little Help needed moving to and from a bed to a chair (including a wheelchair)?: A Lot Help needed standing up from a chair using your arms (e.g., wheelchair or bedside chair)?: A Lot Help needed to walk in hospital room?: Total Help needed climbing 3-5 steps with a railing? : Total 6 Click Score: 12    End of Session Equipment Utilized During Treatment: Gait belt Activity Tolerance: Patient limited by fatigue Patient left: with call bell/phone within reach;in bed;with bed alarm set Nurse Communication: Mobility status PT Visit Diagnosis: Unsteadiness on feet (R26.81);Muscle weakness (generalized) (M62.81)     Time: 4098-1191 PT Time Calculation (min) (ACUTE ONLY): 25 min  Charges:    $Gait Training: 8-22 mins $Therapeutic Exercise: 8-22 mins PT General Charges $$ ACUTE PT VISIT: 1 Visit                      Adrian Lambert, PT Acute Rehabilitation Services  Office [phone]    Adrian Lambert 01/07/2024, 1:30 PM

## 2024-01-07 NOTE — Progress Notes (Signed)
 Advanced Heart Failure Rounding Note   Subjective:    Moved to ICU on 6/11 for cardiogenic shock.    Remains on milrinone  0.2, BP borderline this morning, will start flat dose NE for renal perfusion pressure augmentation. Suboptimal urine output and decreasing sodium suggesting inadequate natruresis, will increase lasix  gtt after bolus.   Chloride low indicating some element of diuretic resistance, low threshold for 3% HTS if worsening renal function this afternoon.   Remain in afib with controlled rate. Will need DCCV after diuresis.   Objective:   Weight Range:  Vital Signs:   Temp:  [97 F (36.1 C)-97.9 F (36.6 C)] 97.5 F (36.4 C) (06/16 0700) Pulse Rate:  [73-93] 80 (06/16 1000) Resp:  [13-37] 23 (06/16 1000) BP: (63-116)/(35-96) 75/64 (06/16 1000) SpO2:  [88 %-99 %] 96 % (06/16 1000) Weight:  [85.8 kg] 85.8 kg (06/16 0408) Last BM Date : 01/05/24  Weight change: Filed Weights   01/05/24 0449 01/06/24 0500 01/07/24 0408  Weight: 82.9 kg 83.9 kg 85.8 kg    Intake/Output:   Intake/Output Summary (Last 24 hours) at 01/07/2024 1031 Last data filed at 01/07/2024 1000 Gross per 24 hour  Intake 2007.16 ml  Output 1450 ml  Net 557.16 ml     Physical Exam: GENERAL: NAD, chronically ill appearing PULM:  Mildly increased work of breathing CARDIAC:  JVP: moderately elevated sitting up in chair with positive HJR         Controllled rate with irregular rhythm, systolic murmur   2+ LE edema. Warm and well perfused extremities. ABDOMEN: Soft, non-tender, non-distended. NEUROLOGIC: Patient is oriented x3 with no focal or lateralizing neurologic deficits.    Telemetry: Afib 80s  Labs: Basic Metabolic Panel: Recent Labs  Lab 01/04/24 0510 01/04/24 1008 01/04/24 1255 01/04/24 1918 01/05/24 0441 01/06/24 0302 01/07/24 0348  NA 127*  --  131* 128* 125* 124* 122*  K 2.6*   < > 3.3* 4.9 5.3* 4.3 3.6  CL 82*  --  84* 84* 83* 81* 82*  CO2 31  --  29 29 26 28 28    GLUCOSE 125*  --  136* 173* 173* 170* 131*  BUN 74*  --  70* 71* 73* 71* 67*  CREATININE 1.88*  --  1.78* 1.86* 1.92* 1.92* 1.84*  CALCIUM 8.3*  --  8.6* 8.9 8.8* 8.7* 8.4*  MG 2.6*  --   --  2.5* 2.5* 2.6* 2.5*   < > = values in this interval not displayed.    Liver Function Tests: Recent Labs  Lab 01/01/24 1124 01/03/24 0449  AST 90* 110*  ALT 73* 124*  ALKPHOS 145* 158*  BILITOT 3.4* 3.2*  PROT 6.7 6.4*  ALBUMIN 3.7 3.4*   No results for input(s): LIPASE, AMYLASE in the last 168 hours. No results for input(s): AMMONIA in the last 168 hours.  CBC: Recent Labs  Lab 01/01/24 1124 01/02/24 0426 01/04/24 0510 01/04/24 1918 01/05/24 0441 01/06/24 0302 01/07/24 0348  WBC 15.4*   < > 12.5* 14.3* 15.1* 14.3* 11.1*  NEUTROABS 13.3*  --   --   --   --   --   --   HGB 15.0   < > 13.6 14.5 14.0 13.8 12.9*  HCT 48.1   < > 42.0 44.9 43.1 42.3 40.1  MCV 64.7*   < > 61.9* 62.9* 62.6* 62.3* 62.2*  PLT 162   < > 168 159 164 165 131*   < > = values in this interval  not displayed.     Medications:     Scheduled Medications:  allopurinol   150 mg Oral Daily   aspirin  EC  81 mg Oral Daily   Chlorhexidine  Gluconate Cloth  6 each Topical Daily   dapagliflozin  propanediol  10 mg Oral Daily   fesoterodine   4 mg Oral QODAY   potassium chloride   40 mEq Oral BID   tamsulosin   0.4 mg Oral QODAY   Warfarin - Pharmacist Dosing Inpatient   Does not apply q1600    Infusions:  amiodarone  60 mg/hr (01/07/24 1000)   furosemide      furosemide  (LASIX ) 200 mg in dextrose  5 % 100 mL (2 mg/mL) infusion 30 mg/hr (01/07/24 1027)   milrinone  0.25 mcg/kg/min (01/07/24 1029)   norepinephrine  (LEVOPHED ) Adult infusion 3 mcg/min (01/07/24 1028)    PRN Medications: mouth rinse, traZODone    Assessment/Plan:   1. Acute on chronic systolic CHF -> cardiogenic shock:  - Coronary angiography in 1/23  mild CAD.  - Echo 01/03/24  EF 25-30% RV mod reduced - Has developed recurrent HF/shock in  setting of recurrent AF - Initial LA 2.1. Co-ox 41%  - Now on milrinone  0.2 Co-ox 68% - Poor urine response to drip, increase lasix  to 30/hour with 120mg  IV bolus - Repeat labs this PM, consideration of hypertonic saline - Flat dose NE at 3mcg/min for renal perfusion, continue milrinone  0.25mcg/kg/min for now - Continue to hold entresto , toprol  XL, and spiro. - Continue Farxiga  10 mg daily.   2. Mechanical aortic valve: Continue warfarin and ASA 81. - INR 2.4, discussed with pharmacy and will increase dosing  3. Paroxysmal Atrial fibrillation/atrial flutter:  - Admitted 1/18 with atrial fibrillation with RVR and CHF.  EF down to 20-25%.  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   - now with cardiogenic shock in setting of recurrent AF.  - On IV amio - Failed DC-CV on 6/13 - Will need DCCV prior to discharge after amio load and unloading  4. Ascending aortic aneurysm: 4.3 cm on 4/24 MRA chest.   5. AKI on CKD2: Creatinine 2.14 on admit. Baseline 1.5. Suspect cardio-renal.  - stable at 1.8 today, BUN improving somewhat - Increase as above - continue to support with milrinone  and flat dose NE - follow BMET   6. Hyperkalemia - rec'd lokelma . Improved with hemodynamic support   7. Shock liver - likely 2/2 low output/hepatic congestion - improving   8. Hyponatremia/hypokalemia - limit FW, diuresis  9. ILD - CT reviewed with P/CCM  CRITICAL CARE Performed by: Lauralee Poll   Total critical care time: 35 minutes  Critical care time was exclusive of separately billable procedures and treating other patients.  Critical care was necessary to treat or prevent imminent or life-threatening deterioration.  Critical care was time spent personally by me on the following activities: development of treatment plan with patient and/or surrogate as well as nursing, discussions with consultants, evaluation of patient's response to treatment, examination of  patient, obtaining history from patient or surrogate, ordering and performing treatments and interventions, ordering and review of laboratory studies, ordering and review of radiographic studies, pulse oximetry and re-evaluation of patient's condition. Addition of second vasopressor agent given worsening urine output, hopeful avoidance of CRRT.    Length of Stay: 6  Lauralee Poll MD 01/07/2024, 10:31 AM  Advanced Heart Failure Team Pager [phone] (M-F; 7a - 4p)  Please contact [facility] for night-coverage after hours (4p -7a ) and weekends on [email or web address]

## 2024-01-07 NOTE — Progress Notes (Signed)
 Advanced Heart Failure Progress Update  Minimal UOP with IV Lasix  bolus + increase in gtt to 30/hr. Cr stable 1.84>1.9. Na 124. Cl 80.  - Check urine Na now - Give 3% NaCl 150 mg IV over 1 hr - Check urine Na 2 hour after infusion - BMET this evening  Adrian Ardra Kuznicki, NP 01/07/24, 3:53 PM  Advanced Heart Failure Team Pager [phone] (M-F; 7a - 5p); After hours see Amion call coverage.

## 2024-01-07 NOTE — Progress Notes (Signed)
 ANTICOAGULATION CONSULT NOTE  Pharmacy Consult for Warfarin Indication: afib with mechanical AVR  Allergies  Allergen Reactions   Celecoxib Rash    Other Reaction(s): Unknown    Patient Measurements: Height: 5' 10 (177.8 cm) Weight: 85.8 kg (189 lb 2.5 oz) IBW/kg (Calculated) : 73  Vital Signs: Temp: 97.5 F (36.4 C) (06/16 0700) Temp Source: Axillary (06/16 0700) BP: 75/64 (06/16 1000) Pulse Rate: 80 (06/16 1000)  Labs: Recent Labs    01/05/24 0441 01/06/24 0302 01/07/24 0348  HGB 14.0 13.8 12.9*  HCT 43.1 42.3 40.1  PLT 164 165 131*  LABPROT 29.8* 30.6* 26.2*  INR 2.8* 2.9* 2.4*  CREATININE 1.92* 1.92* 1.84*    Estimated Creatinine Clearance: 32 mL/min (A) (by C-G formula based on SCr of 1.84 mg/dL (H)).   Medical History: Past Medical History:  Diagnosis Date   Anticoagulant long-term use    Arthritis    probably in my thumbs (06/26/2012)   Bifascicular block    Chronic combined systolic and diastolic CHF (congestive heart failure) (HCC)    a. 01/2016 Echo: EF 45-50%, gr2 DD, inflat AK (poor acoustic windows);  b. 07/2016 Echo: EF 25-30%, mild LVH, PASP (pt in Afib).   Complication of anesthesia    wake up w/a start; hallucinations (06/26/2012)   Critical Aortic Stenosis    a. 03/2003 s/p SJM #25 mech prosthetic AoV;  b. 07/2016 Echo: EF 25-30% (in setting of AF), AoV area 1.72 cm^2 (VTI), 1.75 cm^2 (Vmean).   Diverticulosis    Gouty arthritis    have had it in both feet, ankles, right knee (06/26/2012)   History of diverticulitis of colon    History of kidney stones    History of pancreatitis    Hypertension    Incomplete RBBB    PAF (paroxysmal atrial fibrillation) (HCC)    a. CHA2DS2VASc = 4-->chronic coumadin  in setting of mech AoV;  b. 07/2016 Recurrent AF RVR.    Medications:  Medications Prior to Admission  Medication Sig Dispense Refill Last Dose/Taking   acetaminophen  (TYLENOL ) 500 MG tablet Take 500 mg by mouth 2 (two) times  daily.   01/01/2024   albuterol  (VENTOLIN  HFA) 108 (90 Base) MCG/ACT inhaler Inhale 2 puffs into the lungs every 4 (four) hours.   01/01/2024   allopurinol  (ZYLOPRIM ) 300 MG tablet Take 150 mg by mouth daily.   01/01/2024   aspirin  EC 81 MG tablet Take 81 mg by mouth daily. Swallow whole.   01/01/2024   clobetasol cream (TEMOVATE) 0.05 % Apply 1 Application topically 2 (two) times daily.   01/01/2024   colchicine  0.6 MG tablet Take 1 tablet (0.6 mg total) by mouth daily as needed (for gout).   Unknown   FARXIGA  10 MG TABS tablet TAKE 1 TABLET BY MOUTH EVERY DAY 30 tablet 6 01/01/2024   FIBER PO Take 2 capsules by mouth 2 (two) times daily.   01/01/2024   folic acid  (FOLVITE ) 1 MG tablet TAKE 1 TABLET(1 MG) BY MOUTH DAILY 90 tablet 3 01/01/2024   metoprolol  succinate (TOPROL -XL) 25 MG 24 hr tablet TAKE 1 TABLET BY MOUTH EVERYDAY AT BEDTIME 90 tablet 1 12/31/2023   potassium chloride  SA (KLOR-CON  M) 20 MEQ tablet Take 1 tablet (20 mEq total) by mouth daily.   01/01/2024   tamsulosin  (FLOMAX ) 0.4 MG CAPS capsule Take 1 capsule (0.4 mg total) by mouth every other day. 90 capsule 3 01/01/2024   tolterodine  (DETROL  LA) 4 MG 24 hr capsule Take 1 capsule (4 mg  total) by mouth every other day. Alternating days with the tamsulosin  90 capsule 3 12/31/2023   torsemide  (DEMADEX ) 20 MG tablet Take 3 tablets (60 mg total) by mouth daily.   01/01/2024   warfarin (COUMADIN ) 5 MG tablet Take 0.5-1 tablets (2.5-5 mg total) by mouth See admin instructions. 1/19 and 1/20  take 7.5mg  (1 and 1/2 tab) then take 1 tab (5mg ) daily until seen by MD  Take 1 tablet (5 mg) by mouth on Sundays, Mondays, Wednesdays, Thursdays & Saturdays in the evening. Take 0.5 tablet (2.5 mg) by mouth on Tuesdays & Fridays in the evening. (Patient taking differently: Take 2.5-5 mg by mouth See admin instructions. AS directed by PCP 2.5 mg Daily) 45 tablet 11 12/31/2023 at  8:00 PM   docusate sodium  (COLACE) 100 MG capsule Take 100 mg by mouth 2 (two) times  daily. (Patient not taking: Reported on 01/01/2024)   Not Taking   polyethylene glycol powder (GLYCOLAX /MIRALAX ) 17 GM/SCOOP powder Take 17 g by mouth 2 (two) times daily as needed for moderate constipation or mild constipation. (Patient not taking: Reported on 12/26/2023) 238 g 2    Scheduled:   allopurinol   150 mg Oral Daily   aspirin  EC  81 mg Oral Daily   Chlorhexidine  Gluconate Cloth  6 each Topical Daily   dapagliflozin  propanediol  10 mg Oral Daily   fesoterodine   4 mg Oral QODAY   potassium chloride   40 mEq Oral BID   tamsulosin   0.4 mg Oral QODAY   Warfarin - Pharmacist Dosing Inpatient   Does not apply q1600   Infusions:   amiodarone  60 mg/hr (01/07/24 1000)   furosemide      furosemide  (LASIX ) 200 mg in dextrose  5 % 100 mL (2 mg/mL) infusion 20 mg/hr (01/07/24 1000)   milrinone  0.2 mcg/kg/min (01/07/24 1000)   norepinephrine  (LEVOPHED ) Adult infusion     PRN: mouth rinse, traZODone   Assessment: 83 yo male presenting with worsening shortness of breath. On warfarin PTA for afib with mechanical AVR.    Per patient, dose is 5 mg daily, poor historian. Upon chart review patient should be on 2.5 mg once daily on Monday, Tuesday, Thursday, Friday, and Sunday and 1 mg on Wednesday and Saturday per last PCP note on 12/18/2023. Goal INR is 2.5-3.5.   INR today is slightly subtherapeutic at 2.4. Hgb 12.9, plt trending down to 131. Pertinent DDI's - amio IV @60  (none PTA, 6/12 > ), allopurinol  (PTA).  Goal of Therapy:  INR 2.5-3.5 Monitor platelets by anticoagulation protocol: Yes   Plan:  Warfarin 3 mg x 1  Monitor for s/s of hemorrhage, daily INR, CBC Watch for new DDIs  Thank you for allowing pharmacy to participate in this patient's care,  Nieves Bars, PharmD, BCCCP Clinical Pharmacist  Phone: [phone] 01/07/2024 10:25 AM  Please check AMION for all [facility] Pharmacy phone numbers After 10:00 PM, call Main Pharmacy [phone]

## 2024-01-07 NOTE — Plan of Care (Signed)
   Problem: Clinical Measurements: Goal: Ability to maintain clinical measurements within normal limits will improve Outcome: Progressing Goal: Will remain free from infection Outcome: Progressing   Problem: Coping: Goal: Level of anxiety will decrease Outcome: Progressing

## 2024-01-08 DIAGNOSIS — R570 Cardiogenic shock: Secondary | ICD-10-CM | POA: Diagnosis not present

## 2024-01-08 LAB — BASIC METABOLIC PANEL WITH GFR
Anion gap: 14 (ref 5–15)
Anion gap: 15 (ref 5–15)
Anion gap: 15 (ref 5–15)
BUN: 58 mg/dL — ABNORMAL HIGH (ref 8–23)
BUN: 56 mg/dL — ABNORMAL HIGH (ref 8–23)
BUN: 55 mg/dL — ABNORMAL HIGH (ref 8–23)
CO2: 27 mmol/L (ref 22–32)
CO2: 26 mmol/L (ref 22–32)
CO2: 28 mmol/L (ref 22–32)
Calcium: 8.5 mg/dL — ABNORMAL LOW (ref 8.9–10.3)
Calcium: 8.5 mg/dL — ABNORMAL LOW (ref 8.9–10.3)
Calcium: 8.6 mg/dL — ABNORMAL LOW (ref 8.9–10.3)
Chloride: 83 mmol/L — ABNORMAL LOW (ref 98–111)
Chloride: 81 mmol/L — ABNORMAL LOW (ref 98–111)
Chloride: 80 mmol/L — ABNORMAL LOW (ref 98–111)
Creatinine, Ser: 1.74 mg/dL — ABNORMAL HIGH (ref 0.61–1.24)
Creatinine, Ser: 1.86 mg/dL — ABNORMAL HIGH (ref 0.61–1.24)
Creatinine, Ser: 1.8 mg/dL — ABNORMAL HIGH (ref 0.61–1.24)
GFR, Estimated: 39 mL/min — ABNORMAL LOW (ref >60)
GFR, Estimated: 36 mL/min — ABNORMAL LOW (ref >60)
GFR, Estimated: 37 mL/min — ABNORMAL LOW (ref >60)
Glucose, Bld: 157 mg/dL — ABNORMAL HIGH (ref 70–99)
Glucose, Bld: 191 mg/dL — ABNORMAL HIGH (ref 70–99)
Glucose, Bld: 169 mg/dL — ABNORMAL HIGH (ref 70–99)
Potassium: 4.1 mmol/L (ref 3.5–5.1)
Potassium: 4.5 mmol/L (ref 3.5–5.1)
Potassium: 3.5 mmol/L (ref 3.5–5.1)
Sodium: 124 mmol/L — ABNORMAL LOW (ref 135–145)
Sodium: 122 mmol/L — ABNORMAL LOW (ref 135–145)
Sodium: 123 mmol/L — ABNORMAL LOW (ref 135–145)

## 2024-01-08 LAB — CBC
HCT: 42.7 % (ref 39.0–52.0)
Hemoglobin: 13.9 g/dL (ref 13.0–17.0)
MCH: 20.3 pg — ABNORMAL LOW (ref 26.0–34.0)
MCHC: 32.6 g/dL (ref 30.0–36.0)
MCV: 62.3 fL — ABNORMAL LOW (ref 80.0–100.0)
Platelets: 151 10*3/uL (ref 150–400)
RBC: 6.85 MIL/uL — ABNORMAL HIGH (ref 4.22–5.81)
RDW: 19 % — ABNORMAL HIGH (ref 11.5–15.5)
WBC: 13.2 10*3/uL — ABNORMAL HIGH (ref 4.0–10.5)
nRBC: 1.1 % — ABNORMAL HIGH (ref 0.0–0.2)

## 2024-01-08 LAB — GLUCOSE, CAPILLARY
Glucose-Capillary: 196 mg/dL — ABNORMAL HIGH (ref 70–99)
Glucose-Capillary: 182 mg/dL — ABNORMAL HIGH (ref 70–99)

## 2024-01-08 LAB — PROTIME-INR
INR: 2.4 — ABNORMAL HIGH (ref 0.8–1.2)
Prothrombin Time: 26.5 s — ABNORMAL HIGH (ref 11.4–15.2)

## 2024-01-08 LAB — COOXEMETRY PANEL
Carboxyhemoglobin: 1.6 % — ABNORMAL HIGH (ref 0.5–1.5)
Methemoglobin: <0.7 % (ref 0.0–1.5)
O2 Saturation: 62.5 %
Total hemoglobin: 14.2 g/dL (ref 12.0–16.0)

## 2024-01-08 LAB — SODIUM, URINE, RANDOM: Sodium, Ur: 33 mmol/L

## 2024-01-08 LAB — MAGNESIUM: Magnesium: 2.5 mg/dL — ABNORMAL HIGH (ref 1.7–2.4)

## 2024-01-08 LAB — HEMOGLOBIN A1C
Hgb A1c MFr Bld: 6.6 % — ABNORMAL HIGH (ref 4.8–5.6)
Mean Plasma Glucose: 142.72 mg/dL

## 2024-01-08 MED ORDER — INSULIN ASPART 100 UNIT/ML IJ SOLN
0.0000 [IU] | Freq: Three times a day (TID) | INTRAMUSCULAR | Status: DC
  Administered 2024-01-08: 2 [IU] via SUBCUTANEOUS
  Administered 2024-01-09: 3 [IU] via SUBCUTANEOUS

## 2024-01-08 MED ORDER — POTASSIUM CHLORIDE CRYS ER 20 MEQ PO TBCR
40.0000 meq | EXTENDED_RELEASE_TABLET | Freq: Once | ORAL | Status: AC
  Administered 2024-01-08: 40 meq via ORAL
  Filled 2024-01-08: qty 2

## 2024-01-08 MED ORDER — SORBITOL 70 % SOLN
30.0000 mL | Freq: Once | Status: AC
  Administered 2024-01-08: 30 mL via ORAL
  Filled 2024-01-08: qty 30

## 2024-01-08 MED ORDER — METOLAZONE 5 MG PO TABS
5.0000 mg | ORAL_TABLET | Freq: Once | ORAL | Status: AC
  Administered 2024-01-08: 5 mg via ORAL
  Filled 2024-01-08: qty 1

## 2024-01-08 MED ORDER — SODIUM CHLORIDE 3 % IV BOLUS
150.0000 mL | Freq: Once | INTRAVENOUS | Status: AC
  Administered 2024-01-08: 150 mL via INTRAVENOUS
  Filled 2024-01-08: qty 500

## 2024-01-08 MED ORDER — NOREPINEPHRINE 16 MG/250ML-% IV SOLN
6.0000 ug/min | INTRAVENOUS | Status: DC
  Administered 2024-01-09 – 2024-01-10 (×2): 10 ug/min via INTRAVENOUS
  Filled 2024-01-08 (×2): qty 250

## 2024-01-08 MED ORDER — SENNOSIDES-DOCUSATE SODIUM 8.6-50 MG PO TABS
1.0000 | ORAL_TABLET | Freq: Two times a day (BID) | ORAL | Status: DC
  Administered 2024-01-08 – 2024-01-13 (×11): 1 via ORAL
  Filled 2024-01-08 (×11): qty 1

## 2024-01-08 MED ORDER — POLYETHYLENE GLYCOL 3350 17 G PO PACK
17.0000 g | PACK | Freq: Every day | ORAL | Status: DC
  Administered 2024-01-08 – 2024-01-12 (×5): 17 g via ORAL
  Filled 2024-01-08 (×6): qty 1

## 2024-01-08 MED ORDER — WARFARIN SODIUM 3 MG PO TABS
3.0000 mg | ORAL_TABLET | Freq: Once | ORAL | Status: AC
  Administered 2024-01-08: 3 mg via ORAL
  Filled 2024-01-08: qty 1

## 2024-01-08 NOTE — Progress Notes (Signed)
 ANTICOAGULATION CONSULT NOTE  Pharmacy Consult for Warfarin Indication: afib with mechanical AVR  Allergies  Allergen Reactions   Celecoxib Rash    Other Reaction(s): Unknown    Patient Measurements: Height: 5' 10 (177.8 cm) Weight: 82.7 kg (182 lb 5.1 oz) IBW/kg (Calculated) : 73  Vital Signs: Temp: 97.6 F (36.4 C) (06/17 0800) Temp Source: Oral (06/17 0800) BP: 87/66 (06/17 0915) Pulse Rate: 82 (06/17 0915)  Labs: Recent Labs    01/06/24 0302 01/07/24 0348 01/07/24 1326 01/07/24 1923 01/08/24 0420  HGB 13.8 12.9*  --   --  13.9  HCT 42.3 40.1  --   --  42.7  PLT 165 131*  --   --  151  LABPROT 30.6* 26.2*  --   --  26.5*  INR 2.9* 2.4*  --   --  2.4*  CREATININE 1.92* 1.84* 1.90* 1.83* 1.74*    Estimated Creatinine Clearance: 33.8 mL/min (A) (by C-G formula based on SCr of 1.74 mg/dL (H)).   Medical History: Past Medical History:  Diagnosis Date   Anticoagulant long-term use    Arthritis    probably in my thumbs (06/26/2012)   Bifascicular block    Chronic combined systolic and diastolic CHF (congestive heart failure) (HCC)    a. 01/2016 Echo: EF 45-50%, gr2 DD, inflat AK (poor acoustic windows);  b. 07/2016 Echo: EF 25-30%, mild LVH, PASP (pt in Afib).   Complication of anesthesia    wake up w/a start; hallucinations (06/26/2012)   Critical Aortic Stenosis    a. 03/2003 s/p SJM #25 mech prosthetic AoV;  b. 07/2016 Echo: EF 25-30% (in setting of AF), AoV area 1.72 cm^2 (VTI), 1.75 cm^2 (Vmean).   Diverticulosis    Gouty arthritis    have had it in both feet, ankles, right knee (06/26/2012)   History of diverticulitis of colon    History of kidney stones    History of pancreatitis    Hypertension    Incomplete RBBB    PAF (paroxysmal atrial fibrillation) (HCC)    a. CHA2DS2VASc = 4-->chronic coumadin  in setting of mech AoV;  b. 07/2016 Recurrent AF RVR.    Medications:  Medications Prior to Admission  Medication Sig Dispense Refill Last  Dose/Taking   acetaminophen  (TYLENOL ) 500 MG tablet Take 500 mg by mouth 2 (two) times daily.   01/01/2024   albuterol  (VENTOLIN  HFA) 108 (90 Base) MCG/ACT inhaler Inhale 2 puffs into the lungs every 4 (four) hours.   01/01/2024   allopurinol  (ZYLOPRIM ) 300 MG tablet Take 150 mg by mouth daily.   01/01/2024   aspirin  EC 81 MG tablet Take 81 mg by mouth daily. Swallow whole.   01/01/2024   clobetasol cream (TEMOVATE) 0.05 % Apply 1 Application topically 2 (two) times daily.   01/01/2024   colchicine  0.6 MG tablet Take 1 tablet (0.6 mg total) by mouth daily as needed (for gout).   Unknown   FARXIGA  10 MG TABS tablet TAKE 1 TABLET BY MOUTH EVERY DAY 30 tablet 6 01/01/2024   FIBER PO Take 2 capsules by mouth 2 (two) times daily.   01/01/2024   folic acid  (FOLVITE ) 1 MG tablet TAKE 1 TABLET(1 MG) BY MOUTH DAILY 90 tablet 3 01/01/2024   metoprolol  succinate (TOPROL -XL) 25 MG 24 hr tablet TAKE 1 TABLET BY MOUTH EVERYDAY AT BEDTIME 90 tablet 1 12/31/2023   potassium chloride  SA (KLOR-CON  M) 20 MEQ tablet Take 1 tablet (20 mEq total) by mouth daily.   01/01/2024  tamsulosin  (FLOMAX ) 0.4 MG CAPS capsule Take 1 capsule (0.4 mg total) by mouth every other day. 90 capsule 3 01/01/2024   tolterodine  (DETROL  LA) 4 MG 24 hr capsule Take 1 capsule (4 mg total) by mouth every other day. Alternating days with the tamsulosin  90 capsule 3 12/31/2023   torsemide  (DEMADEX ) 20 MG tablet Take 3 tablets (60 mg total) by mouth daily.   01/01/2024   warfarin (COUMADIN ) 5 MG tablet Take 0.5-1 tablets (2.5-5 mg total) by mouth See admin instructions. 1/19 and 1/20  take 7.5mg  (1 and 1/2 tab) then take 1 tab (5mg ) daily until seen by MD  Take 1 tablet (5 mg) by mouth on Sundays, Mondays, Wednesdays, Thursdays & Saturdays in the evening. Take 0.5 tablet (2.5 mg) by mouth on Tuesdays & Fridays in the evening. (Patient taking differently: Take 2.5-5 mg by mouth See admin instructions. AS directed by PCP 2.5 mg Daily) 45 tablet 11 12/31/2023 at   8:00 PM   docusate sodium  (COLACE) 100 MG capsule Take 100 mg by mouth 2 (two) times daily. (Patient not taking: Reported on 01/01/2024)   Not Taking   polyethylene glycol powder (GLYCOLAX /MIRALAX ) 17 GM/SCOOP powder Take 17 g by mouth 2 (two) times daily as needed for moderate constipation or mild constipation. (Patient not taking: Reported on 12/26/2023) 238 g 2    Scheduled:   allopurinol   150 mg Oral Daily   aspirin  EC  81 mg Oral Daily   Chlorhexidine  Gluconate Cloth  6 each Topical Daily   dapagliflozin  propanediol  10 mg Oral Daily   fesoterodine   4 mg Oral QODAY   polyethylene glycol  17 g Oral Daily   senna-docusate  1 tablet Oral BID   tamsulosin   0.4 mg Oral QODAY   Warfarin - Pharmacist Dosing Inpatient   Does not apply q1600   Infusions:   amiodarone  60 mg/hr (01/08/24 0916)   furosemide  (LASIX ) 200 mg in dextrose  5 % 100 mL (2 mg/mL) infusion 30 mg/hr (01/08/24 0900)   milrinone  0.25 mcg/kg/min (01/08/24 0900)   norepinephrine  (LEVOPHED ) Adult infusion 4 mcg/min (01/08/24 0922)   PRN: mouth rinse, traZODone   Assessment: 83 yo male presenting with worsening shortness of breath. On warfarin PTA for afib with mechanical AVR.    Per patient, dose is 5 mg daily, poor historian. Upon chart review patient should be on 2.5 mg once daily on Monday, Tuesday, Thursday, Friday, and Sunday and 1 mg on Wednesday and Saturday per last PCP note on 12/18/2023. Goal INR is 2.5-3.5.   INR today remains slightly subtherapeutic at 2.4. Hgb 13.9, plt up slightly to 151. Pertinent DDI's - amio IV @60  (none PTA, 6/12 > ), allopurinol  (PTA).  Goal of Therapy:  INR 2.5-3.5 Monitor platelets by anticoagulation protocol: Yes   Plan:  Order another warfarin 3 mg x 1 tonight (above PTA dose) Monitor for s/sx of bleeding, daily INR, CBC Watch for new DDIs  Thank you for allowing pharmacy to participate in this patient's care,  Nieves Bars, PharmD, BCCCP Clinical Pharmacist  Phone:  [phone] 01/08/2024 9:24 AM  Please check AMION for all [facility] Pharmacy phone numbers After 10:00 PM, call Main Pharmacy [phone]

## 2024-01-08 NOTE — Progress Notes (Signed)
   Inpatient Rehab Admissions Coordinator :  Per  change in OT therapy recommendations, patient was screened for CIR candidacy by Jeannetta Millman RN MSN.  At this time patient appears to be a potential candidate for CIR. I will place a rehab consult per protocol for full assessment. Please call me with any questions.  Jeannetta Millman RN MSN Admissions Coordinator [phone]

## 2024-01-08 NOTE — Progress Notes (Signed)
 Advanced Heart Failure Rounding Note  [facility] Cardiologist: Carollynn Cirri, MD Chief Complaint: Cardiogenic Shock Subjective:    Moved to ICU on 6/11 for cardiogenic shock.    Coox 63%. CVP 8-9. On Milrinone  0.25 mcg/kg/min + NE 3.  MAP in 70s with narrow pulse pressure and SBP in 80-90s. Remains in AFib 80-90s 1.9L UOP (net + 750) on Lasix  30/hr, given 3% NaCl to assist with hypochloremia and diuresis.  sCr 1.94>1.83>1.74  Sitting up in chair. Family at bedside. No SOB, CP.   Objective:    Vital Signs:   Temp:  [97.6 F (36.4 C)-97.8 F (36.6 C)] 97.6 F (36.4 C) (06/17 0800) Pulse Rate:  [80-101] 81 (06/17 1100) Resp:  [13-34] 29 (06/17 1100) BP: (77-133)/(47-106) 93/74 (06/17 1100) SpO2:  [90 %-98 %] 90 % (06/17 1100) Weight:  [82.7 kg] 82.7 kg (06/17 0615) Last BM Date : 01/05/24  Weight change: Filed Weights   01/06/24 0500 01/07/24 0408 01/08/24 0615  Weight: 83.9 kg 85.8 kg 82.7 kg   Intake/Output:  Intake/Output Summary (Last 24 hours) at 01/08/2024 1112 Last data filed at 01/08/2024 1100 Gross per 24 hour  Intake 3503.68 ml  Output 1975 ml  Net 1528.68 ml    Physical Exam: CVP 9 General: Elderly appearing. No distress on Shepherd Cardiac:  S1 and S2 present. No murmurs or rub. Extremities: Warm and dry.  No peripherla edema.  Neuro: Alert and oriented x3. Affect pleasant. Moves all extremities without difficulty. Lines/Devices:  LIJ CVL    Telemetry: Afib in 80s (personally reviewed)  Labs: Basic Metabolic Panel: Recent Labs  Lab 01/05/24 0441 01/06/24 0302 01/07/24 0348 01/07/24 1326 01/07/24 1923 01/08/24 0420  NA 125* 124* 122* 124* 122* 124*  K 5.3* 4.3 3.6 4.2 3.8 4.1  CL 83* 81* 82* 80* 80* 83*  CO2 26 28 28 28 30 27   GLUCOSE 173* 170* 131* 180* 268* 157*  BUN 73* 71* 67* 65* 60* 58*  CREATININE 1.92* 1.92* 1.84* 1.90* 1.83* 1.74*  CALCIUM 8.8* 8.7* 8.4* 8.8* 8.5* 8.5*  MG 2.5* 2.6* 2.5*  --  2.5* 2.5*   Liver Function Tests: Recent Labs   Lab 01/01/24 1124 01/03/24 0449  AST 90* 110*  ALT 73* 124*  ALKPHOS 145* 158*  BILITOT 3.4* 3.2*  PROT 6.7 6.4*  ALBUMIN 3.7 3.4*   CBC: Recent Labs  Lab 01/01/24 1124 01/02/24 0426 01/04/24 1918 01/05/24 0441 01/06/24 0302 01/07/24 0348 01/08/24 0420  WBC 15.4*   < > 14.3* 15.1* 14.3* 11.1* 13.2*  NEUTROABS 13.3*  --   --   --   --   --   --   HGB 15.0   < > 14.5 14.0 13.8 12.9* 13.9  HCT 48.1   < > 44.9 43.1 42.3 40.1 42.7  MCV 64.7*   < > 62.9* 62.6* 62.3* 62.2* 62.3*  PLT 162   < > 159 164 165 131* 151   < > = values in this interval not displayed.   Medications:    Scheduled Medications:  allopurinol   150 mg Oral Daily   aspirin  EC  81 mg Oral Daily   Chlorhexidine  Gluconate Cloth  6 each Topical Daily   dapagliflozin  propanediol  10 mg Oral Daily   fesoterodine   4 mg Oral QODAY   polyethylene glycol  17 g Oral Daily   senna-docusate  1 tablet Oral BID   tamsulosin   0.4 mg Oral QODAY   warfarin  3 mg Oral ONCE-1600   Warfarin -  Pharmacist Dosing Inpatient   Does not apply q1600   Infusions:  amiodarone  60 mg/hr (01/08/24 1100)   furosemide  (LASIX ) 200 mg in dextrose  5 % 100 mL (2 mg/mL) infusion 30 mg/hr (01/08/24 1100)   milrinone  0.25 mcg/kg/min (01/08/24 1100)   norepinephrine  (LEVOPHED ) Adult infusion 8 mcg/min (01/08/24 1100)   PRN Medications: mouth rinse, traZODone   Assessment/Plan:   1. Acute on chronic systolic CHF -> cardiogenic shock:  - Coronary angiography in 1/23  mild CAD.  - Echo 01/03/24  EF 25-30% RV mod reduced - Has developed recurrent HF/shock in setting of recurrent AF - Initial LA 2.1. Co-ox 41%  - Coox 63%. CVP 9.  - Continue Milrinone  0.25 mcg/kg/min - Poor urine response to drip. Continue Lasix  30/hr. Give metolazone  5 mg today. - Given 3% NaCl to assist with diuretic resistance. Cl 80>83. Re-dose 3% NaCl 150 mg daily.   - Repeat afternoon BMET - Continue NE for SBP >90. Do not titrate lower than 3. - Continue to hold  entresto , toprol  XL, and spiro. - Continue Farxiga  10 mg daily.   2. Mechanical aortic valve: Continue warfarin and ASA 81. - INR 2.4, discussed with pharmD  3. Paroxysmal Atrial fibrillation/atrial flutter:  - Admitted 1/18 with atrial fibrillation with RVR and CHF.  EF down to 20-25%.  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   - now with cardiogenic shock in setting of recurrent AF.  - Continue amio - Failed DC-CV on 6/13 - Will need DCCV prior to discharge after amio load and diuresis  4. Ascending aortic aneurysm: 4.3 cm on 4/24 MRA chest.   5. AKI on CKD2: Creatinine 2.14 on admit. Baseline 1.5. Suspect cardio-renal.  - Cr stable at 1.74 today, BUN improving slowly - diuresis as above - continue to support with milrinone  and NE 3-10 - follow BMET   6. Hyperkalemia - rec'd lokelma . Improved with hemodynamic support   7. Shock liver - likely 2/2 low output/hepatic congestion - improving   8. Hyponatremia/hypokalemia - limit FW, diuresis  9. ILD - CT reviewed with P/CCM  Length of Stay: 7  CRITICAL CARE Performed by: [place] Asta Corbridge  Total critical care time: 14 minutes  -Critical care time was exclusive of separately billable procedures and treating other patients. -Critical care was necessary to treat or prevent imminent or life-threatening deterioration. -Critical care was time spent personally by me on the following activities: development of treatment plan with patient and/or surrogate as well as nursing, discussions with consultants, evaluation of patient's response to treatment, examination of patient, obtaining history from patient or surrogate, ordering and performing treatments and interventions, ordering and review of laboratory studies, ordering and review of radiographic studies, pulse oximetry and re-evaluation of patient's condition.  [place] Hena Ewalt, NP 01/08/2024, 11:12 AM  Advanced Heart Failure Team Pager [phone] (M-F; 7a - 4p)

## 2024-01-08 NOTE — TOC Progression Note (Signed)
 Transition of Care [facility]) - Progression Note    Patient Details  Name: Adrian Lambert MRN: [id number] Date of Birth: October 11, 1940  Transition of Care [facility]) CM/SW Contact  Benjiman Bras, RN Phone Number: [phone] 01/08/2024, 6:19 PM  Clinical Narrative:     TOC CM spoke to pt and dtr at bedside. Offered choice for HH, medicare.gov with ratings. Pt is being followed by CIR for IP rehab.  Will continue to follow dc needs.   Expected Discharge Plan: IP Rehab Facility Barriers to Discharge: Continued Medical Work up  Expected Discharge Plan and Services   Discharge Planning Services: CM Consult Post Acute Care Choice: IP Rehab Living arrangements for the past 2 months: Single Family Home                                       Social Determinants of Health (SDOH) Interventions SDOH Screenings   Food Insecurity: No Food Insecurity (01/02/2024)  Housing: Low Risk  (01/02/2024)  Transportation Needs: No Transportation Needs (01/02/2024)  Utilities: Not At Risk (01/02/2024)  Social Connections: Moderately Isolated (01/02/2024)  Tobacco Use: Low Risk  (01/02/2024)    Readmission Risk Interventions     No data to display

## 2024-01-08 NOTE — Progress Notes (Signed)
 CSW attempted to meet with patient at bedside to complete [facility] assessment. Patient was sound asleep and no family at bedside. CSW will attempt again tomorrow. Adrian Blase, LCSW, CCSW-MCS [phone]

## 2024-01-08 NOTE — Inpatient Diabetes Management (Signed)
 Inpatient Diabetes Program Recommendations  AACE/ADA: New Consensus Statement on Inpatient Glycemic Control (2015)  Target Ranges:  Prepandial:   less than 140 mg/dL      Peak postprandial:   less than 180 mg/dL (1-2 hours)      Critically ill patients:  140 - 180 mg/dL   Lab Results  Component Value Date   GLUCAP 123 (H) 01/09/2013    Review of Glycemic Control  Diabetes history: No known hx Outpatient Diabetes medications: Farxiga  10 mg daily Current orders for Inpatient glycemic control: Farxiga  10 mg daily  Inpatient Diabetes Program Recommendations:   Please consider: -Glycemic control order set with 0-9 units tid correction  Thank you, Shequilla Goodgame E. Cha Gomillion, RN, MSN, CDCES  Diabetes Coordinator Inpatient Glycemic Control Team Team Pager [phone] (8am-5pm) 01/08/2024 12:11 PM

## 2024-01-08 NOTE — Progress Notes (Signed)
 Advanced Heart Failure Progress Update  Afternoon BMP reviewed. Na 122. Cl 81. Cr stable 183>1.74>1.84.   - Re-dose 3% NaCl 150 mg IV.  - Re-dose metolazone  5 mg  - Check urine Na 1 hour post infusion  - BMET at 2200  [place] Syvanna Ciolino, NP 01/08/24, 3:45 PM  Advanced Heart Failure Team Pager [phone] (M-F; 7a - 5p). For after hours see Amion for HF MD on call.

## 2024-01-08 NOTE — Progress Notes (Signed)
 Occupational Therapy Treatment Patient Details Name: Adrian Lambert MRN: [id number] DOB: January 25, 1941 Today's Date: 01/08/2024   History of present illness Pt is 83 yo presenting to [facility] ED on 6/10 due to shortness of breath Developed cardiogenic shock, progressive hypotension and signs of hyperperfusion. Admitted to ICU for ionotropes. PMH: arthritis, systolic and diastolic CHF, diverticulosis, HTN, incomplete RBBB, PAF   OT comments  Pt progressing well toward established OT goals. Noting plans for home with HHOT, however, pt continues to require mid A for transfers and min A for ambulation as well as significant assist for LB ADL with limited activity tolerance. Pt, son, and wife agreeable that he may benefit from intensive inpatient rehab following acute hospitalization to optimize return to PLOF. Due to significant functional decline and to reduce caregiver burden, recommending intensive multidisciplinary rehabilitation >3 hours/day to optimize safety and independence in ADL.        If plan is discharge home, recommend the following:  A little help with walking and/or transfers;A lot of help with bathing/dressing/bathroom;Assistance with cooking/housework;Assist for transportation;Help with stairs or ramp for entrance   Equipment Recommendations  None recommended by OT    Recommendations for Other Services      Precautions / Restrictions Precautions Precautions: Fall Recall of Precautions/Restrictions: Intact Precaution/Restrictions Comments: watch O2 sats, HR Restrictions Weight Bearing Restrictions Per Provider Order: No       Mobility Bed Mobility               General bed mobility comments: OOB in chair    Transfers Overall transfer level: Needs assistance Equipment used: None Transfers: Sit to/from Stand, Bed to chair/wheelchair/BSC Sit to Stand: Mod assist     Step pivot transfers: Min assist           Balance Overall balance assessment: Needs  assistance Sitting-balance support: No upper extremity supported, Feet supported Sitting balance-Leahy Scale: Fair     Standing balance support: No upper extremity supported, Single extremity supported, During functional activity Standing balance-Leahy Scale: Poor Standing balance comment: hunched/stooped posture in standing                           ADL either performed or assessed with clinical judgement   ADL Overall ADL's : Needs assistance/impaired         Upper Body Bathing: Set up;Sitting Upper Body Bathing Details (indicate cue type and reason): A to wash back but otherwise set-up A Lower Body Bathing: Moderate assistance Lower Body Bathing Details (indicate cue type and reason): cues for figure 4 with LLE and then bending over for RLE                     Functional mobility during ADLs: Minimal assistance (EVA)      Extremity/Trunk Assessment Upper Extremity Assessment Upper Extremity Assessment: Generalized weakness   Lower Extremity Assessment Lower Extremity Assessment: Defer to PT evaluation        Vision       Perception     Praxis     Communication Communication Communication: Impaired Factors Affecting Communication: Reduced clarity of speech (hard of hearing)   Cognition Arousal: Alert Behavior During Therapy: WFL for tasks assessed/performed Cognition: No apparent impairments             OT - Cognition Comments: slowed processing at times, but also hard of hearing  Following commands: Intact        Cueing   Cueing Techniques: Verbal cues  Exercises      Shoulder Instructions       General Comments      Pertinent Vitals/ Pain       Pain Assessment Pain Assessment: No/denies pain  Home Living                                          Prior Functioning/Environment              Frequency  Min 2X/week        Progress Toward Goals  OT Goals(current goals  can now be found in the care plan section)  Progress towards OT goals: Progressing toward goals  Acute Rehab OT Goals OT Goal Formulation: With patient Time For Goal Achievement: 01/17/24 Potential to Achieve Goals: Good ADL Goals Pt Will Perform Grooming: with supervision;standing Pt Will Perform Lower Body Bathing: with supervision;sit to/from stand Pt Will Perform Lower Body Dressing: with supervision;sit to/from stand Pt Will Transfer to Toilet: with supervision;ambulating Pt Will Perform Toileting - Clothing Manipulation and hygiene: with supervision;sit to/from stand Additional ADL Goal #1: Pt will generalize energy conservation strategies in ADLs and mobility.  Plan      Co-evaluation                 AM-PAC OT 6 Clicks Daily Activity     Outcome Measure   Help from another person eating meals?: None Help from another person taking care of personal grooming?: A Little Help from another person toileting, which includes using toliet, bedpan, or urinal?: A Lot Help from another person bathing (including washing, rinsing, drying)?: A Lot Help from another person to put on and taking off regular upper body clothing?: A Little Help from another person to put on and taking off regular lower body clothing?: A Lot 6 Click Score: 16    End of Session Equipment Utilized During Treatment: Gait belt (EVA)  OT Visit Diagnosis: Unsteadiness on feet (R26.81);Muscle weakness (generalized) (M62.81);Other (comment)   Activity Tolerance Patient tolerated treatment well   Patient Left in chair;with call bell/phone within reach;with family/visitor present   Nurse Communication Mobility status        Time: 1528-1600 OT Time Calculation (min): 32 min  Charges: OT General Charges $OT Visit: 1 Visit OT Treatments $Self Care/Home Management : 8-22 mins $Therapeutic Activity: 8-22 mins  Emery Hans, OTD, OTR/L [facility] Acute Rehabilitation Office: [phone]   Emery Hans 01/08/2024, 4:14 PM

## 2024-01-09 DIAGNOSIS — R570 Cardiogenic shock: Secondary | ICD-10-CM | POA: Diagnosis not present

## 2024-01-09 LAB — BASIC METABOLIC PANEL WITH GFR
Anion gap: 14 (ref 5–15)
Anion gap: 16 — ABNORMAL HIGH (ref 5–15)
BUN: 52 mg/dL — ABNORMAL HIGH (ref 8–23)
BUN: 50 mg/dL — ABNORMAL HIGH (ref 8–23)
CO2: 28 mmol/L (ref 22–32)
CO2: 27 mmol/L (ref 22–32)
Calcium: 8.6 mg/dL — ABNORMAL LOW (ref 8.9–10.3)
Calcium: 8.8 mg/dL — ABNORMAL LOW (ref 8.9–10.3)
Chloride: 81 mmol/L — ABNORMAL LOW (ref 98–111)
Chloride: 80 mmol/L — ABNORMAL LOW (ref 98–111)
Creatinine, Ser: 1.72 mg/dL — ABNORMAL HIGH (ref 0.61–1.24)
Creatinine, Ser: 1.85 mg/dL — ABNORMAL HIGH (ref 0.61–1.24)
GFR, Estimated: 39 mL/min — ABNORMAL LOW (ref >60)
GFR, Estimated: 36 mL/min — ABNORMAL LOW (ref >60)
Glucose, Bld: 161 mg/dL — ABNORMAL HIGH (ref 70–99)
Glucose, Bld: 139 mg/dL — ABNORMAL HIGH (ref 70–99)
Potassium: 3.5 mmol/L (ref 3.5–5.1)
Potassium: 3.7 mmol/L (ref 3.5–5.1)
Sodium: 123 mmol/L — ABNORMAL LOW (ref 135–145)
Sodium: 123 mmol/L — ABNORMAL LOW (ref 135–145)

## 2024-01-09 LAB — CBC
HCT: 44.6 % (ref 39.0–52.0)
Hemoglobin: 14.5 g/dL (ref 13.0–17.0)
MCH: 20.1 pg — ABNORMAL LOW (ref 26.0–34.0)
MCHC: 32.5 g/dL (ref 30.0–36.0)
MCV: 61.8 fL — ABNORMAL LOW (ref 80.0–100.0)
Platelets: 167 10*3/uL (ref 150–400)
RBC: 7.22 MIL/uL — ABNORMAL HIGH (ref 4.22–5.81)
RDW: 19.9 % — ABNORMAL HIGH (ref 11.5–15.5)
WBC: 16.6 10*3/uL — ABNORMAL HIGH (ref 4.0–10.5)
nRBC: 1 % — ABNORMAL HIGH (ref 0.0–0.2)

## 2024-01-09 LAB — PROTIME-INR
INR: 2.7 — ABNORMAL HIGH (ref 0.8–1.2)
Prothrombin Time: 29 s — ABNORMAL HIGH (ref 11.4–15.2)

## 2024-01-09 LAB — COOXEMETRY PANEL
Carboxyhemoglobin: 1.7 % — ABNORMAL HIGH (ref 0.5–1.5)
Methemoglobin: 1.4 % (ref 0.0–1.5)
O2 Saturation: 69.9 %
Total hemoglobin: 15.2 g/dL (ref 12.0–16.0)

## 2024-01-09 LAB — MAGNESIUM: Magnesium: 2.4 mg/dL (ref 1.7–2.4)

## 2024-01-09 LAB — GLUCOSE, CAPILLARY
Glucose-Capillary: 202 mg/dL — ABNORMAL HIGH (ref 70–99)
Glucose-Capillary: 195 mg/dL — ABNORMAL HIGH (ref 70–99)
Glucose-Capillary: 158 mg/dL — ABNORMAL HIGH (ref 70–99)
Glucose-Capillary: 162 mg/dL — ABNORMAL HIGH (ref 70–99)

## 2024-01-09 MED ORDER — POTASSIUM CHLORIDE CRYS ER 20 MEQ PO TBCR
60.0000 meq | EXTENDED_RELEASE_TABLET | Freq: Once | ORAL | Status: AC
  Administered 2024-01-09: 60 meq via ORAL
  Filled 2024-01-09: qty 3

## 2024-01-09 MED ORDER — METOLAZONE 5 MG PO TABS
5.0000 mg | ORAL_TABLET | Freq: Once | ORAL | Status: AC
  Administered 2024-01-09: 5 mg via ORAL
  Filled 2024-01-09: qty 1

## 2024-01-09 MED ORDER — SORBITOL 70 % SOLN
30.0000 mL | Freq: Once | Status: AC
  Administered 2024-01-09: 30 mL via ORAL
  Filled 2024-01-09: qty 30

## 2024-01-09 MED ORDER — WARFARIN SODIUM 1 MG PO TABS
1.0000 mg | ORAL_TABLET | Freq: Once | ORAL | Status: AC
  Administered 2024-01-09: 1 mg via ORAL
  Filled 2024-01-09: qty 1

## 2024-01-09 MED ORDER — POTASSIUM CHLORIDE CRYS ER 20 MEQ PO TBCR: 40.0000 meq | EXTENDED_RELEASE_TABLET | Freq: Once | ORAL | Status: DC

## 2024-01-09 MED ORDER — INSULIN ASPART 100 UNIT/ML IJ SOLN
0.0000 [IU] | Freq: Three times a day (TID) | INTRAMUSCULAR | Status: DC
  Administered 2024-01-09 (×2): 3 [IU] via SUBCUTANEOUS
  Administered 2024-01-10: 5 [IU] via SUBCUTANEOUS
  Administered 2024-01-10: 2 [IU] via SUBCUTANEOUS
  Administered 2024-01-11: 8 [IU] via SUBCUTANEOUS
  Administered 2024-01-11: 3 [IU] via SUBCUTANEOUS
  Administered 2024-01-12: 5 [IU] via SUBCUTANEOUS
  Administered 2024-01-12: 2 [IU] via SUBCUTANEOUS
  Administered 2024-01-12 – 2024-01-13 (×3): 5 [IU] via SUBCUTANEOUS
  Administered 2024-01-14: 3 [IU] via SUBCUTANEOUS
  Administered 2024-01-14: 8 [IU] via SUBCUTANEOUS
  Administered 2024-01-14 – 2024-01-15 (×2): 5 [IU] via SUBCUTANEOUS
  Administered 2024-01-15: 3 [IU] via SUBCUTANEOUS
  Administered 2024-01-15 – 2024-01-16 (×2): 5 [IU] via SUBCUTANEOUS

## 2024-01-09 MED ORDER — POTASSIUM CHLORIDE CRYS ER 20 MEQ PO TBCR
40.0000 meq | EXTENDED_RELEASE_TABLET | Freq: Once | ORAL | Status: AC
  Administered 2024-01-09: 40 meq via ORAL
  Filled 2024-01-09: qty 2

## 2024-01-09 MED ORDER — INSULIN ASPART 100 UNIT/ML IJ SOLN: 0.0000 [IU] | Freq: Every day | INTRAMUSCULAR | Status: DC

## 2024-01-09 NOTE — Progress Notes (Signed)
 Advanced Heart Failure Rounding Note  [facility] Cardiologist: Carollynn Cirri, MD Chief Complaint: Cardiogenic Shock Subjective:    Moved to ICU on 6/11 for cardiogenic shock.    Coox 70%. CVP 10. On Milrinone  0.25 mcg/kg/min + NE 10 UOP picked up overnight. Cr stable 1.74  Sitting up in chair. Tired today, did not sleep well.   Objective:    Vital Signs:   Temp:  [97.6 F (36.4 C)-98 F (36.7 C)] 97.8 F (36.6 C) (06/18 0700) Pulse Rate:  [75-105] 96 (06/18 0800) Resp:  [11-31] 23 (06/18 0800) BP: (82-104)/(59-87) 95/74 (06/18 0800) SpO2:  [88 %-96 %] 91 % (06/18 0800) Weight:  [84.7 kg] 84.7 kg (06/18 0600) Last BM Date : 01/01/24  Weight change: Filed Weights   01/07/24 0408 01/08/24 0615 01/09/24 0600  Weight: 85.8 kg 82.7 kg 84.7 kg   Intake/Output:  Intake/Output Summary (Last 24 hours) at 01/09/2024 0854 Last data filed at 01/09/2024 0700 Gross per 24 hour  Intake 2829.57 ml  Output 3400 ml  Net -570.43 ml    Physical Exam: CVP 9 General: Weak appearing.  Cardiac: JVP flat. S1 and S2 present. No murmurs or rub. Extremities: Warm and dry.  2+ BLE edema.  Neuro: Alert and oriented x3. Affect pleasant.  Lines/Devices:  LIJ CVL     Telemetry: Afib in 80-90s (personally reviewed)  Labs: Basic Metabolic Panel: Recent Labs  Lab 01/06/24 0302 01/07/24 0348 01/07/24 1326 01/07/24 1923 01/08/24 0420 01/08/24 1304 01/08/24 2128 01/09/24 0407  NA 124* 122*   < > 122* 124* 122* 123* 123*  K 4.3 3.6   < > 3.8 4.1 4.5 3.5 3.5  CL 81* 82*   < > 80* 83* 81* 80* 81*  CO2 28 28   < > 30 27 26 28 28   GLUCOSE 170* 131*   < > 268* 157* 191* 169* 161*  BUN 71* 67*   < > 60* 58* 56* 55* 52*  CREATININE 1.92* 1.84*   < > 1.83* 1.74* 1.86* 1.80* 1.72*  CALCIUM 8.7* 8.4*   < > 8.5* 8.5* 8.5* 8.6* 8.6*  MG 2.6* 2.5*  --  2.5* 2.5*  --   --  2.4   < > = values in this interval not displayed.   Liver Function Tests: Recent Labs  Lab 01/03/24 0449  AST 110*  ALT 124*   ALKPHOS 158*  BILITOT 3.2*  PROT 6.4*  ALBUMIN 3.4*   CBC: Recent Labs  Lab 01/05/24 0441 01/06/24 0302 01/07/24 0348 01/08/24 0420 01/09/24 0407  WBC 15.1* 14.3* 11.1* 13.2* 16.6*  HGB 14.0 13.8 12.9* 13.9 14.5  HCT 43.1 42.3 40.1 42.7 44.6  MCV 62.6* 62.3* 62.2* 62.3* 61.8*  PLT 164 165 131* 151 167   Medications:    Scheduled Medications:  allopurinol   150 mg Oral Daily   aspirin  EC  81 mg Oral Daily   Chlorhexidine  Gluconate Cloth  6 each Topical Daily   dapagliflozin  propanediol  10 mg Oral Daily   fesoterodine   4 mg Oral QODAY   insulin aspart  0-9 Units Subcutaneous TID WC   polyethylene glycol  17 g Oral Daily   senna-docusate  1 tablet Oral BID   tamsulosin   0.4 mg Oral QODAY   Warfarin - Pharmacist Dosing Inpatient   Does not apply q1600   Infusions:  amiodarone  60 mg/hr (01/09/24 0700)   furosemide  (LASIX ) 200 mg in dextrose  5 % 100 mL (2 mg/mL) infusion 40 mg/hr (01/09/24 0726)   milrinone   0.25 mcg/kg/min (01/09/24 0700)   norepinephrine  (LEVOPHED ) Adult infusion 10 mcg/min (01/09/24 0700)   PRN Medications: mouth rinse, traZODone   Assessment/Plan:   1. Acute on chronic systolic CHF -> cardiogenic shock:  - Coronary angiography in 1/23  mild CAD.  - Echo 01/03/24  EF 25-30% RV mod reduced - Has developed recurrent HF/shock in setting of recurrent AF - Initial LA 2.1. Co-ox 41%  - Coox 70%. CVP 9 - Continue Milrinone  0.25 mcg/kg/min - UOP marginal. Continue Lasix  30/hr. Give metolazone  5 mg today. - Give additional dose 3% NaCl to assist with diuretic resistance.   - Repeat afternoon BMET - Continue NE for SBP >90. Do not titrate lower than 3. - Continue to hold entresto , toprol  XL, and spiro. - Continue Farxiga  10 mg daily.   2. Mechanical aortic valve: Continue warfarin and ASA 81. - INR 2.7, discussed with pharmD  3. Paroxysmal Atrial fibrillation/atrial flutter:  - Admitted 1/18 with atrial fibrillation with RVR and CHF.  EF down to  20-25%.  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   - in cardiogenic shock in setting of recurrent AF.  - Failed DC-CV on 6/13 - continue amio gtt; in/out AF yesterday. In AF today, rate controlled.  - may need DCCV if does not convert with amio  4. Ascending aortic aneurysm: 4.3 cm on 4/24 MRA chest.   5. AKI on CKD2: Creatinine 2.14 on admit. Baseline 1.5. Suspect cardio-renal.  - Cr stable at 1.72 today, BUN improving slowly - diuresis as above - continue to support with milrinone  and NE 3-10 - follow BMET   6. Hyperkalemia - rec'd lokelma . Improved with hemodynamic support - supp for K <4   7. Shock liver - likely 2/2 low output/hepatic congestion - improving   8. Hyponatremia/hypokalemia - limit FW, diuresis  9. ILD - CT reviewed with P/CCM  Length of Stay: 8  CRITICAL CARE Performed by: [place] Warnell Rasnic  Total critical care time: 15 minutes  -Critical care time was exclusive of separately billable procedures and treating other patients. -Critical care was necessary to treat or prevent imminent or life-threatening deterioration. -Critical care was time spent personally by me on the following activities: development of treatment plan with patient and/or surrogate as well as nursing, discussions with consultants, evaluation of patient's response to treatment, examination of patient, obtaining history from patient or surrogate, ordering and performing treatments and interventions, ordering and review of laboratory studies, ordering and review of radiographic studies, pulse oximetry and re-evaluation of patient's condition.  [place] Sheli Dorin, NP 01/09/2024, 8:54 AM  Advanced Heart Failure Team Pager [phone] (M-F; 7a - 4p). For after hours, see Amion for on call HF MD

## 2024-01-09 NOTE — Progress Notes (Addendum)
 Physical Therapy Treatment Patient Details Name: Adrian Lambert MRN: [id number] DOB: 1940/08/03 Today's Date: 01/09/2024   History of Present Illness Pt is 83 yo presenting to [facility] ED on 6/10 due to shortness of breath Developed cardiogenic shock, progressive hypotension and signs of hyperperfusion. Admitted to ICU for ionotropes. PMH: arthritis, systolic and diastolic CHF, diverticulosis, HTN, incomplete RBBB, PAF    PT Comments  Patient reporting no sleep last night (although sleeping on my arrival). RN present and assisting throughout session. Patient required incr assist for supine to sit compared to previous sessions. Sit to stand with mod assist from EOB with incr time to come to fully upright with HHA. Stepped 3 ft from bed to chair with pt beginning to forward flex and reach for armrest when only 1/2 way to the chair with cues and support to stay upright and finish stepping. LE exercises completed pre- and post-OOB. Based on slower than anticipated progress, feel pt could benefit from intensive inpatient therapies >3 hrs/day.    If plan is discharge home, recommend the following: Help with stairs or ramp for entrance;Assist for transportation;Assistance with cooking/housework;Two people to help with walking and/or transfers   Can travel by private vehicle        Equipment Recommendations  None recommended by PT    Recommendations for Other Services       Precautions / Restrictions Precautions Precautions: Fall Recall of Precautions/Restrictions: Intact Precaution/Restrictions Comments: watch O2 sats, HR Restrictions Weight Bearing Restrictions Per Provider Order: No     Mobility  Bed Mobility Overal bed mobility: Needs Assistance Bed Mobility: Supine to Sit     Supine to sit: Mod assist, +2 for physical assistance, HOB elevated     General bed mobility comments: vc for sequencing; assist to raise torso    Transfers Overall transfer level: Needs  assistance Equipment used: 2 person hand held assist Transfers: Sit to/from Stand, Bed to chair/wheelchair/BSC Sit to Stand: Mod assist   Step pivot transfers: Min assist, +2 safety/equipment            Ambulation/Gait Ambulation/Gait assistance: Min assist, +2 safety/equipment Gait Distance (Feet): 3 Feet Assistive device: 2 person hand held assist Gait Pattern/deviations: Step-to pattern, Decreased stride length, Shuffle       General Gait Details: shuffling steps with poor foot clearance; vc for upright posture as pt began to forward flex and reach for arm of chair too soon   Stairs             Wheelchair Mobility     Tilt Bed    Modified Rankin (Stroke Patients Only)       Balance Overall balance assessment: Needs assistance Sitting-balance support: No upper extremity supported, Feet supported Sitting balance-Leahy Scale: Fair     Standing balance support: No upper extremity supported, Single extremity supported, During functional activity Standing balance-Leahy Scale: Poor Standing balance comment: pt seeking stability with UEs, tentative in standing, requires increased time to stand fully upright                            Communication Communication Communication: Impaired Factors Affecting Communication: Reduced clarity of speech  Cognition Arousal: Alert Behavior During Therapy: WFL for tasks assessed/performed   PT - Cognitive impairments: No apparent impairments                         Following commands: Intact      Cueing  Cueing Techniques: Verbal cues  Exercises General Exercises - Lower Extremity Ankle Circles/Pumps: AROM, Both, 10 reps Long Arc Quad: AROM, Both, 10 reps Heel Slides: AROM, Both, Supine, 5 reps    General Comments General comments (skin integrity, edema, etc.): RN assisted. Pt on more neo and BP soft with RN recommending not to push ambulation today.      Pertinent Vitals/Pain Pain  Assessment Pain Assessment: No/denies pain    Home Living                          Prior Function            PT Goals (current goals can now be found in the care plan section) Acute Rehab PT Goals Patient Stated Goal: to return home soon Time For Goal Achievement: 01/16/24 Potential to Achieve Goals: Good Progress towards PT goals: Progressing toward goals    Frequency    Min 2X/week      PT Plan      Co-evaluation              AM-PAC PT 6 Clicks Mobility   Outcome Measure  Help needed turning from your back to your side while in a flat bed without using bedrails?: A Little Help needed moving from lying on your back to sitting on the side of a flat bed without using bedrails?: A Little Help needed moving to and from a bed to a chair (including a wheelchair)?: A Lot Help needed standing up from a chair using your arms (e.g., wheelchair or bedside chair)?: A Lot Help needed to walk in hospital room?: Total Help needed climbing 3-5 steps with a railing? : Total 6 Click Score: 12    End of Session Equipment Utilized During Treatment: Gait belt Activity Tolerance: Patient limited by fatigue Patient left: with call bell/phone within reach;in chair;with nursing/sitter in room;with family/visitor present Nurse Communication: Mobility status PT Visit Diagnosis: Unsteadiness on feet (R26.81);Muscle weakness (generalized) (M62.81)     Time: 7829-5621 PT Time Calculation (min) (ACUTE ONLY): 18 min  Charges:    $Gait Training: 8-22 mins PT General Charges $$ ACUTE PT VISIT: 1 Visit                      [name], PT Acute Rehabilitation Services  Office [phone]    [name] 01/09/2024, 8:58 AM

## 2024-01-09 NOTE — Progress Notes (Signed)
 ANTICOAGULATION CONSULT NOTE  Pharmacy Consult for Warfarin Indication: afib with mechanical AVR  Allergies  Allergen Reactions   Celecoxib Rash    Other Reaction(s): Unknown    Patient Measurements: Height: 5' 10 (177.8 cm) Weight: 84.7 kg (186 lb 11.7 oz) IBW/kg (Calculated) : 73  Vital Signs: Temp: 97.8 F (36.6 C) (06/18 0700) Temp Source: Axillary (06/18 0700) BP: 95/74 (06/18 0800) Pulse Rate: 96 (06/18 0800)  Labs: Recent Labs    01/07/24 0348 01/07/24 1326 01/08/24 0420 01/08/24 1304 01/08/24 2128 01/09/24 0407  HGB 12.9*  --  13.9  --   --  14.5  HCT 40.1  --  42.7  --   --  44.6  PLT 131*  --  151  --   --  167  LABPROT 26.2*  --  26.5*  --   --  29.0*  INR 2.4*  --  2.4*  --   --  2.7*  CREATININE 1.84*   < > 1.74* 1.86* 1.80* 1.72*   < > = values in this interval not displayed.    Estimated Creatinine Clearance: 34.2 mL/min (A) (by C-G formula based on SCr of 1.72 mg/dL (H)).   Medical History: Past Medical History:  Diagnosis Date   Anticoagulant long-term use    Arthritis    probably in my thumbs (06/26/2012)   Bifascicular block    Chronic combined systolic and diastolic CHF (congestive heart failure) (HCC)    a. 01/2016 Echo: EF 45-50%, gr2 DD, inflat AK (poor acoustic windows);  b. 07/2016 Echo: EF 25-30%, mild LVH, PASP (pt in Afib).   Complication of anesthesia    wake up w/a start; hallucinations (06/26/2012)   Critical Aortic Stenosis    a. 03/2003 s/p SJM #25 mech prosthetic AoV;  b. 07/2016 Echo: EF 25-30% (in setting of AF), AoV area 1.72 cm^2 (VTI), 1.75 cm^2 (Vmean).   Diverticulosis    Gouty arthritis    have had it in both feet, ankles, right knee (06/26/2012)   History of diverticulitis of colon    History of kidney stones    History of pancreatitis    Hypertension    Incomplete RBBB    PAF (paroxysmal atrial fibrillation) (HCC)    a. CHA2DS2VASc = 4-->chronic coumadin  in setting of mech AoV;  b. 07/2016 Recurrent AF  RVR.    Medications:  Medications Prior to Admission  Medication Sig Dispense Refill Last Dose/Taking   acetaminophen  (TYLENOL ) 500 MG tablet Take 500 mg by mouth 2 (two) times daily.   01/01/2024   albuterol  (VENTOLIN  HFA) 108 (90 Base) MCG/ACT inhaler Inhale 2 puffs into the lungs every 4 (four) hours.   01/01/2024   allopurinol  (ZYLOPRIM ) 300 MG tablet Take 150 mg by mouth daily.   01/01/2024   aspirin  EC 81 MG tablet Take 81 mg by mouth daily. Swallow whole.   01/01/2024   clobetasol cream (TEMOVATE) 0.05 % Apply 1 Application topically 2 (two) times daily.   01/01/2024   colchicine  0.6 MG tablet Take 1 tablet (0.6 mg total) by mouth daily as needed (for gout).   Unknown   FARXIGA  10 MG TABS tablet TAKE 1 TABLET BY MOUTH EVERY DAY 30 tablet 6 01/01/2024   FIBER PO Take 2 capsules by mouth 2 (two) times daily.   01/01/2024   folic acid  (FOLVITE ) 1 MG tablet TAKE 1 TABLET(1 MG) BY MOUTH DAILY 90 tablet 3 01/01/2024   metoprolol  succinate (TOPROL -XL) 25 MG 24 hr tablet TAKE 1 TABLET BY  MOUTH EVERYDAY AT BEDTIME 90 tablet 1 12/31/2023   potassium chloride  SA (KLOR-CON  M) 20 MEQ tablet Take 1 tablet (20 mEq total) by mouth daily.   01/01/2024   tamsulosin  (FLOMAX ) 0.4 MG CAPS capsule Take 1 capsule (0.4 mg total) by mouth every other day. 90 capsule 3 01/01/2024   tolterodine  (DETROL  LA) 4 MG 24 hr capsule Take 1 capsule (4 mg total) by mouth every other day. Alternating days with the tamsulosin  90 capsule 3 12/31/2023   torsemide  (DEMADEX ) 20 MG tablet Take 3 tablets (60 mg total) by mouth daily.   01/01/2024   warfarin (COUMADIN ) 5 MG tablet Take 0.5-1 tablets (2.5-5 mg total) by mouth See admin instructions. 1/19 and 1/20  take 7.5mg  (1 and 1/2 tab) then take 1 tab (5mg ) daily until seen by MD  Take 1 tablet (5 mg) by mouth on Sundays, Mondays, Wednesdays, Thursdays & Saturdays in the evening. Take 0.5 tablet (2.5 mg) by mouth on Tuesdays & Fridays in the evening. (Patient taking differently: Take 2.5-5 mg  by mouth See admin instructions. AS directed by PCP 2.5 mg Daily) 45 tablet 11 12/31/2023 at  8:00 PM   docusate sodium  (COLACE) 100 MG capsule Take 100 mg by mouth 2 (two) times daily. (Patient not taking: Reported on 01/01/2024)   Not Taking   polyethylene glycol powder (GLYCOLAX /MIRALAX ) 17 GM/SCOOP powder Take 17 g by mouth 2 (two) times daily as needed for moderate constipation or mild constipation. (Patient not taking: Reported on 12/26/2023) 238 g 2    Scheduled:   allopurinol   150 mg Oral Daily   aspirin  EC  81 mg Oral Daily   Chlorhexidine  Gluconate Cloth  6 each Topical Daily   dapagliflozin  propanediol  10 mg Oral Daily   fesoterodine   4 mg Oral QODAY   insulin aspart  0-15 Units Subcutaneous TID WC   insulin aspart  0-5 Units Subcutaneous QHS   polyethylene glycol  17 g Oral Daily   senna-docusate  1 tablet Oral BID   tamsulosin   0.4 mg Oral QODAY   Warfarin - Pharmacist Dosing Inpatient   Does not apply q1600   Infusions:   amiodarone  60 mg/hr (01/09/24 0700)   furosemide  (LASIX ) 200 mg in dextrose  5 % 100 mL (2 mg/mL) infusion 40 mg/hr (01/09/24 0726)   milrinone  0.25 mcg/kg/min (01/09/24 0700)   norepinephrine  (LEVOPHED ) Adult infusion 10 mcg/min (01/09/24 0700)   PRN: mouth rinse, traZODone   Assessment: 83 yo male presenting with worsening shortness of breath. On warfarin PTA for afib with mechanical AVR.    Per patient, dose is 5 mg daily, poor historian. Upon chart review patient should be on 2.5 mg once daily on Monday, Tuesday, Thursday, Friday, and Sunday and 1 mg on Wednesday and Saturday per last PCP note on 12/18/2023. Goal INR is 2.5-3.5.   INR today is therapeutic at 2.7. Hgb 14.5, plt up slightly to 167. Pertinent DDI's - amio IV @60  (none PTA, 6/12 > ), allopurinol  (PTA). Only eating ~25% of any documented meals.  Goal of Therapy:  INR 2.5-3.5 Monitor platelets by anticoagulation protocol: Yes   Plan:  Order warfarin 1 mg tonight (as per PTA  regimen) Monitor for s/sx of bleeding, daily INR, CBC Watch for new DDIs  Thank you for allowing pharmacy to participate in this patient's care,  Nieves Bars, PharmD, BCCCP Clinical Pharmacist  Phone: [phone] 01/09/2024 8:56 AM  Please check AMION for all [facility] Pharmacy phone numbers After 10:00 PM, call Main Pharmacy [phone]

## 2024-01-09 NOTE — Progress Notes (Signed)
 CSW attempted to meet with patient at bedside to complete Paramedicine psychosocial assessment although patient sound asleep. Assessment will be completed at discharge when patient enrolled back into the program. Aubry Blase, [place], CCSW-MCS [phone]

## 2024-01-09 NOTE — Plan of Care (Signed)

## 2024-01-09 NOTE — Progress Notes (Signed)
  Inpatient Rehabilitation Admissions Coordinator   Met with patient at bedside for rehab assessment. We discussed goals and expectations of a possible CIR admit. I await further medical progress before proceeding with Auth for CIR.  Family can provide expected caregiver support that is recommended . Please call me with any questions.   Jeannetta Millman, RN, MSN Rehab Admissions Coordinator [phone]

## 2024-01-10 ENCOUNTER — Inpatient Hospital Stay (HOSPITAL_COMMUNITY)

## 2024-01-10 DIAGNOSIS — R570 Cardiogenic shock: Secondary | ICD-10-CM | POA: Diagnosis not present

## 2024-01-10 LAB — CBC
HCT: 43.9 % (ref 39.0–52.0)
Hemoglobin: 14.2 g/dL (ref 13.0–17.0)
MCH: 20 pg — ABNORMAL LOW (ref 26.0–34.0)
MCHC: 32.3 g/dL (ref 30.0–36.0)
MCV: 61.8 fL — ABNORMAL LOW (ref 80.0–100.0)
Platelets: 155 10*3/uL (ref 150–400)
RBC: 7.1 MIL/uL — ABNORMAL HIGH (ref 4.22–5.81)
RDW: 19.5 % — ABNORMAL HIGH (ref 11.5–15.5)
WBC: 16 10*3/uL — ABNORMAL HIGH (ref 4.0–10.5)
nRBC: 0.6 % — ABNORMAL HIGH (ref 0.0–0.2)

## 2024-01-10 LAB — MAGNESIUM: Magnesium: 2.3 mg/dL (ref 1.7–2.4)

## 2024-01-10 LAB — BASIC METABOLIC PANEL WITH GFR
Anion gap: 15 (ref 5–15)
Anion gap: 13 (ref 5–15)
BUN: 49 mg/dL — ABNORMAL HIGH (ref 8–23)
BUN: 50 mg/dL — ABNORMAL HIGH (ref 8–23)
CO2: 29 mmol/L (ref 22–32)
CO2: 32 mmol/L (ref 22–32)
Calcium: 8.7 mg/dL — ABNORMAL LOW (ref 8.9–10.3)
Calcium: 8.9 mg/dL (ref 8.9–10.3)
Chloride: 78 mmol/L — ABNORMAL LOW (ref 98–111)
Chloride: 78 mmol/L — ABNORMAL LOW (ref 98–111)
Creatinine, Ser: 1.72 mg/dL — ABNORMAL HIGH (ref 0.61–1.24)
Creatinine, Ser: 1.88 mg/dL — ABNORMAL HIGH (ref 0.61–1.24)
GFR, Estimated: 39 mL/min — ABNORMAL LOW (ref >60)
GFR, Estimated: 35 mL/min — ABNORMAL LOW (ref >60)
Glucose, Bld: 161 mg/dL — ABNORMAL HIGH (ref 70–99)
Glucose, Bld: 112 mg/dL — ABNORMAL HIGH (ref 70–99)
Potassium: 3.3 mmol/L — ABNORMAL LOW (ref 3.5–5.1)
Potassium: 3.9 mmol/L (ref 3.5–5.1)
Sodium: 122 mmol/L — ABNORMAL LOW (ref 135–145)
Sodium: 123 mmol/L — ABNORMAL LOW (ref 135–145)

## 2024-01-10 LAB — GLUCOSE, CAPILLARY
Glucose-Capillary: 142 mg/dL — ABNORMAL HIGH (ref 70–99)
Glucose-Capillary: 108 mg/dL — ABNORMAL HIGH (ref 70–99)
Glucose-Capillary: 133 mg/dL — ABNORMAL HIGH (ref 70–99)

## 2024-01-10 LAB — PROTIME-INR
INR: 2.7 — ABNORMAL HIGH (ref 0.8–1.2)
Prothrombin Time: 29.2 s — ABNORMAL HIGH (ref 11.4–15.2)

## 2024-01-10 MED ORDER — POTASSIUM CHLORIDE CRYS ER 20 MEQ PO TBCR
40.0000 meq | EXTENDED_RELEASE_TABLET | ORAL | Status: AC
  Administered 2024-01-10 (×2): 40 meq via ORAL
  Filled 2024-01-10: qty 2

## 2024-01-10 MED ORDER — ONDANSETRON HCL 4 MG/2ML IJ SOLN: 4.0000 mg | Freq: Four times a day (QID) | INTRAMUSCULAR | Status: DC

## 2024-01-10 MED ORDER — ONDANSETRON HCL 4 MG/2ML IJ SOLN: 4.0000 mg | Freq: Four times a day (QID) | INTRAMUSCULAR | Status: DC | PRN

## 2024-01-10 MED ORDER — SORBITOL 70 % SOLN
30.0000 mL | Freq: Once | Status: AC
  Administered 2024-01-10: 30 mL via ORAL
  Filled 2024-01-10: qty 30

## 2024-01-10 MED ORDER — DEXTROSE 5 % IV SOLN
6.0000 mg | Freq: Once | INTRAVENOUS | Status: AC
  Administered 2024-01-10: 6 mg via INTRAVENOUS
  Filled 2024-01-10: qty 4

## 2024-01-10 MED ORDER — POTASSIUM CHLORIDE CRYS ER 20 MEQ PO TBCR
40.0000 meq | EXTENDED_RELEASE_TABLET | Freq: Once | ORAL | Status: DC
  Filled 2024-01-10: qty 2

## 2024-01-10 MED ORDER — NOREPINEPHRINE 16 MG/250ML-% IV SOLN
1.0000 ug/min | INTRAVENOUS | Status: DC
  Administered 2024-01-11: 10 ug/min via INTRAVENOUS
  Administered 2024-01-13: 6 ug/min via INTRAVENOUS
  Administered 2024-01-14: 3 ug/min via INTRAVENOUS
  Administered 2024-01-16 – 2024-01-19 (×2): 4 ug/min via INTRAVENOUS
  Filled 2024-01-10 (×5): qty 250

## 2024-01-10 MED ORDER — SMOG ENEMA
960.0000 mL | Freq: Once | RECTAL | Status: AC
  Administered 2024-01-10: 960 mL via RECTAL
  Filled 2024-01-10: qty 960

## 2024-01-10 MED ORDER — ONDANSETRON HCL 4 MG/2ML IJ SOLN
INTRAMUSCULAR | Status: AC
  Administered 2024-01-10: 4 mg via INTRAVENOUS
  Filled 2024-01-10: qty 2

## 2024-01-10 MED ORDER — WARFARIN SODIUM 2.5 MG PO TABS
2.5000 mg | ORAL_TABLET | Freq: Once | ORAL | Status: AC
  Administered 2024-01-10: 2.5 mg via ORAL
  Filled 2024-01-10: qty 1

## 2024-01-10 MED ORDER — NOREPINEPHRINE 16 MG/250ML-% IV SOLN: 6.0000 ug/min | INTRAVENOUS | Status: DC

## 2024-01-10 NOTE — Progress Notes (Signed)
 Advanced Heart Failure Rounding Note  [facility] Cardiologist: Carollynn Cirri, MD Chief Complaint: Cardiogenic Shock Subjective:    Moved to ICU on 6/11 for cardiogenic shock.    Coox 71%. On Milrinone  0.25 mcg/kg/min + NE 10 3L UOP overnight on Lasix  40/hr. Remains in Afib. CXR with improving opacities.   Lying in bed. Feeling tired and weak this morning. Appears somewhat short of breath by exam. Has not been eating much.   Objective:    Vital Signs:   Temp:  [97.8 F (36.6 C)-98 F (36.7 C)] 97.8 F (36.6 C) (06/19 1215) Pulse Rate:  [81-104] 91 (06/19 1242) Resp:  [11-32] 18 (06/19 1242) BP: (69-107)/(49-93) 76/49 (06/19 1242) SpO2:  [89 %-95 %] 92 % (06/19 1242) Weight:  [83.1 kg] 83.1 kg (06/19 0405) Last BM Date : 01/05/24  Weight change: Filed Weights   01/08/24 0615 01/09/24 0600 01/10/24 0405  Weight: 82.7 kg 84.7 kg 83.1 kg   Intake/Output:  Intake/Output Summary (Last 24 hours) at 01/10/2024 1308 Last data filed at 01/10/2024 0700 Gross per 24 hour  Intake 1848.47 ml  Output 2100 ml  Net -251.53 ml    Physical Exam: CVP 8 General: Elderly, frail appearing. No distress on RA Cardiac: JVP flat. S1 and S2 present. No murmurs or rub. Extremities: Warm and dry.  1+ BLE edema.  Neuro: Alert and oriented x3. Affect pleasant. Generalized weakness.  Telemetry: Afib in 80-90s (personally reviewed)  Labs: Basic Metabolic Panel: Recent Labs  Lab 01/07/24 0348 01/07/24 1326 01/07/24 1923 01/08/24 0420 01/08/24 1304 01/08/24 2128 01/09/24 0407 01/09/24 1418 01/10/24 0403  NA 122*   < > 122* 124* 122* 123* 123* 123* 122*  K 3.6   < > 3.8 4.1 4.5 3.5 3.5 3.7 3.3*  CL 82*   < > 80* 83* 81* 80* 81* 80* 78*  CO2 28   < > 30 27 26 28 28 27 29   GLUCOSE 131*   < > 268* 157* 191* 169* 161* 139* 161*  BUN 67*   < > 60* 58* 56* 55* 52* 50* 49*  CREATININE 1.84*   < > 1.83* 1.74* 1.86* 1.80* 1.72* 1.85* 1.72*  CALCIUM 8.4*   < > 8.5* 8.5* 8.5* 8.6* 8.6* 8.8* 8.7*  MG  2.5*  --  2.5* 2.5*  --   --  2.4  --  2.3   < > = values in this interval not displayed.   Liver Function Tests: No results for input(s): AST, ALT, ALKPHOS, BILITOT, PROT, ALBUMIN in the last 168 hours.  CBC: Recent Labs  Lab 01/06/24 0302 01/07/24 0348 01/08/24 0420 01/09/24 0407 01/10/24 0403  WBC 14.3* 11.1* 13.2* 16.6* 16.0*  HGB 13.8 12.9* 13.9 14.5 14.2  HCT 42.3 40.1 42.7 44.6 43.9  MCV 62.3* 62.2* 62.3* 61.8* 61.8*  PLT 165 131* 151 167 155   Medications:    Scheduled Medications:  allopurinol   150 mg Oral Daily   aspirin  EC  81 mg Oral Daily   Chlorhexidine  Gluconate Cloth  6 each Topical Daily   dapagliflozin  propanediol  10 mg Oral Daily   fesoterodine   4 mg Oral QODAY   insulin aspart  0-15 Units Subcutaneous TID WC   insulin aspart  0-5 Units Subcutaneous QHS   polyethylene glycol  17 g Oral Daily   senna-docusate  1 tablet Oral BID   SMOG  960 mL Rectal Once   tamsulosin   0.4 mg Oral QODAY   Warfarin - Pharmacist Dosing Inpatient   Does  not apply q1600   Infusions:  amiodarone  60 mg/hr (01/10/24 1250)   milrinone  0.125 mcg/kg/min (01/10/24 0945)   norepinephrine  (LEVOPHED ) Adult infusion 6 mcg/min (01/10/24 0843)   PRN Medications: mouth rinse, traZODone   Assessment/Plan:   1. Acute on chronic systolic CHF -> cardiogenic shock:  - Coronary angiography in 1/23  mild CAD.  - Echo 01/03/24  EF 25-30% RV mod reduced - Has developed recurrent HF/shock in setting of recurrent AF - Initial LA 2.1. Co-ox 41%  - Coox 71%. CVP 8 - Wean Milrinone  to 0.125 mcg/kg/min - UOP marginal. Stop Lasix  gtt. Consider Bumex dose this evening.  - Repeat afternoon BMET - Continue NE for MAP >65. Wean to 6.  - Continue to hold entresto , toprol  XL, and spiro. - Continue Farxiga  10 mg daily.  - Repeat CV tomorrow for AF - Volume removal continued to be tedious, could consider RHC  to further eval filling pressures and function.   2. Mechanical aortic valve:  Continue warfarin and ASA 81. - INR 2.7, discussed with pharmD  3. Paroxysmal Atrial fibrillation/atrial flutter:  - Admitted 1/18 with atrial fibrillation with RVR and CHF.  EF down to 20-25%.  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   - in cardiogenic shock in setting of recurrent AF.  - Failed DC-CV on 6/13 - remains in AF, rate controlled - plan for repeat CV tomorrow.   4. Ascending aortic aneurysm: 4.3 cm on 4/24 MRA chest.   5. AKI on CKD2: Creatinine 2.14 on admit. Baseline 1.5. Suspect cardio-renal.  - Cr stable at 1.72 today, BUN improving slowly - diuresis as above - continue to support with milrinone  and NE 6 - follow BMET   6. Hyperkalemia: resolved. Replace as needed   7. Shock liver - likely 2/2 low output/hepatic congestion - improving   8. Hyponatremia/hypokalemia - limit FW, diuresis  9. ILD - CT reviewed with P/CCM - CXR today with increasing lung volume and improving, but persistent opacities  Length of Stay: 9  CRITICAL CARE Performed by: [place] Zareah Hunzeker  Total critical care time: 13 minutes  -Critical care time was exclusive of separately billable procedures and treating other patients. -Critical care was necessary to treat or prevent imminent or life-threatening deterioration. -Critical care was time spent personally by me on the following activities: development of treatment plan with patient and/or surrogate as well as nursing, discussions with consultants, evaluation of patient's response to treatment, examination of patient, obtaining history from patient or surrogate, ordering and performing treatments and interventions, ordering and review of laboratory studies, ordering and review of radiographic studies, pulse oximetry and re-evaluation of patient's condition.  [place] Judi Jaffe, NP 01/10/2024, 1:08 PM  Advanced Heart Failure Team Pager [phone] (M-F; 7a - 4p). For after hours, see Amion for on call HF MD

## 2024-01-10 NOTE — Progress Notes (Signed)
 No urine output one hour post Bumex administration. No urge to urinate.

## 2024-01-10 NOTE — Progress Notes (Addendum)
 ANTICOAGULATION CONSULT NOTE  Pharmacy Consult for Warfarin Indication: afib with mechanical AVR  Allergies  Allergen Reactions   Celecoxib Rash    Other Reaction(s): Unknown    Patient Measurements: Height: 5' 10 (177.8 cm) Weight: 83.1 kg (183 lb 3.2 oz) IBW/kg (Calculated) : 73  Vital Signs: Temp: 97.8 F (36.6 C) (06/19 1215) Temp Source: Oral (06/19 1215) BP: 76/49 (06/19 1242) Pulse Rate: 91 (06/19 1242)  Labs: Recent Labs    01/08/24 0420 01/08/24 1304 01/09/24 0407 01/09/24 1418 01/10/24 0403  HGB 13.9  --  14.5  --  14.2  HCT 42.7  --  44.6  --  43.9  PLT 151  --  167  --  155  LABPROT 26.5*  --  29.0*  --  29.2*  INR 2.4*  --  2.7*  --  2.7*  CREATININE 1.74*   < > 1.72* 1.85* 1.72*   < > = values in this interval not displayed.    Estimated Creatinine Clearance: 34.2 mL/min (A) (by C-G formula based on SCr of 1.72 mg/dL (H)).   Medical History: Past Medical History:  Diagnosis Date   Anticoagulant long-term use    Arthritis    probably in my thumbs (06/26/2012)   Bifascicular block    Chronic combined systolic and diastolic CHF (congestive heart failure) (HCC)    a. 01/2016 Echo: EF 45-50%, gr2 DD, inflat AK (poor acoustic windows);  b. 07/2016 Echo: EF 25-30%, mild LVH, PASP (pt in Afib).   Complication of anesthesia    wake up w/a start; hallucinations (06/26/2012)   Critical Aortic Stenosis    a. 03/2003 s/p SJM #25 mech prosthetic AoV;  b. 07/2016 Echo: EF 25-30% (in setting of AF), AoV area 1.72 cm^2 (VTI), 1.75 cm^2 (Vmean).   Diverticulosis    Gouty arthritis    have had it in both feet, ankles, right knee (06/26/2012)   History of diverticulitis of colon    History of kidney stones    History of pancreatitis    Hypertension    Incomplete RBBB    PAF (paroxysmal atrial fibrillation) (HCC)    a. CHA2DS2VASc = 4-->chronic coumadin  in setting of mech AoV;  b. 07/2016 Recurrent AF RVR.    Medications:  Medications Prior to  Admission  Medication Sig Dispense Refill Last Dose/Taking   acetaminophen  (TYLENOL ) 500 MG tablet Take 500 mg by mouth 2 (two) times daily.   01/01/2024   albuterol  (VENTOLIN  HFA) 108 (90 Base) MCG/ACT inhaler Inhale 2 puffs into the lungs every 4 (four) hours.   01/01/2024   allopurinol  (ZYLOPRIM ) 300 MG tablet Take 150 mg by mouth daily.   01/01/2024   aspirin  EC 81 MG tablet Take 81 mg by mouth daily. Swallow whole.   01/01/2024   clobetasol cream (TEMOVATE) 0.05 % Apply 1 Application topically 2 (two) times daily.   01/01/2024   colchicine  0.6 MG tablet Take 1 tablet (0.6 mg total) by mouth daily as needed (for gout).   Unknown   FARXIGA  10 MG TABS tablet TAKE 1 TABLET BY MOUTH EVERY DAY 30 tablet 6 01/01/2024   FIBER PO Take 2 capsules by mouth 2 (two) times daily.   01/01/2024   folic acid  (FOLVITE ) 1 MG tablet TAKE 1 TABLET(1 MG) BY MOUTH DAILY 90 tablet 3 01/01/2024   metoprolol  succinate (TOPROL -XL) 25 MG 24 hr tablet TAKE 1 TABLET BY MOUTH EVERYDAY AT BEDTIME 90 tablet 1 12/31/2023   potassium chloride  SA (KLOR-CON  M) 20 MEQ tablet  Take 1 tablet (20 mEq total) by mouth daily.   01/01/2024   tamsulosin  (FLOMAX ) 0.4 MG CAPS capsule Take 1 capsule (0.4 mg total) by mouth every other day. 90 capsule 3 01/01/2024   tolterodine  (DETROL  LA) 4 MG 24 hr capsule Take 1 capsule (4 mg total) by mouth every other day. Alternating days with the tamsulosin  90 capsule 3 12/31/2023   torsemide  (DEMADEX ) 20 MG tablet Take 3 tablets (60 mg total) by mouth daily.   01/01/2024   warfarin (COUMADIN ) 5 MG tablet Take 0.5-1 tablets (2.5-5 mg total) by mouth See admin instructions. 1/19 and 1/20  take 7.5mg  (1 and 1/2 tab) then take 1 tab (5mg ) daily until seen by MD  Take 1 tablet (5 mg) by mouth on Sundays, Mondays, Wednesdays, Thursdays & Saturdays in the evening. Take 0.5 tablet (2.5 mg) by mouth on Tuesdays & Fridays in the evening. (Patient taking differently: Take 2.5-5 mg by mouth See admin instructions. AS directed by  PCP 2.5 mg Daily) 45 tablet 11 12/31/2023 at  8:00 PM   docusate sodium  (COLACE) 100 MG capsule Take 100 mg by mouth 2 (two) times daily. (Patient not taking: Reported on 01/01/2024)   Not Taking   polyethylene glycol powder (GLYCOLAX /MIRALAX ) 17 GM/SCOOP powder Take 17 g by mouth 2 (two) times daily as needed for moderate constipation or mild constipation. (Patient not taking: Reported on 12/26/2023) 238 g 2    Scheduled:   allopurinol   150 mg Oral Daily   aspirin  EC  81 mg Oral Daily   Chlorhexidine  Gluconate Cloth  6 each Topical Daily   dapagliflozin  propanediol  10 mg Oral Daily   fesoterodine   4 mg Oral QODAY   insulin aspart  0-15 Units Subcutaneous TID WC   insulin aspart  0-5 Units Subcutaneous QHS   polyethylene glycol  17 g Oral Daily   senna-docusate  1 tablet Oral BID   SMOG  960 mL Rectal Once   tamsulosin   0.4 mg Oral QODAY   warfarin  2.5 mg Oral ONCE-1600   Warfarin - Pharmacist Dosing Inpatient   Does not apply q1600   Infusions:   amiodarone  60 mg/hr (01/10/24 1250)   milrinone  0.125 mcg/kg/min (01/10/24 0945)   norepinephrine  (LEVOPHED ) Adult infusion 6 mcg/min (01/10/24 0843)   PRN: mouth rinse, traZODone   Assessment: 83 yo male presenting with worsening shortness of breath. On warfarin PTA for afib with mechanical AVR.    Per patient, dose is 5 mg daily, poor historian. Upon chart review patient should be on 2.5 mg once daily on Monday, Tuesday, Thursday, Friday, and Sunday and 1 mg on Wednesday and Saturday per last PCP note on 12/18/2023. Goal INR is 2.5-3.5.   INR today is therapeutic at 2.7. Hgb 14.2, plt stable 155.  Pertinent DDI's - amio IV @60  (none PTA, 6/12 > ), allopurinol  (PTA).  Only eating ~25% of any documented meals.  Goal of Therapy:  INR 2.5-3.5 Monitor platelets by anticoagulation protocol: Yes   Plan:  Warfarin 2.5 mg tonight (as per PTA regimen) Monitor for s/sx of bleeding, daily INR, CBC Watch for new DDIs  Thank you for allowing  pharmacy to participate in this patient's care,  Cecillia Cogan, PharmD Clinical Pharmacist 01/10/2024  1:17 PM

## 2024-01-11 ENCOUNTER — Inpatient Hospital Stay (HOSPITAL_COMMUNITY)

## 2024-01-11 ENCOUNTER — Encounter (HOSPITAL_COMMUNITY)
Admission: EM | Disposition: A | Discharge status: Hospice | Payer: Self-pay | Source: Home / Self Care | Attending: Cardiology

## 2024-01-11 DIAGNOSIS — R570 Cardiogenic shock: Secondary | ICD-10-CM | POA: Diagnosis not present

## 2024-01-11 LAB — COOXEMETRY PANEL
Carboxyhemoglobin: 2.1 % (ref 0.5–1.5)
Carboxyhemoglobin: 2.2 % — ABNORMAL HIGH (ref 0.5–1.5)
Methemoglobin: 0.7 % (ref 0.0–1.5)
Methemoglobin: 0.7 % (ref 0.0–1.5)
O2 Saturation: 71 %
O2 Saturation: 67.3 %
Total hemoglobin: 15.3 g/dL (ref 12.0–16.0)
Total hemoglobin: 14 g/dL (ref 12.0–16.0)

## 2024-01-11 LAB — URINALYSIS, ROUTINE W REFLEX MICROSCOPIC
Bacteria, UA: NONE SEEN
Bilirubin Urine: NEGATIVE
Glucose, UA: >=500 mg/dL — AB
Hgb urine dipstick: NEGATIVE
Ketones, ur: NEGATIVE mg/dL
Leukocytes,Ua: NEGATIVE
Nitrite: NEGATIVE
Protein, ur: NEGATIVE mg/dL
Specific Gravity, Urine: 1.009 (ref 1.005–1.030)
pH: 5 (ref 5.0–8.0)

## 2024-01-11 LAB — MAGNESIUM: Magnesium: 2.6 mg/dL — ABNORMAL HIGH (ref 1.7–2.4)

## 2024-01-11 LAB — BASIC METABOLIC PANEL WITH GFR
Anion gap: 15 (ref 5–15)
Anion gap: 15 (ref 5–15)
BUN: 53 mg/dL — ABNORMAL HIGH (ref 8–23)
BUN: 55 mg/dL — ABNORMAL HIGH (ref 8–23)
CO2: 29 mmol/L (ref 22–32)
CO2: 29 mmol/L (ref 22–32)
Calcium: 8.9 mg/dL (ref 8.9–10.3)
Calcium: 8.9 mg/dL (ref 8.9–10.3)
Chloride: 78 mmol/L — ABNORMAL LOW (ref 98–111)
Chloride: 77 mmol/L — ABNORMAL LOW (ref 98–111)
Creatinine, Ser: 2.06 mg/dL — ABNORMAL HIGH (ref 0.61–1.24)
Creatinine, Ser: 2.2 mg/dL — ABNORMAL HIGH (ref 0.61–1.24)
GFR, Estimated: 32 mL/min — ABNORMAL LOW (ref >60)
GFR, Estimated: 29 mL/min — ABNORMAL LOW (ref >60)
Glucose, Bld: 142 mg/dL — ABNORMAL HIGH (ref 70–99)
Glucose, Bld: 168 mg/dL — ABNORMAL HIGH (ref 70–99)
Potassium: 4.1 mmol/L (ref 3.5–5.1)
Potassium: 4.1 mmol/L (ref 3.5–5.1)
Sodium: 122 mmol/L — ABNORMAL LOW (ref 135–145)
Sodium: 121 mmol/L — ABNORMAL LOW (ref 135–145)

## 2024-01-11 LAB — CBC
HCT: 44.3 % (ref 39.0–52.0)
Hemoglobin: 14.5 g/dL (ref 13.0–17.0)
MCH: 20.3 pg — ABNORMAL LOW (ref 26.0–34.0)
MCHC: 32.7 g/dL (ref 30.0–36.0)
MCV: 61.9 fL — ABNORMAL LOW (ref 80.0–100.0)
Platelets: 174 10*3/uL (ref 150–400)
RBC: 7.16 MIL/uL — ABNORMAL HIGH (ref 4.22–5.81)
RDW: 19.6 % — ABNORMAL HIGH (ref 11.5–15.5)
WBC: 16 10*3/uL — ABNORMAL HIGH (ref 4.0–10.5)
nRBC: 0.7 % — ABNORMAL HIGH (ref 0.0–0.2)

## 2024-01-11 LAB — GLUCOSE, CAPILLARY
Glucose-Capillary: 205 mg/dL — ABNORMAL HIGH (ref 70–99)
Glucose-Capillary: 164 mg/dL — ABNORMAL HIGH (ref 70–99)
Glucose-Capillary: 111 mg/dL — ABNORMAL HIGH (ref 70–99)
Glucose-Capillary: 279 mg/dL — ABNORMAL HIGH (ref 70–99)
Glucose-Capillary: 109 mg/dL — ABNORMAL HIGH (ref 70–99)

## 2024-01-11 LAB — PROTIME-INR
INR: 2.8 — ABNORMAL HIGH (ref 0.8–1.2)
Prothrombin Time: 29.4 s — ABNORMAL HIGH (ref 11.4–15.2)

## 2024-01-11 LAB — HEPATIC FUNCTION PANEL
ALT: 36 U/L (ref 0–44)
AST: 34 U/L (ref 15–41)
Albumin: 3.3 g/dL — ABNORMAL LOW (ref 3.5–5.0)
Alkaline Phosphatase: 103 U/L (ref 38–126)
Bilirubin, Direct: 0.9 mg/dL — ABNORMAL HIGH (ref 0.0–0.2)
Indirect Bilirubin: 1.4 mg/dL — ABNORMAL HIGH (ref 0.3–0.9)
Total Bilirubin: 2.3 mg/dL — ABNORMAL HIGH (ref 0.0–1.2)
Total Protein: 6 g/dL — ABNORMAL LOW (ref 6.5–8.1)

## 2024-01-11 SURGERY — CARDIOVERSION (CATH LAB)
Anesthesia: General

## 2024-01-11 MED ORDER — WARFARIN SODIUM 2.5 MG PO TABS
2.5000 mg | ORAL_TABLET | Freq: Once | ORAL | Status: AC
  Administered 2024-01-11: 2.5 mg via ORAL
  Filled 2024-01-11: qty 1

## 2024-01-11 MED ORDER — SODIUM CHLORIDE 0.9% FLUSH: 3.0000 mL | INTRAVENOUS | Status: DC | PRN

## 2024-01-11 MED ORDER — SODIUM CHLORIDE 0.9% FLUSH: 3.0000 mL | Freq: Two times a day (BID) | INTRAVENOUS | Status: DC

## 2024-01-11 MED ORDER — SORBITOL 70 % SOLN
30.0000 mL | Freq: Once | Status: AC
  Administered 2024-01-11: 30 mL via ORAL
  Filled 2024-01-11: qty 30

## 2024-01-11 MED ORDER — ENSURE PLUS HIGH PROTEIN PO LIQD
237.0000 mL | Freq: Two times a day (BID) | ORAL | Status: DC
  Administered 2024-01-11 – 2024-01-23 (×21): 237 mL via ORAL

## 2024-01-11 MED ORDER — AMIODARONE HCL 200 MG PO TABS
200.0000 mg | ORAL_TABLET | Freq: Every day | ORAL | Status: DC
  Administered 2024-01-11 – 2024-01-13 (×3): 200 mg via ORAL
  Filled 2024-01-11 (×3): qty 1

## 2024-01-11 MED ORDER — VASOPRESSIN 20 UNITS/100 ML INFUSION FOR SHOCK
0.0000 [IU]/min | INTRAVENOUS | Status: DC
  Administered 2024-01-11 – 2024-01-18 (×17): 0.03 [IU]/min via INTRAVENOUS
  Filled 2024-01-11 (×19): qty 100

## 2024-01-11 NOTE — Progress Notes (Signed)
   Inpatient Rehabilitation Admissions Coordinator   We will follow up next week with patient's medical progress to assist with planning dispo when appropriate.  Jeannetta Millman, RN, MSN Rehab Admissions Coordinator [phone] 01/11/2024 12:11 PM

## 2024-01-11 NOTE — Progress Notes (Signed)
 Advanced Heart Failure Rounding Note  [facility] Cardiologist: Carollynn Cirri, MD Chief Complaint: Cardiogenic Shock Subjective:    Moved to ICU on 6/11 for cardiogenic shock.   6/19 Lasix  drip stopped. Norepi cut back 6 mcg.   Coox 67% . On Milrinone  0.125 mcg/kg/min + NE 6    Denies SOB.  Objective:    Vital Signs:   Temp:  [97.6 F (36.4 C)-97.9 F (36.6 C)] 97.9 F (36.6 C) (06/20 0700) Pulse Rate:  [75-116] 81 (06/20 0745) Resp:  [12-28] 19 (06/20 0745) BP: (69-107)/(49-93) 81/61 (06/20 0745) SpO2:  [82 %-95 %] 89 % (06/20 0745) Weight:  [82.8 kg] 82.8 kg (06/20 0428) Last BM Date : 01/10/24  Weight change: Filed Weights   01/09/24 0600 01/10/24 0405 01/11/24 0428  Weight: 84.7 kg 83.1 kg 82.8 kg   Intake/Output:  Intake/Output Summary (Last 24 hours) at 01/11/2024 0800 Last data filed at 01/11/2024 0700 Gross per 24 hour  Intake 1514.09 ml  Output 800 ml  Net 714.09 ml   CVP 12-13.  Physical Exam: General:   No resp difficulty Neck: supple. No 11-12  Cor: PMI nondisplaced. Irregular rate & rhythm. No rubs, gallops or murmurs. Lungs: clear Abdomen: soft, nontender, nondistended.  Extremities: no cyanosis, clubbing, rash, R and LLE 1+ edema Neuro: alert & oriented x3  Telemetry:  A fib 80-90s Labs: Basic Metabolic Panel: Recent Labs  Lab 01/07/24 1923 01/08/24 0420 01/08/24 1304 01/09/24 0407 01/09/24 1418 01/10/24 0403 01/10/24 1416 01/11/24 0406  NA 122* 124*   < > 123* 123* 122* 123* 122*  K 3.8 4.1   < > 3.5 3.7 3.3* 3.9 4.1  CL 80* 83*   < > 81* 80* 78* 78* 78*  CO2 30 27   < > 28 27 29  32 29  GLUCOSE 268* 157*   < > 161* 139* 161* 112* 142*  BUN 60* 58*   < > 52* 50* 49* 50* 53*  CREATININE 1.83* 1.74*   < > 1.72* 1.85* 1.72* 1.88* 2.06*  CALCIUM 8.5* 8.5*   < > 8.6* 8.8* 8.7* 8.9 8.9  MG 2.5* 2.5*  --  2.4  --  2.3  --  2.6*   < > = values in this interval not displayed.   Liver Function Tests: No results for input(s): AST, ALT,  ALKPHOS, BILITOT, PROT, ALBUMIN in the last 168 hours.  CBC: Recent Labs  Lab 01/07/24 0348 01/08/24 0420 01/09/24 0407 01/10/24 0403 01/11/24 0406  WBC 11.1* 13.2* 16.6* 16.0* 16.0*  HGB 12.9* 13.9 14.5 14.2 14.5  HCT 40.1 42.7 44.6 43.9 44.3  MCV 62.2* 62.3* 61.8* 61.8* 61.9*  PLT 131* 151 167 155 174   Medications:    Scheduled Medications:  allopurinol   150 mg Oral Daily   aspirin  EC  81 mg Oral Daily   Chlorhexidine  Gluconate Cloth  6 each Topical Daily   dapagliflozin  propanediol  10 mg Oral Daily   fesoterodine   4 mg Oral QODAY   insulin aspart  0-15 Units Subcutaneous TID WC   insulin aspart  0-5 Units Subcutaneous QHS   polyethylene glycol  17 g Oral Daily   senna-docusate  1 tablet Oral BID   tamsulosin   0.4 mg Oral QODAY   Warfarin - Pharmacist Dosing Inpatient   Does not apply q1600   Infusions:  amiodarone  60 mg/hr (01/11/24 0728)   milrinone  0.125 mcg/kg/min (01/11/24 0700)   norepinephrine  (LEVOPHED ) Adult infusion 6 mcg/min (01/11/24 0700)   PRN Medications: ondansetron  (ZOFRAN ) IV,  mouth rinse, traZODone   Assessment/Plan:   1. Acute on chronic systolic CHF -> cardiogenic shock:  - Coronary angiography in 1/23  mild CAD.  - Echo 01/03/24  EF 25-30% RV mod reduced - Has developed recurrent HF/shock in setting of recurrent AF - Initial LA 2.1. Co-ox 41%  - Coox 67% CVP `12-13  - Continue Milrinone  to 0.125 mcg/kg/min. Wean norepi to maintaing MAP >65 .  -  Continue NE for MAP >65.  - Continue to hold entresto , toprol  XL, and spiro. - Continue Farxiga  10 mg daily.  - Repeat Cardioversion today.    2. Mechanical aortic valve: Continue warfarin and ASA 81. - INR 2.8, discussed with pharmD  3. Paroxysmal Atrial fibrillation/atrial flutter:  - Admitted 1/18 with atrial fibrillation with RVR and CHF.  EF down to 20-25%.  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   - in cardiogenic shock in setting of recurrent AF.  -  Failed DC-CV on 6/13 - remains in AF, rate controlled. Cut back amio 30 mg per hour. Can stop once Norepi off. Plan Cardioversion today    4. Ascending aortic aneurysm: 4.3 cm on 4/24 MRA chest.   5. AKI on CKD2: Creatinine 2.14 on admit. Baseline 1.5. Suspect cardio-renal.  - Cr trending up 1.7>2.06 , BUN 53 - diuresis as above - continue to support with milrinone  0.125 mcg + NE 6   6. Hyperkalemia: resolved. Replace as needed   7. Shock liver - likely 2/2 low output/hepatic congestion - improving   8. Hyponatremia - limit FW, diuresis - Consider Tolvaptan.   9. ILD - CT reviewed with P/CCM   Length of Stay: 10  CRITICAL CARE Performed by: [place] Lee  Total critical care time: 114 minutes  -Critical care time was exclusive of separately billable procedures and treating other patients. -Critical care was necessary to treat or prevent imminent or life-threatening deterioration. -Critical care was time spent personally by me on the following activities: development of treatment plan with patient and/or surrogate as well as nursing, discussions with consultants, evaluation of patient's response to treatment, examination of patient, obtaining history from patient or surrogate, ordering and performing treatments and interventions, ordering and review of laboratory studies, ordering and review of radiographic studies, pulse oximetry and re-evaluation of patient's condition.  Nieves Bars, NP 01/11/2024, 8:00 AM  Advanced Heart Failure Team Pager [phone] (M-F; 7a - 4p). For after hours, see Amion for on call HF MD

## 2024-01-11 NOTE — Anesthesia Preprocedure Evaluation (Signed)
 Anesthesia Evaluation  Patient identified by MRN, date of birth, ID band Patient awake    Reviewed: Allergy & Precautions, NPO status , Patient's Chart, lab work & pertinent test results  History of Anesthesia Complications (+) Emergence Delirium and history of anesthetic complications  Airway Mallampati: I  TM Distance: >3 FB Neck ROM: Full    Dental no notable dental hx. (+) Teeth Intact, Dental Advisory Given   Pulmonary neg pulmonary ROS   Pulmonary exam normal breath sounds clear to auscultation       Cardiovascular hypertension, Pt. on medications and Pt. on home beta blockers +CHF  + dysrhythmias Atrial Fibrillation  Rhythm:Regular Rate:Normal     Neuro/Psych negative neurological ROS  negative psych ROS   GI/Hepatic Neg liver ROS,GERD  ,,  Endo/Other  negative endocrine ROS    Renal/GU negative Renal ROS  negative genitourinary   Musculoskeletal  (+) Arthritis , Osteoarthritis,    Abdominal   Peds negative pediatric ROS (+)  Hematology negative hematology ROS (+)   Anesthesia Other Findings   Reproductive/Obstetrics negative OB ROS                              Anesthesia Physical Anesthesia Plan  ASA: 3  Anesthesia Plan: General   Post-op Pain Management: Minimal or no pain anticipated   Induction: Intravenous  PONV Risk Score and Plan: 1 and Propofol  infusion and Treatment may vary due to age or medical condition  Airway Management Planned: Simple Face Mask, Natural Airway and Mask  Additional Equipment: None  Intra-op Plan:   Post-operative Plan: Extubation in OR  Informed Consent: I have reviewed the patients History and Physical, chart, labs and discussed the procedure including the risks, benefits and alternatives for the proposed anesthesia with the patient or authorized representative who has indicated his/her understanding and acceptance.     Dental  advisory given  Plan Discussed with: CRNA and Anesthesiologist  Anesthesia Plan Comments: ( ECHO 1/23  Left ventricular ejection fraction, by estimation, is 35 to 40%. The left ventricle has moderately decreased function. The left ventricle demonstrates global hypokinesis. Left ventricular diastolic parameters are indeterminate. 1. Right ventricular systolic function is mildly reduced. The right ventricular size is normal. There is moderately elevated pulmonary artery systolic pressure. The estimated right ventricular systolic pressure is 50.8 mmHg. 2. 3. Left atrial size was moderately dilated. The mitral valve is abnormal. Moderate mitral valve regurgitation. No evidence of mitral stenosis. 4. Mechanical aortic valve. Mean gradient 10 mmHg, no significant stenosis. No significant regurgitation. The aortic valve has been repaired/replaced. 5. Aortic dilatation noted. There is mild dilatation of the ascending aorta, measuring 41 mm. 6. 7. Right atrial size was mildly dilated. The inferior vena cava is dilated in size  CATH 1/23     Prox RCA lesion is 30% stenosed.   1. Mildly elevated PCWP with mild pulmonary venous hypertension.  2. Preserved cardiac output.  3. Minimal CAD.  4. Tortuous right subclavian, difficult to access RCA, consider femoral access if future coronary angiography needed.    Nonischemic cardiomyopathy.  Would keep diuretic the same as prior to procedure.  He will need to resume Lovenox  bridge to therapeutic warfarin.   )         Anesthesia Quick Evaluation

## 2024-01-11 NOTE — Progress Notes (Signed)
 ANTICOAGULATION CONSULT NOTE  Pharmacy Consult for Warfarin Indication: afib with mechanical AVR  Allergies  Allergen Reactions   Celecoxib Rash    Other Reaction(s): Unknown    Patient Measurements: Height: 5' 10 (177.8 cm) Weight: 82.8 kg (182 lb 8.7 oz) IBW/kg (Calculated) : 73  Vital Signs: Temp: 97.9 F (36.6 C) (06/20 1130) Temp Source: Oral (06/20 1130) BP: 77/65 (06/20 1400) Pulse Rate: 93 (06/20 1400)  Labs: Recent Labs    01/09/24 0407 01/09/24 1418 01/10/24 0403 01/10/24 1416 01/11/24 0406 01/11/24 1205  HGB 14.5  --  14.2  --  14.5  --   HCT 44.6  --  43.9  --  44.3  --   PLT 167  --  155  --  174  --   LABPROT 29.0*  --  29.2*  --  29.4*  --   INR 2.7*  --  2.7*  --  2.8*  --   CREATININE 1.72*   < > 1.72* 1.88* 2.06* 2.20*   < > = values in this interval not displayed.    Estimated Creatinine Clearance: 26.7 mL/min (A) (by C-G formula based on SCr of 2.2 mg/dL (H)).   Medical History: Past Medical History:  Diagnosis Date   Anticoagulant long-term use    Arthritis    probably in my thumbs (06/26/2012)   Bifascicular block    Chronic combined systolic and diastolic CHF (congestive heart failure) (HCC)    a. 01/2016 Echo: EF 45-50%, gr2 DD, inflat AK (poor acoustic windows);  b. 07/2016 Echo: EF 25-30%, mild LVH, PASP (pt in Afib).   Complication of anesthesia    wake up w/a start; hallucinations (06/26/2012)   Critical Aortic Stenosis    a. 03/2003 s/p SJM #25 mech prosthetic AoV;  b. 07/2016 Echo: EF 25-30% (in setting of AF), AoV area 1.72 cm^2 (VTI), 1.75 cm^2 (Vmean).   Diverticulosis    Gouty arthritis    have had it in both feet, ankles, right knee (06/26/2012)   History of diverticulitis of colon    History of kidney stones    History of pancreatitis    Hypertension    Incomplete RBBB    PAF (paroxysmal atrial fibrillation) (HCC)    a. CHA2DS2VASc = 4-->chronic coumadin  in setting of mech AoV;  b. 07/2016 Recurrent AF RVR.     Medications:  Medications Prior to Admission  Medication Sig Dispense Refill Last Dose/Taking   acetaminophen  (TYLENOL ) 500 MG tablet Take 500 mg by mouth 2 (two) times daily.   01/01/2024   albuterol  (VENTOLIN  HFA) 108 (90 Base) MCG/ACT inhaler Inhale 2 puffs into the lungs every 4 (four) hours.   01/01/2024   allopurinol  (ZYLOPRIM ) 300 MG tablet Take 150 mg by mouth daily.   01/01/2024   aspirin  EC 81 MG tablet Take 81 mg by mouth daily. Swallow whole.   01/01/2024   clobetasol cream (TEMOVATE) 0.05 % Apply 1 Application topically 2 (two) times daily.   01/01/2024   colchicine  0.6 MG tablet Take 1 tablet (0.6 mg total) by mouth daily as needed (for gout).   Unknown   FARXIGA  10 MG TABS tablet TAKE 1 TABLET BY MOUTH EVERY DAY 30 tablet 6 01/01/2024   FIBER PO Take 2 capsules by mouth 2 (two) times daily.   01/01/2024   folic acid  (FOLVITE ) 1 MG tablet TAKE 1 TABLET(1 MG) BY MOUTH DAILY 90 tablet 3 01/01/2024   metoprolol  succinate (TOPROL -XL) 25 MG 24 hr tablet TAKE 1 TABLET BY  MOUTH EVERYDAY AT BEDTIME 90 tablet 1 12/31/2023   potassium chloride  SA (KLOR-CON  M) 20 MEQ tablet Take 1 tablet (20 mEq total) by mouth daily.   01/01/2024   tamsulosin  (FLOMAX ) 0.4 MG CAPS capsule Take 1 capsule (0.4 mg total) by mouth every other day. 90 capsule 3 01/01/2024   tolterodine  (DETROL  LA) 4 MG 24 hr capsule Take 1 capsule (4 mg total) by mouth every other day. Alternating days with the tamsulosin  90 capsule 3 12/31/2023   torsemide  (DEMADEX ) 20 MG tablet Take 3 tablets (60 mg total) by mouth daily.   01/01/2024   warfarin (COUMADIN ) 5 MG tablet Take 0.5-1 tablets (2.5-5 mg total) by mouth See admin instructions. 1/19 and 1/20  take 7.5mg  (1 and 1/2 tab) then take 1 tab (5mg ) daily until seen by MD  Take 1 tablet (5 mg) by mouth on Sundays, Mondays, Wednesdays, Thursdays & Saturdays in the evening. Take 0.5 tablet (2.5 mg) by mouth on Tuesdays & Fridays in the evening. (Patient taking differently: Take 2.5-5 mg by  mouth See admin instructions. AS directed by PCP 2.5 mg Daily) 45 tablet 11 12/31/2023 at  8:00 PM   docusate sodium  (COLACE) 100 MG capsule Take 100 mg by mouth 2 (two) times daily. (Patient not taking: Reported on 01/01/2024)   Not Taking   polyethylene glycol powder (GLYCOLAX /MIRALAX ) 17 GM/SCOOP powder Take 17 g by mouth 2 (two) times daily as needed for moderate constipation or mild constipation. (Patient not taking: Reported on 12/26/2023) 238 g 2    Scheduled:   allopurinol   150 mg Oral Daily   amiodarone   200 mg Oral Daily   aspirin  EC  81 mg Oral Daily   Chlorhexidine  Gluconate Cloth  6 each Topical Daily   dapagliflozin  propanediol  10 mg Oral Daily   feeding supplement  237 mL Oral BID BM   fesoterodine   4 mg Oral QODAY   insulin aspart  0-15 Units Subcutaneous TID WC   insulin aspart  0-5 Units Subcutaneous QHS   polyethylene glycol  17 g Oral Daily   senna-docusate  1 tablet Oral BID   sorbitol  30 mL Oral Once   tamsulosin   0.4 mg Oral QODAY   Warfarin - Pharmacist Dosing Inpatient   Does not apply q1600   Infusions:   norepinephrine  (LEVOPHED ) Adult infusion 12 mcg/min (01/11/24 1400)   vasopressin 0.03 Units/min (01/11/24 1400)   PRN: ondansetron  (ZOFRAN ) IV, mouth rinse, traZODone   Assessment: 83 yo male presenting with worsening shortness of breath. On warfarin PTA for afib with mechanical AVR.    Per patient, dose is 5 mg daily, poor historian. Upon chart review patient should be on 2.5 mg once daily on Monday, Tuesday, Thursday, Friday, and Sunday and 1 mg on Wednesday and Saturday per last PCP note on 12/18/2023. Goal INR is 2.5-3.5.   INR today is therapeutic at 2.8. Hgb 14.5, plt 174.  Pertinent DDI's - amio IV @60  (none PTA, 6/12 >20) PO amio (6/20 >), allopurinol  (PTA).   Goal of Therapy:  INR 2.5-3.5 Monitor platelets by anticoagulation protocol: Yes   Plan:  Warfarin 2.5 mg tonight (as per PTA regimen) Monitor for s/sx of bleeding, daily INR, CBC Watch for  new DDIs  Thank you for allowing pharmacy to participate in this patient's care,  Cecillia Cogan, PharmD Clinical Pharmacist 01/11/2024  2:47 PM

## 2024-01-12 DIAGNOSIS — R570 Cardiogenic shock: Secondary | ICD-10-CM | POA: Diagnosis not present

## 2024-01-12 LAB — COOXEMETRY PANEL
Carboxyhemoglobin: 1.9 % — ABNORMAL HIGH (ref 0.5–1.5)
Methemoglobin: <0.7 % (ref 0.0–1.5)
O2 Saturation: 55.8 %
Total hemoglobin: 14.9 g/dL (ref 12.0–16.0)

## 2024-01-12 LAB — BASIC METABOLIC PANEL WITH GFR
Anion gap: 14 (ref 5–15)
Anion gap: 13 (ref 5–15)
BUN: 63 mg/dL — ABNORMAL HIGH (ref 8–23)
BUN: 66 mg/dL — ABNORMAL HIGH (ref 8–23)
CO2: 29 mmol/L (ref 22–32)
CO2: 31 mmol/L (ref 22–32)
Calcium: 8.9 mg/dL (ref 8.9–10.3)
Calcium: 8.9 mg/dL (ref 8.9–10.3)
Chloride: 77 mmol/L — ABNORMAL LOW (ref 98–111)
Chloride: 80 mmol/L — ABNORMAL LOW (ref 98–111)
Creatinine, Ser: 2.27 mg/dL — ABNORMAL HIGH (ref 0.61–1.24)
Creatinine, Ser: 2.33 mg/dL — ABNORMAL HIGH (ref 0.61–1.24)
GFR, Estimated: 28 mL/min — ABNORMAL LOW (ref >60)
GFR, Estimated: 27 mL/min — ABNORMAL LOW (ref >60)
Glucose, Bld: 118 mg/dL — ABNORMAL HIGH (ref 70–99)
Glucose, Bld: 121 mg/dL — ABNORMAL HIGH (ref 70–99)
Potassium: 4 mmol/L (ref 3.5–5.1)
Potassium: 3.1 mmol/L — ABNORMAL LOW (ref 3.5–5.1)
Sodium: 120 mmol/L — ABNORMAL LOW (ref 135–145)
Sodium: 124 mmol/L — ABNORMAL LOW (ref 135–145)

## 2024-01-12 LAB — CBC
HCT: 45 % (ref 39.0–52.0)
Hemoglobin: 14.5 g/dL (ref 13.0–17.0)
MCH: 19.9 pg — ABNORMAL LOW (ref 26.0–34.0)
MCHC: 32.2 g/dL (ref 30.0–36.0)
MCV: 61.6 fL — ABNORMAL LOW (ref 80.0–100.0)
Platelets: 182 10*3/uL (ref 150–400)
RBC: 7.3 MIL/uL — ABNORMAL HIGH (ref 4.22–5.81)
RDW: 19.7 % — ABNORMAL HIGH (ref 11.5–15.5)
WBC: 15.2 10*3/uL — ABNORMAL HIGH (ref 4.0–10.5)
nRBC: 1.2 % — ABNORMAL HIGH (ref 0.0–0.2)

## 2024-01-12 LAB — GLUCOSE, CAPILLARY
Glucose-Capillary: 138 mg/dL — ABNORMAL HIGH (ref 70–99)
Glucose-Capillary: 211 mg/dL — ABNORMAL HIGH (ref 70–99)
Glucose-Capillary: 222 mg/dL — ABNORMAL HIGH (ref 70–99)
Glucose-Capillary: 87 mg/dL (ref 70–99)

## 2024-01-12 LAB — PROTIME-INR
INR: 2.6 — ABNORMAL HIGH (ref 0.8–1.2)
Prothrombin Time: 28.1 s — ABNORMAL HIGH (ref 11.4–15.2)

## 2024-01-12 MED ORDER — MAGNESIUM SULFATE 2 GM/50ML IV SOLN: 2.0000 g | Freq: Once | INTRAVENOUS | Status: DC

## 2024-01-12 MED ORDER — FUROSEMIDE 10 MG/ML IJ SOLN
160.0000 mg | Freq: Once | INTRAVENOUS | Status: AC
  Administered 2024-01-12: 160 mg via INTRAVENOUS
  Filled 2024-01-12: qty 10

## 2024-01-12 MED ORDER — SODIUM CHLORIDE 3 % IV BOLUS
150.0000 mL | Freq: Two times a day (BID) | INTRAVENOUS | Status: AC
  Administered 2024-01-12 (×2): 150 mL via INTRAVENOUS
  Filled 2024-01-12 (×2): qty 500

## 2024-01-12 MED ORDER — MILRINONE LACTATE IN DEXTROSE 20-5 MG/100ML-% IV SOLN
0.2500 ug/kg/min | INTRAVENOUS | Status: DC
  Administered 2024-01-12 – 2024-01-22 (×9): 0.125 ug/kg/min via INTRAVENOUS
  Administered 2024-01-23 – 2024-01-24 (×3): 0.25 ug/kg/min via INTRAVENOUS
  Filled 2024-01-12 (×13): qty 100

## 2024-01-12 MED ORDER — POTASSIUM CHLORIDE CRYS ER 20 MEQ PO TBCR
40.0000 meq | EXTENDED_RELEASE_TABLET | Freq: Once | ORAL | Status: AC
  Administered 2024-01-12: 40 meq via ORAL
  Filled 2024-01-12: qty 2

## 2024-01-12 MED ORDER — FUROSEMIDE 10 MG/ML IJ SOLN
30.0000 mg/h | INTRAVENOUS | Status: DC
  Administered 2024-01-12 – 2024-01-15 (×18): 60 mg/h via INTRAVENOUS
  Administered 2024-01-15: 30 mg/h via INTRAVENOUS
  Administered 2024-01-15 (×2): 60 mg/h via INTRAVENOUS
  Administered 2024-01-15: 30 mg/h via INTRAVENOUS
  Administered 2024-01-15: 60 mg/h via INTRAVENOUS
  Administered 2024-01-16 (×4): 30 mg/h via INTRAVENOUS
  Filled 2024-01-12 (×28): qty 20

## 2024-01-12 MED ORDER — WARFARIN SODIUM 1 MG PO TABS
1.5000 mg | ORAL_TABLET | Freq: Once | ORAL | Status: AC
  Administered 2024-01-12: 1.5 mg via ORAL
  Filled 2024-01-12 (×2): qty 1

## 2024-01-12 MED ORDER — METOLAZONE 5 MG PO TABS
10.0000 mg | ORAL_TABLET | Freq: Every day | ORAL | Status: DC
  Administered 2024-01-12 – 2024-01-13 (×2): 10 mg via ORAL
  Filled 2024-01-12 (×2): qty 2

## 2024-01-12 MED ORDER — POTASSIUM CHLORIDE CRYS ER 20 MEQ PO TBCR
40.0000 meq | EXTENDED_RELEASE_TABLET | ORAL | Status: AC
  Administered 2024-01-12 (×2): 40 meq via ORAL
  Filled 2024-01-12 (×2): qty 2

## 2024-01-12 NOTE — Progress Notes (Signed)
 Advanced Heart Failure Rounding Note  [facility] Cardiologist: Rolan Fuel, MD Chief Complaint: Cardiogenic Shock Subjective:    Moved to ICU on 6/11 for cardiogenic shock.   Worsening renal indicies off of lasix  and hypertonic saline. More confused. Feeling worse.  Discussed with wife at bedside that he is not a dialysis candidate.  Will restart high-dose diuretics this week and attempt to decongest, CVP now up to 20.  Remains in sinus rhythm so suspect this may be more related to renal failure as well as worsening HF.     Objective:    Vital Signs:   Temp:  [97.7 F (36.5 C)-98.5 F (36.9 C)] (P) 97.7 F (36.5 C) (06/21 1621) Pulse Rate:  [76-98] 94 (06/21 1500) Resp:  [14-28] 23 (06/21 1500) BP: (79-118)/(59-97) 105/77 (06/21 1500) SpO2:  [90 %-99 %] 98 % (06/21 1500) Weight:  [83.6 kg] 83.6 kg (06/21 0615) Last BM Date : 01/12/24  Weight change: Filed Weights   01/10/24 0405 01/11/24 0428 01/12/24 0615  Weight: 83.1 kg 82.8 kg 83.6 kg   Intake/Output:  Intake/Output Summary (Last 24 hours) at 01/12/2024 1637 Last data filed at 01/12/2024 1600 Gross per 24 hour  Intake 1083.31 ml  Output 1650 ml  Net -566.69 ml   CVP 20  Physical Exam: General:   Chronically ill appearing Cor: Regular rate and rhythm, systolic murmur Lungs: clear Abdomen: soft, nontender, nondistended.  Extremities: no cyanosis, clubbing, rash, R and LLE 2+ edema  Labs: Basic Metabolic Panel: Recent Labs  Lab 01/07/24 1923 01/08/24 0420 01/08/24 1304 01/09/24 0407 01/09/24 1418 01/10/24 0403 01/10/24 1416 01/11/24 0406 01/11/24 1205 01/12/24 0450  NA 122* 124*   < > 123*   < > 122* 123* 122* 121* 120*  K 3.8 4.1   < > 3.5   < > 3.3* 3.9 4.1 4.1 4.0  CL 80* 83*   < > 81*   < > 78* 78* 78* 77* 77*  CO2 30 27   < > 28   < > 29 32 29 29 29   GLUCOSE 268* 157*   < > 161*   < > 161* 112* 142* 168* 118*  BUN 60* 58*   < > 52*   < > 49* 50* 53* 55* 63*  CREATININE 1.83* 1.74*   < > 1.72*   < >  1.72* 1.88* 2.06* 2.20* 2.27*  CALCIUM 8.5* 8.5*   < > 8.6*   < > 8.7* 8.9 8.9 8.9 8.9  MG 2.5* 2.5*  --  2.4  --  2.3  --  2.6*  --   --    < > = values in this interval not displayed.   Liver Function Tests: Recent Labs  Lab 01/11/24 1205  AST 34  ALT 36  ALKPHOS 103  BILITOT 2.3*  PROT 6.0*  ALBUMIN 3.3*    CBC: Recent Labs  Lab 01/08/24 0420 01/09/24 0407 01/10/24 0403 01/11/24 0406 01/12/24 0450  WBC 13.2* 16.6* 16.0* 16.0* 15.2*  HGB 13.9 14.5 14.2 14.5 14.5  HCT 42.7 44.6 43.9 44.3 45.0  MCV 62.3* 61.8* 61.8* 61.9* 61.6*  PLT 151 167 155 174 182   Medications:    Scheduled Medications:  allopurinol   150 mg Oral Daily   amiodarone   200 mg Oral Daily   aspirin  EC  81 mg Oral Daily   Chlorhexidine  Gluconate Cloth  6 each Topical Daily   dapagliflozin  propanediol  10 mg Oral Daily   feeding supplement  237 mL Oral BID  BM   fesoterodine   4 mg Oral QODAY   insulin  aspart  0-15 Units Subcutaneous TID WC   insulin  aspart  0-5 Units Subcutaneous QHS   metolazone   10 mg Oral Daily   polyethylene glycol  17 g Oral Daily   senna-docusate  1 tablet Oral BID   tamsulosin   0.4 mg Oral QODAY   Warfarin - Pharmacist Dosing Inpatient   Does not apply q1600   Infusions:  furosemide  (LASIX ) 200 mg in dextrose  5 % 100 mL (2 mg/mL) infusion 60 mg/hr (01/12/24 1610)   milrinone  0.125 mcg/kg/min (01/12/24 1322)   norepinephrine  (LEVOPHED ) Adult infusion 7 mcg/min (01/12/24 1600)   sodium chloride  3% (hypertonic) Stopped (01/12/24 1336)   vasopressin  0.03 Units/min (01/12/24 1600)   PRN Medications: ondansetron  (ZOFRAN ) IV, mouth rinse, traZODone   Assessment/Plan:   1. Acute on chronic systolic CHF -> cardiogenic shock:  - Coronary angiography in 1/23  mild CAD.  - Echo 01/03/24  EF 25-30% RV mod reduced - Has developed recurrent HF/shock in setting of recurrent AF - Initial LA 2.1. Co-ox 41%  - Coox 55, CVP 19-20 - Continue milrinone  0.125 -  Continue NE for MAP  >65 - Continue vasopressin .  - Continue to hold entresto , toprol  XL, and spiro. - Continue Farxiga  10 mg daily.  -Restart lasix  gtt at 60, hypertonic saline 125mL BID, metolazone  10   2. Mechanical aortic valve: Continue warfarin and ASA 81. - INR 2.6, discussed with pharmacy  3. Paroxysmal Atrial fibrillation/atrial flutter:  - Admitted 1/18 with atrial fibrillation with RVR and CHF.  EF down to 20-25%.  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   - in cardiogenic shock in setting of recurrent AF.  - Failed DC-CV on 6/13 - Now in NSR, off amiodarone  gtt  4. Ascending aortic aneurysm: 4.3 cm on 4/24 MRA chest.   5. AKI on CKD2: Creatinine 2.14 on admit. Baseline 1.5. Suspect cardio-renal.  - Cr trending up again as well as BUN - diuresis as above - continue to support with milrinone  0.125 mcg +  NE + vasopressin    6. Hyperkalemia: resolved. Replace as needed   7. Shock liver - likely 2/2 low output/hepatic congestion   8. Hyponatremia - limit FW, diuresis - Hypertonic saline as above  9. ILD - CT reviewed with P/CCM   Length of Stay: 11  CRITICAL CARE Performed by: Morene JINNY Brownie   Total critical care time: 50 minutes  Critical care time was exclusive of separately billable procedures and treating other patients.  Critical care was necessary to treat or prevent imminent or life-threatening deterioration.  Critical care was time spent personally by me on the following activities: development of treatment plan with patient and/or surrogate as well as nursing, discussions with consultants, evaluation of patient's response to treatment, examination of patient, obtaining history from patient or surrogate, ordering and performing treatments and interventions, ordering and review of laboratory studies, ordering and review of radiographic studies, pulse oximetry and re-evaluation of patient's condition.   Morene JINNY Brownie, MD 01/12/2024, 4:37  PM  Advanced Heart Failure Team Pager [phone] (M-F; 7a - 4p). For after hours, see Amion for on call HF MD

## 2024-01-12 NOTE — Progress Notes (Signed)
 ANTICOAGULATION CONSULT NOTE  Pharmacy Consult for Warfarin Indication: afib with mechanical AVR  Allergies  Allergen Reactions   Celecoxib Rash    Other Reaction(s): Unknown    Patient Measurements: Height: 5' 10 (177.8 cm) Weight: 83.6 kg (184 lb 4.9 oz) IBW/kg (Calculated) : 73  Vital Signs: Temp: 98.1 F (36.7 C) (06/21 0317) Temp Source: Axillary (06/21 0317) BP: 92/77 (06/21 1000) Pulse Rate: 82 (06/21 1000)  Labs: Recent Labs    01/10/24 0403 01/10/24 1416 01/11/24 0406 01/11/24 1205 01/12/24 0450  HGB 14.2  --  14.5  --  14.5  HCT 43.9  --  44.3  --  45.0  PLT 155  --  174  --  182  LABPROT 29.2*  --  29.4*  --  28.1*  INR 2.7*  --  2.8*  --  2.6*  CREATININE 1.72*   < > 2.06* 2.20* 2.27*   < > = values in this interval not displayed.    Estimated Creatinine Clearance: 25.9 mL/min (A) (by C-G formula based on SCr of 2.27 mg/dL (H)).   Medical History: Past Medical History:  Diagnosis Date   Anticoagulant long-term use    Arthritis    probably in my thumbs (06/26/2012)   Bifascicular block    Chronic combined systolic and diastolic CHF (congestive heart failure) (HCC)    a. 01/2016 Echo: EF 45-50%, gr2 DD, inflat AK (poor acoustic windows);  b. 07/2016 Echo: EF 25-30%, mild LVH, PASP (pt in Afib).   Complication of anesthesia    wake up w/a start; hallucinations (06/26/2012)   Critical Aortic Stenosis    a. 03/2003 s/p SJM #25 mech prosthetic AoV;  b. 07/2016 Echo: EF 25-30% (in setting of AF), AoV area 1.72 cm^2 (VTI), 1.75 cm^2 (Vmean).   Diverticulosis    Gouty arthritis    have had it in both feet, ankles, right knee (06/26/2012)   History of diverticulitis of colon    History of kidney stones    History of pancreatitis    Hypertension    Incomplete RBBB    PAF (paroxysmal atrial fibrillation) (HCC)    a. CHA2DS2VASc = 4-->chronic coumadin  in setting of mech AoV;  b. 07/2016 Recurrent AF RVR.    Medications:  Medications Prior to  Admission  Medication Sig Dispense Refill Last Dose/Taking   acetaminophen  (TYLENOL ) 500 MG tablet Take 500 mg by mouth 2 (two) times daily.   01/01/2024   albuterol  (VENTOLIN  HFA) 108 (90 Base) MCG/ACT inhaler Inhale 2 puffs into the lungs every 4 (four) hours.   01/01/2024   allopurinol  (ZYLOPRIM ) 300 MG tablet Take 150 mg by mouth daily.   01/01/2024   aspirin  EC 81 MG tablet Take 81 mg by mouth daily. Swallow whole.   01/01/2024   clobetasol cream (TEMOVATE) 0.05 % Apply 1 Application topically 2 (two) times daily.   01/01/2024   colchicine  0.6 MG tablet Take 1 tablet (0.6 mg total) by mouth daily as needed (for gout).   Unknown   FARXIGA  10 MG TABS tablet TAKE 1 TABLET BY MOUTH EVERY DAY 30 tablet 6 01/01/2024   FIBER PO Take 2 capsules by mouth 2 (two) times daily.   01/01/2024   folic acid  (FOLVITE ) 1 MG tablet TAKE 1 TABLET(1 MG) BY MOUTH DAILY 90 tablet 3 01/01/2024   metoprolol  succinate (TOPROL -XL) 25 MG 24 hr tablet TAKE 1 TABLET BY MOUTH EVERYDAY AT BEDTIME 90 tablet 1 12/31/2023   potassium chloride  SA (KLOR-CON  M) 20 MEQ tablet  Take 1 tablet (20 mEq total) by mouth daily.   01/01/2024   tamsulosin  (FLOMAX ) 0.4 MG CAPS capsule Take 1 capsule (0.4 mg total) by mouth every other day. 90 capsule 3 01/01/2024   tolterodine  (DETROL  LA) 4 MG 24 hr capsule Take 1 capsule (4 mg total) by mouth every other day. Alternating days with the tamsulosin  90 capsule 3 12/31/2023   torsemide  (DEMADEX ) 20 MG tablet Take 3 tablets (60 mg total) by mouth daily.   01/01/2024   warfarin (COUMADIN ) 5 MG tablet Take 0.5-1 tablets (2.5-5 mg total) by mouth See admin instructions. 1/19 and 1/20  take 7.5mg  (1 and 1/2 tab) then take 1 tab (5mg ) daily until seen by MD  Take 1 tablet (5 mg) by mouth on Sundays, Mondays, Wednesdays, Thursdays & Saturdays in the evening. Take 0.5 tablet (2.5 mg) by mouth on Tuesdays & Fridays in the evening. (Patient taking differently: Take 2.5-5 mg by mouth See admin instructions. AS directed by  PCP 2.5 mg Daily) 45 tablet 11 12/31/2023 at  8:00 PM   docusate sodium  (COLACE) 100 MG capsule Take 100 mg by mouth 2 (two) times daily. (Patient not taking: Reported on 01/01/2024)   Not Taking   polyethylene glycol powder (GLYCOLAX /MIRALAX ) 17 GM/SCOOP powder Take 17 g by mouth 2 (two) times daily as needed for moderate constipation or mild constipation. (Patient not taking: Reported on 12/26/2023) 238 g 2    Scheduled:   allopurinol   150 mg Oral Daily   amiodarone   200 mg Oral Daily   aspirin  EC  81 mg Oral Daily   Chlorhexidine  Gluconate Cloth  6 each Topical Daily   dapagliflozin  propanediol  10 mg Oral Daily   feeding supplement  237 mL Oral BID BM   fesoterodine   4 mg Oral QODAY   insulin  aspart  0-15 Units Subcutaneous TID WC   insulin  aspart  0-5 Units Subcutaneous QHS   polyethylene glycol  17 g Oral Daily   senna-docusate  1 tablet Oral BID   tamsulosin   0.4 mg Oral QODAY   Warfarin - Pharmacist Dosing Inpatient   Does not apply q1600   Infusions:   norepinephrine  (LEVOPHED ) Adult infusion 9 mcg/min (01/12/24 1000)   vasopressin  0.03 Units/min (01/12/24 1000)   PRN: ondansetron  (ZOFRAN ) IV, mouth rinse, traZODone   Assessment: 83 yo male presenting with worsening shortness of breath. On warfarin PTA for afib with mechanical AVR.    Per patient, dose is 5 mg daily, poor historian. Upon chart review patient should be on 2.5 mg once daily on Monday, Tuesday, Thursday, Friday, and Sunday and 1 mg on Wednesday and Saturday per last PCP note on 12/18/2023. Goal INR is 2.5-3.5.   INR today is therapeutic at 2.6. Hgb 14.5, plt 182.  Pertinent DDI's - s/p amio IV (none PTA, 6/12 >20), PO amio (6/20 >), allopurinol  (PTA).   Goal of Therapy:  INR 2.5-3.5 Monitor platelets by anticoagulation protocol: Yes   Plan:  Warfarin 1.5 mg tonight (slightly higher than PTA dose) since drifting down and on lower end of goal range  Monitor for s/sx of bleeding, daily INR, CBC Watch for new  DDIs  Thank you for allowing pharmacy to participate in this patient's care,  Suzen Sour, PharmD, BCCCP Clinical Pharmacist  Phone: [phone] 01/12/2024 11:17 AM  Please check AMION for all [facility] phone numbers After 10:00 PM, call Main Pharmacy [phone]

## 2024-01-12 NOTE — Plan of Care (Signed)
  Problem: Clinical Measurements: Goal: Ability to maintain clinical measurements within normal limits will improve Outcome: Not Progressing Goal: Will remain free from infection Outcome: Progressing Goal: Diagnostic test results will improve Outcome: Progressing Goal: Respiratory complications will improve Outcome: Progressing Goal: Cardiovascular complication will be avoided Outcome: Progressing

## 2024-01-13 DIAGNOSIS — R570 Cardiogenic shock: Secondary | ICD-10-CM | POA: Diagnosis not present

## 2024-01-13 LAB — CBC
HCT: 44.7 % (ref 39.0–52.0)
Hemoglobin: 14.4 g/dL (ref 13.0–17.0)
MCH: 19.9 pg — ABNORMAL LOW (ref 26.0–34.0)
MCHC: 32.2 g/dL (ref 30.0–36.0)
MCV: 61.8 fL — ABNORMAL LOW (ref 80.0–100.0)
Platelets: 177 10*3/uL (ref 150–400)
RBC: 7.23 MIL/uL — ABNORMAL HIGH (ref 4.22–5.81)
RDW: 19.5 % — ABNORMAL HIGH (ref 11.5–15.5)
WBC: 14.4 10*3/uL — ABNORMAL HIGH (ref 4.0–10.5)
nRBC: 0.6 % — ABNORMAL HIGH (ref 0.0–0.2)

## 2024-01-13 LAB — GLUCOSE, CAPILLARY
Glucose-Capillary: 116 mg/dL — ABNORMAL HIGH (ref 70–99)
Glucose-Capillary: 132 mg/dL — ABNORMAL HIGH (ref 70–99)

## 2024-01-13 LAB — BASIC METABOLIC PANEL WITH GFR
Anion gap: 11 (ref 5–15)
Anion gap: 15 (ref 5–15)
BUN: 67 mg/dL — ABNORMAL HIGH (ref 8–23)
BUN: 67 mg/dL — ABNORMAL HIGH (ref 8–23)
CO2: 30 mmol/L (ref 22–32)
CO2: 31 mmol/L (ref 22–32)
Calcium: 8.6 mg/dL — ABNORMAL LOW (ref 8.9–10.3)
Calcium: 9 mg/dL (ref 8.9–10.3)
Chloride: 82 mmol/L — ABNORMAL LOW (ref 98–111)
Chloride: 78 mmol/L — ABNORMAL LOW (ref 98–111)
Creatinine, Ser: 2.16 mg/dL — ABNORMAL HIGH (ref 0.61–1.24)
Creatinine, Ser: 2.12 mg/dL — ABNORMAL HIGH (ref 0.61–1.24)
GFR, Estimated: 30 mL/min — ABNORMAL LOW (ref >60)
GFR, Estimated: 30 mL/min — ABNORMAL LOW (ref >60)
Glucose, Bld: 97 mg/dL (ref 70–99)
Glucose, Bld: 212 mg/dL — ABNORMAL HIGH (ref 70–99)
Potassium: 3.6 mmol/L (ref 3.5–5.1)
Potassium: 3.7 mmol/L (ref 3.5–5.1)
Sodium: 123 mmol/L — ABNORMAL LOW (ref 135–145)
Sodium: 124 mmol/L — ABNORMAL LOW (ref 135–145)

## 2024-01-13 LAB — COOXEMETRY PANEL
Carboxyhemoglobin: 1.8 % — ABNORMAL HIGH (ref 0.5–1.5)
Methemoglobin: <0.7 % (ref 0.0–1.5)
O2 Saturation: 63.7 %
Total hemoglobin: 14.7 g/dL (ref 12.0–16.0)

## 2024-01-13 LAB — PROTIME-INR
INR: 2.7 — ABNORMAL HIGH (ref 0.8–1.2)
Prothrombin Time: 28.6 s — ABNORMAL HIGH (ref 11.4–15.2)

## 2024-01-13 LAB — MAGNESIUM: Magnesium: 2.7 mg/dL — ABNORMAL HIGH (ref 1.7–2.4)

## 2024-01-13 MED ORDER — POTASSIUM CHLORIDE CRYS ER 20 MEQ PO TBCR
40.0000 meq | EXTENDED_RELEASE_TABLET | Freq: Once | ORAL | Status: AC
  Administered 2024-01-13: 40 meq via ORAL
  Filled 2024-01-13: qty 2

## 2024-01-13 MED ORDER — WARFARIN SODIUM 2.5 MG PO TABS
2.5000 mg | ORAL_TABLET | Freq: Once | ORAL | Status: AC
  Administered 2024-01-13: 2.5 mg via ORAL
  Filled 2024-01-13: qty 1

## 2024-01-13 MED ORDER — POTASSIUM CHLORIDE CRYS ER 10 MEQ PO TBCR
40.0000 meq | EXTENDED_RELEASE_TABLET | Freq: Every day | ORAL | Status: DC
  Administered 2024-01-13 – 2024-01-24 (×12): 40 meq via ORAL
  Filled 2024-01-13 (×13): qty 4

## 2024-01-13 MED ORDER — AMIODARONE HCL 200 MG PO TABS
400.0000 mg | ORAL_TABLET | Freq: Every day | ORAL | Status: DC
  Administered 2024-01-14 – 2024-01-22 (×9): 400 mg via ORAL
  Filled 2024-01-13 (×9): qty 2

## 2024-01-13 MED ORDER — SODIUM CHLORIDE 3 % IV BOLUS
150.0000 mL | Freq: Two times a day (BID) | INTRAVENOUS | Status: AC
  Administered 2024-01-13 (×2): 150 mL via INTRAVENOUS
  Filled 2024-01-13 (×2): qty 500

## 2024-01-13 NOTE — Progress Notes (Signed)
 ANTICOAGULATION CONSULT NOTE  Pharmacy Consult for Warfarin Indication: afib with mechanical AVR  Allergies  Allergen Reactions   Celecoxib Rash    Other Reaction(s): Unknown    Patient Measurements: Height: 5' 10 (177.8 cm) Weight: 82.1 kg (181 lb) IBW/kg (Calculated) : 73  Vital Signs: Temp: 97.4 F (36.3 C) (06/22 0751) Temp Source: Axillary (06/22 0751) BP: 92/71 (06/22 1000) Pulse Rate: 97 (06/22 1000)  Labs: Recent Labs    01/11/24 0406 01/11/24 1205 01/12/24 0450 01/12/24 1741 01/13/24 0347  HGB 14.5  --  14.5  --  14.4  HCT 44.3  --  45.0  --  44.7  PLT 174  --  182  --  177  LABPROT 29.4*  --  28.1*  --  28.6*  INR 2.8*  --  2.6*  --  2.7*  CREATININE 2.06*   < > 2.27* 2.33* 2.16*   < > = values in this interval not displayed.    Estimated Creatinine Clearance: 27.2 mL/min (A) (by C-G formula based on SCr of 2.16 mg/dL (H)).   Medical History: Past Medical History:  Diagnosis Date   Anticoagulant long-term use    Arthritis    probably in my thumbs (06/26/2012)   Bifascicular block    Chronic combined systolic and diastolic CHF (congestive heart failure) (HCC)    a. 01/2016 Echo: EF 45-50%, gr2 DD, inflat AK (poor acoustic windows);  b. 07/2016 Echo: EF 25-30%, mild LVH, PASP (pt in Afib).   Complication of anesthesia    wake up w/a start; hallucinations (06/26/2012)   Critical Aortic Stenosis    a. 03/2003 s/p SJM #25 mech prosthetic AoV;  b. 07/2016 Echo: EF 25-30% (in setting of AF), AoV area 1.72 cm^2 (VTI), 1.75 cm^2 (Vmean).   Diverticulosis    Gouty arthritis    have had it in both feet, ankles, right knee (06/26/2012)   History of diverticulitis of colon    History of kidney stones    History of pancreatitis    Hypertension    Incomplete RBBB    PAF (paroxysmal atrial fibrillation) (HCC)    a. CHA2DS2VASc = 4-->chronic coumadin  in setting of mech AoV;  b. 07/2016 Recurrent AF RVR.    Medications:  Medications Prior to  Admission  Medication Sig Dispense Refill Last Dose/Taking   acetaminophen  (TYLENOL ) 500 MG tablet Take 500 mg by mouth 2 (two) times daily.   01/01/2024   albuterol  (VENTOLIN  HFA) 108 (90 Base) MCG/ACT inhaler Inhale 2 puffs into the lungs every 4 (four) hours.   01/01/2024   allopurinol  (ZYLOPRIM ) 300 MG tablet Take 150 mg by mouth daily.   01/01/2024   aspirin  EC 81 MG tablet Take 81 mg by mouth daily. Swallow whole.   01/01/2024   clobetasol cream (TEMOVATE) 0.05 % Apply 1 Application topically 2 (two) times daily.   01/01/2024   colchicine  0.6 MG tablet Take 1 tablet (0.6 mg total) by mouth daily as needed (for gout).   Unknown   FARXIGA  10 MG TABS tablet TAKE 1 TABLET BY MOUTH EVERY DAY 30 tablet 6 01/01/2024   FIBER PO Take 2 capsules by mouth 2 (two) times daily.   01/01/2024   folic acid  (FOLVITE ) 1 MG tablet TAKE 1 TABLET(1 MG) BY MOUTH DAILY 90 tablet 3 01/01/2024   metoprolol  succinate (TOPROL -XL) 25 MG 24 hr tablet TAKE 1 TABLET BY MOUTH EVERYDAY AT BEDTIME 90 tablet 1 12/31/2023   potassium chloride  SA (KLOR-CON  M) 20 MEQ tablet Take 1  tablet (20 mEq total) by mouth daily.   01/01/2024   tamsulosin  (FLOMAX ) 0.4 MG CAPS capsule Take 1 capsule (0.4 mg total) by mouth every other day. 90 capsule 3 01/01/2024   tolterodine  (DETROL  LA) 4 MG 24 hr capsule Take 1 capsule (4 mg total) by mouth every other day. Alternating days with the tamsulosin  90 capsule 3 12/31/2023   torsemide  (DEMADEX ) 20 MG tablet Take 3 tablets (60 mg total) by mouth daily.   01/01/2024   warfarin (COUMADIN ) 5 MG tablet Take 0.5-1 tablets (2.5-5 mg total) by mouth See admin instructions. 1/19 and 1/20  take 7.5mg  (1 and 1/2 tab) then take 1 tab (5mg ) daily until seen by MD  Take 1 tablet (5 mg) by mouth on Sundays, Mondays, Wednesdays, Thursdays & Saturdays in the evening. Take 0.5 tablet (2.5 mg) by mouth on Tuesdays & Fridays in the evening. (Patient taking differently: Take 2.5-5 mg by mouth See admin instructions. AS directed by  PCP 2.5 mg Daily) 45 tablet 11 12/31/2023 at  8:00 PM   docusate sodium  (COLACE) 100 MG capsule Take 100 mg by mouth 2 (two) times daily. (Patient not taking: Reported on 01/01/2024)   Not Taking   polyethylene glycol powder (GLYCOLAX /MIRALAX ) 17 GM/SCOOP powder Take 17 g by mouth 2 (two) times daily as needed for moderate constipation or mild constipation. (Patient not taking: Reported on 12/26/2023) 238 g 2    Scheduled:   allopurinol   150 mg Oral Daily   amiodarone   200 mg Oral Daily   aspirin  EC  81 mg Oral Daily   Chlorhexidine  Gluconate Cloth  6 each Topical Daily   dapagliflozin  propanediol  10 mg Oral Daily   feeding supplement  237 mL Oral BID BM   fesoterodine   4 mg Oral QODAY   insulin  aspart  0-15 Units Subcutaneous TID WC   insulin  aspart  0-5 Units Subcutaneous QHS   metolazone   10 mg Oral Daily   polyethylene glycol  17 g Oral Daily   potassium chloride   40 mEq Oral Daily   senna-docusate  1 tablet Oral BID   tamsulosin   0.4 mg Oral QODAY   Warfarin - Pharmacist Dosing Inpatient   Does not apply q1600   Infusions:   furosemide  (LASIX ) 200 mg in dextrose  5 % 100 mL (2 mg/mL) infusion 60 mg/hr (01/13/24 1000)   milrinone  0.125 mcg/kg/min (01/13/24 1000)   norepinephrine  (LEVOPHED ) Adult infusion 6 mcg/min (01/13/24 1000)   vasopressin  0.03 Units/min (01/13/24 1000)   PRN: ondansetron  (ZOFRAN ) IV, mouth rinse, traZODone   Assessment: 83 yo male presenting with worsening shortness of breath. On warfarin PTA for afib with mechanical AVR.    Per patient, dose is 5 mg daily, poor historian. Upon chart review patient should be on 2.5 mg once daily on Monday, Tuesday, Thursday, Friday, and Sunday and 1 mg on Wednesday and Saturday per last PCP note on 12/18/2023. Goal INR is 2.5-3.5.   INR today is therapeutic at 2.7. Hgb 14.4, plt 177.  Pertinent DDI's - s/p amio IV (none PTA, 6/12 >20), PO amio (6/20 >), allopurinol  (PTA). Oral intake documented at 75%.  Goal of Therapy:  INR  2.5-3.5 Monitor platelets by anticoagulation protocol: Yes   Plan:  Warfarin 2.5 mg tonight (PTA regimen) Monitor for s/sx of bleeding, daily INR, CBC Watch for new DDIs  Thank you for allowing pharmacy to participate in this patient's care,  Suzen Sour, PharmD, BCCCP Clinical Pharmacist  Phone: [phone] 01/13/2024 10:37 AM  Please check  AMION for all [facility] Pharmacy phone numbers After 10:00 PM, call Main Pharmacy [phone]

## 2024-01-13 NOTE — Progress Notes (Signed)
 Advanced Heart Failure Rounding Note  [facility] Cardiologist: Rolan Fuel, MD Chief Complaint: Cardiogenic Shock Subjective:    Moved to ICU on 6/11 for cardiogenic shock.    Sitting up in chair, feeling somewhat improved today. Sodium, urine output, and renal function improving slowly. Able to continue weaning NE, on low dose milrinone . Discussed hope that with high dose IV diuresis + HTS we can improve his diuretic resistance to the point he can be transitioned to orals. Poor long term prognosis, but improving today.     Objective:    Vital Signs:   Temp:  [97.4 F (36.3 C)-98.7 F (37.1 C)] 97.7 F (36.5 C) (06/22 1149) Pulse Rate:  [67-243] 100 (06/22 1330) Resp:  [11-28] 13 (06/22 1330) BP: (73-127)/(42-106) 95/71 (06/22 1330) SpO2:  [87 %-100 %] 100 % (06/22 1330) Weight:  [82.1 kg] 82.1 kg (06/22 0500) Last BM Date : 01/12/24  Weight change: Filed Weights   01/11/24 0428 01/12/24 0615 01/13/24 0500  Weight: 82.8 kg 83.6 kg 82.1 kg   Intake/Output:  Intake/Output Summary (Last 24 hours) at 01/13/2024 1403 Last data filed at 01/13/2024 1343 Gross per 24 hour  Intake 2851.61 ml  Output 4200 ml  Net -1348.39 ml   CVP 12  Physical Exam: General:   Chronically ill appearing Cor: Regular rate and rhythm, systolic murmur, mildly elevated with positive V waves Lungs: clear Abdomen: soft, nontender, nondistended.  Extremities: no cyanosis, clubbing, rash, R and LLE trace edema  Labs: Basic Metabolic Panel: Recent Labs  Lab 01/08/24 0420 01/08/24 1304 01/09/24 0407 01/09/24 1418 01/10/24 0403 01/10/24 1416 01/11/24 0406 01/11/24 1205 01/12/24 0450 01/12/24 1741 01/13/24 0347  NA 124*   < > 123*   < > 122*   < > 122* 121* 120* 124* 123*  K 4.1   < > 3.5   < > 3.3*   < > 4.1 4.1 4.0 3.1* 3.6  CL 83*   < > 81*   < > 78*   < > 78* 77* 77* 80* 82*  CO2 27   < > 28   < > 29   < > 29 29 29 31 30   GLUCOSE 157*   < > 161*   < > 161*   < > 142* 168* 118* 121* 97  BUN  58*   < > 52*   < > 49*   < > 53* 55* 63* 66* 67*  CREATININE 1.74*   < > 1.72*   < > 1.72*   < > 2.06* 2.20* 2.27* 2.33* 2.16*  CALCIUM 8.5*   < > 8.6*   < > 8.7*   < > 8.9 8.9 8.9 8.9 8.6*  MG 2.5*  --  2.4  --  2.3  --  2.6*  --   --   --  2.7*   < > = values in this interval not displayed.   Liver Function Tests: Recent Labs  Lab 01/11/24 1205  AST 34  ALT 36  ALKPHOS 103  BILITOT 2.3*  PROT 6.0*  ALBUMIN 3.3*    CBC: Recent Labs  Lab 01/09/24 0407 01/10/24 0403 01/11/24 0406 01/12/24 0450 01/13/24 0347  WBC 16.6* 16.0* 16.0* 15.2* 14.4*  HGB 14.5 14.2 14.5 14.5 14.4  HCT 44.6 43.9 44.3 45.0 44.7  MCV 61.8* 61.8* 61.9* 61.6* 61.8*  PLT 167 155 174 182 177   Medications:    Scheduled Medications:  allopurinol   150 mg Oral Daily   amiodarone   200 mg Oral  Daily   aspirin  EC  81 mg Oral Daily   Chlorhexidine  Gluconate Cloth  6 each Topical Daily   dapagliflozin  propanediol  10 mg Oral Daily   feeding supplement  237 mL Oral BID BM   fesoterodine   4 mg Oral QODAY   insulin  aspart  0-15 Units Subcutaneous TID WC   insulin  aspart  0-5 Units Subcutaneous QHS   metolazone   10 mg Oral Daily   polyethylene glycol  17 g Oral Daily   potassium chloride   40 mEq Oral Daily   senna-docusate  1 tablet Oral BID   tamsulosin   0.4 mg Oral QODAY   warfarin  2.5 mg Oral ONCE-1600   Warfarin - Pharmacist Dosing Inpatient   Does not apply q1600   Infusions:  furosemide  (LASIX ) 200 mg in dextrose  5 % 100 mL (2 mg/mL) infusion 60 mg/hr (01/13/24 1300)   milrinone  0.125 mcg/kg/min (01/13/24 1355)   norepinephrine  (LEVOPHED ) Adult infusion 4 mcg/min (01/13/24 1300)   sodium chloride  3% (hypertonic) Stopped (01/13/24 1252)   vasopressin  0.03 Units/min (01/13/24 1300)   PRN Medications: ondansetron  (ZOFRAN ) IV, mouth rinse, traZODone   Assessment/Plan:   1. Acute on chronic systolic CHF -> cardiogenic shock:  - Coronary angiography in 1/23  mild CAD.  - Echo 01/03/24  EF 25-30%  RV mod reduced - Has developed recurrent HF/shock in setting of recurrent AF - Initial LA 2.1. Co-ox 41%  - Coox 64, CVP now 11 from 20 - Continue milrinone  0.125 -  Continue NE for MAP >65, weaning today - Continue vasopressin  - Continue to hold entresto , toprol  XL, and spiro. - Continue Farxiga  10 mg daily.  -Continue lasix  gtt at 60, hypertonic saline 125mL BID, metolazone  10 for 1 additional day, Hopefully can decrease tomorrow  2. Mechanical aortic valve: Continue warfarin and ASA 81. - INR 2.7, discussed with pharmacy  3. Paroxysmal Atrial fibrillation/atrial flutter:  - Admitted 1/18 with atrial fibrillation with RVR and CHF.  EF down to 20-25%.  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   - in cardiogenic shock in setting of recurrent AF.  - Failed DC-CV on 6/13 - Now in NSR, off amiodarone  gtt - Continue amiodarone  400 mg daily  4. Ascending aortic aneurysm: 4.3 cm on 4/24 MRA chest.   5. AKI on CKD2: Creatinine 2.14 on admit. Baseline 1.5. Suspect cardio-renal.  - Creatinine improving today, BUN somewhat stable - diuresis as above - continue to support with milrinone  0.125 mcg +  NE + vasopressin    6. Hyperkalemia: resolved. Replace as needed   7. Shock liver - likely 2/2 low output/hepatic congestion   8. Hyponatremia - limit FW, diuresis - Hypertonic saline as above  9. ILD - CT reviewed with P/CCM initially   Length of Stay: 12  CRITICAL CARE Performed by: Morene JINNY Brownie   Total critical care time: 35 minutes  Critical care time was exclusive of separately billable procedures and treating other patients.  Critical care was necessary to treat or prevent imminent or life-threatening deterioration.  Critical care was time spent personally by me on the following activities: development of treatment plan with patient and/or surrogate as well as nursing, discussions with consultants, evaluation of patient's response to  treatment, examination of patient, obtaining history from patient or surrogate, ordering and performing treatments and interventions, ordering and review of laboratory studies, ordering and review of radiographic studies, pulse oximetry and re-evaluation of patient's condition.   Morene JINNY Brownie, MD 01/13/2024, 2:03 PM  Advanced  Heart Failure Team Pager [phone] (M-F; 7a - 4p). For after hours, see Amion for on call HF MD

## 2024-01-14 DIAGNOSIS — R570 Cardiogenic shock: Secondary | ICD-10-CM | POA: Diagnosis not present

## 2024-01-14 LAB — GLUCOSE, CAPILLARY
Glucose-Capillary: 206 mg/dL — ABNORMAL HIGH (ref 70–99)
Glucose-Capillary: 212 mg/dL — ABNORMAL HIGH (ref 70–99)
Glucose-Capillary: 177 mg/dL — ABNORMAL HIGH (ref 70–99)
Glucose-Capillary: 263 mg/dL — ABNORMAL HIGH (ref 70–99)
Glucose-Capillary: 211 mg/dL — ABNORMAL HIGH (ref 70–99)
Glucose-Capillary: 147 mg/dL — ABNORMAL HIGH (ref 70–99)

## 2024-01-14 LAB — CBC
HCT: 44.2 % (ref 39.0–52.0)
Hemoglobin: 14.3 g/dL (ref 13.0–17.0)
MCH: 20 pg — ABNORMAL LOW (ref 26.0–34.0)
MCHC: 32.4 g/dL (ref 30.0–36.0)
MCV: 61.9 fL — ABNORMAL LOW (ref 80.0–100.0)
Platelets: 169 10*3/uL (ref 150–400)
RBC: 7.14 MIL/uL — ABNORMAL HIGH (ref 4.22–5.81)
RDW: 19.2 % — ABNORMAL HIGH (ref 11.5–15.5)
WBC: 15.8 10*3/uL — ABNORMAL HIGH (ref 4.0–10.5)
nRBC: 0.5 % — ABNORMAL HIGH (ref 0.0–0.2)

## 2024-01-14 LAB — BASIC METABOLIC PANEL WITH GFR
Anion gap: 15 (ref 5–15)
Anion gap: 13 (ref 5–15)
BUN: 66 mg/dL — ABNORMAL HIGH (ref 8–23)
BUN: 65 mg/dL — ABNORMAL HIGH (ref 8–23)
CO2: 31 mmol/L (ref 22–32)
CO2: 34 mmol/L — ABNORMAL HIGH (ref 22–32)
Calcium: 9.1 mg/dL (ref 8.9–10.3)
Calcium: 8.4 mg/dL — ABNORMAL LOW (ref 8.9–10.3)
Chloride: 80 mmol/L — ABNORMAL LOW (ref 98–111)
Chloride: 90 mmol/L — ABNORMAL LOW (ref 98–111)
Creatinine, Ser: 2.03 mg/dL — ABNORMAL HIGH (ref 0.61–1.24)
Creatinine, Ser: 1.64 mg/dL — ABNORMAL HIGH (ref 0.61–1.24)
GFR, Estimated: 32 mL/min — ABNORMAL LOW (ref >60)
GFR, Estimated: 42 mL/min — ABNORMAL LOW (ref >60)
Glucose, Bld: 156 mg/dL — ABNORMAL HIGH (ref 70–99)
Glucose, Bld: 131 mg/dL — ABNORMAL HIGH (ref 70–99)
Potassium: 3.6 mmol/L (ref 3.5–5.1)
Potassium: 3.4 mmol/L — ABNORMAL LOW (ref 3.5–5.1)
Sodium: 126 mmol/L — ABNORMAL LOW (ref 135–145)
Sodium: 137 mmol/L (ref 135–145)

## 2024-01-14 LAB — COOXEMETRY PANEL
Carboxyhemoglobin: 2.9 % — ABNORMAL HIGH (ref 0.5–1.5)
Methemoglobin: 0.8 % (ref 0.0–1.5)
O2 Saturation: 61.6 %
Total hemoglobin: 14.7 g/dL (ref 12.0–16.0)

## 2024-01-14 LAB — PROTIME-INR
INR: 2.8 — ABNORMAL HIGH (ref 0.8–1.2)
Prothrombin Time: 29.4 s — ABNORMAL HIGH (ref 11.4–15.2)

## 2024-01-14 MED ORDER — WARFARIN SODIUM 2.5 MG PO TABS
2.5000 mg | ORAL_TABLET | Freq: Once | ORAL | Status: AC
  Administered 2024-01-14: 2.5 mg via ORAL
  Filled 2024-01-14: qty 1

## 2024-01-14 MED ORDER — SODIUM CHLORIDE 3 % IV BOLUS
150.0000 mL | Freq: Two times a day (BID) | INTRAVENOUS | Status: AC
  Administered 2024-01-14 (×2): 150 mL via INTRAVENOUS
  Filled 2024-01-14 (×2): qty 500

## 2024-01-14 MED ORDER — POTASSIUM CHLORIDE CRYS ER 20 MEQ PO TBCR
40.0000 meq | EXTENDED_RELEASE_TABLET | ORAL | Status: AC
  Administered 2024-01-14 (×2): 40 meq via ORAL
  Filled 2024-01-14 (×2): qty 2

## 2024-01-14 NOTE — Progress Notes (Signed)
 Physical Therapy Treatment Patient Details Name: Adrian Lambert MRN: [id number] DOB: 1940-10-12 Today's Date: 01/14/2024   History of Present Illness Pt is 83 yo presenting to [facility] ED on 6/10 due to shortness of breath Developed cardiogenic shock, progressive hypotension and signs of hyperperfusion. Admitted to ICU for ionotropes. PMH: arthritis, systolic and diastolic CHF, diverticulosis, HTN, incomplete RBBB, PAF    PT Comments  Patient with soft BPs today and RN recommended at most dangle EOB. Performed supine exercises with BP appropriately rising. Came to sit EOB with mod assist with initial drop in BP, however with seated exercises recovered. Pt denied dizziness throughout. Attempted partial stand/lateral scoot along EOB x 5 with poor anterior wt-shift and inability to clear buttocks off the bed.   Supine BP 85/66(74)  Supine ex's 95/71 (80) Sitting 86/73 (79) Sit ex's 90/75 (82)    If plan is discharge home, recommend the following: Help with stairs or ramp for entrance;Assist for transportation;Assistance with cooking/housework;Two people to help with walking and/or transfers   Can travel by private vehicle        Equipment Recommendations  None recommended by PT    Recommendations for Other Services       Precautions / Restrictions Precautions Precautions: Fall Recall of Precautions/Restrictions: Intact Precaution/Restrictions Comments: watch O2 sats, HR, BP Restrictions Weight Bearing Restrictions Per Provider Order: No     Mobility  Bed Mobility Overal bed mobility: Needs Assistance Bed Mobility: Supine to Sit, Rolling, Sit to Sidelying Rolling: Contact guard assist   Supine to sit: Mod assist, HOB elevated   Sit to sidelying: Mod assist General bed mobility comments: vc for sequencing; assist to raise torso; assist to raise legs    Transfers Overall transfer level: Needs assistance Equipment used: None Transfers: Bed to chair/wheelchair/BSC             Lateral/Scoot Transfers: Max assist General transfer comment: using bed pad under hips; attempted lateral scoot toward HOB x 5 reps with little progress (pt with poor anterior translation); OOB deferred due to soft BPs    Ambulation/Gait                   Stairs             Wheelchair Mobility     Tilt Bed    Modified Rankin (Stroke Patients Only)       Balance Overall balance assessment: Needs assistance Sitting-balance support: No upper extremity supported, Feet supported Sitting balance-Leahy Scale: Fair                                      Hotel manager: Impaired Factors Affecting Communication: Reduced clarity of speech  Cognition Arousal: Alert Behavior During Therapy: WFL for tasks assessed/performed   PT - Cognitive impairments: No apparent impairments                         Following commands: Intact      Cueing Cueing Techniques: Verbal cues  Exercises General Exercises - Lower Extremity Ankle Circles/Pumps: AROM, Both, 10 reps Quad Sets: AROM, Both, 10 reps Long Arc Quad: AROM, Both, 10 reps Heel Slides: AROM, Both, Supine, 10 reps Hip ABduction/ADduction: AROM, Both, 10 reps, Seated Hip Flexion/Marching: AROM, Both, 10 reps    General Comments        Pertinent Vitals/Pain Pain Assessment Pain Assessment: No/denies pain  Home Living                          Prior Function            PT Goals (current goals can now be found in the care plan section) Acute Rehab PT Goals Patient Stated Goal: to return home soon Time For Goal Achievement: 01/16/24 Potential to Achieve Goals: Good Progress towards PT goals: Not progressing toward goals - comment (low BPs)    Frequency    Min 2X/week      PT Plan      Co-evaluation              AM-PAC PT 6 Clicks Mobility   Outcome Measure  Help needed turning from your back to your side while in a  flat bed without using bedrails?: A Little Help needed moving from lying on your back to sitting on the side of a flat bed without using bedrails?: A Lot Help needed moving to and from a bed to a chair (including a wheelchair)?: Total Help needed standing up from a chair using your arms (e.g., wheelchair or bedside chair)?: Total Help needed to walk in hospital room?: Total Help needed climbing 3-5 steps with a railing? : Total 6 Click Score: 9    End of Session   Activity Tolerance: Patient tolerated treatment well Patient left: with call bell/phone within reach;with family/visitor present;in bed Nurse Communication: Mobility status PT Visit Diagnosis: Unsteadiness on feet (R26.81);Muscle weakness (generalized) (M62.81)     Time: 1310-1340 PT Time Calculation (min) (ACUTE ONLY): 30 min  Charges:    $Therapeutic Exercise: 8-22 mins $Therapeutic Activity: 8-22 mins PT General Charges $$ ACUTE PT VISIT: 1 Visit                      Adrian Lambert, PT Acute Rehabilitation Services  Office [phone]    Adrian Adrian Lambert 01/14/2024, 2:13 PM

## 2024-01-14 NOTE — Plan of Care (Signed)
   Problem: Clinical Measurements: Goal: Cardiovascular complication will be avoided Outcome: Progressing   Problem: Activity: Goal: Risk for activity intolerance will decrease Outcome: Progressing   Problem: Coping: Goal: Level of anxiety will decrease Outcome: Progressing

## 2024-01-14 NOTE — Progress Notes (Signed)
 Advanced Heart Failure Rounding Note  [facility] Cardiologist: Rolan Fuel, MD Chief Complaint: Cardiogenic Shock Subjective:    Moved to ICU on 6/11 for cardiogenic shock.   6/21  lasix  drip increased to 60 mg per hour and  Hypertonic Saline added.   Remains on Vaso 0.03, milrinone  0.125 mcg + Norepi 3 mcg   Improved urine output over the last 24 hours.   Denies SOB.   Objective:    Vital Signs:   Temp:  [97.4 F (36.3 C)-98.3 F (36.8 C)] 98.3 F (36.8 C) (06/23 0400) Pulse Rate:  [83-139] 98 (06/23 0700) Resp:  [12-38] 18 (06/23 0700) BP: (71-99)/(49-77) 98/67 (06/23 0700) SpO2:  [84 %-100 %] 100 % (06/23 0700) Weight:  [80.4 kg] 80.4 kg (06/23 0500) Last BM Date : 01/13/24  Weight change: Filed Weights   01/12/24 0615 01/13/24 0500 01/14/24 0500  Weight: 83.6 kg 82.1 kg 80.4 kg   Intake/Output:  Intake/Output Summary (Last 24 hours) at 01/14/2024 0737 Last data filed at 01/14/2024 0600 Gross per 24 hour  Intake 3110.75 ml  Output 4100 ml  Net -989.25 ml   CVP 12-13  Physical Exam: General:   No resp difficulty Neck: supple.JVP 11-12  Cor: PMI nondisplaced. Regular rate & rhythm. No rubs, gallops or murmurs. Lungs: clear Abdomen: soft, nontender, nondistended.  Extremities: no cyanosis, clubbing, rash, R and LLE 1+ edema Neuro: alert & oriented x3  Labs: Basic Metabolic Panel: Recent Labs  Lab 01/08/24 0420 01/08/24 1304 01/09/24 0407 01/09/24 1418 01/10/24 0403 01/10/24 1416 01/11/24 0406 01/11/24 1205 01/12/24 0450 01/12/24 1741 01/13/24 0347 01/13/24 1651 01/14/24 0346  NA 124*   < > 123*   < > 122*   < > 122*   < > 120* 124* 123* 124* 126*  K 4.1   < > 3.5   < > 3.3*   < > 4.1   < > 4.0 3.1* 3.6 3.7 3.6  CL 83*   < > 81*   < > 78*   < > 78*   < > 77* 80* 82* 78* 80*  CO2 27   < > 28   < > 29   < > 29   < > 29 31 30 31 31   GLUCOSE 157*   < > 161*   < > 161*   < > 142*   < > 118* 121* 97 212* 156*  BUN 58*   < > 52*   < > 49*   < > 53*   < >  63* 66* 67* 67* 66*  CREATININE 1.74*   < > 1.72*   < > 1.72*   < > 2.06*   < > 2.27* 2.33* 2.16* 2.12* 2.03*  CALCIUM 8.5*   < > 8.6*   < > 8.7*   < > 8.9   < > 8.9 8.9 8.6* 9.0 9.1  MG 2.5*  --  2.4  --  2.3  --  2.6*  --   --   --  2.7*  --   --    < > = values in this interval not displayed.   Liver Function Tests: Recent Labs  Lab 01/11/24 1205  AST 34  ALT 36  ALKPHOS 103  BILITOT 2.3*  PROT 6.0*  ALBUMIN 3.3*    CBC: Recent Labs  Lab 01/10/24 0403 01/11/24 0406 01/12/24 0450 01/13/24 0347 01/14/24 0346  WBC 16.0* 16.0* 15.2* 14.4* 15.8*  HGB 14.2 14.5 14.5 14.4 14.3  HCT 43.9  44.3 45.0 44.7 44.2  MCV 61.8* 61.9* 61.6* 61.8* 61.9*  PLT 155 174 182 177 169   Medications:    Scheduled Medications:  allopurinol   150 mg Oral Daily   amiodarone   400 mg Oral Daily   aspirin  EC  81 mg Oral Daily   Chlorhexidine  Gluconate Cloth  6 each Topical Daily   dapagliflozin  propanediol  10 mg Oral Daily   feeding supplement  237 mL Oral BID BM   fesoterodine   4 mg Oral QODAY   insulin  aspart  0-15 Units Subcutaneous TID WC   insulin  aspart  0-5 Units Subcutaneous QHS   polyethylene glycol  17 g Oral Daily   potassium chloride   40 mEq Oral Daily   senna-docusate  1 tablet Oral BID   tamsulosin   0.4 mg Oral QODAY   Warfarin - Pharmacist Dosing Inpatient   Does not apply q1600   Infusions:  furosemide  (LASIX ) 200 mg in dextrose  5 % 100 mL (2 mg/mL) infusion 60 mg/hr (01/14/24 0600)   milrinone  0.125 mcg/kg/min (01/14/24 0600)   norepinephrine  (LEVOPHED ) Adult infusion 3 mcg/min (01/14/24 0600)   vasopressin  0.03 Units/min (01/14/24 0600)   PRN Medications: ondansetron  (ZOFRAN ) IV, mouth rinse, traZODone   Assessment/Plan:   1. Acute on chronic systolic CHF -> cardiogenic shock:  - Coronary angiography in 1/23  mild CAD.  - Echo 01/03/24  EF 25-30% RV mod reduced - Has developed recurrent HF/shock in setting of recurrent AF.  Initial LA 2.1. Co-ox 41%  - Coox 63% , CVP  12-13  - Continue milrinone  0.125 + Vaso 0.03, + Norepi 2-3 mcg.  -Continue lasix  gtt at 60, hypertonic saline 125mL BID. Consider stopping tomorrow.   2. Mechanical aortic valve: Continue warfarin and ASA 81. - INR 2.8 discussed with pharmacy  3. Paroxysmal Atrial fibrillation/atrial flutter:  - Admitted 1/18 with atrial fibrillation with RVR and CHF.  EF down to 20-25%.  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   - in cardiogenic shock in setting of recurrent AF.  - Failed DC-CV on 6/13 - Now in NSR, off amiodarone  gtt - Continue amiodarone  400 mg daily  4. Ascending aortic aneurysm: 4.3 cm on 4/24 MRA chest.   5. AKI on CKD2: Creatinine 2.14 on admit. Baseline 1.5. Suspect cardio-renal.  - Renal function improving.    6. Hyperkalemia: resolved. Replace as needed   7. Shock liver - likely 2/2 low output/hepatic congestion   8. Hyponatremia -Sodium 126.  - limit FW, diuresis - Hypertonic saline as above  9. ILD - CT reviewed with P/CCM initially  Needs to get OOB.  Length of Stay: 13  CRITICAL CARE Performed by: Greig Mosses NP-C    Total critical care time: 15  minutes  Critical care time was exclusive of separately billable procedures and treating other patients.  Critical care was necessary to treat or prevent imminent or life-threatening deterioration.  Critical care was time spent personally by me on the following activities: development of treatment plan with patient and/or surrogate as well as nursing, discussions with consultants, evaluation of patient's response to treatment, examination of patient, obtaining history from patient or surrogate, ordering and performing treatments and interventions, ordering and review of laboratory studies, ordering and review of radiographic studies, pulse oximetry and re-evaluation of patient's condition.   Greig Mosses, NP 01/14/2024, 7:37 AM  Advanced Heart Failure Team Pager [phone] (M-F; 7a - 4p).  For after hours, see Amion for on call HF MD

## 2024-01-14 NOTE — Progress Notes (Signed)
 ANTICOAGULATION CONSULT NOTE  Pharmacy Consult for Warfarin Indication: afib with mechanical AVR  Allergies  Allergen Reactions   Celecoxib Rash    Other Reaction(s): Unknown    Patient Measurements: Height: 5' 10 (177.8 cm) Weight: 80.4 kg (177 lb 4 oz) IBW/kg (Calculated) : 73  Vital Signs: Temp: 97.8 F (36.6 C) (06/23 0700) Temp Source: Oral (06/23 0700) BP: 87/55 (06/23 0800) Pulse Rate: 98 (06/23 0700)  Labs: Recent Labs    01/12/24 0450 01/12/24 1741 01/13/24 0347 01/13/24 1651 01/14/24 0346  HGB 14.5  --  14.4  --  14.3  HCT 45.0  --  44.7  --  44.2  PLT 182  --  177  --  169  LABPROT 28.1*  --  28.6*  --  29.4*  INR 2.6*  --  2.7*  --  2.8*  CREATININE 2.27*   < > 2.16* 2.12* 2.03*   < > = values in this interval not displayed.    Estimated Creatinine Clearance: 29 mL/min (A) (by C-G formula based on SCr of 2.03 mg/dL (H)).   Medical History: Past Medical History:  Diagnosis Date   Anticoagulant long-term use    Arthritis    probably in my thumbs (06/26/2012)   Bifascicular block    Chronic combined systolic and diastolic CHF (congestive heart failure) (HCC)    a. 01/2016 Echo: EF 45-50%, gr2 DD, inflat AK (poor acoustic windows);  b. 07/2016 Echo: EF 25-30%, mild LVH, PASP (pt in Afib).   Complication of anesthesia    wake up w/a start; hallucinations (06/26/2012)   Critical Aortic Stenosis    a. 03/2003 s/p SJM #25 mech prosthetic AoV;  b. 07/2016 Echo: EF 25-30% (in setting of AF), AoV area 1.72 cm^2 (VTI), 1.75 cm^2 (Vmean).   Diverticulosis    Gouty arthritis    have had it in both feet, ankles, right knee (06/26/2012)   History of diverticulitis of colon    History of kidney stones    History of pancreatitis    Hypertension    Incomplete RBBB    PAF (paroxysmal atrial fibrillation) (HCC)    a. CHA2DS2VASc = 4-->chronic coumadin  in setting of mech AoV;  b. 07/2016 Recurrent AF RVR.    Medications:  Medications Prior to Admission   Medication Sig Dispense Refill Last Dose/Taking   acetaminophen  (TYLENOL ) 500 MG tablet Take 500 mg by mouth 2 (two) times daily.   01/01/2024   albuterol  (VENTOLIN  HFA) 108 (90 Base) MCG/ACT inhaler Inhale 2 puffs into the lungs every 4 (four) hours.   01/01/2024   allopurinol  (ZYLOPRIM ) 300 MG tablet Take 150 mg by mouth daily.   01/01/2024   aspirin  EC 81 MG tablet Take 81 mg by mouth daily. Swallow whole.   01/01/2024   clobetasol cream (TEMOVATE) 0.05 % Apply 1 Application topically 2 (two) times daily.   01/01/2024   colchicine  0.6 MG tablet Take 1 tablet (0.6 mg total) by mouth daily as needed (for gout).   Unknown   FARXIGA  10 MG TABS tablet TAKE 1 TABLET BY MOUTH EVERY DAY 30 tablet 6 01/01/2024   FIBER PO Take 2 capsules by mouth 2 (two) times daily.   01/01/2024   folic acid  (FOLVITE ) 1 MG tablet TAKE 1 TABLET(1 MG) BY MOUTH DAILY 90 tablet 3 01/01/2024   metoprolol  succinate (TOPROL -XL) 25 MG 24 hr tablet TAKE 1 TABLET BY MOUTH EVERYDAY AT BEDTIME 90 tablet 1 12/31/2023   potassium chloride  SA (KLOR-CON  M) 20 MEQ tablet  Take 1 tablet (20 mEq total) by mouth daily.   01/01/2024   tamsulosin  (FLOMAX ) 0.4 MG CAPS capsule Take 1 capsule (0.4 mg total) by mouth every other day. 90 capsule 3 01/01/2024   tolterodine  (DETROL  LA) 4 MG 24 hr capsule Take 1 capsule (4 mg total) by mouth every other day. Alternating days with the tamsulosin  90 capsule 3 12/31/2023   torsemide  (DEMADEX ) 20 MG tablet Take 3 tablets (60 mg total) by mouth daily.   01/01/2024   warfarin (COUMADIN ) 5 MG tablet Take 0.5-1 tablets (2.5-5 mg total) by mouth See admin instructions. 1/19 and 1/20  take 7.5mg  (1 and 1/2 tab) then take 1 tab (5mg ) daily until seen by MD  Take 1 tablet (5 mg) by mouth on Sundays, Mondays, Wednesdays, Thursdays & Saturdays in the evening. Take 0.5 tablet (2.5 mg) by mouth on Tuesdays & Fridays in the evening. (Patient taking differently: Take 2.5-5 mg by mouth See admin instructions. AS directed by PCP 2.5  mg Daily) 45 tablet 11 12/31/2023 at  8:00 PM   docusate sodium  (COLACE) 100 MG capsule Take 100 mg by mouth 2 (two) times daily. (Patient not taking: Reported on 01/01/2024)   Not Taking   polyethylene glycol powder (GLYCOLAX /MIRALAX ) 17 GM/SCOOP powder Take 17 g by mouth 2 (two) times daily as needed for moderate constipation or mild constipation. (Patient not taking: Reported on 12/26/2023) 238 g 2    Scheduled:   allopurinol   150 mg Oral Daily   amiodarone   400 mg Oral Daily   aspirin  EC  81 mg Oral Daily   Chlorhexidine  Gluconate Cloth  6 each Topical Daily   dapagliflozin  propanediol  10 mg Oral Daily   feeding supplement  237 mL Oral BID BM   fesoterodine   4 mg Oral QODAY   insulin  aspart  0-15 Units Subcutaneous TID WC   insulin  aspart  0-5 Units Subcutaneous QHS   potassium chloride   40 mEq Oral Daily   tamsulosin   0.4 mg Oral QODAY   warfarin  2.5 mg Oral ONCE-1600   Warfarin - Pharmacist Dosing Inpatient   Does not apply q1600   Infusions:   furosemide  (LASIX ) 200 mg in dextrose  5 % 100 mL (2 mg/mL) infusion 60 mg/hr (01/14/24 0804)   milrinone  0.125 mcg/kg/min (01/14/24 0600)   norepinephrine  (LEVOPHED ) Adult infusion 3 mcg/min (01/14/24 0600)   sodium chloride  3% (hypertonic)     vasopressin  0.03 Units/min (01/14/24 0600)   PRN: ondansetron  (ZOFRAN ) IV, mouth rinse, traZODone   Assessment: 83 yo male presenting with worsening shortness of breath. On warfarin PTA for afib with mechanical AVR.    Per patient, dose is 5 mg daily, poor historian. Upon chart review patient should be on 2.5 mg once daily on Monday, Tuesday, Thursday, Friday, and Sunday and 1 mg on Wednesday and Saturday per last PCP note on 12/18/2023. Goal INR is 2.5-3.5.   INR today 6/23 is therapeutic at 2.8. Hgb 14.3, plt 169 WNL and stable. Pertinent DDI's - s/p amio IV (none PTA, 6/12 >20), PO amio (6/20 >), allopurinol  (PTA). Amiodarone  PO dose increased from 200mg  daily to 400mg  daily today (6/23 >). Oral  intake documented at 75%, also receiving nutritional supplements (ensure plus high protein).    Goal of Therapy:  INR 2.5-3.5 Monitor platelets by anticoagulation protocol: Yes   Plan:  Warfarin 2.5 mg tonight (PTA regimen) Monitor for s/sx of bleeding, daily INR, CBC Watch for new DDIs and changes in appetite  Thank you for allowing  pharmacy to participate in this patient's care,  Joesph Specking, PharmD PGY1 Pharmacy Resident 01/14/2024 10:42 AM

## 2024-01-14 NOTE — Progress Notes (Signed)
 Inpatient Rehab Admissions Coordinator:   Continuing to follow from a distance for medical readiness and potential CIR admission when appropriate.  Rehab Admissons Coordinator Erion Weightman, [place], [place] [phone]

## 2024-01-14 NOTE — Inpatient Diabetes Management (Signed)
 Inpatient Diabetes Program Recommendations  AACE/ADA: New Consensus Statement on Inpatient Glycemic Control (2025)  Target Ranges:  Prepandial:   less than 140 mg/dL      Peak postprandial:   less than 180 mg/dL (1-2 hours)      Critically ill patients:  140 - 180 mg/dL   Lab Results  Component Value Date   GLUCAP 263 (H) 01/14/2024   HGBA1C 6.6 (H) 01/08/2024    Review of Glycemic Control  Latest Reference Range & Units 01/12/24 16:21 01/12/24 20:55 01/13/24 06:12 01/13/24 21:01 01/14/24 06:22 01/14/24 11:57  Glucose-Capillary 70 - 99 mg/dL 777 (H) 87 883 (H) 867 (H) 177 (H) 263 (H)   Diabetes history: None (A1C=6.6%) Outpatient Diabetes medications:  Farxiga  10 mg daily  Current orders for Inpatient glycemic control:  Novolog  0-15 units tid with meals and HS Farxiga  10 mg daily Inpatient Diabetes Program Recommendations:    May consider adding Novolog  meal coverage 2-3 units tid with meals (hold if patient eats less than 50% or NPO),    Thanks,  Randall Bullocks, RN, BC-ADM Inpatient Diabetes Coordinator Pager [phone]  (8a-5p)

## 2024-01-15 DIAGNOSIS — R570 Cardiogenic shock: Secondary | ICD-10-CM | POA: Diagnosis not present

## 2024-01-15 LAB — GLUCOSE, CAPILLARY
Glucose-Capillary: 205 mg/dL — ABNORMAL HIGH (ref 70–99)
Glucose-Capillary: 216 mg/dL — ABNORMAL HIGH (ref 70–99)
Glucose-Capillary: 190 mg/dL — ABNORMAL HIGH (ref 70–99)
Glucose-Capillary: 45 mg/dL — ABNORMAL LOW (ref 70–99)
Glucose-Capillary: 51 mg/dL — ABNORMAL LOW (ref 70–99)
Glucose-Capillary: 117 mg/dL — ABNORMAL HIGH (ref 70–99)

## 2024-01-15 LAB — PROTIME-INR
INR: 3.8 — ABNORMAL HIGH (ref 0.8–1.2)
INR: 2.8 — ABNORMAL HIGH (ref 0.8–1.2)
Prothrombin Time: 37.8 s — ABNORMAL HIGH (ref 11.4–15.2)
Prothrombin Time: 29.6 s — ABNORMAL HIGH (ref 11.4–15.2)

## 2024-01-15 LAB — RENAL FUNCTION PANEL
Albumin: 3.3 g/dL — ABNORMAL LOW (ref 3.5–5.0)
Anion gap: 15 (ref 5–15)
BUN: 63 mg/dL — ABNORMAL HIGH (ref 8–23)
CO2: 35 mmol/L — ABNORMAL HIGH (ref 22–32)
Calcium: 9.1 mg/dL (ref 8.9–10.3)
Chloride: 76 mmol/L — ABNORMAL LOW (ref 98–111)
Creatinine, Ser: 1.74 mg/dL — ABNORMAL HIGH (ref 0.61–1.24)
GFR, Estimated: 39 mL/min — ABNORMAL LOW (ref >60)
Glucose, Bld: 251 mg/dL — ABNORMAL HIGH (ref 70–99)
Phosphorus: 3 mg/dL (ref 2.5–4.6)
Potassium: 4.3 mmol/L (ref 3.5–5.1)
Sodium: 126 mmol/L — ABNORMAL LOW (ref 135–145)

## 2024-01-15 LAB — BASIC METABOLIC PANEL WITH GFR
Anion gap: 17 — ABNORMAL HIGH (ref 5–15)
BUN: 65 mg/dL — ABNORMAL HIGH (ref 8–23)
CO2: 32 mmol/L (ref 22–32)
Calcium: 8.8 mg/dL — ABNORMAL LOW (ref 8.9–10.3)
Chloride: 76 mmol/L — ABNORMAL LOW (ref 98–111)
Creatinine, Ser: 1.78 mg/dL — ABNORMAL HIGH (ref 0.61–1.24)
GFR, Estimated: 38 mL/min — ABNORMAL LOW (ref >60)
Glucose, Bld: 146 mg/dL — ABNORMAL HIGH (ref 70–99)
Potassium: 3.1 mmol/L — ABNORMAL LOW (ref 3.5–5.1)
Sodium: 125 mmol/L — ABNORMAL LOW (ref 135–145)

## 2024-01-15 LAB — CBC
HCT: 43.3 % (ref 39.0–52.0)
Hemoglobin: 13.8 g/dL (ref 13.0–17.0)
MCH: 19.7 pg — ABNORMAL LOW (ref 26.0–34.0)
MCHC: 31.9 g/dL (ref 30.0–36.0)
MCV: 61.9 fL — ABNORMAL LOW (ref 80.0–100.0)
Platelets: 183 10*3/uL (ref 150–400)
RBC: 6.99 MIL/uL — ABNORMAL HIGH (ref 4.22–5.81)
RDW: 19.2 % — ABNORMAL HIGH (ref 11.5–15.5)
WBC: 15.4 10*3/uL — ABNORMAL HIGH (ref 4.0–10.5)
nRBC: 0.7 % — ABNORMAL HIGH (ref 0.0–0.2)

## 2024-01-15 LAB — COOXEMETRY PANEL
Carboxyhemoglobin: 2.1 % — ABNORMAL HIGH (ref 0.5–1.5)
Methemoglobin: 0.7 % (ref 0.0–1.5)
O2 Saturation: 76.2 %
Total hemoglobin: 14.5 g/dL (ref 12.0–16.0)

## 2024-01-15 LAB — MAGNESIUM: Magnesium: 2.8 mg/dL — ABNORMAL HIGH (ref 1.7–2.4)

## 2024-01-15 MED ORDER — WARFARIN SODIUM 2.5 MG PO TABS
2.5000 mg | ORAL_TABLET | Freq: Once | ORAL | Status: AC
  Administered 2024-01-15: 2.5 mg via ORAL
  Filled 2024-01-15: qty 1

## 2024-01-15 MED ORDER — DEXTROSE 50 % IV SOLN
25.0000 g | INTRAVENOUS | Status: AC
  Administered 2024-01-15: 25 g via INTRAVENOUS

## 2024-01-15 MED ORDER — POTASSIUM CHLORIDE CRYS ER 20 MEQ PO TBCR
40.0000 meq | EXTENDED_RELEASE_TABLET | Freq: Once | ORAL | Status: AC
  Administered 2024-01-15: 40 meq via ORAL
  Filled 2024-01-15: qty 2

## 2024-01-15 MED ORDER — SODIUM CHLORIDE 3 % IV BOLUS
150.0000 mL | Freq: Two times a day (BID) | INTRAVENOUS | Status: AC
  Administered 2024-01-15 (×2): 150 mL via INTRAVENOUS
  Filled 2024-01-15 (×2): qty 500

## 2024-01-15 MED ORDER — DEXTROSE 50 % IV SOLN
INTRAVENOUS | Status: AC
  Filled 2024-01-15: qty 50

## 2024-01-15 MED ORDER — INSULIN ASPART 100 UNIT/ML IJ SOLN
5.0000 [IU] | Freq: Three times a day (TID) | INTRAMUSCULAR | Status: DC
  Administered 2024-01-15 (×2): 5 [IU] via SUBCUTANEOUS

## 2024-01-15 MED ORDER — POTASSIUM CHLORIDE 10 MEQ/50ML IV SOLN
10.0000 meq | INTRAVENOUS | Status: AC
  Administered 2024-01-15 (×4): 10 meq via INTRAVENOUS
  Filled 2024-01-15 (×4): qty 50

## 2024-01-15 NOTE — Progress Notes (Signed)
 Physical Therapy Treatment Patient Details Name: Adrian Lambert MRN: [id number] DOB: Sep 21, 1940 Today's Date: 01/15/2024   History of Present Illness Pt is 83 yo presenting to [facility] ED on 6/10 due to shortness of breath Developed cardiogenic shock, progressive hypotension and signs of hyperperfusion. Admitted to ICU for ionotropes. PMH: arthritis, systolic and diastolic CHF, diverticulosis, HTN, incomplete RBBB, PAF    PT Comments  Patient with brighter affect. BP tolerating standing and stepping around to chair. Requires +2 mod assist for transfer with RW and anticipate will need stedy for return to bed (stands with min assist, however difficulty with weight-shifting and stepping). Continues to be very motivated.     If plan is discharge home, recommend the following: Help with stairs or ramp for entrance;Assist for transportation;Assistance with cooking/housework;Two people to help with walking and/or transfers   Can travel by private vehicle        Equipment Recommendations  None recommended by PT    Recommendations for Other Services       Precautions / Restrictions Precautions Precautions: Fall Recall of Precautions/Restrictions: Intact Precaution/Restrictions Comments: watch O2 sats, HR, BP Restrictions Weight Bearing Restrictions Per Provider Order: No     Mobility  Bed Mobility Overal bed mobility: Needs Assistance Bed Mobility: Supine to Sit     Supine to sit: +2 for physical assistance, Min assist, HOB elevated     General bed mobility comments: exiting on the L side. pt using bed rail and needs (A) to elevate from bed surface. pt with pad used to pull toward EOB static sitting.    Transfers Overall transfer level: Needs assistance Equipment used: None Transfers: Sit to/from Stand Sit to Stand: +2 physical assistance, Mod assist, From elevated surface Stand pivot transfers: +2 physical assistance, Mod assist         General transfer comment: pt needs help  to elevated but able to engaged and static standing. pt standing with weight shift with RW. pt requires bil UE. pt stepping toward R side with weight shift facilitated by therapist. pt benefits from stedy with RN staff    Ambulation/Gait               General Gait Details: unable   Stairs             Wheelchair Mobility     Tilt Bed    Modified Rankin (Stroke Patients Only)       Balance Overall balance assessment: Needs assistance Sitting-balance support: Bilateral upper extremity supported, Feet supported Sitting balance-Leahy Scale: Fair     Standing balance support: Bilateral upper extremity supported, Reliant on assistive device for balance, During functional activity Standing balance-Leahy Scale: Poor Standing balance comment: pt seeking stability with UEs, tentative in standing, requires increased time to stand fully upright                            Communication Communication Communication: Impaired Factors Affecting Communication: Reduced clarity of speech  Cognition Arousal: Alert Behavior During Therapy: WFL for tasks assessed/performed   PT - Cognitive impairments: No apparent impairments                         Following commands: Intact      Cueing Cueing Techniques: Verbal cues  Exercises General Exercises - Lower Extremity Ankle Circles/Pumps: AROM, Both, 10 reps Heel Slides: AROM, Both, Supine, 10 reps Other Exercises Other Exercises: hip flexion( marching) and abduction  of arms ( chicken wing flap) to help with recover at eob 15 times.    General Comments General comments (skin integrity, edema, etc.): BP supine 87/63 (76) sitting 74/63 (68) sitting exercises standing 75/58(65) pt without symptoms      Pertinent Vitals/Pain Pain Assessment Pain Assessment: No/denies pain    Home Living                          Prior Function            PT Goals (current goals can now be found in the care  plan section) Acute Rehab PT Goals Patient Stated Goal: to return home soon Time For Goal Achievement: 01/16/24 Potential to Achieve Goals: Good Progress towards PT goals: Progressing toward goals    Frequency    Min 2X/week      PT Plan      Co-evaluation PT/OT/SLP Co-Evaluation/Treatment: Yes Reason for Co-Treatment: For patient/therapist safety;Complexity of the patient's impairments (multi-system involvement);To address functional/ADL transfers PT goals addressed during session: Mobility/safety with mobility OT goals addressed during session: ADL's and self-care      AM-PAC PT 6 Clicks Mobility   Outcome Measure  Help needed turning from your back to your side while in a flat bed without using bedrails?: A Little Help needed moving from lying on your back to sitting on the side of a flat bed without using bedrails?: A Lot Help needed moving to and from a bed to a chair (including a wheelchair)?: Total Help needed standing up from a chair using your arms (e.g., wheelchair or bedside chair)?: Total Help needed to walk in hospital room?: Total Help needed climbing 3-5 steps with a railing? : Total 6 Click Score: 9    End of Session Equipment Utilized During Treatment: Gait belt Activity Tolerance: Patient limited by fatigue Patient left: with call bell/phone within reach;in chair Nurse Communication: Mobility status;Need for lift equipment (recommend stedy) PT Visit Diagnosis: Unsteadiness on feet (R26.81);Muscle weakness (generalized) (M62.81)     Time: 1024-1050 PT Time Calculation (min) (ACUTE ONLY): 26 min  Charges:    $Gait Training: 8-22 mins PT General Charges $$ ACUTE PT VISIT: 1 Visit                      [name], PT Acute Rehabilitation Services  Office [phone]    [name] 01/15/2024, 12:27 PM

## 2024-01-15 NOTE — Inpatient Diabetes Management (Signed)
 Inpatient Diabetes Program Recommendations  AACE/ADA: New Consensus Statement on Inpatient Glycemic Control (2015)  Target Ranges:  Prepandial:   less than 140 mg/dL      Peak postprandial:   less than 180 mg/dL (1-2 hours)      Critically ill patients:  140 - 180 mg/dL   Lab Results  Component Value Date   GLUCAP 216 (H) 01/15/2024   HGBA1C 6.6 (H) 01/08/2024    Review of Glycemic Control  Latest Reference Range & Units 01/14/24 06:22 01/14/24 11:57 01/14/24 16:09 01/14/24 21:30 01/15/24 06:40 01/15/24 11:31  Glucose-Capillary 70 - 99 mg/dL 822 (H) 736 (H) 788 (H) 147 (H) 205 (H) 216 (H)   Diabetes history: DM  Outpatient Diabetes medications:  Farxiga  10 mg daily Current orders for Inpatient glycemic control:  Novolog  0-15 units tid with meals and HS Farxiga  10 mg daily Inpatient Diabetes Program Recommendations:    If appropriate, consider adding Semglee 10 units daily due to CBG's>goal.  Thanks,  Randall Bullocks, RN, BC-ADM Inpatient Diabetes Coordinator Pager [phone]  (8a-5p)

## 2024-01-15 NOTE — Progress Notes (Signed)
 Hypoglycemic Event  CBG: 45  Treatment: 8 oz juice/soda  Symptoms: None  Follow-up CBG: Time:2216 CBG Result:51  Possible Reasons for Event: Inadequate meal intake and Medication regimen:    Comments/MD notified:Protocol Followed. Will notify team and do another set of protocol.     Johonna Binette M Tayelor Osborne

## 2024-01-15 NOTE — Progress Notes (Signed)
 Inpatient Rehab Admissions Coordinator:    CIR following at a distance. Pt. Remains on multiple drips, not yet ready for CIR at this time.   Leita Kleine, MS, CCC-SLP Rehab Admissions Coordinator  [phone] (celll) [phone] (office)

## 2024-01-15 NOTE — Plan of Care (Signed)
  Problem: Education: Goal: Knowledge of General Education information will improve Description: Including pain rating scale, medication(s)/side effects and non-pharmacologic comfort measures Outcome: Progressing   Problem: Clinical Measurements: Goal: Ability to maintain clinical measurements within normal limits will improve Outcome: Progressing Goal: Will remain free from infection Outcome: Progressing Goal: Diagnostic test results will improve Outcome: Progressing Goal: Respiratory complications will improve Outcome: Progressing   Problem: Nutrition: Goal: Adequate nutrition will be maintained Outcome: Progressing   

## 2024-01-15 NOTE — Progress Notes (Signed)
 Occupational Therapy Treatment Patient Details Name: Adrian Lambert MRN: [id number] DOB: 1941-03-19 Today's Date: 01/15/2024   History of present illness Pt is 83 yo presenting to [facility] ED on 6/10 due to shortness of breath Developed cardiogenic shock, progressive hypotension and signs of hyperperfusion. Admitted to ICU for ionotropes. PMH: arthritis, systolic and diastolic CHF, diverticulosis, HTN, incomplete RBBB, PAF   OT comments  Pt progressed to sitting in chair this session with stand pivot two person (A). Recommendation that RN staff use Stedy for transfers daily. Pt benefits from increased time due to BP changes with transfer. Pt is asymptomatic to BP decrease. Recommendation for Patient will benefit from intensive inpatient follow-up therapy, >3 hours/day       If plan is discharge home, recommend the following:  Two people to help with walking and/or transfers;Two people to help with bathing/dressing/bathroom   Equipment Recommendations  BSC/3in1;Other (comment) (RW)    Recommendations for Other Services Rehab consult    Precautions / Restrictions Precautions Precautions: Fall Recall of Precautions/Restrictions: Intact Precaution/Restrictions Comments: watch O2 sats, HR, BP Restrictions Weight Bearing Restrictions Per Provider Order: No       Mobility Bed Mobility Overal bed mobility: Needs Assistance Bed Mobility: Supine to Sit     Supine to sit: +2 for physical assistance, Min assist, HOB elevated, Used rails     General bed mobility comments: exiting on the L side. pt using bed rail and needs (A) to elevate from bed surface. pt with pad used to pull toward EOB static sitting.    Transfers Overall transfer level: Needs assistance   Transfers: Sit to/from Stand Sit to Stand: +2 physical assistance, Mod assist, From elevated surface Stand pivot transfers: +2 physical assistance, Mod assist         General transfer comment: pt needs help to elevated but  able to engaged and static standing. pt standing with weight shift with RW. pt requires bil UE. pt stepping toward R side with weight shift facilitated by therapist. pt benefits from stedy with RN staff     Balance Overall balance assessment: Needs assistance Sitting-balance support: Bilateral upper extremity supported, Feet supported Sitting balance-Leahy Scale: Fair     Standing balance support: Bilateral upper extremity supported, Reliant on assistive device for balance, During functional activity Standing balance-Leahy Scale: Poor                             ADL either performed or assessed with clinical judgement   ADL Overall ADL's : Needs assistance/impaired                         Toilet Transfer: +2 for physical assistance;Moderate assistance;Stand-pivot Toilet Transfer Details (indicate cue type and reason): simulated                Extremity/Trunk Assessment Upper Extremity Assessment Upper Extremity Assessment: Generalized weakness   Lower Extremity Assessment Lower Extremity Assessment: Defer to PT evaluation        Vision       Perception     Praxis     Communication Communication Communication: Impaired Factors Affecting Communication: Reduced clarity of speech   Cognition Arousal: Alert Behavior During Therapy: WFL for tasks assessed/performed Cognition: No apparent impairments             OT - Cognition Comments: slowed processing at times, but also hard of hearing  Following commands: Intact        Cueing      Exercises Exercises: Other exercises Other Exercises Other Exercises: hip flexion( marching) and abduction of arms ( chicken wing flap) to help with recover at eob 15 times.    Shoulder Instructions       General Comments BP supine 87/63 (76) sitting 74/63 (68) sitting exercises standing 75/58(65) pt without symptoms    Pertinent Vitals/ Pain       Pain Assessment Pain  Assessment: No/denies pain  Home Living                                          Prior Functioning/Environment              Frequency  Min 2X/week        Progress Toward Goals  OT Goals(current goals can now be found in the care plan section)  Progress towards OT goals: Progressing toward goals  Acute Rehab OT Goals OT Goal Formulation: With patient Time For Goal Achievement: 01/29/24 Potential to Achieve Goals: Good ADL Goals Pt Will Perform Grooming: with supervision;standing Pt Will Perform Lower Body Bathing: with supervision;sit to/from stand Pt Will Perform Lower Body Dressing: with supervision;sit to/from stand Pt Will Transfer to Toilet: with supervision;ambulating Pt Will Perform Toileting - Clothing Manipulation and hygiene: with supervision;sit to/from stand Additional ADL Goal #1: Pt will generalize energy conservation strategies in ADLs and mobility.  Plan      Co-evaluation    PT/OT/SLP Co-Evaluation/Treatment: Yes Reason for Co-Treatment: For patient/therapist safety;Complexity of the patient's impairments (multi-system involvement)   OT goals addressed during session: ADL's and self-care      AM-PAC OT 6 Clicks Daily Activity     Outcome Measure   Help from another person eating meals?: A Little Help from another person taking care of personal grooming?: A Little Help from another person toileting, which includes using toliet, bedpan, or urinal?: A Lot Help from another person bathing (including washing, rinsing, drying)?: A Lot Help from another person to put on and taking off regular upper body clothing?: A Little Help from another person to put on and taking off regular lower body clothing?: A Lot 6 Click Score: 15    End of Session Equipment Utilized During Treatment: Gait belt;Rolling walker (2 wheels)  OT Visit Diagnosis: Unsteadiness on feet (R26.81)   Activity Tolerance Patient tolerated treatment well    Patient Left in chair;with call bell/phone within reach;Other (comment) (geo mat)   Nurse Communication Mobility status;Precautions        Time: 8968-8948 OT Time Calculation (min): 20 min  Charges: OT General Charges $OT Visit: 1 Visit OT Treatments $Self Care/Home Management : 8-22 mins   Brynn, OTR/L  Acute Rehabilitation Services Office: [phone] .   Ely Molt 01/15/2024, 11:17 AM

## 2024-01-15 NOTE — Progress Notes (Signed)
 Hypoglycemic, d/c the meal time aspart 5 units tidac, left ACHS SSI on.

## 2024-01-15 NOTE — Progress Notes (Signed)
 ANTICOAGULATION CONSULT NOTE  Pharmacy Consult for Warfarin Indication: afib with mechanical AVR  Allergies  Allergen Reactions   Celecoxib Rash    Other Reaction(s): Unknown    Patient Measurements: Height: 5' 10 (177.8 cm) Weight: 84.7 kg (186 lb 11.7 oz) IBW/kg (Calculated) : 73  Vital Signs: Temp: 98 F (36.7 C) (06/24 0330) Temp Source: Axillary (06/24 0330) BP: 96/71 (06/24 0600) Pulse Rate: 88 (06/24 0600)  Labs: Recent Labs    01/13/24 0347 01/13/24 1651 01/14/24 0346 01/14/24 2128 01/15/24 0418  HGB 14.4  --  14.3  --  13.8  HCT 44.7  --  44.2  --  43.3  PLT 177  --  169  --  183  LABPROT 28.6*  --  29.4*  --  37.8*  INR 2.7*  --  2.8*  --  3.8*  CREATININE 2.16*   < > 2.03* 1.64* 1.78*   < > = values in this interval not displayed.    Estimated Creatinine Clearance: 33 mL/min (A) (by C-G formula based on SCr of 1.78 mg/dL (H)).   Medical History: Past Medical History:  Diagnosis Date   Anticoagulant long-term use    Arthritis    probably in my thumbs (06/26/2012)   Bifascicular block    Chronic combined systolic and diastolic CHF (congestive heart failure) (HCC)    a. 01/2016 Echo: EF 45-50%, gr2 DD, inflat AK (poor acoustic windows);  b. 07/2016 Echo: EF 25-30%, mild LVH, PASP (pt in Afib).   Complication of anesthesia    wake up w/a start; hallucinations (06/26/2012)   Critical Aortic Stenosis    a. 03/2003 s/p SJM #25 mech prosthetic AoV;  b. 07/2016 Echo: EF 25-30% (in setting of AF), AoV area 1.72 cm^2 (VTI), 1.75 cm^2 (Vmean).   Diverticulosis    Gouty arthritis    have had it in both feet, ankles, right knee (06/26/2012)   History of diverticulitis of colon    History of kidney stones    History of pancreatitis    Hypertension    Incomplete RBBB    PAF (paroxysmal atrial fibrillation) (HCC)    a. CHA2DS2VASc = 4-->chronic coumadin  in setting of mech AoV;  b. 07/2016 Recurrent AF RVR.    Medications:  Medications Prior to  Admission  Medication Sig Dispense Refill Last Dose/Taking   acetaminophen  (TYLENOL ) 500 MG tablet Take 500 mg by mouth 2 (two) times daily.   01/01/2024   albuterol  (VENTOLIN  HFA) 108 (90 Base) MCG/ACT inhaler Inhale 2 puffs into the lungs every 4 (four) hours.   01/01/2024   allopurinol  (ZYLOPRIM ) 300 MG tablet Take 150 mg by mouth daily.   01/01/2024   aspirin  EC 81 MG tablet Take 81 mg by mouth daily. Swallow whole.   01/01/2024   clobetasol cream (TEMOVATE) 0.05 % Apply 1 Application topically 2 (two) times daily.   01/01/2024   colchicine  0.6 MG tablet Take 1 tablet (0.6 mg total) by mouth daily as needed (for gout).   Unknown   FARXIGA  10 MG TABS tablet TAKE 1 TABLET BY MOUTH EVERY DAY 30 tablet 6 01/01/2024   FIBER PO Take 2 capsules by mouth 2 (two) times daily.   01/01/2024   folic acid  (FOLVITE ) 1 MG tablet TAKE 1 TABLET(1 MG) BY MOUTH DAILY 90 tablet 3 01/01/2024   metoprolol  succinate (TOPROL -XL) 25 MG 24 hr tablet TAKE 1 TABLET BY MOUTH EVERYDAY AT BEDTIME 90 tablet 1 12/31/2023   potassium chloride  SA (KLOR-CON  M) 20 MEQ tablet  Take 1 tablet (20 mEq total) by mouth daily.   01/01/2024   tamsulosin  (FLOMAX ) 0.4 MG CAPS capsule Take 1 capsule (0.4 mg total) by mouth every other day. 90 capsule 3 01/01/2024   tolterodine  (DETROL  LA) 4 MG 24 hr capsule Take 1 capsule (4 mg total) by mouth every other day. Alternating days with the tamsulosin  90 capsule 3 12/31/2023   torsemide  (DEMADEX ) 20 MG tablet Take 3 tablets (60 mg total) by mouth daily.   01/01/2024   warfarin (COUMADIN ) 5 MG tablet Take 0.5-1 tablets (2.5-5 mg total) by mouth See admin instructions. 1/19 and 1/20  take 7.5mg  (1 and 1/2 tab) then take 1 tab (5mg ) daily until seen by MD  Take 1 tablet (5 mg) by mouth on Sundays, Mondays, Wednesdays, Thursdays & Saturdays in the evening. Take 0.5 tablet (2.5 mg) by mouth on Tuesdays & Fridays in the evening. (Patient taking differently: Take 2.5-5 mg by mouth See admin instructions. AS directed by  PCP 2.5 mg Daily) 45 tablet 11 12/31/2023 at  8:00 PM   docusate sodium  (COLACE) 100 MG capsule Take 100 mg by mouth 2 (two) times daily. (Patient not taking: Reported on 01/01/2024)   Not Taking   polyethylene glycol powder (GLYCOLAX /MIRALAX ) 17 GM/SCOOP powder Take 17 g by mouth 2 (two) times daily as needed for moderate constipation or mild constipation. (Patient not taking: Reported on 12/26/2023) 238 g 2    Scheduled:   allopurinol   150 mg Oral Daily   amiodarone   400 mg Oral Daily   aspirin  EC  81 mg Oral Daily   Chlorhexidine  Gluconate Cloth  6 each Topical Daily   dapagliflozin  propanediol  10 mg Oral Daily   feeding supplement  237 mL Oral BID BM   fesoterodine   4 mg Oral QODAY   insulin  aspart  0-15 Units Subcutaneous TID WC   insulin  aspart  0-5 Units Subcutaneous QHS   potassium chloride   40 mEq Oral Daily   potassium chloride   40 mEq Oral Once   tamsulosin   0.4 mg Oral QODAY   Warfarin - Pharmacist Dosing Inpatient   Does not apply q1600   Infusions:   furosemide  (LASIX ) 200 mg in dextrose  5 % 100 mL (2 mg/mL) infusion 60 mg/hr (01/15/24 0600)   milrinone  0.125 mcg/kg/min (01/15/24 0600)   norepinephrine  (LEVOPHED ) Adult infusion 3 mcg/min (01/15/24 0600)   vasopressin  0.03 Units/min (01/15/24 0600)   PRN: ondansetron  (ZOFRAN ) IV, mouth rinse, traZODone   Assessment: 83 yo male presenting with worsening shortness of breath. On warfarin PTA for afib with mechanical AVR.    Per patient, dose is 5 mg daily, poor historian. Upon chart review patient should be on 2.5 mg once daily on Monday, Tuesday, Thursday, Friday, and Sunday and 1 mg on Wednesday and Saturday per last PCP note on 12/18/2023. Goal INR is 2.5-3.5.   Pertinent DDI's - s/p amio IV (none PTA, 6/12 >20), PO amio (6/20 >), allopurinol  (PTA). Amiodarone  PO dose increased from 200mg  daily to 400mg  daily today (6/23 >). Oral intake documented at 75%, also receiving nutritional supplements (ensure plus high  protein).  6/24 AM: INR 2.8, therapeutic on PTA regimen (1 mg Wednesday, Saturday and 2.5 mg EOD). CBC stable (Hgb 15.4, PLT 183). No bleeding noted. Noted amiodarone  was increased from 200 mg to 400 mg daily yesterday (6/23). Oral intake documented at 25%, also receiving nutritional supplements.    Goal of Therapy:  INR 2.5-3.5 Monitor platelets by anticoagulation protocol: Yes   Plan:  Warfarin 2.5 mg x1 (PTA regimen) Monitor for s/sx of bleeding, daily INR, CBC Watch for new DDIs and changes in appetite  Thank you for allowing pharmacy to participate in this patient's care,  Morna Breach, PharmD PGY-1 Acute Care Pharmacy Resident 01/15/2024 6:46 AM

## 2024-01-15 NOTE — Progress Notes (Signed)
 Hypoglycemic Event  CBG: 51   Treatment: D50 25 mL (12.5 gm)  Symptoms: None  Follow-up CBG: Time:2237 CBG Result:117  Possible Reasons for Event: Inadequate meal intake and Medication regimen:    Comments/MD notified: Protocol Followed. [place], MD notified.     Amesha Bailey M Shenelle Klas

## 2024-01-15 NOTE — Progress Notes (Signed)
 Advanced Heart Failure Rounding Note  [facility] Cardiologist: Rolan Fuel, MD Chief Complaint: Cardiogenic Shock Subjective:    Moved to ICU on 6/11 for cardiogenic shock.   6/21  lasix  drip increased to 60 mg per hour and  Hypertonic Saline added.   Remains on Vaso 0.03, milrinone  0.125 mcg + Norepi 3 mcg + Lasix  drip 60mg  per hour .   Denies SOB.   Objective:    Vital Signs:   Temp:  [97.1 F (36.2 C)-98 F (36.7 C)] 98 F (36.7 C) (06/24 0700) Pulse Rate:  [80-189] 101 (06/24 0845) Resp:  [12-34] 18 (06/24 0845) BP: (67-143)/(38-95) 94/76 (06/24 0845) SpO2:  [86 %-100 %] 93 % (06/24 0845) Weight:  [84.7 kg] 84.7 kg (06/24 0545) Last BM Date : 01/14/24  Weight change: Filed Weights   01/13/24 0500 01/14/24 0500 01/15/24 0545  Weight: 82.1 kg 80.4 kg 84.7 kg   Intake/Output:  Intake/Output Summary (Last 24 hours) at 01/15/2024 0903 Last data filed at 01/15/2024 0700 Gross per 24 hour  Intake 3339.55 ml  Output 3550 ml  Net -210.45 ml   CVP 10-11  Physical Exam: General:   No resp difficulty Neck: supple. JVP 10-11  Cor: PMI nondisplaced. Regular rate & rhythm. No rubs, gallops or murmurs. Lungs: clear Abdomen: soft, nontender, nondistended.  Extremities: no cyanosis, clubbing, rash, R and LLE 1+ dema Neuro: alert & oriented x3  Labs: Basic Metabolic Panel: Recent Labs  Lab 01/09/24 0407 01/09/24 1418 01/10/24 0403 01/10/24 1416 01/11/24 0406 01/11/24 1205 01/13/24 0347 01/13/24 1651 01/14/24 0346 01/14/24 2128 01/15/24 0418  NA 123*   < > 122*   < > 122*   < > 123* 124* 126* 137 125*  K 3.5   < > 3.3*   < > 4.1   < > 3.6 3.7 3.6 3.4* 3.1*  CL 81*   < > 78*   < > 78*   < > 82* 78* 80* 90* 76*  CO2 28   < > 29   < > 29   < > 30 31 31  34* 32  GLUCOSE 161*   < > 161*   < > 142*   < > 97 212* 156* 131* 146*  BUN 52*   < > 49*   < > 53*   < > 67* 67* 66* 65* 65*  CREATININE 1.72*   < > 1.72*   < > 2.06*   < > 2.16* 2.12* 2.03* 1.64* 1.78*  CALCIUM 8.6*    < > 8.7*   < > 8.9   < > 8.6* 9.0 9.1 8.4* 8.8*  MG 2.4  --  2.3  --  2.6*  --  2.7*  --   --   --  2.8*   < > = values in this interval not displayed.   Liver Function Tests: Recent Labs  Lab 01/11/24 1205  AST 34  ALT 36  ALKPHOS 103  BILITOT 2.3*  PROT 6.0*  ALBUMIN 3.3*    CBC: Recent Labs  Lab 01/11/24 0406 01/12/24 0450 01/13/24 0347 01/14/24 0346 01/15/24 0418  WBC 16.0* 15.2* 14.4* 15.8* 15.4*  HGB 14.5 14.5 14.4 14.3 13.8  HCT 44.3 45.0 44.7 44.2 43.3  MCV 61.9* 61.6* 61.8* 61.9* 61.9*  PLT 174 182 177 169 183   Medications:    Scheduled Medications:  allopurinol   150 mg Oral Daily   amiodarone   400 mg Oral Daily   aspirin  EC  81 mg Oral Daily  Chlorhexidine  Gluconate Cloth  6 each Topical Daily   dapagliflozin  propanediol  10 mg Oral Daily   feeding supplement  237 mL Oral BID BM   fesoterodine   4 mg Oral QODAY   insulin  aspart  0-15 Units Subcutaneous TID WC   insulin  aspart  0-5 Units Subcutaneous QHS   potassium chloride   40 mEq Oral Daily   tamsulosin   0.4 mg Oral QODAY   Warfarin - Pharmacist Dosing Inpatient   Does not apply q1600   Infusions:  furosemide  (LASIX ) 200 mg in dextrose  5 % 100 mL (2 mg/mL) infusion 60 mg/hr (01/15/24 0700)   milrinone  0.125 mcg/kg/min (01/15/24 0700)   norepinephrine  (LEVOPHED ) Adult infusion 3 mcg/min (01/15/24 0700)   potassium chloride  10 mEq (01/15/24 0811)   vasopressin  0.03 Units/min (01/15/24 0700)   PRN Medications: ondansetron  (ZOFRAN ) IV, mouth rinse, traZODone   Assessment/Plan:   1. Acute on chronic systolic CHF -> cardiogenic shock:  - Coronary angiography in 1/23  mild CAD.  - Echo 01/03/24  EF 25-30% RV mod reduced - Has developed recurrent HF/shock in setting of recurrent AF.  Initial LA 2.1. Co-ox 41%  - CO-OX 76%. CVp 10 . Goal 7-8   - Continue milrinone  0.125 + Vaso 0.03, + Norepi-3 mcg. Continue  -CVP 10 today. At 1300 cut back lasix  to 30 mg per hour.    2. Mechanical aortic valve:  Continue warfarin and ASA 81. - INR 3.8  discussed with pharmacy. Re check at 1200   3. Paroxysmal Atrial fibrillation/atrial flutter:  - Admitted 1/18 with atrial fibrillation with RVR and CHF.  EF down to 20-25%.  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   - in cardiogenic shock in setting of recurrent AF.  - Failed DC-CV on 6/13 - Now in NSR, off amiodarone  gtt - Continue amiodarone  400 mg daily  4. Ascending aortic aneurysm: 4.3 cm on 4/24 MRA chest.   5. AKI on CKD2: Creatinine 2.14 on admit. Baseline 1.5. Suspect cardio-renal.  - Creatinine trending up 1.6>1.8    6. Hyperkalemia: resolved. Replace as needed   7. Shock liver - likely 2/2 low output/hepatic congestion   8. Hyponatremia -Sodium 126.  - limit FW, diuresis - Hypertonic saline as above  9. ILD - CT reviewed with P/CCM initially  Needs to get OOB.   Length of Stay: 14  CRITICAL CARE Performed by: Greig Mosses NP-C    Total critical care time: 15  minutes  Critical care time was exclusive of separately billable procedures and treating other patients.  Critical care was necessary to treat or prevent imminent or life-threatening deterioration.  Critical care was time spent personally by me on the following activities: development of treatment plan with patient and/or surrogate as well as nursing, discussions with consultants, evaluation of patient's response to treatment, examination of patient, obtaining history from patient or surrogate, ordering and performing treatments and interventions, ordering and review of laboratory studies, ordering and review of radiographic studies, pulse oximetry and re-evaluation of patient's condition.   Greig Mosses, NP 01/15/2024, 9:03 AM  Advanced Heart Failure Team Pager [phone] (M-F; 7a - 4p). For after hours, see Amion for on call HF MD

## 2024-01-15 NOTE — Plan of Care (Signed)
   Problem: Education: Goal: Knowledge of General Education information will improve Description: Including pain rating scale, medication(s)/side effects and non-pharmacologic comfort measures Outcome: Progressing   Problem: Clinical Measurements: Goal: Ability to maintain clinical measurements within normal limits will improve Outcome: Progressing

## 2024-01-16 DIAGNOSIS — R570 Cardiogenic shock: Secondary | ICD-10-CM | POA: Diagnosis not present

## 2024-01-16 LAB — RENAL FUNCTION PANEL
Albumin: 3.1 g/dL — ABNORMAL LOW (ref 3.5–5.0)
Albumin: 3.3 g/dL — ABNORMAL LOW (ref 3.5–5.0)
Anion gap: 12 (ref 5–15)
Anion gap: 17 — ABNORMAL HIGH (ref 5–15)
BUN: 61 mg/dL — ABNORMAL HIGH (ref 8–23)
BUN: 66 mg/dL — ABNORMAL HIGH (ref 8–23)
CO2: 39 mmol/L — ABNORMAL HIGH (ref 22–32)
CO2: 38 mmol/L — ABNORMAL HIGH (ref 22–32)
Calcium: 8.9 mg/dL (ref 8.9–10.3)
Calcium: 9.5 mg/dL (ref 8.9–10.3)
Chloride: 75 mmol/L — ABNORMAL LOW (ref 98–111)
Chloride: 72 mmol/L — ABNORMAL LOW (ref 98–111)
Creatinine, Ser: 1.67 mg/dL — ABNORMAL HIGH (ref 0.61–1.24)
Creatinine, Ser: 1.64 mg/dL — ABNORMAL HIGH (ref 0.61–1.24)
GFR, Estimated: 41 mL/min — ABNORMAL LOW (ref >60)
GFR, Estimated: 42 mL/min — ABNORMAL LOW (ref >60)
Glucose, Bld: 179 mg/dL — ABNORMAL HIGH (ref 70–99)
Glucose, Bld: 149 mg/dL — ABNORMAL HIGH (ref 70–99)
Phosphorus: 2.4 mg/dL — ABNORMAL LOW (ref 2.5–4.6)
Phosphorus: 3 mg/dL (ref 2.5–4.6)
Potassium: 4.2 mmol/L (ref 3.5–5.1)
Potassium: 4.3 mmol/L (ref 3.5–5.1)
Sodium: 126 mmol/L — ABNORMAL LOW (ref 135–145)
Sodium: 127 mmol/L — ABNORMAL LOW (ref 135–145)

## 2024-01-16 LAB — COOXEMETRY PANEL
Carboxyhemoglobin: 3.1 % — ABNORMAL HIGH (ref 0.5–1.5)
Methemoglobin: <0.7 % (ref 0.0–1.5)
O2 Saturation: 79.4 %
Total hemoglobin: 13.2 g/dL (ref 12.0–16.0)

## 2024-01-16 LAB — SODIUM
Sodium: 125 mmol/L — ABNORMAL LOW (ref 135–145)
Sodium: 128 mmol/L — ABNORMAL LOW (ref 135–145)

## 2024-01-16 LAB — BASIC METABOLIC PANEL WITH GFR
Anion gap: 14 (ref 5–15)
BUN: 62 mg/dL — ABNORMAL HIGH (ref 8–23)
CO2: 38 mmol/L — ABNORMAL HIGH (ref 22–32)
Calcium: 8.8 mg/dL — ABNORMAL LOW (ref 8.9–10.3)
Chloride: 74 mmol/L — ABNORMAL LOW (ref 98–111)
Creatinine, Ser: 1.66 mg/dL — ABNORMAL HIGH (ref 0.61–1.24)
GFR, Estimated: 41 mL/min — ABNORMAL LOW (ref >60)
Glucose, Bld: 125 mg/dL — ABNORMAL HIGH (ref 70–99)
Potassium: 2.8 mmol/L — ABNORMAL LOW (ref 3.5–5.1)
Sodium: 126 mmol/L — ABNORMAL LOW (ref 135–145)

## 2024-01-16 LAB — CULTURE, BLOOD (ROUTINE X 2)
Culture: NO GROWTH
Culture: NO GROWTH

## 2024-01-16 LAB — GLUCOSE, CAPILLARY
Glucose-Capillary: 137 mg/dL — ABNORMAL HIGH (ref 70–99)
Glucose-Capillary: 229 mg/dL — ABNORMAL HIGH (ref 70–99)
Glucose-Capillary: 220 mg/dL — ABNORMAL HIGH (ref 70–99)
Glucose-Capillary: 187 mg/dL — ABNORMAL HIGH (ref 70–99)

## 2024-01-16 LAB — PROTIME-INR
INR: 2.8 — ABNORMAL HIGH (ref 0.8–1.2)
Prothrombin Time: 29.8 s — ABNORMAL HIGH (ref 11.4–15.2)

## 2024-01-16 MED ORDER — POTASSIUM CHLORIDE 10 MEQ/50ML IV SOLN
10.0000 meq | INTRAVENOUS | Status: AC
  Administered 2024-01-16 (×6): 10 meq via INTRAVENOUS
  Filled 2024-01-16 (×6): qty 50

## 2024-01-16 MED ORDER — POTASSIUM CHLORIDE CRYS ER 20 MEQ PO TBCR
40.0000 meq | EXTENDED_RELEASE_TABLET | Freq: Once | ORAL | Status: AC
  Administered 2024-01-16: 40 meq via ORAL
  Filled 2024-01-16: qty 2

## 2024-01-16 MED ORDER — WARFARIN SODIUM 1 MG PO TABS
1.0000 mg | ORAL_TABLET | Freq: Once | ORAL | Status: AC
  Administered 2024-01-16: 1 mg via ORAL
  Filled 2024-01-16: qty 1

## 2024-01-16 MED ORDER — TOLVAPTAN 15 MG PO TABS
15.0000 mg | ORAL_TABLET | Freq: Once | ORAL | Status: DC
  Filled 2024-01-16: qty 1

## 2024-01-16 MED ORDER — TOLVAPTAN 15 MG PO TABS
15.0000 mg | ORAL_TABLET | Freq: Once | ORAL | Status: AC
  Administered 2024-01-16: 15 mg via ORAL
  Filled 2024-01-16: qty 1

## 2024-01-16 MED ORDER — INSULIN ASPART 100 UNIT/ML IJ SOLN: 0.0000 [IU] | Freq: Three times a day (TID) | INTRAMUSCULAR | Status: DC

## 2024-01-16 MED ORDER — INSULIN ASPART 100 UNIT/ML IJ SOLN
0.0000 [IU] | Freq: Three times a day (TID) | INTRAMUSCULAR | Status: DC
  Administered 2024-01-16: 5 [IU] via SUBCUTANEOUS
  Administered 2024-01-17: 3 [IU] via SUBCUTANEOUS
  Administered 2024-01-17: 5 [IU] via SUBCUTANEOUS
  Administered 2024-01-17: 3 [IU] via SUBCUTANEOUS
  Administered 2024-01-18: 5 [IU] via SUBCUTANEOUS
  Administered 2024-01-18: 2 [IU] via SUBCUTANEOUS
  Administered 2024-01-18 – 2024-01-19 (×3): 5 [IU] via SUBCUTANEOUS
  Administered 2024-01-19 – 2024-01-20 (×2): 3 [IU] via SUBCUTANEOUS
  Administered 2024-01-20 (×2): 2 [IU] via SUBCUTANEOUS
  Administered 2024-01-21: 3 [IU] via SUBCUTANEOUS
  Administered 2024-01-21: 5 [IU] via SUBCUTANEOUS
  Administered 2024-01-21: 3 [IU] via SUBCUTANEOUS
  Administered 2024-01-22 (×2): 5 [IU] via SUBCUTANEOUS
  Administered 2024-01-22: 3 [IU] via SUBCUTANEOUS
  Administered 2024-01-23: 5 [IU] via SUBCUTANEOUS
  Administered 2024-01-23: 3 [IU] via SUBCUTANEOUS
  Administered 2024-01-24: 5 [IU] via SUBCUTANEOUS
  Administered 2024-01-24 – 2024-01-25 (×2): 2 [IU] via SUBCUTANEOUS

## 2024-01-16 NOTE — Progress Notes (Signed)
 AM labs with hypoK (2.8) and hypoNa (126, stable past few days). Has required hypertonic saline the past several days (150 bl bolus bid). Ordered K 40 PO and 60 IV. Mg high the past several days.

## 2024-01-16 NOTE — Inpatient Diabetes Management (Addendum)
 Inpatient Diabetes Program Recommendations  AACE/ADA: New Consensus Statement on Inpatient Glycemic Control (2015)  Target Ranges:  Prepandial:   less than 140 mg/dL      Peak postprandial:   less than 180 mg/dL (1-2 hours)      Critically ill patients:  140 - 180 mg/dL   Lab Results  Component Value Date   GLUCAP 229 (H) 01/16/2024   HGBA1C 6.6 (H) 01/08/2024    Review of Glycemic Control  Latest Reference Range & Units 01/15/24 16:40 01/15/24 21:48 01/15/24 22:16 01/15/24 22:37 01/16/24 03:03 01/16/24 07:33  Glucose-Capillary 70 - 99 mg/dL 809 (H) 45 (L) 51 (L) 882 (H) 137 (H) 229 (H)   Diabetes history: DM2 Outpatient Diabetes medications:  Farxiga  10 mg daily Current orders for Inpatient glycemic control:  Novolog  0-15 units tid with meals and HS Farxiga  10 mg daily Inpatient Diabetes Program Recommendations:    Note low blood sugars yesterday after meal coverage 5 units tid with meals- Meal coverage now stopped.  If appropriate, consider adding Semglee 8 units daily due to CBG's>goal.   Thanks,  Randall Bullocks, RN, BC-ADM Inpatient Diabetes Coordinator Pager [phone]  (8a-5p)

## 2024-01-16 NOTE — Progress Notes (Signed)
 ANTICOAGULATION CONSULT NOTE  Pharmacy Consult for Warfarin Indication: afib with mechanical AVR  Allergies  Allergen Reactions   Celecoxib Rash    Other Reaction(s): Unknown    Patient Measurements: Height: 5' 10 (177.8 cm) Weight: 82.6 kg (182 lb 1.6 oz) IBW/kg (Calculated) : 73  Vital Signs: Temp: 97.6 F (36.4 C) (06/25 0300) Temp Source: Oral (06/25 0300) BP: 88/67 (06/25 0630) Pulse Rate: 89 (06/25 0630)  Labs: Recent Labs    01/14/24 0346 01/14/24 2128 01/15/24 0418 01/15/24 1200 01/15/24 1300 01/16/24 0304  HGB 14.3  --  13.8  --   --   --   HCT 44.2  --  43.3  --   --   --   PLT 169  --  183  --   --   --   LABPROT 29.4*  --  37.8*  --  29.6* 29.8*  INR 2.8*  --  3.8*  --  2.8* 2.8*  CREATININE 2.03*   < > 1.78* 1.74*  --  1.66*   < > = values in this interval not displayed.    Estimated Creatinine Clearance: 35.4 mL/min (A) (by C-G formula based on SCr of 1.66 mg/dL (H)).   Medical History: Past Medical History:  Diagnosis Date   Anticoagulant long-term use    Arthritis    probably in my thumbs (06/26/2012)   Bifascicular block    Chronic combined systolic and diastolic CHF (congestive heart failure) (HCC)    a. 01/2016 Echo: EF 45-50%, gr2 DD, inflat AK (poor acoustic windows);  b. 07/2016 Echo: EF 25-30%, mild LVH, PASP (pt in Afib).   Complication of anesthesia    wake up w/a start; hallucinations (06/26/2012)   Critical Aortic Stenosis    a. 03/2003 s/p SJM #25 mech prosthetic AoV;  b. 07/2016 Echo: EF 25-30% (in setting of AF), AoV area 1.72 cm^2 (VTI), 1.75 cm^2 (Vmean).   Diverticulosis    Gouty arthritis    have had it in both feet, ankles, right knee (06/26/2012)   History of diverticulitis of colon    History of kidney stones    History of pancreatitis    Hypertension    Incomplete RBBB    PAF (paroxysmal atrial fibrillation) (HCC)    a. CHA2DS2VASc = 4-->chronic coumadin  in setting of mech AoV;  b. 07/2016 Recurrent AF RVR.     Medications:  Medications Prior to Admission  Medication Sig Dispense Refill Last Dose/Taking   acetaminophen  (TYLENOL ) 500 MG tablet Take 500 mg by mouth 2 (two) times daily.   01/01/2024   albuterol  (VENTOLIN  HFA) 108 (90 Base) MCG/ACT inhaler Inhale 2 puffs into the lungs every 4 (four) hours.   01/01/2024   allopurinol  (ZYLOPRIM ) 300 MG tablet Take 150 mg by mouth daily.   01/01/2024   aspirin  EC 81 MG tablet Take 81 mg by mouth daily. Swallow whole.   01/01/2024   clobetasol cream (TEMOVATE) 0.05 % Apply 1 Application topically 2 (two) times daily.   01/01/2024   colchicine  0.6 MG tablet Take 1 tablet (0.6 mg total) by mouth daily as needed (for gout).   Unknown   FARXIGA  10 MG TABS tablet TAKE 1 TABLET BY MOUTH EVERY DAY 30 tablet 6 01/01/2024   FIBER PO Take 2 capsules by mouth 2 (two) times daily.   01/01/2024   folic acid  (FOLVITE ) 1 MG tablet TAKE 1 TABLET(1 MG) BY MOUTH DAILY 90 tablet 3 01/01/2024   metoprolol  succinate (TOPROL -XL) 25 MG 24 hr tablet  TAKE 1 TABLET BY MOUTH EVERYDAY AT BEDTIME 90 tablet 1 12/31/2023   potassium chloride  SA (KLOR-CON  M) 20 MEQ tablet Take 1 tablet (20 mEq total) by mouth daily.   01/01/2024   tamsulosin  (FLOMAX ) 0.4 MG CAPS capsule Take 1 capsule (0.4 mg total) by mouth every other day. 90 capsule 3 01/01/2024   tolterodine  (DETROL  LA) 4 MG 24 hr capsule Take 1 capsule (4 mg total) by mouth every other day. Alternating days with the tamsulosin  90 capsule 3 12/31/2023   torsemide  (DEMADEX ) 20 MG tablet Take 3 tablets (60 mg total) by mouth daily.   01/01/2024   warfarin (COUMADIN ) 5 MG tablet Take 0.5-1 tablets (2.5-5 mg total) by mouth See admin instructions. 1/19 and 1/20  take 7.5mg  (1 and 1/2 tab) then take 1 tab (5mg ) daily until seen by MD  Take 1 tablet (5 mg) by mouth on Sundays, Mondays, Wednesdays, Thursdays & Saturdays in the evening. Take 0.5 tablet (2.5 mg) by mouth on Tuesdays & Fridays in the evening. (Patient taking differently: Take 2.5-5 mg by  mouth See admin instructions. AS directed by PCP 2.5 mg Daily) 45 tablet 11 12/31/2023 at  8:00 PM   docusate sodium  (COLACE) 100 MG capsule Take 100 mg by mouth 2 (two) times daily. (Patient not taking: Reported on 01/01/2024)   Not Taking   polyethylene glycol powder (GLYCOLAX /MIRALAX ) 17 GM/SCOOP powder Take 17 g by mouth 2 (two) times daily as needed for moderate constipation or mild constipation. (Patient not taking: Reported on 12/26/2023) 238 g 2    Scheduled:   allopurinol   150 mg Oral Daily   amiodarone   400 mg Oral Daily   aspirin  EC  81 mg Oral Daily   Chlorhexidine  Gluconate Cloth  6 each Topical Daily   dapagliflozin  propanediol  10 mg Oral Daily   feeding supplement  237 mL Oral BID BM   fesoterodine   4 mg Oral QODAY   insulin  aspart  0-15 Units Subcutaneous TID WC   potassium chloride   40 mEq Oral Daily   tamsulosin   0.4 mg Oral QODAY   Warfarin - Pharmacist Dosing Inpatient   Does not apply q1600   Infusions:   furosemide  (LASIX ) 200 mg in dextrose  5 % 100 mL (2 mg/mL) infusion 30 mg/hr (01/16/24 0600)   milrinone  0.125 mcg/kg/min (01/16/24 0600)   norepinephrine  (LEVOPHED ) Adult infusion 4 mcg/min (01/16/24 0600)   potassium chloride  10 mEq (01/16/24 0616)   vasopressin  0.03 Units/min (01/16/24 0600)   PRN: ondansetron  (ZOFRAN ) IV, mouth rinse, traZODone   Assessment: 83 yo male presenting with worsening shortness of breath. On warfarin PTA for afib with mechanical AVR.    Per patient, dose is 5 mg daily, poor historian. Upon chart review patient should be on 2.5 mg once daily on Monday, Tuesday, Thursday, Friday, and Sunday and 1 mg on Wednesday and Saturday per last PCP note on 12/18/2023. Goal INR is 2.5-3.5.   Pertinent DDI's - s/p amio IV (none PTA, 6/12 >20), PO amio (6/20 >), allopurinol  (PTA). Amiodarone  PO dose increased from 200mg  daily to 400mg  daily today (6/23 >). Oral intake documented at 75%, also receiving nutritional supplements (ensure plus high  protein).  6/25 AM: INR 2.8, therapeutic on PTA regimen (1 mg Wednesday, Saturday and 2.5 mg EOD). CBC stable (Hgb 13.8, PLT 183). No bleeding noted. Noted amiodarone  was increased from 200 mg to 400 mg daily on 6/23. Oral intake documented at 25%, also receiving nutritional supplements.    Goal of Therapy:  INR 2.5-3.5 Monitor platelets by anticoagulation protocol: Yes   Plan:  Warfarin 1 mg x1 (PTA regimen) Monitor for s/sx of bleeding, daily INR, CBC Watch for new DDIs and changes in appetite  Thank you for allowing pharmacy to participate in this patient's care,  Morna Breach, PharmD PGY-1 Acute Care Pharmacy Resident 01/16/2024 6:44 AM

## 2024-01-16 NOTE — Progress Notes (Signed)
 Advanced Heart Failure Rounding Note  [facility] Cardiologist: Rolan Fuel, MD Chief Complaint: Cardiogenic Shock Subjective:    Moved to ICU on 6/11 for cardiogenic shock.   6/21  lasix  drip increased to 60 mg per hour and  Hypertonic Saline added.   Remains on Vaso 0.03, milrinone  0.125 mcg + Norepi 4  mcg + Lasix  drip  30 mg per hour .   Denies SOB.   Objective:    Vital Signs:   Temp:  [97.3 F (36.3 C)-98.3 F (36.8 C)] 97.3 F (36.3 C) (06/25 0730) Pulse Rate:  [85-126] 90 (06/25 0730) Resp:  [11-30] 16 (06/25 0730) BP: (75-96)/(48-77) 90/65 (06/25 0730) SpO2:  [79 %-100 %] 96 % (06/25 0730) Weight:  [82.6 kg] 82.6 kg (06/25 0600) Last BM Date : 01/14/24  Weight change: Filed Weights   01/14/24 0500 01/15/24 0545 01/16/24 0600  Weight: 80.4 kg 84.7 kg 82.6 kg   Intake/Output:  Intake/Output Summary (Last 24 hours) at 01/16/2024 0815 Last data filed at 01/16/2024 0700 Gross per 24 hour  Intake 2854.2 ml  Output 4750 ml  Net -1895.8 ml   CVP 11  General:   No resp difficulty Neck: supple. 10-11   Cor: PMI nondisplaced. Regular rate & rhythm. No rubs, gallops or murmurs. Lungs: clear Abdomen: soft, nontender, nondistended.  Extremities: no cyanosis, clubbing, rash, R and LLE 1+ edema Neuro: alert & oriented x3   Labs: Basic Metabolic Panel: Recent Labs  Lab 01/10/24 0403 01/10/24 1416 01/11/24 0406 01/11/24 1205 01/13/24 0347 01/13/24 1651 01/14/24 0346 01/14/24 2128 01/15/24 0418 01/15/24 1200 01/16/24 0304  NA 122*   < > 122*   < > 123*   < > 126* 137 125* 126* 126*  K 3.3*   < > 4.1   < > 3.6   < > 3.6 3.4* 3.1* 4.3 2.8*  CL 78*   < > 78*   < > 82*   < > 80* 90* 76* 76* 74*  CO2 29   < > 29   < > 30   < > 31 34* 32 35* 38*  GLUCOSE 161*   < > 142*   < > 97   < > 156* 131* 146* 251* 125*  BUN 49*   < > 53*   < > 67*   < > 66* 65* 65* 63* 62*  CREATININE 1.72*   < > 2.06*   < > 2.16*   < > 2.03* 1.64* 1.78* 1.74* 1.66*  CALCIUM 8.7*   < > 8.9   <  > 8.6*   < > 9.1 8.4* 8.8* 9.1 8.8*  MG 2.3  --  2.6*  --  2.7*  --   --   --  2.8*  --   --   PHOS  --   --   --   --   --   --   --   --   --  3.0  --    < > = values in this interval not displayed.   Liver Function Tests: Recent Labs  Lab 01/11/24 1205 01/15/24 1200  AST 34  --   ALT 36  --   ALKPHOS 103  --   BILITOT 2.3*  --   PROT 6.0*  --   ALBUMIN 3.3* 3.3*    CBC: Recent Labs  Lab 01/11/24 0406 01/12/24 0450 01/13/24 0347 01/14/24 0346 01/15/24 0418  WBC 16.0* 15.2* 14.4* 15.8* 15.4*  HGB 14.5 14.5 14.4 14.3 13.8  HCT 44.3 45.0 44.7 44.2 43.3  MCV 61.9* 61.6* 61.8* 61.9* 61.9*  PLT 174 182 177 169 183   Medications:    Scheduled Medications:  allopurinol   150 mg Oral Daily   amiodarone   400 mg Oral Daily   aspirin  EC  81 mg Oral Daily   Chlorhexidine  Gluconate Cloth  6 each Topical Daily   dapagliflozin  propanediol  10 mg Oral Daily   feeding supplement  237 mL Oral BID BM   fesoterodine   4 mg Oral QODAY   insulin  aspart  0-15 Units Subcutaneous TID WC   potassium chloride   40 mEq Oral Daily   tamsulosin   0.4 mg Oral QODAY   Warfarin - Pharmacist Dosing Inpatient   Does not apply q1600   Infusions:  furosemide  (LASIX ) 200 mg in dextrose  5 % 100 mL (2 mg/mL) infusion 30 mg/hr (01/16/24 0736)   milrinone  0.125 mcg/kg/min (01/16/24 0700)   norepinephrine  (LEVOPHED ) Adult infusion 4 mcg/min (01/16/24 0700)   potassium chloride  10 mEq (01/16/24 0735)   vasopressin  0.03 Units/min (01/16/24 0700)   PRN Medications: ondansetron  (ZOFRAN ) IV, mouth rinse, traZODone   Assessment/Plan:   1. Acute on chronic systolic CHF -> cardiogenic shock:  - Coronary angiography in 1/23  mild CAD.  - Echo 01/03/24  EF 25-30% RV mod reduced - Has developed recurrent HF/shock in setting of recurrent AF.  Initial LA 2.1. Co-ox 41%  -COOX stable  - Continue milrinone  0.125 + Vaso 0.03, + Norepi-4 mcg. -CVP 10-11 Tolvaptan 15 mg daily Stop Hypertonic saline   2.  Mechanical aortic valve: Continue warfarin and ASA 81. - INR 2.8  discussed with pharmacy. Re check at 1200   3. Paroxysmal Atrial fibrillation/atrial flutter:  - Admitted 1/18 with atrial fibrillation with RVR and CHF.  EF down to 20-25%.  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   - in cardiogenic shock in setting of recurrent AF.  - Failed DC-CV on 6/13 - Maintaining  SR. Now in NSR, off amiodarone  gtt - Continue amiodarone  400 mg daily - Continue coumadin .   4. Ascending aortic aneurysm: 4.3 cm on 4/24 MRA chest.   5. AKI on CKD2: Creatinine 2.14 on admit. Baseline 1.5. Suspect cardio-renal.  - Creatinine trending up 1.6   6. Hyperkalemia: resolved.   7. Shock liver - likely 2/2 low output/hepatic congestion   8. Hyponatremia -Sodium 126.  - limit FW, diuresis -- Give 15 mg Tolvaptan. Stop hypertonic saline.   9. ILD - CT reviewed with P/CCM initially  10. Hypokalemia  K < 3. Aggressive replace.   OOB>   Length of Stay: 15  CRITICAL CARE Performed by: Greig Mosses NP-C    Total critical care time: 15  minutes  Critical care time was exclusive of separately billable procedures and treating other patients.  Critical care was necessary to treat or prevent imminent or life-threatening deterioration.  Critical care was time spent personally by me on the following activities: development of treatment plan with patient and/or surrogate as well as nursing, discussions with consultants, evaluation of patient's response to treatment, examination of patient, obtaining history from patient or surrogate, ordering and performing treatments and interventions, ordering and review of laboratory studies, ordering and review of radiographic studies, pulse oximetry and re-evaluation of patient's condition.   Greig Mosses, NP 01/16/2024, 8:15 AM  Advanced Heart Failure Team Pager [phone] (M-F; 7a - 4p). For after hours, see Amion for on call HF MD

## 2024-01-16 NOTE — Progress Notes (Signed)
   Inpatient Rehabilitation Admissions Coordinator   We follow at a distance as medical issues ongoing.  Heron Leavell, RN, MSN Rehab Admissions Coordinator [phone] 01/16/2024 10:39 AM

## 2024-01-17 DIAGNOSIS — R570 Cardiogenic shock: Secondary | ICD-10-CM | POA: Diagnosis not present

## 2024-01-17 LAB — MAGNESIUM: Magnesium: 2.4 mg/dL (ref 1.7–2.4)

## 2024-01-17 LAB — CBC
HCT: 42.4 % (ref 39.0–52.0)
Hemoglobin: 13.7 g/dL (ref 13.0–17.0)
MCH: 20.1 pg — ABNORMAL LOW (ref 26.0–34.0)
MCHC: 32.3 g/dL (ref 30.0–36.0)
MCV: 62.3 fL — ABNORMAL LOW (ref 80.0–100.0)
Platelets: 197 10*3/uL (ref 150–400)
RBC: 6.81 MIL/uL — ABNORMAL HIGH (ref 4.22–5.81)
RDW: 19 % — ABNORMAL HIGH (ref 11.5–15.5)
WBC: 16.2 10*3/uL — ABNORMAL HIGH (ref 4.0–10.5)
nRBC: 2.4 % — ABNORMAL HIGH (ref 0.0–0.2)

## 2024-01-17 LAB — BASIC METABOLIC PANEL WITH GFR
Anion gap: 10 (ref 5–15)
Anion gap: 15 (ref 5–15)
BUN: 64 mg/dL — ABNORMAL HIGH (ref 8–23)
BUN: 63 mg/dL — ABNORMAL HIGH (ref 8–23)
CO2: 40 mmol/L — ABNORMAL HIGH (ref 22–32)
CO2: 37 mmol/L — ABNORMAL HIGH (ref 22–32)
Calcium: 8.9 mg/dL (ref 8.9–10.3)
Calcium: 9 mg/dL (ref 8.9–10.3)
Chloride: 73 mmol/L — ABNORMAL LOW (ref 98–111)
Chloride: 72 mmol/L — ABNORMAL LOW (ref 98–111)
Creatinine, Ser: 1.65 mg/dL — ABNORMAL HIGH (ref 0.61–1.24)
Creatinine, Ser: 1.7 mg/dL — ABNORMAL HIGH (ref 0.61–1.24)
GFR, Estimated: 41 mL/min — ABNORMAL LOW (ref >60)
GFR, Estimated: 40 mL/min — ABNORMAL LOW (ref >60)
Glucose, Bld: 154 mg/dL — ABNORMAL HIGH (ref 70–99)
Glucose, Bld: 210 mg/dL — ABNORMAL HIGH (ref 70–99)
Potassium: 3.1 mmol/L — ABNORMAL LOW (ref 3.5–5.1)
Potassium: 3.3 mmol/L — ABNORMAL LOW (ref 3.5–5.1)
Sodium: 123 mmol/L — ABNORMAL LOW (ref 135–145)
Sodium: 124 mmol/L — ABNORMAL LOW (ref 135–145)

## 2024-01-17 LAB — HEPATIC FUNCTION PANEL
ALT: 20 U/L (ref 0–44)
AST: 28 U/L (ref 15–41)
Albumin: 3.2 g/dL — ABNORMAL LOW (ref 3.5–5.0)
Alkaline Phosphatase: 113 U/L (ref 38–126)
Bilirubin, Direct: 1.1 mg/dL — ABNORMAL HIGH (ref 0.0–0.2)
Indirect Bilirubin: 1.9 mg/dL — ABNORMAL HIGH (ref 0.3–0.9)
Total Bilirubin: 3 mg/dL — ABNORMAL HIGH (ref 0.0–1.2)
Total Protein: 5.7 g/dL — ABNORMAL LOW (ref 6.5–8.1)

## 2024-01-17 LAB — RENAL FUNCTION PANEL
Albumin: 3.2 g/dL — ABNORMAL LOW (ref 3.5–5.0)
Anion gap: 15 (ref 5–15)
BUN: 67 mg/dL — ABNORMAL HIGH (ref 8–23)
CO2: 38 mmol/L — ABNORMAL HIGH (ref 22–32)
Calcium: 9.3 mg/dL (ref 8.9–10.3)
Chloride: 74 mmol/L — ABNORMAL LOW (ref 98–111)
Creatinine, Ser: 1.72 mg/dL — ABNORMAL HIGH (ref 0.61–1.24)
GFR, Estimated: 39 mL/min — ABNORMAL LOW (ref >60)
Glucose, Bld: 93 mg/dL (ref 70–99)
Phosphorus: 3.3 mg/dL (ref 2.5–4.6)
Potassium: 4.8 mmol/L (ref 3.5–5.1)
Sodium: 127 mmol/L — ABNORMAL LOW (ref 135–145)

## 2024-01-17 LAB — GLUCOSE, CAPILLARY
Glucose-Capillary: 113 mg/dL — ABNORMAL HIGH (ref 70–99)
Glucose-Capillary: 178 mg/dL — ABNORMAL HIGH (ref 70–99)
Glucose-Capillary: 169 mg/dL — ABNORMAL HIGH (ref 70–99)
Glucose-Capillary: 203 mg/dL — ABNORMAL HIGH (ref 70–99)
Glucose-Capillary: 157 mg/dL — ABNORMAL HIGH (ref 70–99)
Glucose-Capillary: 196 mg/dL — ABNORMAL HIGH (ref 70–99)

## 2024-01-17 LAB — PROTIME-INR
INR: 2.5 — ABNORMAL HIGH (ref 0.8–1.2)
Prothrombin Time: 28.5 s — ABNORMAL HIGH (ref 11.4–15.2)

## 2024-01-17 LAB — COOXEMETRY PANEL
Carboxyhemoglobin: 1.9 % — ABNORMAL HIGH (ref 0.5–1.5)
Methemoglobin: 0.8 % (ref 0.0–1.5)
O2 Saturation: 57 %
Total hemoglobin: 14.4 g/dL (ref 12.0–16.0)

## 2024-01-17 LAB — SODIUM: Sodium: 125 mmol/L — ABNORMAL LOW (ref 135–145)

## 2024-01-17 MED ORDER — BUMETANIDE 2 MG PO TABS
3.0000 mg | ORAL_TABLET | Freq: Once | ORAL | Status: AC
  Administered 2024-01-17: 3 mg via ORAL
  Filled 2024-01-17: qty 1

## 2024-01-17 MED ORDER — POTASSIUM CHLORIDE 10 MEQ/50ML IV SOLN
10.0000 meq | INTRAVENOUS | Status: AC
  Administered 2024-01-17 (×6): 10 meq via INTRAVENOUS
  Filled 2024-01-17 (×6): qty 50

## 2024-01-17 MED ORDER — POTASSIUM CHLORIDE CRYS ER 20 MEQ PO TBCR
40.0000 meq | EXTENDED_RELEASE_TABLET | Freq: Once | ORAL | Status: AC
  Administered 2024-01-17: 40 meq via ORAL
  Filled 2024-01-17: qty 2

## 2024-01-17 MED ORDER — ACETAZOLAMIDE 250 MG PO TABS
500.0000 mg | ORAL_TABLET | Freq: Once | ORAL | Status: AC
  Administered 2024-01-17: 500 mg via ORAL
  Filled 2024-01-17: qty 2

## 2024-01-17 MED ORDER — TOLVAPTAN 15 MG PO TABS
30.0000 mg | ORAL_TABLET | Freq: Once | ORAL | Status: AC
  Administered 2024-01-17: 30 mg via ORAL
  Filled 2024-01-17: qty 2

## 2024-01-17 MED ORDER — WARFARIN SODIUM 2.5 MG PO TABS
2.5000 mg | ORAL_TABLET | Freq: Once | ORAL | Status: AC
  Administered 2024-01-17: 2.5 mg via ORAL
  Filled 2024-01-17: qty 1

## 2024-01-17 NOTE — Progress Notes (Signed)
 ANTICOAGULATION CONSULT NOTE  Pharmacy Consult for Warfarin Indication: afib with mechanical AVR  Allergies  Allergen Reactions   Celecoxib Rash    Other Reaction(s): Unknown    Patient Measurements: Height: 5' 10 (177.8 cm) Weight: 89.7 kg (197 lb 12 oz) IBW/kg (Calculated) : 73  Vital Signs: Temp: 98.2 F (36.8 C) (06/26 0400) Temp Source: Axillary (06/26 0400) BP: 94/73 (06/26 0630) Pulse Rate: 101 (06/26 0630)  Labs: Recent Labs    01/15/24 0418 01/15/24 1200 01/15/24 1300 01/16/24 0304 01/16/24 1133 01/16/24 2146 01/17/24 0538  HGB 13.8  --   --   --   --   --   --   HCT 43.3  --   --   --   --   --   --   PLT 183  --   --   --   --   --   --   LABPROT 37.8*  --  29.6* 29.8*  --   --  28.5*  INR 3.8*  --  2.8* 2.8*  --   --  2.5*  CREATININE 1.78*   < >  --  1.66* 1.67* 1.64* 1.65*   < > = values in this interval not displayed.    Estimated Creatinine Clearance: 38.9 mL/min (A) (by C-G formula based on SCr of 1.65 mg/dL (H)).   Medical History: Past Medical History:  Diagnosis Date   Anticoagulant long-term use    Arthritis    probably in my thumbs (06/26/2012)   Bifascicular block    Chronic combined systolic and diastolic CHF (congestive heart failure) (HCC)    a. 01/2016 Echo: EF 45-50%, gr2 DD, inflat AK (poor acoustic windows);  b. 07/2016 Echo: EF 25-30%, mild LVH, PASP (pt in Afib).   Complication of anesthesia    wake up w/a start; hallucinations (06/26/2012)   Critical Aortic Stenosis    a. 03/2003 s/p SJM #25 mech prosthetic AoV;  b. 07/2016 Echo: EF 25-30% (in setting of AF), AoV area 1.72 cm^2 (VTI), 1.75 cm^2 (Vmean).   Diverticulosis    Gouty arthritis    have had it in both feet, ankles, right knee (06/26/2012)   History of diverticulitis of colon    History of kidney stones    History of pancreatitis    Hypertension    Incomplete RBBB    PAF (paroxysmal atrial fibrillation) (HCC)    a. CHA2DS2VASc = 4-->chronic coumadin  in  setting of mech AoV;  b. 07/2016 Recurrent AF RVR.    Medications:  Medications Prior to Admission  Medication Sig Dispense Refill Last Dose/Taking   acetaminophen  (TYLENOL ) 500 MG tablet Take 500 mg by mouth 2 (two) times daily.   01/01/2024   albuterol  (VENTOLIN  HFA) 108 (90 Base) MCG/ACT inhaler Inhale 2 puffs into the lungs every 4 (four) hours.   01/01/2024   allopurinol  (ZYLOPRIM ) 300 MG tablet Take 150 mg by mouth daily.   01/01/2024   aspirin  EC 81 MG tablet Take 81 mg by mouth daily. Swallow whole.   01/01/2024   clobetasol cream (TEMOVATE) 0.05 % Apply 1 Application topically 2 (two) times daily.   01/01/2024   colchicine  0.6 MG tablet Take 1 tablet (0.6 mg total) by mouth daily as needed (for gout).   Unknown   FARXIGA  10 MG TABS tablet TAKE 1 TABLET BY MOUTH EVERY DAY 30 tablet 6 01/01/2024   FIBER PO Take 2 capsules by mouth 2 (two) times daily.   01/01/2024   folic acid  (FOLVITE )  1 MG tablet TAKE 1 TABLET(1 MG) BY MOUTH DAILY 90 tablet 3 01/01/2024   metoprolol  succinate (TOPROL -XL) 25 MG 24 hr tablet TAKE 1 TABLET BY MOUTH EVERYDAY AT BEDTIME 90 tablet 1 12/31/2023   potassium chloride  SA (KLOR-CON  M) 20 MEQ tablet Take 1 tablet (20 mEq total) by mouth daily.   01/01/2024   tamsulosin  (FLOMAX ) 0.4 MG CAPS capsule Take 1 capsule (0.4 mg total) by mouth every other day. 90 capsule 3 01/01/2024   tolterodine  (DETROL  LA) 4 MG 24 hr capsule Take 1 capsule (4 mg total) by mouth every other day. Alternating days with the tamsulosin  90 capsule 3 12/31/2023   torsemide  (DEMADEX ) 20 MG tablet Take 3 tablets (60 mg total) by mouth daily.   01/01/2024   warfarin (COUMADIN ) 5 MG tablet Take 0.5-1 tablets (2.5-5 mg total) by mouth See admin instructions. 1/19 and 1/20  take 7.5mg  (1 and 1/2 tab) then take 1 tab (5mg ) daily until seen by MD  Take 1 tablet (5 mg) by mouth on Sundays, Mondays, Wednesdays, Thursdays & Saturdays in the evening. Take 0.5 tablet (2.5 mg) by mouth on Tuesdays & Fridays in the  evening. (Patient taking differently: Take 2.5-5 mg by mouth See admin instructions. AS directed by PCP 2.5 mg Daily) 45 tablet 11 12/31/2023 at  8:00 PM   docusate sodium  (COLACE) 100 MG capsule Take 100 mg by mouth 2 (two) times daily. (Patient not taking: Reported on 01/01/2024)   Not Taking   polyethylene glycol powder (GLYCOLAX /MIRALAX ) 17 GM/SCOOP powder Take 17 g by mouth 2 (two) times daily as needed for moderate constipation or mild constipation. (Patient not taking: Reported on 12/26/2023) 238 g 2    Scheduled:   allopurinol   150 mg Oral Daily   amiodarone   400 mg Oral Daily   aspirin  EC  81 mg Oral Daily   Chlorhexidine  Gluconate Cloth  6 each Topical Daily   dapagliflozin  propanediol  10 mg Oral Daily   feeding supplement  237 mL Oral BID BM   fesoterodine   4 mg Oral QODAY   insulin  aspart  0-15 Units Subcutaneous TID WC   potassium chloride   40 mEq Oral Daily   potassium chloride   40 mEq Oral Once   tamsulosin   0.4 mg Oral QODAY   Warfarin - Pharmacist Dosing Inpatient   Does not apply q1600   Infusions:   furosemide  (LASIX ) 200 mg in dextrose  5 % 100 mL (2 mg/mL) infusion 30 mg/hr (01/17/24 0600)   milrinone  0.125 mcg/kg/min (01/17/24 0600)   norepinephrine  (LEVOPHED ) Adult infusion 4 mcg/min (01/17/24 0600)   potassium chloride      vasopressin  0.03 Units/min (01/17/24 0600)   PRN: ondansetron  (ZOFRAN ) IV, mouth rinse, traZODone   Assessment: 83 yo male presenting with worsening shortness of breath. On warfarin PTA for afib with mechanical AVR.    Per patient, dose is 5 mg daily, poor historian. Upon chart review patient should be on 2.5 mg once daily on Monday, Tuesday, Thursday, Friday, and Sunday and 1 mg on Wednesday and Saturday per last PCP note on 12/18/2023. Goal INR is 2.5-3.5.   Pertinent DDI's - s/p amio IV (none PTA, 6/12 >20), PO amio (6/20 >), allopurinol  (PTA). Amiodarone  PO dose increased from 200mg  daily to 400mg  daily today (6/23 >). Oral intake documented at  75%, also receiving nutritional supplements (ensure plus high protein).  6/25 AM: INR 2.5, therapeutic on PTA regimen (1 mg Wednesday, Saturday and 2.5 mg EOD). CBC stable (Hgb 13.8, PLT 183). No  bleeding noted. Noted amiodarone  was increased from 200 mg to 400 mg daily on 6/23. Oral intake documented at 50%, also receiving nutritional supplements.   Goal of Therapy:  INR 2.5-3.5 Monitor platelets by anticoagulation protocol: Yes   Plan:  Warfarin 2.5 mg x1 (PTA regimen) Monitor for s/sx of bleeding, daily INR, CBC Watch for new DDIs and changes in appetite  Thank you for allowing pharmacy to participate in this patient's care,  Morna Breach, PharmD PGY-1 Acute Care Pharmacy Resident 01/17/2024 6:55 AM

## 2024-01-17 NOTE — Progress Notes (Signed)
 Physical Therapy Treatment Patient Details Name: Adrian Lambert MRN: [id number] DOB: [birth year] Today's Date: 01/17/2024   History of Present Illness Pt is 83 yo presenting to [facility] ED on 6/10 due to shortness of breath Developed cardiogenic shock, progressive hypotension and signs of hyperperfusion. Admitted to ICU for inotropes. PMH: arthritis, systolic and diastolic CHF, diverticulosis, HTN, incomplete RBBB, PAF    PT Comments  Goals updated as appropriate. Making gradual progress towards functional goals. Max assist to stand from recliner x2, and step pivot transfer. +2 assist to manage lines/leads. Knees and hips flexed a moderate amount in standing, even with multimodal cues and additional techniques to improve posture for efficiency. LEs buckling lightly, posterior lean throughout, difficulty sequencing, requiring physical assist to weight shift, move LE in desired direction, and manipulate RW for pt. Reviewed LE exercises. BP 79/62 (MAP 69) in recliner.) SPO2 94% on 2L supplemental O2. Patient will continue to benefit from skilled physical therapy services to further improve independence with functional mobility.     If plan is discharge home, recommend the following: Help with stairs or ramp for entrance;Assist for transportation;Assistance with cooking/housework;Two people to help with walking and/or transfers   Can travel by private vehicle        Equipment Recommendations  None recommended by PT    Recommendations for Other Services       Precautions / Restrictions Precautions Precautions: Fall Recall of Precautions/Restrictions: Intact Precaution/Restrictions Comments: watch O2 sats, HR, BP Restrictions Weight Bearing Restrictions Per Provider Order: No     Mobility  Bed Mobility Overal bed mobility: Needs Assistance Bed Mobility: Sit to Sidelying, Rolling Rolling: Min assist       Sit to sidelying: Min assist, Used rails General bed mobility comments: Min assist  with heavy VC to lie down on Lt side, use of rail to maintain on side, LE support into bed and to roll. Rolled several times with cues for technique and min assist to pull through UEs.    Transfers Overall transfer level: Needs assistance Equipment used: Rolling walker (2 wheels) Transfers: Sit to/from Stand, Bed to chair/wheelchair/BSC Sit to Stand: Max assist, +2 safety/equipment   Step pivot transfers: Max assist, +2 safety/equipment       General transfer comment: Max assist for boost to stand x2 from recliner. Cues for set-up, hand and foot placement prior to rising with poor recall. Very slow and weak with rise and transition to BIL UEs on RW with notable posterior lean, bil knees and hips flexed in standing with poor corrective abilities despite multimodal cues. Max assist for balance, sequencing, and walker control for step pivot to bed from recliner. Frequent buckling, posterior lean. +2 assist with nursing to help with lines/leads.    Ambulation/Gait                   Stairs             Wheelchair Mobility     Tilt Bed    Modified Rankin (Stroke Patients Only)       Balance Overall balance assessment: Needs assistance Sitting-balance support: Feet supported, No upper extremity supported Sitting balance-Leahy Scale: Fair Sitting balance - Comments: CGA EOB   Standing balance support: Bilateral upper extremity supported, Reliant on assistive device for balance, During functional activity Standing balance-Leahy Scale: Poor Standing balance comment: BIL UEs and mod assist to balance  Communication Communication Communication: Impaired Factors Affecting Communication: Reduced clarity of speech  Cognition Arousal: Alert Behavior During Therapy: WFL for tasks assessed/performed   PT - Cognitive impairments: No family/caregiver present to determine baseline                         Following commands:  Intact      Cueing Cueing Techniques: Verbal cues  Exercises General Exercises - Lower Extremity Ankle Circles/Pumps: AROM, Both, 10 reps, Supine Quad Sets: AROM, Both, 10 reps, Supine Gluteal Sets: Strengthening, Both, 10 reps, Supine Short Arc Quad: Strengthening, Both, 10 reps, Supine    General Comments General comments (skin integrity, edema, etc.): BP 79/62 (MAP 69) HR 81, SpO2 94% on 2L supplemental O2.      Pertinent Vitals/Pain Pain Assessment Pain Assessment: No/denies pain    Home Living                          Prior Function            PT Goals (current goals can now be found in the care plan section) Acute Rehab PT Goals Patient Stated Goal: to return home soon PT Goal Formulation: With patient Time For Goal Achievement: 01/31/24 Potential to Achieve Goals: Good Progress towards PT goals: Progressing toward goals    Frequency    Min 2X/week      PT Plan      Co-evaluation              AM-PAC PT 6 Clicks Mobility   Outcome Measure  Help needed turning from your back to your side while in a flat bed without using bedrails?: A Little Help needed moving from lying on your back to sitting on the side of a flat bed without using bedrails?: A Lot Help needed moving to and from a bed to a chair (including a wheelchair)?: Total Help needed standing up from a chair using your arms (e.g., wheelchair or bedside chair)?: Total Help needed to walk in hospital room?: Total Help needed climbing 3-5 steps with a railing? : Total 6 Click Score: 9    End of Session Equipment Utilized During Treatment: Gait belt Activity Tolerance: Patient limited by fatigue (weakness) Patient left: in bed;with nursing/sitter in room Nurse Communication: Mobility status;Need for lift equipment (recommend stedy) PT Visit Diagnosis: Unsteadiness on feet (R26.81);Muscle weakness (generalized) (M62.81)     Time: 8670-8651 PT Time Calculation (min) (ACUTE  ONLY): 19 min  Charges:    $Therapeutic Activity: 8-22 mins PT General Charges $$ ACUTE PT VISIT: 1 Visit                     Leontine Roads, PT, DPT [facility] Services Physical Therapist Office: [phone] Website: [email or web address]    Leontine GORMAN Roads 01/17/2024, 2:05 PM

## 2024-01-17 NOTE — Progress Notes (Addendum)
 Advanced Heart Failure Rounding Note  [facility] Cardiologist: Rolan Fuel, MD Chief Complaint: Cardiogenic Shock Subjective:    Moved to ICU on 6/11 for cardiogenic shock.   6/21  lasix  drip increased to 60 mg per hour and  Hypertonic Saline added.  6/25 Hypertonic saline stopped. Given 15 mg  tolvaptan.   Remains on Vaso 0.03, milrinone  0.125 mcg + Norepi 4  mcg + Lasix  drip  30 mg per hour .   CO-OX 57%.    Denies SOB.   Objective:    Vital Signs:   Temp:  [97.3 F (36.3 C)-98.4 F (36.9 C)] 98.2 F (36.8 C) (06/26 0400) Pulse Rate:  [84-101] 101 (06/26 0630) Resp:  [12-30] 25 (06/26 0630) BP: (78-99)/(61-80) 94/73 (06/26 0630) SpO2:  [88 %-98 %] 94 % (06/26 0630) Weight:  [89.7 kg] 89.7 kg (06/26 0630) Last BM Date : 01/14/24  Weight change: Filed Weights   01/15/24 0545 01/16/24 0600 01/17/24 0630  Weight: 84.7 kg 82.6 kg 89.7 kg   Intake/Output:  Intake/Output Summary (Last 24 hours) at 01/17/2024 0755 Last data filed at 01/17/2024 0600 Gross per 24 hour  Intake 1351.06 ml  Output 2900 ml  Net -1548.94 ml  CVP  General:   No resp difficulty Neck: supple. JVP 9-10  Cor: PMI nondisplaced. Regular rate & rhythm. No rubs, gallops or murmurs. Lungs: clear Abdomen: soft, nontender, nondistended.  Extremities: no cyanosis, clubbing, rash, R and LLE 1+ + SCDs. Neuro: alert & oriented x3  Labs: Basic Metabolic Panel: Recent Labs  Lab 01/11/24 0406 01/11/24 1205 01/13/24 0347 01/13/24 1651 01/15/24 0418 01/15/24 1200 01/16/24 0304 01/16/24 1133 01/16/24 1409 01/16/24 1743 01/16/24 2146 01/17/24 0130 01/17/24 0538  NA 122*   < > 123*   < > 125* 126* 126* 126* 125* 128* 127* 125* 123*  K 4.1   < > 3.6   < > 3.1* 4.3 2.8* 4.2  --   --  4.3  --  3.1*  CL 78*   < > 82*   < > 76* 76* 74* 75*  --   --  72*  --  73*  CO2 29   < > 30   < > 32 35* 38* 39*  --   --  38*  --  40*  GLUCOSE 142*   < > 97   < > 146* 251* 125* 179*  --   --  149*  --  154*  BUN 53*   <  > 67*   < > 65* 63* 62* 61*  --   --  66*  --  64*  CREATININE 2.06*   < > 2.16*   < > 1.78* 1.74* 1.66* 1.67*  --   --  1.64*  --  1.65*  CALCIUM 8.9   < > 8.6*   < > 8.8* 9.1 8.8* 8.9  --   --  9.5  --  8.9  MG 2.6*  --  2.7*  --  2.8*  --   --   --   --   --   --   --  2.4  PHOS  --   --   --   --   --  3.0  --  2.4*  --   --  3.0  --   --    < > = values in this interval not displayed.   Liver Function Tests: Recent Labs  Lab 01/11/24 1205 01/15/24 1200 01/16/24 1133 01/16/24 2146  AST 34  --   --   --  ALT 36  --   --   --   ALKPHOS 103  --   --   --   BILITOT 2.3*  --   --   --   PROT 6.0*  --   --   --   ALBUMIN 3.3* 3.3* 3.1* 3.3*    CBC: Recent Labs  Lab 01/11/24 0406 01/12/24 0450 01/13/24 0347 01/14/24 0346 01/15/24 0418  WBC 16.0* 15.2* 14.4* 15.8* 15.4*  HGB 14.5 14.5 14.4 14.3 13.8  HCT 44.3 45.0 44.7 44.2 43.3  MCV 61.9* 61.6* 61.8* 61.9* 61.9*  PLT 174 182 177 169 183   Medications:    Scheduled Medications:  allopurinol   150 mg Oral Daily   amiodarone   400 mg Oral Daily   aspirin  EC  81 mg Oral Daily   Chlorhexidine  Gluconate Cloth  6 each Topical Daily   dapagliflozin  propanediol  10 mg Oral Daily   feeding supplement  237 mL Oral BID BM   fesoterodine   4 mg Oral QODAY   insulin  aspart  0-15 Units Subcutaneous TID WC   potassium chloride   40 mEq Oral Daily   potassium chloride   40 mEq Oral Once   tamsulosin   0.4 mg Oral QODAY   warfarin  2.5 mg Oral ONCE-1600   Warfarin - Pharmacist Dosing Inpatient   Does not apply q1600   Infusions:  furosemide  (LASIX ) 200 mg in dextrose  5 % 100 mL (2 mg/mL) infusion 30 mg/hr (01/17/24 0600)   milrinone  0.125 mcg/kg/min (01/17/24 0740)   norepinephrine  (LEVOPHED ) Adult infusion 4 mcg/min (01/17/24 0600)   potassium chloride  10 mEq (01/17/24 0748)   vasopressin  0.03 Units/min (01/17/24 0600)   PRN Medications: ondansetron  (ZOFRAN ) IV, mouth rinse, traZODone   Assessment/Plan:   1. Acute on chronic  systolic CHF -> cardiogenic shock:  - Coronary angiography in 1/23  mild CAD.  - Echo 01/03/24  EF 25-30% RV mod reduced - Has developed recurrent HF/shock in setting of recurrent AF.  Initial LA 2.1. Co-ox 41%  -COOX stable . CVP down to 7-9 this is as good as we can get him given RV failure.  Stop lasix  drip. Start 3 mg bumex  daily. Give one dose of Diamox 500 mg.  - Continue milrinone  0.125 + Vaso 0.03, + Norepi-4 mcg. -Give Tolvaptan 30 mg daily  2. Mechanical aortic valve: Continue warfarin and ASA 81. - INR 2.5 discussed with pharmacy.   3. Paroxysmal Atrial fibrillation/atrial flutter:  - Admitted 1/18 with atrial fibrillation with RVR and CHF.  EF down to 20-25%.  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   - in cardiogenic shock in setting of recurrent AF.  - Failed DC-CV on 6/13 - Maintaining SR.  Now in NSR, off amiodarone  gtt - Continue amiodarone  400 mg daily - Continue coumadin .  - He will need follow up with EP after d/c   4. Ascending aortic aneurysm: 4.3 cm on 4/24 MRA chest.   5. AKI on CKD2: Creatinine 2.14 on admit. Baseline 1.5. Suspect cardio-renal.  - Creatinine stable at  1.6   6. Hyperkalemia: resolved.   7. Shock liver - likely 2/2 low output/hepatic congestion   8. Hyponatremia -Sodium 123   - limit FW, diuresis -Give 30 mg Tolvaptan.    9. ILD - CT reviewed with P/CCM initially  10. Hypokalemia  K 3.1 Aggressive replace.   OOB. Continue to mobilize.   Length of Stay: 16  CRITICAL CARE Performed by: Greig Mosses NP-C  Total critical care time: 15  minutes  Critical care time was exclusive of separately billable procedures and treating other patients.  Critical care was necessary to treat or prevent imminent or life-threatening deterioration.  Critical care was time spent personally by me on the following activities: development of treatment plan with patient and/or surrogate as well as nursing, discussions  with consultants, evaluation of patient's response to treatment, examination of patient, obtaining history from patient or surrogate, ordering and performing treatments and interventions, ordering and review of laboratory studies, ordering and review of radiographic studies, pulse oximetry and re-evaluation of patient's condition.   Greig Mosses, NP 01/17/2024, 7:55 AM  Advanced Heart Failure Team Pager [phone] (M-F; 7a - 4p). For after hours, see Amion for on call HF MD

## 2024-01-18 DIAGNOSIS — R570 Cardiogenic shock: Secondary | ICD-10-CM | POA: Diagnosis not present

## 2024-01-18 LAB — RENAL FUNCTION PANEL
Albumin: 3.1 g/dL — ABNORMAL LOW (ref 3.5–5.0)
Anion gap: 11 (ref 5–15)
BUN: 66 mg/dL — ABNORMAL HIGH (ref 8–23)
CO2: 40 mmol/L — ABNORMAL HIGH (ref 22–32)
Calcium: 9.3 mg/dL (ref 8.9–10.3)
Chloride: 76 mmol/L — ABNORMAL LOW (ref 98–111)
Creatinine, Ser: 1.77 mg/dL — ABNORMAL HIGH (ref 0.61–1.24)
GFR, Estimated: 38 mL/min — ABNORMAL LOW (ref >60)
Glucose, Bld: 110 mg/dL — ABNORMAL HIGH (ref 70–99)
Phosphorus: 3.2 mg/dL (ref 2.5–4.6)
Potassium: 4.1 mmol/L (ref 3.5–5.1)
Sodium: 127 mmol/L — ABNORMAL LOW (ref 135–145)

## 2024-01-18 LAB — CBC
HCT: 41.6 % (ref 39.0–52.0)
Hemoglobin: 13.3 g/dL (ref 13.0–17.0)
MCH: 19.9 pg — ABNORMAL LOW (ref 26.0–34.0)
MCHC: 32 g/dL (ref 30.0–36.0)
MCV: 62.2 fL — ABNORMAL LOW (ref 80.0–100.0)
Platelets: 191 10*3/uL (ref 150–400)
RBC: 6.69 MIL/uL — ABNORMAL HIGH (ref 4.22–5.81)
RDW: 18.9 % — ABNORMAL HIGH (ref 11.5–15.5)
WBC: 15.6 10*3/uL — ABNORMAL HIGH (ref 4.0–10.5)
nRBC: 1.5 % — ABNORMAL HIGH (ref 0.0–0.2)

## 2024-01-18 LAB — BASIC METABOLIC PANEL WITH GFR
Anion gap: 15 (ref 5–15)
BUN: 66 mg/dL — ABNORMAL HIGH (ref 8–23)
CO2: 39 mmol/L — ABNORMAL HIGH (ref 22–32)
Calcium: 9.3 mg/dL (ref 8.9–10.3)
Chloride: 72 mmol/L — ABNORMAL LOW (ref 98–111)
Creatinine, Ser: 1.68 mg/dL — ABNORMAL HIGH (ref 0.61–1.24)
GFR, Estimated: 40 mL/min — ABNORMAL LOW (ref >60)
Glucose, Bld: 138 mg/dL — ABNORMAL HIGH (ref 70–99)
Potassium: 3.1 mmol/L — ABNORMAL LOW (ref 3.5–5.1)
Sodium: 126 mmol/L — ABNORMAL LOW (ref 135–145)

## 2024-01-18 LAB — COOXEMETRY PANEL
Carboxyhemoglobin: 2.2 % — ABNORMAL HIGH (ref 0.5–1.5)
Methemoglobin: 0.9 % (ref 0.0–1.5)
O2 Saturation: 60.6 %
Total hemoglobin: 13.9 g/dL (ref 12.0–16.0)

## 2024-01-18 LAB — GLUCOSE, CAPILLARY
Glucose-Capillary: 142 mg/dL — ABNORMAL HIGH (ref 70–99)
Glucose-Capillary: 224 mg/dL — ABNORMAL HIGH (ref 70–99)
Glucose-Capillary: 204 mg/dL — ABNORMAL HIGH (ref 70–99)
Glucose-Capillary: 112 mg/dL — ABNORMAL HIGH (ref 70–99)

## 2024-01-18 LAB — SODIUM: Sodium: 126 mmol/L — ABNORMAL LOW (ref 135–145)

## 2024-01-18 LAB — PROTIME-INR
INR: 2.6 — ABNORMAL HIGH (ref 0.8–1.2)
Prothrombin Time: 29.3 s — ABNORMAL HIGH (ref 11.4–15.2)

## 2024-01-18 LAB — MAGNESIUM: Magnesium: 2.5 mg/dL — ABNORMAL HIGH (ref 1.7–2.4)

## 2024-01-18 MED ORDER — BUMETANIDE 2 MG PO TABS
3.0000 mg | ORAL_TABLET | Freq: Once | ORAL | Status: AC
  Administered 2024-01-18: 3 mg via ORAL
  Filled 2024-01-18: qty 1

## 2024-01-18 MED ORDER — POTASSIUM CHLORIDE CRYS ER 20 MEQ PO TBCR
40.0000 meq | EXTENDED_RELEASE_TABLET | Freq: Once | ORAL | Status: AC
  Administered 2024-01-18: 40 meq via ORAL
  Filled 2024-01-18: qty 2

## 2024-01-18 MED ORDER — MIDODRINE HCL 5 MG PO TABS
5.0000 mg | ORAL_TABLET | Freq: Three times a day (TID) | ORAL | Status: DC
  Administered 2024-01-18 – 2024-01-19 (×3): 5 mg via ORAL
  Filled 2024-01-18 (×3): qty 1

## 2024-01-18 MED ORDER — POTASSIUM CHLORIDE CRYS ER 10 MEQ PO TBCR
60.0000 meq | EXTENDED_RELEASE_TABLET | Freq: Once | ORAL | Status: AC
  Administered 2024-01-18: 60 meq via ORAL
  Filled 2024-01-18: qty 6

## 2024-01-18 MED ORDER — WARFARIN SODIUM 2.5 MG PO TABS
2.5000 mg | ORAL_TABLET | Freq: Once | ORAL | Status: AC
  Administered 2024-01-18: 2.5 mg via ORAL
  Filled 2024-01-18: qty 1

## 2024-01-18 MED ORDER — ACETAZOLAMIDE 250 MG PO TABS
500.0000 mg | ORAL_TABLET | Freq: Two times a day (BID) | ORAL | Status: AC
  Administered 2024-01-18 (×2): 500 mg via ORAL
  Filled 2024-01-18 (×2): qty 2

## 2024-01-18 MED ORDER — BUMETANIDE 2 MG PO TABS
4.0000 mg | ORAL_TABLET | Freq: Once | ORAL | Status: AC
  Administered 2024-01-18: 4 mg via ORAL
  Filled 2024-01-18: qty 2

## 2024-01-18 NOTE — Progress Notes (Signed)
 Advanced Heart Failure Rounding Note  [facility] Cardiologist: Rolan Fuel, MD Chief Complaint: Cardiogenic Shock Subjective:   Moved to ICU on 6/11 for cardiogenic shock.   6/21  lasix  drip increased to 60 mg per hour and  Hypertonic Saline added.  6/25 Hypertonic saline stopped. Given 15 mg  tolvaptan .   Remains on Vaso 0.03, milrinone  0.125 mcg + Norepi 4  mcg.   Now off Lasix  drip. Diuresed yesterday with Bumex  and diamox . -3.2L UOP.   CO-OX 61%. K 3.1.    Feels ok. Family at bedside.   Objective:    Vital Signs:   Temp:  [97.6 F (36.4 C)-98.5 F (36.9 C)] 98.4 F (36.9 C) (06/27 0700) Pulse Rate:  [80-120] 92 (06/27 0615) Resp:  [11-27] 16 (06/27 0615) BP: (75-123)/(54-108) 99/68 (06/27 0615) SpO2:  [72 %-98 %] 94 % (06/27 0615) Weight:  [84.8 kg] 84.8 kg (06/27 0500) Last BM Date : 01/17/24  Weight change: Filed Weights   01/17/24 0630 01/17/24 0700 01/18/24 0500  Weight: 89.7 kg 85.4 kg 84.8 kg   Intake/Output:  Intake/Output Summary (Last 24 hours) at 01/18/2024 0834 Last data filed at 01/18/2024 0600 Gross per 24 hour  Intake 660.19 ml  Output 3225 ml  Net -2564.81 ml  CVP 13 General:  elderly / frail appearing.  No respiratory difficulty Neck:  JVD elevated. LIJ CVC Cor: PMI nondisplaced. Regular rate & rhythm. No rubs, gallops or murmurs. Lungs: clear Extremities: no cyanosis, clubbing, rash, +1 BLE edema  Neuro: alert & oriented x 3.  Affect flat.   Labs: Basic Metabolic Panel: Recent Labs  Lab 01/13/24 0347 01/13/24 1651 01/15/24 0418 01/15/24 1200 01/16/24 0304 01/16/24 1133 01/16/24 1409 01/16/24 2146 01/17/24 0130 01/17/24 0538 01/17/24 0928 01/17/24 1715 01/18/24 0103 01/18/24 0423  NA 123*   < > 125* 126*   < > 126*   < > 127*   < > 123* 124* 127* 126* 126*  K 3.6   < > 3.1* 4.3   < > 4.2  --  4.3  --  3.1* 3.3* 4.8  --  3.1*  CL 82*   < > 76* 76*   < > 75*  --  72*  --  73* 72* 74*  --  72*  CO2 30   < > 32 35*   < > 39*  --  38*   --  40* 37* 38*  --  39*  GLUCOSE 97   < > 146* 251*   < > 179*  --  149*  --  154* 210* 93  --  138*  BUN 67*   < > 65* 63*   < > 61*  --  66*  --  64* 63* 67*  --  66*  CREATININE 2.16*   < > 1.78* 1.74*   < > 1.67*  --  1.64*  --  1.65* 1.70* 1.72*  --  1.68*  CALCIUM 8.6*   < > 8.8* 9.1   < > 8.9  --  9.5  --  8.9 9.0 9.3  --  9.3  MG 2.7*  --  2.8*  --   --   --   --   --   --  2.4  --   --   --  2.5*  PHOS  --   --   --  3.0  --  2.4*  --  3.0  --   --   --  3.3  --   --    < > =  values in this interval not displayed.   Liver Function Tests: Recent Labs  Lab 01/11/24 1205 01/15/24 1200 01/16/24 1133 01/16/24 2146 01/17/24 0928 01/17/24 1715  AST 34  --   --   --  28  --   ALT 36  --   --   --  20  --   ALKPHOS 103  --   --   --  113  --   BILITOT 2.3*  --   --   --  3.0*  --   PROT 6.0*  --   --   --  5.7*  --   ALBUMIN 3.3* 3.3* 3.1* 3.3* 3.2* 3.2*    CBC: Recent Labs  Lab 01/13/24 0347 01/14/24 0346 01/15/24 0418 01/17/24 0928 01/18/24 0423  WBC 14.4* 15.8* 15.4* 16.2* 15.6*  HGB 14.4 14.3 13.8 13.7 13.3  HCT 44.7 44.2 43.3 42.4 41.6  MCV 61.8* 61.9* 61.9* 62.3* 62.2*  PLT 177 169 183 197 191   Medications:    Scheduled Medications:  allopurinol   150 mg Oral Daily   amiodarone   400 mg Oral Daily   aspirin  EC  81 mg Oral Daily   Chlorhexidine  Gluconate Cloth  6 each Topical Daily   dapagliflozin  propanediol  10 mg Oral Daily   feeding supplement  237 mL Oral BID BM   fesoterodine   4 mg Oral QODAY   insulin  aspart  0-15 Units Subcutaneous TID WC   potassium chloride   40 mEq Oral Daily   tamsulosin   0.4 mg Oral QODAY   Warfarin - Pharmacist Dosing Inpatient   Does not apply q1600   Infusions:  milrinone  0.125 mcg/kg/min (01/18/24 0600)   norepinephrine  (LEVOPHED ) Adult infusion 4 mcg/min (01/18/24 0600)   vasopressin  0.03 Units/min (01/18/24 0600)   PRN Medications: ondansetron  (ZOFRAN ) IV, mouth rinse, traZODone   Assessment/Plan:   1. Acute on  chronic systolic CHF -> cardiogenic shock:  - Coronary angiography in 1/23  mild CAD.  - Echo 01/03/24  EF 25-30% RV mod reduced - Has developed recurrent HF/shock in setting of recurrent AF.  Initial LA 2.1. Co-ox 41%  - CO-OX stable. CVP 13.  - Volume elevated today. Lasix  gtt stopped 6/26.  Diurese today with 4am/3pm Bumex  and diamox  500 mg BID. (Home dose plan for 3 Bumex  BID) - Continue milrinone  0.125 + Vaso 0.03, + Norepi-4 mcg. Wean Vaso to 0.02 while in room. Continue to wean vaso off followed by NE.  - Start midodrine 5 mg TID to help wean off pressors.  - Add fluid restriction.  - S/p tolvaptan  6/25 and 6/26  2. Mechanical aortic valve: Continue warfarin and ASA 81. - INR 2.6 discussed with pharmacy.   3. Paroxysmal Atrial fibrillation/atrial flutter:  - Admitted 1/18 with atrial fibrillation with RVR and CHF.  EF down to 20-25%.  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   - in cardiogenic shock in setting of recurrent AF.  - Failed DC-CV on 6/13 - Maintaining SR.  Now in NSR, off amiodarone  gtt - Continue amiodarone  400 mg daily - Continue coumadin .  - He will need follow up with EP after d/c   4. Ascending aortic aneurysm: 4.3 cm on 4/24 MRA chest.   5. AKI on CKD2: Creatinine 2.14 on admit. Baseline 1.5. Suspect cardio-renal.  - Creatinine stable at  1.6   6. Hyperkalemia: resolved.   7. Shock liver - likely 2/2 low output/hepatic congestion   8. Hyponatremia - limit FW, diuresis - Up  to 126 with tolvaptan  15 mg x1 and 30 mg yesterday - Follow  9. ILD - CT reviewed with P/CCM initially  10. Hypokalemia  - K 3.1 [place].    OOB. Continue to mobilize.   Length of Stay: 17  CRITICAL CARE Performed by: Beckey LITTIE Coe AGACNP-BC   Total critical care time: 14  minutes  Critical care time was exclusive of separately billable procedures and treating other patients.  Critical care was necessary to treat or prevent imminent or  life-threatening deterioration.  Critical care was time spent personally by me on the following activities: development of treatment plan with patient and/or surrogate as well as nursing, discussions with consultants, evaluation of patient's response to treatment, examination of patient, obtaining history from patient or surrogate, ordering and performing treatments and interventions, ordering and review of laboratory studies, ordering and review of radiographic studies, pulse oximetry and re-evaluation of patient's condition.   Beckey LITTIE Coe, NP 01/18/2024, 8:34 AM  Advanced Heart Failure Team Pager [phone] (M-F; 7a - 4p). For after hours, see Amion for on call HF MD

## 2024-01-18 NOTE — Progress Notes (Signed)
 Occupational Therapy Treatment Patient Details Name: Adrian Lambert MRN: [id number] DOB: [birth year] Today's Date: 01/18/2024   History of present illness Pt is 83 yo presenting to [facility] ED on 6/10 due to shortness of breath Developed cardiogenic shock, progressive hypotension and signs of hyperperfusion. Admitted to ICU for inotropes. PMH: arthritis, systolic and diastolic CHF, diverticulosis, HTN, incomplete RBBB, PAF   OT comments  Pt progressed from bed to chair this session but expressed fatigue with movement. Pt with MAP  equal or greater than 70 throughout transfer. Pt completed static sitting LB exercises. REcommendation for Patient will benefit from intensive inpatient follow-up therapy, >3 hours/day       If plan is discharge home, recommend the following:  Two people to help with walking and/or transfers;Two people to help with bathing/dressing/bathroom   Equipment Recommendations  BSC/3in1;Other (comment)    Recommendations for Other Services Rehab consult    Precautions / Restrictions Precautions Precautions: Fall Recall of Precautions/Restrictions: Intact Precaution/Restrictions Comments: watch O2 sats, HR, BP Restrictions Weight Bearing Restrictions Per Provider Order: No       Mobility Bed Mobility Overal bed mobility: Needs Assistance Bed Mobility: Supine to Sit     Supine to sit: Mod assist, HOB elevated     General bed mobility comments: pt able to progress bil LE toward EOB. pt needs (A) to elevate trunk from bed surface.    Transfers Overall transfer level: Needs assistance   Transfers: Sit to/from Stand Sit to Stand: Max assist, From elevated surface (stedy)           General transfer comment: pt with elevated surface to help with power up . pt initiates but needs (A) to sustain static standing with verbal cues to lean foward belly to the bar     Balance Overall balance assessment: Needs assistance Sitting-balance support: Bilateral upper  extremity supported, Feet supported Sitting balance-Leahy Scale: Poor     Standing balance support: Bilateral upper extremity supported, During functional activity, Reliant on assistive device for balance Standing balance-Leahy Scale: Poor                             ADL either performed or assessed with clinical judgement   ADL Overall ADL's : Needs assistance/impaired                         Toilet Transfer: Maximal assistance (use of stedy to transfer to chair)             General ADL Comments: pt fatigued with transfer to chair    Extremity/Trunk Assessment Upper Extremity Assessment Upper Extremity Assessment: Generalized weakness   Lower Extremity Assessment Lower Extremity Assessment: Generalized weakness        Vision       Perception     Praxis     Communication Communication Communication: Impaired Factors Affecting Communication: Reduced clarity of speech   Cognition Arousal: Alert Behavior During Therapy: WFL for tasks assessed/performed Cognition: No apparent impairments             OT - Cognition Comments: slow processing                 Following commands: Intact        Cueing      Exercises Exercises: General Lower Extremity General Exercises - Lower Extremity Quad Sets: AROM, Both, 10 reps, Supine    Shoulder Instructions  General Comments MAP (70-74) throughout session. pt desat with sitting requires 2L [place] . pt denies dizziness only fatigue    Pertinent Vitals/ Pain       Pain Assessment Pain Assessment: No/denies pain  Home Living                                          Prior Functioning/Environment              Frequency  Min 2X/week        Progress Toward Goals  OT Goals(current goals can now be found in the care plan section)  Progress towards OT goals: Progressing toward goals  Acute Rehab OT Goals OT Goal Formulation: With patient Time For  Goal Achievement: 01/29/24 Potential to Achieve Goals: Good ADL Goals Pt Will Perform Grooming: with supervision;standing Pt Will Perform Lower Body Bathing: with supervision;sit to/from stand Pt Will Perform Lower Body Dressing: with supervision;sit to/from stand Pt Will Transfer to Toilet: with supervision;ambulating Pt Will Perform Toileting - Clothing Manipulation and hygiene: with supervision;sit to/from stand Additional ADL Goal #1: Pt will generalize energy conservation strategies in ADLs and mobility.  Plan      Co-evaluation                 AM-PAC OT 6 Clicks Daily Activity     Outcome Measure   Help from another person eating meals?: A Little Help from another person taking care of personal grooming?: A Little Help from another person toileting, which includes using toliet, bedpan, or urinal?: A Lot Help from another person bathing (including washing, rinsing, drying)?: A Lot Help from another person to put on and taking off regular upper body clothing?: A Little Help from another person to put on and taking off regular lower body clothing?: A Lot 6 Click Score: 15    End of Session Equipment Utilized During Treatment: Gait belt;Other (comment);Oxygen (stedy)  OT Visit Diagnosis: Unsteadiness on feet (R26.81)   Activity Tolerance Patient limited by fatigue   Patient Left in chair;with call bell/phone within reach;Other (comment) (geo mat in chair for pressure relief)   Nurse Communication Mobility status;Precautions        Time: 8974-8946 OT Time Calculation (min): 28 min  Charges: OT General Charges $OT Visit: 1 Visit OT Treatments $Self Care/Home Management : 23-37 mins   Brynn, OTR/L  Acute Rehabilitation Services Office: [phone] .   Ely Molt 01/18/2024, 11:03 AM

## 2024-01-18 NOTE — TOC Progression Note (Signed)
 Transition of Care [facility]) - Progression Note    Patient Details  Name: Adrian Lambert MRN: [id number] Date of Birth: [birth year]  Transition [facility]) CM/SW Contact  Arlana JINNY Nicholaus [place] Phone Number: [phone] 01/18/2024, 10:14 AM  Clinical Narrative:   HF CSW/NCM will continue to follow and monitor for safety dc planning.   CIR following.   TOC will continue following.     Expected Discharge Plan: IP Rehab Facility Barriers to Discharge: Continued Medical Work up  Expected Discharge Plan and Services   Discharge Planning Services: CM Consult Post Acute Care Choice: IP Rehab Living arrangements for the past 2 months: Single Family Home                                       Social Determinants of Health (SDOH) Interventions SDOH Screenings   Food Insecurity: No Food Insecurity (01/02/2024)  Housing: Low Risk  (01/02/2024)  Transportation Needs: No Transportation Needs (01/02/2024)  Utilities: Not At Risk (01/02/2024)  Social Connections: Moderately Isolated (01/02/2024)  Tobacco Use: Low Risk  (01/02/2024)    Readmission Risk Interventions     No data to display

## 2024-01-18 NOTE — Progress Notes (Signed)
 ANTICOAGULATION CONSULT NOTE  Pharmacy Consult for Warfarin Indication: afib with mechanical AVR  Allergies  Allergen Reactions   Celecoxib Rash    Other Reaction(s): Unknown    Patient Measurements: Height: 5' 10 (177.8 cm) Weight: 84.8 kg (186 lb 15.2 oz) IBW/kg (Calculated) : 73  Vital Signs: Temp: 98.4 F (36.9 C) (06/27 0400) Temp Source: Axillary (06/27 0400) BP: 99/68 (06/27 0615) Pulse Rate: 92 (06/27 0615)  Labs: Recent Labs    01/16/24 0304 01/16/24 1133 01/17/24 0538 01/17/24 0928 01/17/24 1715 01/18/24 0423  HGB  --   --   --  13.7  --  13.3  HCT  --   --   --  42.4  --  41.6  PLT  --   --   --  197  --  191  LABPROT 29.8*  --  28.5*  --   --  29.3*  INR 2.8*  --  2.5*  --   --  2.6*  CREATININE 1.66*   < > 1.65* 1.70* 1.72* 1.68*   < > = values in this interval not displayed.    Estimated Creatinine Clearance: 35 mL/min (A) (by C-G formula based on SCr of 1.68 mg/dL (H)).   Medical History: Past Medical History:  Diagnosis Date   Anticoagulant long-term use    Arthritis    probably in my thumbs (06/26/2012)   Bifascicular block    Chronic combined systolic and diastolic CHF (congestive heart failure) (HCC)    a. 01/2016 Echo: EF 45-50%, gr2 DD, inflat AK (poor acoustic windows);  b. 07/2016 Echo: EF 25-30%, mild LVH, PASP (pt in Afib).   Complication of anesthesia    wake up w/a start; hallucinations (06/26/2012)   Critical Aortic Stenosis    a. 03/2003 s/p SJM #25 mech prosthetic AoV;  b. 07/2016 Echo: EF 25-30% (in setting of AF), AoV area 1.72 cm^2 (VTI), 1.75 cm^2 (Vmean).   Diverticulosis    Gouty arthritis    have had it in both feet, ankles, right knee (06/26/2012)   History of diverticulitis of colon    History of kidney stones    History of pancreatitis    Hypertension    Incomplete RBBB    PAF (paroxysmal atrial fibrillation) (HCC)    a. CHA2DS2VASc = 4-->chronic coumadin  in setting of mech AoV;  b. 07/2016 Recurrent AF  RVR.    Medications:  Medications Prior to Admission  Medication Sig Dispense Refill Last Dose/Taking   acetaminophen  (TYLENOL ) 500 MG tablet Take 500 mg by mouth 2 (two) times daily.   01/01/2024   albuterol  (VENTOLIN  HFA) 108 (90 Base) MCG/ACT inhaler Inhale 2 puffs into the lungs every 4 (four) hours.   01/01/2024   allopurinol  (ZYLOPRIM ) 300 MG tablet Take 150 mg by mouth daily.   01/01/2024   aspirin  EC 81 MG tablet Take 81 mg by mouth daily. Swallow whole.   01/01/2024   clobetasol cream (TEMOVATE) 0.05 % Apply 1 Application topically 2 (two) times daily.   01/01/2024   colchicine  0.6 MG tablet Take 1 tablet (0.6 mg total) by mouth daily as needed (for gout).   Unknown   FARXIGA  10 MG TABS tablet TAKE 1 TABLET BY MOUTH EVERY DAY 30 tablet 6 01/01/2024   FIBER PO Take 2 capsules by mouth 2 (two) times daily.   01/01/2024   folic acid  (FOLVITE ) 1 MG tablet TAKE 1 TABLET(1 MG) BY MOUTH DAILY 90 tablet 3 01/01/2024   metoprolol  succinate (TOPROL -XL) 25 MG 24  hr tablet TAKE 1 TABLET BY MOUTH EVERYDAY AT BEDTIME 90 tablet 1 12/31/2023   potassium chloride  SA (KLOR-CON  M) 20 MEQ tablet Take 1 tablet (20 mEq total) by mouth daily.   01/01/2024   tamsulosin  (FLOMAX ) 0.4 MG CAPS capsule Take 1 capsule (0.4 mg total) by mouth every other day. 90 capsule 3 01/01/2024   tolterodine  (DETROL  LA) 4 MG 24 hr capsule Take 1 capsule (4 mg total) by mouth every other day. Alternating days with the tamsulosin  90 capsule 3 12/31/2023   torsemide  (DEMADEX ) 20 MG tablet Take 3 tablets (60 mg total) by mouth daily.   01/01/2024   warfarin (COUMADIN ) 5 MG tablet Take 0.5-1 tablets (2.5-5 mg total) by mouth See admin instructions. 1/19 and 1/20  take 7.5mg  (1 and 1/2 tab) then take 1 tab (5mg ) daily until seen by MD  Take 1 tablet (5 mg) by mouth on Sundays, Mondays, Wednesdays, Thursdays & Saturdays in the evening. Take 0.5 tablet (2.5 mg) by mouth on Tuesdays & Fridays in the evening. (Patient taking differently: Take 2.5-5 mg  by mouth See admin instructions. AS directed by PCP 2.5 mg Daily) 45 tablet 11 12/31/2023 at  8:00 PM   docusate sodium  (COLACE) 100 MG capsule Take 100 mg by mouth 2 (two) times daily. (Patient not taking: Reported on 01/01/2024)   Not Taking   polyethylene glycol powder (GLYCOLAX /MIRALAX ) 17 GM/SCOOP powder Take 17 g by mouth 2 (two) times daily as needed for moderate constipation or mild constipation. (Patient not taking: Reported on 12/26/2023) 238 g 2    Scheduled:   allopurinol   150 mg Oral Daily   amiodarone   400 mg Oral Daily   aspirin  EC  81 mg Oral Daily   Chlorhexidine  Gluconate Cloth  6 each Topical Daily   dapagliflozin  propanediol  10 mg Oral Daily   feeding supplement  237 mL Oral BID BM   fesoterodine   4 mg Oral QODAY   insulin  aspart  0-15 Units Subcutaneous TID WC   potassium chloride   40 mEq Oral Daily   tamsulosin   0.4 mg Oral QODAY   Warfarin - Pharmacist Dosing Inpatient   Does not apply q1600   Infusions:   milrinone  0.125 mcg/kg/min (01/18/24 0600)   norepinephrine  (LEVOPHED ) Adult infusion 4 mcg/min (01/18/24 0600)   vasopressin  0.03 Units/min (01/18/24 0600)   PRN: ondansetron  (ZOFRAN ) IV, mouth rinse, traZODone   Assessment: 83 yo male presenting with worsening shortness of breath. On warfarin PTA for afib with mechanical AVR.    Per patient, dose is 5 mg daily, poor historian. Upon chart review patient should be on 2.5 mg once daily on Monday, Tuesday, Thursday, Friday, and Sunday and 1 mg on Wednesday and Saturday per last PCP note on 12/18/2023. Goal INR is 2.5-3.5.   Pertinent DDI's - s/p amio IV (none PTA, 6/12 >20), PO amio (6/20 >), allopurinol  (PTA). Amiodarone  PO dose increased from 200mg  daily to 400mg  daily today (6/23 >). Oral intake documented at 75%, also receiving nutritional supplements (ensure plus high protein).  6/27 AM: INR 2.6, therapeutic on PTA regimen (1 mg Wednesday, Saturday and 2.5 mg EOD). CBC stable (Hgb 13.3, PLT 191). No bleeding  noted. Noted amiodarone  was increased from 200 mg to 400 mg daily on 6/23. Oral intake last documented at 50%, also receiving nutritional supplements.   Goal of Therapy:  INR 2.5-3.5 Monitor platelets by anticoagulation protocol: Yes   Plan:  Warfarin 2.5 mg x1 (PTA regimen) Monitor for s/sx of bleeding, daily INR,  CBC Watch for new DDIs and changes in appetite  Thank you for allowing pharmacy to participate in this patient's care,  Morna Breach, PharmD PGY2 Cardiology Pharmacy Resident 01/18/2024 6:50 AM

## 2024-01-18 NOTE — Progress Notes (Signed)
 Inpatient Rehab Admissions Coordinator:  Pt is not medically ready for a potential CIR admission. Pt remains on multiple drips. Will continue to follow from a distance.   Tinnie Yvone Cohens, MS, CCC-SLP Admissions Coordinator [phone]

## 2024-01-19 DIAGNOSIS — R570 Cardiogenic shock: Secondary | ICD-10-CM | POA: Diagnosis not present

## 2024-01-19 LAB — CBC
HCT: 43.2 % (ref 39.0–52.0)
Hemoglobin: 13.9 g/dL (ref 13.0–17.0)
MCH: 20.3 pg — ABNORMAL LOW (ref 26.0–34.0)
MCHC: 32.2 g/dL (ref 30.0–36.0)
MCV: 63.2 fL — ABNORMAL LOW (ref 80.0–100.0)
Platelets: 210 10*3/uL (ref 150–400)
RBC: 6.84 MIL/uL — ABNORMAL HIGH (ref 4.22–5.81)
RDW: 19.1 % — ABNORMAL HIGH (ref 11.5–15.5)
WBC: 17.3 10*3/uL — ABNORMAL HIGH (ref 4.0–10.5)
nRBC: 2.3 % — ABNORMAL HIGH (ref 0.0–0.2)

## 2024-01-19 LAB — GLUCOSE, CAPILLARY
Glucose-Capillary: 208 mg/dL — ABNORMAL HIGH (ref 70–99)
Glucose-Capillary: 205 mg/dL — ABNORMAL HIGH (ref 70–99)
Glucose-Capillary: 151 mg/dL — ABNORMAL HIGH (ref 70–99)
Glucose-Capillary: 198 mg/dL — ABNORMAL HIGH (ref 70–99)

## 2024-01-19 LAB — BASIC METABOLIC PANEL WITH GFR
Anion gap: 15 (ref 5–15)
BUN: 73 mg/dL — ABNORMAL HIGH (ref 8–23)
CO2: 37 mmol/L — ABNORMAL HIGH (ref 22–32)
Calcium: 9.6 mg/dL (ref 8.9–10.3)
Chloride: 75 mmol/L — ABNORMAL LOW (ref 98–111)
Creatinine, Ser: 1.88 mg/dL — ABNORMAL HIGH (ref 0.61–1.24)
GFR, Estimated: 35 mL/min — ABNORMAL LOW (ref >60)
Glucose, Bld: 153 mg/dL — ABNORMAL HIGH (ref 70–99)
Potassium: 3.9 mmol/L (ref 3.5–5.1)
Sodium: 127 mmol/L — ABNORMAL LOW (ref 135–145)

## 2024-01-19 LAB — COOXEMETRY PANEL
Carboxyhemoglobin: 1.6 % — ABNORMAL HIGH (ref 0.5–1.5)
Carboxyhemoglobin: 2.2 % — ABNORMAL HIGH (ref 0.5–1.5)
Methemoglobin: 0.8 % (ref 0.0–1.5)
Methemoglobin: <0.7 % (ref 0.0–1.5)
O2 Saturation: 59.4 %
O2 Saturation: 65.1 %
Total hemoglobin: 14.4 g/dL (ref 12.0–16.0)
Total hemoglobin: 12.1 g/dL (ref 12.0–16.0)

## 2024-01-19 LAB — PROTIME-INR
INR: 2.6 — ABNORMAL HIGH (ref 0.8–1.2)
Prothrombin Time: 28.8 s — ABNORMAL HIGH (ref 11.4–15.2)

## 2024-01-19 LAB — MAGNESIUM: Magnesium: 2.7 mg/dL — ABNORMAL HIGH (ref 1.7–2.4)

## 2024-01-19 MED ORDER — BUMETANIDE 2 MG PO TABS
4.0000 mg | ORAL_TABLET | ORAL | Status: AC
  Administered 2024-01-19 (×2): 4 mg via ORAL
  Filled 2024-01-19 (×2): qty 2

## 2024-01-19 MED ORDER — BUMETANIDE 2 MG PO TABS: 3.0000 mg | ORAL_TABLET | Freq: Two times a day (BID) | ORAL | Status: DC

## 2024-01-19 MED ORDER — BUMETANIDE 2 MG PO TABS
3.0000 mg | ORAL_TABLET | ORAL | Status: DC
  Filled 2024-01-19 (×2): qty 1

## 2024-01-19 MED ORDER — WARFARIN SODIUM 1 MG PO TABS
1.0000 mg | ORAL_TABLET | Freq: Once | ORAL | Status: AC
  Administered 2024-01-19: 1 mg via ORAL
  Filled 2024-01-19: qty 1

## 2024-01-19 MED ORDER — ACETAZOLAMIDE 250 MG PO TABS
500.0000 mg | ORAL_TABLET | Freq: Two times a day (BID) | ORAL | Status: DC
  Administered 2024-01-19 – 2024-01-25 (×13): 500 mg via ORAL
  Filled 2024-01-19 (×13): qty 2

## 2024-01-19 MED ORDER — BUMETANIDE 2 MG PO TABS: 4.0000 mg | ORAL_TABLET | Freq: Two times a day (BID) | ORAL | Status: DC

## 2024-01-19 MED ORDER — MIDODRINE HCL 5 MG PO TABS
10.0000 mg | ORAL_TABLET | Freq: Three times a day (TID) | ORAL | Status: DC
  Administered 2024-01-19 – 2024-01-20 (×3): 10 mg via ORAL
  Filled 2024-01-19 (×3): qty 2

## 2024-01-19 MED ORDER — POTASSIUM CHLORIDE CRYS ER 20 MEQ PO TBCR
40.0000 meq | EXTENDED_RELEASE_TABLET | Freq: Once | ORAL | Status: AC
  Administered 2024-01-19: 40 meq via ORAL
  Filled 2024-01-19: qty 2

## 2024-01-19 NOTE — Progress Notes (Signed)
 ANTICOAGULATION CONSULT NOTE  Pharmacy Consult for Warfarin Indication: afib with mechanical AVR  Allergies  Allergen Reactions   Celecoxib Rash    Other Reaction(s): Unknown    Patient Measurements: Height: 5' 10 (177.8 cm) Weight: 83.9 kg (184 lb 15.5 oz) IBW/kg (Calculated) : 73  Vital Signs: Temp: 97.7 F (36.5 C) (06/28 0329) Temp Source: Axillary (06/28 0329) BP: 99/79 (06/28 9367) Pulse Rate: 95 (06/28 0632)  Labs: Recent Labs    01/17/24 0538 01/17/24 0538 01/17/24 0928 01/17/24 1715 01/18/24 0423 01/18/24 1400 01/19/24 0453  HGB  --    < > 13.7  --  13.3  --  13.9  HCT  --   --  42.4  --  41.6  --  43.2  PLT  --   --  197  --  191  --  210  LABPROT 28.5*  --   --   --  29.3*  --  28.8*  INR 2.5*  --   --   --  2.6*  --  2.6*  CREATININE 1.65*  --  1.70* 1.72* 1.68* 1.77*  --    < > = values in this interval not displayed.    Estimated Creatinine Clearance: 33.2 mL/min (A) (by C-G formula based on SCr of 1.77 mg/dL (H)).   Medical History: Past Medical History:  Diagnosis Date   Anticoagulant long-term use    Arthritis    probably in my thumbs (06/26/2012)   Bifascicular block    Chronic combined systolic and diastolic CHF (congestive heart failure) (HCC)    a. 01/2016 Echo: EF 45-50%, gr2 DD, inflat AK (poor acoustic windows);  b. 07/2016 Echo: EF 25-30%, mild LVH, PASP (pt in Afib).   Complication of anesthesia    wake up w/a start; hallucinations (06/26/2012)   Critical Aortic Stenosis    a. 03/2003 s/p SJM #25 mech prosthetic AoV;  b. 07/2016 Echo: EF 25-30% (in setting of AF), AoV area 1.72 cm^2 (VTI), 1.75 cm^2 (Vmean).   Diverticulosis    Gouty arthritis    have had it in both feet, ankles, right knee (06/26/2012)   History of diverticulitis of colon    History of kidney stones    History of pancreatitis    Hypertension    Incomplete RBBB    PAF (paroxysmal atrial fibrillation) (HCC)    a. CHA2DS2VASc = 4-->chronic coumadin  in  setting of mech AoV;  b. 07/2016 Recurrent AF RVR.    Medications:  Medications Prior to Admission  Medication Sig Dispense Refill Last Dose/Taking   acetaminophen  (TYLENOL ) 500 MG tablet Take 500 mg by mouth 2 (two) times daily.   01/01/2024   albuterol  (VENTOLIN  HFA) 108 (90 Base) MCG/ACT inhaler Inhale 2 puffs into the lungs every 4 (four) hours.   01/01/2024   allopurinol  (ZYLOPRIM ) 300 MG tablet Take 150 mg by mouth daily.   01/01/2024   aspirin  EC 81 MG tablet Take 81 mg by mouth daily. Swallow whole.   01/01/2024   clobetasol cream (TEMOVATE) 0.05 % Apply 1 Application topically 2 (two) times daily.   01/01/2024   colchicine  0.6 MG tablet Take 1 tablet (0.6 mg total) by mouth daily as needed (for gout).   Unknown   FARXIGA  10 MG TABS tablet TAKE 1 TABLET BY MOUTH EVERY DAY 30 tablet 6 01/01/2024   FIBER PO Take 2 capsules by mouth 2 (two) times daily.   01/01/2024   folic acid  (FOLVITE ) 1 MG tablet TAKE 1 TABLET(1 MG) BY  MOUTH DAILY 90 tablet 3 01/01/2024   metoprolol  succinate (TOPROL -XL) 25 MG 24 hr tablet TAKE 1 TABLET BY MOUTH EVERYDAY AT BEDTIME 90 tablet 1 12/31/2023   potassium chloride  SA (KLOR-CON  M) 20 MEQ tablet Take 1 tablet (20 mEq total) by mouth daily.   01/01/2024   tamsulosin  (FLOMAX ) 0.4 MG CAPS capsule Take 1 capsule (0.4 mg total) by mouth every other day. 90 capsule 3 01/01/2024   tolterodine  (DETROL  LA) 4 MG 24 hr capsule Take 1 capsule (4 mg total) by mouth every other day. Alternating days with the tamsulosin  90 capsule 3 12/31/2023   torsemide  (DEMADEX ) 20 MG tablet Take 3 tablets (60 mg total) by mouth daily.   01/01/2024   warfarin (COUMADIN ) 5 MG tablet Take 0.5-1 tablets (2.5-5 mg total) by mouth See admin instructions. 1/19 and 1/20  take 7.5mg  (1 and 1/2 tab) then take 1 tab (5mg ) daily until seen by MD  Take 1 tablet (5 mg) by mouth on Sundays, Mondays, Wednesdays, Thursdays & Saturdays in the evening. Take 0.5 tablet (2.5 mg) by mouth on Tuesdays & Fridays in the  evening. (Patient taking differently: Take 2.5-5 mg by mouth See admin instructions. AS directed by PCP 2.5 mg Daily) 45 tablet 11 12/31/2023 at  8:00 PM   docusate sodium  (COLACE) 100 MG capsule Take 100 mg by mouth 2 (two) times daily. (Patient not taking: Reported on 01/01/2024)   Not Taking   polyethylene glycol powder (GLYCOLAX /MIRALAX ) 17 GM/SCOOP powder Take 17 g by mouth 2 (two) times daily as needed for moderate constipation or mild constipation. (Patient not taking: Reported on 12/26/2023) 238 g 2    Scheduled:   allopurinol   150 mg Oral Daily   amiodarone   400 mg Oral Daily   aspirin  EC  81 mg Oral Daily   Chlorhexidine  Gluconate Cloth  6 each Topical Daily   dapagliflozin  propanediol  10 mg Oral Daily   feeding supplement  237 mL Oral BID BM   fesoterodine   4 mg Oral QODAY   insulin  aspart  0-15 Units Subcutaneous TID WC   midodrine  5 mg Oral Q8H   potassium chloride   40 mEq Oral Daily   tamsulosin   0.4 mg Oral QODAY   Warfarin - Pharmacist Dosing Inpatient   Does not apply q1600   Infusions:   milrinone  0.125 mcg/kg/min (01/19/24 0600)   norepinephrine  (LEVOPHED ) Adult infusion 4 mcg/min (01/19/24 0600)   vasopressin  0.03 Units/min (01/19/24 0600)   PRN: ondansetron  (ZOFRAN ) IV, mouth rinse, traZODone   Assessment: 83 yo male presenting with worsening shortness of breath. On warfarin PTA for afib with mechanical AVR.    Per patient, dose is 5 mg daily, poor historian. Upon chart review patient should be on 2.5 mg once daily on Monday, Tuesday, Thursday, Friday, and Sunday and 1 mg on Wednesday and Saturday per last PCP note on 12/18/2023. Goal INR is 2.5-3.5.   Pertinent DDI's - s/p amio IV (none PTA, 6/12 >20), PO amio (6/20 >), allopurinol  (PTA). Amiodarone  PO dose increased from 200mg  daily to 400mg  daily today (6/23 >). Oral intake documented at 75%, also receiving nutritional supplements (ensure plus high protein).  6/28 AM: INR 2.6, therapeutic on PTA regimen (1 mg  Wednesday, Saturday and 2.5 mg EOD). CBC stable (Hgb 13.9, PLT 210). No bleeding noted. Noted amiodarone  was increased from 200 mg to 400 mg daily on 6/23. Oral intake last documented at 50%, also receiving nutritional supplements.   Goal of Therapy:  INR 2.5-3.5 Monitor  platelets by anticoagulation protocol: Yes   Plan:  Warfarin 1 mg x1 (PTA regimen) Monitor for s/sx of bleeding, daily INR, CBC Watch for new DDIs and changes in appetite  Thank you for allowing pharmacy to participate in this patient's care,  Morna Breach, PharmD PGY2 Cardiology Pharmacy Resident 01/19/2024 7:03 AM

## 2024-01-19 NOTE — Progress Notes (Signed)
 Advanced Heart Failure Rounding Note  [facility] Cardiologist: Rolan Fuel, MD Chief Complaint: Cardiogenic Shock Subjective:   Moved to ICU on 6/11 for cardiogenic shock.   6/21  lasix  drip increased to 60 mg per hour and  Hypertonic Saline added.  6/25 Hypertonic saline stopped. Given 15 mg  tolvaptan .   - 1900cc urine output; CVP 8-10 - Remains on vaso 0.03-0.02 and levo 4mcg.   Objective:    Vital Signs:   Temp:  [97.3 F (36.3 C)-98.8 F (37.1 C)] 97.3 F (36.3 C) (06/28 0715) Pulse Rate:  [76-100] 79 (06/28 1015) Resp:  [10-29] 16 (06/28 1015) BP: (80-103)/(58-82) 88/65 (06/28 1015) SpO2:  [89 %-100 %] 95 % (06/28 1015) Weight:  [83.9 kg] 83.9 kg (06/28 0632) Last BM Date : 01/17/24  Weight change: Filed Weights   01/17/24 0700 01/18/24 0500 01/19/24 0632  Weight: 85.4 kg 84.8 kg 83.9 kg   Intake/Output:  Intake/Output Summary (Last 24 hours) at 01/19/2024 1031 Last data filed at 01/19/2024 0800 Gross per 24 hour  Intake 1523.16 ml  Output 1900 ml  Net -376.84 ml  CVP 13 General:  elderly / frail appearing.  No respiratory difficulty Neck:  JVD elevated. LIJ CVC Cor: PMI nondisplaced. Regular rate & rhythm. No rubs, gallops or murmurs. Lungs: clear Extremities: no cyanosis, clubbing, rash, +1 BLE edema  Neuro: alert & oriented x 3.  Affect flat.   Labs: Basic Metabolic Panel: Recent Labs  Lab 01/13/24 0347 01/13/24 1651 01/15/24 0418 01/15/24 1200 01/16/24 0304 01/16/24 1133 01/16/24 1409 01/16/24 2146 01/17/24 0130 01/17/24 0538 01/17/24 0928 01/17/24 1715 01/18/24 0103 01/18/24 0423 01/18/24 1400 01/19/24 0453  NA 123*   < > 125* 126*   < > 126*   < > 127*   < > 123* 124* 127* 126* 126* 127* 127*  K 3.6   < > 3.1* 4.3   < > 4.2  --  4.3  --  3.1* 3.3* 4.8  --  3.1* 4.1 3.9  CL 82*   < > 76* 76*   < > 75*  --  72*  --  73* 72* 74*  --  72* 76* 75*  CO2 30   < > 32 35*   < > 39*  --  38*  --  40* 37* 38*  --  39* 40* 37*  GLUCOSE 97   < > 146*  251*   < > 179*  --  149*  --  154* 210* 93  --  138* 110* 153*  BUN 67*   < > 65* 63*   < > 61*  --  66*  --  64* 63* 67*  --  66* 66* 73*  CREATININE 2.16*   < > 1.78* 1.74*   < > 1.67*  --  1.64*  --  1.65* 1.70* 1.72*  --  1.68* 1.77* 1.88*  CALCIUM 8.6*   < > 8.8* 9.1   < > 8.9  --  9.5  --  8.9 9.0 9.3  --  9.3 9.3 9.6  MG 2.7*  --  2.8*  --   --   --   --   --   --  2.4  --   --   --  2.5*  --  2.7*  PHOS  --   --   --  3.0  --  2.4*  --  3.0  --   --   --  3.3  --   --  3.2  --    < > =  values in this interval not displayed.   Liver Function Tests: Recent Labs  Lab 01/16/24 1133 01/16/24 2146 01/17/24 0928 01/17/24 1715 01/18/24 1400  AST  --   --  28  --   --   ALT  --   --  20  --   --   ALKPHOS  --   --  113  --   --   BILITOT  --   --  3.0*  --   --   PROT  --   --  5.7*  --   --   ALBUMIN 3.1* 3.3* 3.2* 3.2* 3.1*    CBC: Recent Labs  Lab 01/14/24 0346 01/15/24 0418 01/17/24 0928 01/18/24 0423 01/19/24 0453  WBC 15.8* 15.4* 16.2* 15.6* 17.3*  HGB 14.3 13.8 13.7 13.3 13.9  HCT 44.2 43.3 42.4 41.6 43.2  MCV 61.9* 61.9* 62.3* 62.2* 63.2*  PLT 169 183 197 191 210   Medications:    Scheduled Medications:  allopurinol   150 mg Oral Daily   amiodarone   400 mg Oral Daily   aspirin  EC  81 mg Oral Daily   Chlorhexidine  Gluconate Cloth  6 each Topical Daily   dapagliflozin  propanediol  10 mg Oral Daily   feeding supplement  237 mL Oral BID BM   fesoterodine   4 mg Oral QODAY   insulin  aspart  0-15 Units Subcutaneous TID WC   midodrine  10 mg Oral Q8H   potassium chloride   40 mEq Oral Daily   tamsulosin   0.4 mg Oral QODAY   warfarin  1 mg Oral ONCE-1600   Warfarin - Pharmacist Dosing Inpatient   Does not apply q1600   Infusions:  milrinone  0.125 mcg/kg/min (01/19/24 0800)   norepinephrine  (LEVOPHED ) Adult infusion 4 mcg/min (01/19/24 0800)   vasopressin  0.02 Units/min (01/19/24 0800)   PRN Medications: ondansetron  (ZOFRAN ) IV, mouth rinse,  traZODone   Assessment/Plan:   1. Acute on chronic systolic CHF -> cardiogenic shock:  - Coronary angiography in 1/23  mild CAD.  - Echo 01/03/24  EF 25-30% RV mod reduced - Severe diuretic resistance; previously on lasix  60mg /hr with hypertonic saline now transitioned to PO.  - Continue bumex  3mg  BID + diamox  500mg  BID.  - Increase midodrine to 10mg  TID  - Wean off vasopressin  today; target MAP 65 - 1900cc urine output yesterday w/ sCr 1.88 from 1.77.  - Mixed venous 60 on milrinone  0.165mcg/kg/min.   2. Mechanical aortic valve: Continue warfarin and ASA 81. - INR 2.6; stable.   3. Paroxysmal Atrial fibrillation/atrial flutter:  - Admitted 1/18 with atrial fibrillation with RVR and CHF.  EF down to 20-25%.  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   - in cardiogenic shock in setting of recurrent AF.  - Failed DC-CV on 6/13 - Maintaining SR.  Now in NSR, off amiodarone  gtt - Continue amiodarone  400 mg daily - Continue coumadin .  - He will need follow up with EP after d/c   4. Ascending aortic aneurysm: 4.3 cm on 4/24 MRA chest.   5. AKI on CKD2: Creatinine 2.14 on admit. Baseline 1.5. Suspect cardio-renal.  - see above   6. Hyperkalemia: resolved.   7. Shock liver - likely 2/2 low output/hepatic congestion   8. Hyponatremia - limit FW, diuresis - Stable; hold tolvaptan .   9. ILD - CT reviewed with P/CCM initially  10. Hypokalemia  - improving  OOB. Continue to mobilize.   Length of Stay: 18    Perian Tedder, DO  01/19/2024, 10:31 AM  Advanced Heart Failure Team Pager [phone] (M-F; 7a - 4p). For after hours, see Amion for on call HF MD   CRITICAL CARE Performed by: Ria Commander   Total critical care time: 36 minutes  Critical care time was exclusive of separately billable procedures and treating other patients.  Critical care was necessary to treat or prevent imminent or life-threatening deterioration.  Critical care  was time spent personally by me on the following activities: development of treatment plan with patient and/or surrogate as well as nursing, discussions with consultants, evaluation of patient's response to treatment, examination of patient, obtaining history from patient or surrogate, ordering and performing treatments and interventions, ordering and review of laboratory studies, ordering and review of radiographic studies, pulse oximetry and re-evaluation of patient's condition.

## 2024-01-20 DIAGNOSIS — R570 Cardiogenic shock: Secondary | ICD-10-CM | POA: Diagnosis not present

## 2024-01-20 LAB — CBC
HCT: 41.3 % (ref 39.0–52.0)
Hemoglobin: 13.1 g/dL (ref 13.0–17.0)
MCH: 20.1 pg — ABNORMAL LOW (ref 26.0–34.0)
MCHC: 31.7 g/dL (ref 30.0–36.0)
MCV: 63.3 fL — ABNORMAL LOW (ref 80.0–100.0)
Platelets: 168 10*3/uL (ref 150–400)
RBC: 6.52 MIL/uL — ABNORMAL HIGH (ref 4.22–5.81)
RDW: 18.9 % — ABNORMAL HIGH (ref 11.5–15.5)
WBC: 15 10*3/uL — ABNORMAL HIGH (ref 4.0–10.5)
nRBC: 0.9 % — ABNORMAL HIGH (ref 0.0–0.2)

## 2024-01-20 LAB — BASIC METABOLIC PANEL WITH GFR
Anion gap: 12 (ref 5–15)
Anion gap: 17 — ABNORMAL HIGH (ref 5–15)
BUN: 76 mg/dL — ABNORMAL HIGH (ref 8–23)
BUN: 77 mg/dL — ABNORMAL HIGH (ref 8–23)
CO2: 43 mmol/L — ABNORMAL HIGH (ref 22–32)
CO2: 37 mmol/L — ABNORMAL HIGH (ref 22–32)
Calcium: 9.3 mg/dL (ref 8.9–10.3)
Calcium: 9.3 mg/dL (ref 8.9–10.3)
Chloride: 74 mmol/L — ABNORMAL LOW (ref 98–111)
Chloride: 75 mmol/L — ABNORMAL LOW (ref 98–111)
Creatinine, Ser: 1.97 mg/dL — ABNORMAL HIGH (ref 0.61–1.24)
Creatinine, Ser: 2 mg/dL — ABNORMAL HIGH (ref 0.61–1.24)
GFR, Estimated: 33 mL/min — ABNORMAL LOW (ref >60)
GFR, Estimated: 33 mL/min — ABNORMAL LOW (ref >60)
Glucose, Bld: 137 mg/dL — ABNORMAL HIGH (ref 70–99)
Glucose, Bld: 141 mg/dL — ABNORMAL HIGH (ref 70–99)
Potassium: 3 mmol/L — ABNORMAL LOW (ref 3.5–5.1)
Potassium: 3.4 mmol/L — ABNORMAL LOW (ref 3.5–5.1)
Sodium: 129 mmol/L — ABNORMAL LOW (ref 135–145)
Sodium: 129 mmol/L — ABNORMAL LOW (ref 135–145)

## 2024-01-20 LAB — COOXEMETRY PANEL
Carboxyhemoglobin: 2.2 % — ABNORMAL HIGH (ref 0.5–1.5)
Carboxyhemoglobin: 1.7 % — ABNORMAL HIGH (ref 0.5–1.5)
Methemoglobin: <0.7 % (ref 0.0–1.5)
Methemoglobin: 1.5 % (ref 0.0–1.5)
O2 Saturation: 86.1 %
O2 Saturation: 63.8 %
Total hemoglobin: 13.4 g/dL (ref 12.0–16.0)
Total hemoglobin: 13.6 g/dL (ref 12.0–16.0)

## 2024-01-20 LAB — GLUCOSE, CAPILLARY
Glucose-Capillary: 137 mg/dL — ABNORMAL HIGH (ref 70–99)
Glucose-Capillary: 152 mg/dL — ABNORMAL HIGH (ref 70–99)
Glucose-Capillary: 143 mg/dL — ABNORMAL HIGH (ref 70–99)
Glucose-Capillary: 161 mg/dL — ABNORMAL HIGH (ref 70–99)

## 2024-01-20 LAB — PROTIME-INR
INR: 2.9 — ABNORMAL HIGH (ref 0.8–1.2)
Prothrombin Time: 32 s — ABNORMAL HIGH (ref 11.4–15.2)

## 2024-01-20 LAB — MAGNESIUM: Magnesium: 2.6 mg/dL — ABNORMAL HIGH (ref 1.7–2.4)

## 2024-01-20 MED ORDER — AMIODARONE HCL IN DEXTROSE 360-4.14 MG/200ML-% IV SOLN
INTRAVENOUS | Status: AC
  Filled 2024-01-20: qty 200

## 2024-01-20 MED ORDER — POTASSIUM CHLORIDE 10 MEQ/100ML IV SOLN
10.0000 meq | INTRAVENOUS | Status: AC
  Administered 2024-01-20 (×3): 10 meq via INTRAVENOUS
  Filled 2024-01-20 (×3): qty 100

## 2024-01-20 MED ORDER — MIDODRINE HCL 5 MG PO TABS
15.0000 mg | ORAL_TABLET | Freq: Three times a day (TID) | ORAL | Status: DC
  Administered 2024-01-20 – 2024-01-25 (×15): 15 mg via ORAL
  Filled 2024-01-20 (×16): qty 3

## 2024-01-20 MED ORDER — POTASSIUM CHLORIDE CRYS ER 20 MEQ PO TBCR
40.0000 meq | EXTENDED_RELEASE_TABLET | ORAL | Status: AC
  Administered 2024-01-20 (×2): 40 meq via ORAL
  Filled 2024-01-20 (×2): qty 2

## 2024-01-20 MED ORDER — BUMETANIDE 2 MG PO TABS
3.0000 mg | ORAL_TABLET | Freq: Two times a day (BID) | ORAL | Status: DC
  Administered 2024-01-20 (×2): 3 mg via ORAL
  Filled 2024-01-20 (×3): qty 1

## 2024-01-20 MED ORDER — POTASSIUM CHLORIDE CRYS ER 20 MEQ PO TBCR
30.0000 meq | EXTENDED_RELEASE_TABLET | Freq: Once | ORAL | Status: AC
  Administered 2024-01-20: 30 meq via ORAL
  Filled 2024-01-20: qty 1

## 2024-01-20 MED ORDER — WARFARIN SODIUM 2.5 MG PO TABS
2.5000 mg | ORAL_TABLET | Freq: Once | ORAL | Status: AC
  Administered 2024-01-20: 2.5 mg via ORAL
  Filled 2024-01-20: qty 1

## 2024-01-20 NOTE — Progress Notes (Signed)
 Advanced Heart Failure Rounding Note  [facility] Cardiologist: Rolan Fuel, MD Chief Complaint: Cardiogenic Shock Subjective:   Moved to ICU on 6/11 for cardiogenic shock.   6/21  lasix  drip increased to 60 mg per hour and  Hypertonic Saline added.  6/25 Hypertonic saline stopped. Given 15 mg  tolvaptan .   - 3.9L urine output yesterday - CVP 7-8; sCr slightly higher.  - off vaso/levo today.  - MAP 65 with BP 70s/50s.   Objective:    Vital Signs:   Temp:  [97.1 F (36.2 C)-97.9 F (36.6 C)] 97.1 F (36.2 C) (06/29 0745) Pulse Rate:  [75-99] 79 (06/29 1015) Resp:  [10-29] 15 (06/29 1015) BP: (76-112)/(57-91) 80/62 (06/29 1015) SpO2:  [78 %-98 %] 96 % (06/29 1015) Weight:  [84 kg] 84 kg (06/29 0500) Last BM Date : 01/17/24  Weight change: Filed Weights   01/18/24 0500 01/19/24 0632 01/20/24 0500  Weight: 84.8 kg 83.9 kg 84 kg   Intake/Output:  Intake/Output Summary (Last 24 hours) at 01/20/2024 1103 Last data filed at 01/20/2024 1044 Gross per 24 hour  Intake 381.31 ml  Output 3650 ml  Net -3268.69 ml  CVP 7-8 General: elderly; frail Neck:  JVP 8 Cor: RRR; no murmurs.  Lungs: CTA Extremities: BLE  Neuro: alert & oriented x 3.  Affect flat.   Labs: Basic Metabolic Panel: Recent Labs  Lab 01/15/24 0418 01/15/24 1200 01/16/24 0304 01/16/24 1133 01/16/24 1409 01/16/24 2146 01/17/24 0130 01/17/24 0538 01/17/24 0928 01/17/24 1715 01/18/24 0103 01/18/24 0423 01/18/24 1400 01/19/24 0453 01/20/24 0521  NA 125* 126*   < > 126*   < > 127*   < > 123*   < > 127* 126* 126* 127* 127* 129*  K 3.1* 4.3   < > 4.2  --  4.3  --  3.1*   < > 4.8  --  3.1* 4.1 3.9 3.0*  CL 76* 76*   < > 75*  --  72*  --  73*   < > 74*  --  72* 76* 75* 74*  CO2 32 35*   < > 39*  --  38*  --  40*   < > 38*  --  39* 40* 37* 43*  GLUCOSE 146* 251*   < > 179*  --  149*  --  154*   < > 93  --  138* 110* 153* 137*  BUN 65* 63*   < > 61*  --  66*  --  64*   < > 67*  --  66* 66* 73* 76*  CREATININE  1.78* 1.74*   < > 1.67*  --  1.64*  --  1.65*   < > 1.72*  --  1.68* 1.77* 1.88* 1.97*  CALCIUM 8.8* 9.1   < > 8.9  --  9.5  --  8.9   < > 9.3  --  9.3 9.3 9.6 9.3  MG 2.8*  --   --   --   --   --   --  2.4  --   --   --  2.5*  --  2.7* 2.6*  PHOS  --  3.0  --  2.4*  --  3.0  --   --   --  3.3  --   --  3.2  --   --    < > = values in this interval not displayed.   Liver Function Tests: Recent Labs  Lab 01/16/24 1133 01/16/24 2146 01/17/24 9071  01/17/24 1715 01/18/24 1400  AST  --   --  28  --   --   ALT  --   --  20  --   --   ALKPHOS  --   --  113  --   --   BILITOT  --   --  3.0*  --   --   PROT  --   --  5.7*  --   --   ALBUMIN 3.1* 3.3* 3.2* 3.2* 3.1*    CBC: Recent Labs  Lab 01/15/24 0418 01/17/24 0928 01/18/24 0423 01/19/24 0453 01/20/24 0521  WBC 15.4* 16.2* 15.6* 17.3* 15.0*  HGB 13.8 13.7 13.3 13.9 13.1  HCT 43.3 42.4 41.6 43.2 41.3  MCV 61.9* 62.3* 62.2* 63.2* 63.3*  PLT 183 197 191 210 168   Medications:    Scheduled Medications:  acetaZOLAMIDE   500 mg Oral BID   allopurinol   150 mg Oral Daily   amiodarone   400 mg Oral Daily   aspirin  EC  81 mg Oral Daily   Chlorhexidine  Gluconate Cloth  6 each Topical Daily   dapagliflozin  propanediol  10 mg Oral Daily   feeding supplement  237 mL Oral BID BM   fesoterodine   4 mg Oral QODAY   insulin  aspart  0-15 Units Subcutaneous TID WC   midodrine  10 mg Oral Q8H   potassium chloride   40 mEq Oral Daily   tamsulosin   0.4 mg Oral QODAY   Warfarin - Pharmacist Dosing Inpatient   Does not apply q1600   Infusions:  amiodarone      milrinone  0.125 mcg/kg/min (01/20/24 0700)   norepinephrine  (LEVOPHED ) Adult infusion 2 mcg/min (01/20/24 0700)   potassium chloride  10 mEq (01/20/24 1044)   vasopressin  Stopped (01/19/24 1209)   PRN Medications: amiodarone , ondansetron  (ZOFRAN ) IV, mouth rinse, traZODone   Assessment/Plan:   1. Acute on chronic systolic CHF -> cardiogenic shock:  - Coronary angiography in 1/23   mild CAD.  - Echo 01/03/24  EF 25-30% RV mod reduced - Severe diuretic resistance; previously on lasix  60mg /hr with hypertonic saline now transitioned to PO.  - Continue bumex  3mg  BID; 3.9L urine output. Hold diamox  today. sCr 1.92.  - Increase midodrine to 15mg  TID - Off all pressors today; mixed venous 82%. Will repeat. If stable plan to D/C milrinone  and transfer to floor.   2. Mechanical aortic valve: Continue warfarin and ASA 81. - INR 2.9, discussed with pharmD.   3. Paroxysmal Atrial fibrillation/atrial flutter:  - Admitted 1/18 with atrial fibrillation with RVR and CHF.  EF down to 20-25%.  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   - in cardiogenic shock in setting of recurrent AF.  - Failed DC-CV on 6/13 - Maintaining SR.  Now in NSR, off amiodarone  gtt - Continue amiodarone  400 mg daily - Continue coumadin .  - He will need follow up with EP after d/c   4. Ascending aortic aneurysm: 4.3 cm on 4/24 MRA chest.   5. AKI on CKD2: Creatinine 2.14 on admit. Baseline 1.5. Suspect cardio-renal.  - see above   6. Hyperkalemia: resolved.   7. Shock liver - likely 2/2 low output/hepatic congestion   8. Hyponatremia - limit FW, diuresis - Stable; hold tolvaptan .   9. ILD - CT reviewed with P/CCM initially  10. Hypokalemia  - improving  OOB. Continue to mobilize.   Length of Stay: 66    Adrian Knabe, DO 01/20/2024, 11:03 AM  Advanced Heart Failure Team Pager  [phone] (M-F; 7a - 4p). For after hours, see Amion for on call HF MD  CRITICAL CARE Performed by: Ria Commander   Total critical care time: 32 minutes  Critical care time was exclusive of separately billable procedures and treating other patients.  Critical care was necessary to treat or prevent imminent or life-threatening deterioration.  Critical care was time spent personally by me on the following activities: development of treatment plan with patient and/or surrogate  as well as nursing, discussions with consultants, evaluation of patient's response to treatment, examination of patient, obtaining history from patient or surrogate, ordering and performing treatments and interventions, ordering and review of laboratory studies, ordering and review of radiographic studies, pulse oximetry and re-evaluation of patient's condition.

## 2024-01-20 NOTE — Progress Notes (Signed)
 ANTICOAGULATION CONSULT NOTE  Pharmacy Consult for Warfarin Indication: afib with mechanical AVR  Allergies  Allergen Reactions   Celecoxib Rash    Other Reaction(s): Unknown    Patient Measurements: Height: 5' 10 (177.8 cm) Weight: 83.9 kg (184 lb 15.5 oz) IBW/kg (Calculated) : 73  Vital Signs: Temp: 97.6 F (36.4 C) (06/29 0415) Temp Source: Oral (06/29 0415) BP: 94/70 (06/29 0630) Pulse Rate: 91 (06/29 0630)  Labs: Recent Labs    01/18/24 0423 01/18/24 1400 01/19/24 0453 01/20/24 0521  HGB 13.3  --  13.9 13.1  HCT 41.6  --  43.2 41.3  PLT 191  --  210 168  LABPROT 29.3*  --  28.8* 32.0*  INR 2.6*  --  2.6* 2.9*  CREATININE 1.68* 1.77* 1.88* 1.97*    Estimated Creatinine Clearance: 29.9 mL/min (A) (by C-G formula based on SCr of 1.97 mg/dL (H)).   Medical History: Past Medical History:  Diagnosis Date   Anticoagulant long-term use    Arthritis    probably in my thumbs (06/26/2012)   Bifascicular block    Chronic combined systolic and diastolic CHF (congestive heart failure) (HCC)    a. 01/2016 Echo: EF 45-50%, gr2 DD, inflat AK (poor acoustic windows);  b. 07/2016 Echo: EF 25-30%, mild LVH, PASP (pt in Afib).   Complication of anesthesia    wake up w/a start; hallucinations (06/26/2012)   Critical Aortic Stenosis    a. 03/2003 s/p SJM #25 mech prosthetic AoV;  b. 07/2016 Echo: EF 25-30% (in setting of AF), AoV area 1.72 cm^2 (VTI), 1.75 cm^2 (Vmean).   Diverticulosis    Gouty arthritis    have had it in both feet, ankles, right knee (06/26/2012)   History of diverticulitis of colon    History of kidney stones    History of pancreatitis    Hypertension    Incomplete RBBB    PAF (paroxysmal atrial fibrillation) (HCC)    a. CHA2DS2VASc = 4-->chronic coumadin  in setting of mech AoV;  b. 07/2016 Recurrent AF RVR.    Medications:  Medications Prior to Admission  Medication Sig Dispense Refill Last Dose/Taking   acetaminophen  (TYLENOL ) 500 MG  tablet Take 500 mg by mouth 2 (two) times daily.   01/01/2024   albuterol  (VENTOLIN  HFA) 108 (90 Base) MCG/ACT inhaler Inhale 2 puffs into the lungs every 4 (four) hours.   01/01/2024   allopurinol  (ZYLOPRIM ) 300 MG tablet Take 150 mg by mouth daily.   01/01/2024   aspirin  EC 81 MG tablet Take 81 mg by mouth daily. Swallow whole.   01/01/2024   clobetasol cream (TEMOVATE) 0.05 % Apply 1 Application topically 2 (two) times daily.   01/01/2024   colchicine  0.6 MG tablet Take 1 tablet (0.6 mg total) by mouth daily as needed (for gout).   Unknown   FARXIGA  10 MG TABS tablet TAKE 1 TABLET BY MOUTH EVERY DAY 30 tablet 6 01/01/2024   FIBER PO Take 2 capsules by mouth 2 (two) times daily.   01/01/2024   folic acid  (FOLVITE ) 1 MG tablet TAKE 1 TABLET(1 MG) BY MOUTH DAILY 90 tablet 3 01/01/2024   metoprolol  succinate (TOPROL -XL) 25 MG 24 hr tablet TAKE 1 TABLET BY MOUTH EVERYDAY AT BEDTIME 90 tablet 1 12/31/2023   potassium chloride  SA (KLOR-CON  M) 20 MEQ tablet Take 1 tablet (20 mEq total) by mouth daily.   01/01/2024   tamsulosin  (FLOMAX ) 0.4 MG CAPS capsule Take 1 capsule (0.4 mg total) by mouth every other day. 90  capsule 3 01/01/2024   tolterodine  (DETROL  LA) 4 MG 24 hr capsule Take 1 capsule (4 mg total) by mouth every other day. Alternating days with the tamsulosin  90 capsule 3 12/31/2023   torsemide  (DEMADEX ) 20 MG tablet Take 3 tablets (60 mg total) by mouth daily.   01/01/2024   warfarin (COUMADIN ) 5 MG tablet Take 0.5-1 tablets (2.5-5 mg total) by mouth See admin instructions. 1/19 and 1/20  take 7.5mg  (1 and 1/2 tab) then take 1 tab (5mg ) daily until seen by MD  Take 1 tablet (5 mg) by mouth on Sundays, Mondays, Wednesdays, Thursdays & Saturdays in the evening. Take 0.5 tablet (2.5 mg) by mouth on Tuesdays & Fridays in the evening. (Patient taking differently: Take 2.5-5 mg by mouth See admin instructions. AS directed by PCP 2.5 mg Daily) 45 tablet 11 12/31/2023 at  8:00 PM   docusate sodium  (COLACE) 100 MG  capsule Take 100 mg by mouth 2 (two) times daily. (Patient not taking: Reported on 01/01/2024)   Not Taking   polyethylene glycol powder (GLYCOLAX /MIRALAX ) 17 GM/SCOOP powder Take 17 g by mouth 2 (two) times daily as needed for moderate constipation or mild constipation. (Patient not taking: Reported on 12/26/2023) 238 g 2    Scheduled:   acetaZOLAMIDE   500 mg Oral BID   allopurinol   150 mg Oral Daily   amiodarone   400 mg Oral Daily   aspirin  EC  81 mg Oral Daily   Chlorhexidine  Gluconate Cloth  6 each Topical Daily   dapagliflozin  propanediol  10 mg Oral Daily   feeding supplement  237 mL Oral BID BM   fesoterodine   4 mg Oral QODAY   insulin  aspart  0-15 Units Subcutaneous TID WC   midodrine  10 mg Oral Q8H   potassium chloride   40 mEq Oral Daily   tamsulosin   0.4 mg Oral QODAY   Warfarin - Pharmacist Dosing Inpatient   Does not apply q1600   Infusions:   milrinone  0.125 mcg/kg/min (01/20/24 0500)   norepinephrine  (LEVOPHED ) Adult infusion 3 mcg/min (01/20/24 0500)   vasopressin  Stopped (01/19/24 1209)   PRN: ondansetron  (ZOFRAN ) IV, mouth rinse, traZODone   Assessment: 83 yo male presenting with worsening shortness of breath. On warfarin PTA for afib with mechanical AVR.    Per patient, dose is 5 mg daily, poor historian. Upon chart review patient should be on 2.5 mg once daily on Monday, Tuesday, Thursday, Friday, and Sunday and 1 mg on Wednesday and Saturday per last PCP note on 12/18/2023. Goal INR is 2.5-3.5.   Pertinent DDI's - s/p amio IV (none PTA, 6/12 >20), PO amio (6/20 >), allopurinol  (PTA). Amiodarone  PO dose increased from 200mg  daily to 400mg  daily today (6/23 >). Oral intake documented at 75%, also receiving nutritional supplements (ensure plus high protein).  6/29 AM: INR 2.9, therapeutic on PTA regimen (1 mg Wednesday, Saturday and 2.5 mg EOD). CBC stable (Hgb 13.1, PLT 168). No bleeding noted. Noted amiodarone  was increased from 200 mg to 400 mg daily on 6/23. Oral  intake last documented at 50%, also receiving nutritional supplements.   Goal of Therapy:  INR 2.5-3.5 Monitor platelets by anticoagulation protocol: Yes   Plan:  Warfarin 2.5 mg x1 (PTA regimen) Monitor for s/sx of bleeding, daily INR, CBC Watch for new DDIs and changes in appetite  Thank you for allowing pharmacy to participate in this patient's care,  Morna Breach, PharmD PGY2 Cardiology Pharmacy Resident 01/20/2024 6:38 AM

## 2024-01-21 DIAGNOSIS — R570 Cardiogenic shock: Secondary | ICD-10-CM | POA: Diagnosis not present

## 2024-01-21 DIAGNOSIS — I5023 Acute on chronic systolic (congestive) heart failure: Secondary | ICD-10-CM | POA: Diagnosis not present

## 2024-01-21 LAB — COOXEMETRY PANEL
Carboxyhemoglobin: 2.2 % — ABNORMAL HIGH (ref 0.5–1.5)
Methemoglobin: 0.7 % (ref 0.0–1.5)
O2 Saturation: 61.1 %
Total hemoglobin: 13.4 g/dL (ref 12.0–16.0)

## 2024-01-21 LAB — BASIC METABOLIC PANEL WITH GFR
Anion gap: 13 (ref 5–15)
BUN: 79 mg/dL — ABNORMAL HIGH (ref 8–23)
CO2: 39 mmol/L — ABNORMAL HIGH (ref 22–32)
Calcium: 9.7 mg/dL (ref 8.9–10.3)
Chloride: 79 mmol/L — ABNORMAL LOW (ref 98–111)
Creatinine, Ser: 2.18 mg/dL — ABNORMAL HIGH (ref 0.61–1.24)
GFR, Estimated: 29 mL/min — ABNORMAL LOW (ref >60)
Glucose, Bld: 161 mg/dL — ABNORMAL HIGH (ref 70–99)
Potassium: 3.8 mmol/L (ref 3.5–5.1)
Sodium: 131 mmol/L — ABNORMAL LOW (ref 135–145)

## 2024-01-21 LAB — MAGNESIUM: Magnesium: 2.7 mg/dL — ABNORMAL HIGH (ref 1.7–2.4)

## 2024-01-21 LAB — CBC
HCT: 42.1 % (ref 39.0–52.0)
Hemoglobin: 13.4 g/dL (ref 13.0–17.0)
MCH: 20.3 pg — ABNORMAL LOW (ref 26.0–34.0)
MCHC: 31.8 g/dL (ref 30.0–36.0)
MCV: 63.8 fL — ABNORMAL LOW (ref 80.0–100.0)
Platelets: 155 10*3/uL (ref 150–400)
RBC: 6.6 MIL/uL — ABNORMAL HIGH (ref 4.22–5.81)
RDW: 19.2 % — ABNORMAL HIGH (ref 11.5–15.5)
WBC: 14.2 10*3/uL — ABNORMAL HIGH (ref 4.0–10.5)
nRBC: 1.3 % — ABNORMAL HIGH (ref 0.0–0.2)

## 2024-01-21 LAB — GLUCOSE, CAPILLARY
Glucose-Capillary: 165 mg/dL — ABNORMAL HIGH (ref 70–99)
Glucose-Capillary: 217 mg/dL — ABNORMAL HIGH (ref 70–99)
Glucose-Capillary: 195 mg/dL — ABNORMAL HIGH (ref 70–99)
Glucose-Capillary: 166 mg/dL — ABNORMAL HIGH (ref 70–99)

## 2024-01-21 LAB — PROTIME-INR
INR: 3.2 — ABNORMAL HIGH (ref 0.8–1.2)
Prothrombin Time: 33.9 s — ABNORMAL HIGH (ref 11.4–15.2)

## 2024-01-21 MED ORDER — METOLAZONE 5 MG PO TABS
5.0000 mg | ORAL_TABLET | Freq: Once | ORAL | Status: AC
  Administered 2024-01-21: 5 mg via ORAL
  Filled 2024-01-21: qty 1

## 2024-01-21 MED ORDER — FUROSEMIDE 10 MG/ML IJ SOLN
120.0000 mg | Freq: Two times a day (BID) | INTRAVENOUS | Status: DC
  Administered 2024-01-21: 120 mg via INTRAVENOUS
  Filled 2024-01-21 (×2): qty 12
  Filled 2024-01-21: qty 10

## 2024-01-21 MED ORDER — FUROSEMIDE 10 MG/ML IJ SOLN
30.0000 mg/h | INTRAVENOUS | Status: DC
  Administered 2024-01-21 – 2024-01-24 (×10): 30 mg/h via INTRAVENOUS
  Filled 2024-01-21 (×11): qty 20

## 2024-01-21 MED ORDER — POTASSIUM CHLORIDE CRYS ER 20 MEQ PO TBCR
60.0000 meq | EXTENDED_RELEASE_TABLET | Freq: Once | ORAL | Status: AC
  Administered 2024-01-21: 60 meq via ORAL
  Filled 2024-01-21: qty 3

## 2024-01-21 MED ORDER — WARFARIN SODIUM 1 MG PO TABS
1.0000 mg | ORAL_TABLET | Freq: Once | ORAL | Status: AC
  Administered 2024-01-21: 1 mg via ORAL
  Filled 2024-01-21: qty 1

## 2024-01-21 MED ORDER — FUROSEMIDE 10 MG/ML IJ SOLN: 80.0000 mg | Freq: Two times a day (BID) | INTRAMUSCULAR | Status: DC

## 2024-01-21 NOTE — Progress Notes (Signed)
 ANTICOAGULATION CONSULT NOTE  Pharmacy Consult for Warfarin Indication: afib with mechanical AVR  Allergies  Allergen Reactions   Celecoxib Rash    Other Reaction(s): Unknown    Patient Measurements: Height: 5' 10 (177.8 cm) Weight: 84.4 kg (186 lb 1.1 oz) IBW/kg (Calculated) : 73  Vital Signs: Temp: 97.5 F (36.4 C) (06/30 0819) Temp Source: Oral (06/30 0819) BP: 87/70 (06/30 0600) Pulse Rate: 83 (06/30 0600)  Labs: Recent Labs    01/19/24 0453 01/20/24 0521 01/20/24 1510 01/21/24 0453  HGB 13.9 13.1  --  13.4  HCT 43.2 41.3  --  42.1  PLT 210 168  --  155  LABPROT 28.8* 32.0*  --  33.9*  INR 2.6* 2.9*  --  3.2*  CREATININE 1.88* 1.97* 2.00* 2.18*    Estimated Creatinine Clearance: 27 mL/min (A) (by C-G formula based on SCr of 2.18 mg/dL (H)).   Medical History: Past Medical History:  Diagnosis Date   Anticoagulant long-term use    Arthritis    probably in my thumbs (06/26/2012)   Bifascicular block    Chronic combined systolic and diastolic CHF (congestive heart failure) (HCC)    a. 01/2016 Echo: EF 45-50%, gr2 DD, inflat AK (poor acoustic windows);  b. 07/2016 Echo: EF 25-30%, mild LVH, PASP (pt in Afib).   Complication of anesthesia    wake up w/a start; hallucinations (06/26/2012)   Critical Aortic Stenosis    a. 03/2003 s/p SJM #25 mech prosthetic AoV;  b. 07/2016 Echo: EF 25-30% (in setting of AF), AoV area 1.72 cm^2 (VTI), 1.75 cm^2 (Vmean).   Diverticulosis    Gouty arthritis    have had it in both feet, ankles, right knee (06/26/2012)   History of diverticulitis of colon    History of kidney stones    History of pancreatitis    Hypertension    Incomplete RBBB    PAF (paroxysmal atrial fibrillation) (HCC)    a. CHA2DS2VASc = 4-->chronic coumadin  in setting of mech AoV;  b. 07/2016 Recurrent AF RVR.    Medications:  Medications Prior to Admission  Medication Sig Dispense Refill Last Dose/Taking   acetaminophen  (TYLENOL ) 500 MG tablet  Take 500 mg by mouth 2 (two) times daily.   01/01/2024   albuterol  (VENTOLIN  HFA) 108 (90 Base) MCG/ACT inhaler Inhale 2 puffs into the lungs every 4 (four) hours.   01/01/2024   allopurinol  (ZYLOPRIM ) 300 MG tablet Take 150 mg by mouth daily.   01/01/2024   aspirin  EC 81 MG tablet Take 81 mg by mouth daily. Swallow whole.   01/01/2024   clobetasol cream (TEMOVATE) 0.05 % Apply 1 Application topically 2 (two) times daily.   01/01/2024   colchicine  0.6 MG tablet Take 1 tablet (0.6 mg total) by mouth daily as needed (for gout).   Unknown   FARXIGA  10 MG TABS tablet TAKE 1 TABLET BY MOUTH EVERY DAY 30 tablet 6 01/01/2024   FIBER PO Take 2 capsules by mouth 2 (two) times daily.   01/01/2024   folic acid  (FOLVITE ) 1 MG tablet TAKE 1 TABLET(1 MG) BY MOUTH DAILY 90 tablet 3 01/01/2024   metoprolol  succinate (TOPROL -XL) 25 MG 24 hr tablet TAKE 1 TABLET BY MOUTH EVERYDAY AT BEDTIME 90 tablet 1 12/31/2023   potassium chloride  SA (KLOR-CON  M) 20 MEQ tablet Take 1 tablet (20 mEq total) by mouth daily.   01/01/2024   tamsulosin  (FLOMAX ) 0.4 MG CAPS capsule Take 1 capsule (0.4 mg total) by mouth every other day. 90  capsule 3 01/01/2024   tolterodine  (DETROL  LA) 4 MG 24 hr capsule Take 1 capsule (4 mg total) by mouth every other day. Alternating days with the tamsulosin  90 capsule 3 12/31/2023   torsemide  (DEMADEX ) 20 MG tablet Take 3 tablets (60 mg total) by mouth daily.   01/01/2024   warfarin (COUMADIN ) 5 MG tablet Take 0.5-1 tablets (2.5-5 mg total) by mouth See admin instructions. 1/19 and 1/20  take 7.5mg  (1 and 1/2 tab) then take 1 tab (5mg ) daily until seen by MD  Take 1 tablet (5 mg) by mouth on Sundays, Mondays, Wednesdays, Thursdays & Saturdays in the evening. Take 0.5 tablet (2.5 mg) by mouth on Tuesdays & Fridays in the evening. (Patient taking differently: Take 2.5-5 mg by mouth See admin instructions. AS directed by PCP 2.5 mg Daily) 45 tablet 11 12/31/2023 at  8:00 PM   docusate sodium  (COLACE) 100 MG capsule Take  100 mg by mouth 2 (two) times daily. (Patient not taking: Reported on 01/01/2024)   Not Taking   polyethylene glycol powder (GLYCOLAX /MIRALAX ) 17 GM/SCOOP powder Take 17 g by mouth 2 (two) times daily as needed for moderate constipation or mild constipation. (Patient not taking: Reported on 12/26/2023) 238 g 2    Scheduled:   acetaZOLAMIDE   500 mg Oral BID   allopurinol   150 mg Oral Daily   amiodarone   400 mg Oral Daily   aspirin  EC  81 mg Oral Daily   Chlorhexidine  Gluconate Cloth  6 each Topical Daily   feeding supplement  237 mL Oral BID BM   fesoterodine   4 mg Oral QODAY   insulin  aspart  0-15 Units Subcutaneous TID WC   midodrine  15 mg Oral Q8H   potassium chloride   40 mEq Oral Daily   tamsulosin   0.4 mg Oral QODAY   warfarin  1 mg Oral ONCE-1600   Warfarin - Pharmacist Dosing Inpatient   Does not apply q1600   Infusions:   furosemide      milrinone  0.125 mcg/kg/min (01/21/24 0155)   norepinephrine  (LEVOPHED ) Adult infusion Stopped (01/20/24 0801)   PRN: ondansetron  (ZOFRAN ) IV, mouth rinse, traZODone   Assessment: 83 yo male presenting with worsening shortness of breath. On warfarin PTA for afib with mechanical AVR.    Per patient, dose is 5 mg daily, poor historian. Upon chart review patient should be on 2.5 mg once daily on Monday, Tuesday, Thursday, Friday, and Sunday and 1 mg on Wednesday and Saturday per last PCP note on 12/18/2023. Goal INR is 2.5-3.5.   Pertinent DDI's - s/p amio IV (none PTA, 6/12 >20), PO amio (6/20 >), allopurinol  (PTA). Amiodarone  PO dose increased from 200mg  daily to 400mg  daily today (6/23 >). Oral intake documented at 75%, also receiving nutritional supplements (ensure plus high protein).  INR 3.2 therapeutic but up trend. CBC stable (Hgb 13.1, PLT 168). No bleeding noted. Noted amiodarone  was increased from 200 mg to 400 mg daily on 6/23. Oral intake last documented at 50%, also receiving nutritional supplements.   Goal of Therapy:  INR  2.5-3.5 Monitor platelets by anticoagulation protocol: Yes   Plan:  Warfarin 1 mg x1 Monitor for s/sx of bleeding, daily INR, CBC Watch for new DDIs and changes in appetite   Olam Chalk Pharm.D. CPP, BCPS Clinical Pharmacist [phone] 01/21/2024 8:48 AM

## 2024-01-21 NOTE — Progress Notes (Addendum)
 Inpatient Rehab Admissions:  Inpatient Rehab Consult received.  I met with patient at the bedside for rehabilitation assessment and to discuss goals and expectations of an inpatient rehab admission.  Pt was lethargic but did discuss average length of stay, insurance authorization requirement and discharge home after completion of CIR. Pt was unsure if he wanted to pursue CIR at this time. Pt gave permission to contact family. Attempted to call pt's wife Consuelo and Daughter Sari. Left messages; awaiting return call. Will continue to follow.   1301: Sari returned call. Discussed CIR goals and expectations. She acknowledged understanding. She is supportive of pt pursuing CIR. She confirmed that family will be able to provide 24/7 support for pt after discharge.    Signed: Tinnie Yvone Cohens, MS, CCC-SLP Admissions Coordinator [phone]

## 2024-01-21 NOTE — Progress Notes (Signed)
 Poor UOP with increased diuretic regimen this morning. Discussed with Dr. Rolan, will restart lasix  gtt at 30 mg/hr.   Beckey Coe, AGACNP-BC  Advanced Heart Failure Team  01/21/24

## 2024-01-21 NOTE — Progress Notes (Addendum)
 Advanced Heart Failure Rounding Note  [facility] Cardiologist: Rolan Fuel, MD Chief Complaint: Cardiogenic Shock Subjective:   Moved to ICU on 6/11 for cardiogenic shock.   6/21  lasix  drip increased to 60 mg per hour and  Hypertonic Saline added.  6/25 Hypertonic saline stopped. Given 15 mg  tolvaptan .   - 1.9L urine output yesterday - CVP 11/12; sCr slightly higher.  - off vaso/levo today.  - On milrinone  0.125 - MAP 70s-80s with SBP 90s.   Objective:    Vital Signs:   Temp:  [97.1 F (36.2 C)-97.7 F (36.5 C)] 97.6 F (36.4 C) (06/30 0317) Pulse Rate:  [75-91] 83 (06/30 0600) Resp:  [10-29] 14 (06/30 0600) BP: (75-99)/(56-79) 87/70 (06/30 0600) SpO2:  [90 %-98 %] 97 % (06/30 0600) Weight:  [84.4 kg] 84.4 kg (06/30 0600) Last BM Date : 01/17/24  Weight change: Filed Weights   01/19/24 0632 01/20/24 0500 01/21/24 0600  Weight: 83.9 kg 84 kg 84.4 kg   Intake/Output:  Intake/Output Summary (Last 24 hours) at 01/21/2024 0709 Last data filed at 01/21/2024 0453 Gross per 24 hour  Intake 614.61 ml  Output 1925 ml  Net -1310.39 ml  CVP 11/12 General:  elderly / frail appearing.  No respiratory difficulty Neck: supple. JVD elevated.  Cor: PMI nondisplaced. Regular rate & rhythm. No rubs, gallops or murmurs. Lungs: clear Extremities: no cyanosis, clubbing, rash, +1-2 BLE edema  Neuro: alert & oriented x 3.  Affect flat.   Labs: Basic Metabolic Panel: Recent Labs  Lab 01/15/24 1200 01/16/24 0304 01/16/24 1133 01/16/24 1409 01/16/24 2146 01/17/24 0130 01/17/24 9461 01/17/24 9071 01/17/24 1715 01/18/24 0103 01/18/24 0423 01/18/24 1400 01/19/24 0453 01/20/24 0521 01/20/24 1510 01/21/24 0453  NA 126*   < > 126*   < > 127*   < > 123*   < > 127*   < > 126* 127* 127* 129* 129* 131*  K 4.3   < > 4.2  --  4.3  --  3.1*   < > 4.8  --  3.1* 4.1 3.9 3.0* 3.4* 3.8  CL 76*   < > 75*  --  72*  --  73*   < > 74*  --  72* 76* 75* 74* 75* 79*  CO2 35*   < > 39*  --  38*  --   40*   < > 38*  --  39* 40* 37* 43* 37* 39*  GLUCOSE 251*   < > 179*  --  149*  --  154*   < > 93  --  138* 110* 153* 137* 141* 161*  BUN 63*   < > 61*  --  66*  --  64*   < > 67*  --  66* 66* 73* 76* 77* 79*  CREATININE 1.74*   < > 1.67*  --  1.64*  --  1.65*   < > 1.72*  --  1.68* 1.77* 1.88* 1.97* 2.00* 2.18*  CALCIUM 9.1   < > 8.9  --  9.5  --  8.9   < > 9.3  --  9.3 9.3 9.6 9.3 9.3 9.7  MG  --   --   --   --   --   --  2.4  --   --   --  2.5*  --  2.7* 2.6*  --  2.7*  PHOS 3.0  --  2.4*  --  3.0  --   --   --  3.3  --   --  3.2  --   --   --   --    < > = values in this interval not displayed.   Liver Function Tests: Recent Labs  Lab 01/16/24 1133 01/16/24 2146 01/17/24 0928 01/17/24 1715 01/18/24 1400  AST  --   --  28  --   --   ALT  --   --  20  --   --   ALKPHOS  --   --  113  --   --   BILITOT  --   --  3.0*  --   --   PROT  --   --  5.7*  --   --   ALBUMIN 3.1* 3.3* 3.2* 3.2* 3.1*    CBC: Recent Labs  Lab 01/17/24 0928 01/18/24 0423 01/19/24 0453 01/20/24 0521 01/21/24 0453  WBC 16.2* 15.6* 17.3* 15.0* 14.2*  HGB 13.7 13.3 13.9 13.1 13.4  HCT 42.4 41.6 43.2 41.3 42.1  MCV 62.3* 62.2* 63.2* 63.3* 63.8*  PLT 197 191 210 168 155   Medications:    Scheduled Medications:  acetaZOLAMIDE   500 mg Oral BID   allopurinol   150 mg Oral Daily   amiodarone   400 mg Oral Daily   aspirin  EC  81 mg Oral Daily   bumetanide   3 mg Oral BID   Chlorhexidine  Gluconate Cloth  6 each Topical Daily   dapagliflozin  propanediol  10 mg Oral Daily   feeding supplement  237 mL Oral BID BM   fesoterodine   4 mg Oral QODAY   insulin  aspart  0-15 Units Subcutaneous TID WC   midodrine  15 mg Oral Q8H   potassium chloride   40 mEq Oral Daily   tamsulosin   0.4 mg Oral QODAY   Warfarin - Pharmacist Dosing Inpatient   Does not apply q1600   Infusions:  milrinone  0.125 mcg/kg/min (01/21/24 0155)   norepinephrine  (LEVOPHED ) Adult infusion Stopped (01/20/24 0801)   PRN  Medications: ondansetron  (ZOFRAN ) IV, mouth rinse, traZODone   Assessment/Plan:  1. Acute on chronic systolic CHF -> cardiogenic shock:  - Coronary angiography in 1/23  mild CAD.  - Echo 01/03/24  EF 25-30% RV mod reduced - Severe diuretic resistance; previously on lasix  60mg /hr with hypertonic saline now transitioned to PO.  - Hold bumex  today. Give lasix  120 IV BID and diamox  500 mg BID; 1.9L urine output. sCr 2.18. Will follow this afternoon for possible metolazone .  - Continue midodrine to 15mg  TID - Hold SGLT2i while he is with purwick and minimally active. Can restart when more mobile.  - Off all pressors today; mixed venous 61%. Continue milrinone  with elevated CVP and increased renal function today.   2. Mechanical aortic valve: Continue warfarin and ASA 81. - INR 3.2, discussed with pharmD.   3. Paroxysmal Atrial fibrillation/atrial flutter:  - Admitted 1/18 with atrial fibrillation with RVR and CHF.  EF down to 20-25%.  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   - In cardiogenic shock in setting of recurrent AF.  - Failed DC-CV on 6/13 - Maintaining SR.  Now in NSR, off amiodarone  gtt - Continue amiodarone  400 mg daily - Continue coumadin .  - He will need follow up with EP after d/c   4. Ascending aortic aneurysm: 4.3 cm on 4/24 MRA chest.   5. AKI on CKD2: Creatinine 2.14 on admit. Baseline 1.5. Suspect cardio-renal.  - see above   6. Hyperkalemia: resolved.   7. Shock liver - likely 2/2 low output/hepatic congestion  8. Hyponatremia - limit FW, diuresis - Stable; hold tolvaptan .   9. ILD - CT reviewed with P/CCM initially  10. Hypokalemia  - stable  Mobilize! OOB.   Length of Stay: 20   Beckey LITTIE Coe, NP 01/21/2024, 7:09 AM  Advanced Heart Failure Team Pager [phone] (M-F; 7a - 4p). For after hours, see Amion for on call HF MD  CRITICAL CARE Performed by: Beckey LITTIE Coe  Total critical care time: 14 minutes  Critical care  time was exclusive of separately billable procedures and treating other patients.  Critical care was necessary to treat or prevent imminent or life-threatening deterioration.  Critical care was time spent personally by me on the following activities: development of treatment plan with patient and/or surrogate as well as nursing, discussions with consultants, evaluation of patient's response to treatment, examination of patient, obtaining history from patient or surrogate, ordering and performing treatments and interventions, ordering and review of laboratory studies, ordering and review of radiographic studies, pulse oximetry and re-evaluation of patient's condition.  Patient seen with NP, I formulated the plan and agree with the above note.   CVP 16-17 today, I/Os net negative 1310 yesterday.  Creatinine 2 => 2.18, stable BUN. HCO3 39. Co-ox 61% on milrinone  0.125, remains on midodrine 15 tid to maintain MAP, SBP 90s.   No dyspnea but weak.  Has not been out of bed this weekend.   General: NAD Neck: JVP 16 cm, no thyromegaly or thyroid  nodule.  Lungs: Clear to auscultation bilaterally with normal respiratory effort. CV: Nondisplaced PMI.  Heart regular S1/S2, no S3/S4, no murmur. 1+ ankle edema.  Abdomen: Soft, nontender, no hepatosplenomegaly, no distention.  Skin: Intact without lesions or rashes.  Neurologic: Alert and oriented x 3.  Psych: Normal affect. Extremities: No clubbing or cyanosis.  HEENT: Normal.   Patient remains significantly volume overloaded with cardiorenal syndrome.  CVP 16-17.  Co-ox adequate.  - Continue milrinone  0.125.  - Continue midodrine for now, wean off as able.  - Needs further diuresis => Lasix  120 mg IV bid today with acetazolamide  500 mg bid and metolazone  5 mg x 1 today.  Replace K.  If poor response, back to Lasix  gtt.   Needs PT/OT, mobilize.   Continue warfarin/ASA 81 for mechanical AoV with PAF.   He remains in NSR on amiodarone , continue.    CRITICAL CARE Performed by: Ezra Shuck  Total critical care time: 40 minutes  Critical care time was exclusive of separately billable procedures and treating other patients.  Critical care was necessary to treat or prevent imminent or life-threatening deterioration.  Critical care was time spent personally by me on the following activities: development of treatment plan with patient and/or surrogate as well as nursing, discussions with consultants, evaluation of patient's response to treatment, examination of patient, obtaining history from patient or surrogate, ordering and performing treatments and interventions, ordering and review of laboratory studies, ordering and review of radiographic studies, pulse oximetry and re-evaluation of patient's condition.  Ezra Shuck 01/21/2024 9:48 AM

## 2024-01-21 NOTE — Progress Notes (Signed)
 Physical Therapy Treatment Patient Details Name: Adrian Lambert MRN: [id number] DOB: 12/14/1940 Today's Date: 01/21/2024   History of Present Illness Pt is 83 yo presenting to [facility] ED on 6/10 due to shortness of breath Developed cardiogenic shock, progressive hypotension and signs of hyperperfusion. Admitted to ICU for inotropes. PMH: arthritis, systolic and diastolic CHF, diverticulosis, HTN, incomplete RBBB, PAF    PT Comments  Agreeable to work on transfer training and get out of bed with therapy assist today. Bed mobility up to mod assist. Max assist to stand with stedy from elevated surface. Stood with mod assist a couple of times from stedy paddles in perched position. Needs multimodal cues to pull forward. Tolerated LE exercises well. Stood <1 min each attempt. Sat edge of chair several minutes with bil UE support and back unsupported. Patient will continue to benefit from skilled physical therapy services to further improve independence with functional mobility.     If plan is discharge home, recommend the following: Help with stairs or ramp for entrance;Assist for transportation;Assistance with cooking/housework;Two people to help with walking and/or transfers;A lot of help with bathing/dressing/bathroom   Can travel by private vehicle        Equipment Recommendations  None recommended by PT    Recommendations for Other Services       Precautions / Restrictions Precautions Precautions: Fall Recall of Precautions/Restrictions: Intact Precaution/Restrictions Comments: watch O2 sats, HR, BP Restrictions Weight Bearing Restrictions Per Provider Order: No     Mobility  Bed Mobility Overal bed mobility: Needs Assistance Bed Mobility: Rolling, Sidelying to Sit Rolling: Min assist Sidelying to sit: Mod assist, HOB elevated       General bed mobility comments: Min assist to roll and sequence LEs off bed with mod assist for trunk support to rise to EOB. Cues for technique.     Transfers Overall transfer level: Needs assistance Equipment used: Ambulation equipment used Transfers: Sit to/from Stand, Bed to chair/wheelchair/BSC Sit to Stand: Max assist, From elevated surface, Via lift equipment (stedy)           General transfer comment: Quite weak today. Bed elevated and Stedy utilized with max assist for boost to stand. Initially pushing posteriorly but with multimodal cues able to rise and stand in stedy. Stood from stedy paddles x2 with mod assist for boost. Transfer to chair with Stedy. Tolerated each stand <1 minute with cues for upright posture. Transfer via Lift Equipment: Stedy  Ambulation/Gait             Pre-gait activities: Standing in stedy, progressed with lateral weight shifting, upright stance with cues for hip extension, and mini knee bends.     Stairs             Wheelchair Mobility     Tilt Bed    Modified Rankin (Stroke Patients Only)       Balance Overall balance assessment: Needs assistance Sitting-balance support: Bilateral upper extremity supported, Feet supported Sitting balance-Leahy Scale: Poor Sitting balance - Comments: CGA EOB   Standing balance support: Bilateral upper extremity supported, During functional activity, Reliant on assistive device for balance Standing balance-Leahy Scale: Poor Standing balance comment: BIL UEs on stedy                            Communication Communication Communication: Impaired Factors Affecting Communication: Reduced clarity of speech  Cognition Arousal: Alert Behavior During Therapy: WFL for tasks assessed/performed   PT - Cognitive impairments:  No family/caregiver present to determine baseline                         Following commands: Intact      Cueing Cueing Techniques: Verbal cues  Exercises General Exercises - Lower Extremity Ankle Circles/Pumps: AROM, Both, 10 reps, Supine Quad Sets: AROM, Both, 10 reps, Supine Gluteal Sets:  Strengthening, Both, 10 reps, Supine Long Arc Quad: AROM, Both, 10 reps, Seated Mini-Sqauts: Strengthening, Both, 5 reps, Standing    General Comments General comments (skin integrity, edema, etc.): Pre-activity in bed BP 95/71 (MAP 80) HR 83; SpO2 95% on 2L. Post activity BP 99/65 HR 87, SpO2 94% on 2L.      Pertinent Vitals/Pain Pain Assessment Pain Assessment: No/denies pain    Home Living                          Prior Function            PT Goals (current goals can now be found in the care plan section) Acute Rehab PT Goals Patient Stated Goal: to return home soon PT Goal Formulation: With patient Time For Goal Achievement: 01/31/24 Potential to Achieve Goals: Good Progress towards PT goals: Progressing toward goals    Frequency    Min 2X/week      PT Plan      Co-evaluation              AM-PAC PT 6 Clicks Mobility   Outcome Measure  Help needed turning from your back to your side while in a flat bed without using bedrails?: A Little Help needed moving from lying on your back to sitting on the side of a flat bed without using bedrails?: A Lot Help needed moving to and from a bed to a chair (including a wheelchair)?: Total Help needed standing up from a chair using your arms (e.g., wheelchair or bedside chair)?: Total Help needed to walk in hospital room?: Total Help needed climbing 3-5 steps with a railing? : Total 6 Click Score: 9    End of Session Equipment Utilized During Treatment: Gait belt Activity Tolerance: Other (comment) (weakness) Patient left: in chair;with call bell/phone within reach;with SCD's reapplied Nurse Communication: Mobility status;Need for lift equipment (recommend stedy +2 assist) PT Visit Diagnosis: Unsteadiness on feet (R26.81);Muscle weakness (generalized) (M62.81)     Time: 8948-8875 PT Time Calculation (min) (ACUTE ONLY): 33 min  Charges:    $Therapeutic Exercise: 8-22 mins $Therapeutic Activity:  8-22 mins PT General Charges $$ ACUTE PT VISIT: 1 Visit                     Adrian Lambert, PT, DPT [facility]  Rehabilitation Services Physical Therapist Office: [phone] Website: [email or web address]    Adrian Lambert 01/21/2024, 1:48 PM

## 2024-01-21 NOTE — PMR Pre-admission (Shared)
 PMR Admission Coordinator Pre-Admission Assessment  Patient: Adrian Lambert is an 83 y.o., male MRN: [id number] DOB: 02-18-1941 Height: 5' 10 (177.8 cm) Weight: 84.4 kg  Insurance Information HMO: ***    PPO: ***     PCP:      IPA:      80/20:      OTHER:  PRIMARY: BCBS Medicare      Policy#: [id number]      Subscriber: patient CM Name: ***      Phone#: ***     Fax#: *** Pre-Cert#: ***      Employer: *** Benefits:  Phone #: ***     Name: *** Eff. Date: ***     Deduct: ***      Out of Pocket Max: ***      Life Max: *** CIR: ***      SNF: *** Outpatient: ***     Co-Pay: *** Home Health: ***      Co-Pay: *** DME: ***     Co-Pay: *** Providers: in-network SECONDARY:       Policy#:      Phone#:   Financial Counselor:       Phone#:   The Data processing manager" for patients in Inpatient Rehabilitation Facilities with attached "Privacy Act Statement-Health Care Records" was provided and verbally reviewed with: {CHL IP Patient Family WJ:[id number]}  Emergency Contact Information Contact Information     Name Relation Home Work Mobile   [place] Spouse   [phone]      Other Contacts     Name Relation Home Work Mobile   Hicks,Wendy Daughter   [phone]       Current Medical History  Patient Admitting Diagnosis: cardiogenic shock, acute CHF History of Present Illness: ***    Patient's medical record from [facility] has been reviewed by the rehabilitation admission coordinator and physician.  Past Medical History  Past Medical History:  Diagnosis Date   Anticoagulant long-term use    Arthritis    probably in my thumbs (06/26/2012)   Bifascicular block    Chronic combined systolic and diastolic CHF (congestive heart failure) (HCC)    a. 01/2016 Echo: EF 45-50%, gr2 DD, inflat AK (poor acoustic windows);  b. 07/2016 Echo: EF 25-30%, mild LVH, PASP (pt in Afib).   Complication of anesthesia    wake up w/a start;  hallucinations (06/26/2012)   Critical Aortic Stenosis    a. 03/2003 s/p SJM #25 mech prosthetic AoV;  b. 07/2016 Echo: EF 25-30% (in setting of AF), AoV area 1.72 cm^2 (VTI), 1.75 cm^2 (Vmean).   Diverticulosis    Gouty arthritis    have had it in both feet, ankles, right knee (06/26/2012)   History of diverticulitis of colon    History of kidney stones    History of pancreatitis    Hypertension    Incomplete RBBB    PAF (paroxysmal atrial fibrillation) (HCC)    a. CHA2DS2VASc = 4-->chronic coumadin  in setting of mech AoV;  b. 07/2016 Recurrent AF RVR.    Has the patient had major surgery during 100 days prior to admission? No  Family History   family history includes Diabetes in his mother; Heart failure in his mother; Prostate cancer in his father.  Current Medications  Current Facility-Administered Medications:    acetaZOLAMIDE  (DIAMOX ) tablet 500 mg, 500 mg, Oral, BID, Sabharwal, Aditya, DO, 500 mg at 01/21/24 0911   allopurinol  (ZYLOPRIM ) tablet 150 mg, 150 mg, Oral, Daily, Simmons, Brittainy M, PA-C,  150 mg at 01/21/24 9087   amiodarone  (PACERONE ) tablet 400 mg, 400 mg, Oral, Daily, Stoner, Benjamin J, MD, 400 mg at 01/21/24 9089   aspirin  EC tablet 81 mg, 81 mg, Oral, Daily, Marcine Catalan M, PA-C, 81 mg at 01/21/24 9087   Chlorhexidine  Gluconate Cloth 2 % PADS 6 each, 6 each, Topical, Daily, Danford, Lonni SQUIBB, MD, 6 each at 01/21/24 0913   feeding supplement (ENSURE PLUS HIGH PROTEIN) liquid 237 mL, 237 mL, Oral, BID BM, Zenaida Morene PARAS, MD, 237 mL at 01/21/24 1326   fesoterodine  (TOVIAZ ) tablet 4 mg, 4 mg, Oral, QODAY, Simmons, Brittainy M, PA-C, 4 mg at 01/20/24 9075   furosemide  (LASIX ) 200 mg in dextrose  5 % 100 mL (2 mg/mL) infusion, 30 mg/hr, Intravenous, Continuous, Hayes Lander L, NP   insulin  aspart (novoLOG ) injection 0-15 Units, 0-15 Units, Subcutaneous, TID WC, Sabharwal, Aditya, DO, 5 Units at 01/21/24 1148   midodrine (PROAMATINE) tablet 15 mg, 15 mg,  Oral, Q8H, Sabharwal, Aditya, DO, 15 mg at 01/21/24 1325   milrinone  (PRIMACOR ) 20 MG/100 ML (0.2 mg/mL) infusion, 0.125 mcg/kg/min, Intravenous, Continuous, Zenaida Morene PARAS, MD, Last Rate: 3.14 mL/hr at 01/21/24 1100, 0.125 mcg/kg/min at 01/21/24 1100   ondansetron  (ZOFRAN ) injection 4 mg, 4 mg, Intravenous, Q6H PRN, Lee, [place], NP, 4 mg at 01/10/24 1710   Oral care mouth rinse, 15 mL, Mouth Rinse, PRN, Bensimhon, Daniel R, MD   potassium chloride  (KLOR-CON  M) CR tablet 40 mEq, 40 mEq, Oral, Daily, Cesario, Yu-Ping, MD, 40 mEq at 01/21/24 0909   tamsulosin  (FLOMAX ) capsule 0.4 mg, 0.4 mg, Oral, QODAY, Simmons, Brittainy M, PA-C, 0.4 mg at 01/21/24 9087   traZODone  (DESYREL ) tablet 50 mg, 50 mg, Oral, QHS PRN, Bensimhon, Daniel R, MD, 50 mg at 01/19/24 2205   warfarin (COUMADIN ) tablet 1 mg, 1 mg, Oral, ONCE-1600, Rolan Ezra RAMAN, MD   Warfarin - Pharmacist Dosing Inpatient, , Does not apply, q1600, Marcine Catalan HERO, PA-C, Given at 01/18/24 1646  Patients Current Diet:  Diet Order             Diet regular Room service appropriate? Yes; Fluid consistency: Thin; Fluid restriction: 2000 mL Fluid  Diet effective now                   Precautions / Restrictions Precautions Precautions: Fall Precaution/Restrictions Comments: watch O2 sats, HR, BP Restrictions Weight Bearing Restrictions Per Provider Order: No   Has the patient had 2 or more falls or a fall with injury in the past year? Yes  Prior Activity Level Community (5-7x/wk): driving, gets out of house daily  Prior Functional Level Self Care: Did the patient need help bathing, dressing, using the toilet or eating? Independent  Indoor Mobility: Did the patient need assistance with walking from room to room (with or without device)? Independent  Stairs: Did the patient need assistance with internal or external stairs (with or without device)? Independent  Functional Cognition: Did the patient need help planning regular  tasks such as shopping or remembering to take medications? Needed some help  Patient Information Are you of Hispanic, Latino/a,or Spanish origin?: A. No, not of Hispanic, Latino/a, or Spanish origin What is your race?: A. White Do you need or want an interpreter to communicate with a doctor or health care staff?: 0. No  Patient's Response To:  Health Literacy and Transportation Is the patient able to respond to health literacy and transportation needs?: Yes Health Literacy - How often do you need to  have someone help you when you read instructions, pamphlets, or other written material from your doctor or pharmacy?: Never In the past 12 months, has lack of transportation kept you from medical appointments or from getting medications?: No In the past 12 months, has lack of transportation kept you from meetings, work, or from getting things needed for daily living?: No  Home Assistive Devices / Equipment Home Equipment: Agricultural consultant (2 wheels), Toilet riser, Grab bars - toilet, Grab bars - tub/shower, Shower seat  Prior Device Use: Indicate devices/aids used by the patient prior to current illness, exacerbation or injury? cane  Current Functional Level Cognition  Orientation Level: Oriented X4    Extremity Assessment (includes Sensation/Coordination)  Upper Extremity Assessment: Generalized weakness  Lower Extremity Assessment: Generalized weakness    ADLs  Overall ADL's : Needs assistance/impaired Eating/Feeding: Set up, Sitting Grooming: Set up, Sitting Upper Body Bathing: Set up, Sitting Upper Body Bathing Details (indicate cue type and reason): A to wash back but otherwise set-up A Lower Body Bathing: Moderate assistance Lower Body Bathing Details (indicate cue type and reason): cues for figure 4 with LLE and then bending over for RLE Upper Body Dressing : Moderate assistance, Sitting Lower Body Dressing: Total assistance, Bed level Toilet Transfer: Maximal assistance (use of  stedy to transfer to chair) Toilet Transfer Details (indicate cue type and reason): simulated Functional mobility during ADLs: Minimal assistance (EVA) General ADL Comments: pt fatigued with transfer to chair    Mobility  Overal bed mobility: Needs Assistance Bed Mobility: Rolling, Sidelying to Sit Rolling: Min assist Sidelying to sit: Mod assist, HOB elevated Supine to sit: Mod assist, HOB elevated Sit to supine: Contact guard assist Sit to sidelying: Min assist, Used rails General bed mobility comments: Min assist to roll and sequence LEs off bed with mod assist for trunk support to rise to EOB. Cues for technique.    Transfers  Overall transfer level: Needs assistance Equipment used: Ambulation equipment used Transfers: Sit to/from Stand, Bed to chair/wheelchair/BSC Sit to Stand: Max assist, From elevated surface, Via lift equipment (stedy) Bed to/from chair/wheelchair/BSC transfer type:: Via Lift equipment Stand pivot transfers: +2 physical assistance, Mod assist Step pivot transfers: Max assist, +2 safety/equipment  Lateral/Scoot Transfers: Max Banker via Lift Equipment: [employer] transfer comment: Quite weak today. Bed elevated and Stedy utilized with max assist for boost to stand. Initially pushing posteriorly but with multimodal cues able to rise and stand in stedy. Stood from stedy paddles x2 with mod assist for boost. Transfer to chair with Stedy. Tolerated each stand <1 minute with cues for upright posture.    Ambulation / Gait / Stairs / Wheelchair Mobility  Ambulation/Gait Ambulation/Gait assistance: Min assist, +2 safety/equipment Gait Distance (Feet): 3 Feet Assistive device: 2 person hand held assist Gait Pattern/deviations: Step-to pattern, Decreased stride length, Shuffle General Gait Details: unable Gait velocity: decreased Gait velocity interpretation: <1.8 ft/sec, indicate of risk for recurrent falls Pre-gait activities: Standing in stedy,  progressed with lateral weight shifting, upright stance with cues for hip extension, and mini knee bends. Stairs:  (Pt demonstrates adequate strength from sit to stand and gait to perform stairs per home set up with minimal assist)    Posture / Balance Dynamic Sitting Balance Sitting balance - Comments: CGA EOB Balance Overall balance assessment: Needs assistance Sitting-balance support: Bilateral upper extremity supported, Feet supported Sitting balance-Leahy Scale: Poor Sitting balance - Comments: CGA EOB Standing balance support: Bilateral upper extremity supported, During functional activity, Reliant on assistive device  for balance Standing balance-Leahy Scale: Poor Standing balance comment: BIL UEs on stedy    Special needs/care consideration Continuous Drip IV  ***, Oxygen 2L nasal cannula, Skin Abrasion: arm, leg/right; Ecchymosis: arm/bilateral; Erythema/Redness: sacrum/bilateral; Pressure Injury- Coccys/left Stage 2; Wound: pretibial/proximal, right, and Diabetic management Novolog  0-15 units 3x daily with meals   Previous Home Environment (from acute therapy documentation) Living Arrangements: Spouse/significant other, Children  Lives With: Spouse, Son Available Help at Discharge: Family, Available 24 hours/day Type of Home: House Home Layout: One level Alternate Level Stairs-Rails: Left Alternate Level Stairs-Number of Steps: 10 goes to basement Home Access: Stairs to enter Entrance Stairs-Rails: Right, Left Entrance Stairs-Number of Steps: 2 Bathroom Shower/Tub: Psychologist, counselling, Engineer, manufacturing systems: Standard Bathroom Accessibility: Yes How Accessible: Accessible via walker Home Care Services: No  Discharge Living Setting Plans for Discharge Living Setting: Patient's home Type of Home at Discharge: House Discharge Home Layout: One level Discharge Home Access: Stairs to enter Entrance Stairs-Rails: Right, Left Entrance Stairs-Number of Steps: 2 Discharge  Bathroom Shower/Tub: Walk-in shower, Tub/shower unit Discharge Bathroom Toilet: Standard Discharge Bathroom Accessibility: Yes How Accessible: Accessible via walker Does the patient have any problems obtaining your medications?: No  Social/Family/Support Systems Anticipated Caregiver: [place] (son) and Consuelo (wife) Anticipated Caregiver's Contact Information: [place]: [phone]: [phone] Ability/Limitations of Caregiver: Consuelo can provide supervision, [place] can provide supervision and physical assistance Caregiver Availability: 24/7 Discharge Plan Discussed with Primary Caregiver: Yes Is Caregiver In Agreement with Plan?: Yes Does Caregiver/Family have Issues with Lodging/Transportation while Pt is in Rehab?: No  Goals Patient/Family Goal for Rehab: *** Expected length of stay: *** Pt/Family Agrees to Admission and willing to participate: Yes Program Orientation Provided & Reviewed with Pt/Caregiver Including Roles  & Responsibilities: Yes  Decrease burden of Care through IP rehab admission: NA  Possible need for SNF placement upon discharge: Not Anticipated  Patient Condition: {PATIENT'S CONDITION:22832}  Preadmission Screen Completed By:  Tinnie SHAUNNA Yvone Delayne, 01/21/2024 3:12 PM ______________________________________________________________________   Discussed status with Dr. PIERRETTE on *** at *** and received approval for admission today.  Admission Coordinator:  Tinnie SHAUNNA Yvone Delayne, CCC-SLP, time ***/Date ***   Assessment/Plan: Diagnosis: *** Does the need for close, 24 hr/day Medical supervision in concert with the patient's rehab needs make it unreasonable for this patient to be served in a less intensive setting? {yes_no_potentially:3041433} Co-Morbidities requiring supervision/potential complications: *** Due to {due un:6958565}, does the patient require 24 hr/day rehab nursing? {yes_no_potentially:3041433} Does the patient require coordinated care of a  physician, rehab nurse, PT, OT, and SLP to address physical and functional deficits in the context of the above medical diagnosis(es)? {yes_no_potentially:3041433} Addressing deficits in the following areas: {deficits:3041436} Can the patient actively participate in an intensive therapy program of at least 3 hrs of therapy 5 days a week? {yes_no_potentially:3041433} The potential for patient to make measurable gains while on inpatient rehab is {potential:3041437} Anticipated functional outcomes upon discharge from inpatient rehab: {functional outcomes:304600100} PT, {functional outcomes:304600100} OT, {functional outcomes:304600100} SLP Estimated rehab length of stay to reach the above functional goals is: *** Anticipated discharge destination: {anticipated dc setting:21604} 10. Overall Rehab/Functional Prognosis: {potential:3041437}   MD Signature: ***

## 2024-01-22 DIAGNOSIS — R570 Cardiogenic shock: Secondary | ICD-10-CM | POA: Diagnosis not present

## 2024-01-22 DIAGNOSIS — I5023 Acute on chronic systolic (congestive) heart failure: Secondary | ICD-10-CM | POA: Diagnosis not present

## 2024-01-22 LAB — BASIC METABOLIC PANEL WITH GFR
Anion gap: 12 (ref 5–15)
Anion gap: 14 (ref 5–15)
BUN: 89 mg/dL — ABNORMAL HIGH (ref 8–23)
BUN: 92 mg/dL — ABNORMAL HIGH (ref 8–23)
CO2: 37 mmol/L — ABNORMAL HIGH (ref 22–32)
CO2: 40 mmol/L — ABNORMAL HIGH (ref 22–32)
Calcium: 9.7 mg/dL (ref 8.9–10.3)
Calcium: 9.7 mg/dL (ref 8.9–10.3)
Chloride: 80 mmol/L — ABNORMAL LOW (ref 98–111)
Chloride: 81 mmol/L — ABNORMAL LOW (ref 98–111)
Creatinine, Ser: 2.44 mg/dL — ABNORMAL HIGH (ref 0.61–1.24)
Creatinine, Ser: 2.34 mg/dL — ABNORMAL HIGH (ref 0.61–1.24)
GFR, Estimated: 26 mL/min — ABNORMAL LOW (ref >60)
GFR, Estimated: 27 mL/min — ABNORMAL LOW (ref >60)
Glucose, Bld: 175 mg/dL — ABNORMAL HIGH (ref 70–99)
Glucose, Bld: 145 mg/dL — ABNORMAL HIGH (ref 70–99)
Potassium: 4.2 mmol/L (ref 3.5–5.1)
Potassium: 3.4 mmol/L — ABNORMAL LOW (ref 3.5–5.1)
Sodium: 129 mmol/L — ABNORMAL LOW (ref 135–145)
Sodium: 135 mmol/L (ref 135–145)

## 2024-01-22 LAB — CBC
HCT: 42.6 % (ref 39.0–52.0)
Hemoglobin: 13.4 g/dL (ref 13.0–17.0)
MCH: 19.9 pg — ABNORMAL LOW (ref 26.0–34.0)
MCHC: 31.5 g/dL (ref 30.0–36.0)
MCV: 63.3 fL — ABNORMAL LOW (ref 80.0–100.0)
Platelets: 160 10*3/uL (ref 150–400)
RBC: 6.73 MIL/uL — ABNORMAL HIGH (ref 4.22–5.81)
RDW: 19.5 % — ABNORMAL HIGH (ref 11.5–15.5)
WBC: 15.5 10*3/uL — ABNORMAL HIGH (ref 4.0–10.5)
nRBC: 1.8 % — ABNORMAL HIGH (ref 0.0–0.2)

## 2024-01-22 LAB — PROTIME-INR
INR: 3.4 — ABNORMAL HIGH (ref 0.8–1.2)
Prothrombin Time: 35.8 s — ABNORMAL HIGH (ref 11.4–15.2)

## 2024-01-22 LAB — GLUCOSE, CAPILLARY
Glucose-Capillary: 176 mg/dL — ABNORMAL HIGH (ref 70–99)
Glucose-Capillary: 202 mg/dL — ABNORMAL HIGH (ref 70–99)
Glucose-Capillary: 215 mg/dL — ABNORMAL HIGH (ref 70–99)
Glucose-Capillary: 126 mg/dL — ABNORMAL HIGH (ref 70–99)

## 2024-01-22 LAB — MAGNESIUM: Magnesium: 2.8 mg/dL — ABNORMAL HIGH (ref 1.7–2.4)

## 2024-01-22 LAB — COOXEMETRY PANEL
Carboxyhemoglobin: 2.1 % — ABNORMAL HIGH (ref 0.5–1.5)
Methemoglobin: 0.9 % (ref 0.0–1.5)
O2 Saturation: 60.6 %
Total hemoglobin: 14 g/dL (ref 12.0–16.0)

## 2024-01-22 MED ORDER — POTASSIUM CHLORIDE CRYS ER 20 MEQ PO TBCR
40.0000 meq | EXTENDED_RELEASE_TABLET | ORAL | Status: AC
  Administered 2024-01-22 (×3): 40 meq via ORAL
  Filled 2024-01-22 (×3): qty 2

## 2024-01-22 MED ORDER — POTASSIUM CHLORIDE CRYS ER 20 MEQ PO TBCR: 40.0000 meq | EXTENDED_RELEASE_TABLET | Freq: Once | ORAL | Status: DC

## 2024-01-22 MED ORDER — METOLAZONE 5 MG PO TABS
5.0000 mg | ORAL_TABLET | Freq: Once | ORAL | Status: AC
  Administered 2024-01-22: 5 mg via ORAL
  Filled 2024-01-22: qty 1

## 2024-01-22 MED ORDER — SODIUM CHLORIDE 0.9 % IV SOLN
INTRAVENOUS | Status: DC
Start: 2024-01-23 — End: 2024-01-24

## 2024-01-22 MED ORDER — SODIUM CHLORIDE 3 % IV BOLUS
150.0000 mL | Freq: Two times a day (BID) | INTRAVENOUS | Status: AC
  Administered 2024-01-22 (×2): 150 mL via INTRAVENOUS
  Filled 2024-01-22 (×2): qty 500

## 2024-01-22 NOTE — Progress Notes (Addendum)
 ANTICOAGULATION CONSULT NOTE  Pharmacy Consult for Warfarin Indication: afib with mechanical AVR  Allergies  Allergen Reactions   Celecoxib Rash    Other Reaction(s): Unknown    Patient Measurements: Height: 5' 10 (177.8 cm) Weight: 84.5 kg (186 lb 4.6 oz) IBW/kg (Calculated) : 73  Vital Signs: Temp: 98.5 F (36.9 C) (07/01 0400) Temp Source: Axillary (07/01 0400) BP: 91/66 (07/01 0400) Pulse Rate: 89 (07/01 0400)  Labs: Recent Labs    01/20/24 0521 01/20/24 1510 01/21/24 0453 01/22/24 0358  HGB 13.1  --  13.4 13.4  HCT 41.3  --  42.1 42.6  PLT 168  --  155 160  LABPROT 32.0*  --  33.9* 35.8*  INR 2.9*  --  3.2* 3.4*  CREATININE 1.97* 2.00* 2.18* 2.44*    Estimated Creatinine Clearance: 24.1 mL/min (A) (by C-G formula based on SCr of 2.44 mg/dL (H)).   Medical History: Past Medical History:  Diagnosis Date   Anticoagulant long-term use    Arthritis    probably in my thumbs (06/26/2012)   Bifascicular block    Chronic combined systolic and diastolic CHF (congestive heart failure) (HCC)    a. 01/2016 Echo: EF 45-50%, gr2 DD, inflat AK (poor acoustic windows);  b. 07/2016 Echo: EF 25-30%, mild LVH, PASP (pt in Afib).   Complication of anesthesia    wake up w/a start; hallucinations (06/26/2012)   Critical Aortic Stenosis    a. 03/2003 s/p SJM #25 mech prosthetic AoV;  b. 07/2016 Echo: EF 25-30% (in setting of AF), AoV area 1.72 cm^2 (VTI), 1.75 cm^2 (Vmean).   Diverticulosis    Gouty arthritis    have had it in both feet, ankles, right knee (06/26/2012)   History of diverticulitis of colon    History of kidney stones    History of pancreatitis    Hypertension    Incomplete RBBB    PAF (paroxysmal atrial fibrillation) (HCC)    a. CHA2DS2VASc = 4-->chronic coumadin  in setting of mech AoV;  b. 07/2016 Recurrent AF RVR.    Medications:  Medications Prior to Admission  Medication Sig Dispense Refill Last Dose/Taking   acetaminophen  (TYLENOL ) 500 MG  tablet Take 500 mg by mouth 2 (two) times daily.   01/01/2024   albuterol  (VENTOLIN  HFA) 108 (90 Base) MCG/ACT inhaler Inhale 2 puffs into the lungs every 4 (four) hours.   01/01/2024   allopurinol  (ZYLOPRIM ) 300 MG tablet Take 150 mg by mouth daily.   01/01/2024   aspirin  EC 81 MG tablet Take 81 mg by mouth daily. Swallow whole.   01/01/2024   clobetasol cream (TEMOVATE) 0.05 % Apply 1 Application topically 2 (two) times daily.   01/01/2024   colchicine  0.6 MG tablet Take 1 tablet (0.6 mg total) by mouth daily as needed (for gout).   Unknown   FARXIGA  10 MG TABS tablet TAKE 1 TABLET BY MOUTH EVERY DAY 30 tablet 6 01/01/2024   FIBER PO Take 2 capsules by mouth 2 (two) times daily.   01/01/2024   folic acid  (FOLVITE ) 1 MG tablet TAKE 1 TABLET(1 MG) BY MOUTH DAILY 90 tablet 3 01/01/2024   metoprolol  succinate (TOPROL -XL) 25 MG 24 hr tablet TAKE 1 TABLET BY MOUTH EVERYDAY AT BEDTIME 90 tablet 1 12/31/2023   potassium chloride  SA (KLOR-CON  M) 20 MEQ tablet Take 1 tablet (20 mEq total) by mouth daily.   01/01/2024   tamsulosin  (FLOMAX ) 0.4 MG CAPS capsule Take 1 capsule (0.4 mg total) by mouth every other day. 90  capsule 3 01/01/2024   tolterodine  (DETROL  LA) 4 MG 24 hr capsule Take 1 capsule (4 mg total) by mouth every other day. Alternating days with the tamsulosin  90 capsule 3 12/31/2023   torsemide  (DEMADEX ) 20 MG tablet Take 3 tablets (60 mg total) by mouth daily.   01/01/2024   warfarin (COUMADIN ) 5 MG tablet Take 0.5-1 tablets (2.5-5 mg total) by mouth See admin instructions. 1/19 and 1/20  take 7.5mg  (1 and 1/2 tab) then take 1 tab (5mg ) daily until seen by MD  Take 1 tablet (5 mg) by mouth on Sundays, Mondays, Wednesdays, Thursdays & Saturdays in the evening. Take 0.5 tablet (2.5 mg) by mouth on Tuesdays & Fridays in the evening. (Patient taking differently: Take 2.5-5 mg by mouth See admin instructions. AS directed by PCP 2.5 mg Daily) 45 tablet 11 12/31/2023 at  8:00 PM   docusate sodium  (COLACE) 100 MG  capsule Take 100 mg by mouth 2 (two) times daily. (Patient not taking: Reported on 01/01/2024)   Not Taking   polyethylene glycol powder (GLYCOLAX /MIRALAX ) 17 GM/SCOOP powder Take 17 g by mouth 2 (two) times daily as needed for moderate constipation or mild constipation. (Patient not taking: Reported on 12/26/2023) 238 g 2    Scheduled:   acetaZOLAMIDE   500 mg Oral BID   allopurinol   150 mg Oral Daily   amiodarone   400 mg Oral Daily   aspirin  EC  81 mg Oral Daily   Chlorhexidine  Gluconate Cloth  6 each Topical Daily   feeding supplement  237 mL Oral BID BM   fesoterodine   4 mg Oral QODAY   insulin  aspart  0-15 Units Subcutaneous TID WC   metolazone   5 mg Oral Once   midodrine  15 mg Oral Q8H   potassium chloride   40 mEq Oral Daily   tamsulosin   0.4 mg Oral QODAY   Warfarin - Pharmacist Dosing Inpatient   Does not apply q1600   Infusions:   furosemide  (LASIX ) 200 mg in dextrose  5 % 100 mL (2 mg/mL) infusion 30 mg/hr (01/22/24 0700)   milrinone  0.125 mcg/kg/min (01/22/24 0726)   sodium chloride  3% (hypertonic)     PRN: ondansetron  (ZOFRAN ) IV, mouth rinse, traZODone   Assessment: 83 yo male presenting with worsening shortness of breath. On warfarin PTA for afib with mechanical AVR.    Per patient, dose is 5 mg daily, poor historian. Upon chart review patient should be on 2.5 mg once daily on Monday, Tuesday, Thursday, Friday, and Sunday and 1 mg on Wednesday and Saturday per last PCP note on 12/18/2023. Goal INR is 2.5-3.5.   Pertinent DDI's - s/p amio IV (none PTA, 6/12 >20), PO amio (200 mg 6/20 >6/23, 400 mg 6/23 >c), allopurinol  (PTA).   INR 3.4 therapeutic but continues to trend up. CBC stable (Hgb 13.4, PLT 160).  No bleeding noted.  Noted amiodarone  was increased from 200 mg to 400 mg daily on 6/23.  Per RN, reduced PO intake over the past few days (~25% meals) also receiving nutritional supplements (Ensure BID).  Planning RHC tomorrow - will hold tonight's dose after discussion with  HF MD.   Goal of Therapy:  INR 2.5-3.5 Monitor platelets by anticoagulation protocol: Yes   Plan:  HOLD warfarin Monitor for s/sx of bleeding, daily INR, CBC Watch for new DDIs and changes in appetite  Maurilio Fila, PharmD Clinical Pharmacist 01/22/2024  10:26 AM

## 2024-01-22 NOTE — TOC Progression Note (Signed)
 Transition of Care [facility]) - Progression Note    Patient Details  Name: MAKENZIE VITTORIO MRN: [id number] Date of Birth: 04-Jan-1941  Transition of Care [facility]) CM/SW Contact  Justina Delcia Czar, RN Phone Number: [phone] 01/22/2024, 9:48 AM  Clinical Narrative:    TOC CM spoke to pt at bedside. Reviewed dc readiness. Pt hopeful for IP rehab and getting better. Prefers to go home but agreeable to IP rehab. Wife and dtr assist with decision making.  CIR following for IP rehab and insurance auth closer to dc.     Expected Discharge Plan: IP Rehab Facility Barriers to Discharge: Continued Medical Work up  Expected Discharge Plan and Services   Discharge Planning Services: CM Consult Post Acute Care Choice: IP Rehab Living arrangements for the past 2 months: Single Family Home                                       Social Determinants of Health (SDOH) Interventions SDOH Screenings   Food Insecurity: No Food Insecurity (01/02/2024)  Housing: Low Risk  (01/02/2024)  Transportation Needs: No Transportation Needs (01/02/2024)  Utilities: Not At Risk (01/02/2024)  Social Connections: Moderately Isolated (01/02/2024)  Tobacco Use: Low Risk  (01/02/2024)    Readmission Risk Interventions     No data to display

## 2024-01-22 NOTE — H&P (View-Only) (Signed)
 Advanced Heart Failure Rounding Note  AHF Cardiologist: Rolan Fuel, MD Chief Complaint: Cardiogenic Shock Subjective:   Moved to ICU on 6/11 for cardiogenic shock.   6/21  lasix  drip increased to 60 mg per hour and  Hypertonic Saline added.  6/25 Hypertonic saline stopped. Given 15 mg  tolvaptan .   - Poor response during the day to IV lasix  bolus + diamox  +metolazone . Lasix  gtt restarted at 30mg /hr. -1.9L UOP. Weight unchanged,  - CVP 12; sCr slightly higher.   - On milrinone  0.125. co-ox 61% - MAP 70s-80s with SBP 90s-100s.   Objective:    Vital Signs:   Temp:  [96.5 F (35.8 C)-98.5 F (36.9 C)] 98.5 F (36.9 C) (07/01 0400) Pulse Rate:  [76-262] 89 (07/01 0400) Resp:  [12-34] 18 (07/01 0400) BP: (77-165)/(56-153) 91/66 (07/01 0400) SpO2:  [85 %-100 %] 95 % (07/01 0400) Weight:  [84.5 kg] 84.5 kg (07/01 0415) Last BM Date : 01/18/24  Weight change: Filed Weights   01/20/24 0500 01/21/24 0600 01/22/24 0415  Weight: 84 kg 84.4 kg 84.5 kg   Intake/Output:  Intake/Output Summary (Last 24 hours) at 01/22/2024 0704 Last data filed at 01/22/2024 0700 Gross per 24 hour  Intake 629.02 ml  Output 1900 ml  Net -1270.98 ml  CVP 9 General:  elderly / frail appearing.  No respiratory difficulty Neck: supple. JVD ~9 cm. LIJ CVC Cor: PMI nondisplaced. Regular rate & rhythm. No rubs, gallops or murmurs. Lungs: clear Extremities: no cyanosis, clubbing, rash, +1-2 BLE edema  Neuro: alert & oriented x 3. Affect flat.   Labs: Basic Metabolic Panel: Recent Labs  Lab 01/15/24 1200 01/16/24 0304 01/16/24 1133 01/16/24 1409 01/16/24 2146 01/17/24 0130 01/17/24 1715 01/18/24 0103 01/18/24 0423 01/18/24 1400 01/19/24 0453 01/20/24 0521 01/20/24 1510 01/21/24 0453 01/22/24 0358  NA 126*   < > 126*   < > 127*   < > 127*   < > 126* 127* 127* 129* 129* 131* 129*  K 4.3   < > 4.2  --  4.3   < > 4.8  --  3.1* 4.1 3.9 3.0* 3.4* 3.8 4.2  CL 76*   < > 75*  --  72*   < > 74*  --   72* 76* 75* 74* 75* 79* 80*  CO2 35*   < > 39*  --  38*   < > 38*  --  39* 40* 37* 43* 37* 39* 37*  GLUCOSE 251*   < > 179*  --  149*   < > 93  --  138* 110* 153* 137* 141* 161* 175*  BUN 63*   < > 61*  --  66*   < > 67*  --  66* 66* 73* 76* 77* 79* 89*  CREATININE 1.74*   < > 1.67*  --  1.64*   < > 1.72*  --  1.68* 1.77* 1.88* 1.97* 2.00* 2.18* 2.44*  CALCIUM 9.1   < > 8.9  --  9.5   < > 9.3  --  9.3 9.3 9.6 9.3 9.3 9.7 9.7  MG  --   --   --   --   --    < >  --   --  2.5*  --  2.7* 2.6*  --  2.7* 2.8*  PHOS 3.0  --  2.4*  --  3.0  --  3.3  --   --  3.2  --   --   --   --   --    < > =  values in this interval not displayed.   Liver Function Tests: Recent Labs  Lab 01/16/24 1133 01/16/24 2146 01/17/24 0928 01/17/24 1715 01/18/24 1400  AST  --   --  28  --   --   ALT  --   --  20  --   --   ALKPHOS  --   --  113  --   --   BILITOT  --   --  3.0*  --   --   PROT  --   --  5.7*  --   --   ALBUMIN 3.1* 3.3* 3.2* 3.2* 3.1*    CBC: Recent Labs  Lab 01/18/24 0423 01/19/24 0453 01/20/24 0521 01/21/24 0453 01/22/24 0358  WBC 15.6* 17.3* 15.0* 14.2* 15.5*  HGB 13.3 13.9 13.1 13.4 13.4  HCT 41.6 43.2 41.3 42.1 42.6  MCV 62.2* 63.2* 63.3* 63.8* 63.3*  PLT 191 210 168 155 160   Medications:    Scheduled Medications:  acetaZOLAMIDE   500 mg Oral BID   allopurinol   150 mg Oral Daily   amiodarone   400 mg Oral Daily   aspirin  EC  81 mg Oral Daily   Chlorhexidine  Gluconate Cloth  6 each Topical Daily   feeding supplement  237 mL Oral BID BM   fesoterodine   4 mg Oral QODAY   insulin  aspart  0-15 Units Subcutaneous TID WC   midodrine  15 mg Oral Q8H   potassium chloride   40 mEq Oral Daily   tamsulosin   0.4 mg Oral QODAY   Warfarin - Pharmacist Dosing Inpatient   Does not apply q1600   Infusions:  furosemide  (LASIX ) 200 mg in dextrose  5 % 100 mL (2 mg/mL) infusion 30 mg/hr (01/22/24 0700)   milrinone  0.125 mcg/kg/min (01/22/24 0700)   PRN Medications: ondansetron  (ZOFRAN ) IV,  mouth rinse, traZODone   Assessment/Plan:  1. Acute on chronic systolic CHF -> cardiogenic shock:  - Coronary angiography in 1/23  mild CAD.  - Echo 01/03/24  EF 25-30% RV mod reduced - Severe diuretic resistance; previously diuresed with lasix  60mg /hr with hypertonic saline..  - Continue lasix  gtt @30  mg /hr + diamox  500 mg BID. Add metolazone  5 mg again today.  - Add hypertonic saline today 150 mL BID. Follow response. BMET in afternoon.  - Continue midodrine to 15mg  TID, wean as tolerated. SBP 90s-100s - Hold SGLT2i while he is with purwick and minimally active. Can restart when more mobile.  - Stable off pressors ; mixed venous 61%. Continue milrinone  with elevated CVP and increased renal function.  - Place TED hose  2. Mechanical aortic valve: Continue warfarin and ASA 81. - INR 3.4, discussed with pharmD.   3. Paroxysmal Atrial fibrillation/atrial flutter:  - Admitted 1/18 with atrial fibrillation with RVR and CHF.  EF down to 20-25%.  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   - In cardiogenic shock in setting of recurrent AF.  - Failed DC-CV on 6/13 - Maintaining SR.  Now in NSR, off amiodarone  gtt - Continue amiodarone  400 mg daily - Continue coumadin .  - He will need follow up with EP after d/c   4. Ascending aortic aneurysm: 4.3 cm on 4/24 MRA chest.   5. AKI on CKD2: Creatinine 2.14 on admit. Baseline 1.5. Suspect cardio-renal.  - 2.4 today - see above   6. Hyperkalemia: resolved.   7. Shock liver - likely 2/2 low output/hepatic congestion   8. Hyponatremia - limit FW, diuresis - Stable; hold tolvaptan .  9. ILD - CT reviewed with P/CCM initially  10. Hypokalemia  - stable  Mobilize! OOB.   Length of Stay: 21   Beckey LITTIE Coe, NP 01/22/2024, 7:04 AM  Advanced Heart Failure Team Pager [phone] (M-F; 7a - 4p). For after hours, see Amion for on call HF MD  CRITICAL CARE Performed by: Beckey LITTIE Coe  Total critical care time: 13  minutes  Critical care time was exclusive of separately billable procedures and treating other patients.  Critical care was necessary to treat or prevent imminent or life-threatening deterioration.  Critical care was time spent personally by me on the following activities: development of treatment plan with patient and/or surrogate as well as nursing, discussions with consultants, evaluation of patient's response to treatment, examination of patient, obtaining history from patient or surrogate, ordering and performing treatments and interventions, ordering and review of laboratory studies, ordering and review of radiographic studies, pulse oximetry and re-evaluation of patient's condition.  Patient seen with NP, I formulated the plan and agree with the above note.   Some diuresis yesterday on Lasix  gtt 30 mg/hr with metolazone  and Diamox , but not marked.  I/Os net negative 1271 with no change in weight.  CVP 12.  Mildly tachypneic.  Co-ox 61% on milrinone  0.125, he is also on midodrine 15 mg tid with SBP 90s.   Has been up to chair.  More awake today.   General: NAD Neck: JVP 14+ cm, no thyromegaly or thyroid  nodule.  Lungs: Decreased at bases.  CV: Nondisplaced PMI.  Heart regular S1/S2, mechanical S2, 1/6 SEM RUSB.  1+ edema to knees.   Abdomen: Soft, nontender, no hepatosplenomegaly, no distention.  Skin: Intact without lesions or rashes.  Neurologic: Alert and oriented x 3.  Psych: Normal affect. Extremities: No clubbing or cyanosis.  HEENT: Normal.   CVP 12 on my read this morning but he remains edematous and JVP looks even higher.  Diuresis poor yesterday, weight stable.  Creatinine mildly higher at 2.18 => 2.44, Na low at 129. Diuretic resistance.  - Will continue Lasix  gtt 30 mg/hr with metolazone  5 mg x 1 and acetazolamide  500 bid.  - Will give 3% saline 150 cc over 30 minutes bid x 2 doses today.  Will check BMET in between doses.  - RHC tomorrow to assess full hemodynamics.   Will need to use brachial access if possible given elevated INR. Discussed risks/benefits with wife and patient, they agree to procedure.  - Continue milrinone  0.125 with adequate co-ox 61%.  - Wean midodrine as able, will not change today with SBP 90s.   CRITICAL CARE Performed by: Ezra Shuck  Total critical care time: 40 minutes  Critical care time was exclusive of separately billable procedures and treating other patients.  Critical care was necessary to treat or prevent imminent or life-threatening deterioration.  Critical care was time spent personally by me on the following activities: development of treatment plan with patient and/or surrogate as well as nursing, discussions with consultants, evaluation of patient's response to treatment, examination of patient, obtaining history from patient or surrogate, ordering and performing treatments and interventions, ordering and review of laboratory studies, ordering and review of radiographic studies, pulse oximetry and re-evaluation of patient's condition.  Ezra Shuck 01/22/2024 10:30 AM

## 2024-01-22 NOTE — Progress Notes (Signed)
 Occupational Therapy Treatment Patient Details Name: Adrian Lambert MRN: [id number] DOB: 22-Oct-1940 Today's Date: 01/22/2024   History of present illness Pt is 83 yo presenting to [facility] ED on 6/10 due to shortness of breath Developed cardiogenic shock, progressive hypotension and signs of hyperperfusion. Admitted to ICU for inotropes. PMH: arthritis, systolic and diastolic CHF, diverticulosis, HTN, incomplete RBBB, PAF   OT comments  Pt demonstrates decreased activity tolerance this session. RN reports pt sleeping majority of the day. Pt noted to have bleeding from R nostril after bed mobility for hygiene. Pt with bed placed on normal due to break down noted at scrotum. Recommendations and goals downgraded to skilled inpatient follow up therapy, <3 hours/day.  Pt limited bed level participation this session due to fatigue.       If plan is discharge home, recommend the following:      Equipment Recommendations  BSC/3in1;Wheelchair cushion (measurements OT);Wheelchair (measurements OT);Hoyer lift;Hospital bed    Recommendations for Other Services      Precautions / Restrictions Precautions Precautions: Fall Precaution/Restrictions Comments: watch 02, HR,       Mobility Bed Mobility Overal bed mobility: Needs Assistance Bed Mobility: Rolling Rolling: Total assist, +2 for physical assistance, +2 for safety/equipment         General bed mobility comments: pt rolling to the R x3 for peri care and L for bed pad placement 1. pt noted to reach with L UE for bed rail to help once initiated. pt adjusted to chair position with decr map with ted hose and SCD placed on. pt with legs returned to level bed position. pt iwth bleeding from R nostril.    Transfers                   General transfer comment: using the bed for upright posture due to fatigue and ability to (A). pt will need geo mat and maxisky to progess to chair at this time     Balance Overall balance assessment:  Needs assistance   Sitting balance-Leahy Scale: Poor                                     ADL either performed or assessed with clinical judgement   ADL Overall ADL's : Needs assistance/impaired Eating/Feeding: Moderate assistance Eating/Feeding Details (indicate cue type and reason): lunch present and no awareness due to sleeping Grooming: Wash/dry face;Maximal assistance;Bed level Grooming Details (indicate cue type and reason): requires OT to help hold R UE to complete task.                               General ADL Comments: incontinence of bowel and no awareness. pt iwth lines from bed linen and bed noted to be max inflate. bed adjusted to normal. pt with scrotal wound and drainage on pad.    Extremity/Trunk Assessment Upper Extremity Assessment Upper Extremity Assessment: Generalized weakness;RUE deficits/detail;LUE deficits/detail RUE Deficits / Details: pitting edema notes decreased shoulder flexion to less than 90 degrees, edema in the hand. able to initiate movement with weakness noted LUE Deficits / Details: edema but able to raise LUE and scratch L ear . denies pain at ear   Lower Extremity Assessment Lower Extremity Assessment: Generalized weakness        Vision       Perception     Praxis  Communication Communication Communication: Impaired Factors Affecting Communication: Reduced clarity of speech   Cognition Arousal: Lethargic Behavior During Therapy: Flat affect Cognition: Difficult to assess             OT - Cognition Comments: slow processing and fatigued closely eyes at times                 Following commands: Intact        Cueing   Cueing Techniques: Verbal cues, Tactile cues, Visual cues  Exercises Exercises: Other exercises General Exercises - Lower Extremity Ankle Circles/Pumps: AAROM, Strengthening, Both, 10 reps Other Exercises Other Exercises: shoulder flexion, elbow flexion extension hand  flexion and extension x10 reps each side with (A) for shoulder and elbow. Other Exercises: RN notified pt reports eyes are dry    Shoulder Instructions       General Comments HR 85 BP 88/62 (70)  prior to movement and bed moved to chair position 85/54 map 66 . pt with bed returned to hob elevation only as pt falling back to sleep.    Pertinent Vitals/ Pain       Pain Assessment Pain Assessment: No/denies pain  Home Living                                          Prior Functioning/Environment              Frequency  Min 2X/week        Progress Toward Goals  OT Goals(current goals can now be found in the care plan section)  Progress towards OT goals: Not progressing toward goals - comment  Acute Rehab OT Goals OT Goal Formulation: With patient Time For Goal Achievement: 02/05/24 Potential to Achieve Goals: Fair ADL Goals Pt Will Perform Grooming: with min assist Pt Will Perform Lower Body Bathing: with min assist Pt Will Perform Lower Body Dressing: with max assist Pt Will Transfer to Toilet: with +2 assist;with max assist;squat pivot transfer;bedside commode Pt Will Perform Toileting - Clothing Manipulation and hygiene:  (na) Additional ADL Goal #1: Pt will generalize energy conservation strategies in ADLs and mobility.  Plan      Co-evaluation                 AM-PAC OT 6 Clicks Daily Activity     Outcome Measure   Help from another person eating meals?: A Lot Help from another person taking care of personal grooming?: A Lot Help from another person toileting, which includes using toliet, bedpan, or urinal?: Total Help from another person bathing (including washing, rinsing, drying)?: Total Help from another person to put on and taking off regular upper body clothing?: A Lot Help from another person to put on and taking off regular lower body clothing?: Total 6 Click Score: 9    End of Session Equipment Utilized During  Treatment: Oxygen  OT Visit Diagnosis: Unsteadiness on feet (R26.81)   Activity Tolerance Patient limited by fatigue   Patient Left in bed;with call bell/phone within reach;with nursing/sitter in room;with SCD's reapplied   Nurse Communication Mobility status;Precautions;Need for lift equipment        Time: 8696-8671 OT Time Calculation (min): 25 min  Charges: OT General Charges $OT Visit: 1 Visit OT Treatments $Self Care/Home Management : 23-37 mins   Brynn, OTR/L  Acute Rehabilitation Services Office: [phone] .   Ely Molt 01/22/2024, 2:41 PM

## 2024-01-22 NOTE — Progress Notes (Addendum)
 Advanced Heart Failure Rounding Note  [facility] Cardiologist: Rolan Fuel, MD Chief Complaint: Cardiogenic Shock Subjective:   Moved to ICU on 6/11 for cardiogenic shock.   6/21  lasix  drip increased to 60 mg per hour and  Hypertonic Saline added.  6/25 Hypertonic saline stopped. Given 15 mg  tolvaptan .   - Poor response during the day to IV lasix  bolus + diamox  +metolazone . Lasix  gtt restarted at 30mg /hr. -1.9L UOP. Weight unchanged,  - CVP 12; sCr slightly higher.   - On milrinone  0.125. co-ox 61% - MAP 70s-80s with SBP 90s-100s.   Objective:    Vital Signs:   Temp:  [96.5 F (35.8 C)-98.5 F (36.9 C)] 98.5 F (36.9 C) (07/01 0400) Pulse Rate:  [76-262] 89 (07/01 0400) Resp:  [12-34] 18 (07/01 0400) BP: (77-165)/(56-153) 91/66 (07/01 0400) SpO2:  [85 %-100 %] 95 % (07/01 0400) Weight:  [84.5 kg] 84.5 kg (07/01 0415) Last BM Date : 01/18/24  Weight change: Filed Weights   01/20/24 0500 01/21/24 0600 01/22/24 0415  Weight: 84 kg 84.4 kg 84.5 kg   Intake/Output:  Intake/Output Summary (Last 24 hours) at 01/22/2024 0704 Last data filed at 01/22/2024 0700 Gross per 24 hour  Intake 629.02 ml  Output 1900 ml  Net -1270.98 ml  CVP 9 General:  elderly / frail appearing.  No respiratory difficulty Neck: supple. JVD ~9 cm. LIJ CVC Cor: PMI nondisplaced. Regular rate & rhythm. No rubs, gallops or murmurs. Lungs: clear Extremities: no cyanosis, clubbing, rash, +1-2 BLE edema  Neuro: alert & oriented x 3. Affect flat.   Labs: Basic Metabolic Panel: Recent Labs  Lab 01/15/24 1200 01/16/24 0304 01/16/24 1133 01/16/24 1409 01/16/24 2146 01/17/24 0130 01/17/24 1715 01/18/24 0103 01/18/24 0423 01/18/24 1400 01/19/24 0453 01/20/24 0521 01/20/24 1510 01/21/24 0453 01/22/24 0358  NA 126*   < > 126*   < > 127*   < > 127*   < > 126* 127* 127* 129* 129* 131* 129*  K 4.3   < > 4.2  --  4.3   < > 4.8  --  3.1* 4.1 3.9 3.0* 3.4* 3.8 4.2  CL 76*   < > 75*  --  72*   < > 74*  --   72* 76* 75* 74* 75* 79* 80*  CO2 35*   < > 39*  --  38*   < > 38*  --  39* 40* 37* 43* 37* 39* 37*  GLUCOSE 251*   < > 179*  --  149*   < > 93  --  138* 110* 153* 137* 141* 161* 175*  BUN 63*   < > 61*  --  66*   < > 67*  --  66* 66* 73* 76* 77* 79* 89*  CREATININE 1.74*   < > 1.67*  --  1.64*   < > 1.72*  --  1.68* 1.77* 1.88* 1.97* 2.00* 2.18* 2.44*  CALCIUM 9.1   < > 8.9  --  9.5   < > 9.3  --  9.3 9.3 9.6 9.3 9.3 9.7 9.7  MG  --   --   --   --   --    < >  --   --  2.5*  --  2.7* 2.6*  --  2.7* 2.8*  PHOS 3.0  --  2.4*  --  3.0  --  3.3  --   --  3.2  --   --   --   --   --    < > =  values in this interval not displayed.   Liver Function Tests: Recent Labs  Lab 01/16/24 1133 01/16/24 2146 01/17/24 0928 01/17/24 1715 01/18/24 1400  AST  --   --  28  --   --   ALT  --   --  20  --   --   ALKPHOS  --   --  113  --   --   BILITOT  --   --  3.0*  --   --   PROT  --   --  5.7*  --   --   ALBUMIN 3.1* 3.3* 3.2* 3.2* 3.1*    CBC: Recent Labs  Lab 01/18/24 0423 01/19/24 0453 01/20/24 0521 01/21/24 0453 01/22/24 0358  WBC 15.6* 17.3* 15.0* 14.2* 15.5*  HGB 13.3 13.9 13.1 13.4 13.4  HCT 41.6 43.2 41.3 42.1 42.6  MCV 62.2* 63.2* 63.3* 63.8* 63.3*  PLT 191 210 168 155 160   Medications:    Scheduled Medications:  acetaZOLAMIDE   500 mg Oral BID   allopurinol   150 mg Oral Daily   amiodarone   400 mg Oral Daily   aspirin  EC  81 mg Oral Daily   Chlorhexidine  Gluconate Cloth  6 each Topical Daily   feeding supplement  237 mL Oral BID BM   fesoterodine   4 mg Oral QODAY   insulin  aspart  0-15 Units Subcutaneous TID WC   midodrine  15 mg Oral Q8H   potassium chloride   40 mEq Oral Daily   tamsulosin   0.4 mg Oral QODAY   Warfarin - Pharmacist Dosing Inpatient   Does not apply q1600   Infusions:  furosemide  (LASIX ) 200 mg in dextrose  5 % 100 mL (2 mg/mL) infusion 30 mg/hr (01/22/24 0700)   milrinone  0.125 mcg/kg/min (01/22/24 0700)   PRN Medications: ondansetron  (ZOFRAN ) IV,  mouth rinse, traZODone   Assessment/Plan:  1. Acute on chronic systolic CHF -> cardiogenic shock:  - Coronary angiography in 1/23  mild CAD.  - Echo 01/03/24  EF 25-30% RV mod reduced - Severe diuretic resistance; previously diuresed with lasix  60mg /hr with hypertonic saline..  - Continue lasix  gtt @30  mg /hr + diamox  500 mg BID. Add metolazone  5 mg again today.  - Add hypertonic saline today 150 mL BID. Follow response. BMET in afternoon.  - Continue midodrine to 15mg  TID, wean as tolerated. SBP 90s-100s - Hold SGLT2i while he is with purwick and minimally active. Can restart when more mobile.  - Stable off pressors ; mixed venous 61%. Continue milrinone  with elevated CVP and increased renal function.  - Place TED hose  2. Mechanical aortic valve: Continue warfarin and ASA 81. - INR 3.4, discussed with pharmD.   3. Paroxysmal Atrial fibrillation/atrial flutter:  - Admitted 1/18 with atrial fibrillation with RVR and CHF.  EF down to 20-25%.  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   - In cardiogenic shock in setting of recurrent AF.  - Failed DC-CV on 6/13 - Maintaining SR.  Now in NSR, off amiodarone  gtt - Continue amiodarone  400 mg daily - Continue coumadin .  - He will need follow up with EP after d/c   4. Ascending aortic aneurysm: 4.3 cm on 4/24 MRA chest.   5. AKI on CKD2: Creatinine 2.14 on admit. Baseline 1.5. Suspect cardio-renal.  - 2.4 today - see above   6. Hyperkalemia: resolved.   7. Shock liver - likely 2/2 low output/hepatic congestion   8. Hyponatremia - limit FW, diuresis - Stable; hold tolvaptan .  9. ILD - CT reviewed with P/CCM initially  10. Hypokalemia  - stable  Mobilize! OOB.   Length of Stay: 21   Beckey LITTIE Coe, NP 01/22/2024, 7:04 AM  Advanced Heart Failure Team Pager [phone] (M-F; 7a - 4p). For after hours, see Amion for on call HF MD  CRITICAL CARE Performed by: Beckey LITTIE Coe  Total critical care time: 13  minutes  Critical care time was exclusive of separately billable procedures and treating other patients.  Critical care was necessary to treat or prevent imminent or life-threatening deterioration.  Critical care was time spent personally by me on the following activities: development of treatment plan with patient and/or surrogate as well as nursing, discussions with consultants, evaluation of patient's response to treatment, examination of patient, obtaining history from patient or surrogate, ordering and performing treatments and interventions, ordering and review of laboratory studies, ordering and review of radiographic studies, pulse oximetry and re-evaluation of patient's condition.  Patient seen with NP, I formulated the plan and agree with the above note.   Some diuresis yesterday on Lasix  gtt 30 mg/hr with metolazone  and Diamox , but not marked.  I/Os net negative 1271 with no change in weight.  CVP 12.  Mildly tachypneic.  Co-ox 61% on milrinone  0.125, he is also on midodrine 15 mg tid with SBP 90s.   Has been up to chair.  More awake today.   General: NAD Neck: JVP 14+ cm, no thyromegaly or thyroid  nodule.  Lungs: Decreased at bases.  CV: Nondisplaced PMI.  Heart regular S1/S2, mechanical S2, 1/6 SEM RUSB.  1+ edema to knees.   Abdomen: Soft, nontender, no hepatosplenomegaly, no distention.  Skin: Intact without lesions or rashes.  Neurologic: Alert and oriented x 3.  Psych: Normal affect. Extremities: No clubbing or cyanosis.  HEENT: Normal.   CVP 12 on my read this morning but he remains edematous and JVP looks even higher.  Diuresis poor yesterday, weight stable.  Creatinine mildly higher at 2.18 => 2.44, Na low at 129. Diuretic resistance.  - Will continue Lasix  gtt 30 mg/hr with metolazone  5 mg x 1 and acetazolamide  500 bid.  - Will give 3% saline 150 cc over 30 minutes bid x 2 doses today.  Will check BMET in between doses.  - RHC tomorrow to assess full hemodynamics.   Will need to use brachial access if possible given elevated INR. Discussed risks/benefits with wife and patient, they agree to procedure.  - Continue milrinone  0.125 with adequate co-ox 61%.  - Wean midodrine as able, will not change today with SBP 90s.   CRITICAL CARE Performed by: Ezra Shuck  Total critical care time: 40 minutes  Critical care time was exclusive of separately billable procedures and treating other patients.  Critical care was necessary to treat or prevent imminent or life-threatening deterioration.  Critical care was time spent personally by me on the following activities: development of treatment plan with patient and/or surrogate as well as nursing, discussions with consultants, evaluation of patient's response to treatment, examination of patient, obtaining history from patient or surrogate, ordering and performing treatments and interventions, ordering and review of laboratory studies, ordering and review of radiographic studies, pulse oximetry and re-evaluation of patient's condition.  Ezra Shuck 01/22/2024 10:30 AM

## 2024-01-23 ENCOUNTER — Encounter (HOSPITAL_COMMUNITY)
Admission: EM | Disposition: A | Discharge status: Hospice | Payer: Self-pay | Source: Home / Self Care | Attending: Cardiology

## 2024-01-23 DIAGNOSIS — I5023 Acute on chronic systolic (congestive) heart failure: Secondary | ICD-10-CM | POA: Diagnosis not present

## 2024-01-23 DIAGNOSIS — R570 Cardiogenic shock: Secondary | ICD-10-CM | POA: Diagnosis not present

## 2024-01-23 HISTORY — PX: RIGHT HEART CATH: CATH118263

## 2024-01-23 LAB — POCT I-STAT EG7
Acid-Base Excess: 21 mmol/L — ABNORMAL HIGH (ref 0.0–2.0)
Acid-Base Excess: 23 mmol/L — ABNORMAL HIGH (ref 0.0–2.0)
Bicarbonate: 49.5 mmol/L — ABNORMAL HIGH (ref 20.0–28.0)
Bicarbonate: 51.5 mmol/L — ABNORMAL HIGH (ref 20.0–28.0)
Calcium, Ion: 1.15 mmol/L (ref 1.15–1.40)
Calcium, Ion: 1.19 mmol/L (ref 1.15–1.40)
HCT: 46 % (ref 39.0–52.0)
HCT: 47 % (ref 39.0–52.0)
Hemoglobin: 15.6 g/dL (ref 13.0–17.0)
Hemoglobin: 16 g/dL (ref 13.0–17.0)
O2 Saturation: 57 %
O2 Saturation: 57 %
Potassium: 3.2 mmol/L — ABNORMAL LOW (ref 3.5–5.1)
Potassium: 3.3 mmol/L — ABNORMAL LOW (ref 3.5–5.1)
Sodium: 133 mmol/L — ABNORMAL LOW (ref 135–145)
Sodium: 134 mmol/L — ABNORMAL LOW (ref 135–145)
TCO2: >50 mmol/L — ABNORMAL HIGH (ref 22–32)
TCO2: >50 mmol/L — ABNORMAL HIGH (ref 22–32)
pCO2, Ven: 66.7 mmHg — ABNORMAL HIGH (ref 44–60)
pCO2, Ven: 68.5 mmHg — ABNORMAL HIGH (ref 44–60)
pH, Ven: 7.479 — ABNORMAL HIGH (ref 7.25–7.43)
pH, Ven: 7.484 — ABNORMAL HIGH (ref 7.25–7.43)
pO2, Ven: 29 mmHg — CL (ref 32–45)
pO2, Ven: 30 mmHg — CL (ref 32–45)

## 2024-01-23 LAB — MAGNESIUM: Magnesium: 2.8 mg/dL — ABNORMAL HIGH (ref 1.7–2.4)

## 2024-01-23 LAB — BASIC METABOLIC PANEL WITH GFR
Anion gap: 14 (ref 5–15)
Anion gap: 16 — ABNORMAL HIGH (ref 5–15)
Anion gap: 6 (ref 5–15)
BUN: 96 mg/dL — ABNORMAL HIGH (ref 8–23)
BUN: 97 mg/dL — ABNORMAL HIGH (ref 8–23)
BUN: 96 mg/dL — ABNORMAL HIGH (ref 8–23)
CO2: 44 mmol/L — ABNORMAL HIGH (ref 22–32)
CO2: 42 mmol/L — ABNORMAL HIGH (ref 22–32)
CO2: 37 mmol/L — ABNORMAL HIGH (ref 22–32)
Calcium: 10 mg/dL (ref 8.9–10.3)
Calcium: 10.3 mg/dL (ref 8.9–10.3)
Calcium: 9.1 mg/dL (ref 8.9–10.3)
Chloride: 79 mmol/L — ABNORMAL LOW (ref 98–111)
Chloride: 79 mmol/L — ABNORMAL LOW (ref 98–111)
Chloride: 87 mmol/L — ABNORMAL LOW (ref 98–111)
Creatinine, Ser: 2.42 mg/dL — ABNORMAL HIGH (ref 0.61–1.24)
Creatinine, Ser: 2.45 mg/dL — ABNORMAL HIGH (ref 0.61–1.24)
Creatinine, Ser: 2.38 mg/dL — ABNORMAL HIGH (ref 0.61–1.24)
GFR, Estimated: 26 mL/min — ABNORMAL LOW (ref >60)
GFR, Estimated: 26 mL/min — ABNORMAL LOW (ref >60)
GFR, Estimated: 27 mL/min — ABNORMAL LOW (ref >60)
Glucose, Bld: 132 mg/dL — ABNORMAL HIGH (ref 70–99)
Glucose, Bld: 183 mg/dL — ABNORMAL HIGH (ref 70–99)
Glucose, Bld: 187 mg/dL — ABNORMAL HIGH (ref 70–99)
Potassium: 3.1 mmol/L — ABNORMAL LOW (ref 3.5–5.1)
Potassium: 3.6 mmol/L (ref 3.5–5.1)
Potassium: >7.5 mmol/L (ref 3.5–5.1)
Sodium: 137 mmol/L (ref 135–145)
Sodium: 137 mmol/L (ref 135–145)
Sodium: 130 mmol/L — ABNORMAL LOW (ref 135–145)

## 2024-01-23 LAB — COOXEMETRY PANEL
Carboxyhemoglobin: 2.3 % — ABNORMAL HIGH (ref 0.5–1.5)
Methemoglobin: <0.7 % (ref 0.0–1.5)
O2 Saturation: 70.3 %
Total hemoglobin: 13.8 g/dL (ref 12.0–16.0)

## 2024-01-23 LAB — CBC
HCT: 40.6 % (ref 39.0–52.0)
Hemoglobin: 12.7 g/dL — ABNORMAL LOW (ref 13.0–17.0)
MCH: 20 pg — ABNORMAL LOW (ref 26.0–34.0)
MCHC: 31.3 g/dL (ref 30.0–36.0)
MCV: 63.9 fL — ABNORMAL LOW (ref 80.0–100.0)
Platelets: 152 10*3/uL (ref 150–400)
RBC: 6.35 MIL/uL — ABNORMAL HIGH (ref 4.22–5.81)
RDW: 19 % — ABNORMAL HIGH (ref 11.5–15.5)
WBC: 14.7 10*3/uL — ABNORMAL HIGH (ref 4.0–10.5)
nRBC: 1.6 % — ABNORMAL HIGH (ref 0.0–0.2)

## 2024-01-23 LAB — GLUCOSE, CAPILLARY
Glucose-Capillary: 101 mg/dL — ABNORMAL HIGH (ref 70–99)
Glucose-Capillary: 201 mg/dL — ABNORMAL HIGH (ref 70–99)
Glucose-Capillary: 152 mg/dL — ABNORMAL HIGH (ref 70–99)
Glucose-Capillary: 185 mg/dL — ABNORMAL HIGH (ref 70–99)

## 2024-01-23 LAB — PROTIME-INR
INR: 3.3 — ABNORMAL HIGH (ref 0.8–1.2)
Prothrombin Time: 35.4 s — ABNORMAL HIGH (ref 11.4–15.2)

## 2024-01-23 LAB — POTASSIUM: Potassium: 3.9 mmol/L (ref 3.5–5.1)

## 2024-01-23 SURGERY — RIGHT HEART CATH
Anesthesia: LOCAL

## 2024-01-23 MED ORDER — LIDOCAINE HCL (PF) 1 % IJ SOLN
INTRAMUSCULAR | Status: DC | PRN
  Administered 2024-01-23: 2 mL

## 2024-01-23 MED ORDER — WARFARIN SODIUM 1 MG PO TABS
1.0000 mg | ORAL_TABLET | Freq: Once | ORAL | Status: AC
  Administered 2024-01-23: 1 mg via ORAL
  Filled 2024-01-23: qty 1

## 2024-01-23 MED ORDER — LIDOCAINE HCL (PF) 1 % IJ SOLN
INTRAMUSCULAR | Status: AC
  Filled 2024-01-23: qty 30

## 2024-01-23 MED ORDER — HEPARIN (PORCINE) IN NACL 1000-0.9 UT/500ML-% IV SOLN
INTRAVENOUS | Status: DC | PRN
  Administered 2024-01-23: 500 mL

## 2024-01-23 MED ORDER — SODIUM CHLORIDE 3 % IV BOLUS
150.0000 mL | Freq: Two times a day (BID) | INTRAVENOUS | Status: AC
  Administered 2024-01-23 (×2): 150 mL via INTRAVENOUS
  Filled 2024-01-23 (×2): qty 500

## 2024-01-23 MED ORDER — ENSURE PLUS HIGH PROTEIN PO LIQD
237.0000 mL | Freq: Three times a day (TID) | ORAL | Status: DC
  Administered 2024-01-23 – 2024-01-26 (×8): 237 mL via ORAL

## 2024-01-23 MED ORDER — POTASSIUM CHLORIDE CRYS ER 20 MEQ PO TBCR
40.0000 meq | EXTENDED_RELEASE_TABLET | Freq: Once | ORAL | Status: AC
  Administered 2024-01-23: 40 meq via ORAL
  Filled 2024-01-23: qty 2

## 2024-01-23 MED ORDER — AMIODARONE HCL IN DEXTROSE 360-4.14 MG/200ML-% IV SOLN
30.0000 mg/h | INTRAVENOUS | Status: DC
  Administered 2024-01-23 – 2024-01-25 (×5): 30 mg/h via INTRAVENOUS
  Filled 2024-01-23 (×5): qty 200

## 2024-01-23 MED ORDER — POTASSIUM CHLORIDE CRYS ER 20 MEQ PO TBCR
40.0000 meq | EXTENDED_RELEASE_TABLET | ORAL | Status: AC
  Administered 2024-01-23 (×3): 40 meq via ORAL
  Filled 2024-01-23 (×3): qty 2

## 2024-01-23 MED ORDER — POTASSIUM CHLORIDE 10 MEQ/50ML IV SOLN
10.0000 meq | INTRAVENOUS | Status: DC
  Administered 2024-01-23 (×2): 10 meq via INTRAVENOUS
  Filled 2024-01-23 (×4): qty 50

## 2024-01-23 SURGICAL SUPPLY — 5 items
CATH SWAN GANZ 7F STRAIGHT (CATHETERS) IMPLANT
GLIDESHEATH SLENDER 7FR .021G (SHEATH) IMPLANT
PACK CARDIAC CATHETERIZATION (CUSTOM PROCEDURE TRAY) ×2 IMPLANT
TRANSDUCER W/STOPCOCK (MISCELLANEOUS) IMPLANT
TUBING ART PRESS 72 MALE/FEM (TUBING) IMPLANT

## 2024-01-23 NOTE — Progress Notes (Signed)
 Patient with new swelling and redness of right forearm. Warm to touch. Saline locked IV previously there this am, now removed. RHC this morning was R brachial access.   Will order US  of RUE.   Beckey Coe, AGACNP-BC  01/23/24 4:14 PM  Advanced Heart Failure Team

## 2024-01-23 NOTE — Progress Notes (Signed)
 Physical Therapy Treatment Patient Details Name: Adrian Lambert MRN: [id number] DOB: 08-06-1940 Today's Date: 01/23/2024   History of Present Illness Pt is 83 yo presenting to [facility] ED on 6/10 due to shortness of breath Developed cardiogenic shock, progressive hypotension and signs of hyperperfusion. Admitted to ICU for inotropes. PMH: arthritis, systolic and diastolic CHF, diverticulosis, HTN, incomplete RBBB, PAF    PT Comments  Pt admitted with above diagnosis. Pt tolerated OOB to chair with use of STedy and max to min assist of 2 persons throughout. Pt continues to have weakness and fatigue.  Pt did stand to [facility] for 1 1/2 minutes.  Daughter present and states that she is anxious for Rehab as her mom is still committed to caring for pt at home. She was asking about renting a Stedy and discussed that with her a little bit. Will follow acutely.  Pt currently with functional limitations due to the deficits listed below (see PT Problem List). Pt will benefit from acute skilled PT to increase their independence and safety with mobility to allow discharge.       If plan is discharge home, recommend the following: Help with stairs or ramp for entrance;Assist for transportation;Assistance with cooking/housework;Two people to help with walking and/or transfers;A lot of help with bathing/dressing/bathroom   Can travel by private vehicle        Equipment Recommendations  None recommended by PT    Recommendations for Other Services       Precautions / Restrictions Precautions Precautions: Fall Recall of Precautions/Restrictions: Intact Precaution/Restrictions Comments: watch 02, HR, Restrictions Weight Bearing Restrictions Per Provider Order: No     Mobility  Bed Mobility Overal bed mobility: Needs Assistance Bed Mobility: Rolling Rolling: Mod assist Sidelying to sit: Mod assist, HOB elevated Supine to sit: Mod assist, HOB elevated     General bed mobility comments: pt noted to reach  with R UE for bed rail to help once initiated. Needed assist to initiate LEs and torso and v/cues throughout    Transfers Overall transfer level: Needs assistance Equipment used: Ambulation equipment used Transfers: Sit to/from Stand, Bed to chair/wheelchair/BSC Sit to Stand: Max assist, From elevated surface, Via lift equipment, +2 physical assistance (stedy)           General transfer comment: Pt stood to [place] with max assist progressing to mod assist of 2 persons. Incr time to move buttock pads. Once up, pt can stand with verbala and tactile cues and min assist for up to 1 1/2 min as nursing was changing buttock dressing.  Moved pt to chair and he was able to stand again to get into chair. Transfer via Lift Equipment: Stedy  Ambulation/Gait                   Stairs             Wheelchair Mobility     Tilt Bed    Modified Rankin (Stroke Patients Only)       Balance Overall balance assessment: Needs assistance Sitting-balance support: Bilateral upper extremity supported, Feet supported Sitting balance-Leahy Scale: Poor Sitting balance - Comments: min assist EOB due to pt shaky   Standing balance support: Bilateral upper extremity supported, During functional activity, Reliant on assistive device for balance Standing balance-Leahy Scale: Poor Standing balance comment: BIL UEs on stedy with min assist of 2 persons  Communication Communication Communication: Impaired Factors Affecting Communication: Reduced clarity of speech  Cognition Arousal: Lethargic Behavior During Therapy: Flat affect   PT - Cognitive impairments: History of cognitive impairments                         Following commands: Intact      Cueing Cueing Techniques: Verbal cues, Tactile cues, Visual cues  Exercises General Exercises - Lower Extremity Ankle Circles/Pumps: AAROM, Strengthening, Both, 10 reps Long Arc Quad: AROM, Both,  10 reps, Seated Hip Flexion/Marching: AROM, Both, 10 reps Other Exercises Other Exercises: toe taps in sitting    General Comments General comments (skin integrity, edema, etc.): 98/68 initially and 90/61 end of session.  Pt on 6LO2 with sats dropping to 87-88% with activity.      Pertinent Vitals/Pain Pain Assessment Pain Assessment: No/denies pain    Home Living                          Prior Function            PT Goals (current goals can now be found in the care plan section) Progress towards PT goals: Progressing toward goals    Frequency    Min 2X/week      PT Plan      Co-evaluation              AM-PAC PT 6 Clicks Mobility   Outcome Measure  Help needed turning from your back to your side while in a flat bed without using bedrails?: A Little Help needed moving from lying on your back to sitting on the side of a flat bed without using bedrails?: A Lot Help needed moving to and from a bed to a chair (including a wheelchair)?: Total Help needed standing up from a chair using your arms (e.g., wheelchair or bedside chair)?: Total Help needed to walk in hospital room?: Total Help needed climbing 3-5 steps with a railing? : Total 6 Click Score: 9    End of Session Equipment Utilized During Treatment: Gait belt;Oxygen Activity Tolerance: Other (comment);Patient limited by fatigue (weakness) Patient left: in chair;with call bell/phone within reach;with family/visitor present Nurse Communication: Mobility status;Need for lift equipment (recommend stedy +2 assist) PT Visit Diagnosis: Unsteadiness on feet (R26.81);Muscle weakness (generalized) (M62.81)     Time: 8793-8764 PT Time Calculation (min) (ACUTE ONLY): 29 min  Charges:    $Therapeutic Exercise: 8-22 mins $Therapeutic Activity: 8-22 mins PT General Charges $$ ACUTE PT VISIT: 1 Visit                     Jonique Kulig M,PT Acute Rehab Services [phone]    [name] 01/23/2024,  1:38 PM

## 2024-01-23 NOTE — Progress Notes (Signed)
 PT Cancellation Note  Patient Details Name: Adrian Lambert MRN: [id number] DOB: [birth year]   Cancelled Treatment:    Reason Eval/Treat Not Completed: Patient at procedure or test/unavailable (Pt in Cath lab. Will return as able and depending on if pt is on bedrest after cath.)   [name] 01/23/2024, 8:24 AM [name],PT Acute Rehab Services [phone]

## 2024-01-23 NOTE — Progress Notes (Signed)
 Patient ID: Adrian Lambert, male   DOB: 10/14/1940, 83 y.o.   MRN: [id number]  Advanced Heart Failure Rounding Note  [facility] Cardiologist: Rolan Fuel, MD Chief Complaint: Cardiogenic Shock Subjective:   - Moved to ICU on 6/11 for cardiogenic shock.   - 6/21  lasix  drip increased to 60 mg per hour and  Hypertonic Saline added.  - 6/25 Hypertonic saline stopped. Given 15 mg  tolvaptan .  - 7/1 Hypertonic saline resumed, good UOP - 7/2 RHC:  RA mean 14 RV 49/8 PA 53/24, mean 35 PCWP mean 19 Oxygen saturations: PA 57% AO 93% Cardiac Output (Fick) 4.34  Cardiac Index (Fick) 2.14  PVR 3.7 Wu Cardiac Output (Thermo) 3.17  Cardiac Index (Thermo) 1.56 PVR 5.04 Wu PAPi 2   Good response yesterday to 3% saline infusion, creatinine stable at 2.42, I/Os net negative 4499.  HCO3 44 today.   He remains on milrinone  0.125.  He appears to be in atrial fibrillation this morning rate 90s, on po amiodarone .  INR down to 3.3.   SBP remains in 90s on midodrine.   He remains weak and fatigued.   Objective:    Vital Signs:   Temp:  [97.5 F (36.4 C)-97.9 F (36.6 C)] 97.7 F (36.5 C) (07/02 0411) Pulse Rate:  [75-122] 94 (07/02 0814) Resp:  [11-24] 18 (07/02 0814) BP: (80-109)/(59-80) 92/76 (07/02 0814) SpO2:  [82 %-98 %] 92 % (07/02 0814) Weight:  [81.5 kg-84.5 kg] 81.5 kg (07/02 0411) Last BM Date : 01/22/24  Weight change: Filed Weights   01/22/24 0415 01/22/24 1442 01/23/24 0411  Weight: 84.5 kg 84.5 kg 81.5 kg   Intake/Output:  Intake/Output Summary (Last 24 hours) at 01/23/2024 0828 Last data filed at 01/23/2024 0700 Gross per 24 hour  Intake 901.06 ml  Output 5400 ml  Net -4498.94 ml   General: NAD Neck: JVP 12 cm, no thyromegaly or thyroid  nodule.  Lungs: Clear to auscultation bilaterally with normal respiratory effort. CV: Nondisplaced PMI.  Heart irregular S1/S2 with mechanical S2, no S3/S4, 2/6 SEM RUSB.  1+ ankle edema.   Abdomen: Soft, nontender, no hepatosplenomegaly,  no distention.  Skin: Intact without lesions or rashes.  Neurologic: Lethargic but answers questions.  Extremities: No clubbing or cyanosis.  HEENT: Normal.   Labs: Basic Metabolic Panel: Recent Labs  Lab 01/16/24 1133 01/16/24 1409 01/16/24 2146 01/17/24 0130 01/17/24 1715 01/18/24 0103 01/18/24 1400 01/19/24 0453 01/20/24 0521 01/20/24 1510 01/21/24 0453 01/22/24 0358 01/22/24 1333 01/23/24 0404  NA 126*   < > 127*   < > 127*   < > 127* 127* 129* 129* 131* 129* 135 137  K 4.2  --  4.3   < > 4.8   < > 4.1 3.9 3.0* 3.4* 3.8 4.2 3.4* 3.1*  CL 75*  --  72*   < > 74*   < > 76* 75* 74* 75* 79* 80* 81* 79*  CO2 39*  --  38*   < > 38*   < > 40* 37* 43* 37* 39* 37* 40* 44*  GLUCOSE 179*  --  149*   < > 93   < > 110* 153* 137* 141* 161* 175* 145* 132*  BUN 61*  --  66*   < > 67*   < > 66* 73* 76* 77* 79* 89* 92* 96*  CREATININE 1.67*  --  1.64*   < > 1.72*   < > 1.77* 1.88* 1.97* 2.00* 2.18* 2.44* 2.34* 2.42*  CALCIUM 8.9  --  9.5   < > 9.3   < > 9.3 9.6 9.3 9.3 9.7 9.7 9.7 10.0  MG  --   --   --    < >  --    < >  --  2.7* 2.6*  --  2.7* 2.8*  --  2.8*  PHOS 2.4*  --  3.0  --  3.3  --  3.2  --   --   --   --   --   --   --    < > = values in this interval not displayed.   Liver Function Tests: Recent Labs  Lab 01/16/24 1133 01/16/24 2146 01/17/24 0928 01/17/24 1715 01/18/24 1400  AST  --   --  28  --   --   ALT  --   --  20  --   --   ALKPHOS  --   --  113  --   --   BILITOT  --   --  3.0*  --   --   PROT  --   --  5.7*  --   --   ALBUMIN 3.1* 3.3* 3.2* 3.2* 3.1*    CBC: Recent Labs  Lab 01/19/24 0453 01/20/24 0521 01/21/24 0453 01/22/24 0358 01/23/24 0404  WBC 17.3* 15.0* 14.2* 15.5* 14.7*  HGB 13.9 13.1 13.4 13.4 12.7*  HCT 43.2 41.3 42.1 42.6 40.6  MCV 63.2* 63.3* 63.8* 63.3* 63.9*  PLT 210 168 155 160 152   Medications:    Scheduled Medications:  [MAR Hold] acetaZOLAMIDE   500 mg Oral BID   [MAR Hold] allopurinol   150 mg Oral Daily   [MAR Hold]  aspirin  EC  81 mg Oral Daily   [MAR Hold] Chlorhexidine  Gluconate Cloth  6 each Topical Daily   [MAR Hold] feeding supplement  237 mL Oral BID BM   [MAR Hold] fesoterodine   4 mg Oral QODAY   [MAR Hold] insulin  aspart  0-15 Units Subcutaneous TID WC   [MAR Hold] midodrine  15 mg Oral Q8H   [MAR Hold] potassium chloride   40 mEq Oral Daily   [MAR Hold] tamsulosin   0.4 mg Oral QODAY   [MAR Hold] Warfarin - Pharmacist Dosing Inpatient   Does not apply q1600   Infusions:  sodium chloride  10 mL/hr at 01/23/24 0700   amiodarone      furosemide  (LASIX ) 200 mg in dextrose  5 % 100 mL (2 mg/mL) infusion 30 mg/hr (01/23/24 0716)   milrinone  0.125 mcg/kg/min (01/23/24 0700)   [MAR Hold] potassium chloride      PRN Medications: Heparin  (Porcine) in NaCl, lidocaine  (PF), [MAR Hold] ondansetron  (ZOFRAN ) IV, [MAR Hold] mouth rinse, [MAR Hold] traZODone   Assessment/Plan:  1. Acute on chronic systolic CHF -> cardiogenic shock:  - Coronary angiography in 1/23  mild CAD.  - Echo 01/03/24  EF 25-30% RV mod reduced - Severe diuretic resistance; previously diuresed with lasix  60mg /hr with hypertonic saline during this admission.  - He was given hypertonic again 7/1 with excellent response.  RHC today showed filling pressures still elevated but not markedly.  With stable creatinine and ongoing volume overload/oxygen requirement, will continue Lasix  gtt 30 mg/hr + acetazolamide  + 3% saline 150 cc over 30 minutes bid x 2 doses again today.  Will check BMET in between doses.  No metolazone  today. Hopefully to po tomorrow.  - Continue midodrine to 15mg  TID, wean as tolerated. SBP still generally in 90s.  - CO was low on RHC 7/2 on milrinone  0.125.  Will  increase to 0.25 mcg/kg/min while we are diuresing.  I worry that we do not have a good end point, ?palliative home inotropes vs hospice/comfort care if we are unable to wean milrinone .  Not a candidate for advanced therapies, would do poorly with dialysis.  I am going to  consult palliative care.  - Hold SGLT2i while he is with purewick and minimally active. Can restart when more mobile.   2. Mechanical aortic valve: Continue warfarin and ASA 81. Goal INR 2.5-3.5.  - INR 3.3, discussed with pharmD.   3. Paroxysmal Atrial fibrillation/atrial flutter:  - Admitted 1/18 with atrial fibrillation with RVR and CHF.  EF down to 20-25%.  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   - In cardiogenic shock in setting of recurrent AF.  - Failed DC-CV on 6/13 but went back to NSR.  - Possible AF on telemetry today, will get ECG.   - Transition to amiodarone  gtt 30 mg/hr with ?AF this morning and increasing milrinone .  - Continue coumadin .   4. Ascending aortic aneurysm: 4.3 cm on 4/24 MRA chest.   5. AKI on CKD2: Creatinine 2.14 on admit. Baseline 1.5. Suspect cardio-renal.  - Creatinine 2.4 today, stable.  - see above  6. Hypokalemia: Replace K.    7. Shock liver - likely 2/2 low output/hepatic congestion   8. Hyponatremia - Resolved, Na 137 today.    9. ILD - CT reviewed with P/CCM initially  Mobilize, out of bed with PT/OT.  Palliative care consultation.   Length of Stay: 22   Ezra Shuck, MD 01/23/2024, 8:28 AM  Advanced Heart Failure Team Pager [phone] (M-F; 7a - 4p). For after hours, see Amion for on call HF MD  CRITICAL CARE Performed by: Ezra Shuck  Total critical care time: 45 minutes  Critical care time was exclusive of separately billable procedures and treating other patients.  Critical care was necessary to treat or prevent imminent or life-threatening deterioration.  Critical care was time spent personally by me on the following activities: development of treatment plan with patient and/or surrogate as well as nursing, discussions with consultants, evaluation of patient's response to treatment, examination of patient, obtaining history from patient or surrogate, ordering and performing treatments and  interventions, ordering and review of laboratory studies, ordering and review of radiographic studies, pulse oximetry and re-evaluation of patient's condition.

## 2024-01-23 NOTE — Progress Notes (Signed)
 ANTICOAGULATION CONSULT NOTE  Pharmacy Consult for Warfarin Indication: afib with mechanical AVR  Allergies  Allergen Reactions   Celecoxib Rash    Other Reaction(s): Unknown    Patient Measurements: Height: 5' 10 (177.8 cm) Weight: 81.5 kg (179 lb 10.8 oz) IBW/kg (Calculated) : 73  Vital Signs: Temp: 97.2 F (36.2 C) (07/02 1100) Temp Source: Axillary (07/02 1100) BP: 92/76 (07/02 0814) Pulse Rate: 94 (07/02 0814)  Labs: Recent Labs    01/21/24 0453 01/22/24 0358 01/22/24 1333 01/23/24 0404 01/23/24 0957  HGB 13.4 13.4  --  12.7*  --   HCT 42.1 42.6  --  40.6  --   PLT 155 160  --  152  --   LABPROT 33.9* 35.8*  --  35.4*  --   INR 3.2* 3.4*  --  3.3*  --   CREATININE 2.18* 2.44* 2.34* 2.42* 2.45*    Estimated Creatinine Clearance: 24 mL/min (A) (by C-G formula based on SCr of 2.45 mg/dL (H)).   Medical History: Past Medical History:  Diagnosis Date   Anticoagulant long-term use    Arthritis    probably in my thumbs (06/26/2012)   Bifascicular block    Chronic combined systolic and diastolic CHF (congestive heart failure) (HCC)    a. 01/2016 Echo: EF 45-50%, gr2 DD, inflat AK (poor acoustic windows);  b. 07/2016 Echo: EF 25-30%, mild LVH, PASP (pt in Afib).   Complication of anesthesia    wake up w/a start; hallucinations (06/26/2012)   Critical Aortic Stenosis    a. 03/2003 s/p SJM #25 mech prosthetic AoV;  b. 07/2016 Echo: EF 25-30% (in setting of AF), AoV area 1.72 cm^2 (VTI), 1.75 cm^2 (Vmean).   Diverticulosis    Gouty arthritis    have had it in both feet, ankles, right knee (06/26/2012)   History of diverticulitis of colon    History of kidney stones    History of pancreatitis    Hypertension    Incomplete RBBB    PAF (paroxysmal atrial fibrillation) (HCC)    a. CHA2DS2VASc = 4-->chronic coumadin  in setting of mech AoV;  b. 07/2016 Recurrent AF RVR.    Medications:  Medications Prior to Admission  Medication Sig Dispense Refill Last  Dose/Taking   acetaminophen  (TYLENOL ) 500 MG tablet Take 500 mg by mouth 2 (two) times daily.   01/01/2024   albuterol  (VENTOLIN  HFA) 108 (90 Base) MCG/ACT inhaler Inhale 2 puffs into the lungs every 4 (four) hours.   01/01/2024   allopurinol  (ZYLOPRIM ) 300 MG tablet Take 150 mg by mouth daily.   01/01/2024   aspirin  EC 81 MG tablet Take 81 mg by mouth daily. Swallow whole.   01/01/2024   clobetasol cream (TEMOVATE) 0.05 % Apply 1 Application topically 2 (two) times daily.   01/01/2024   colchicine  0.6 MG tablet Take 1 tablet (0.6 mg total) by mouth daily as needed (for gout).   Unknown   FARXIGA  10 MG TABS tablet TAKE 1 TABLET BY MOUTH EVERY DAY 30 tablet 6 01/01/2024   FIBER PO Take 2 capsules by mouth 2 (two) times daily.   01/01/2024   folic acid  (FOLVITE ) 1 MG tablet TAKE 1 TABLET(1 MG) BY MOUTH DAILY 90 tablet 3 01/01/2024   metoprolol  succinate (TOPROL -XL) 25 MG 24 hr tablet TAKE 1 TABLET BY MOUTH EVERYDAY AT BEDTIME 90 tablet 1 12/31/2023   potassium chloride  SA (KLOR-CON  M) 20 MEQ tablet Take 1 tablet (20 mEq total) by mouth daily.   01/01/2024  tamsulosin  (FLOMAX ) 0.4 MG CAPS capsule Take 1 capsule (0.4 mg total) by mouth every other day. 90 capsule 3 01/01/2024   tolterodine  (DETROL  LA) 4 MG 24 hr capsule Take 1 capsule (4 mg total) by mouth every other day. Alternating days with the tamsulosin  90 capsule 3 12/31/2023   torsemide  (DEMADEX ) 20 MG tablet Take 3 tablets (60 mg total) by mouth daily.   01/01/2024   warfarin (COUMADIN ) 5 MG tablet Take 0.5-1 tablets (2.5-5 mg total) by mouth See admin instructions. 1/19 and 1/20  take 7.5mg  (1 and 1/2 tab) then take 1 tab (5mg ) daily until seen by MD  Take 1 tablet (5 mg) by mouth on Sundays, Mondays, Wednesdays, Thursdays & Saturdays in the evening. Take 0.5 tablet (2.5 mg) by mouth on Tuesdays & Fridays in the evening. (Patient taking differently: Take 2.5-5 mg by mouth See admin instructions. AS directed by PCP 2.5 mg Daily) 45 tablet 11 12/31/2023 at   8:00 PM   docusate sodium  (COLACE) 100 MG capsule Take 100 mg by mouth 2 (two) times daily. (Patient not taking: Reported on 01/01/2024)   Not Taking   polyethylene glycol powder (GLYCOLAX /MIRALAX ) 17 GM/SCOOP powder Take 17 g by mouth 2 (two) times daily as needed for moderate constipation or mild constipation. (Patient not taking: Reported on 12/26/2023) 238 g 2    Scheduled:   acetaZOLAMIDE   500 mg Oral BID   allopurinol   150 mg Oral Daily   aspirin  EC  81 mg Oral Daily   Chlorhexidine  Gluconate Cloth  6 each Topical Daily   feeding supplement  237 mL Oral TID WC & HS   fesoterodine   4 mg Oral QODAY   insulin  aspart  0-15 Units Subcutaneous TID WC   midodrine  15 mg Oral Q8H   potassium chloride   40 mEq Oral Daily   tamsulosin   0.4 mg Oral QODAY   Warfarin - Pharmacist Dosing Inpatient   Does not apply q1600   Infusions:   amiodarone  30 mg/hr (01/23/24 0955)   furosemide  (LASIX ) 200 mg in dextrose  5 % 100 mL (2 mg/mL) infusion 30 mg/hr (01/23/24 0716)   milrinone  0.25 mcg/kg/min (01/23/24 1010)   sodium chloride  3% (hypertonic) 150 mL (01/23/24 1017)   PRN: ondansetron  (ZOFRAN ) IV, mouth rinse, traZODone   Assessment: 83 yo male presenting with worsening shortness of breath. On warfarin PTA for afib with mechanical AVR.    Per patient, dose is 5 mg daily, poor historian. Upon chart review patient should be on 2.5 mg once daily on Monday, Tuesday, Thursday, Friday, and Sunday and 1 mg on Wednesday and Saturday per last PCP note on 12/18/2023. Goal INR is 2.5-3.5.   Pertinent DDI's - amio IV (none PTA, 6/12 >20, 7/2 >), PO amio (200 mg 6/20 >6/23, 400 mg 6/23 >7/2), allopurinol  (PTA).   INR 3.3 is therapeutic.  Dose held yesterday in anticipation of RHC / uptrending INR.  CBC stable (Hgb 12.7, PLT 152).  No bleeding noted.  Restarting IV amiodarone  this morning post RHC (7/2).  Per RN, reduced PO intake over the past few days (~25% meals) also receiving nutritional supplements (Ensure  BID).    Goal of Therapy:  INR 2.5-3.5 Monitor platelets by anticoagulation protocol: Yes   Plan:  Warfarin 1 mg x 1 (restarting PTA regimen) Monitor for s/sx of bleeding, daily INR, CBC Watch for new DDIs and changes in appetite  Maurilio Fila, PharmD Clinical Pharmacist 01/23/2024  11:08 AM

## 2024-01-23 NOTE — Interval H&P Note (Signed)
 History and Physical Interval Note:  01/23/2024 7:52 AM  Adrian Lambert  has presented today for surgery, with the diagnosis of heart failure.  The various methods of treatment have been discussed with the patient and family. After consideration of risks, benefits and other options for treatment, the patient has consented to  Procedure(s): RIGHT HEART CATH (N/A) as a surgical intervention.  The patient's history has been reviewed, patient examined, no change in status, stable for surgery.  I have reviewed the patient's chart and labs.  Questions were answered to the patient's satisfaction.     Yexalen Deike [employer]

## 2024-01-23 NOTE — Progress Notes (Signed)
   Inpatient Rehabilitation Admissions Coordinator   Await palliative consult and further progress with therapies to determine GOC and his needs.  Heron Leavell, RN, MSN Rehab Admissions Coordinator [phone] 01/23/2024 12:22 PM

## 2024-01-24 ENCOUNTER — Inpatient Hospital Stay (HOSPITAL_COMMUNITY)

## 2024-01-24 ENCOUNTER — Encounter (HOSPITAL_COMMUNITY): Payer: Self-pay | Admitting: Cardiology

## 2024-01-24 DIAGNOSIS — I5023 Acute on chronic systolic (congestive) heart failure: Secondary | ICD-10-CM | POA: Diagnosis not present

## 2024-01-24 DIAGNOSIS — Z66 Do not resuscitate: Secondary | ICD-10-CM | POA: Diagnosis not present

## 2024-01-24 DIAGNOSIS — Z515 Encounter for palliative care: Secondary | ICD-10-CM

## 2024-01-24 DIAGNOSIS — I5021 Acute systolic (congestive) heart failure: Secondary | ICD-10-CM | POA: Diagnosis not present

## 2024-01-24 DIAGNOSIS — M7989 Other specified soft tissue disorders: Secondary | ICD-10-CM

## 2024-01-24 DIAGNOSIS — R531 Weakness: Secondary | ICD-10-CM | POA: Diagnosis not present

## 2024-01-24 DIAGNOSIS — R570 Cardiogenic shock: Secondary | ICD-10-CM | POA: Diagnosis not present

## 2024-01-24 LAB — BASIC METABOLIC PANEL WITH GFR
Anion gap: 17 — ABNORMAL HIGH (ref 5–15)
BUN: 103 mg/dL — ABNORMAL HIGH (ref 8–23)
CO2: 42 mmol/L — ABNORMAL HIGH (ref 22–32)
Calcium: 10.3 mg/dL (ref 8.9–10.3)
Chloride: 79 mmol/L — ABNORMAL LOW (ref 98–111)
Creatinine, Ser: 2.33 mg/dL — ABNORMAL HIGH (ref 0.61–1.24)
GFR, Estimated: 27 mL/min — ABNORMAL LOW (ref >60)
Glucose, Bld: 170 mg/dL — ABNORMAL HIGH (ref 70–99)
Potassium: 3.4 mmol/L — ABNORMAL LOW (ref 3.5–5.1)
Sodium: 138 mmol/L (ref 135–145)

## 2024-01-24 LAB — GLUCOSE, CAPILLARY
Glucose-Capillary: 208 mg/dL — ABNORMAL HIGH (ref 70–99)
Glucose-Capillary: 118 mg/dL — ABNORMAL HIGH (ref 70–99)
Glucose-Capillary: 125 mg/dL — ABNORMAL HIGH (ref 70–99)
Glucose-Capillary: 136 mg/dL — ABNORMAL HIGH (ref 70–99)

## 2024-01-24 LAB — PROTIME-INR
INR: 2.9 — ABNORMAL HIGH (ref 0.8–1.2)
Prothrombin Time: 31.7 s — ABNORMAL HIGH (ref 11.4–15.2)

## 2024-01-24 LAB — CBC
HCT: 42.4 % (ref 39.0–52.0)
Hemoglobin: 13.3 g/dL (ref 13.0–17.0)
MCH: 20.2 pg — ABNORMAL LOW (ref 26.0–34.0)
MCHC: 31.4 g/dL (ref 30.0–36.0)
MCV: 64.4 fL — ABNORMAL LOW (ref 80.0–100.0)
Platelets: 118 10*3/uL — ABNORMAL LOW (ref 150–400)
RBC: 6.58 MIL/uL — ABNORMAL HIGH (ref 4.22–5.81)
RDW: 19.6 % — ABNORMAL HIGH (ref 11.5–15.5)
WBC: 15.8 10*3/uL — ABNORMAL HIGH (ref 4.0–10.5)
nRBC: 1.9 % — ABNORMAL HIGH (ref 0.0–0.2)

## 2024-01-24 LAB — MAGNESIUM: Magnesium: 2.9 mg/dL — ABNORMAL HIGH (ref 1.7–2.4)

## 2024-01-24 LAB — COOXEMETRY PANEL
Carboxyhemoglobin: 2.5 % — ABNORMAL HIGH (ref 0.5–1.5)
Methemoglobin: <0.7 % (ref 0.0–1.5)
O2 Saturation: 65.4 %
Total hemoglobin: 13.7 g/dL (ref 12.0–16.0)

## 2024-01-24 MED ORDER — FENTANYL CITRATE PF 50 MCG/ML IJ SOSY
25.0000 ug | PREFILLED_SYRINGE | INTRAMUSCULAR | Status: DC | PRN
  Administered 2024-01-25 – 2024-01-26 (×3): 25 ug via INTRAVENOUS
  Filled 2024-01-24 (×3): qty 1

## 2024-01-24 MED ORDER — POTASSIUM CHLORIDE CRYS ER 20 MEQ PO TBCR
40.0000 meq | EXTENDED_RELEASE_TABLET | Freq: Two times a day (BID) | ORAL | Status: DC
  Administered 2024-01-24: 40 meq via ORAL
  Filled 2024-01-24: qty 2

## 2024-01-24 MED ORDER — TORSEMIDE 20 MG PO TABS
60.0000 mg | ORAL_TABLET | Freq: Two times a day (BID) | ORAL | Status: DC
  Administered 2024-01-24 – 2024-01-25 (×3): 60 mg via ORAL
  Filled 2024-01-24 (×4): qty 3

## 2024-01-24 MED ORDER — POTASSIUM CHLORIDE CRYS ER 20 MEQ PO TBCR
40.0000 meq | EXTENDED_RELEASE_TABLET | Freq: Once | ORAL | Status: AC
  Administered 2024-01-24: 40 meq via ORAL
  Filled 2024-01-24: qty 2

## 2024-01-24 MED ORDER — WARFARIN SODIUM 2.5 MG PO TABS: 2.5000 mg | ORAL_TABLET | Freq: Once | ORAL | Status: DC

## 2024-01-24 NOTE — Progress Notes (Addendum)
 ANTICOAGULATION CONSULT NOTE  Pharmacy Consult for Warfarin Indication: afib with mechanical AVR  Allergies  Allergen Reactions   Celecoxib Rash    Other Reaction(s): Unknown    Patient Measurements: Height: 5' 10 (177.8 cm) Weight: 81.2 kg (179 lb 0.2 oz) IBW/kg (Calculated) : 73  Vital Signs: Temp: 97.6 F (36.4 C) (07/03 0400) Temp Source: Oral (07/03 0400) BP: 91/66 (07/03 0630) Pulse Rate: 85 (07/03 0645)  Labs: Recent Labs    01/22/24 0358 01/22/24 1333 01/23/24 0404 01/23/24 0807 01/23/24 0808 01/23/24 0957 01/23/24 1832 01/24/24 0432  HGB 13.4  --  12.7* 16.0 15.6  --   --  13.3  HCT 42.6  --  40.6 47.0 46.0  --   --  42.4  PLT 160  --  152  --   --   --   --  118*  LABPROT 35.8*  --  35.4*  --   --   --   --  31.7*  INR 3.4*  --  3.3*  --   --   --   --  2.9*  CREATININE 2.44*   < > 2.42*  --   --  2.45* 2.38* 2.33*   < > = values in this interval not displayed.    Estimated Creatinine Clearance: 25.2 mL/min (A) (by C-G formula based on SCr of 2.33 mg/dL (H)).   Medical History: Past Medical History:  Diagnosis Date   Anticoagulant long-term use    Arthritis    probably in my thumbs (06/26/2012)   Bifascicular block    Chronic combined systolic and diastolic CHF (congestive heart failure) (HCC)    a. 01/2016 Echo: EF 45-50%, gr2 DD, inflat AK (poor acoustic windows);  b. 07/2016 Echo: EF 25-30%, mild LVH, PASP (pt in Afib).   Complication of anesthesia    wake up w/a start; hallucinations (06/26/2012)   Critical Aortic Stenosis    a. 03/2003 s/p SJM #25 mech prosthetic AoV;  b. 07/2016 Echo: EF 25-30% (in setting of AF), AoV area 1.72 cm^2 (VTI), 1.75 cm^2 (Vmean).   Diverticulosis    Gouty arthritis    have had it in both feet, ankles, right knee (06/26/2012)   History of diverticulitis of colon    History of kidney stones    History of pancreatitis    Hypertension    Incomplete RBBB    PAF (paroxysmal atrial fibrillation) (HCC)     a. CHA2DS2VASc = 4-->chronic coumadin  in setting of mech AoV;  b. 07/2016 Recurrent AF RVR.    Medications:  Medications Prior to Admission  Medication Sig Dispense Refill Last Dose/Taking   acetaminophen  (TYLENOL ) 500 MG tablet Take 500 mg by mouth 2 (two) times daily.   01/01/2024   albuterol  (VENTOLIN  HFA) 108 (90 Base) MCG/ACT inhaler Inhale 2 puffs into the lungs every 4 (four) hours.   01/01/2024   allopurinol  (ZYLOPRIM ) 300 MG tablet Take 150 mg by mouth daily.   01/01/2024   aspirin  EC 81 MG tablet Take 81 mg by mouth daily. Swallow whole.   01/01/2024   clobetasol cream (TEMOVATE) 0.05 % Apply 1 Application topically 2 (two) times daily.   01/01/2024   colchicine  0.6 MG tablet Take 1 tablet (0.6 mg total) by mouth daily as needed (for gout).   Unknown   FARXIGA  10 MG TABS tablet TAKE 1 TABLET BY MOUTH EVERY DAY 30 tablet 6 01/01/2024   FIBER PO Take 2 capsules by mouth 2 (two) times daily.   01/01/2024  folic acid  (FOLVITE ) 1 MG tablet TAKE 1 TABLET(1 MG) BY MOUTH DAILY 90 tablet 3 01/01/2024   metoprolol  succinate (TOPROL -XL) 25 MG 24 hr tablet TAKE 1 TABLET BY MOUTH EVERYDAY AT BEDTIME 90 tablet 1 12/31/2023   potassium chloride  SA (KLOR-CON  M) 20 MEQ tablet Take 1 tablet (20 mEq total) by mouth daily.   01/01/2024   tamsulosin  (FLOMAX ) 0.4 MG CAPS capsule Take 1 capsule (0.4 mg total) by mouth every other day. 90 capsule 3 01/01/2024   tolterodine  (DETROL  LA) 4 MG 24 hr capsule Take 1 capsule (4 mg total) by mouth every other day. Alternating days with the tamsulosin  90 capsule 3 12/31/2023   torsemide  (DEMADEX ) 20 MG tablet Take 3 tablets (60 mg total) by mouth daily.   01/01/2024   warfarin (COUMADIN ) 5 MG tablet Take 0.5-1 tablets (2.5-5 mg total) by mouth See admin instructions. 1/19 and 1/20  take 7.5mg  (1 and 1/2 tab) then take 1 tab (5mg ) daily until seen by MD  Take 1 tablet (5 mg) by mouth on Sundays, Mondays, Wednesdays, Thursdays & Saturdays in the evening. Take 0.5 tablet (2.5 mg) by  mouth on Tuesdays & Fridays in the evening. (Patient taking differently: Take 2.5-5 mg by mouth See admin instructions. AS directed by PCP 2.5 mg Daily) 45 tablet 11 12/31/2023 at  8:00 PM   docusate sodium  (COLACE) 100 MG capsule Take 100 mg by mouth 2 (two) times daily. (Patient not taking: Reported on 01/01/2024)   Not Taking   polyethylene glycol powder (GLYCOLAX /MIRALAX ) 17 GM/SCOOP powder Take 17 g by mouth 2 (two) times daily as needed for moderate constipation or mild constipation. (Patient not taking: Reported on 12/26/2023) 238 g 2    Scheduled:   acetaZOLAMIDE   500 mg Oral BID   allopurinol   150 mg Oral Daily   aspirin  EC  81 mg Oral Daily   Chlorhexidine  Gluconate Cloth  6 each Topical Daily   feeding supplement  237 mL Oral TID WC & HS   fesoterodine   4 mg Oral QODAY   insulin  aspart  0-15 Units Subcutaneous TID WC   midodrine  15 mg Oral Q8H   potassium chloride   40 mEq Oral Daily   tamsulosin   0.4 mg Oral QODAY   torsemide   60 mg Oral BID   warfarin  2.5 mg Oral ONCE-1600   Warfarin - Pharmacist Dosing Inpatient   Does not apply q1600   Infusions:   amiodarone  30 mg/hr (01/24/24 0700)   milrinone  0.25 mcg/kg/min (01/24/24 0700)   PRN: ondansetron  (ZOFRAN ) IV, mouth rinse, traZODone   Assessment: 83 yo male presenting with worsening shortness of breath. On warfarin PTA for afib with mechanical AVR.    Per patient, dose is 5 mg daily, poor historian. Upon chart review patient should be on 2.5 mg once daily on Monday, Tuesday, Thursday, Friday, and Sunday and 1 mg on Wednesday and Saturday per last PCP note on 12/18/2023. Goal INR is 2.5-3.5.   Pertinent DDI's - amio IV (none PTA, 6/12 >20, 7/2 >), PO amio (200 mg 6/20 >6/23, 400 mg 6/23 >7/2), allopurinol  (PTA).   INR today of 2.9 is therapeutic.  Dose held 7/1 in anticipation of RHC / uptrending INR.  CBC stable (Hgb 13.3, pltc 118).  No bleeding noted.  Restarting IV amiodarone  yesterday post RHC (7/2).  Per RN, reduced PO  intake over the past few days (~25% meals) also receiving nutritional supplements (Ensure TID).    Goal of Therapy:  INR 2.5-3.5 Monitor  platelets by anticoagulation protocol: Yes   Plan:  Warfarin 2.5 mg x 1 (PTA regimen) Monitor for s/sx of bleeding, daily INR, CBC Watch for new DDIs and changes in appetite  ADDENDUM - now comfort care.  Warfarin consult/orders removed.  Maurilio Fila, PharmD Clinical Pharmacist 01/24/2024  7:31 AM

## 2024-01-24 NOTE — Progress Notes (Addendum)
 Patient ID: Adrian Lambert, male   DOB: 1941-01-09, 83 y.o.   MRN: [id number]  Advanced Heart Failure Rounding Note  [facility] Cardiologist: Rolan Fuel, MD Chief Complaint: Cardiogenic Shock Subjective:   - Moved to ICU on 6/11 for cardiogenic shock.   - 6/21  lasix  drip increased to 60 mg per hour and  Hypertonic Saline added.  - 6/25 Hypertonic saline stopped. Given 15 mg  tolvaptan .  - 7/1 Hypertonic saline resumed, good UOP - 7/2 RHC: RA mean 14, RV 49/8, PA 53/24, mean 35, PCWP mean 19, Oxygen saturations:, PA 57%, AO 93%, Cardiac Output (Fick) 4.34 , Cardiac Index (Fick) 2.14  PVR 3.7 Wu, Cardiac Output (Thermo) 3.17 , Cardiac Index (Thermo) 1.56, PVR 5.04 Wu, PAPi 2   Good response yesterday again to 3% saline infusion, lasix  gtt and diamox , SCr stable at 2.33, -4.6L, net (-) 2.8L.  HCO3 42 today. Milrinone  increased to 0.25 yesterday post RHC. INR down to 2.9.   SBP remains in 90s on midodrine. CVP <5 today.   Back in a fib/flutter yesterday. Amiodarone  gtt restarted.   Feels stronger today. Lungs sound a lot better. Has been getting up to the chair.   Objective:    Vital Signs:   Temp:  [97.2 F (36.2 C)-98.5 F (36.9 C)] 97.6 F (36.4 C) (07/03 0400) Pulse Rate:  [82-122] 85 (07/03 0645) Resp:  [13-35] 18 (07/03 0630) BP: (82-104)/(59-80) 91/66 (07/03 0630) SpO2:  [79 %-97 %] 90 % (07/03 0645) Weight:  [81.2 kg] 81.2 kg (07/03 0452) Last BM Date : 01/23/24  Weight change: Filed Weights   01/22/24 1442 01/23/24 0411 01/24/24 0452  Weight: 84.5 kg 81.5 kg 81.2 kg   Intake/Output:  Intake/Output Summary (Last 24 hours) at 01/24/2024 0707 Last data filed at 01/24/2024 0700 Gross per 24 hour  Intake 1770.26 ml  Output 4650 ml  Net -2879.74 ml   CVP <5 Physical exam:  General:  elderly / frail appearing.  No respiratory difficulty Neck: supple. JVD ~7 cm.  Cor: PMI nondisplaced. Regular rate & rhythm. No rubs, gallops. 2/6 SEM RUSB. Lungs: clear Extremities: no  cyanosis, clubbing, rash, +1-2 BLE L>R edema  Neuro: alert & oriented x 3.  Affect flat.   Labs: Basic Metabolic Panel: Recent Labs  Lab 01/17/24 1715 01/18/24 0103 01/18/24 1400 01/19/24 0453 01/20/24 0521 01/20/24 1510 01/21/24 0453 01/22/24 0358 01/22/24 1333 01/23/24 0404 01/23/24 9192 01/23/24 0808 01/23/24 0957 01/23/24 1832 01/23/24 1948 01/24/24 0432  NA 127*   < > 127*   < > 129*   < > 131* 129* 135 137 133* 134* 137 130*  --  138  K 4.8   < > 4.1   < > 3.0*   < > 3.8 4.2 3.4* 3.1* 3.3* 3.2* 3.6 >7.5* 3.9 3.4*  CL 74*   < > 76*   < > 74*   < > 79* 80* 81* 79*  --   --  79* 87*  --  79*  CO2 38*   < > 40*   < > 43*   < > 39* 37* 40* 44*  --   --  42* 37*  --  42*  GLUCOSE 93   < > 110*   < > 137*   < > 161* 175* 145* 132*  --   --  183* 187*  --  170*  BUN 67*   < > 66*   < > 76*   < > 79* 89* 92* 96*  --   --  97* 96*  --  103*  CREATININE 1.72*   < > 1.77*   < > 1.97*   < > 2.18* 2.44* 2.34* 2.42*  --   --  2.45* 2.38*  --  2.33*  CALCIUM 9.3   < > 9.3   < > 9.3   < > 9.7 9.7 9.7 10.0  --   --  10.3 9.1  --  10.3  MG  --    < >  --    < > 2.6*  --  2.7* 2.8*  --  2.8*  --   --   --   --   --  2.9*  PHOS 3.3  --  3.2  --   --   --   --   --   --   --   --   --   --   --   --   --    < > = values in this interval not displayed.   Liver Function Tests: Recent Labs  Lab 01/17/24 0928 01/17/24 1715 01/18/24 1400  AST 28  --   --   ALT 20  --   --   ALKPHOS 113  --   --   BILITOT 3.0*  --   --   PROT 5.7*  --   --   ALBUMIN 3.2* 3.2* 3.1*    CBC: Recent Labs  Lab 01/20/24 0521 01/21/24 0453 01/22/24 0358 01/23/24 0404 01/23/24 0807 01/23/24 0808 01/24/24 0432  WBC 15.0* 14.2* 15.5* 14.7*  --   --  15.8*  HGB 13.1 13.4 13.4 12.7* 16.0 15.6 13.3  HCT 41.3 42.1 42.6 40.6 47.0 46.0 42.4  MCV 63.3* 63.8* 63.3* 63.9*  --   --  64.4*  PLT 168 155 160 152  --   --  118*   Medications:    Scheduled Medications:  acetaZOLAMIDE   500 mg Oral BID    allopurinol   150 mg Oral Daily   aspirin  EC  81 mg Oral Daily   Chlorhexidine  Gluconate Cloth  6 each Topical Daily   feeding supplement  237 mL Oral TID WC & HS   fesoterodine   4 mg Oral QODAY   insulin  aspart  0-15 Units Subcutaneous TID WC   midodrine  15 mg Oral Q8H   potassium chloride   40 mEq Oral Daily   tamsulosin   0.4 mg Oral QODAY   Warfarin - Pharmacist Dosing Inpatient   Does not apply q1600   Infusions:  amiodarone  30 mg/hr (01/24/24 0700)   furosemide  (LASIX ) 200 mg in dextrose  5 % 100 mL (2 mg/mL) infusion 30 mg/hr (01/24/24 0700)   milrinone  0.25 mcg/kg/min (01/24/24 0700)   PRN Medications: ondansetron  (ZOFRAN ) IV, mouth rinse, traZODone   Assessment/Plan:  1. Acute on chronic systolic CHF -> cardiogenic shock:  - Coronary angiography in 1/23  mild CAD.  - Echo 01/03/24  EF 25-30% RV mod reduced - Severe diuretic resistance; previously diuresed with lasix  60mg /hr with hypertonic saline during this admission.  - He was given hypertonic again 7/1 and 7/3 with excellent response.  RHC 7/2 showed filling pressures still elevated but not markedly. CVP <5 this morning. Stop lasix  gtt. No more 3% or metolazone  today. Will start Torsemide  60 mg BID. Follow response - Continue midodrine to 15mg  TID, wean as tolerated. SBP still generally in 90s.  - CO was low on RHC 7/2 on milrinone  0.125.  Increased to 0.25mcg/kg/min 7/2. Worry that we do not have a good  end point, ?palliative home inotropes vs hospice/comfort care if we are unable to wean milrinone .  Not a candidate for advanced therapies, would do poorly with dialysis.    - Hold SGLT2i while he is with purewick and minimally active. Can restart when more mobile.   2. Mechanical aortic valve: Continue warfarin and ASA 81. Goal INR 2.5-3.5.  - INR 2.9, discussed with pharmD.   3. Paroxysmal Atrial fibrillation/atrial flutter:  - Admitted 1/18 with atrial fibrillation with RVR and CHF.  EF down to 20-25%.  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   - In cardiogenic shock in setting of recurrent AF.  - Failed DC-CV on 6/13 but went back to NSR.  - EKG yesterday with NSR.  - Appears to be in a fib/flutter this morning.    - Continue amiodarone  gtt 30 mg/hr - Continue coumadin .   4. Ascending aortic aneurysm: 4.3 cm on 4/24 MRA chest.   5. AKI on CKD2: Creatinine 2.14 on admit. Baseline 1.5. Suspect cardio-renal.  - Creatinine 2.33 today, stable.  - see above  6. Hypokalemia: Replace K.    7. Shock liver - likely 2/2 low output/hepatic congestion   8. Hyponatremia - Resolved, Na 138 today.    9. ILD - CT reviewed with P/CCM initially  GOC:  - Continue to mobilize, out of bed with PT/OT.   - Palliative care consulted yesterday. Plan for family discussion today.   Length of Stay: 23   Beckey LITTIE Coe, NP 01/24/2024, 7:07 AM  Advanced Heart Failure Team Pager [phone] (M-F; 7a - 4p). For after hours, see Amion for on call HF MD  CRITICAL CARE Performed by: Beckey LITTIE Coe   Total critical care time: 15 minutes  Critical care time was exclusive of separately billable procedures and treating other patients.  Critical care was necessary to treat or prevent imminent or life-threatening deterioration.  Critical care was time spent personally by me on the following activities: development of treatment plan with patient and/or surrogate as well as nursing, discussions with consultants, evaluation of patient's response to treatment, examination of patient, obtaining history from patient or surrogate, ordering and performing treatments and interventions, ordering and review of laboratory studies, ordering and review of radiographic studies, pulse oximetry and re-evaluation of patient's condition.   Patient seen with NP, I formulated the plan and agree with the above note.   Co-ox 65% today on milrinone  0.25.  Creatinine stable 2.33 but BUN rising.  He appears to be in NSR  today with 1st degree AVB on amiodarone  gtt. Excellent diuresis again with Lasix  gtt + 3% saline. CVP 9 today.   Feels weak.  Did work with PT yesterday.   General: NAD Neck: JVP 8-9 cm, no thyromegaly or thyroid  nodule.  Lungs: Clear to auscultation bilaterally with normal respiratory effort. CV: Nondisplaced PMI.  Heart regular S1/S2, no S3/S4, no murmur. 1+ ankle edema.  Abdomen: Soft, nontender, no hepatosplenomegaly, no distention.  Skin: Intact without lesions or rashes.  Neurologic: Alert and oriented x 3.  Psych: Flat affect. Extremities: No clubbing or cyanosis.  HEENT: Normal.   Stable co-ox, will continue milrinone  0.25 at least until BUN/creatinine stabilize.   CVP down to 9 with rising BUN.  Stop Lasix  gtt, start torsemide  60 mg bid.  Will continue acetazolamide  today with high HCO3.   SBP 90s, continue midodrine.   He remains very weak.  I worry that we do not have a good end point, ?palliative home  inotropes vs hospice/comfort care if we are unable to wean milrinone . Not a candidate for advanced therapies, would do poorly with dialysis. I am going to consult palliative care.  Continue to mobilize and work with PT.   CRITICAL CARE Performed by: Ezra Shuck  Total critical care time: 40 minutes  Critical care time was exclusive of separately billable procedures and treating other patients.  Critical care was necessary to treat or prevent imminent or life-threatening deterioration.  Critical care was time spent personally by me on the following activities: development of treatment plan with patient and/or surrogate as well as nursing, discussions with consultants, evaluation of patient's response to treatment, examination of patient, obtaining history from patient or surrogate, ordering and performing treatments and interventions, ordering and review of laboratory studies, ordering and review of radiographic studies, pulse oximetry and re-evaluation of patient's  condition.  Ezra Shuck 01/24/2024 8:51 AM

## 2024-01-24 NOTE — Progress Notes (Signed)
 Right upper extremity venous  has been completed. Refer to [facility] under chart review to view preliminary results.   01/24/2024  10:42 AM Hilario Robarts, Ricka BIRCH

## 2024-01-24 NOTE — Progress Notes (Signed)
 Inpatient Rehabilitation Admissions Coordinator   Noted plans for residential hospice. We will sign off.  Heron Leavell, RN, MSN Rehab Admissions Coordinator [phone] 01/24/2024 12:06 PM

## 2024-01-24 NOTE — Consult Note (Signed)
 Consultation Note Date: 01/24/2024   Patient Name: Adrian Lambert  DOB: May 30, 1941  MRN: [id number]  Age / Sex: 83 y.o., male  PCP: Okey Carlin Redbird, MD Referring Physician: Rolan Ezra RAMAN, MD  Reason for Consultation: Establishing goals of care  HPI/Patient Profile: 83 y.o. male   admitted on 01/01/2024 with past medical history significant for HFrEF, mechanical AVR MP AF on warfarin, ILD, BPH and HTN who presented to the ED at [facility] 6/10 for complaints of shortness of breath.  Workup was concerning for HFrEF exacerbation patient was admitted for treatment and stabilization  Patient currently in the ICU, continues with inotrope and vasopressor support.  Today is day 23 of this hospitalization,  patient with poor response to full medical support.  Patient and family face treatment option decisions, advanced directive decisions and anticipatory care needs.  Clinical Assessment and Goals of Care:  This NP Ronal Plants reviewed medical records, received report from team, assessed the patient and then meet at the patient's bedside along with his wife, son/and daughter Sari to discuss diagnosis, prognosis, GOC, EOL wishes disposition and options.   Concept of Palliative Care was introduced as specialized medical care for people and their families living with serious illness.  If focuses on providing relief from the symptoms and stress of a serious illness.  The goal is to improve quality of life for both the patient and the family.  Values and goals of care important to patient and family were attempted to be elicited.  Education offered on patient's current medical situation specific to his serious multiple comorbidities ; cardiac disease; acute on chronic heart failure/EF 25 to 30%, A-fib, AAA, acute kidney injury on CKD/creatinine 2.33 today, hyponatremia, ILD, debility and overall failure to  thrive.  A  discussion was had today regarding advanced directives.  Concepts specific to code status, artifical feeding and hydration, continued IV antibiotics and rehospitalization was had.    The difference between a aggressive medical intervention path  and a palliative comfort care path for this patient at this time was had.   Created space and opportunity for patient  and family to explore thoughts and feelings regarding current medical situation Patient is too weak today to actively participate in conversation, he is good with conversation to be had with his family.    All family members present verbalize a understanding of patient's current medical situation and associated long-term poor prognosis..  All agree that comfort and dignity are a priority for the patient, all expressed desire to avoid suffering.   Education offered on hospice benefit; philosophy and eligibility.  Specific education offered regarding inpatient hospice facility eligibility.       Family expressed a hope for an inpatient hospice facility for end-of-life care for Mr. Fisherman.  They expressed an interest in [facility] [place] in [place].  I believe patient is eligible for inpatient hospice services, and will decline quite rapidly after current supportive interventions are stopped at discharge.  Natural trajectory and expectations at EOL were discussed.   Plan of care - DNR/DNI - No artificial feeding or hydration now or in the future -Continue current supportive measures until bed can be secured at an inpatient hospice unit----continue milrinone  until discharge - No escalation of care -If patient decompensates before transition to hospice-transition to comfort approach - Referral made to [facility]  (hospice aware and will contact TOC when a bed becomes available) - PMT will continue to support holistically  MOST form introduced and a Hard Choices was left for review     Questions and concerns addressed. Family  encouraged to call with questions or concerns.       Patient's wife/is H POA        SUMMARY OF RECOMMENDATIONS    Code Status/Advance Care Planning: DNR-discussed with family in detail and documented today   Symptom Management:  Per attending   Palliative Prophylaxis:  Aspiration, Bowel Regimen, Delirium Protocol, and Frequent Pain Assessment    Psycho-social/Spiritual:  Desire for further Chaplaincy support: no, family has  strong community church support Additional Recommendations: Education on Hospice  Prognosis:  < 2 weeks  Discharge Planning: Hospice facility      Primary Diagnoses: Present on Admission: **None**   I have reviewed the medical record, interviewed the patient and family, and examined the patient. The following aspects are pertinent.  Past Medical History:  Diagnosis Date   Anticoagulant long-term use    Arthritis    probably in my thumbs (06/26/2012)   Bifascicular block    Chronic combined systolic and diastolic CHF (congestive heart failure) (HCC)    a. 01/2016 Echo: EF 45-50%, gr2 DD, inflat AK (poor acoustic windows);  b. 07/2016 Echo: EF 25-30%, mild LVH, PASP (pt in Afib).   Complication of anesthesia    wake up w/a start; hallucinations (06/26/2012)   Critical Aortic Stenosis    a. 03/2003 s/p SJM #25 mech prosthetic AoV;  b. 07/2016 Echo: EF 25-30% (in setting of AF), AoV area 1.72 cm^2 (VTI), 1.75 cm^2 (Vmean).   Diverticulosis    Gouty arthritis    have had it in both feet, ankles, right knee (06/26/2012)   History of diverticulitis of colon    History of kidney stones    History of pancreatitis    Hypertension    Incomplete RBBB    PAF (paroxysmal atrial fibrillation) (HCC)    a. CHA2DS2VASc = 4-->chronic coumadin  in setting of mech AoV;  b. 07/2016 Recurrent AF RVR.   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    Comment: retired/[employer]  Tobacco Use   Smoking status: Never   Smokeless tobacco: Never  Vaping Use   Vaping status: Never Used  Substance and Sexual Activity   Alcohol use: Yes    Alcohol/week: 3.0 standard drinks of alcohol    Types: 3 Cans of beer per week    Comment:  may have 2-3 beers on the weekend   Drug use: No   Sexual activity: Not on file  Other Topics Concern   Not on file  Social History Narrative   Not on file   Social Drivers of Health   Financial Resource Strain: Not on file  Food Insecurity: No Food Insecurity (  01/02/2024)   Hunger Vital Sign    Worried About Running Out of Food in the Last Year: Never true    Ran Out of Food in the Last Year: Never true  Transportation Needs: No Transportation Needs (01/02/2024)   PRAPARE - Administrator, Civil Service (Medical): No    Lack of Transportation (Non-Medical): No  Physical Activity: Not on file  Stress: Not on file  Social Connections: Moderately Isolated (01/02/2024)   Social Connection and Isolation Panel    Frequency of Communication with Friends and Family: More than three times a week    Frequency of Social Gatherings with Friends and Family: Three times a week    Attends Religious Services: Never    Active Member of Clubs or Organizations: No    Attends Engineer, structural: Never    Marital Status: Married   Family History  Problem Relation Age of Onset   Heart failure Mother    Diabetes Mother    Prostate cancer Father    Scheduled Meds:  acetaZOLAMIDE   500 mg Oral BID   allopurinol   150 mg Oral Daily   aspirin  EC  81 mg Oral Daily   Chlorhexidine  Gluconate Cloth  6 each Topical Daily   feeding supplement  237 mL Oral TID WC & HS   fesoterodine   4 mg Oral QODAY   insulin  aspart  0-15 Units Subcutaneous TID WC   midodrine  15 mg Oral  Q8H   potassium chloride   40 mEq Oral Daily   tamsulosin   0.4 mg Oral QODAY   torsemide   60 mg Oral BID   warfarin  2.5 mg Oral ONCE-1600   Warfarin - Pharmacist Dosing Inpatient   Does not apply q1600   Continuous Infusions:  amiodarone  30 mg/hr (01/24/24 0700)   milrinone  0.25 mcg/kg/min (01/24/24 0700)   PRN Meds:.ondansetron  (ZOFRAN ) IV, mouth rinse, traZODone  Medications Prior to Admission:  Prior to Admission medications   Medication Sig Start Date End Date Taking? Authorizing Provider  acetaminophen  (TYLENOL ) 500 MG tablet Take 500 mg by mouth 2 (two) times daily.   Yes [provider]  albuterol  (VENTOLIN  HFA) 108 (90 Base) MCG/ACT inhaler Inhale 2 puffs into the lungs every 4 (four) hours.   Yes [provider]  allopurinol  (ZYLOPRIM ) 300 MG tablet Take 150 mg by mouth daily.   Yes [provider]  aspirin  EC 81 MG tablet Take 81 mg by mouth daily. Swallow whole.   Yes [provider]  clobetasol cream (TEMOVATE) 0.05 % Apply 1 Application topically 2 (two) times daily.   Yes [provider]  colchicine  0.6 MG tablet Take 1 tablet (0.6 mg total) by mouth daily as needed (for gout). 10/19/22  Yes Gonfa, Taye T, MD  FARXIGA  10 MG TABS tablet TAKE 1 TABLET BY MOUTH EVERY DAY 12/18/23  Yes McLean, Dalton S, MD  FIBER PO Take 2 capsules by mouth 2 (two) times daily.   Yes [provider]  folic acid  (FOLVITE ) 1 MG tablet TAKE 1 TABLET(1 MG) BY MOUTH DAILY 01/07/21  Yes Ennever, Peter R, MD  metoprolol  succinate (TOPROL -XL) 25 MG 24 hr tablet TAKE 1 TABLET BY MOUTH EVERYDAY AT BEDTIME 07/16/23  Yes McLean, Dalton S, MD  potassium chloride  SA (KLOR-CON  M) 20 MEQ tablet Take 1 tablet (20 mEq total) by mouth daily. 12/27/23  Yes Clegg, Amy D, NP  tamsulosin  (FLOMAX ) 0.4 MG CAPS capsule Take 1 capsule (0.4 mg total) by mouth every other day.  07/05/23  Yes Stoneking, Adine PARAS., MD  tolterodine  (DETROL  LA) 4 MG 24 hr capsule Take 1 capsule (4  mg total) by mouth every other day. Alternating days with the tamsulosin  07/05/23  Yes Stoneking, Adine PARAS., MD  torsemide  (DEMADEX ) 20 MG tablet Take 3 tablets (60 mg total) by mouth daily. 12/27/23  Yes Clegg, Amy D, NP  warfarin (COUMADIN ) 5 MG tablet Take 0.5-1 tablets (2.5-5 mg total) by mouth See admin instructions. 1/19 and 1/20  take 7.5mg  (1 and 1/2 tab) then take 1 tab (5mg ) daily until seen by MD  Take 1 tablet (5 mg) by mouth on Sundays, Mondays, Wednesdays, Thursdays & Saturdays in the evening. Take 0.5 tablet (2.5 mg) by mouth on Tuesdays & Fridays in the evening. Patient taking differently: Take 2.5-5 mg by mouth See admin instructions. AS directed by PCP 2.5 mg Daily 08/11/21  Yes McLean, Dalton S, MD  docusate sodium  (COLACE) 100 MG capsule Take 100 mg by mouth 2 (two) times daily. Patient not taking: Reported on 01/01/2024    [provider]  polyethylene glycol powder (GLYCOLAX /MIRALAX ) 17 GM/SCOOP powder Take 17 g by mouth 2 (two) times daily as needed for moderate constipation or mild constipation. Patient not taking: Reported on 12/26/2023 10/19/22   Gonfa, Taye T, MD   Allergies  Allergen Reactions   Celecoxib Rash    Other Reaction(s): Unknown   Review of Systems  Unable to perform ROS: Acuity of condition    Physical Exam Constitutional:      Appearance: He is underweight. He is ill-appearing.     Interventions: Nasal cannula in place.  Cardiovascular:     Rate and Rhythm: Normal rate.  Pulmonary:     Effort: Pulmonary effort is normal.  Musculoskeletal:     Comments: Generalized weakness and muscle atrophy  Skin:    General: Skin is warm and dry.  Neurological:     Mental Status: He is lethargic.     Vital Signs: BP 91/66   Pulse 85   Temp 97.6 F (36.4 C) (Oral)   Resp 18   Ht 5' 10 (1.778 m)   Wt 81.2 kg   SpO2 90%   BMI 25.69 kg/m  Pain Scale: 0-10 POSS *See Group Information*: 2-Acceptable,Slightly drowsy, easily aroused Pain Score:  0-No pain   SpO2: SpO2: 90 % O2 Device:SpO2: 90 % O2 Flow Rate: .O2 Flow Rate (L/min): 2 L/min  IO: Intake/output summary:  Intake/Output Summary (Last 24 hours) at 01/24/2024 0748 Last data filed at 01/24/2024 0700 Gross per 24 hour  Intake 1770.26 ml  Output 4650 ml  Net -2879.74 ml    LBM: Last BM Date : 01/23/24 Baseline Weight: Weight: 82.5 kg Most recent weight: Weight: 81.2 kg     Palliative Assessment/Data: 30 % at best     Time: 75 minutes  Discussed with attending team and bedside RN   Signed by: Ronal Plants, NP   Please contact Palliative Medicine Team phone at [phone] for questions and concerns.  For individual provider: See Tracey

## 2024-01-24 NOTE — TOC Progression Note (Signed)
 Transition of Care [facility]) - Progression Note    Patient Details  Name: Adrian Lambert MRN: [id number] Date of Birth: 1941/01/30  [facility]) CM/SW Contact  Justina Delcia Czar, RN Phone Number: [phone] 01/24/2024, 11:22 AM  Clinical Narrative:     Spoke to pt, wife, dtr, Sari and son, [place] at bedside. Offered choice for [facility]idential [facility]ice. Family is requesting [facility] [place]. Contacted rep, Cheri and currently they do not have a bed today. She will keep CM updated when a bed becomes available.  Will need PTAR transport.  Medicare.gov listing with rating placed on chart. DNR is on chart.   Expected Discharge Plan: Hospice Medical Facility Barriers to Discharge: Continued Medical Work up  Expected Discharge Plan and Services   Discharge Planning Services: CM Consult Post Acute Care Choice: Residential Hospice Bed Living arrangements for the past 2 months: Single Family Home                                       Social Determinants of Health (SDOH) Interventions SDOH Screenings   Food Insecurity: No Food Insecurity (01/02/2024)  Housing: Low Risk  (01/02/2024)  Transportation Needs: No Transportation Needs (01/02/2024)  Utilities: Not At Risk (01/02/2024)  Social Connections: Moderately Isolated (01/02/2024)  Tobacco Use: Low Risk  (01/02/2024)    Readmission Risk Interventions     No data to display

## 2024-01-24 NOTE — Progress Notes (Signed)
 OT Cancellation Note  Patient Details Name: Adrian Lambert MRN: [id number] DOB: 07/12/1941   Cancelled Treatment:    Reason Eval/Treat Not Completed: Medical issues which prohibited therapy.  Spoke with primary RN, advised to hold for today, can check back next date after palliative consult and discussion of POC.    Darrie Macmillan D Farryn Linares 01/24/2024, 8:34 AM 01/24/2024  RP, OTR/L  Acute Rehabilitation Services  Office:  [phone]

## 2024-01-25 ENCOUNTER — Inpatient Hospital Stay (HOSPITAL_COMMUNITY)

## 2024-01-25 DIAGNOSIS — I5023 Acute on chronic systolic (congestive) heart failure: Secondary | ICD-10-CM | POA: Diagnosis not present

## 2024-01-25 DIAGNOSIS — Z515 Encounter for palliative care: Secondary | ICD-10-CM | POA: Diagnosis not present

## 2024-01-25 DIAGNOSIS — R0989 Other specified symptoms and signs involving the circulatory and respiratory systems: Secondary | ICD-10-CM | POA: Diagnosis not present

## 2024-01-25 DIAGNOSIS — R570 Cardiogenic shock: Secondary | ICD-10-CM | POA: Diagnosis not present

## 2024-01-25 DIAGNOSIS — I5043 Acute on chronic combined systolic (congestive) and diastolic (congestive) heart failure: Secondary | ICD-10-CM | POA: Diagnosis not present

## 2024-01-25 LAB — CBC
HCT: 42.5 % (ref 39.0–52.0)
Hemoglobin: 13.4 g/dL (ref 13.0–17.0)
MCH: 20.5 pg — ABNORMAL LOW (ref 26.0–34.0)
MCHC: 31.5 g/dL (ref 30.0–36.0)
MCV: 65 fL — ABNORMAL LOW (ref 80.0–100.0)
Platelets: 104 K/uL — ABNORMAL LOW (ref 150–400)
RBC: 6.54 MIL/uL — ABNORMAL HIGH (ref 4.22–5.81)
RDW: 20.1 % — ABNORMAL HIGH (ref 11.5–15.5)
WBC: 17.5 K/uL — ABNORMAL HIGH (ref 4.0–10.5)
nRBC: 1.9 % — ABNORMAL HIGH (ref 0.0–0.2)

## 2024-01-25 LAB — PROTIME-INR
INR: 2.9 — ABNORMAL HIGH (ref 0.8–1.2)
Prothrombin Time: 31.7 s — ABNORMAL HIGH (ref 11.4–15.2)

## 2024-01-25 LAB — BASIC METABOLIC PANEL WITH GFR
Anion gap: 17 — ABNORMAL HIGH (ref 5–15)
BUN: 103 mg/dL — ABNORMAL HIGH (ref 8–23)
BUN: 103 mg/dL — ABNORMAL HIGH (ref 8–23)
CO2: 45 mmol/L — ABNORMAL HIGH (ref 22–32)
CO2: >45 mmol/L — ABNORMAL HIGH (ref 22–32)
Calcium: 10.5 mg/dL — ABNORMAL HIGH (ref 8.9–10.3)
Calcium: 10.4 mg/dL — ABNORMAL HIGH (ref 8.9–10.3)
Chloride: 77 mmol/L — ABNORMAL LOW (ref 98–111)
Chloride: 77 mmol/L — ABNORMAL LOW (ref 98–111)
Creatinine, Ser: 2.28 mg/dL — ABNORMAL HIGH (ref 0.61–1.24)
Creatinine, Ser: 2.14 mg/dL — ABNORMAL HIGH (ref 0.61–1.24)
GFR, Estimated: 28 mL/min — ABNORMAL LOW (ref >60)
GFR, Estimated: 30 mL/min — ABNORMAL LOW (ref >60)
Glucose, Bld: 170 mg/dL — ABNORMAL HIGH (ref 70–99)
Glucose, Bld: 124 mg/dL — ABNORMAL HIGH (ref 70–99)
Potassium: 2.8 mmol/L — ABNORMAL LOW (ref 3.5–5.1)
Potassium: 2.8 mmol/L — ABNORMAL LOW (ref 3.5–5.1)
Sodium: 139 mmol/L (ref 135–145)
Sodium: 140 mmol/L (ref 135–145)

## 2024-01-25 LAB — GLUCOSE, CAPILLARY
Glucose-Capillary: 148 mg/dL — ABNORMAL HIGH (ref 70–99)
Glucose-Capillary: 201 mg/dL — ABNORMAL HIGH (ref 70–99)

## 2024-01-25 MED ORDER — ONDANSETRON 4 MG PO TBDP: 4.0000 mg | ORAL_TABLET | Freq: Four times a day (QID) | ORAL | Status: DC | PRN

## 2024-01-25 MED ORDER — ACETAMINOPHEN 325 MG PO TABS: 650.0000 mg | ORAL_TABLET | Freq: Four times a day (QID) | ORAL | Status: DC | PRN

## 2024-01-25 MED ORDER — WARFARIN SODIUM 2.5 MG PO TABS: 2.5000 mg | ORAL_TABLET | Freq: Once | ORAL | Status: DC

## 2024-01-25 MED ORDER — SODIUM CHLORIDE 0.9 % IV SOLN: INTRAVENOUS | Status: AC

## 2024-01-25 MED ORDER — POTASSIUM CHLORIDE CRYS ER 20 MEQ PO TBCR
40.0000 meq | EXTENDED_RELEASE_TABLET | ORAL | Status: AC
  Administered 2024-01-25 (×2): 40 meq via ORAL
  Filled 2024-01-25 (×2): qty 2

## 2024-01-25 MED ORDER — GLYCOPYRROLATE 0.2 MG/ML IJ SOLN: 0.2000 mg | INTRAMUSCULAR | Status: DC | PRN

## 2024-01-25 MED ORDER — GLYCOPYRROLATE 1 MG PO TABS: 1.0000 mg | ORAL_TABLET | ORAL | Status: DC | PRN

## 2024-01-25 MED ORDER — ONDANSETRON HCL 4 MG/2ML IJ SOLN: 4.0000 mg | Freq: Four times a day (QID) | INTRAMUSCULAR | Status: DC | PRN

## 2024-01-25 MED ORDER — GLYCOPYRROLATE 0.2 MG/ML IJ SOLN
0.2000 mg | INTRAMUSCULAR | Status: DC | PRN
  Administered 2024-01-25: 0.2 mg via INTRAVENOUS
  Filled 2024-01-25: qty 1

## 2024-01-25 MED ORDER — POTASSIUM CHLORIDE 10 MEQ/50ML IV SOLN
10.0000 meq | INTRAVENOUS | Status: AC
  Administered 2024-01-25 (×3): 10 meq via INTRAVENOUS
  Filled 2024-01-25 (×4): qty 50

## 2024-01-25 MED ORDER — LORAZEPAM 2 MG/ML IJ SOLN
2.0000 mg | INTRAMUSCULAR | Status: DC | PRN
  Administered 2024-01-25 – 2024-01-28 (×3): 2 mg via INTRAVENOUS
  Filled 2024-01-25 (×3): qty 1

## 2024-01-25 MED ORDER — SCOPOLAMINE 1 MG/3DAYS TD PT72
1.0000 | MEDICATED_PATCH | TRANSDERMAL | Status: DC
  Administered 2024-01-25 – 2024-01-28 (×2): 1.5 mg via TRANSDERMAL
  Filled 2024-01-25 (×2): qty 1

## 2024-01-25 MED ORDER — WARFARIN - PHARMACIST DOSING INPATIENT: Freq: Every day | Status: DC

## 2024-01-25 MED ORDER — GLYCOPYRROLATE 0.2 MG/ML IJ SOLN
0.6000 mg | INTRAMUSCULAR | Status: DC | PRN
  Administered 2024-01-25 – 2024-01-28 (×13): 0.6 mg via INTRAVENOUS
  Filled 2024-01-25 (×13): qty 3

## 2024-01-25 MED ORDER — POLYVINYL ALCOHOL 1.4 % OP SOLN: 1.0000 [drp] | Freq: Four times a day (QID) | OPHTHALMIC | Status: DC | PRN

## 2024-01-25 MED ORDER — ACETAMINOPHEN 650 MG RE SUPP: 650.0000 mg | Freq: Four times a day (QID) | RECTAL | Status: DC | PRN

## 2024-01-25 MED ORDER — FUROSEMIDE 10 MG/ML IJ SOLN: 80.0000 mg | Freq: Once | INTRAMUSCULAR | Status: DC

## 2024-01-25 MED ORDER — POTASSIUM CHLORIDE CRYS ER 20 MEQ PO TBCR
40.0000 meq | EXTENDED_RELEASE_TABLET | Freq: Three times a day (TID) | ORAL | Status: DC
  Administered 2024-01-25: 40 meq via ORAL
  Filled 2024-01-25: qty 2

## 2024-01-25 NOTE — Progress Notes (Signed)
 Palliative Medicine Progress Note   Patient Name: Adrian Lambert       Date: 01/25/2024 DOB: [birth year]  Age: 83 y.o. MRN#: [id number] Attending Physician: Rolan Ezra RAMAN, MD Primary Care Physician: Okey Carlin Redbird, MD Admit Date: 01/01/2024  Reason for Consultation/Follow-up: {Reason for Consult:23484}  HPI/Patient Profile: 83 y.o. male   admitted on 01/01/2024 with past medical history significant for HFrEF, mechanical AVR MP AF on warfarin, ILD, BPH and HTN who presented to the ED at [facility] 6/10 for complaints of shortness of breath.  Workup was concerning for HFrEF exacerbation patient was admitted for treatment and stabilization   Patient currently in the ICU, continues with inotrope and vasopressor support.   Today is day 24 of this hospitalization,  patient with poor response to full medical support.   Patient and family face treatment option decisions, advanced directive decisions and anticipatory care needs.  Subjective: Chart reviewed. Update received from RN. Note Milrinone  infusion was stopped earlier today as ordered by HF team.   Patient assessed at bedside.   Objective:  Physical Exam Vitals reviewed.  Constitutional:      General: He is sleeping. He is not in acute distress.    Appearance: He is ill-appearing.  Pulmonary:     Effort: No respiratory distress.     Comments: Respiratory congestion             Vital Signs: BP (!) 84/68   Pulse 84   Temp (!) 97.1 F (36.2 C) (Oral)   Resp (!) 21   Ht 5' 10 (1.778 m)   Wt 81.6 kg   SpO2 95%   BMI 25.81 kg/m  SpO2: SpO2: 95 % O2 Device: O2 Device: Nasal Cannula O2 Flow Rate: O2 Flow Rate (L/min): 4 L/min   Palliative Medicine Assessment & Plan   Assessment: Principal Problem:   Cardiogenic shock  (HCC) Active Problems:   Acute on chronic heart failure (HCC)   Acute CHF (congestive heart failure) (HCC)   Atrial flutter (HCC)   Pressure injury of skin    Recommendations/Plan: Continue comfort-focused care  Goals of Care and Additional Recommendations: Limitations on Scope of Treatment: {Recommended Scope and Preferences:21019}  Code Status:   Prognosis:  {Palliative Care Prognosis:23504}  Discharge Planning: {Palliative dispostion:23505}  Care plan was discussed with ***  Thank you  for allowing the Palliative Medicine Team to assist in the care of this patient.   ***   Recardo KATHEE Loll, NP   Please contact Palliative Medicine Team phone at [phone] for questions and concerns.  For individual providers, please see AMION.

## 2024-01-25 NOTE — TOC CM/SW Note (Signed)
 Notified by [facility] liaison- Magdalena Berber- no beds available today [facility]. She will continue to follow and alert team when bed available.

## 2024-01-25 NOTE — Progress Notes (Signed)
 PT Cancellation Note  Patient Details Name: ALEJO BEAMER MRN: [id number] DOB: 07/19/1941   Cancelled Treatment:    Reason Eval/Treat Not Completed: Medical issues which prohibited therapy (sign off per family request as pt is Hospice care.)   [name] 01/25/2024, 2:57 PM Medina Degraffenreid M,PT Acute Rehab Services [phone]

## 2024-01-25 NOTE — Progress Notes (Signed)
 Physical Therapy Discharge Patient Details Name: Adrian Lambert MRN: [id number] DOB: 11-11-1940 Today's Date: 01/25/2024 Time:  -     Patient discharged from PT services secondary to medical decline - will need to re-order PT to resume therapy services.  Please see latest therapy progress note for current level of functioning and progress toward goals.    Progress and discharge plan discussed with patient and/or caregiver: Patient/Caregiver agrees with plan  GP     [name] 01/25/2024, 2:58 PM  [name] M,PT Acute Rehab Services [phone]

## 2024-01-25 NOTE — Progress Notes (Signed)
 ANTICOAGULATION CONSULT NOTE  Pharmacy Consult for Warfarin Indication: afib with mechanical AVR  Allergies  Allergen Reactions   Celecoxib Rash    Other Reaction(s): Unknown    Patient Measurements: Height: 5' 10 (177.8 cm) Weight: 81.6 kg (179 lb 14.3 oz) IBW/kg (Calculated) : 73  Vital Signs: Temp: 97 F (36.1 C) (07/04 0818) Temp Source: Axillary (07/04 0818) BP: 95/68 (07/04 0800) Pulse Rate: 113 (07/04 0800)  Labs: Recent Labs    01/23/24 0404 01/23/24 0807 01/23/24 0808 01/23/24 0957 01/23/24 1832 01/24/24 0432 01/25/24 0403  HGB 12.7*   < > 15.6  --   --  13.3 13.4  HCT 40.6   < > 46.0  --   --  42.4 42.5  PLT 152  --   --   --   --  118* 104*  LABPROT 35.4*  --   --   --   --  31.7*  --   INR 3.3*  --   --   --   --  2.9*  --   CREATININE 2.42*  --   --    < > 2.38* 2.33* 2.28*   < > = values in this interval not displayed.    Estimated Creatinine Clearance: 25.8 mL/min (A) (by C-G formula based on SCr of 2.28 mg/dL (H)).   Medical History: Past Medical History:  Diagnosis Date   Anticoagulant long-term use    Arthritis    probably in my thumbs (06/26/2012)   Bifascicular block    Chronic combined systolic and diastolic CHF (congestive heart failure) (HCC)    a. 01/2016 Echo: EF 45-50%, gr2 DD, inflat AK (poor acoustic windows);  b. 07/2016 Echo: EF 25-30%, mild LVH, PASP (pt in Afib).   Complication of anesthesia    wake up w/a start; hallucinations (06/26/2012)   Critical Aortic Stenosis    a. 03/2003 s/p SJM #25 mech prosthetic AoV;  b. 07/2016 Echo: EF 25-30% (in setting of AF), AoV area 1.72 cm^2 (VTI), 1.75 cm^2 (Vmean).   Diverticulosis    Gouty arthritis    have had it in both feet, ankles, right knee (06/26/2012)   History of diverticulitis of colon    History of kidney stones    History of pancreatitis    Hypertension    Incomplete RBBB    PAF (paroxysmal atrial fibrillation) (HCC)    a. CHA2DS2VASc = 4-->chronic coumadin  in  setting of mech AoV;  b. 07/2016 Recurrent AF RVR.    Medications:  Medications Prior to Admission  Medication Sig Dispense Refill Last Dose/Taking   acetaminophen  (TYLENOL ) 500 MG tablet Take 500 mg by mouth 2 (two) times daily.   01/01/2024   albuterol  (VENTOLIN  HFA) 108 (90 Base) MCG/ACT inhaler Inhale 2 puffs into the lungs every 4 (four) hours.   01/01/2024   allopurinol  (ZYLOPRIM ) 300 MG tablet Take 150 mg by mouth daily.   01/01/2024   aspirin  EC 81 MG tablet Take 81 mg by mouth daily. Swallow whole.   01/01/2024   clobetasol cream (TEMOVATE) 0.05 % Apply 1 Application topically 2 (two) times daily.   01/01/2024   colchicine  0.6 MG tablet Take 1 tablet (0.6 mg total) by mouth daily as needed (for gout).   Unknown   FARXIGA  10 MG TABS tablet TAKE 1 TABLET BY MOUTH EVERY DAY 30 tablet 6 01/01/2024   FIBER PO Take 2 capsules by mouth 2 (two) times daily.   01/01/2024   folic acid  (FOLVITE ) 1 MG tablet TAKE  1 TABLET(1 MG) BY MOUTH DAILY 90 tablet 3 01/01/2024   metoprolol  succinate (TOPROL -XL) 25 MG 24 hr tablet TAKE 1 TABLET BY MOUTH EVERYDAY AT BEDTIME 90 tablet 1 12/31/2023   potassium chloride  SA (KLOR-CON  M) 20 MEQ tablet Take 1 tablet (20 mEq total) by mouth daily.   01/01/2024   tamsulosin  (FLOMAX ) 0.4 MG CAPS capsule Take 1 capsule (0.4 mg total) by mouth every other day. 90 capsule 3 01/01/2024   tolterodine  (DETROL  LA) 4 MG 24 hr capsule Take 1 capsule (4 mg total) by mouth every other day. Alternating days with the tamsulosin  90 capsule 3 12/31/2023   torsemide  (DEMADEX ) 20 MG tablet Take 3 tablets (60 mg total) by mouth daily.   01/01/2024   warfarin (COUMADIN ) 5 MG tablet Take 0.5-1 tablets (2.5-5 mg total) by mouth See admin instructions. 1/19 and 1/20  take 7.5mg  (1 and 1/2 tab) then take 1 tab (5mg ) daily until seen by MD  Take 1 tablet (5 mg) by mouth on Sundays, Mondays, Wednesdays, Thursdays & Saturdays in the evening. Take 0.5 tablet (2.5 mg) by mouth on Tuesdays & Fridays in the  evening. (Patient taking differently: Take 2.5-5 mg by mouth See admin instructions. AS directed by PCP 2.5 mg Daily) 45 tablet 11 12/31/2023 at  8:00 PM   docusate sodium  (COLACE) 100 MG capsule Take 100 mg by mouth 2 (two) times daily. (Patient not taking: Reported on 01/01/2024)   Not Taking   polyethylene glycol powder (GLYCOLAX /MIRALAX ) 17 GM/SCOOP powder Take 17 g by mouth 2 (two) times daily as needed for moderate constipation or mild constipation. (Patient not taking: Reported on 12/26/2023) 238 g 2    Scheduled:   acetaZOLAMIDE   500 mg Oral BID   allopurinol   150 mg Oral Daily   aspirin  EC  81 mg Oral Daily   Chlorhexidine  Gluconate Cloth  6 each Topical Daily   feeding supplement  237 mL Oral TID WC & HS   fesoterodine   4 mg Oral QODAY   insulin  aspart  0-15 Units Subcutaneous TID WC   midodrine   15 mg Oral Q8H   potassium chloride   40 mEq Oral TID   tamsulosin   0.4 mg Oral QODAY   torsemide   60 mg Oral BID   Infusions:    PRN: fentaNYL  (SUBLIMAZE ) injection, ondansetron  (ZOFRAN ) IV, mouth rinse, traZODone   Assessment: 83 yo male presenting with worsening shortness of breath. On warfarin PTA for afib with mechanical AVR.    Per patient, dose is 5 mg daily, poor historian. Upon chart review patient should be on 1 mg Wed/Sat, 2.5 mg all other days per last PCP note on 12/18/2023. Goal INR is 2.5-3.5.   Pertinent DDI's - amio IV (none PTA, 6/12 >20, 7/2 >7/4), PO amio (200 mg 6/20 >6/23, 400 mg 6/23 >7/2), allopurinol  (PTA).   INR 2.9 today is therapeutic.  Dose held 7/1 in anticipation of RHC / uptrending INR and did not receive dose 7/3.  CBC stable (Hgb 13.4, pltc 104).  No bleeding noted.  IV amiodarone  restarted 7/2 post RHC, now stopping all together since going comfort care.  Plan to continue warfarin until discharge. Still with reduced oral intake over the past few days (~25% meals), receiving nutritional supplements (Ensure TID).    Goal of Therapy:  INR 2.5-3.5 Monitor  platelets by anticoagulation protocol: Yes   Plan:  Warfarin 2.5 mg x 1 (PTA regimen) Monitor for s/sx of bleeding, daily INR, CBC Watch for new DDIs and changes in  appetite  Maurilio Fila, PharmD Clinical Pharmacist 01/25/2024  9:11 AM

## 2024-01-25 NOTE — Progress Notes (Signed)
 eLink Physician-Brief Progress Note Patient Name: Adrian Lambert DOB: [birth year] MRN: [id number]   Date of Service  01/25/2024  HPI/Events of Note  Hypokalemic despite scheduled potassium  eICU Interventions  Additional potassium ordered     Intervention Category Minor Interventions: Electrolytes abnormality - evaluation and management  Talasia Saulter 01/25/2024, 4:53 AM

## 2024-01-25 NOTE — Progress Notes (Addendum)
 Patient ID: Adrian Lambert, male   DOB: 08-Aug-1940, 83 y.o.   MRN: [id number]  Advanced Heart Failure Rounding Note  AHF Cardiologist: Rolan Fuel, MD Chief Complaint: Cough  Subjective:   - Moved to ICU on 6/11 for cardiogenic shock.   - 6/21  lasix  drip increased to 60 mg per hour and  Hypertonic Saline added.  - 6/25 Hypertonic saline stopped. Given 15 mg  tolvaptan .  - 7/1 Hypertonic saline resumed, good UOP - 7/2 RHC: RA mean 14, RV 49/8, PA 53/24, mean 35, PCWP mean 19, Oxygen saturations:, PA 57%, AO 93%, Cardiac Output (Fick) 4.34 , Cardiac Index (Fick) 2.14  PVR 3.7 Wu, Cardiac Output (Thermo) 3.17 , Cardiac Index (Thermo) 1.56, PVR 5.04 Alena, PAPi 2  -7/3- Palliative Care consulted. Plan for residential care.   Complaining of cough.   Objective:    Vital Signs:   Temp:  [97 F (36.1 C)-98.7 F (37.1 C)] 97 F (36.1 C) (07/04 0818) Pulse Rate:  [80-195] 82 (07/04 0700) Resp:  [15-43] 20 (07/04 0700) BP: (77-102)/(52-83) 87/66 (07/04 0630) SpO2:  [86 %-96 %] 94 % (07/04 0700) Weight:  [81.6 kg] 81.6 kg (07/04 0405) Last BM Date : 01/23/24  Weight change: Filed Weights   01/23/24 0411 01/24/24 0452 01/25/24 0405  Weight: 81.5 kg 81.2 kg 81.6 kg   Intake/Output:  Intake/Output Summary (Last 24 hours) at 01/25/2024 0858 Last data filed at 01/25/2024 0700 Gross per 24 hour  Intake 1120.38 ml  Output 4175 ml  Net -3054.62 ml  CVP 4-5  General:   Appears frail/weak Neck: supple. no JVD.  Cor: PMI nondisplaced. Regular rate & rhythm. No rubs, gallops or murmurs. Lungs: Crackles.  Abdomen: soft, nontender, nondistended.  Extremities: no cyanosis, clubbing, rash, R and LE thigh 1+ edema Neuro: alert & oriented x3    Labs: Basic Metabolic Panel: Recent Labs  Lab 01/18/24 1400 01/19/24 0453 01/20/24 0521 01/20/24 1510 01/21/24 0453 01/22/24 0358 01/22/24 1333 01/23/24 0404 01/23/24 0807 01/23/24 0808 01/23/24 0957 01/23/24 1832 01/23/24 1948  01/24/24 0432 01/25/24 0403  NA 127*   < > 129*   < > 131* 129*   < > 137   < > 134* 137 130*  --  138 139  K 4.1   < > 3.0*   < > 3.8 4.2   < > 3.1*   < > 3.2* 3.6 >7.5* 3.9 3.4* 2.8*  CL 76*   < > 74*   < > 79* 80*   < > 79*  --   --  79* 87*  --  79* 77*  CO2 40*   < > 43*   < > 39* 37*   < > 44*  --   --  42* 37*  --  42* 45*  GLUCOSE 110*   < > 137*   < > 161* 175*   < > 132*  --   --  183* 187*  --  170* 170*  BUN 66*   < > 76*   < > 79* 89*   < > 96*  --   --  97* 96*  --  103* 103*  CREATININE 1.77*   < > 1.97*   < > 2.18* 2.44*   < > 2.42*  --   --  2.45* 2.38*  --  2.33* 2.28*  CALCIUM 9.3   < > 9.3   < > 9.7 9.7   < > 10.0  --   --  10.3 9.1  --  10.3 10.5*  MG  --    < > 2.6*  --  2.7* 2.8*  --  2.8*  --   --   --   --   --  2.9*  --   PHOS 3.2  --   --   --   --   --   --   --   --   --   --   --   --   --   --    < > = values in this interval not displayed.   Liver Function Tests: Recent Labs  Lab 01/18/24 1400  ALBUMIN 3.1*    CBC: Recent Labs  Lab 01/21/24 0453 01/22/24 0358 01/23/24 0404 01/23/24 0807 01/23/24 0808 01/24/24 0432 01/25/24 0403  WBC 14.2* 15.5* 14.7*  --   --  15.8* 17.5*  HGB 13.4 13.4 12.7* 16.0 15.6 13.3 13.4  HCT 42.1 42.6 40.6 47.0 46.0 42.4 42.5  MCV 63.8* 63.3* 63.9*  --   --  64.4* 65.0*  PLT 155 160 152  --   --  118* 104*   Medications:    Scheduled Medications:  acetaZOLAMIDE   500 mg Oral BID   allopurinol   150 mg Oral Daily   aspirin  EC  81 mg Oral Daily   Chlorhexidine  Gluconate Cloth  6 each Topical Daily   feeding supplement  237 mL Oral TID WC & HS   fesoterodine   4 mg Oral QODAY   insulin  aspart  0-15 Units Subcutaneous TID WC   midodrine   15 mg Oral Q8H   potassium chloride   40 mEq Oral BID   tamsulosin   0.4 mg Oral QODAY   torsemide   60 mg Oral BID   Infusions:  amiodarone  30 mg/hr (01/25/24 0700)   milrinone  0.25 mcg/kg/min (01/25/24 0700)   PRN Medications: fentaNYL  (SUBLIMAZE ) injection, ondansetron   (ZOFRAN ) IV, mouth rinse, traZODone   Assessment/Plan:  1. Acute on chronic systolic CHF -> cardiogenic shock:  - Coronary angiography in 1/23  mild CAD.  - Echo 01/03/24  EF 25-30% RV mod reduced - Severe diuretic resistance; previously diuresed with lasix  60mg /hr with hypertonic saline during this admission.  - He was given hypertonic again 7/1 and 7/3 with excellent response.  RHC 7/2 showed filling pressures still elevated but not markedly.  -CVP is not elevated. Continue Torsemide  60 mg BID + diamox .  - Continue midodrine  to 15mg  TID -Now transitioned to comfort care. Stop milrinone . Stop CVP   2. Mechanical aortic valve: Continue warfarin and ASA 81. Goal INR 2.5-3.5.  - INR 2.9, discussed with pharmD. Stop coumadin  at discharge.   3. Paroxysmal Atrial fibrillation/atrial flutter:  - Admitted 1/18 with atrial fibrillation with RVR and CHF.  EF down to 20-25%.  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   - In cardiogenic shock in setting of recurrent AF.  - Failed DC-CV on 6/13 but went back to NSR.  -  Stop amio drip.  - As above stop coumadin  at discharge. Continue coumadin .   4. Ascending aortic aneurysm: 4.3 cm on 4/24 MRA chest.   5. AKI on CKD2: Creatinine 2.14 on admit. Baseline 1.5. Suspect cardio-renal.  - Creatinine stable.   6. Hypokalemia: Replace K.   7. Shock liver - likely 2/2 low output/hepatic congestion   8. Hyponatremia - Resolved, Na 138 today.    9. ILD - CT reviewed with P/CCM initially  10. DNR/DNI  Plan for residential hospice with [facility]. Transfer when bed available.   Transfer to 6  [address].  Length of Stay: 24   Greig Mosses, NP 01/25/2024, 8:58 AM  Advanced Heart Failure Team Pager [phone] (M-F; 7a - 4p). For after hours, see Amion for on call HF MD  Patient seen with NP, I formulated the plan and agree with the above note.   Patient remains on milrinone  0.25 with SBP 90s, creatinine stable at  2.28 with BUN 103.  I/Os net negative 3517, transitioned to torsemide  60 mg bid.  CVP 3 today.   He is in NSR on amiodarone .   Patient is very weak, awake but only periodically oriented.  Has a cough.   General: Weak Neck: No JVD, no thyromegaly or thyroid  nodule.  Lungs: Clear to auscultation bilaterally with normal respiratory effort. CV: Nondisplaced PMI.  Heart regular S1/S2 with mechanical S2, no S3/S4, 2/6 SEM RUSB.  1+ ankle edema.  Abdomen: Soft, nontender, no hepatosplenomegaly, no distention.  Skin: Intact without lesions or rashes.  Neurologic: Awake, not currently oriented to place/time.  Follow commands.  Extremities: No clubbing or cyanosis.  HEENT: Normal.   End stage HF with AKI. BUN/creatinine stably elevated.  He has been diuresed well at this point but still short of breath and coughing.  I suspect atelectasis with shallow breathing and possible aspiration.  Patient is confused and profoundly weak, has not been able to get out of bed.  Family meeting with palliative care yesterday, plan is now DNR/comfort care with goal to get him to inpatient hospice in [facility]  I think this is reasonable, I think that he has a very poor prognosis at this point.  - We will stop IV milrinone  and amiodarone .  - Continue torsemide  po and warfarin po.  - Comfort meds as needed.  - I will transfer him to 6N for now, can go to hospice house in [facility] when there is a bed available.   CRITICAL CARE Performed by: Ezra Shuck  Total critical care time: 35 minutes  Critical care time was exclusive of separately billable procedures and treating other patients.  Critical care was necessary to treat or prevent imminent or life-threatening deterioration.  Critical care was time spent personally by me on the following activities: development of treatment plan with patient and/or surrogate as well as nursing, discussions with consultants, evaluation of patient's response to treatment,  examination of patient, obtaining history from patient or surrogate, ordering and performing treatments and interventions, ordering and review of laboratory studies, ordering and review of radiographic studies, pulse oximetry and re-evaluation of patient's condition.  Ezra Shuck 01/25/2024 9:37 AM

## 2024-01-26 DIAGNOSIS — R570 Cardiogenic shock: Secondary | ICD-10-CM | POA: Diagnosis not present

## 2024-01-26 DIAGNOSIS — Z515 Encounter for palliative care: Secondary | ICD-10-CM | POA: Diagnosis not present

## 2024-01-26 DIAGNOSIS — I5021 Acute systolic (congestive) heart failure: Secondary | ICD-10-CM | POA: Diagnosis not present

## 2024-01-26 DIAGNOSIS — I5023 Acute on chronic systolic (congestive) heart failure: Secondary | ICD-10-CM | POA: Diagnosis not present

## 2024-01-26 LAB — PROTIME-INR
INR: 2.1 — ABNORMAL HIGH (ref 0.8–1.2)
Prothrombin Time: 24.1 s — ABNORMAL HIGH (ref 11.4–15.2)

## 2024-01-26 MED ORDER — FUROSEMIDE 10 MG/ML IJ SOLN
60.0000 mg | Freq: Two times a day (BID) | INTRAMUSCULAR | Status: DC
  Administered 2024-01-26 – 2024-01-28 (×5): 60 mg via INTRAVENOUS
  Filled 2024-01-26 (×5): qty 6

## 2024-01-26 MED ORDER — HYDROMORPHONE HCL 1 MG/ML IJ SOLN
0.5000 mg | Freq: Four times a day (QID) | INTRAMUSCULAR | Status: DC
  Administered 2024-01-26 – 2024-01-27 (×4): 0.5 mg via INTRAVENOUS
  Filled 2024-01-26 (×4): qty 0.5

## 2024-01-26 MED ORDER — WARFARIN SODIUM 2.5 MG PO TABS
2.5000 mg | ORAL_TABLET | Freq: Once | ORAL | Status: DC
  Filled 2024-01-26: qty 1

## 2024-01-26 NOTE — Progress Notes (Signed)
 Patient ID: Adrian Lambert, male   DOB: [birth year], 83 y.o.   MRN: [id number]  Advanced Heart Failure Rounding Note  AHF Cardiologist: Rolan Fuel, MD Chief Complaint: Cough  Subjective:   - Moved to ICU on 6/11 for cardiogenic shock.   - 6/21  lasix  drip increased to 60 mg per hour and  Hypertonic Saline added.  - 6/25 Hypertonic saline stopped. Given 15 mg  tolvaptan .  - 7/1 Hypertonic saline resumed, good UOP - 7/2 RHC: RA mean 14, RV 49/8, PA 53/24, mean 35, PCWP mean 19, Oxygen saturations:, PA 57%, AO 93%, Cardiac Output (Fick) 4.34 , Cardiac Index (Fick) 2.14  PVR 3.7 Wu, Cardiac Output (Thermo) 3.17 , Cardiac Index (Thermo) 1.56, PVR 5.04 Wu, PAPi 2  - 7/4: Comfort care.   Patient is sleeping, family at bedside.  Decreased secretions.   Objective:    Vital Signs:   Temp:  [98 F (36.7 C)] 98 F (36.7 C) (07/04 1558) Pulse Rate:  [74-116] 92 (07/05 0600) Resp:  [13-22] 16 (07/05 0600) BP: (84-185)/(68-163) 94/72 (07/05 0600) SpO2:  [93 %-96 %] 96 % (07/05 0456) Last BM Date : 01/23/24  Weight change: Filed Weights   01/23/24 0411 01/24/24 0452 01/25/24 0405  Weight: 81.5 kg 81.2 kg 81.6 kg   Intake/Output:  Intake/Output Summary (Last 24 hours) at 01/26/2024 1150 Last data filed at 01/26/2024 0950 Gross per 24 hour  Intake 10.81 ml  Output 1425 ml  Net -1414.19 ml   General: NAD Neck: JVP 8-9 cm, no thyromegaly or thyroid  nodule.  Lungs: Clear to auscultation bilaterally with normal respiratory effort. CV: Nondisplaced PMI.  Heart regular S1/S2 with mechanical S2, no S3/S4, 1/6 SEM RUSB.  1+ ankle edema.  Abdomen: Soft, nontender, no hepatosplenomegaly, no distention.  Skin: Intact without lesions or rashes.  Neurologic: Sleeping comfortably.  Extremities: No clubbing or cyanosis.  HEENT: Normal.   Labs: Basic Metabolic Panel: Recent Labs  Lab 01/20/24 0521 01/20/24 1510 01/21/24 0453 01/22/24 0358 01/22/24 1333 01/23/24 0404 01/23/24 0807  01/23/24 0957 01/23/24 1832 01/23/24 1948 01/24/24 0432 01/25/24 0403 01/25/24 1411  NA 129*   < > 131* 129*   < > 137   < > 137 130*  --  138 139 140  K 3.0*   < > 3.8 4.2   < > 3.1*   < > 3.6 >7.5* 3.9 3.4* 2.8* 2.8*  CL 74*   < > 79* 80*   < > 79*  --  79* 87*  --  79* 77* 77*  CO2 43*   < > 39* 37*   < > 44*  --  42* 37*  --  42* 45* >45*  GLUCOSE 137*   < > 161* 175*   < > 132*  --  183* 187*  --  170* 170* 124*  BUN 76*   < > 79* 89*   < > 96*  --  97* 96*  --  103* 103* 103*  CREATININE 1.97*   < > 2.18* 2.44*   < > 2.42*  --  2.45* 2.38*  --  2.33* 2.28* 2.14*  CALCIUM 9.3   < > 9.7 9.7   < > 10.0  --  10.3 9.1  --  10.3 10.5* 10.4*  MG 2.6*  --  2.7* 2.8*  --  2.8*  --   --   --   --  2.9*  --   --    < > = values in this  interval not displayed.   Liver Function Tests: No results for input(s): AST, ALT, ALKPHOS, BILITOT, PROT, ALBUMIN in the last 168 hours.   CBC: Recent Labs  Lab 01/21/24 0453 01/22/24 0358 01/23/24 0404 01/23/24 0807 01/23/24 0808 01/24/24 0432 01/25/24 0403  WBC 14.2* 15.5* 14.7*  --   --  15.8* 17.5*  HGB 13.4 13.4 12.7* 16.0 15.6 13.3 13.4  HCT 42.1 42.6 40.6 47.0 46.0 42.4 42.5  MCV 63.8* 63.3* 63.9*  --   --  64.4* 65.0*  PLT 155 160 152  --   --  118* 104*   Medications:    Scheduled Medications:  allopurinol   150 mg Oral Daily   aspirin  EC  81 mg Oral Daily   feeding supplement  237 mL Oral TID WC & HS   fesoterodine   4 mg Oral QODAY   furosemide   60 mg Intravenous BID   midodrine   15 mg Oral Q8H   potassium chloride   40 mEq Oral TID   scopolamine   1 patch Transdermal Q72H   tamsulosin   0.4 mg Oral QODAY   warfarin  2.5 mg Oral ONCE-1600   Warfarin - Pharmacist Dosing Inpatient   Does not apply q1600   Infusions:  sodium chloride  20 mL/hr at 01/25/24 2231   PRN Medications: acetaminophen  **OR** acetaminophen , artificial tears, fentaNYL  (SUBLIMAZE ) injection, glycopyrrolate  **OR** glycopyrrolate  **OR**  glycopyrrolate , LORazepam , ondansetron  **OR** ondansetron  (ZOFRAN ) IV, mouth rinse, traZODone   Assessment/Plan:   End stage HF with AKI, failure to thrive, delirium.  Family meeting with palliative care, now DNR/comfort care with goal to get him to inpatient hospice in [facility].  Having difficulty taking po meds.  - Continue comfort meds.  - Can transition diuretic to IV Lasix  boluses while here, would stop at discharge.  - Awaiting inpatient hospice bed, not available today.   Ezra Shuck 01/26/2024 11:50 AM

## 2024-01-26 NOTE — TOC Progression Note (Addendum)
 Transition of Care [facility]) - Progression Note    Patient Details  Name: Adrian Lambert MRN: [id number] Date of Birth: 01/15/1941  [facility]) CM/SW Contact  Dino CHRISTELLA Au, LCSWA Phone Number: 01/26/2024, 10:19 AM  Clinical Narrative:     SW left VM with [facility] [phone])  Update 1024am  SW received callback from [facility] ([facility]) reports not beds today. Will continue to follow   Expected Discharge Plan: Hospice Medical Facility Barriers to Discharge: Continued Medical Work up  Expected Discharge Plan and Services   Discharge Planning Services: CM Consult Post Acute Care Choice: Residential Hospice Bed Living arrangements for the past 2 months: Single Family Home                                       Social Determinants of Health (SDOH) Interventions SDOH Screenings   Food Insecurity: No Food Insecurity (01/02/2024)  Housing: Low Risk  (01/02/2024)  Transportation Needs: No Transportation Needs (01/02/2024)  Utilities: Not At Risk (01/02/2024)  Social Connections: Moderately Isolated (01/02/2024)  Tobacco Use: Low Risk  (01/02/2024)    Readmission Risk Interventions     No data to display

## 2024-01-26 NOTE — Progress Notes (Signed)
 Palliative Medicine Progress Note   Patient Name: Adrian Lambert       Date: 01/26/2024 DOB: 1941-01-04  Age: 83 y.o. MRN#: [id number] Attending Physician: Rolan Ezra RAMAN, MD Primary Care Physician: Okey Carlin Redbird, MD Admit Date: 01/01/2024  Reason for Consultation/Follow-up: {Reason for Consult:23484}  HPI/Patient Profile: 83 y.o. male   admitted on 01/01/2024 with past medical history significant for HFrEF, mechanical AVR MP AF on warfarin, ILD, BPH and HTN who presented to the ED at [facility] 6/10 for complaints of shortness of breath.  Workup was concerning for HFrEF exacerbation patient was admitted for treatment and stabilization   Patient currently in the ICU, continues with inotrope and vasopressor support.   Today is day 24 of this hospitalization, patient with poor response to full medical support.   Patient and family face treatment option decisions, advanced directive decisions and anticipatory care needs.  Subjective: Per MAR, patient has received 2 doses of prn fentanyl  in the past 24 hours.   Objective:  Physical Exam          Vital Signs: BP 94/72 (BP Location: Right Arm)   Pulse 92   Temp 98 F (36.7 C) (Axillary)   Resp 16   Ht 5' 10 (1.778 m)   Wt 81.6 kg   SpO2 96%   BMI 25.81 kg/m  SpO2: SpO2: 96 % O2 Device: O2 Device: Nasal Cannula O2 Flow Rate: O2 Flow Rate (L/min): 4 L/min  Intake/output summary:  Intake/Output Summary (Last 24 hours) at 01/26/2024 1451 Last data filed at 01/26/2024 0950 Gross per 24 hour  Intake --  Output 825 ml  Net -825 ml    LBM: Last BM Date : 01/23/24     Palliative Assessment/Data: ***     Palliative Medicine Assessment & Plan   Assessment: Principal Problem:   Cardiogenic shock (HCC) Active Problems:    Acute on chronic heart failure (HCC)   Acute CHF (congestive heart failure) (HCC)   Atrial flutter (HCC)   Pressure injury of skin    Recommendations/Plan: Continue comfort-focused care Transfer to inpatient hospice facility in [facility] when bed is available For excessive respiratory secretions:  increased prn Robinul  to 0.6 mg IV every 4 hours as needed Added scopolamine  patch Ongoing palliative support   Symptom Management:  Fentanyl  prn for pain or  dyspnea Lorazepam  (ATIVAN ) prn for anxiety Haloperidol (HALDOL) prn for agitation  Glycopyrrolate  (ROBINUL ) for excessive secretions Ondansetron  (ZOFRAN ) prn for nausea Polyvinyl alcohol  (LIQUIFILM TEARS) prn for dry eyes Antiseptic oral rinse (BIOTENE) prn for dry mouth  Goals of Care and Additional Recommendations: Limitations on Scope of Treatment: {Recommended Scope and Preferences:21019}  Code Status:   Prognosis:  {Palliative Care Prognosis:23504}  Discharge Planning: {Palliative dispostion:23505}  Care plan was discussed with ***  Thank you for allowing the Palliative Medicine Team to assist in the care of this patient.   ***   Recardo KATHEE Loll, NP   Please contact Palliative Medicine Team phone at [phone] for questions and concerns.  For individual providers, please see AMION.

## 2024-01-26 NOTE — Progress Notes (Signed)
 ANTICOAGULATION CONSULT NOTE  Pharmacy Consult for Warfarin Indication: afib with mechanical AVR  Allergies  Allergen Reactions   Celecoxib Rash    Other Reaction(s): Unknown    Patient Measurements: Height: 5' 10 (177.8 cm) Weight: 81.6 kg (179 lb 14.3 oz) IBW/kg (Calculated) : 73  Vital Signs: BP: 94/72 (07/05 0600) Pulse Rate: 92 (07/05 0600)  Labs: Recent Labs    01/24/24 0432 01/25/24 0403 01/25/24 1031 01/25/24 1411 01/26/24 0433  HGB 13.3 13.4  --   --   --   HCT 42.4 42.5  --   --   --   PLT 118* 104*  --   --   --   LABPROT 31.7*  --  31.7*  --  24.1*  INR 2.9*  --  2.9*  --  2.1*  CREATININE 2.33* 2.28*  --  2.14*  --     Estimated Creatinine Clearance: 27.5 mL/min (A) (by C-G formula based on SCr of 2.14 mg/dL (H)).   Medical History: Past Medical History:  Diagnosis Date   Anticoagulant long-term use    Arthritis    probably in my thumbs (06/26/2012)   Bifascicular block    Chronic combined systolic and diastolic CHF (congestive heart failure) (HCC)    a. 01/2016 Echo: EF 45-50%, gr2 DD, inflat AK (poor acoustic windows);  b. 07/2016 Echo: EF 25-30%, mild LVH, PASP (pt in Afib).   Complication of anesthesia    wake up w/a start; hallucinations (06/26/2012)   Critical Aortic Stenosis    a. 03/2003 s/p SJM #25 mech prosthetic AoV;  b. 07/2016 Echo: EF 25-30% (in setting of AF), AoV area 1.72 cm^2 (VTI), 1.75 cm^2 (Vmean).   Diverticulosis    Gouty arthritis    have had it in both feet, ankles, right knee (06/26/2012)   History of diverticulitis of colon    History of kidney stones    History of pancreatitis    Hypertension    Incomplete RBBB    PAF (paroxysmal atrial fibrillation) (HCC)    a. CHA2DS2VASc = 4-->chronic coumadin  in setting of mech AoV;  b. 07/2016 Recurrent AF RVR.    Medications:  Medications Prior to Admission  Medication Sig Dispense Refill Last Dose/Taking   acetaminophen  (TYLENOL ) 500 MG tablet Take 500 mg by mouth  2 (two) times daily.   01/01/2024   albuterol  (VENTOLIN  HFA) 108 (90 Base) MCG/ACT inhaler Inhale 2 puffs into the lungs every 4 (four) hours.   01/01/2024   allopurinol  (ZYLOPRIM ) 300 MG tablet Take 150 mg by mouth daily.   01/01/2024   aspirin  EC 81 MG tablet Take 81 mg by mouth daily. Swallow whole.   01/01/2024   clobetasol cream (TEMOVATE) 0.05 % Apply 1 Application topically 2 (two) times daily.   01/01/2024   colchicine  0.6 MG tablet Take 1 tablet (0.6 mg total) by mouth daily as needed (for gout).   Unknown   FARXIGA  10 MG TABS tablet TAKE 1 TABLET BY MOUTH EVERY DAY 30 tablet 6 01/01/2024   FIBER PO Take 2 capsules by mouth 2 (two) times daily.   01/01/2024   folic acid  (FOLVITE ) 1 MG tablet TAKE 1 TABLET(1 MG) BY MOUTH DAILY 90 tablet 3 01/01/2024   metoprolol  succinate (TOPROL -XL) 25 MG 24 hr tablet TAKE 1 TABLET BY MOUTH EVERYDAY AT BEDTIME 90 tablet 1 12/31/2023   potassium chloride  SA (KLOR-CON  M) 20 MEQ tablet Take 1 tablet (20 mEq total) by mouth daily.   01/01/2024   tamsulosin  (FLOMAX )  0.4 MG CAPS capsule Take 1 capsule (0.4 mg total) by mouth every other day. 90 capsule 3 01/01/2024   tolterodine  (DETROL  LA) 4 MG 24 hr capsule Take 1 capsule (4 mg total) by mouth every other day. Alternating days with the tamsulosin  90 capsule 3 12/31/2023   torsemide  (DEMADEX ) 20 MG tablet Take 3 tablets (60 mg total) by mouth daily.   01/01/2024   warfarin (COUMADIN ) 5 MG tablet Take 0.5-1 tablets (2.5-5 mg total) by mouth See admin instructions. 1/19 and 1/20  take 7.5mg  (1 and 1/2 tab) then take 1 tab (5mg ) daily until seen by MD  Take 1 tablet (5 mg) by mouth on Sundays, Mondays, Wednesdays, Thursdays & Saturdays in the evening. Take 0.5 tablet (2.5 mg) by mouth on Tuesdays & Fridays in the evening. (Patient taking differently: Take 2.5-5 mg by mouth See admin instructions. AS directed by PCP 2.5 mg Daily) 45 tablet 11 12/31/2023 at  8:00 PM   docusate sodium  (COLACE) 100 MG capsule Take 100 mg by mouth 2  (two) times daily. (Patient not taking: Reported on 01/01/2024)   Not Taking   polyethylene glycol powder (GLYCOLAX /MIRALAX ) 17 GM/SCOOP powder Take 17 g by mouth 2 (two) times daily as needed for moderate constipation or mild constipation. (Patient not taking: Reported on 12/26/2023) 238 g 2    Scheduled:   allopurinol   150 mg Oral Daily   aspirin  EC  81 mg Oral Daily   feeding supplement  237 mL Oral TID WC & HS   fesoterodine   4 mg Oral QODAY   furosemide   60 mg Intravenous BID   midodrine   15 mg Oral Q8H   potassium chloride   40 mEq Oral TID   scopolamine   1 patch Transdermal Q72H   tamsulosin   0.4 mg Oral QODAY   warfarin  2.5 mg Oral ONCE-1600   Warfarin - Pharmacist Dosing Inpatient   Does not apply q1600   Infusions:   sodium chloride  20 mL/hr at 01/25/24 2231    PRN: acetaminophen  **OR** acetaminophen , artificial tears, fentaNYL  (SUBLIMAZE ) injection, glycopyrrolate  **OR** glycopyrrolate  **OR** glycopyrrolate , LORazepam , ondansetron  **OR** ondansetron  (ZOFRAN ) IV, mouth rinse, traZODone   Assessment: 83 yo male presenting with worsening shortness of breath. On warfarin PTA for afib with mechanical AVR.    Per patient, dose is 5 mg daily, poor historian. Upon chart review patient should be on 1 mg Wed/Sat, 2.5 mg all other days per last PCP note on 12/18/2023. Goal INR is 2.5-3.5.   Pertinent DDI's - amio IV (none PTA, 6/12 >20, 7/2 >7/4), PO amio (200 mg 6/20 >6/23, 400 mg 6/23 >7/2), allopurinol  (PTA).   INR 2.9 today is therapeutic.  Dose held 7/1 in anticipation of RHC / uptrending INR and did not receive dose 7/3.  CBC stable (Hgb 13.4, pltc 104).  No bleeding noted.  IV amiodarone  restarted 7/2 post RHC, now stopping all together since going comfort care.  Plan to continue warfarin until discharge. Still with reduced oral intake over the past few days (~25% meals), receiving nutritional supplements (Ensure TID).    7/5- INR 2.1 Coumadin  dose on 7/4 not given  Goal of  Therapy:  INR 2.5-3.5 Monitor platelets by anticoagulation protocol: Yes   Plan:  Warfarin 2.5 mg x 1 (PTA regimen) Monitor for s/sx of bleeding, daily INR, CBC Watch for new DDIs and changes in appetite  Breylon Sherrow BS, PharmD, BCPS Clinical Pharmacist 01/26/2024 10:21 AM  Contact: [phone] after 3 PM  Be curious, not judgmental... -Davina Sprinkles

## 2024-01-27 DIAGNOSIS — I5021 Acute systolic (congestive) heart failure: Secondary | ICD-10-CM | POA: Diagnosis not present

## 2024-01-27 DIAGNOSIS — I5023 Acute on chronic systolic (congestive) heart failure: Secondary | ICD-10-CM | POA: Diagnosis not present

## 2024-01-27 DIAGNOSIS — R570 Cardiogenic shock: Secondary | ICD-10-CM | POA: Diagnosis not present

## 2024-01-27 DIAGNOSIS — Z515 Encounter for palliative care: Secondary | ICD-10-CM | POA: Diagnosis not present

## 2024-01-27 MED ORDER — HYDROMORPHONE HCL 1 MG/ML IJ SOLN
0.5000 mg | INTRAMUSCULAR | Status: DC
  Administered 2024-01-27 – 2024-01-28 (×7): 0.5 mg via INTRAVENOUS
  Filled 2024-01-27 (×7): qty 0.5

## 2024-01-27 NOTE — Plan of Care (Signed)
  Problem: Coping: Goal: Level of anxiety will decrease Outcome: Progressing   Problem: Elimination: Goal: Will not experience complications related to urinary retention Outcome: Progressing   Problem: Pain Managment: Goal: General experience of comfort will improve and/or be controlled Outcome: Progressing   Problem: Cardiovascular: Goal: Vascular access site(s) Level 0-1 will be maintained Outcome: Progressing   Problem: Education: Goal: Knowledge of General Education information will improve Description: Including pain rating scale, medication(s)/side effects and non-pharmacologic comfort measures Outcome: Not Progressing   Problem: Health Behavior/Discharge Planning: Goal: Ability to manage health-related needs will improve Outcome: Not Progressing   Problem: Clinical Measurements: Goal: Ability to maintain clinical measurements within normal limits will improve Outcome: Not Progressing Goal: Will remain free from infection Outcome: Not Progressing Goal: Diagnostic test results will improve Outcome: Not Progressing Goal: Respiratory complications will improve Outcome: Not Progressing Goal: Cardiovascular complication will be avoided Outcome: Not Progressing   Problem: Activity: Goal: Risk for activity intolerance will decrease Outcome: Not Progressing   Problem: Nutrition: Goal: Adequate nutrition will be maintained Outcome: Not Progressing   Problem: Elimination: Goal: Will not experience complications related to bowel motility Outcome: Not Progressing   Problem: Safety: Goal: Ability to remain free from injury will improve Outcome: Not Progressing   Problem: Skin Integrity: Goal: Risk for impaired skin integrity will decrease Outcome: Not Progressing   Problem: Education: Goal: Ability to describe self-care measures that may prevent or decrease complications (Diabetes Survival Skills Education) will improve Outcome: Not Progressing Goal:  Individualized Educational Video(s) Outcome: Not Progressing   Problem: Coping: Goal: Ability to adjust to condition or change in health will improve Outcome: Not Progressing   Problem: Fluid Volume: Goal: Ability to maintain a balanced intake and output will improve Outcome: Not Progressing   Problem: Health Behavior/Discharge Planning: Goal: Ability to identify and utilize available resources and services will improve Outcome: Not Progressing Goal: Ability to manage health-related needs will improve Outcome: Not Progressing   Problem: Metabolic: Goal: Ability to maintain appropriate glucose levels will improve Outcome: Not Progressing   Problem: Nutritional: Goal: Maintenance of adequate nutrition will improve Outcome: Not Progressing Goal: Progress toward achieving an optimal weight will improve Outcome: Not Progressing   Problem: Skin Integrity: Goal: Risk for impaired skin integrity will decrease Outcome: Not Progressing   Problem: Tissue Perfusion: Goal: Adequacy of tissue perfusion will improve Outcome: Not Progressing   Problem: Education: Goal: Understanding of CV disease, CV risk reduction, and recovery process will improve Outcome: Not Progressing Goal: Individualized Educational Video(s) Outcome: Not Progressing   Problem: Activity: Goal: Ability to return to baseline activity level will improve Outcome: Not Progressing   Problem: Cardiovascular: Goal: Ability to achieve and maintain adequate cardiovascular perfusion will improve Outcome: Not Progressing   Problem: Health Behavior/Discharge Planning: Goal: Ability to safely manage health-related needs after discharge will improve Outcome: Not Progressing

## 2024-01-27 NOTE — Progress Notes (Signed)
 Palliative Medicine Progress Note   Patient Name: Adrian Lambert       Date: 01/27/2024 DOB: 07/17/1941  Age: 83 y.o. MRN#: [id number] Attending Physician: Rolan Ezra RAMAN, MD Primary Care Physician: Okey Carlin Redbird, MD Admit Date: 01/01/2024  Reason for Consultation/Follow-up: end of life care, symptom management  HPI/Patient Profile: 83 y.o. male with past medical history significant for HFrEF, mechanical AVR MP AF on warfarin, ILD, BPH and HTN who presented to the ED at [facility] 6/10 for complaints of shortness of breath.  Patient was admitted 01/01/2024 with acute on chronic HFrEF.    Patient and family face treatment option decisions, advanced directive decisions and anticipatory care needs. After GOC discussion, patient was transitioned to comfort care.   Subjective: Chart reviewed. Patient assessed. He appears comfortable at this time. Respirations are even and unlabored. No excessive respiratory secretions noted.   Daughter/Wendy present at bedside. She reports patient appeared uncomfortable and was moaning prior to dose of scheduled dilaudid  was given at 11:00. Discussed increasing frequency of scheduled dilaudid ; she agrees.   Reviewed natural trajectory at end-of-life. Emotional support provided.    Objective:  Physical Exam Vitals reviewed.  Constitutional:      General: He is not in acute distress.    Appearance: He is ill-appearing.  Pulmonary:     Effort: No respiratory distress.  Neurological:     Mental Status: He is unresponsive.             Palliative Medicine Assessment & Plan   Assessment: Principal Problem:   Cardiogenic shock (HCC) Active Problems:   Acute on chronic heart failure (HCC)   Acute CHF (congestive heart failure) (HCC)   Atrial  flutter (HCC)   Pressure injury of skin    Recommendations/Plan: Continue comfort-focused care Increased frequency of scheduled dilaudid  0.5 mg IV to every 4 hours  Transfer to inpatient hospice facility in [facility] when bed is available - no bed today Ongoing palliative support   Symptom Management:  Continue scopolamine  patch  Fentanyl  prn for pain or dyspnea Lorazepam  (ATIVAN ) prn for anxiety Haloperidol (HALDOL) prn for agitation  Glycopyrrolate  (ROBINUL ) for excessive secretions Ondansetron  (ZOFRAN ) prn for nausea Polyvinyl alcohol  (LIQUIFILM TEARS) prn for dry eyes Antiseptic oral rinse (BIOTENE) prn for dry mouth   Code Status: DNR - comfort  Prognosis:  <  2 weeks  Discharge Planning: Hospice facility   Thank you for allowing the Palliative Medicine Team to assist in the care of this patient.   MDM - High   Recardo KATHEE Loll, NP   Please contact Palliative Medicine Team phone at [phone] for questions and concerns.  For individual providers, please see AMION.

## 2024-01-27 NOTE — Progress Notes (Signed)
 Patient ID: Adrian Lambert, male   DOB: 1940-08-13, 83 y.o.   MRN: [id number]  Advanced Heart Failure Rounding Note  [facility] Cardiologist: Ezra Shuck Chief Complaint: Cough  Subjective:   - Moved to ICU on 6/11 for cardiogenic shock.   - 6/21  lasix  drip increased to 60 mg per hour and  Hypertonic Saline added.  - 6/25 Hypertonic saline stopped. Given 15 mg  tolvaptan .  - 7/1 Hypertonic saline resumed, good UOP - 7/2 RHC: RA mean 14, RV 49/8, PA 53/24, mean 35, PCWP mean 19, Oxygen saturations:, PA 57%, AO 93%, Cardiac Output (Fick) 4.34 , Cardiac Index (Fick) 2.14  PVR 3.7 Wu, Cardiac Output (Thermo) 3.17 , Cardiac Index (Thermo) 1.56, PVR 5.04 Wu, PAPi 2  - 7/4: Comfort care.   Patient is sleeping, family at bedside.  Looks comfortable, minimal coughing.  Not responsive per family.   Objective:    Vital Signs:   Temp:  [97.9 F (36.6 C)] 97.9 F (36.6 C) (07/06 0354) Pulse Rate:  [96] 96 (07/06 0354) Resp:  [16] 16 (07/06 0354) BP: (78)/(63) 78/63 (07/06 0354) SpO2:  [80 %] 80 % (07/06 0354) Last BM Date : 01/23/24  Weight change: Filed Weights   01/23/24 0411 01/24/24 0452 01/25/24 0405  Weight: 81.5 kg 81.2 kg 81.6 kg   Intake/Output:  Intake/Output Summary (Last 24 hours) at 01/27/2024 1127 Last data filed at 01/27/2024 0600 Gross per 24 hour  Intake --  Output 500 ml  Net -500 ml   General: NAD, sleeping Neck: JVP 10 cm, no thyromegaly or thyroid  nodule.  Lungs: Clear to auscultation bilaterally with normal respiratory effort. CV: Nondisplaced PMI.  Heart regular S1/S2 with mechanical S2, no S3/S4, 1/6 SEM RUSB.  1+ ankle edema.  Abdomen: Soft, nontender, no hepatosplenomegaly, no distention.  Skin: Intact without lesions or rashes.  Neurologic: Not responsive.  Extremities: No clubbing or cyanosis.  HEENT: Normal.   Labs: Basic Metabolic Panel: Recent Labs  Lab 01/21/24 0453 01/22/24 0358 01/22/24 1333 01/23/24 0404 01/23/24 0807 01/23/24 0957  01/23/24 1832 01/23/24 1948 01/24/24 0432 01/25/24 0403 01/25/24 1411  NA 131* 129*   < > 137   < > 137 130*  --  138 139 140  K 3.8 4.2   < > 3.1*   < > 3.6 >7.5* 3.9 3.4* 2.8* 2.8*  CL 79* 80*   < > 79*  --  79* 87*  --  79* 77* 77*  CO2 39* 37*   < > 44*  --  42* 37*  --  42* 45* >45*  GLUCOSE 161* 175*   < > 132*  --  183* 187*  --  170* 170* 124*  BUN 79* 89*   < > 96*  --  97* 96*  --  103* 103* 103*  CREATININE 2.18* 2.44*   < > 2.42*  --  2.45* 2.38*  --  2.33* 2.28* 2.14*  CALCIUM 9.7 9.7   < > 10.0  --  10.3 9.1  --  10.3 10.5* 10.4*  MG 2.7* 2.8*  --  2.8*  --   --   --   --  2.9*  --   --    < > = values in this interval not displayed.   Liver Function Tests: No results for input(s): AST, ALT, ALKPHOS, BILITOT, PROT, ALBUMIN in the last 168 hours.   CBC: Recent Labs  Lab 01/21/24 9546 01/22/24 9641 01/23/24 0404 01/23/24 9192 01/23/24 9191 01/24/24 0432 01/25/24  0403  WBC 14.2* 15.5* 14.7*  --   --  15.8* 17.5*  HGB 13.4 13.4 12.7* 16.0 15.6 13.3 13.4  HCT 42.1 42.6 40.6 47.0 46.0 42.4 42.5  MCV 63.8* 63.3* 63.9*  --   --  64.4* 65.0*  PLT 155 160 152  --   --  118* 104*   Medications:    Scheduled Medications:  fesoterodine   4 mg Oral QODAY   furosemide   60 mg Intravenous BID    HYDROmorphone  (DILAUDID ) injection  0.5 mg Intravenous Q6H   scopolamine   1 patch Transdermal Q72H   tamsulosin   0.4 mg Oral QODAY   Infusions:   PRN Medications: acetaminophen  **OR** acetaminophen , artificial tears, fentaNYL  (SUBLIMAZE ) injection, glycopyrrolate  **OR** glycopyrrolate  **OR** glycopyrrolate , LORazepam , ondansetron  **OR** ondansetron  (ZOFRAN ) IV, mouth rinse, traZODone   Assessment/Plan:   End stage HF with AKI, failure to thrive, delirium.  Family meeting with palliative care, now DNR/comfort care with goal to get him to inpatient hospice in [facility].  Unable to take po meds.  - Continue comfort meds.  - On IV Lasix  boluses while here, would  stop at discharge.  - Awaiting inpatient hospice bed, hopefully today.   Ezra Shuck 01/27/2024 11:27 AM

## 2024-01-27 NOTE — Plan of Care (Signed)
  Problem: Elimination: Goal: Will not experience complications related to urinary retention Outcome: Progressing   Problem: Pain Managment: Goal: General experience of comfort will improve and/or be controlled Outcome: Progressing

## 2024-01-28 DIAGNOSIS — I5023 Acute on chronic systolic (congestive) heart failure: Secondary | ICD-10-CM | POA: Diagnosis not present

## 2024-01-30 ENCOUNTER — Telehealth (HOSPITAL_COMMUNITY): Payer: Self-pay | Admitting: Emergency Medicine

## 2024-01-30 NOTE — Telephone Encounter (Signed)
 Adrian Lambert is being discharged from Paramedicine due to admission to [facility].      Mary Sharps, EMT-Paramedic [phone] 01/30/2024

## 2024-02-22 NOTE — TOC Transition Note (Signed)
 Transition of Care [facility]) - Discharge Note   Patient Details  Name: Adrian Lambert MRN: [id number] Date of Birth: [birth year]  Transition of Care [facility]) CM/SW Contact:  Luise JAYSON Pan, LCSWA Phone Number: 02/04/2024, 1:33 PM   Clinical Narrative:   [facility] will [facility] to: [facility] Anticipated DC date: 2024-02-04  Family notified: Consuelo (spouse) Transport by: ROME   Per MD patient ready for DC to [facility]. RN to call report prior to discharge [phone]). RN, patient, patient's family, and facility notified of DC. Discharge Summary and FL2 sent to facility. DC packet on chart. Ambulance transport requested for patient at 1:24PM.   CSW will sign off for now as social work intervention is no longer needed. Please consult us  again if new needs arise.      Final next level of care: Hospice Medical Facility Barriers to Discharge: Barriers Resolved   Patient Goals and CMS Choice Patient states their goals for this hospitalization and ongoing recovery are:: wants to remain independent CMS Medicare.gov Compare Post Acute Care list provided to:: Patient Represenative (must comment) Choice offered to / list presented to : Spouse [place] ownership interest in [facility].provided to:: Spouse    Discharge Placement              Patient chooses bed at: Other - please specify in the comment section below: (Hospice home in [facility]) Patient to be transferred to facility by: [facility] Name of family member notified: Consuelo (spouse) Patient and family notified of of transfer: 04-Feb-2024  Discharge Plan and Services Additional resources added to the After Visit Summary for     Discharge Planning Services: CM Consult Post Acute Care Choice: Residential Hospice Bed                               Social Drivers of Health (SDOH) Interventions SDOH Screenings   Food Insecurity: No Food Insecurity (01/02/2024)  Housing: Low Risk  (01/02/2024)   Transportation Needs: No Transportation Needs (01/02/2024)  Utilities: Not At Risk (01/02/2024)  Social Connections: Moderately Isolated (01/02/2024)  Tobacco Use: Low Risk  (01/02/2024)     Readmission Risk Interventions     No data to display

## 2024-02-22 NOTE — Plan of Care (Signed)
   Problem: Elimination: Goal: Will not experience complications related to urinary retention Outcome: Progressing   Problem: Pain Managment: Goal: General experience of comfort will improve and/or be controlled Outcome: Progressing   Problem: Safety: Goal: Ability to remain free from injury will improve Outcome: Progressing

## 2024-02-22 NOTE — Progress Notes (Addendum)
 Patient ID: Adrian Lambert, male   DOB: 08-08-1940, 83 y.o.   MRN: [id number]  Advanced Heart Failure Rounding Note  [facility] Cardiologist: Beckey LITTIE Coe Chief Complaint: Cough  Subjective:   - Moved to ICU on 6/11 for cardiogenic shock.   - 6/21  lasix  drip increased to 60 mg per hour and  Hypertonic Saline added.  - 6/25 Hypertonic saline stopped. Given 15 mg  tolvaptan .  - 7/1 Hypertonic saline resumed, good UOP - 7/2 RHC: RA mean 14, RV 49/8, PA 53/24, mean 35, PCWP mean 19, Oxygen saturations:, PA 57%, AO 93%, Cardiac Output (Fick) 4.34 , Cardiac Index (Fick) 2.14  PVR 3.7 Wu, Cardiac Output (Thermo) 3.17 , Cardiac Index (Thermo) 1.56, PVR 5.04 Wu, PAPi 2  - 7/4: Comfort care.   Son at bedside. No needs at this time.   Objective:    Vital Signs:     Last BM Date : 01/27/24  Weight change: Filed Weights   01/23/24 0411 01/24/24 0452 01/25/24 0405  Weight: 81.5 kg 81.2 kg 81.6 kg   Intake/Output: No intake or output data in the 24 hours ending 2024/02/05 9180  Physical exam: Patient resting comfortably in bed. Full exam deferred as he is comfort care.   Labs: Basic Metabolic Panel: Recent Labs  Lab 01/22/24 0358 01/22/24 1333 01/23/24 0404 01/23/24 0807 01/23/24 0957 01/23/24 1832 01/23/24 1948 01/24/24 0432 01/25/24 0403 01/25/24 1411  NA 129*   < > 137   < > 137 130*  --  138 139 140  K 4.2   < > 3.1*   < > 3.6 >7.5* 3.9 3.4* 2.8* 2.8*  CL 80*   < > 79*  --  79* 87*  --  79* 77* 77*  CO2 37*   < > 44*  --  42* 37*  --  42* 45* >45*  GLUCOSE 175*   < > 132*  --  183* 187*  --  170* 170* 124*  BUN 89*   < > 96*  --  97* 96*  --  103* 103* 103*  CREATININE 2.44*   < > 2.42*  --  2.45* 2.38*  --  2.33* 2.28* 2.14*  CALCIUM 9.7   < > 10.0  --  10.3 9.1  --  10.3 10.5* 10.4*  MG 2.8*  --  2.8*  --   --   --   --  2.9*  --   --    < > = values in this interval not displayed.   Liver Function Tests: No results for input(s): AST, ALT, ALKPHOS, BILITOT, PROT,  ALBUMIN in the last 168 hours.   CBC: Recent Labs  Lab 01/22/24 0358 01/23/24 0404 01/23/24 0807 01/23/24 0808 01/24/24 0432 01/25/24 0403  WBC 15.5* 14.7*  --   --  15.8* 17.5*  HGB 13.4 12.7* 16.0 15.6 13.3 13.4  HCT 42.6 40.6 47.0 46.0 42.4 42.5  MCV 63.3* 63.9*  --   --  64.4* 65.0*  PLT 160 152  --   --  118* 104*   Medications:    Scheduled Medications:  fesoterodine   4 mg Oral QODAY   furosemide   60 mg Intravenous BID    HYDROmorphone  (DILAUDID ) injection  0.5 mg Intravenous Q4H   scopolamine   1 patch Transdermal Q72H   tamsulosin   0.4 mg Oral QODAY   Infusions:   PRN Medications: acetaminophen  **OR** acetaminophen , artificial tears, fentaNYL  (SUBLIMAZE ) injection, glycopyrrolate  **OR** glycopyrrolate  **OR** glycopyrrolate , LORazepam , ondansetron  **OR** ondansetron  (ZOFRAN ) IV,  mouth rinse, traZODone   Assessment/Plan:   End stage HF with AKI, failure to thrive, delirium.  Family meeting with palliative care, now DNR/comfort care with goal to get him to inpatient hospice in [facility].  Unable to take po meds.  - Continue comfort meds.  - On IV Lasix  boluses while here, would stop at discharge.  - Awaiting inpatient hospice bed. Discussing with HFSW team.   Beckey LITTIE Coe AGACNP-BC  02-05-2024 8:19 AM  Patient seen with NP, I formulated the plan and agree with the above note.   He is stable on comfort meds.  Sleeping. Not able to take pos.    Awaiting inpatient hospice placement.   Ezra Shuck 2024-02-05

## 2024-02-22 NOTE — Progress Notes (Signed)
 Called report to Deb at [facility].  Left in Central Venous Catheter due to request from Deb.

## 2024-02-22 NOTE — Discharge Summary (Incomplete)
 Advanced Heart Failure Team  Discharge Summary   Patient ID: Adrian Lambert MRN: [id number], DOB/AGE: Jan 04, 1941 83 y.o. Admit date: 01/01/2024 D/C date:     February 07, 2024   Primary Discharge Diagnoses:  1. Acute on chronic systolic CHF -> cardiogenic shock:  2. Mechanical aortic valve  3. Paroxysmal Atrial fibrillation/atrial flutter:  4. Ascending aortic aneurysm: 4.3 cm on 4/24 MRA chest.  5. AKI on CKD2  6. Hypokalemia  7. Shock liver 8. Hyponatremia 9. ILD 10. DNR/DNI    Hospital Course: [name] is a 83 y.o. male with a history of severe AS s/p [employer] mechanical AVR in 03/2003 on chronic coumadin , PAF,  prostate cancer, HTN, obesity, and chronic HFrEF.   Admitted with marked volume overload complicated by cardiogenic shock and A flutter. Prior to admit he had been declining over the last 6-8 months. Diuresed with lasix  drip with poor response. Mixed venous saturation low. Transferred to ICU. Placed on inotropes+ pressors. Unfortunately w/ severe diuretic resistance; previously diuresed with lasix  60mg /hr with hypertonic saline during this admission.  Gradually weaned off and midodrine  was added.   Unfortunately he continued to decline with recurrent A fib/shock. Given progressive decline, Palliative Care consulted. After meeting with family they elected DNR/DNI. Decision made to transition to comfort care and transfer to residential hospice. He was started on comfort meds. Inpatient hospice bed now available. Plan to d/c to [facility] in [facility] today.    Discharge Vitals: Blood pressure (!) 78/63, pulse 96, temperature 97.9 F (36.6 C), temperature source Axillary, resp. rate 16, height 5' 10 (1.778 m), weight 81.6 kg, SpO2 (!) 80%.  Labs: Lab Results  Component Value Date   WBC 17.5 (H) 01/25/2024   HGB 13.4 01/25/2024   HCT 42.5 01/25/2024   MCV 65.0 (L) 01/25/2024   PLT 104 (L) 01/25/2024    Recent Labs  Lab 01/25/24 1411  NA 140  K 2.8*  CL 77*  CO2 >45*   BUN 103*  CREATININE 2.14*  CALCIUM 10.4*  GLUCOSE 124*   Lab Results  Component Value Date   CHOL 147 11/21/2021   HDL 36 (L) 11/21/2021   LDLCALC 92 11/21/2021   TRIG 96 11/21/2021   BNP (last 3 results) Recent Labs    12/10/23 1619 12/25/23 0935 01/01/24 1124  BNP 3,213.0* 2,855.5* 3,066.5*    ProBNP (last 3 results) No results for input(s): PROBNP in the last 8760 hours.   Diagnostic Studies/Procedures   2D Echo 01/03/24 1. Left ventricular ejection fraction, by estimation, is 25 to 30%. The  left ventricle has severely decreased function. The left ventricle  demonstrates global hypokinesis. The left ventricular internal cavity size  was mildly to moderately dilated. Left  ventricular diastolic function could not be evaluated.   2. Right ventricular systolic function is moderately reduced. The right  ventricular size is severely enlarged. There is severely elevated  pulmonary artery systolic pressure. The estimated right ventricular  systolic pressure is 62.3 mmHg.   3. Left atrial size was moderately dilated.   4. Right atrial size was moderately dilated.   5. The mitral valve is degenerative. Mild mitral valve regurgitation.  Moderate mitral annular calcification.   6. The aortic valve has been repaired/replaced. Aortic valve  regurgitation is not visualized. There is a mechanical valve present in  the aortic position. Aortic valve mean gradient measures 5.6 mmHg.   7. Aortic dilatation noted. There is moderate dilatation of the ascending  aorta, measuring 45 mm.  8. The inferior vena cava is dilated in size with <50% respiratory  variability, suggesting right atrial pressure of 15 mmHg.   RHC 01/23/24 1. PCWP and RA pressure remain elevated but not markedly so.  2. 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  3. Low cardiac output, moderately low by Fick but severely low by thermodilution.  4. PAPi relatively preserved.   Right  Heart Pressures RHC Procedural Findings (milrinone  0.125): Hemodynamics (mmHg) RA mean 14 RV 49/8 PA 53/24, mean 35 PCWP mean 19  Oxygen saturations: PA 57% AO 93%  Cardiac Output (Fick) 4.34  Cardiac Index (Fick) 2.14  PVR 3.7 Wu  Cardiac Output (Thermo) 3.17  Cardiac Index (Thermo) 1.56 PVR 5.04 Wu  PAPi 2      Discharge Medications   Allergies as of 02/08/24       Reactions   Celecoxib Rash   Other Reaction(s): Unknown        Medication List     STOP taking these medications    acetaminophen  500 MG tablet Commonly known as: TYLENOL    albuterol  108 (90 Base) MCG/ACT inhaler Commonly known as: VENTOLIN  HFA   allopurinol  300 MG tablet Commonly known as: ZYLOPRIM    aspirin  EC 81 MG tablet   clobetasol cream 0.05 % Commonly known as: TEMOVATE   colchicine  0.6 MG tablet   docusate sodium  100 MG capsule Commonly known as: COLACE   Farxiga  10 MG Tabs tablet Generic drug: dapagliflozin  propanediol   FIBER PO   folic acid  1 MG tablet Commonly known as: FOLVITE    metoprolol  succinate 25 MG 24 hr tablet Commonly known as: TOPROL -XL   polyethylene glycol powder 17 GM/SCOOP powder Commonly known as: GLYCOLAX /MIRALAX    potassium chloride  SA 20 MEQ tablet Commonly known as: KLOR-CON  M   tamsulosin  0.4 MG Caps capsule Commonly known as: FLOMAX    tolterodine  4 MG 24 hr capsule Commonly known as: DETROL  LA   torsemide  20 MG tablet Commonly known as: DEMADEX    warfarin 5 MG tablet Commonly known as: COUMADIN         Disposition   The patient will be discharged in stable condition to residential hospice.  Discharge Instructions     Diet - low sodium heart healthy   Complete by: As directed    Increase activity slowly   Complete by: As directed           Duration of Discharge Encounter: Greater than 35 minutes   Signed, Caffie Marcine RIGGERS   02/08/24, 11:52 AM  Patient seen with PA, I formulated the plan and agree with the  above note.   Please see my earlier note from today with full details of discharge.   35 minutes of physician time spent arranging discharge to hospice facility.   Ezra Shuck 08-Feb-2024 1:13 PM

## 2024-02-22 NOTE — Plan of Care (Signed)
  Problem: Education: Goal: Knowledge of General Education information will improve Description: Including pain rating scale, medication(s)/side effects and non-pharmacologic comfort measures Outcome: Adequate for Discharge   Problem: Health Behavior/Discharge Planning: Goal: Ability to manage health-related needs will improve Outcome: Adequate for Discharge   Problem: Clinical Measurements: Goal: Ability to maintain clinical measurements within normal limits will improve Outcome: Adequate for Discharge Goal: Will remain free from infection Outcome: Adequate for Discharge Goal: Diagnostic test results will improve Outcome: Adequate for Discharge Goal: Respiratory complications will improve Outcome: Adequate for Discharge Goal: Cardiovascular complication will be avoided Outcome: Adequate for Discharge   Problem: Activity: Goal: Risk for activity intolerance will decrease Outcome: Adequate for Discharge   Problem: Nutrition: Goal: Adequate nutrition will be maintained Outcome: Adequate for Discharge   Problem: Coping: Goal: Level of anxiety will decrease Outcome: Adequate for Discharge   Problem: Elimination: Goal: Will not experience complications related to bowel motility Outcome: Adequate for Discharge Goal: Will not experience complications related to urinary retention Outcome: Adequate for Discharge   Problem: Pain Managment: Goal: General experience of comfort will improve and/or be controlled Outcome: Adequate for Discharge   Problem: Safety: Goal: Ability to remain free from injury will improve Outcome: Adequate for Discharge   Problem: Skin Integrity: Goal: Risk for impaired skin integrity will decrease Outcome: Adequate for Discharge   Problem: Education: Goal: Ability to describe self-care measures that may prevent or decrease complications (Diabetes Survival Skills Education) will improve Outcome: Adequate for Discharge Goal: Individualized Educational  Video(s) Outcome: Adequate for Discharge   Problem: Coping: Goal: Ability to adjust to condition or change in health will improve Outcome: Adequate for Discharge   Problem: Fluid Volume: Goal: Ability to maintain a balanced intake and output will improve Outcome: Adequate for Discharge   Problem: Health Behavior/Discharge Planning: Goal: Ability to identify and utilize available resources and services will improve Outcome: Adequate for Discharge Goal: Ability to manage health-related needs will improve Outcome: Adequate for Discharge   Problem: Metabolic: Goal: Ability to maintain appropriate glucose levels will improve Outcome: Adequate for Discharge   Problem: Nutritional: Goal: Maintenance of adequate nutrition will improve Outcome: Adequate for Discharge Goal: Progress toward achieving an optimal weight will improve Outcome: Adequate for Discharge   Problem: Skin Integrity: Goal: Risk for impaired skin integrity will decrease Outcome: Adequate for Discharge   Problem: Tissue Perfusion: Goal: Adequacy of tissue perfusion will improve Outcome: Adequate for Discharge   Problem: Education: Goal: Understanding of CV disease, CV risk reduction, and recovery process will improve Outcome: Adequate for Discharge Goal: Individualized Educational Video(s) Outcome: Adequate for Discharge   Problem: Activity: Goal: Ability to return to baseline activity level will improve Outcome: Adequate for Discharge   Problem: Cardiovascular: Goal: Ability to achieve and maintain adequate cardiovascular perfusion will improve Outcome: Adequate for Discharge Goal: Vascular access site(s) Level 0-1 will be maintained Outcome: Adequate for Discharge   Problem: Health Behavior/Discharge Planning: Goal: Ability to safely manage health-related needs after discharge will improve Outcome: Adequate for Discharge

## 2024-02-22 DEATH — deceased

## 2024-07-03 ENCOUNTER — Ambulatory Visit: Payer: Medicare Other | Admitting: Urology
